# Patient Record
Sex: Female | Born: 1950 | Race: White | Hispanic: No | Smoking: Former smoker
Health system: Southern US, Community
[De-identification: ages and names within clinical notes are randomized; demographics above are authoritative.]

## PROBLEM LIST (undated history)

## (undated) DIAGNOSIS — G4700 Insomnia, unspecified: Secondary | ICD-10-CM

## (undated) DIAGNOSIS — I2699 Other pulmonary embolism without acute cor pulmonale: Secondary | ICD-10-CM

## (undated) DIAGNOSIS — K219 Gastro-esophageal reflux disease without esophagitis: Secondary | ICD-10-CM

## (undated) DIAGNOSIS — F419 Anxiety disorder, unspecified: Secondary | ICD-10-CM

## (undated) DIAGNOSIS — F32A Depression, unspecified: Secondary | ICD-10-CM

## (undated) DIAGNOSIS — H9319 Tinnitus, unspecified ear: Secondary | ICD-10-CM

## (undated) DIAGNOSIS — R42 Dizziness and giddiness: Secondary | ICD-10-CM

## (undated) DIAGNOSIS — I712 Thoracic aortic aneurysm, without rupture, unspecified: Secondary | ICD-10-CM

## (undated) DIAGNOSIS — I10 Essential (primary) hypertension: Secondary | ICD-10-CM

## (undated) DIAGNOSIS — J449 Chronic obstructive pulmonary disease, unspecified: Secondary | ICD-10-CM

## (undated) DIAGNOSIS — K2980 Duodenitis without bleeding: Secondary | ICD-10-CM

## (undated) DIAGNOSIS — N189 Chronic kidney disease, unspecified: Secondary | ICD-10-CM

## (undated) DIAGNOSIS — M1990 Unspecified osteoarthritis, unspecified site: Secondary | ICD-10-CM

## (undated) DIAGNOSIS — I639 Cerebral infarction, unspecified: Secondary | ICD-10-CM

## (undated) DIAGNOSIS — E7800 Pure hypercholesterolemia, unspecified: Secondary | ICD-10-CM

## (undated) DIAGNOSIS — K5730 Diverticulosis of large intestine without perforation or abscess without bleeding: Secondary | ICD-10-CM

## (undated) DIAGNOSIS — F329 Major depressive disorder, single episode, unspecified: Secondary | ICD-10-CM

## (undated) DIAGNOSIS — I2510 Atherosclerotic heart disease of native coronary artery without angina pectoris: Secondary | ICD-10-CM

## (undated) HISTORY — DX: Tinnitus, unspecified ear: H93.19

## (undated) HISTORY — PX: CHOLECYSTECTOMY: SHX55

## (undated) HISTORY — DX: Insomnia, unspecified: G47.00

## (undated) HISTORY — DX: Other pulmonary embolism without acute cor pulmonale: I26.99

## (undated) HISTORY — PX: TUBAL LIGATION: SHX77

## (undated) HISTORY — DX: Gastro-esophageal reflux disease without esophagitis: K21.9

## (undated) HISTORY — DX: Thoracic aortic aneurysm, without rupture, unspecified: I71.20

## (undated) HISTORY — DX: Major depressive disorder, single episode, unspecified: F32.9

## (undated) HISTORY — DX: Chronic kidney disease, unspecified: N18.9

## (undated) HISTORY — DX: Anxiety disorder, unspecified: F41.9

## (undated) HISTORY — DX: Chronic obstructive pulmonary disease, unspecified: J44.9

## (undated) HISTORY — PX: ANGIOPLASTY: SHX39

## (undated) HISTORY — DX: Diverticulosis of large intestine without perforation or abscess without bleeding: K57.30

## (undated) HISTORY — DX: Essential (primary) hypertension: I10

## (undated) HISTORY — DX: Depression, unspecified: F32.A

## (undated) HISTORY — DX: Dizziness and giddiness: R42

## (undated) HISTORY — DX: Cerebral infarction, unspecified: I63.9

## (undated) HISTORY — DX: Thoracic aortic aneurysm, without rupture: I71.2

## (undated) HISTORY — DX: Duodenitis without bleeding: K29.80

## (undated) HISTORY — DX: Pure hypercholesterolemia, unspecified: E78.00

## (undated) HISTORY — DX: Atherosclerotic heart disease of native coronary artery without angina pectoris: I25.10

## (undated) HISTORY — DX: Unspecified osteoarthritis, unspecified site: M19.90

---

## 2000-05-21 ENCOUNTER — Ambulatory Visit (HOSPITAL_COMMUNITY): Admission: RE | Admit: 2000-05-21 | Discharge: 2000-05-21 | Payer: Self-pay

## 2000-05-21 ENCOUNTER — Encounter: Payer: Self-pay | Admitting: Family Medicine

## 2000-07-20 ENCOUNTER — Encounter: Payer: Self-pay | Admitting: Gastroenterology

## 2000-09-30 ENCOUNTER — Emergency Department (HOSPITAL_COMMUNITY): Admission: EM | Admit: 2000-09-30 | Discharge: 2000-09-30 | Payer: Self-pay | Admitting: Emergency Medicine

## 2000-09-30 ENCOUNTER — Encounter: Payer: Self-pay | Admitting: Emergency Medicine

## 2001-10-19 ENCOUNTER — Other Ambulatory Visit: Admission: RE | Admit: 2001-10-19 | Discharge: 2001-10-19 | Payer: Self-pay | Admitting: Family Medicine

## 2001-10-22 ENCOUNTER — Ambulatory Visit (HOSPITAL_COMMUNITY): Admission: RE | Admit: 2001-10-22 | Discharge: 2001-10-22 | Payer: Self-pay | Admitting: Family Medicine

## 2001-10-22 ENCOUNTER — Encounter: Payer: Self-pay | Admitting: Family Medicine

## 2002-11-11 ENCOUNTER — Encounter: Payer: Self-pay | Admitting: Family Medicine

## 2002-11-11 ENCOUNTER — Ambulatory Visit (HOSPITAL_COMMUNITY): Admission: RE | Admit: 2002-11-11 | Discharge: 2002-11-11 | Payer: Self-pay | Admitting: Family Medicine

## 2003-06-30 ENCOUNTER — Emergency Department (HOSPITAL_COMMUNITY): Admission: EM | Admit: 2003-06-30 | Discharge: 2003-07-01 | Payer: Self-pay | Admitting: Emergency Medicine

## 2003-07-27 ENCOUNTER — Encounter: Admission: RE | Admit: 2003-07-27 | Discharge: 2003-07-27 | Payer: Self-pay | Admitting: Otolaryngology

## 2003-12-15 ENCOUNTER — Ambulatory Visit (HOSPITAL_COMMUNITY): Admission: RE | Admit: 2003-12-15 | Discharge: 2003-12-15 | Payer: Self-pay | Admitting: Family Medicine

## 2004-04-16 ENCOUNTER — Ambulatory Visit: Payer: Self-pay | Admitting: Psychiatry

## 2004-07-03 ENCOUNTER — Ambulatory Visit: Payer: Self-pay | Admitting: Psychology

## 2004-09-05 ENCOUNTER — Ambulatory Visit: Payer: Self-pay | Admitting: Psychiatry

## 2004-10-10 ENCOUNTER — Ambulatory Visit: Payer: Self-pay | Admitting: Psychology

## 2004-12-12 ENCOUNTER — Ambulatory Visit: Payer: Self-pay | Admitting: Psychiatry

## 2005-01-15 ENCOUNTER — Ambulatory Visit: Payer: Self-pay | Admitting: Cardiology

## 2005-01-15 ENCOUNTER — Ambulatory Visit (HOSPITAL_COMMUNITY): Admission: RE | Admit: 2005-01-15 | Discharge: 2005-01-15 | Payer: Self-pay | Admitting: Nurse Practitioner

## 2005-01-27 ENCOUNTER — Ambulatory Visit: Payer: Self-pay

## 2005-01-27 ENCOUNTER — Ambulatory Visit: Payer: Self-pay | Admitting: Cardiology

## 2005-02-06 ENCOUNTER — Ambulatory Visit: Payer: Self-pay | Admitting: Psychiatry

## 2005-02-28 ENCOUNTER — Ambulatory Visit: Payer: Self-pay | Admitting: Psychiatry

## 2005-04-16 ENCOUNTER — Ambulatory Visit: Payer: Self-pay | Admitting: Psychology

## 2005-06-12 ENCOUNTER — Encounter: Admission: RE | Admit: 2005-06-12 | Discharge: 2005-06-12 | Payer: Self-pay | Admitting: Neurology

## 2005-06-26 ENCOUNTER — Ambulatory Visit: Payer: Self-pay | Admitting: Psychiatry

## 2005-07-07 ENCOUNTER — Ambulatory Visit: Payer: Self-pay | Admitting: Psychology

## 2005-08-08 ENCOUNTER — Encounter: Admission: RE | Admit: 2005-08-08 | Discharge: 2005-11-06 | Payer: Self-pay | Admitting: Family Medicine

## 2005-09-02 ENCOUNTER — Ambulatory Visit: Payer: Self-pay | Admitting: Psychiatry

## 2005-11-27 ENCOUNTER — Ambulatory Visit (HOSPITAL_COMMUNITY): Payer: Self-pay | Admitting: Psychiatry

## 2006-01-29 ENCOUNTER — Ambulatory Visit (HOSPITAL_COMMUNITY): Admission: RE | Admit: 2006-01-29 | Discharge: 2006-01-29 | Payer: Self-pay | Admitting: Nurse Practitioner

## 2006-02-26 ENCOUNTER — Ambulatory Visit (HOSPITAL_COMMUNITY): Payer: Self-pay | Admitting: Psychiatry

## 2006-05-26 ENCOUNTER — Ambulatory Visit (HOSPITAL_COMMUNITY): Payer: Self-pay | Admitting: Psychiatry

## 2006-08-25 ENCOUNTER — Ambulatory Visit (HOSPITAL_COMMUNITY): Payer: Self-pay | Admitting: Psychiatry

## 2006-11-24 ENCOUNTER — Ambulatory Visit (HOSPITAL_COMMUNITY): Payer: Self-pay | Admitting: Psychiatry

## 2006-12-21 ENCOUNTER — Inpatient Hospital Stay (HOSPITAL_COMMUNITY): Admission: EM | Admit: 2006-12-21 | Discharge: 2006-12-24 | Payer: Self-pay | Admitting: Emergency Medicine

## 2006-12-21 ENCOUNTER — Ambulatory Visit: Payer: Self-pay | Admitting: Cardiology

## 2006-12-22 ENCOUNTER — Encounter (INDEPENDENT_AMBULATORY_CARE_PROVIDER_SITE_OTHER): Payer: Self-pay | Admitting: Cardiovascular Disease

## 2007-01-25 ENCOUNTER — Ambulatory Visit: Payer: Self-pay | Admitting: Cardiology

## 2007-01-26 ENCOUNTER — Ambulatory Visit: Payer: Self-pay | Admitting: Gastroenterology

## 2007-01-27 ENCOUNTER — Encounter: Payer: Self-pay | Admitting: Gastroenterology

## 2007-01-27 ENCOUNTER — Ambulatory Visit: Payer: Self-pay | Admitting: Gastroenterology

## 2007-02-02 ENCOUNTER — Ambulatory Visit: Payer: Self-pay | Admitting: Gastroenterology

## 2007-02-08 ENCOUNTER — Ambulatory Visit (HOSPITAL_COMMUNITY): Admission: RE | Admit: 2007-02-08 | Discharge: 2007-02-08 | Payer: Self-pay | Admitting: Family Medicine

## 2007-02-09 ENCOUNTER — Ambulatory Visit (HOSPITAL_COMMUNITY): Payer: Self-pay | Admitting: Psychiatry

## 2007-06-01 ENCOUNTER — Ambulatory Visit (HOSPITAL_COMMUNITY): Payer: Self-pay | Admitting: Psychiatry

## 2007-06-16 ENCOUNTER — Encounter: Admission: RE | Admit: 2007-06-16 | Discharge: 2007-09-14 | Payer: Self-pay | Admitting: Anesthesiology

## 2007-07-16 ENCOUNTER — Ambulatory Visit: Payer: Self-pay | Admitting: Cardiology

## 2007-08-31 ENCOUNTER — Ambulatory Visit (HOSPITAL_COMMUNITY): Payer: Self-pay | Admitting: Psychiatry

## 2007-11-16 ENCOUNTER — Ambulatory Visit (HOSPITAL_COMMUNITY): Payer: Self-pay | Admitting: Psychiatry

## 2007-12-11 DIAGNOSIS — E785 Hyperlipidemia, unspecified: Secondary | ICD-10-CM

## 2007-12-11 DIAGNOSIS — F17200 Nicotine dependence, unspecified, uncomplicated: Secondary | ICD-10-CM | POA: Insufficient documentation

## 2007-12-11 DIAGNOSIS — I2510 Atherosclerotic heart disease of native coronary artery without angina pectoris: Secondary | ICD-10-CM

## 2007-12-11 DIAGNOSIS — K2981 Duodenitis with bleeding: Secondary | ICD-10-CM

## 2007-12-11 DIAGNOSIS — F329 Major depressive disorder, single episode, unspecified: Secondary | ICD-10-CM

## 2007-12-11 DIAGNOSIS — I10 Essential (primary) hypertension: Secondary | ICD-10-CM

## 2008-02-15 ENCOUNTER — Ambulatory Visit (HOSPITAL_COMMUNITY): Payer: Self-pay | Admitting: Psychiatry

## 2008-02-15 ENCOUNTER — Ambulatory Visit (HOSPITAL_COMMUNITY): Admission: RE | Admit: 2008-02-15 | Discharge: 2008-02-15 | Payer: Self-pay | Admitting: Obstetrics and Gynecology

## 2008-06-06 ENCOUNTER — Ambulatory Visit (HOSPITAL_COMMUNITY): Payer: Self-pay | Admitting: Psychiatry

## 2008-06-20 ENCOUNTER — Ambulatory Visit: Payer: Self-pay | Admitting: Gastroenterology

## 2008-06-20 DIAGNOSIS — Z9089 Acquired absence of other organs: Secondary | ICD-10-CM

## 2008-06-20 DIAGNOSIS — R079 Chest pain, unspecified: Secondary | ICD-10-CM

## 2008-06-20 DIAGNOSIS — K219 Gastro-esophageal reflux disease without esophagitis: Secondary | ICD-10-CM

## 2008-07-20 ENCOUNTER — Ambulatory Visit: Payer: Self-pay | Admitting: Gastroenterology

## 2008-07-31 ENCOUNTER — Telehealth (INDEPENDENT_AMBULATORY_CARE_PROVIDER_SITE_OTHER): Payer: Self-pay | Admitting: *Deleted

## 2008-08-21 ENCOUNTER — Ambulatory Visit: Payer: Self-pay | Admitting: Gastroenterology

## 2008-09-05 ENCOUNTER — Ambulatory Visit (HOSPITAL_COMMUNITY): Payer: Self-pay | Admitting: Psychiatry

## 2008-09-19 ENCOUNTER — Ambulatory Visit: Payer: Self-pay | Admitting: Cardiology

## 2008-09-21 ENCOUNTER — Ambulatory Visit: Payer: Self-pay

## 2008-12-07 ENCOUNTER — Ambulatory Visit (HOSPITAL_COMMUNITY): Payer: Self-pay | Admitting: Psychiatry

## 2008-12-20 ENCOUNTER — Telehealth (INDEPENDENT_AMBULATORY_CARE_PROVIDER_SITE_OTHER): Payer: Self-pay | Admitting: *Deleted

## 2009-02-01 ENCOUNTER — Ambulatory Visit (HOSPITAL_COMMUNITY): Admission: RE | Admit: 2009-02-01 | Discharge: 2009-02-01 | Payer: Self-pay | Admitting: General Surgery

## 2009-02-01 ENCOUNTER — Encounter (INDEPENDENT_AMBULATORY_CARE_PROVIDER_SITE_OTHER): Payer: Self-pay | Admitting: General Surgery

## 2009-02-02 ENCOUNTER — Telehealth: Payer: Self-pay | Admitting: Cardiology

## 2009-02-16 ENCOUNTER — Ambulatory Visit (HOSPITAL_COMMUNITY): Admission: RE | Admit: 2009-02-16 | Discharge: 2009-02-16 | Payer: Self-pay | Admitting: Obstetrics and Gynecology

## 2009-03-06 ENCOUNTER — Ambulatory Visit (HOSPITAL_COMMUNITY): Payer: Self-pay | Admitting: Psychiatry

## 2009-06-19 ENCOUNTER — Encounter: Payer: Self-pay | Admitting: Cardiology

## 2009-06-28 ENCOUNTER — Ambulatory Visit (HOSPITAL_COMMUNITY): Payer: Self-pay | Admitting: Psychiatry

## 2009-07-02 ENCOUNTER — Encounter (INDEPENDENT_AMBULATORY_CARE_PROVIDER_SITE_OTHER): Payer: Self-pay | Admitting: *Deleted

## 2009-09-27 ENCOUNTER — Ambulatory Visit (HOSPITAL_COMMUNITY): Payer: Self-pay | Admitting: Psychiatry

## 2009-10-29 ENCOUNTER — Ambulatory Visit: Payer: Self-pay | Admitting: Cardiology

## 2009-10-29 DIAGNOSIS — E663 Overweight: Secondary | ICD-10-CM

## 2009-12-20 ENCOUNTER — Ambulatory Visit (HOSPITAL_COMMUNITY): Payer: Self-pay | Admitting: Psychiatry

## 2010-02-18 ENCOUNTER — Ambulatory Visit (HOSPITAL_COMMUNITY): Admission: RE | Admit: 2010-02-18 | Discharge: 2010-02-18 | Payer: Self-pay | Admitting: Obstetrics and Gynecology

## 2010-03-19 ENCOUNTER — Ambulatory Visit (HOSPITAL_COMMUNITY): Payer: Self-pay | Admitting: Psychiatry

## 2010-04-18 ENCOUNTER — Ambulatory Visit (HOSPITAL_COMMUNITY): Payer: Self-pay | Admitting: Psychiatry

## 2010-07-23 ENCOUNTER — Ambulatory Visit (HOSPITAL_COMMUNITY): Payer: Self-pay | Admitting: Psychiatry

## 2010-09-01 ENCOUNTER — Encounter: Payer: Self-pay | Admitting: Family Medicine

## 2010-09-10 NOTE — Assessment & Plan Note (Signed)
Summary: F1Y   Visit Type:  Follow-up Primary Provider:  Mary Beth Dixon PAc  CC:  CAD and HTN.  History of Present Illness: The patient presents for one year followup. Since I last saw her she stopped smoking! She has had no new cardiovascular complaints. She was exercising routinely but stopped this when she had some family issues. She's just started getting back into walking and riding her stationary bicycle. With her levels of activity she does not have chest pressure, neck or arm discomfort. She does not have palpitations, presyncope or syncope. She does not have any PND or orthopnea. She thinks her breathing has gotten better off of the cigarettes.  Problems Prior to Update: 1)  Family Hx of Colonic Polyps  (ICD-V18.51) 2)  Cholecystectomy, Hx of  (ICD-V45.79) 3)  Gerd  (ICD-530.81) 4)  Chest Pain  (ICD-786.50) 5)  Hyperlipidemia  (ICD-272.4) 6)  Duodenitis, With Hemorrhage  (ICD-535.61) 7)  Tobacco Abuse  (ICD-305.1) 8)  Depression  (ICD-311) 9)  Hypertension  (ICD-401.9) 10)  Coronary Artery Disease  (ICD-414.00)  Current Medications (verified): 1)  Allegra 180 Mg Tabs (Fexofenadine Hcl) .... Take One By Mouth Once Daily 2)  Adult Aspirin Ec Low Strength 81 Mg Tbec (Aspirin) .... Take One By Mouth Once Daily 3)  Cartia Xt 120 Mg Xr24h-Cap (Diltiazem Hcl Coated Beads) .... Take One By Mouth Once Daily 4)  Simvastatin 40 Mg Tabs (Simvastatin) .... Take One By Mouth Once Daily 5)  Requip 1 Mg Tabs (Ropinirole Hcl) .... Take One By Mouth Once Daily 6)  Ambien Cr 12.5 Mg Cr-Tabs (Zolpidem Tartrate) .... Take One By Mouth At Bedtime 7)  Benazepril Hcl 40 Mg Tabs (Benazepril Hcl) .... Take One By Mouth Once Daily 8)  Hydrochlorothiazide 25 Mg Tabs (Hydrochlorothiazide) .Marland Kitchen.. 1 By Mouth Once Daily  Allergies: 1)  ! Morphine 2)  ! * Latex 3)  ! Adhesive Tape  Past History:  Past Medical History: HYPERLIPIDEMIA (ICD-272.4) DUODENITIS, WITH HEMORRHAGE (ICD-535.61) TOBACCO ABUSE  (ICD-305.1) DEPRESSION (ICD-311) HYPERTENSION (ICD-401.9) CORONARY ARTERY DISEASE (ICD-414.00)left main   normal, LAD 20% proximal and mid stenosis, circumflex 20% proximal and   distal stenosis, right coronary artery 50-60% stenosis in the mid   portion.  The EF of 65%.)  Past Surgical History: Cholecystecomy Tubal ligation  Review of Systems       As stated in the HPI and negative for all other systems.   Vital Signs:  Patient profile:   60 year old female Height:      64 inches Weight:      188 pounds BMI:     32.39 Pulse rate:   83 / minute BP sitting:   134 / 68  (left arm)  Vitals Entered By: Oswald Hillock (October 29, 2009 2:13 PM)  Physical Exam  General:  Well developed, well nourished, in no acute distress. Head:  normocephalic and atraumatic Eyes:  PERRLA/EOM intact; conjunctiva and lids normal. Mouth:  Teeth, gums and palate normal. Oral mucosa normal. Neck:  Neck supple, no JVD. No masses, thyromegaly or abnormal cervical nodes. Chest Wall:  no deformities or breast masses noted Lungs:  Clear bilaterally to auscultation and percussion. Heart:  Non-displaced PMI, chest non-tender; regular rate and rhythm, S1, S2 without murmurs, rubs or gallops. Carotid upstroke normal, no bruit. Normal abdominal aortic size, no bruits. Femorals normal pulses, no bruits. Pedals normal pulses. No edema, no varicosities. Abdomen:  Bowel sounds positive; abdomen soft and non-tender without masses, organomegaly, or hernias noted. No  hepatosplenomegaly. Msk:  Back normal, normal gait. Muscle strength and tone normal. Extremities:  No clubbing or cyanosis. Neurologic:  Alert and oriented x 3. Skin:  Intact without lesions or rashes. Cervical Nodes:  no significant adenopathy Axillary Nodes:  no significant adenopathy Inguinal Nodes:  no significant adenopathy Psych:  Normal affect.   EKG  Procedure date:  10/29/2009  Findings:      sinus rhythm, rate 83, axis within  normal limits, intervals within normal limits, no acute ST-T wave changes.  Impression & Recommendations:  Problem # 1:  CORONARY ARTERY DISEASE (ICD-414.00) The patient has nonobstructive disease. She's had no new symptoms. She's participating in risk reduction. No further testing is indicated at this point. I suggested that I might see her in 2 years and do a routine exercise treadmill test or sooner if she has any symptoms. Orders: EKG w/ Interpretation (93000)  Problem # 2:  HYPERLIPIDEMIA (ICD-272.4) I reviewed her lipid profile from November of last year with an HDL of 55 and an LDL of 61. This is excellent and she will continue the meds as listed.  Problem # 3:  OVERWEIGHT (ICD-278.02) I suggested plans for weight loss.  Problem # 4:  HYPERTENSION (ICD-401.9) Her blood pressure is controlled on the meds as listed. No change in therapy is indicated.  Patient Instructions: 1)  Your physician recommends that you schedule a follow-up appointment in: 2 years with Dr Antoine Poche 2)  Your physician recommends that you continue on your current medications as directed. Please refer to the Current Medication list given to you today.

## 2010-10-22 ENCOUNTER — Encounter (HOSPITAL_COMMUNITY): Payer: Medicare Other | Admitting: Psychiatry

## 2010-11-18 LAB — BASIC METABOLIC PANEL
BUN: 16 mg/dL (ref 6–23)
CO2: 30 mEq/L (ref 19–32)
Calcium: 10 mg/dL (ref 8.4–10.5)
GFR calc non Af Amer: 47 mL/min — ABNORMAL LOW (ref >60)
Glucose, Bld: 127 mg/dL — ABNORMAL HIGH (ref 70–99)
Potassium: 4.7 mEq/L (ref 3.5–5.1)

## 2010-11-18 LAB — DIFFERENTIAL
Basophils Relative: 1 % (ref 0–1)
Eosinophils Absolute: 0.5 10*3/uL (ref 0.0–0.7)
Lymphocytes Relative: 46 % (ref 12–46)
Monocytes Absolute: 0.9 10*3/uL (ref 0.1–1.0)
Monocytes Relative: 8 % (ref 3–12)
Neutro Abs: 4.5 10*3/uL (ref 1.7–7.7)
Neutrophils Relative %: 40 % — ABNORMAL LOW (ref 43–77)

## 2010-11-18 LAB — CBC: RBC: 4.58 MIL/uL (ref 3.87–5.11)

## 2010-12-09 ENCOUNTER — Other Ambulatory Visit: Payer: Self-pay | Admitting: Family Medicine

## 2010-12-09 ENCOUNTER — Ambulatory Visit
Admission: RE | Admit: 2010-12-09 | Discharge: 2010-12-09 | Disposition: A | Discharge status: Home / Self Care | Payer: Medicare Other | Source: Ambulatory Visit | Attending: Family Medicine | Admitting: Family Medicine

## 2010-12-09 DIAGNOSIS — M545 Low back pain, unspecified: Secondary | ICD-10-CM

## 2010-12-24 NOTE — Assessment & Plan Note (Signed)
[facility]                            CARDIOLOGY OFFICE NOTE   NAME:[name] [name], [name] [name]                      MRN:          [id number]  DATE:01/25/2007                            DOB:          23-Oct-1950    PRIMARY CARE PHYSICIAN:  Dionicio Stall, P.A. at [facility]   REASON FOR PRESENTATION:  Evaluate patient with chest pain on recent  catheterization.   HISTORY OF PRESENT ILLNESS:  The patient was hospitalized on May 12,  with chest discomfort. She ruled out for myocardial infarction. However,  because of the nature of the discomfort and risk factors, she did have a  cardiac catheterization. This demonstrated normal left main, LAD at 20%  proximal and mid stenosis, the circumflex had 20% proximal and distal  stenosis, the right coronary artery had a 50-60% stenosis in the mid  portion. There was thought to be some mild coronary spasm. The EF was  65%. The patient was managed medically and instructed to stop smoking.  Unfortunately, she did not quit smoking and still smoking a pack and a  half a day. She has had no further chest discomfort. She was nauseated  for about a week after the catheterization. She occasionally is getting  some chest fluttering. However, she is not having any pre-syncope or  syncope. She does activities such as vacuuming and working in her  garden, which she has done since the catheterization and has not been  able to bring on any chest pressure, neck or arm discomfort. She has had  no new shortness of breath, denies any PND or orthopnea.   PAST MEDICAL HISTORY:  1. Hypertension.  2. Nonobstructive coronary disease as described.  3. Tobacco abuse.  4. Depression.  5. Insomnia.  6. Cholecystectomy.  7. Tubal ligation.   ALLERGIES:  LATEX, GUAIFENESIN.   MEDICATIONS:  1. Effexor 75 mg daily.  2. Cymbalta 60 mg daily.  3. Allegra 180 mg daily.  4. Aspirin 81 mg daily.  5. Cartia 120 mg daily.  6.  Benazepril 10 mg daily.  7. Protonix 40 mg daily.  8. Simvastatin 40 mg daily.  9. Requip.  10.Ambien.   REVIEW OF SYSTEMS:  As stated in the HPI and otherwise negative for  other systems.   PHYSICAL EXAMINATION:  The patient is in no distress. Blood pressure is  157/81, heart rate 85 and regular, weight 181 pounds, Body Mass Index is  30.  HEENT: Eyelids unremarkable. Pupils equal, round, and reactive to light.  Fundi not visualized. Oral mucosa is unremarkable.  NECK: No jugular venous distention at 45 degrees.  Carotid upstroke  brisk and symmetrical. No bruits, no thyromegaly.  LYMPHATICS: No cervical, axillary or inguinal adenopathy.  LUNGS: Clear to auscultation bilaterally.  BACK: No costovertebral angle tenderness.  CHEST: Unremarkable.  HEART: PMI not displaced or sustained. S1, S2 within normal limits. No  S3. No S4. No clicks, rub or murmurs.  ABDOMEN: Flat, positive bowel sounds, normal in frequency and pitch. No  bruits. No rebound. No guarding. No midline pulsatile mass. No  hepatomegaly, splenomegaly.  SKIN:  No rashes, no nodules.  EXTREMITIES: 2+ pulses. Right groin without hematoma, ecchymosis or  bruit.  NEURO: Oriented to person, place and time. Cranial nerves II-XII grossly  intact. Motor grossly intact throughout.   EKG:  Sinus rhythm, rate 87, rightward axis, poor anterior R-wave  progression suggestive of an old anterior septal myocardial infarction.  No acute ST wave changes.   ASSESSMENT/PLAN:  1. Nonobstructive coronary disease. The patient is not having any new      symptoms. She will continue with aggressive risk reduction. No      further cardiovascular testing is suggested.  2. Tobacco: I discussed the need to stop smoking (less 10 minutes). I      did give her a prescription for Chantix. We discussed the black box      warning of suicidal ideation. We discussed other side effects. I      ran all of her medications against the Chantix and found  no drug      interaction. She was given a starter prescription for this.  3. Hypertension. Blood pressure is slightly elevated today. She      recently had benazepril added. I have asked her to keep a blood      pressure diary which she says that her blood pressures at home have      been in the 130s. She is going to take that diary to her primary      care physician for further medication adjustments. She should also      lose weight to help bring her blood pressure to target.  4. Followup. Because of the above events, I would like to see her      again in 6 months or sooner if needed.     Rollene Rotunda, MD, [facility]  Electronically Signed    JH/MedQ  DD: 01/25/2007  DT: 01/25/2007  Job #: [id number]   cc:   Dionicio Stall, P.A.

## 2010-12-24 NOTE — Consult Note (Signed)
NAMELAURELL, [name] [name]               ACCOUNT NO.:  [id number]   MEDICAL RECORD NO.:  [id number]          PATIENT TYPE:  INP   LOCATION:  4733                         FACILITY:  [facility]   PHYSICIAN:  Rollene Rotunda, MD, FACCDATE OF BIRTH:  [birth year]   DATE OF CONSULTATION:  12/22/2006  DATE OF DISCHARGE:                                 CONSULTATION   PRIMARY CARDIOLOGIST:  Dr. Rollene Rotunda.   PRIMARY CARE Maiyah Goyne:  Nadara Mustard, Nurse Practitioner at [facility].   REASON FOR CONSULTATION:  Chest pain.   HISTORY OF PRESENT ILLNESS:  [age]-year-old Caucasian female with  no prior history of coronary artery disease, presented to her primary  care physician's office at [facility] on Dec 21, 2006,  secondary to heaviness in her chest with associated shortness of breath  and nausea.  The patient was also complaining of being hot, then cold.  The patient states the symptoms actually began 2 days prior with some  low-grade nausea and burning in her chest and awoke the morning of Dec 21, 2006 around 7 a.m. with some tightness in her chest, radiating to  her neck.  Her states, around 10 a.m., she began to have trouble taking  deep breaths.  She was not short of breath with activity, only when she  tried to take a deep breath it was difficult for her.  She had not  experienced this difficulty before.  She called her primary care  [facility] Penton's office, she was seen there and an EKG was completed.  She was  given sublingual nitroglycerin and baby aspirin and EMS was called.  EMS  did give her an additional nitroglycerin in route and the pain  diminished.  The pain also was further alleviated on arrival to the  emergency room with use of another sublingual nitroglycerin.  The  patient was admitted by incompass and cardiac enzymes were cycled.  EKG  revealed normal sinus rhythm with nonspecific anterior T wave changes.  The patient's initial point of  care markers were negative.   On today's evaluation, the patient continues to have some occasional  chest discomfort, although not severe.  The patient's troponins were  found to be negative x2.  The patient has just returned from receiving  echocardiogram with results pending.   REVIEW OF SYSTEMS:  All other systems reviewed and are found to be  negative.   PAST MEDICAL HISTORY:  Includes:  1. Hypercholesterolemia.  2. Chronic back pain.  3. Hypertension.  4. Depression.  5. Insomnia.  6. Borderline diabetes.   PAST CARDIAC WORKUP:  The patient was seen by Dr. Rollene Rotunda on June  of 2006 at the request of Nadara Mustard, family nurse practitioner,  secondary to palpitations.  The patient did receive a stress Myoview  revealing no chest pain, but positive electrocardiographic response.  She was found to have some soft tissue attenuation versus prior septal  infarct.  There was no ischemia noted on the study.  Her gated ejection  fraction was 72% with normal wall motion.   PAST SURGICAL HISTORY:  She  had:  1. A cholecystectomy.  2. Bilateral tubal ligation.   SOCIAL HISTORY:  She lives in [place].  She is 1 and 1-1/2 pack a  day smoker x20 years.  Negative for ETOH.  Negative for drug use.  Exercises very little.  She is not on a special diet; however, she does  watch her carbohydrates as she has been told she was a diabetic,  borderline.   FAMILY HISTORY:  Mother died at age 62 with complications from diabetes,  hypertension.  She also had rheumatoid arthritis.  Father died at age 28  and had been diagnosed with CAD in his 42s.   CURRENT MEDICATIONS:  1. Norvasc 2.5 mg once a day.  2. Lopressor 12.5 mg b.i.d.  3. Zocor 20 mg daily.  4. Claritin 10 mg daily.  5. Cymbalta 60 mg daily.  6. Lovenox 40 mg daily.  7. Aspirin 325 daily.  8. Protonix 40 mg daily.  9. Albuterol 2 puffs q.i.d.  10.Senokot q.h.s.  11.Requip 1 mg daily.   ALLERGIES:  TO:  1.  GUAIFENESIN.  2. LATEX POWDER.   CURRENT LABS:  Hemoglobin 15.6, hematocrit 46.0, white blood cell is  8.4, platelets 356.  Sodium 137, potassium 3.9, chloride of 105, CO2  40.8, BUN 10, creatinine 0.8, glucose 134.  TSH 1.060.  D-dimer negative  at 0.73.  BNP less than 30.0.  Hemoglobin A1c 6.5.  Troponin 0.01 and  0.02 respectively.  Cholesterol 104, triglycerides 97, HDL 35, LDL 50.  Chest x-ray revealing low lung volumes with bibasilar subsegmental  atelectasis.  CT scan of the chest was negative for PE, but revealed  right middle lobe atelectasis.  EKG revealing normal sinus rhythm with  78 beats per minute with a right axis deviation and nonspecific T wave  abnormalities noted in V1 and V2.   PHYSICAL EXAMINATION:  VITAL SIGNS:  Blood pressure 127/66.  Pulse 69.  Respirations 20.  Temperature 97.8.  T-Max is 98.3.  Saturation 97% on 2  liters.  Weight 189.5 pounds.  HEENT:  Head is normocephalic, atraumatic.  Eyes:  PERRLA.  Mucous  membranes and mouth pink and moist.  Tongue is midline.  NECK:  Supple.  There was no JVD.  There are carotid bruits noted on the  right.  There is no JVD.  CARDIOVASCULAR:  Regular rate and rhythm with 1/6 systolic murmur  auscultated.  Pulses are 2+ bilaterally, radially and dorsalis pedis  pulses.  LUNGS:  Essentially clear to auscultation.  ABDOMEN:  Soft.  There is some mild left lower quadrant tenderness to  palpation.  EXTREMITIES:  Without clubbing, cyanosis or edema.  NEURO:  Cranial nerves II-XII are grossly intact with equal strength in  all extremities.   CARDIOVASCULAR RISK FACTORS:  Tobacco abuse, hypertension,  hypercholesterolemia and family history.   ASSESSMENT/PLAN:  1. Atypical chest pain.  2. Hypercholesterolemia by history.  3. Tobacco abuse.  4. Hypertension, well controlled.   PLAN:  The patient has been examined by myself and Dr. Valera Castle. Signs and symptoms consistent with unstable angina, yet has no  myocardial  infarction or positive cardiac enzymes despite chest pain and  symptoms all day and negative EKG with evidence to be within normal  limits.  However, with right carotid bruit, tobacco and elevated lipids  and diabetes mellitus x2, the patient needs a cath to rule out  obstructive coronary artery disease and outpatient carotid Dopplers.   This has been discussed thorough with the patient to  include risks and  benefits per Dr. Daleen Squibb.  The patient agrees to proceed.  We will schedule  cardiac catheterization the morning of Dec 23, 2006 and make further  recommendations throughout hospital course depending upon results.  Carotid Dopplers can be completed as an outpatient.      Bettey Mare. Lyman Bishop, NP      Rollene Rotunda, MD, [facility]  Electronically Signed    KML/MEDQ  D:  12/22/2006  T:  12/22/2006  Job:  [id number]   cc:   Nadara Mustard, [facility]

## 2010-12-24 NOTE — Discharge Summary (Signed)
[name] [name]               ACCOUNT NO.:  [id number]   MEDICAL RECORD NO.:  [id number]          PATIENT TYPE:  INP   LOCATION:  4733                         FACILITY:  [facility]   PHYSICIAN:  Isidor Holts, M.D.  DATE OF BIRTH:  May 26, 1951   DATE OF ADMISSION:  12/21/2006  DATE OF DISCHARGE:  12/24/2006                         DISCHARGE SUMMARY - REFERRING   PRIMARY MEDICAL DOCTOR:  [facility].   NURSE PRACTITIONER:  Nadara Mustard.   DISCHARGE DIAGNOSES:  1. Chest pain/nonobstructive coronary artery disease.  2. Smoking history.  3. Hypertension.  4. Dyslipidemia.  5. Moderate obesity.   DISCHARGE MEDICATIONS:  1. Aspirin 325 mg p.o. daily.  2. Cardizem CD 120 mg p.o. daily.  3. Cymbalta 60 mg p.o. daily.  4. Ambien 10 mg p.o. p.r.n. nightly.  5. Zocor 20 mg p.o. daily.  6. Allegra 180 mg daily.  7. Requip 1 mg p.o. daily.   PROCEDURES:  1. Portable chest x-ray dated Dec 21, 2006, showed lower lung volumes      with bibasilar subsegmental atelectasis.  2. Chest CT angiogram dated Dec 21, 2006, showed no evidence for acute      pulmonary embolism.  There was right middle lobe atelectasis.  3. A 2-D echocardiogram  dated Dec 22, 2006, showed overall normal      left ventricular systolic function, LVEF 55%, no  left ventricular      regional wall motion abnormalities.  Doppler parameters were      consistent with elevated mean left atrial filling pressure,      estimated peak pulmonary artery pressure 30 mmHg.  4. Cardiac catheterization dated Dec 23, 2006, performed by Dr. Nona Dell, that showed predominantly mild coronary atherosclerosis      with moderate disease in the proximal right coronary artery over      approximately 50%.  No critical stenoses are noted.  LVEF was      approximately 65% with left ventricular end diastolic pressure 15      mmHg, no significant mitral regurgitation.   CONSULTATIONS:  1. Rollene Rotunda, MD,  [facility], cardiologist.  2. Jonelle Sidle, MD, cardiologist.   ADMISSION HISTORY:  As in H&P notes of Dec 21, 2006, dictated by Dr.  Hettie Holstein.  However, in brief, this is a 61 year old female with  known history of dyslipidemia, chronic back pain/DJD/radiculopathy,  depression, restless leg syndrome, smoking history, status post prior  cholecystectomy and bilateral tubal ligation who presents with chest  heaviness and shortness of breath.  She was seen at her primary care  physician's office, received sublingual nitroglycerin with relief, then  referred to the emergency department.  EKG revealed normal sinus rhythm,  age indeterminate anterior infarct, elevated D-dimer at 0.753.  She was  therefore admitted for further evaluation, investigation and management.   CLINICAL COURSE:  PROBLEM #1 -  CHEST PAIN:  Patient certainly has risk  factors for coronary artery disease, including obesity, hypertension,  dyslipidemia, smoking history and a positive family history of coronary  artery disease.  She was therefore a candidate for  further work-up.  Cardiac enzymes remained unelevated.  EKG showed no acute ischemic  changes.  Patient underwent chest CT angiogram which showed no evidence  of pulmonary embolism and no acute pulmonary pathology.  Cardiology  consultation was called, which was kindly provided by Dr. Rollene Rotunda, who arranged a cardiac catheterization on Dec 23, 2006, which  showed nonobstructive coronary artery disease, although there was  moderate disease in the proximal right coronary artery of approximately  50%.  It is postulated that patient may have been manifesting some  angina secondary to coronary artery spasm on the basis of mild to  moderate coronary atherosclerosis.  Medical management was recommended  for now and certainly smoking cessation.  Patient has therefore, been  placed on Aspirin and per cardiology recommendation, Cardizem CD. She  had no further  episodes of chest pain during the course of her  hospitalization.   PROBLEM #2 -  SMOKING HISTORY:  Patient has been counseled  appropriately.   PROBLEM #3 -  HYPERTENSION:  This remained controlled throughout  patient's hospitalization.   PROBLEM #4 -  DYSLIPIDEMIA:  Patient's lipid profile was as follows.  Total cholesterol 104, triglycerides 97, HDL 35, LDL 50.  TSH was 1.061.  ie, excellent lipid profile.  The patient continues on pre-admission  dosages of Zocor.   DISPOSITION:  Patient was considered sufficiently clinically stable and  asymptomatic to be discharged in satisfactory condition on Dec 24, 2006.  During the course of her hospitalization, there were no problems  referable to her restless leg syndrome, mood or chronic back pain.  She  is recommended heart-healthy diet, activity as tolerated.   FOLLOW UP INSTRUCTIONS:  She has been recommended follow-up with Dr.  Rollene Rotunda at the [place], office on January 25, 2007, at 2:30 p.m., telephone number [phone].  She is also to  follow up routinely with her primary M.D. per prior scheduled  appointment.   Note, Norvasc has been discontinued per cardiology recommendations.   All this has been communicated to the patient, who verbalized  understanding.      Isidor Holts, M.D.  Electronically Signed     CO/MEDQ  D:  12/24/2006  T:  12/24/2006  Job:  [id number]   cc:   Nadara Mustard, N.P.  Rollene Rotunda, MD, [facility]

## 2010-12-24 NOTE — H&P (Signed)
[name] [name], [name] [name]               ACCOUNT NO.:  [id number]   MEDICAL RECORD NO.:  [id number]          PATIENT TYPE:  INP   LOCATION:  4733                         FACILITY:  [facility]   PHYSICIAN:  Hettie Holstein, D.O.    DATE OF BIRTH:  1951/05/23   DATE OF ADMISSION:  12/21/2006  DATE OF DISCHARGE:                              HISTORY & PHYSICAL   PRIMARY CARE PHYSICIAN:  [facility].   CHIEF COMPLAINT:  Chest heaviness.   HISTORY OF PRESENTING ILLNESS:  [name] [name] is a pleasant, morbidly  obese Caucasian female who has a history of hyperlipidemia,  hypertension, is a positive smoker, and has positive coronary disease in  her family, who developed some indigestion yesterday.  This persisted,  and she awoke this morning with some chest heaviness and some septal  shortness of breath.  Not particularly associated with exertion.  She  went to [employer] today, and around 11 a.m. this became more noticeable,  and she went home and tried to rest.  This did not go away.  She went to  her primary physician's office.  Saw Cheron Every.  Received 2  nitroglycerin, with relief.  There was no radiation to her left arm, and  no jaw radiation.  She has been having hot and cold sweats, though this  is not new.  Apparently she had a stress test about 18 months ago at  [facility], for which I do not have the results at this time,  but she stated that this was a treadmill, and the study was negative.   EMERGENCY ROOM COURSE:  EKG in the emergency room revealed normal sinus  rhythm, anterior infarct, age indeterminate.  Her point of care marker  was negative.  D-dimer was elevated at 14, and underwent a CT scan to  rule out PE.  This was negative, as well.  There was revealed some right  middle lobe atelectasis.   PAST MEDICAL HISTORY:  Significant for:  1. Hyperlipidemia.  2. Status post cholecystectomy.  3. Bilateral tubal ligation.  4.  Chronic back pain and degenerative disk disease.  She states she      was told she has a pinched nerve.  Apparently she has undergone      some MRIs in the past.  5. History of vertigo.  6. History of insomnia.  7. Depression.  8. Restless legs syndrome.  9. Positive for smoking as well.   ALLERGIES:  1. GUAIFENESIN.  2. LATEX.   MEDICATIONS:  1. Cymbalta 60 mg daily.  2. Ambien 10 mg nightly.  3. Aspirin 325 mg daily.  4. Norvasc 5 mg daily.  5. Zocor 20 mg daily.  6. Allegra 180 mg daily.  7. Requip 1 mg p.o. every day.   SOCIAL HISTORY:  She does smoke sometimes greater than a pack of  cigarettes per day.  This is longstanding.  She denies alcohol.  She has  been married for 37 years.  Formerly worked as a Scientist, forensic at [employer].  She has 1 son.  She has some  psychosocial stressors recently,  as she is keeping her grandchildren, and she is worried about her son's  economic independence.   FAMILY HISTORY:  Mother died at age 70.  She suffered from diabetes,  hypertension, rheumatoid arthritis.  Her father died at age 68.  He did  suffer his first heart attack before age 59 and died with heart disease.   REVIEW OF SYSTEMS:  She states that she has had loss of appetite for the  past 3 days, and some weight loss that she estimates to be 3-4 pounds.  She has had some problems with constipation and hemorrhoids that have  been resolved with stool softeners.  She has had some nausea, but no  vomiting.  No vaginal discharge.  She reports having a colonoscopy  approximately 4 years ago __________.  She had her mammogram this past  fall.  She states that her mammograms have been normal otherwise.  She  has had no swelling of the lower extremities.  No blood in her stools.  Review of systems further is unremarkable.   PHYSICAL EXAMINATION:  VITAL SIGNS:  In the emergency department her  vital signs were stable, with a blood pressure of 110/58.  Heart rate  73.  Respirations  18.  O2 saturation 99%.  HEENT revealed her head to be normocephalic, atraumatic.  Extraocular  muscles are intact.  NECK:  Was supple, nontender.  No palpable thyromegaly or mass.  CARDIOVASCULAR:  Exam reveals normal S1, S2, without appreciable murmur.  LUNGS:  She exhibited normal effort.  There were some scattered wheezes  throughout.  There was no dullness to percussion.  ABDOMEN:  Was obese, soft, and nontender.  No rebound or guarding or  rigidity.  No suprapubic or costovertebral angle tenderness.  EXTREMITIES:  The lower extremities revealed no cyanosis, clubbing or  edema.  Her peripheral pulses were symmetrical and palpable bilaterally.  NEUROLOGICAL EXAM:  Revealed her to be alert and oriented x3.  Her  affect was stable.  She was euthymic.   LABORATORY DATA:  She had a sodium of 137, potassium 39, BUN 10,  creatinine 0.8, glucose of 134.  WBC of 84, hemoglobin 14.8, platelet  count 356.  MCV of 94.  D-dimer was 0.73, as noted above.  Pulmonary  care marker was negative.  CT angiogram of the chest revealed negative  for acute PE.  There was some right middle lobe atelectasis, otherwise.   ASSESSMENT:  1. Chest pain with risk factors.  2. Tobacco dependence.  3. Obesity.  4. Hyperlipidemia.  5. Degenerative disk disease.   PLAN:  At this time we are going to rule [name] [name] out for acute  ischemic event.  We will involve [facility], since they have  performed stress tests on her in the past, and if she rules out we will  discuss further risk stratifying studies under the direction of [place].      Hettie Holstein, D.O.  Electronically Signed     ESS/MEDQ  D:  12/21/2006  T:  12/21/2006  Job:  [id number]   cc:   [facility]

## 2010-12-24 NOTE — Assessment & Plan Note (Signed)
[facility]                            CARDIOLOGY OFFICE NOTE   NAME:[name], [name] DRAWDY                      MRN:          [id number]  DATE:07/16/2007                            DOB:          09/15/1950    PRIMARY:  Dr. Frazier Richards, PA at [facility].   REASON FOR PRESENTATION:  Evaluate patient with chest pain,  hypertension.   HISTORY OF PRESENT ILLNESS:  Patient returns for followup of the above.  She has coronary disease with cardiac catheterization earlier this year  and results as described below.  She has had no further chest  discomfort.  Unfortunately, she did not quit smoking.  She was given  Chantix prescription but did not take this.  She is very interested in  using this now.  She is committed to quitting smoking.  She has not been  having any palpitations which was a problem before.  She is having no  presyncope or syncope.  She is having no new shortness of breath, denies  any PND or orthopnea.   PAST MEDICAL HISTORY:  1. Nonobstructive coronary artery disease (left main normal, LAD 20%      proximal and mid stenosis, circumflex 20% proximal and distal      stenosis, the right coronary artery had 50% - 60% stenosis in the      mid portion; EF was 65%).  2. Hypertension.  3. Tobacco abuse.  4. Depression.  5. Insomnia.  6. Cholecystectomy.  7. Tubal ligation.   ALLERGIES:  LATEX AND GUAIFENESIN.   MEDICATIONS:  1. Effexor 75 mg daily.  2. Cymbalta 60 mg b.i.d.  3. Allegra 180 mg daily.  4. Aspirin 81 mg daily.  5. Cartia 120 mg daily.  6. Simvastatin 40 mg daily.  7. Requip 1 mg daily.  8. Ambien 12.5 mg nightly.  9. Benazepril 40 mg daily.  10.Protonix 40 mg daily.   REVIEW OF SYSTEMS:  As stated in the HPI and otherwise negative for  other systems.   PHYSICAL EXAMINATION:  Patient is in no distress.  Blood pressure  148/80, heart rate 85 and regular, weight 192 pounds, body mass index  32.  HEENT:  Eyelids unremarkable, pupils equally round and reactive to  light, fundi not visualized.  NECK:  No jugular venous distention at 45 degrees, carotid upstroke  brisk and symmetric, no bruits, no thyromegaly.  LYMPHATICS:  No adenopathy.  LUNGS:  Clear to auscultation bilaterally.  BACK:  No costovertebral angle tenderness.  CHEST:  Unremarkable.  HEART:  PMI not displaced or sustained, S1 and S2 within normal limits,  no S3, no S4, no clicks, no rubs, no murmurs.  ABDOMEN:  Obese, positive bowel sounds normal in frequency and pitch, no  bruits, no rebound, no guarding, no midline pulsatile mass, no  hepatomegaly, splenomegaly.  SKIN:  No rashes, no nodules.  EXTREMITIES:  With 2+ pulses, no edema.   EKG sinus rhythm, rate 84, axis within normal limits, intervals within  normal limits, poor anterior R wave progression, no acute ST-T wave  changes.   ASSESSMENT/PLAN:  1. Coronary disease, patient is having no new symptoms.  No further      cardiovascular testing is suggested.  She will continue with risk      reduction.  2. Tobacco, we discussed this again (greater than 3 minutes), she is      going to get a prescription for Chantix.  As outlined on the      previous note, she understands all of the black box warnings and      other risks.  3. Hypertension, her blood pressure remains elevated and I am going to      take the liberty of giving her hydrochlorothiazide 12.5 mg daily.      She is told to increase her potassium intake.  She is given written      instructions to get a BMET in 2 weeks.  4. Followup.  I will see her back in 1 month or sooner if needed.     Rollene Rotunda, MD, [facility]  Electronically Signed    JH/MedQ  DD: 07/16/2007  DT: 07/16/2007  Job #: [id number]   cc:   [address] [place], [place]

## 2010-12-24 NOTE — Op Note (Signed)
NAMESERRENA, LINDERMAN               ACCOUNT NO.:  [id number]   MEDICAL RECORD NO.:  [id number]          PATIENT TYPE:  AMB   LOCATION:  DAY                          FACILITY[facility]  [facility]   PHYSICIAN:  Juanetta Gosling, MDDATE OF BIRTH:  12-Apr-1951   DATE OF PROCEDURE:  02/01/2009  DATE OF DISCHARGE:                               OPERATIVE REPORT   PREOPERATIVE DIAGNOSIS:  External hemorrhoid.   POSTOPERATIVE DIAGNOSIS:  External hemorrhoid.   PROCEDURE:  External hemorrhoidectomy.   SURGEON:  Troy Sine. Dwain Sarna, MD.   ASSISTANT:  None.   ANESTHESIA:  Perianal block and MAC.   SPECIMENS:  External hemorrhoid to pathology.   ESTIMATED BLOOD LOSS:  Minimal.   COMPLICATIONS:  None.   DISPOSITION:  To the recovery room in stable condition.   INDICATIONS:  Ms. Wolven is a 60 year old female who had a history of an  external thrombosed hemorrhoid and was under significant pain.  This  resolved on its own and now she is having a large skin tag back there  with difficulty with hygiene.  She began an exercise program and is  riding a bike and this is bothering her while she is doing that.  On her  exam in the office she had a large anterior external skin tag and rectal  exam showed no masses on her anoscopic examination.  She had small  internal hemorrhoids in each position.  We discussed external  hemorrhoidectomy.   PROCEDURE:  After informed consent was obtained, the patient was taken  to the operating room.  She was placed in the prone jack-knife position  and she had sequential compression devices placed on her lower  extremities prior to operation.  She was then prepped and draped in the  standard surgical fashion.  She was placed under monitored anesthesia  care.  A surgical time-out was then performed.   I infiltrated a mixture of 0.25% Marcaine with epinephrine and 1%  lidocaine into all four quadrants underneath the skin around the anus,  approximately 5 mL.  I also  infiltrated 10 mL into the deep perianal  space on either side.  She tolerated this well.  The only abnormality  was a large external skin tag.  This was excised with a knife and  electrocautery.  Hemostasis was obtained.  This was passed off the table  as a specimen.  I then placed a 4-0 locking chromic suture through the  anoderm and then placed a piece of Gelfoam mixed with dibucaine  overlying this.  She tolerated this well and was transferred to the  recovery room in stable condition.      Juanetta Gosling, MD  Electronically Signed     MCW/MEDQ  D:  02/01/2009  T:  02/01/2009  Job:  [id number]   cc:   Ines Bloomer

## 2010-12-24 NOTE — Assessment & Plan Note (Signed)
[facility]                         GASTROENTEROLOGY OFFICE NOTE   NAME:Falco, NYONNA HARGROVE                      MRN:          [id number]  DATE:01/26/2007                            DOB:          1951/04/06    Mrs. Cales is a 60 year old white female recently hospitalized with  atypical chest pain. She had a negative cardiac catheterization and was  referred to GI for evaluation.   I previously saw Mrs. Tallent several years ago because of a guaiac  positive stool and she had a negative colonoscopy examination in  December of 2001. She is status post laparoscopic cholecystectomy many  years ago. She suffered in the past from hypertension, depression, and  insomnia.   She recently has had no appetite and has lost 16 pounds in weight. She  describes discomfort in her epigastric area with gas and bloating with  typical reflux symptoms with occasional dysphagia. She has had no  specific hepatobiliary complaints such as clay-colored stools, dark  urine, icterus, fever or chills. She denies lower GI problems but does  occasionally have some hemorrhoidal bleeding. She denies use of  cigarettes, alcohol or NSAIDs. She has never had previous endoscopic  examinations. Cannot say if she has had previous ultrasound examination.  She denies any specific food intolerance.   PAST MEDICAL HISTORY:  1. The patient has hypertension.  2. Adult onset diabetes.  3. Hypercholesterolemia.  4. Chronic anxiety and depression.  5. Has had previous tubal ligation and cholecystectomy.   She is followed closely by her cardiologist and has non-critical  coronary artery disease.   MEDICATIONS:  1. Effexor 75 mg a day.  2. Cymbalta 60 mg twice a day.  3. Allegra 180 mg a day.  4. Aspirin 81 mg a day.  5. Cartia 120 mg a day.  6. Benazepril 10 mg a day.  7. Protonix 40 mg a day for the last three weeks.  8. Simvastatin 40 mg a day.  9. Requip 1 mg a day.  10.Ambien 12.5  mg at bedtime.   SHE DOES HAVE A HISTORY OF A LATEX ALLERGY.   FAMILY HISTORY:  Remarkable for colon polyps in her father and her  sister. Multiple members with diabetes and atherosclerosis. She has a  sister with breast cancer.   SOCIAL HISTORY:  She is married and lives with her husband. She works in  a Physicist, medical. She does smoke. Denies ethanol abuse.   REVIEW OF SYSTEMS:  Noncontributory.   PHYSICAL EXAMINATION:  She is a healthy-appearing white female in no  distress. Appears her stated age. She is 5 feet, 4 inches tall, weighs  180 pounds. Blood pressure is 130/60. Pulse 96 and regular.  I could not appreciate stigmata of chronic liver disease and there was  no thyromegaly or lymphadenopathy noted. CHEST: Was clear.  She was in a regular rhythm without murmurs, gallops or rubs.  I could not appreciate hepatosplenomegaly, abdominal masses or  tenderness. Bowel sounds were normal.  Peripheral extremities were unremarkable.  Mental status was clear.  RECTAL: Examination was deferred.   ASSESSMENT:  1. Epigastric abdominal pain  with anorexia and weight loss despite      treatment with PPI therapy. It is certainly possible that the      patient has gastric ulceration or structural lesion in her upper GI      tract. As mentioned above, she is status post cholecystectomy.  2. Family history of colon polyps with negative colonoscopy      examination in the last several years.  3. Non-critical coronary artery disease.  4. History of diabetes and restless leg syndrome.  5. History of hypertension, hypercholesterolemia.  6. History of chronic anxiety and depression.  7. History of chronic cigarette abuse.   RECOMMENDATIONS:  I have scheduled her for outpatient endoscopy tomorrow  at her convenience and will proceed accordingly. Should this be  unremarkable, will proceed with abdominal ultrasound examination and  further workup.     Vania Rea. Jarold Motto, MD, [facility], FAGA   Electronically Signed    DRP/MedQ  DD: 01/26/2007  DT: 01/26/2007  Job #: [id number]   cc:   Shon [facility] [place], [place]  Ernestina Penna, M.D.

## 2010-12-24 NOTE — Assessment & Plan Note (Signed)
[facility]                            CARDIOLOGY OFFICE NOTE   NAME:[name] [name], [name] [name]                      MRN:          [id number]  DATE:09/19/2008                            DOB:          09/15/1950    PRIMARY CARE PHYSICIAN:  Frazier Richards PA, [facility].   REASON FOR PRESENTATION:  Evaluate the patient with coronary artery  disease.   HISTORY OF PRESENT ILLNESS:  The patient is a 60 year old.  She returns  for followup of the above.  Since, I last saw her she has had no new  complaints.  She does walk for exercise when the weather permits.  With  this, she does not get any chest pressure, neck or arm discomfort.  She  does not have any palpitations, presyncope, or syncope.  She has not had  any new shortness of breath.  Denies any PND or orthopnea.  She does not  walk during the winter time or exercise routinely then.  She is  unfortunately still smoking about a pack of cigarettes a day.   PAST MEDICAL HISTORY:  Nonobstructive coronary artery disease (left main  normal, LAD 20% proximal and mid stenosis, circumflex 20% proximal and  distal stenosis, right coronary artery 50-60% stenosis in the mid  portion.  The EF of 65%.), hypertension, tobacco abuse, depression,  insomnia, cholecystectomy, tubal ligation.   ALLERGIES:  LATEX and GUAIFENESIN.   MEDICATIONS:  1. Allegra 180 mg daily.  2. Lasix 81 mg daily.  3. Cartia 120 mg daily.  4. Simvastatin 40 mg daily.  5. ReQuip 1 mg daily.  6. Ambien 12.5 mg at bedtime.  7. Benazepril 40 mg daily.  8. Protonix 40 mg daily.  9. Kapidex.   REVIEW OF SYSTEMS:  As stated in the HPI, and otherwise negative for all  other systems.   PHYSICAL EXAMINATION:  GENERAL:  The patient is in no distress.  VITAL SIGNS:  Blood pressure 140/80, heart rate 92 and regular, weight  186 pounds, body mass index 32.  HEENT:  Eyelids are unremarkable.  Pupils equal, round, reactive to  light.   Fundi within normal limits.  Oral mucosa unremarkable.  NECK:  No jugular venous distention at 45 degrees.  Carotid upstroke  brisk and symmetric, right carotid bruit.  No thyromegaly.  LYMPHATICS:  No cervical, axillary, or inguinal adenopathy.  LUNGS:  Clear to auscultation bilaterally.  BACK:  No costovertebral angle tenderness.  CHEST:  Unremarkable.  HEART:  PMI not displaced or sustained.  S1 and S2 within normal limits.  No S3.  No S4.  No clicks.  No rubs.  No murmurs.  ABDOMEN:  Obese.  Positive bowel sounds.  Normal in frequency and pitch.  No bruits.  No rebound.  No guarding.  No midline pulsatile mass.  No  hepatomegaly.  No splenomegaly.  SKIN:  No rashes.  No nodules.  EXTREMITIES:  Pulses 2+ throughout.  No edema.  No cyanosis.  No  clubbing.  NEURO:  Oriented to person, place, and time.  Cranial nerves II through  XII grossly intact.  Motor grossly intact.   EKG sinus rhythm, rate 92, axis within normal limits.  Intervals within  normal limits.  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  No acute ST-T wave changes.  No change in  previous EKGs.   ASSESSMENT AND PLAN:  1. Coronary artery disease.  The patient has nonobstructive coronary      artery disease.  She has no new symptoms, however, she needs to      participate very aggressively in risk reduction.  We emphasized      this during this appointment.  No further cardiovascular testing is      suggested for evaluation of the coronary artery disease at this      point.  2. Carotid bruit.  She does have this.  I would like to get a carotid      Doppler to evaluate this.  3. Tobacco.  We discussed this (greater than 3 minutes).  I gave her a      prescription for Chantix.  She understands the potential risk      including suicidal ideation and depression.  She will take as      directed.  4. Dyslipidemia.  She had a recent lipid profile.  I would like to      review this.   I gave her the goals of an LDL less than 100 and HDL      greater than 50 as aggressive therapy for her known coronary artery      disease and ongoing risk factors.  5. Palpitations.  These are not particularly problematic.  She has      them rarely.  She will continue with [place].  6. Insomnia.  This is a significant complaint for the patient.  She      will discuss this again with her primary care doctor and let me      know if it gets worse from the Chantix.  7. Followup.  See you back in 1 year or sooner based on any further      problems.     Rollene Rotunda, MD, [facility]  Electronically Signed    JH/MedQ  DD: 09/19/2008  DT: 09/20/2008  Job #: [id number]   cc:   [address] [place], [place]

## 2010-12-24 NOTE — Cardiovascular Report (Signed)
[name], [name]               ACCOUNT NO.:  [id number]   MEDICAL RECORD NO.:  [id number]          PATIENT TYPE:  INP   LOCATION:  4733                         FACILITY:  [facility]   PHYSICIAN:  Jonelle Sidle, MD DATE OF BIRTH:  [birth year]   DATE OF PROCEDURE:  12/23/2006  DATE OF DISCHARGE:                            CARDIAC CATHETERIZATION   REQUESTING CARDIOLOGIST:  Dr. Rollene Rotunda.   PRIMARY CARE:  Nadara Mustard, FNP   INDICATIONS:  [name] [name] is a 60 year old woman history of  hyperlipidemia, hypertension, borderline diabetes mellitus, and tobacco  use.  She is admitted to the hospital with chest pain and has ruled out  for myocardial infarction.  She had a previous myocardial perfusion  study that showed no perfusion evidence of ischemia, although with  abnormal electrocardiographic changes.  Given her risk factor profile  she is now referred for a definitive diagnostic cardiac catheterization  to assess the coronary anatomy and evaluate for any revascularizations  options.  The potential risks and benefits were explained her in advance  and informed consent was obtained.   PROCEDURES PERFORMED:  1. Left heart catheterization  2. Selective coronary angiography  3. Left ventriculography.   ACCESS AND EQUIPMENT:  The area about the right femoral artery was  anesthetized with 1% lidocaine and a 6-French sheath was placed in the  right femoral artery via the modified Seldinger technique.  Standard  preformed 6-French, JL-4 and JR-4 catheters were used for selective  coronary angiography and an angled pigtail catheter was used for left  heart catheterization and left ventriculography.  Total of 120 mL  Omnipaque were used.  The patient tolerated the procedure well without  immediate complications.   HEMODYNAMIC RESULTS:  Aorta 128/60 mmHg.  Left ventricle 123/16mHg.   ANGIOGRAPHIC FINDINGS:  1. The left main coronary artery is relatively short and gives rise to     the left anterior descending and circumflex vessels.  No      obstructive stenosis is noted.  2. The left anterior descending is a medium caliber vessel with three      relatively small diagonal branches.  There is mild atherosclerosis      noted with approximately 20% stenosis noted in the proximal and      mid vessel.  No obstructive stenoses are noted.  3. The circumflex coronary artery is a relatively large vessel with      three obtuse marginal branches,  the second of which is the      largest.  There is mild atherosclerosis noted including 20%      proximal and distal circumflex stenoses.  No obstructive stenoses      are noted.  4. The right coronary artery is a medium caliber vessel that is      dominant, providing the posterior descending branch.  There is a      focal area of approximately 50-60% stenosis in the proximal portion      of the vessel which to some degree may be an anatomic bend.  150      mcg of intracoronary nitroglycerin leads to  some improvement      although the area persists at approximately 50% in most views.      Flow was TIMI III throughout.   Left ventriculography was performed in the RAO projection and reveals an  ejection fraction of approximately 65% with no focal wall motion  abnormality.  No significant mitral vegetation.   DIAGNOSIS:  1. Predominately mild coronary atherosclerosis as outlined above with      moderate disease in the proximal right coronary artery of      approximately 50%.  No critical stenoses are noted.  2. Left ventricular ejection fraction approximately 65% with a left      ventricular end diastolic pressure of 15 mmHg and no significant      mitral regurgitation.   DISCUSSION:  I reviewed the results with the patient, her family and  with Dr. Antoine Poche.  At this point would anticipate medical therapy.  Smoking cessation will be very important as well.  It is possible that  she is manifesting some angina due to coronary  spasm on a baseline of  mild to moderate coronary atherosclerosis and in the setting of type 2  diabetes mellitus.  No clear revascularizations options are noted at  this particular time.      Jonelle Sidle, MD  Electronically Signed     SGM/MEDQ  D:  12/23/2006  T:  12/23/2006  Job:  [id number]

## 2011-01-17 ENCOUNTER — Other Ambulatory Visit (HOSPITAL_COMMUNITY): Payer: Self-pay | Admitting: Obstetrics and Gynecology

## 2011-01-17 DIAGNOSIS — Z1231 Encounter for screening mammogram for malignant neoplasm of breast: Secondary | ICD-10-CM

## 2011-01-25 ENCOUNTER — Emergency Department (HOSPITAL_COMMUNITY)
Admission: EM | Admit: 2011-01-25 | Discharge: 2011-01-25 | Disposition: A | Discharge status: Home / Self Care | Payer: Medicare Other | Attending: Emergency Medicine | Admitting: Emergency Medicine

## 2011-01-25 DIAGNOSIS — F3289 Other specified depressive episodes: Secondary | ICD-10-CM | POA: Insufficient documentation

## 2011-01-25 DIAGNOSIS — F329 Major depressive disorder, single episode, unspecified: Secondary | ICD-10-CM | POA: Insufficient documentation

## 2011-01-25 DIAGNOSIS — K5641 Fecal impaction: Secondary | ICD-10-CM | POA: Insufficient documentation

## 2011-01-25 DIAGNOSIS — E785 Hyperlipidemia, unspecified: Secondary | ICD-10-CM | POA: Insufficient documentation

## 2011-01-25 DIAGNOSIS — Z79899 Other long term (current) drug therapy: Secondary | ICD-10-CM | POA: Insufficient documentation

## 2011-01-25 DIAGNOSIS — IMO0002 Reserved for concepts with insufficient information to code with codable children: Secondary | ICD-10-CM | POA: Insufficient documentation

## 2011-01-25 DIAGNOSIS — I10 Essential (primary) hypertension: Secondary | ICD-10-CM | POA: Insufficient documentation

## 2011-02-20 ENCOUNTER — Ambulatory Visit (HOSPITAL_COMMUNITY)
Admission: RE | Admit: 2011-02-20 | Discharge: 2011-02-20 | Disposition: A | Discharge status: Home / Self Care | Payer: Medicare Other | Source: Ambulatory Visit | Attending: Obstetrics and Gynecology | Admitting: Obstetrics and Gynecology

## 2011-02-20 DIAGNOSIS — Z1231 Encounter for screening mammogram for malignant neoplasm of breast: Secondary | ICD-10-CM | POA: Insufficient documentation

## 2011-05-27 LAB — HM DIABETES EYE EXAM

## 2011-07-26 ENCOUNTER — Other Ambulatory Visit (HOSPITAL_COMMUNITY): Payer: Self-pay | Admitting: Psychiatry

## 2011-10-24 DIAGNOSIS — R5383 Other fatigue: Secondary | ICD-10-CM | POA: Diagnosis not present

## 2011-10-24 DIAGNOSIS — Z0000 Encounter for general adult medical examination without abnormal findings: Secondary | ICD-10-CM | POA: Diagnosis not present

## 2011-10-24 DIAGNOSIS — E119 Type 2 diabetes mellitus without complications: Secondary | ICD-10-CM | POA: Diagnosis not present

## 2011-11-04 DIAGNOSIS — M542 Cervicalgia: Secondary | ICD-10-CM | POA: Diagnosis not present

## 2011-11-04 DIAGNOSIS — Z79899 Other long term (current) drug therapy: Secondary | ICD-10-CM | POA: Diagnosis not present

## 2011-11-14 DIAGNOSIS — K5289 Other specified noninfective gastroenteritis and colitis: Secondary | ICD-10-CM | POA: Diagnosis not present

## 2011-11-17 ENCOUNTER — Encounter: Payer: Self-pay | Admitting: Gastroenterology

## 2011-11-17 DIAGNOSIS — M545 Low back pain: Secondary | ICD-10-CM | POA: Diagnosis not present

## 2011-11-19 DIAGNOSIS — F4320 Adjustment disorder, unspecified: Secondary | ICD-10-CM | POA: Diagnosis not present

## 2011-11-21 DIAGNOSIS — M545 Low back pain: Secondary | ICD-10-CM | POA: Diagnosis not present

## 2011-11-25 DIAGNOSIS — M545 Low back pain: Secondary | ICD-10-CM | POA: Diagnosis not present

## 2011-11-27 DIAGNOSIS — M545 Low back pain: Secondary | ICD-10-CM | POA: Diagnosis not present

## 2011-12-01 DIAGNOSIS — M545 Low back pain: Secondary | ICD-10-CM | POA: Diagnosis not present

## 2011-12-03 DIAGNOSIS — M545 Low back pain: Secondary | ICD-10-CM | POA: Diagnosis not present

## 2011-12-08 ENCOUNTER — Encounter: Payer: Self-pay | Admitting: *Deleted

## 2011-12-08 DIAGNOSIS — M545 Low back pain: Secondary | ICD-10-CM | POA: Diagnosis not present

## 2011-12-09 ENCOUNTER — Other Ambulatory Visit (INDEPENDENT_AMBULATORY_CARE_PROVIDER_SITE_OTHER): Payer: Medicare Other

## 2011-12-09 ENCOUNTER — Encounter: Payer: Self-pay | Admitting: Gastroenterology

## 2011-12-09 ENCOUNTER — Ambulatory Visit (INDEPENDENT_AMBULATORY_CARE_PROVIDER_SITE_OTHER): Payer: Medicare Other | Admitting: Gastroenterology

## 2011-12-09 VITALS — BP 134/88 | HR 76 | Ht 64.0 in | Wt 206.2 lb

## 2011-12-09 DIAGNOSIS — D509 Iron deficiency anemia, unspecified: Secondary | ICD-10-CM | POA: Diagnosis not present

## 2011-12-09 DIAGNOSIS — K219 Gastro-esophageal reflux disease without esophagitis: Secondary | ICD-10-CM | POA: Diagnosis not present

## 2011-12-09 DIAGNOSIS — R197 Diarrhea, unspecified: Secondary | ICD-10-CM

## 2011-12-09 DIAGNOSIS — R6889 Other general symptoms and signs: Secondary | ICD-10-CM

## 2011-12-09 LAB — AMYLASE: Amylase: 52 U/L (ref 27–131)

## 2011-12-09 LAB — LIPASE: Lipase: 21 U/L (ref 11.0–59.0)

## 2011-12-09 LAB — IBC PANEL
Saturation Ratios: 21.3 % (ref 20.0–50.0)
Transferrin: 298.7 mg/dL (ref 212.0–360.0)

## 2011-12-09 MED ORDER — HYOSCYAMINE-PHENYLTOLOXAMINE 0.0625-15 MG PO CAPS: 1.0000 | ORAL_CAPSULE | Freq: Two times a day (BID) | ORAL | Status: DC

## 2011-12-09 MED ORDER — SUCRALFATE 1 GM/10ML PO SUSP: ORAL | Status: DC

## 2011-12-09 MED ORDER — MOVIPREP 100 G PO SOLR: 1.0000 | Freq: Once | ORAL | Status: DC

## 2011-12-09 NOTE — Patient Instructions (Signed)
Your procedure has been scheduled for 12/15/2011, please follow the seperate instructions.  Take Carafate 2 tsp after meals and at bedtime. Take Digex twice a day. Please go to the basement today for your labs.  Your prescription(s) have been sent to you pharmacy, Digex, Carafate.

## 2011-12-09 NOTE — Progress Notes (Signed)
History of Present Illness:  This is a 61 year old  female who presents with 6 weeks of initial nausea vomiting diarrhea followed by abdominal gas, bloating, midabdominal discomfort, alternating diarrhea and constipation, bloating and burping. She is status post cholecystectomy. She has a long history of acid reflux and has been on PPI therapy for many years. She denies abuse of NSAIDs, alcohol or cigarettes, but is on aspirin 81 mg a day per previous angioplasty. The patient specifically denies melena, hematochezia, or any specific hepatobiliary or systemic complaints otherwise. Her last endoscopy and colonoscopy were in 2008. She in the past has been treated for H. pylori infection. There is no history of chart review of hepatitis or pancreatitis. Family history is noncontributory. Patient denies lactose intolerance or excessive use of sorbitol or fructose.  I have reviewed this patient's present history, medical and surgical past history, allergies and medications.     ROS: The remainder of the 10 point ROS is negative     Physical Exam: Blood pressure 134/88, pulse 76 and regular, weight 206 pounds, BMI 35.39. General well developed well nourished patient in no acute distress, appearing  stated age Eyes PERRLA, no icterus, fundoscopic exam per opthamologist Skin no lesions noted Neck supple, no adenopathy, no thyroid enlargement, no tenderness Chest clear to percussion and auscultation Heart no significant murmurs, gallops or rubs noted Abdomen no hepatosplenomegaly masses or tenderness, BS normal.  Rectal inspection normal no fissures, or fistulae noted.  No masses or tenderness on digital exam. Stool guaiac negative. Extremities no acute joint lesions, edema, phlebitis or evidence of cellulitis. Neurologic patient oriented x 3, cranial nerves intact, no focal neurologic deficits noted. Psychological mental status normal and normal affect.  Assessment and plan: Consider recurrent H.  pylori infection exacerbating her chronic acid reflux, also suspected element of gastroparesis. We will repeat her and asked if they with biopsies for H. pylori and celiac disease. Last colonoscopy was in 2001, and we'll also repeat his for colon cancer screening purposes. I have added p.c. and each bedtime Carafate suspension to her therapy, Digex and spasmodic twice a day,FODMAP diet, and will continue daily Dexilant. We will request labs from primary care for review.  Encounter Diagnoses  Name Primary?  . Esophageal reflux Yes  . Diarrhea   . Iron deficiency anemia, unspecified    . Other general symptoms

## 2011-12-11 ENCOUNTER — Other Ambulatory Visit: Payer: Self-pay | Admitting: Gastroenterology

## 2011-12-11 ENCOUNTER — Telehealth: Payer: Self-pay

## 2011-12-11 DIAGNOSIS — M545 Low back pain: Secondary | ICD-10-CM | POA: Diagnosis not present

## 2011-12-11 DIAGNOSIS — E538 Deficiency of other specified B group vitamins: Secondary | ICD-10-CM

## 2011-12-11 LAB — CELIAC PANEL 10
Gliadin IgA: 5.7 U/mL (ref <20)
IgA: 277 mg/dL (ref 69–380)
Tissue Transglut Ab: 5 U/mL (ref <20)

## 2011-12-11 MED ORDER — CYANOCOBALAMIN 1000 MCG/ML IJ SOLN: 1000.0000 ug | INTRAMUSCULAR | Status: AC

## 2011-12-11 MED ORDER — CYANOCOBALAMIN 1000 MCG/ML IJ SOLN
1000.0000 ug | INTRAMUSCULAR | Status: AC
  Administered 2011-12-16 – 2012-01-01 (×3): 1000 ug via INTRAMUSCULAR

## 2011-12-11 NOTE — Telephone Encounter (Signed)
Message copied by Jessee Avers on Thu Dec 11, 2011  2:58 PM ------      Message from: PATTERSON, DAVID R      Created: Tue Dec 09, 2011  3:01 PM       Very low B12 level and needs to start shots ASAP.

## 2011-12-11 NOTE — Telephone Encounter (Signed)
Informed patient of lab results and discussed B12 injections. Pt agrees to get 3 weekly injections and then monthly x 1 year. First B12 injection scheduled for 12/16/11 at 11:00am.

## 2011-12-15 ENCOUNTER — Encounter: Payer: Self-pay | Admitting: Gastroenterology

## 2011-12-15 ENCOUNTER — Ambulatory Visit (AMBULATORY_SURGERY_CENTER): Payer: Medicare Other | Admitting: Gastroenterology

## 2011-12-15 VITALS — BP 158/88 | HR 86 | Temp 97.1°F | Resp 16 | Ht 64.0 in | Wt 206.0 lb

## 2011-12-15 DIAGNOSIS — R079 Chest pain, unspecified: Secondary | ICD-10-CM

## 2011-12-15 DIAGNOSIS — R197 Diarrhea, unspecified: Secondary | ICD-10-CM

## 2011-12-15 DIAGNOSIS — K5730 Diverticulosis of large intestine without perforation or abscess without bleeding: Secondary | ICD-10-CM

## 2011-12-15 DIAGNOSIS — I10 Essential (primary) hypertension: Secondary | ICD-10-CM | POA: Diagnosis not present

## 2011-12-15 DIAGNOSIS — F329 Major depressive disorder, single episode, unspecified: Secondary | ICD-10-CM | POA: Diagnosis not present

## 2011-12-15 DIAGNOSIS — E119 Type 2 diabetes mellitus without complications: Secondary | ICD-10-CM | POA: Diagnosis not present

## 2011-12-15 DIAGNOSIS — I2510 Atherosclerotic heart disease of native coronary artery without angina pectoris: Secondary | ICD-10-CM | POA: Diagnosis not present

## 2011-12-15 DIAGNOSIS — D1330 Benign neoplasm of unspecified part of small intestine: Secondary | ICD-10-CM

## 2011-12-15 DIAGNOSIS — K219 Gastro-esophageal reflux disease without esophagitis: Secondary | ICD-10-CM

## 2011-12-15 LAB — GLUCOSE, CAPILLARY: Glucose-Capillary: 119 mg/dL — ABNORMAL HIGH (ref 70–99)

## 2011-12-15 MED ORDER — SODIUM CHLORIDE 0.9 % IV SOLN: 500.0000 mL | INTRAVENOUS | Status: DC

## 2011-12-15 NOTE — Op Note (Signed)
[facility] [address] [place], [place]  [address]  ENDOSCOPY PROCEDURE REPORT  PATIENT:  [name] [name], [name] [name]  MR#:  [id number] BIRTHDATE:  12-10-1950, 60 yrs. old  GENDER:  female  ENDOSCOPIST:  Vania Rea. Jarold Motto, MD, [facility] Referred by:  PROCEDURE DATE:  12/15/2011 PROCEDURE:  EGD with biopsy for H. pylori 62130, EGD with biopsy, 43239 ASA CLASS:  Class II INDICATIONS:  GERD, nausea and vomiting GAS AND BLOATING  MEDICATIONS:   There was residual sedation effect present from prior procedure., propofol (Diprivan) 150 mg IV TOPICAL ANESTHETIC:  DESCRIPTION OF PROCEDURE:   After the risks and benefits of the procedure were explained, informed consent was obtained.  The [facility] [id number]-H180 [id number] endoscope was introduced through the mouth and advanced to the second portion of the duodenum.  The instrument was slowly withdrawn as the mucosa was fully examined. <<PROCEDUREIMAGES>>  The upper, middle, and distal third of the esophagus were carefully inspected and no abnormalities were noted. The z-line was well seen at the GEJ. The endoscope was pushed into the fundus which was normal including a retroflexed view. The antrum,gastric body, first and second part of the duodenum were unremarkable. SI BIOPSIES DONE AND CLO BIOPSY DONE.    Retroflexed views revealed no abnormalities.    The scope was then withdrawn from the patient and the procedure completed.  COMPLICATIONS:  None  ENDOSCOPIC IMPRESSION: 1) Normal EGD R/O CELIAC DISEASE,H.PYLORI VS GI MOTILITY DISORDER. RECOMMENDATIONS: 1) Await pathology results 2) Rx CLO if positive 3) continue PPI MAY NEED GASTRIC EMPTYING SCAN.  ______________________________ Vania Rea. Jarold Motto, MD, [facility]  CC:  Lynnea Ferrier, MD  n. Rosalie Doctor:   Vania Rea. Thomas Rhude at 12/15/2011 03:48 PM  Luiz Iron, [phone]

## 2011-12-15 NOTE — Progress Notes (Signed)
Patient did not experience any of the following events: a burn prior to discharge; a fall within the facility; wrong site/side/patient/procedure/implant event; or a hospital transfer or hospital admission upon discharge from the facility. (G8907) Patient did not have preoperative order for IV antibiotic SSI prophylaxis. (G8918)  

## 2011-12-15 NOTE — Op Note (Signed)
[facility] [address] [place], [place]  [address]  COLONOSCOPY PROCEDURE REPORT  PATIENT:  [name] [name], [name] [name]  MR#:  [id number] BIRTHDATE:  12/26/1950, 60 yrs. old  GENDER:  female ENDOSCOPIST:  [name] Rea. Jarold Motto, MD, [facility] REF. BY: PROCEDURE DATE:  12/15/2011 PROCEDURE:  Diagnostic Colonoscopy ASA CLASS:  Class II INDICATIONS:  Routine Risk Screening MEDICATIONS:   propofol (Diprivan) 300 mg IV  DESCRIPTION OF PROCEDURE:   After the risks and benefits and of the procedure were explained, informed consent was obtained. Digital rectal exam was performed and revealed no abnormalities. The [facility] [id number] endoscope was introduced through the anus and advanced to the cecum, which was identified by both the appendix and ileocecal valve.  The quality of the prep was excellent, using MoviPrep.  The instrument was then slowly withdrawn as the colon was fully examined. <<PROCEDUREIMAGES>>  FINDINGS:  Moderate diverticulosis was found in the sigmoid to descending colon segments.  No polyps or cancers were seen.  This was otherwise a normal examination of the colon.   Retroflexed views in the rectum revealed no abnormalities.    The scope was then withdrawn from the patient and the procedure completed.  COMPLICATIONS:  None ENDOSCOPIC IMPRESSION: 1) Moderate diverticulosis in the sigmoid to descending colon segments 2) No polyps or cancers 3) Otherwise normal examination RECOMMENDATIONS: 1) High fiber diet. 2) Continue current colorectal screening recommendations for "routine risk" patients with a repeat colonoscopy in 10 years.  REPEAT EXAM:  No  ______________________________ [name] Rea. Jarold Motto, MD, [facility]  CC:  Lynnea Ferrier, MD  n. Rosalie Doctor:   [name] Rea. [name] [name] at 12/15/2011 03:41 PM  Luiz Iron, [phone]

## 2011-12-15 NOTE — Patient Instructions (Signed)
YOU HAD AN ENDOSCOPIC PROCEDURE TODAY AT THE [facility]: Refer to the procedure report that was given to you for any specific questions about what was found during the examination.  If the procedure report does not answer your questions, please call your gastroenterologist to clarify.  If you requested that your care partner not be given the details of your procedure findings, then the procedure report has been included in a sealed envelope for you to review at your convenience later.  YOU SHOULD EXPECT: Some feelings of bloating in the abdomen. Passage of more gas than usual.  Walking can help get rid of the air that was put into your GI tract during the procedure and reduce the bloating. If you had a lower endoscopy (such as a colonoscopy or flexible sigmoidoscopy) you may notice spotting of blood in your stool or on the toilet paper. If you underwent a bowel prep for your procedure, then you may not have a normal bowel movement for a few days.  DIET: Your first meal following the procedure should be a light meal and then it is ok to progress to your normal diet.  A half-sandwich or bowl of soup is an example of a good first meal.  Heavy or fried foods are harder to digest and may make you feel nauseous or bloated.  Likewise meals heavy in dairy and vegetables can cause extra gas to form and this can also increase the bloating.  Drink plenty of fluids but you should avoid alcoholic beverages for 24 hours.  ACTIVITY: Your care partner should take you home directly after the procedure.  You should plan to take it easy, moving slowly for the rest of the day.  You can resume normal activity the day after the procedure however you should NOT DRIVE or use heavy machinery for 24 hours (because of the sedation medicines used during the test).    SYMPTOMS TO REPORT IMMEDIATELY: A gastroenterologist can be reached at any hour.  During normal business hours, 8:30 AM to 5:00 PM Monday through Friday,  call [phone].  After hours and on weekends, please call the GI answering service at [phone] who will take a message and have the physician on call contact you.   Following lower endoscopy (colonoscopy or flexible sigmoidoscopy):  Excessive amounts of blood in the stool  Significant tenderness or worsening of abdominal pains  Swelling of the abdomen that is new, acute  Fever of 100F or higher  Following upper endoscopy (EGD)  Vomiting of blood or coffee ground material  New chest pain or pain under the shoulder blades  Painful or persistently difficult swallowing  New shortness of breath  Fever of 100F or higher  Black, tarry-looking stools  FOLLOW UP: If any biopsies were taken you will be contacted by phone or by letter within the next 1-3 weeks.  Call your gastroenterologist if you have not heard about the biopsies in 3 weeks.  Our staff will call the home number listed on your records the next business day following your procedure to check on you and address any questions or concerns that you may have at that time regarding the information given to you following your procedure. This is a courtesy call and so if there is no answer at the home number and we have not heard from you through the emergency physician on call, we will assume that you have returned to your regular daily activities without incident.  SIGNATURES/CONFIDENTIALITY: You and/or your care   partner have signed paperwork which will be entered into your electronic medical record.  These signatures attest to the fact that that the information above on your After Visit Summary has been reviewed and is understood.  Full responsibility of the confidentiality of this discharge information lies with you and/or your care-partner.  

## 2011-12-16 ENCOUNTER — Ambulatory Visit (INDEPENDENT_AMBULATORY_CARE_PROVIDER_SITE_OTHER): Payer: Medicare Other | Admitting: Gastroenterology

## 2011-12-16 DIAGNOSIS — E538 Deficiency of other specified B group vitamins: Secondary | ICD-10-CM

## 2011-12-16 LAB — HELICOBACTER PYLORI SCREEN-BIOPSY: UREASE: POSITIVE

## 2011-12-17 ENCOUNTER — Telehealth: Payer: Self-pay | Admitting: *Deleted

## 2011-12-17 NOTE — Telephone Encounter (Signed)
  Follow up Call-  Call back number 12/15/2011  Post procedure Call Back phone  # [phone]  Permission to leave phone message Yes     Patient questions:  Do you have a fever, pain , or abdominal swelling? no Pain Score  0 *  Have you tolerated food without any problems? yes  Have you been able to return to your normal activities? yes  Do you have any questions about your discharge instructions: Diet   no Medications  no Follow up visit  no  Do you have questions or concerns about your Care? no  Actions: * If pain score is 4 or above: No action needed, pain <4.

## 2011-12-19 ENCOUNTER — Encounter: Payer: Self-pay | Admitting: Gastroenterology

## 2011-12-23 ENCOUNTER — Ambulatory Visit (INDEPENDENT_AMBULATORY_CARE_PROVIDER_SITE_OTHER): Payer: Medicare Other | Admitting: Gastroenterology

## 2011-12-23 DIAGNOSIS — E538 Deficiency of other specified B group vitamins: Secondary | ICD-10-CM

## 2011-12-29 ENCOUNTER — Encounter: Payer: Self-pay | Admitting: *Deleted

## 2011-12-30 DIAGNOSIS — G540 Brachial plexus disorders: Secondary | ICD-10-CM | POA: Diagnosis not present

## 2011-12-30 DIAGNOSIS — M5030 Other cervical disc degeneration, unspecified cervical region: Secondary | ICD-10-CM | POA: Diagnosis not present

## 2011-12-30 DIAGNOSIS — M5137 Other intervertebral disc degeneration, lumbosacral region: Secondary | ICD-10-CM | POA: Diagnosis not present

## 2011-12-30 DIAGNOSIS — G541 Lumbosacral plexus disorders: Secondary | ICD-10-CM | POA: Diagnosis not present

## 2012-01-01 ENCOUNTER — Encounter: Payer: Self-pay | Admitting: Gastroenterology

## 2012-01-01 ENCOUNTER — Ambulatory Visit (INDEPENDENT_AMBULATORY_CARE_PROVIDER_SITE_OTHER): Payer: Medicare Other | Admitting: Gastroenterology

## 2012-01-01 DIAGNOSIS — E538 Deficiency of other specified B group vitamins: Secondary | ICD-10-CM

## 2012-01-01 DIAGNOSIS — F1190 Opioid use, unspecified, uncomplicated: Secondary | ICD-10-CM

## 2012-01-01 DIAGNOSIS — R109 Unspecified abdominal pain: Secondary | ICD-10-CM | POA: Diagnosis not present

## 2012-01-01 DIAGNOSIS — Z8719 Personal history of other diseases of the digestive system: Secondary | ICD-10-CM

## 2012-01-01 DIAGNOSIS — F1910 Other psychoactive substance abuse, uncomplicated: Secondary | ICD-10-CM

## 2012-01-01 MED ORDER — ALIGN PO CAPS: 1.0000 | ORAL_CAPSULE | Freq: Every day | ORAL | Status: DC

## 2012-01-01 NOTE — Progress Notes (Signed)
History of Present Illness: This is a 61 year old Caucasian female with adult onset diabetes. She has chronic regurgitation, nausea, and dyspepsia despite treatment with PPI therapy and Carafate. Recent endoscopy was unremarkable but she did have a positive CLOtest. Much of her symptoms are consistent with gastroparesis, and she denies dysphagia other neuromuscular problems or bladder emptying difficulty. Her diabetes is seems to be well controlled on oral medications. She is on various types of narcotics for chronic pain syndrome. She was recently diagnosed with B12 deficiency and is receiving parenteral replacement therapy. Apparently in the past she also has been treated for H. pylori infection. Small bowel biopsy was unremarkable.    Current Medications, Allergies, Past Medical History, Past Surgical History, Family History and Social History were reviewed in [employer] record.   Assessment and plan: Symptoms seem most consistent with gastroparesis perhaps related to narcotic use. I've scheduled technetium gastric emptying scanning . She may need prokinetic therapy. Endoscopy showed no evidence of active gastritis or peptic ulcer disease. However, she may benefit from retreatment with 10 days of Prevpac therapy. Encounter Diagnosis  Name Primary?  . Abdominal pain Yes

## 2012-01-01 NOTE — Patient Instructions (Signed)
Start Align once a day samples given. Your Gastric empty scan is scheduled for 01/14/2012 please arrive at 7:45am for a 8:00am at [facility], please make sure you have nothing to eat or drink after midnight. HOLD YOUR DEXILANT STARTING ON 01/13/2012. Today you were given your last weekly b12 injection you will need a b12 injection in one month please stop by the front desk to schedule your next injection.

## 2012-01-12 ENCOUNTER — Other Ambulatory Visit (HOSPITAL_COMMUNITY): Payer: Self-pay | Admitting: Obstetrics and Gynecology

## 2012-01-12 DIAGNOSIS — Z1231 Encounter for screening mammogram for malignant neoplasm of breast: Secondary | ICD-10-CM

## 2012-01-12 LAB — HM MAMMOGRAPHY

## 2012-01-14 ENCOUNTER — Encounter (HOSPITAL_COMMUNITY): Payer: Self-pay

## 2012-01-14 ENCOUNTER — Encounter (HOSPITAL_COMMUNITY)
Admission: RE | Admit: 2012-01-14 | Discharge: 2012-01-14 | Disposition: A | Discharge status: Home / Self Care | Payer: Medicare Other | Source: Ambulatory Visit | Attending: Gastroenterology | Admitting: Gastroenterology

## 2012-01-14 DIAGNOSIS — R109 Unspecified abdominal pain: Secondary | ICD-10-CM | POA: Diagnosis not present

## 2012-01-14 DIAGNOSIS — E119 Type 2 diabetes mellitus without complications: Secondary | ICD-10-CM | POA: Diagnosis not present

## 2012-01-14 DIAGNOSIS — R110 Nausea: Secondary | ICD-10-CM | POA: Diagnosis not present

## 2012-01-14 MED ORDER — TECHNETIUM TC 99M SULFUR COLLOID
2.1000 | Freq: Once | INTRAVENOUS | Status: AC | PRN
  Administered 2012-01-14: 2.1 via ORAL

## 2012-01-15 ENCOUNTER — Other Ambulatory Visit: Payer: Self-pay | Admitting: Gastroenterology

## 2012-01-15 MED ORDER — AMOXICILL-CLARITHRO-LANSOPRAZ PO MISC: Freq: Two times a day (BID) | ORAL | Status: DC

## 2012-01-16 ENCOUNTER — Telehealth: Payer: Self-pay | Admitting: Gastroenterology

## 2012-01-16 MED ORDER — BIS SUBCIT-METRONID-TETRACYC 140-125-125 MG PO CAPS: 3.0000 | ORAL_CAPSULE | Freq: Three times a day (TID) | ORAL | Status: DC

## 2012-01-16 NOTE — Telephone Encounter (Signed)
Informed pt I left her vouchers for samples of Pylera; pt stated understanding.

## 2012-01-27 DIAGNOSIS — M542 Cervicalgia: Secondary | ICD-10-CM | POA: Diagnosis not present

## 2012-01-27 DIAGNOSIS — Z79899 Other long term (current) drug therapy: Secondary | ICD-10-CM | POA: Diagnosis not present

## 2012-01-27 DIAGNOSIS — M5030 Other cervical disc degeneration, unspecified cervical region: Secondary | ICD-10-CM | POA: Diagnosis not present

## 2012-01-27 DIAGNOSIS — M5137 Other intervertebral disc degeneration, lumbosacral region: Secondary | ICD-10-CM | POA: Diagnosis not present

## 2012-01-27 DIAGNOSIS — G541 Lumbosacral plexus disorders: Secondary | ICD-10-CM | POA: Diagnosis not present

## 2012-01-27 DIAGNOSIS — G540 Brachial plexus disorders: Secondary | ICD-10-CM | POA: Diagnosis not present

## 2012-01-29 ENCOUNTER — Telehealth: Payer: Self-pay | Admitting: Gastroenterology

## 2012-01-29 MED ORDER — CYANOCOBALAMIN 1000 MCG/ML IJ SOLN: 1000.0000 ug | INTRAMUSCULAR | Status: AC

## 2012-01-29 NOTE — Telephone Encounter (Signed)
Pt requests to have B12 injections done at Dr Caren Macadam office because it's closer. Faxed an order to Dr Caren Macadam office at [phone].

## 2012-02-04 DIAGNOSIS — E538 Deficiency of other specified B group vitamins: Secondary | ICD-10-CM | POA: Diagnosis not present

## 2012-02-23 ENCOUNTER — Ambulatory Visit (HOSPITAL_COMMUNITY)
Admission: RE | Admit: 2012-02-23 | Discharge: 2012-02-23 | Disposition: A | Discharge status: Home / Self Care | Payer: Medicare Other | Source: Ambulatory Visit | Attending: Obstetrics and Gynecology | Admitting: Obstetrics and Gynecology

## 2012-02-23 DIAGNOSIS — Z1231 Encounter for screening mammogram for malignant neoplasm of breast: Secondary | ICD-10-CM | POA: Insufficient documentation

## 2012-03-01 DIAGNOSIS — G541 Lumbosacral plexus disorders: Secondary | ICD-10-CM | POA: Diagnosis not present

## 2012-03-01 DIAGNOSIS — M5137 Other intervertebral disc degeneration, lumbosacral region: Secondary | ICD-10-CM | POA: Diagnosis not present

## 2012-03-01 DIAGNOSIS — M5030 Other cervical disc degeneration, unspecified cervical region: Secondary | ICD-10-CM | POA: Diagnosis not present

## 2012-03-01 DIAGNOSIS — G540 Brachial plexus disorders: Secondary | ICD-10-CM | POA: Diagnosis not present

## 2012-03-10 DIAGNOSIS — E538 Deficiency of other specified B group vitamins: Secondary | ICD-10-CM | POA: Diagnosis not present

## 2012-04-09 DIAGNOSIS — Z9181 History of falling: Secondary | ICD-10-CM | POA: Diagnosis not present

## 2012-04-09 DIAGNOSIS — Z79899 Other long term (current) drug therapy: Secondary | ICD-10-CM | POA: Diagnosis not present

## 2012-04-09 DIAGNOSIS — M542 Cervicalgia: Secondary | ICD-10-CM | POA: Diagnosis not present

## 2012-04-09 DIAGNOSIS — M5030 Other cervical disc degeneration, unspecified cervical region: Secondary | ICD-10-CM | POA: Diagnosis not present

## 2012-04-09 DIAGNOSIS — G540 Brachial plexus disorders: Secondary | ICD-10-CM | POA: Diagnosis not present

## 2012-04-09 DIAGNOSIS — M5137 Other intervertebral disc degeneration, lumbosacral region: Secondary | ICD-10-CM | POA: Diagnosis not present

## 2012-04-09 DIAGNOSIS — G541 Lumbosacral plexus disorders: Secondary | ICD-10-CM | POA: Diagnosis not present

## 2012-04-09 DIAGNOSIS — H81399 Other peripheral vertigo, unspecified ear: Secondary | ICD-10-CM | POA: Diagnosis not present

## 2012-04-11 HISTORY — PX: ROTATOR CUFF REPAIR: SHX139

## 2012-04-15 DIAGNOSIS — S46819A Strain of other muscles, fascia and tendons at shoulder and upper arm level, unspecified arm, initial encounter: Secondary | ICD-10-CM | POA: Diagnosis not present

## 2012-04-15 DIAGNOSIS — E538 Deficiency of other specified B group vitamins: Secondary | ICD-10-CM | POA: Diagnosis not present

## 2012-04-15 DIAGNOSIS — J069 Acute upper respiratory infection, unspecified: Secondary | ICD-10-CM | POA: Diagnosis not present

## 2012-04-16 ENCOUNTER — Other Ambulatory Visit: Payer: Self-pay | Admitting: Family Medicine

## 2012-04-16 DIAGNOSIS — M25511 Pain in right shoulder: Secondary | ICD-10-CM

## 2012-04-21 ENCOUNTER — Ambulatory Visit
Admission: RE | Admit: 2012-04-21 | Discharge: 2012-04-21 | Disposition: A | Discharge status: Home / Self Care | Payer: Medicare Other | Source: Ambulatory Visit | Attending: Family Medicine | Admitting: Family Medicine

## 2012-04-21 DIAGNOSIS — M25519 Pain in unspecified shoulder: Secondary | ICD-10-CM | POA: Diagnosis not present

## 2012-04-21 DIAGNOSIS — M25511 Pain in right shoulder: Secondary | ICD-10-CM

## 2012-04-21 DIAGNOSIS — Z043 Encounter for examination and observation following other accident: Secondary | ICD-10-CM | POA: Diagnosis not present

## 2012-04-29 DIAGNOSIS — G541 Lumbosacral plexus disorders: Secondary | ICD-10-CM | POA: Diagnosis not present

## 2012-04-29 DIAGNOSIS — H81399 Other peripheral vertigo, unspecified ear: Secondary | ICD-10-CM | POA: Diagnosis not present

## 2012-04-29 DIAGNOSIS — M5030 Other cervical disc degeneration, unspecified cervical region: Secondary | ICD-10-CM | POA: Diagnosis not present

## 2012-04-29 DIAGNOSIS — G540 Brachial plexus disorders: Secondary | ICD-10-CM | POA: Diagnosis not present

## 2012-05-03 DIAGNOSIS — M67919 Unspecified disorder of synovium and tendon, unspecified shoulder: Secondary | ICD-10-CM | POA: Diagnosis not present

## 2012-05-03 DIAGNOSIS — M779 Enthesopathy, unspecified: Secondary | ICD-10-CM | POA: Diagnosis not present

## 2012-05-07 DIAGNOSIS — M779 Enthesopathy, unspecified: Secondary | ICD-10-CM | POA: Diagnosis not present

## 2012-05-07 DIAGNOSIS — M719 Bursopathy, unspecified: Secondary | ICD-10-CM | POA: Diagnosis not present

## 2012-05-11 DIAGNOSIS — M779 Enthesopathy, unspecified: Secondary | ICD-10-CM | POA: Diagnosis not present

## 2012-05-11 DIAGNOSIS — M719 Bursopathy, unspecified: Secondary | ICD-10-CM | POA: Diagnosis not present

## 2012-05-14 DIAGNOSIS — M719 Bursopathy, unspecified: Secondary | ICD-10-CM | POA: Diagnosis not present

## 2012-05-14 DIAGNOSIS — M779 Enthesopathy, unspecified: Secondary | ICD-10-CM | POA: Diagnosis not present

## 2012-05-17 DIAGNOSIS — M779 Enthesopathy, unspecified: Secondary | ICD-10-CM | POA: Diagnosis not present

## 2012-05-17 DIAGNOSIS — M67919 Unspecified disorder of synovium and tendon, unspecified shoulder: Secondary | ICD-10-CM | POA: Diagnosis not present

## 2012-05-18 DIAGNOSIS — G540 Brachial plexus disorders: Secondary | ICD-10-CM | POA: Diagnosis not present

## 2012-05-18 DIAGNOSIS — H81399 Other peripheral vertigo, unspecified ear: Secondary | ICD-10-CM | POA: Diagnosis not present

## 2012-05-18 DIAGNOSIS — M5030 Other cervical disc degeneration, unspecified cervical region: Secondary | ICD-10-CM | POA: Diagnosis not present

## 2012-05-18 DIAGNOSIS — G541 Lumbosacral plexus disorders: Secondary | ICD-10-CM | POA: Diagnosis not present

## 2012-05-21 DIAGNOSIS — M719 Bursopathy, unspecified: Secondary | ICD-10-CM | POA: Diagnosis not present

## 2012-05-21 DIAGNOSIS — M779 Enthesopathy, unspecified: Secondary | ICD-10-CM | POA: Diagnosis not present

## 2012-05-21 DIAGNOSIS — M67919 Unspecified disorder of synovium and tendon, unspecified shoulder: Secondary | ICD-10-CM | POA: Diagnosis not present

## 2012-05-24 DIAGNOSIS — M779 Enthesopathy, unspecified: Secondary | ICD-10-CM | POA: Diagnosis not present

## 2012-05-24 DIAGNOSIS — M719 Bursopathy, unspecified: Secondary | ICD-10-CM | POA: Diagnosis not present

## 2012-05-24 DIAGNOSIS — M67919 Unspecified disorder of synovium and tendon, unspecified shoulder: Secondary | ICD-10-CM | POA: Diagnosis not present

## 2012-05-25 ENCOUNTER — Other Ambulatory Visit: Payer: Self-pay | Admitting: Dermatology

## 2012-05-25 DIAGNOSIS — D485 Neoplasm of uncertain behavior of skin: Secondary | ICD-10-CM | POA: Diagnosis not present

## 2012-05-25 DIAGNOSIS — L570 Actinic keratosis: Secondary | ICD-10-CM | POA: Diagnosis not present

## 2012-06-07 DIAGNOSIS — M67919 Unspecified disorder of synovium and tendon, unspecified shoulder: Secondary | ICD-10-CM | POA: Diagnosis not present

## 2012-06-07 DIAGNOSIS — M779 Enthesopathy, unspecified: Secondary | ICD-10-CM | POA: Diagnosis not present

## 2012-06-07 DIAGNOSIS — M719 Bursopathy, unspecified: Secondary | ICD-10-CM | POA: Diagnosis not present

## 2012-06-14 DIAGNOSIS — Z79899 Other long term (current) drug therapy: Secondary | ICD-10-CM | POA: Diagnosis not present

## 2012-06-14 DIAGNOSIS — M5030 Other cervical disc degeneration, unspecified cervical region: Secondary | ICD-10-CM | POA: Diagnosis not present

## 2012-06-14 DIAGNOSIS — G541 Lumbosacral plexus disorders: Secondary | ICD-10-CM | POA: Diagnosis not present

## 2012-06-14 DIAGNOSIS — G540 Brachial plexus disorders: Secondary | ICD-10-CM | POA: Diagnosis not present

## 2012-06-14 DIAGNOSIS — H81399 Other peripheral vertigo, unspecified ear: Secondary | ICD-10-CM | POA: Diagnosis not present

## 2012-06-14 DIAGNOSIS — M542 Cervicalgia: Secondary | ICD-10-CM | POA: Diagnosis not present

## 2012-06-15 DIAGNOSIS — M779 Enthesopathy, unspecified: Secondary | ICD-10-CM | POA: Diagnosis not present

## 2012-06-15 DIAGNOSIS — M719 Bursopathy, unspecified: Secondary | ICD-10-CM | POA: Diagnosis not present

## 2012-06-15 DIAGNOSIS — M67919 Unspecified disorder of synovium and tendon, unspecified shoulder: Secondary | ICD-10-CM | POA: Diagnosis not present

## 2012-06-29 DIAGNOSIS — Z23 Encounter for immunization: Secondary | ICD-10-CM | POA: Diagnosis not present

## 2012-07-13 DIAGNOSIS — G540 Brachial plexus disorders: Secondary | ICD-10-CM | POA: Diagnosis not present

## 2012-07-13 DIAGNOSIS — H81399 Other peripheral vertigo, unspecified ear: Secondary | ICD-10-CM | POA: Diagnosis not present

## 2012-07-13 DIAGNOSIS — Z79899 Other long term (current) drug therapy: Secondary | ICD-10-CM | POA: Diagnosis not present

## 2012-07-13 DIAGNOSIS — G541 Lumbosacral plexus disorders: Secondary | ICD-10-CM | POA: Diagnosis not present

## 2012-07-13 DIAGNOSIS — M542 Cervicalgia: Secondary | ICD-10-CM | POA: Diagnosis not present

## 2012-07-13 DIAGNOSIS — M5030 Other cervical disc degeneration, unspecified cervical region: Secondary | ICD-10-CM | POA: Diagnosis not present

## 2012-08-10 DIAGNOSIS — G540 Brachial plexus disorders: Secondary | ICD-10-CM | POA: Diagnosis not present

## 2012-08-10 DIAGNOSIS — M5030 Other cervical disc degeneration, unspecified cervical region: Secondary | ICD-10-CM | POA: Diagnosis not present

## 2012-08-10 DIAGNOSIS — H81399 Other peripheral vertigo, unspecified ear: Secondary | ICD-10-CM | POA: Diagnosis not present

## 2012-08-10 DIAGNOSIS — G541 Lumbosacral plexus disorders: Secondary | ICD-10-CM | POA: Diagnosis not present

## 2012-09-10 DIAGNOSIS — H81399 Other peripheral vertigo, unspecified ear: Secondary | ICD-10-CM | POA: Diagnosis not present

## 2012-09-10 DIAGNOSIS — M5030 Other cervical disc degeneration, unspecified cervical region: Secondary | ICD-10-CM | POA: Diagnosis not present

## 2012-09-10 DIAGNOSIS — G540 Brachial plexus disorders: Secondary | ICD-10-CM | POA: Diagnosis not present

## 2012-09-10 DIAGNOSIS — G541 Lumbosacral plexus disorders: Secondary | ICD-10-CM | POA: Diagnosis not present

## 2012-09-20 DIAGNOSIS — H526 Other disorders of refraction: Secondary | ICD-10-CM | POA: Diagnosis not present

## 2012-09-20 DIAGNOSIS — H53009 Unspecified amblyopia, unspecified eye: Secondary | ICD-10-CM | POA: Diagnosis not present

## 2012-09-20 DIAGNOSIS — H02839 Dermatochalasis of unspecified eye, unspecified eyelid: Secondary | ICD-10-CM | POA: Diagnosis not present

## 2012-09-20 DIAGNOSIS — H2589 Other age-related cataract: Secondary | ICD-10-CM | POA: Diagnosis not present

## 2012-09-20 DIAGNOSIS — H01009 Unspecified blepharitis unspecified eye, unspecified eyelid: Secondary | ICD-10-CM | POA: Diagnosis not present

## 2012-09-20 DIAGNOSIS — H02409 Unspecified ptosis of unspecified eyelid: Secondary | ICD-10-CM | POA: Diagnosis not present

## 2012-10-06 ENCOUNTER — Encounter: Payer: Self-pay | Admitting: *Deleted

## 2012-10-06 DIAGNOSIS — E119 Type 2 diabetes mellitus without complications: Secondary | ICD-10-CM | POA: Insufficient documentation

## 2012-10-06 DIAGNOSIS — I10 Essential (primary) hypertension: Secondary | ICD-10-CM | POA: Insufficient documentation

## 2012-10-08 DIAGNOSIS — H8190 Unspecified disorder of vestibular function, unspecified ear: Secondary | ICD-10-CM | POA: Diagnosis not present

## 2012-10-20 DIAGNOSIS — H01009 Unspecified blepharitis unspecified eye, unspecified eyelid: Secondary | ICD-10-CM | POA: Diagnosis not present

## 2012-11-05 DIAGNOSIS — G541 Lumbosacral plexus disorders: Secondary | ICD-10-CM | POA: Diagnosis not present

## 2012-11-05 DIAGNOSIS — Z79899 Other long term (current) drug therapy: Secondary | ICD-10-CM | POA: Diagnosis not present

## 2012-11-05 DIAGNOSIS — G540 Brachial plexus disorders: Secondary | ICD-10-CM | POA: Diagnosis not present

## 2012-11-05 DIAGNOSIS — H814 Vertigo of central origin: Secondary | ICD-10-CM | POA: Diagnosis not present

## 2012-11-05 DIAGNOSIS — H81399 Other peripheral vertigo, unspecified ear: Secondary | ICD-10-CM | POA: Diagnosis not present

## 2012-11-05 DIAGNOSIS — M542 Cervicalgia: Secondary | ICD-10-CM | POA: Diagnosis not present

## 2012-12-06 ENCOUNTER — Other Ambulatory Visit (INDEPENDENT_AMBULATORY_CARE_PROVIDER_SITE_OTHER): Payer: Medicare Other

## 2012-12-06 DIAGNOSIS — I10 Essential (primary) hypertension: Secondary | ICD-10-CM | POA: Diagnosis not present

## 2012-12-06 DIAGNOSIS — E785 Hyperlipidemia, unspecified: Secondary | ICD-10-CM

## 2012-12-06 DIAGNOSIS — E119 Type 2 diabetes mellitus without complications: Secondary | ICD-10-CM | POA: Diagnosis not present

## 2012-12-06 DIAGNOSIS — Z0000 Encounter for general adult medical examination without abnormal findings: Secondary | ICD-10-CM

## 2012-12-06 LAB — COMPLETE METABOLIC PANEL WITH GFR
ALT: 44 U/L — ABNORMAL HIGH (ref 0–35)
Alkaline Phosphatase: 39 U/L (ref 39–117)
Creat: 0.93 mg/dL (ref 0.50–1.10)
GFR, Est Non African American: 66 mL/min
Glucose, Bld: 141 mg/dL — ABNORMAL HIGH (ref 70–99)
Sodium: 139 mEq/L (ref 135–145)
Total Bilirubin: 0.4 mg/dL (ref 0.3–1.2)
Total Protein: 7.5 g/dL (ref 6.0–8.3)

## 2012-12-06 LAB — LIPID PANEL
Cholesterol: 81 mg/dL (ref 0–200)
HDL: 40 mg/dL (ref >39)
Total CHOL/HDL Ratio: 2 Ratio
Triglycerides: 81 mg/dL (ref <150)

## 2012-12-06 LAB — CBC WITH DIFFERENTIAL/PLATELET
Basophils Relative: 1 % (ref 0–1)
HCT: 41.9 % (ref 36.0–46.0)
Hemoglobin: 14.5 g/dL (ref 12.0–15.0)
Lymphs Abs: 4.1 10*3/uL — ABNORMAL HIGH (ref 0.7–4.0)
MCHC: 34.6 g/dL (ref 30.0–36.0)
Monocytes Absolute: 0.5 10*3/uL (ref 0.1–1.0)
Monocytes Relative: 7 % (ref 3–12)
Neutro Abs: 3 10*3/uL (ref 1.7–7.7)
RBC: 4.64 MIL/uL (ref 3.87–5.11)

## 2012-12-06 LAB — HEMOGLOBIN A1C
Hgb A1c MFr Bld: 6.4 % — ABNORMAL HIGH (ref <5.7)
Mean Plasma Glucose: 137 mg/dL — ABNORMAL HIGH (ref <117)

## 2012-12-07 DIAGNOSIS — G541 Lumbosacral plexus disorders: Secondary | ICD-10-CM | POA: Diagnosis not present

## 2012-12-07 DIAGNOSIS — G540 Brachial plexus disorders: Secondary | ICD-10-CM | POA: Diagnosis not present

## 2012-12-07 DIAGNOSIS — H81399 Other peripheral vertigo, unspecified ear: Secondary | ICD-10-CM | POA: Diagnosis not present

## 2012-12-07 DIAGNOSIS — H814 Vertigo of central origin: Secondary | ICD-10-CM | POA: Diagnosis not present

## 2012-12-14 ENCOUNTER — Other Ambulatory Visit: Payer: Self-pay | Admitting: Family Medicine

## 2012-12-14 ENCOUNTER — Encounter: Payer: Self-pay | Admitting: Family Medicine

## 2012-12-14 ENCOUNTER — Ambulatory Visit (INDEPENDENT_AMBULATORY_CARE_PROVIDER_SITE_OTHER): Payer: Medicare Other | Admitting: Family Medicine

## 2012-12-14 VITALS — BP 130/70 | HR 76 | Temp 98.2°F | Resp 18 | Wt 194.0 lb

## 2012-12-14 DIAGNOSIS — I10 Essential (primary) hypertension: Secondary | ICD-10-CM

## 2012-12-14 DIAGNOSIS — E119 Type 2 diabetes mellitus without complications: Secondary | ICD-10-CM | POA: Diagnosis not present

## 2012-12-14 DIAGNOSIS — E785 Hyperlipidemia, unspecified: Secondary | ICD-10-CM | POA: Diagnosis not present

## 2012-12-14 MED ORDER — LOSARTAN POTASSIUM 50 MG PO TABS: 50.0000 mg | ORAL_TABLET | Freq: Every day | ORAL | Status: DC

## 2012-12-14 MED ORDER — ROPINIROLE HCL 2 MG PO TABS: 1.0000 mg | ORAL_TABLET | Freq: Two times a day (BID) | ORAL | Status: DC

## 2012-12-14 NOTE — Progress Notes (Signed)
Subjective:    Patient ID: [name] [name], female    DOB: [birth year], 62 y.o.   MRN: [id number]  HPI The patient is here for followup of her diabetes, hypertension, hyperlipidemia. Regards to diabetes she's currently taking metformin 1000 mg by mouth twice a day. She denies any polyuria, polydipsia, or blurred vision. Her hemoglobin A1c is 6.4. She has lost 12 pounds. She's been trying to institute a low carbohydrate low-fat diet. She's also been trying exercise more.  With regards to her hypertension, the patient stopped but started in about a month ago. She is currently taking the other medicines listed. She denies chest pain short of breath or dyspnea on exertion.  She also has a history of coronary artery disease.  With regards to hyperlipidemia, the patient's currently taking Lipitor 40 mg a day. She is really tried to change her diet. Her total cholesterol is 81, her triglycerides are 81, her HDL is 40, and her LDL is less than 30. She denies myalgias or right upper quadrant pain. Past Medical History  Diagnosis Date  . Duodenitis without mention of hemorrhage   . Diverticulosis of colon (without mention of hemorrhage)   . Hemorrhoids   . Anxiety   . CAD (coronary artery disease)   . Arthritis   . Insomnia   . Tinnitus   . Vertigo   . Diabetes mellitus   . Depression   . GERD (gastroesophageal reflux disease)   . Hypercholesteremia   . Hypertension   . Chronic kidney disease     Renal insufficiency   Current Outpatient Prescriptions on File Prior to Visit  Medication Sig Dispense Refill  . atorvastatin (LIPITOR) 40 MG tablet Take 40 mg by mouth daily.      . bifidobacterium infantis (ALIGN) capsule Take 1 capsule by mouth daily.  21 capsule  0  . cyanocobalamin (,VITAMIN B-12,) 1000 MCG/ML injection Inject 1 mL (1,000 mcg total) into the muscle every 30 (thirty) days.  1 mL  12  . cyclobenzaprine (FLEXERIL) 5 MG tablet Take 5 mg by mouth at bedtime.      Marland Kitchen dexlansoprazole  (DEXILANT) 60 MG capsule Take 60 mg by mouth daily.      Marland Kitchen diltiazem (CARDIZEM) 120 MG tablet Take 120 mg by mouth daily.      . fexofenadine (ALLEGRA) 180 MG tablet Take 180 mg by mouth daily.      . hydrochlorothiazide (HYDRODIURIL) 25 MG tablet Take 25 mg by mouth daily.      Marland Kitchen HYDROcodone-acetaminophen (NORCO) 10-325 MG per tablet Take 1 tablet by mouth every 6 (six) hours as needed.      . metFORMIN (GLUCOPHAGE) 1000 MG tablet Take 1,000 mg by mouth 2 (two) times daily with a meal.      . mometasone (NASONEX) 50 MCG/ACT nasal spray Place into the nose.      Marland Kitchen oxymorphone (OPANA ER) 20 MG 12 hr tablet Take 20 mg by mouth every 12 (twelve) hours.       Current Facility-Administered Medications on File Prior to Visit  Medication Dose Route Frequency Provider Last Rate Last Dose  . cyanocobalamin ((VITAMIN B-12)) injection 1,000 mcg  1,000 mcg Intramuscular Q30 days Mardella Layman, MD       Allergies  Allergen Reactions  . Guaifenesin & Derivatives     Severe Reaction  . Benazepril Other (See Comments)    cough  . Latex     REACTION: powder on gloves  . Morphine  REACTION: nausea  . Zyrtec (Cetirizine) Other (See Comments)    swelling   History   Social History  . Marital Status: Married    Spouse Name: N/A    Number of Children: 1  . Years of Education: N/A   Occupational History  . Retired    Social History Main Topics  . Smoking status: Former Smoker    Quit date: 09/11/2008  . Smokeless tobacco: Never Used  . Alcohol Use: No  . Drug Use: No  . Sexually Active: Not on file   Other Topics Concern  . Not on file   Social History Narrative  . No narrative on file      Review of Systems    review of systems is negative Objective:   Physical Exam  Constitutional: She is oriented to person, place, and time. She appears well-developed and well-nourished.  Cardiovascular: Normal rate, regular rhythm, normal heart sounds and intact distal pulses.  Exam  reveals no gallop and no friction rub.   No murmur heard. Pulmonary/Chest: Effort normal and breath sounds normal. No respiratory distress. She has no wheezes. She has no rales. She exhibits no tenderness.  Abdominal: Soft. Bowel sounds are normal. She exhibits no distension. There is no tenderness. There is no rebound and no guarding.  Neurological: She is alert and oriented to person, place, and time. She has normal reflexes. She displays normal reflexes. No cranial nerve deficit. She exhibits normal muscle tone. Coordination normal.  Skin: Skin is warm. No rash noted. No erythema. No pallor.   sensation is 4/4 intact to 10 mg monofilament bilaterally. She has normal pedal pulses        Assessment & Plan:  1. HYPERLIPIDEMIA Cholesterol well controlled. We had a long discussion. The patient elects to discontinue Lipitor and recheck a fasting lipid panel in 3 months.  2. HYPERTENSION Pressures well controlled. However I would like to resume losartan 50 mg by mouth daily. Discontinue hydrochlorothiazide. As she gets secondary benefits of the losartan such as decreased risk of diabetic nephropathy as well as decrease risk of cardiovascular disease.  Continue aspirin 81 mg by mouth daily  3. Type II or unspecified type diabetes mellitus without mention of complication, not stated as uncontrolled Diabetes well controlled. We'll make no changes and recheck labs in 3 months.

## 2012-12-14 NOTE — Telephone Encounter (Signed)
Sent 90 day supply to mail order per pt request

## 2012-12-15 ENCOUNTER — Telehealth: Payer: Self-pay | Admitting: Family Medicine

## 2012-12-15 MED ORDER — ROPINIROLE HCL 2 MG PO TABS: 2.0000 mg | ORAL_TABLET | Freq: Every day | ORAL | Status: DC

## 2012-12-15 MED ORDER — MOMETASONE FUROATE 50 MCG/ACT NA SUSP: 2.0000 | Freq: Every day | NASAL | Status: DC

## 2012-12-15 MED ORDER — LOSARTAN POTASSIUM 50 MG PO TABS: 50.0000 mg | ORAL_TABLET | Freq: Every day | ORAL | Status: DC

## 2012-12-15 MED ORDER — CYCLOBENZAPRINE HCL 5 MG PO TABS: 5.0000 mg | ORAL_TABLET | Freq: Every day | ORAL | Status: DC

## 2012-12-15 MED ORDER — FEXOFENADINE HCL 180 MG PO TABS: 180.0000 mg | ORAL_TABLET | Freq: Every day | ORAL | Status: DC

## 2012-12-15 MED ORDER — ALIGN PO CAPS: 1.0000 | ORAL_CAPSULE | Freq: Every day | ORAL | Status: AC

## 2012-12-15 NOTE — Telephone Encounter (Signed)
Rx Refilled  

## 2012-12-28 ENCOUNTER — Telehealth: Payer: Self-pay | Admitting: Family Medicine

## 2012-12-28 MED ORDER — ROPINIROLE HCL 2 MG PO TABS: 2.0000 mg | ORAL_TABLET | Freq: Every day | ORAL | Status: DC

## 2012-12-28 NOTE — Telephone Encounter (Signed)
Pt called.  She states she only takes the 2mg  Ropinirole once a day at bedtime.

## 2012-12-28 NOTE — Telephone Encounter (Signed)
Call the patient and verify what dose she is on.  The chart says 2 mg by mouth each bedtime. What does she need?

## 2013-01-04 DIAGNOSIS — Z79899 Other long term (current) drug therapy: Secondary | ICD-10-CM | POA: Diagnosis not present

## 2013-01-04 DIAGNOSIS — H81399 Other peripheral vertigo, unspecified ear: Secondary | ICD-10-CM | POA: Diagnosis not present

## 2013-01-04 DIAGNOSIS — G540 Brachial plexus disorders: Secondary | ICD-10-CM | POA: Diagnosis not present

## 2013-01-04 DIAGNOSIS — G541 Lumbosacral plexus disorders: Secondary | ICD-10-CM | POA: Diagnosis not present

## 2013-01-04 DIAGNOSIS — H814 Vertigo of central origin: Secondary | ICD-10-CM | POA: Diagnosis not present

## 2013-01-05 ENCOUNTER — Encounter: Payer: Self-pay | Admitting: Family Medicine

## 2013-01-06 ENCOUNTER — Telehealth: Payer: Self-pay | Admitting: Family Medicine

## 2013-01-06 ENCOUNTER — Encounter: Payer: Self-pay | Admitting: Family Medicine

## 2013-01-06 ENCOUNTER — Other Ambulatory Visit: Payer: Self-pay | Admitting: Family Medicine

## 2013-01-06 MED ORDER — METFORMIN HCL 1000 MG PO TABS: 1000.0000 mg | ORAL_TABLET | Freq: Two times a day (BID) | ORAL | Status: DC

## 2013-01-06 MED ORDER — DEXLANSOPRAZOLE 60 MG PO CPDR: 60.0000 mg | DELAYED_RELEASE_CAPSULE | Freq: Every day | ORAL | Status: DC

## 2013-01-06 MED ORDER — DILTIAZEM HCL 120 MG PO TABS: 120.0000 mg | ORAL_TABLET | Freq: Every day | ORAL | Status: DC

## 2013-01-06 MED ORDER — ROPINIROLE HCL 2 MG PO TABS: 2.0000 mg | ORAL_TABLET | Freq: Every day | ORAL | Status: DC

## 2013-01-06 MED ORDER — GLUCOSE BLOOD VI STRP: ORAL_STRIP | Status: DC

## 2013-01-06 NOTE — Telephone Encounter (Signed)
..  Rx's Refilled sent for 90 days and 30 days to [facility]

## 2013-01-06 NOTE — Telephone Encounter (Signed)
This encounter was created in error - please disregard.

## 2013-01-06 NOTE — Telephone Encounter (Signed)
Can you make erroneous please? Sorry! °

## 2013-01-21 ENCOUNTER — Other Ambulatory Visit (HOSPITAL_COMMUNITY): Payer: Self-pay | Admitting: Pharmacist

## 2013-01-21 ENCOUNTER — Other Ambulatory Visit: Payer: Self-pay | Admitting: Family Medicine

## 2013-01-21 DIAGNOSIS — Z1231 Encounter for screening mammogram for malignant neoplasm of breast: Secondary | ICD-10-CM

## 2013-02-03 DIAGNOSIS — M542 Cervicalgia: Secondary | ICD-10-CM | POA: Diagnosis not present

## 2013-02-03 DIAGNOSIS — H814 Vertigo of central origin: Secondary | ICD-10-CM | POA: Diagnosis not present

## 2013-02-03 DIAGNOSIS — M531 Cervicobrachial syndrome: Secondary | ICD-10-CM | POA: Diagnosis not present

## 2013-02-03 DIAGNOSIS — G541 Lumbosacral plexus disorders: Secondary | ICD-10-CM | POA: Diagnosis not present

## 2013-02-03 DIAGNOSIS — G540 Brachial plexus disorders: Secondary | ICD-10-CM | POA: Diagnosis not present

## 2013-02-03 DIAGNOSIS — H81399 Other peripheral vertigo, unspecified ear: Secondary | ICD-10-CM | POA: Diagnosis not present

## 2013-02-03 DIAGNOSIS — Z79899 Other long term (current) drug therapy: Secondary | ICD-10-CM | POA: Diagnosis not present

## 2013-02-23 ENCOUNTER — Ambulatory Visit (HOSPITAL_COMMUNITY)
Admission: RE | Admit: 2013-02-23 | Discharge: 2013-02-23 | Disposition: A | Discharge status: Home / Self Care | Payer: Medicare Other | Source: Ambulatory Visit | Attending: Family Medicine | Admitting: Family Medicine

## 2013-02-23 DIAGNOSIS — Z1231 Encounter for screening mammogram for malignant neoplasm of breast: Secondary | ICD-10-CM | POA: Diagnosis not present

## 2013-03-04 DIAGNOSIS — Z79899 Other long term (current) drug therapy: Secondary | ICD-10-CM | POA: Diagnosis not present

## 2013-03-04 DIAGNOSIS — M542 Cervicalgia: Secondary | ICD-10-CM | POA: Diagnosis not present

## 2013-03-04 DIAGNOSIS — H81399 Other peripheral vertigo, unspecified ear: Secondary | ICD-10-CM | POA: Diagnosis not present

## 2013-03-04 DIAGNOSIS — H814 Vertigo of central origin: Secondary | ICD-10-CM | POA: Diagnosis not present

## 2013-03-04 DIAGNOSIS — G541 Lumbosacral plexus disorders: Secondary | ICD-10-CM | POA: Diagnosis not present

## 2013-03-04 DIAGNOSIS — G540 Brachial plexus disorders: Secondary | ICD-10-CM | POA: Diagnosis not present

## 2013-04-01 DIAGNOSIS — H81399 Other peripheral vertigo, unspecified ear: Secondary | ICD-10-CM | POA: Diagnosis not present

## 2013-04-01 DIAGNOSIS — G540 Brachial plexus disorders: Secondary | ICD-10-CM | POA: Diagnosis not present

## 2013-04-01 DIAGNOSIS — G541 Lumbosacral plexus disorders: Secondary | ICD-10-CM | POA: Diagnosis not present

## 2013-04-01 DIAGNOSIS — H814 Vertigo of central origin: Secondary | ICD-10-CM | POA: Diagnosis not present

## 2013-04-29 DIAGNOSIS — M545 Low back pain: Secondary | ICD-10-CM | POA: Diagnosis not present

## 2013-04-29 DIAGNOSIS — H814 Vertigo of central origin: Secondary | ICD-10-CM | POA: Diagnosis not present

## 2013-04-29 DIAGNOSIS — M542 Cervicalgia: Secondary | ICD-10-CM | POA: Diagnosis not present

## 2013-04-29 DIAGNOSIS — G540 Brachial plexus disorders: Secondary | ICD-10-CM | POA: Diagnosis not present

## 2013-04-29 DIAGNOSIS — H81399 Other peripheral vertigo, unspecified ear: Secondary | ICD-10-CM | POA: Diagnosis not present

## 2013-04-29 DIAGNOSIS — G541 Lumbosacral plexus disorders: Secondary | ICD-10-CM | POA: Diagnosis not present

## 2013-05-24 ENCOUNTER — Other Ambulatory Visit: Payer: Self-pay | Admitting: Family Medicine

## 2013-05-24 MED ORDER — VARENICLINE TARTRATE 0.5 MG X 11 & 1 MG X 42 PO MISC: ORAL | Status: DC

## 2013-05-25 DIAGNOSIS — H81399 Other peripheral vertigo, unspecified ear: Secondary | ICD-10-CM | POA: Diagnosis not present

## 2013-05-25 DIAGNOSIS — G541 Lumbosacral plexus disorders: Secondary | ICD-10-CM | POA: Diagnosis not present

## 2013-05-25 DIAGNOSIS — G540 Brachial plexus disorders: Secondary | ICD-10-CM | POA: Diagnosis not present

## 2013-05-25 DIAGNOSIS — H814 Vertigo of central origin: Secondary | ICD-10-CM | POA: Diagnosis not present

## 2013-06-16 ENCOUNTER — Other Ambulatory Visit: Payer: Self-pay

## 2013-06-28 DIAGNOSIS — M545 Low back pain: Secondary | ICD-10-CM | POA: Diagnosis not present

## 2013-06-28 DIAGNOSIS — G540 Brachial plexus disorders: Secondary | ICD-10-CM | POA: Diagnosis not present

## 2013-06-28 DIAGNOSIS — M5030 Other cervical disc degeneration, unspecified cervical region: Secondary | ICD-10-CM | POA: Diagnosis not present

## 2013-06-28 DIAGNOSIS — H81399 Other peripheral vertigo, unspecified ear: Secondary | ICD-10-CM | POA: Diagnosis not present

## 2013-06-28 DIAGNOSIS — G541 Lumbosacral plexus disorders: Secondary | ICD-10-CM | POA: Diagnosis not present

## 2013-06-28 DIAGNOSIS — Z79899 Other long term (current) drug therapy: Secondary | ICD-10-CM | POA: Diagnosis not present

## 2013-06-28 DIAGNOSIS — M5137 Other intervertebral disc degeneration, lumbosacral region: Secondary | ICD-10-CM | POA: Diagnosis not present

## 2013-06-28 DIAGNOSIS — H814 Vertigo of central origin: Secondary | ICD-10-CM | POA: Diagnosis not present

## 2013-06-28 DIAGNOSIS — M542 Cervicalgia: Secondary | ICD-10-CM | POA: Diagnosis not present

## 2013-06-28 DIAGNOSIS — H905 Unspecified sensorineural hearing loss: Secondary | ICD-10-CM | POA: Diagnosis not present

## 2013-07-26 DIAGNOSIS — M5030 Other cervical disc degeneration, unspecified cervical region: Secondary | ICD-10-CM | POA: Diagnosis not present

## 2013-07-26 DIAGNOSIS — M545 Low back pain: Secondary | ICD-10-CM | POA: Diagnosis not present

## 2013-07-26 DIAGNOSIS — M533 Sacrococcygeal disorders, not elsewhere classified: Secondary | ICD-10-CM | POA: Diagnosis not present

## 2013-07-26 DIAGNOSIS — S335XXA Sprain of ligaments of lumbar spine, initial encounter: Secondary | ICD-10-CM | POA: Diagnosis not present

## 2013-07-26 DIAGNOSIS — M5137 Other intervertebral disc degeneration, lumbosacral region: Secondary | ICD-10-CM | POA: Diagnosis not present

## 2013-08-12 DIAGNOSIS — Z23 Encounter for immunization: Secondary | ICD-10-CM | POA: Diagnosis not present

## 2013-08-23 DIAGNOSIS — M5030 Other cervical disc degeneration, unspecified cervical region: Secondary | ICD-10-CM | POA: Diagnosis not present

## 2013-08-23 DIAGNOSIS — M5137 Other intervertebral disc degeneration, lumbosacral region: Secondary | ICD-10-CM | POA: Diagnosis not present

## 2013-08-23 DIAGNOSIS — M533 Sacrococcygeal disorders, not elsewhere classified: Secondary | ICD-10-CM | POA: Diagnosis not present

## 2013-08-23 DIAGNOSIS — M545 Low back pain, unspecified: Secondary | ICD-10-CM | POA: Diagnosis not present

## 2013-08-23 DIAGNOSIS — Z79899 Other long term (current) drug therapy: Secondary | ICD-10-CM | POA: Diagnosis not present

## 2013-08-23 DIAGNOSIS — M62838 Other muscle spasm: Secondary | ICD-10-CM | POA: Diagnosis not present

## 2013-08-23 DIAGNOSIS — S335XXA Sprain of ligaments of lumbar spine, initial encounter: Secondary | ICD-10-CM | POA: Diagnosis not present

## 2013-08-23 DIAGNOSIS — S139XXA Sprain of joints and ligaments of unspecified parts of neck, initial encounter: Secondary | ICD-10-CM | POA: Diagnosis not present

## 2013-08-23 DIAGNOSIS — M542 Cervicalgia: Secondary | ICD-10-CM | POA: Diagnosis not present

## 2013-09-21 DIAGNOSIS — M545 Low back pain, unspecified: Secondary | ICD-10-CM | POA: Diagnosis not present

## 2013-09-21 DIAGNOSIS — M5430 Sciatica, unspecified side: Secondary | ICD-10-CM | POA: Diagnosis not present

## 2013-09-21 DIAGNOSIS — M533 Sacrococcygeal disorders, not elsewhere classified: Secondary | ICD-10-CM | POA: Diagnosis not present

## 2013-10-24 DIAGNOSIS — Z79899 Other long term (current) drug therapy: Secondary | ICD-10-CM | POA: Diagnosis not present

## 2013-10-24 DIAGNOSIS — M5430 Sciatica, unspecified side: Secondary | ICD-10-CM | POA: Diagnosis not present

## 2013-10-24 DIAGNOSIS — M5412 Radiculopathy, cervical region: Secondary | ICD-10-CM | POA: Diagnosis not present

## 2013-10-24 DIAGNOSIS — M545 Low back pain, unspecified: Secondary | ICD-10-CM | POA: Diagnosis not present

## 2013-10-24 DIAGNOSIS — M533 Sacrococcygeal disorders, not elsewhere classified: Secondary | ICD-10-CM | POA: Diagnosis not present

## 2013-10-24 DIAGNOSIS — M542 Cervicalgia: Secondary | ICD-10-CM | POA: Diagnosis not present

## 2013-11-02 ENCOUNTER — Other Ambulatory Visit: Payer: Medicare Other

## 2013-11-02 DIAGNOSIS — Z79899 Other long term (current) drug therapy: Secondary | ICD-10-CM | POA: Diagnosis not present

## 2013-11-02 DIAGNOSIS — E119 Type 2 diabetes mellitus without complications: Secondary | ICD-10-CM

## 2013-11-02 DIAGNOSIS — E785 Hyperlipidemia, unspecified: Secondary | ICD-10-CM | POA: Diagnosis not present

## 2013-11-02 DIAGNOSIS — I10 Essential (primary) hypertension: Secondary | ICD-10-CM | POA: Diagnosis not present

## 2013-11-02 LAB — CBC WITH DIFFERENTIAL/PLATELET
Basophils Absolute: 0 10*3/uL (ref 0.0–0.1)
Basophils Relative: 0 % (ref 0–1)
Eosinophils Absolute: 0.3 10*3/uL (ref 0.0–0.7)
Eosinophils Relative: 4 % (ref 0–5)
HCT: 41.8 % (ref 36.0–46.0)
HEMOGLOBIN: 14 g/dL (ref 12.0–15.0)
LYMPHS ABS: 3.6 10*3/uL (ref 0.7–4.0)
LYMPHS PCT: 45 % (ref 12–46)
MCH: 30.3 pg (ref 26.0–34.0)
MCHC: 33.5 g/dL (ref 30.0–36.0)
MCV: 90.5 fL (ref 78.0–100.0)
MONOS PCT: 8 % (ref 3–12)
Monocytes Absolute: 0.6 10*3/uL (ref 0.1–1.0)
NEUTROS ABS: 3.4 10*3/uL (ref 1.7–7.7)
NEUTROS PCT: 43 % (ref 43–77)
Platelets: 338 10*3/uL (ref 150–400)
RBC: 4.62 MIL/uL (ref 3.87–5.11)
RDW: 14.1 % (ref 11.5–15.5)
WBC: 8 10*3/uL (ref 4.0–10.5)

## 2013-11-02 LAB — HEMOGLOBIN A1C
Hgb A1c MFr Bld: 6.4 % — ABNORMAL HIGH (ref <5.7)
Mean Plasma Glucose: 137 mg/dL — ABNORMAL HIGH (ref <117)

## 2013-11-02 LAB — COMPREHENSIVE METABOLIC PANEL
ALBUMIN: 3.9 g/dL (ref 3.5–5.2)
ALK PHOS: 47 U/L (ref 39–117)
ALT: 23 U/L (ref 0–35)
AST: 18 U/L (ref 0–37)
BUN: 9 mg/dL (ref 6–23)
CHLORIDE: 99 meq/L (ref 96–112)
CO2: 29 meq/L (ref 19–32)
Calcium: 9.4 mg/dL (ref 8.4–10.5)
Creat: 0.8 mg/dL (ref 0.50–1.10)
GLUCOSE: 118 mg/dL — AB (ref 70–99)
POTASSIUM: 4.5 meq/L (ref 3.5–5.3)
SODIUM: 138 meq/L (ref 135–145)
TOTAL PROTEIN: 6.9 g/dL (ref 6.0–8.3)
Total Bilirubin: 0.3 mg/dL (ref 0.2–1.2)

## 2013-11-02 LAB — LIPID PANEL
Cholesterol: 154 mg/dL (ref 0–200)
HDL: 41 mg/dL (ref >39)
LDL CALC: 94 mg/dL (ref 0–99)
TRIGLYCERIDES: 95 mg/dL (ref <150)
Total CHOL/HDL Ratio: 3.8 Ratio
VLDL: 19 mg/dL (ref 0–40)

## 2013-11-08 ENCOUNTER — Ambulatory Visit: Payer: Medicare Other | Admitting: Family Medicine

## 2013-11-09 ENCOUNTER — Other Ambulatory Visit: Payer: Self-pay | Admitting: Family Medicine

## 2013-11-10 ENCOUNTER — Ambulatory Visit (INDEPENDENT_AMBULATORY_CARE_PROVIDER_SITE_OTHER): Payer: Medicare Other | Admitting: Family Medicine

## 2013-11-10 ENCOUNTER — Encounter: Payer: Self-pay | Admitting: Family Medicine

## 2013-11-10 VITALS — BP 140/82 | HR 66 | Temp 97.0°F | Resp 20 | Ht 64.0 in | Wt 191.0 lb

## 2013-11-10 DIAGNOSIS — I10 Essential (primary) hypertension: Secondary | ICD-10-CM

## 2013-11-10 DIAGNOSIS — E785 Hyperlipidemia, unspecified: Secondary | ICD-10-CM | POA: Diagnosis not present

## 2013-11-10 DIAGNOSIS — Z79899 Other long term (current) drug therapy: Secondary | ICD-10-CM | POA: Diagnosis not present

## 2013-11-10 DIAGNOSIS — E119 Type 2 diabetes mellitus without complications: Secondary | ICD-10-CM

## 2013-11-10 DIAGNOSIS — R5381 Other malaise: Secondary | ICD-10-CM | POA: Diagnosis not present

## 2013-11-10 DIAGNOSIS — R5383 Other fatigue: Secondary | ICD-10-CM | POA: Diagnosis not present

## 2013-11-10 LAB — TSH: TSH: 1.91 u[IU]/mL (ref 0.350–4.500)

## 2013-11-10 LAB — MICROALBUMIN, URINE: Microalb, Ur: 0.72 mg/dL (ref 0.00–1.89)

## 2013-11-10 LAB — VITAMIN B12: VITAMIN B 12: 195 pg/mL — AB (ref 211–911)

## 2013-11-10 NOTE — Progress Notes (Signed)
Subjective:    Patient ID: [name] [name], female    DOB: 02/27/1951, 63 y.o.   MRN: [id number]  HPI Patient is here today for followup of her hypertension, hyperlipidemia, and coronary artery disease along with her type 2 diabetes mellitus. Her biggest concern is fatigue. She reports no energy or no desire. Her husband recently suffered a stroke and she has been under a lot of stress. She has no symptoms of sleep apnea. He does have a remote history of B12 deficiency. She also reports some alopecia. He has attributed this to losartan and therefore she discontinued the medication. The alopecia has not improved off the less hard and. The alopecia is occurring in a central pattern on the top of her head in a androgenic distribution.  She denies any myalgia or right upper quadrant pain she denies any chest pain shortness of breath dyspnea on exertion or angina. She denies any polyuria, polydipsia, or blurred vision. Her most recent lab work is listed below: Lab on 11/02/2013  Component Date Value Ref Range Status  . WBC 11/02/2013 8.0  4.0 - 10.5 K/uL Final  . RBC 11/02/2013 4.62  3.87 - 5.11 MIL/uL Final  . Hemoglobin 11/02/2013 14.0  12.0 - 15.0 g/dL Final  . HCT 11/02/2013 41.8  36.0 - 46.0 % Final  . MCV 11/02/2013 90.5  78.0 - 100.0 fL Final  . MCH 11/02/2013 30.3  26.0 - 34.0 pg Final  . MCHC 11/02/2013 33.5  30.0 - 36.0 g/dL Final  . RDW 11/02/2013 14.1  11.5 - 15.5 % Final  . Platelets 11/02/2013 338  150 - 400 K/uL Final  . Neutrophils Relative % 11/02/2013 43  43 - 77 % Final  . Neutro Abs 11/02/2013 3.4  1.7 - 7.7 K/uL Final  . Lymphocytes Relative 11/02/2013 45  12 - 46 % Final  . Lymphs Abs 11/02/2013 3.6  0.7 - 4.0 K/uL Final  . Monocytes Relative 11/02/2013 8  3 - 12 % Final  . Monocytes Absolute 11/02/2013 0.6  0.1 - 1.0 K/uL Final  . Eosinophils Relative 11/02/2013 4  0 - 5 % Final  . Eosinophils Absolute 11/02/2013 0.3  0.0 - 0.7 K/uL Final  . Basophils Relative 11/02/2013 0   0 - 1 % Final  . Basophils Absolute 11/02/2013 0.0  0.0 - 0.1 K/uL Final  . Smear Review 11/02/2013 Criteria for review not met   Final  . Sodium 11/02/2013 138  135 - 145 mEq/L Final  . Potassium 11/02/2013 4.5  3.5 - 5.3 mEq/L Final  . Chloride 11/02/2013 99  96 - 112 mEq/L Final  . CO2 11/02/2013 29  19 - 32 mEq/L Final  . Glucose, Bld 11/02/2013 118* 70 - 99 mg/dL Final  . BUN 11/02/2013 9  6 - 23 mg/dL Final  . Creat 11/02/2013 0.80  0.50 - 1.10 mg/dL Final  . Total Bilirubin 11/02/2013 0.3  0.2 - 1.2 mg/dL Final  . Alkaline Phosphatase 11/02/2013 47  39 - 117 U/L Final  . AST 11/02/2013 18  0 - 37 U/L Final  . ALT 11/02/2013 23  0 - 35 U/L Final  . Total Protein 11/02/2013 6.9  6.0 - 8.3 g/dL Final  . Albumin 11/02/2013 3.9  3.5 - 5.2 g/dL Final  . Calcium 11/02/2013 9.4  8.4 - 10.5 mg/dL Final  . Cholesterol 11/02/2013 154  0 - 200 mg/dL Final   Comment: ATP III Classification:                                <  200        mg/dL        Desirable                               200 - 239     mg/dL        Borderline High                               >= 240        mg/dL        High                             . Triglycerides 11/02/2013 95  <150 mg/dL Final  . HDL 11/02/2013 41  >39 mg/dL Final  . Total CHOL/HDL Ratio 11/02/2013 3.8   Final  . VLDL 11/02/2013 19  0 - 40 mg/dL Final  . LDL Cholesterol 11/02/2013 94  0 - 99 mg/dL Final   Comment:                            Total Cholesterol/HDL Ratio:CHD Risk                                                 Coronary Heart Disease Risk Table                                                                 Men       Women                                   1/2 Average Risk              3.4        3.3                                       Average Risk              5.0        4.4                                    2X Average Risk              9.6        7.1                                    3X Average Risk             23.4       11.0  Use the calculated Patient Ratio above and the CHD Risk table                           to determine the patient's CHD Risk.                          ATP III Classification (LDL):                                < 100        mg/dL         Optimal                               100 - 129     mg/dL         Near or Above Optimal                               130 - 159     mg/dL         Borderline High                               160 - 189     mg/dL         High                                > 190        mg/dL         Very High                             . Hemoglobin A1C 11/02/2013 6.4* <5.7 % Final   Comment:                                                                                                 According to the ADA Clinical Practice Recommendations for 2011, when                          HbA1c is used as a screening test:                                                       >=6.5%   Diagnostic of Diabetes Mellitus                                     (if abnormal result is confirmed)  5.7-6.4%   Increased risk of developing Diabetes Mellitus                                                     References:Diagnosis and Classification of Diabetes Mellitus,Diabetes                          KHVF,4734,03(JQDUK 1):S62-S69 and Standards of Medical Care in                                  Diabetes - 2011,Diabetes RCVK,1840,37 (Suppl 1):S11-S61.                             . Mean Plasma Glucose 11/02/2013 137* <117 mg/dL Final   Past Medical History  Diagnosis Date  . Duodenitis without mention of hemorrhage   . Diverticulosis of colon (without mention of hemorrhage)   . Hemorrhoids   . Anxiety   . CAD (coronary artery disease)   . Arthritis   . Insomnia   . Tinnitus   . Vertigo   . Diabetes mellitus   . Depression   . GERD (gastroesophageal reflux disease)   . Hypercholesteremia   . Hypertension   .  Chronic kidney disease     Renal insufficiency   Current Outpatient Prescriptions on File Prior to Visit  Medication Sig Dispense Refill  . aspirin 325 MG tablet Take 325 mg by mouth daily.      . bifidobacterium infantis (ALIGN) capsule Take 1 capsule by mouth daily.  90 capsule  3  . CHANTIX STARTING MONTH PAK 0.5 MG X 11 & 1 MG X 42 tablet USE AS DIRECTED  53 tablet  0  . dexlansoprazole (DEXILANT) 60 MG capsule Take 1 capsule (60 mg total) by mouth daily.  30 capsule  0  . diltiazem (CARDIZEM) 120 MG tablet Take 1 tablet (120 mg total) by mouth daily.  30 tablet  0  . fexofenadine (ALLEGRA) 180 MG tablet Take 1 tablet (180 mg total) by mouth daily.  90 tablet  3  . glucose blood (FREESTYLE LITE) test strip Pt checks BS bid - also needs lancets disp: 1 box with prn refills  100 each  11  . HYDROcodone-acetaminophen (NORCO) 10-325 MG per tablet Take 1 tablet by mouth every 6 (six) hours as needed.      . metFORMIN (GLUCOPHAGE) 1000 MG tablet Take 1 tablet (1,000 mg total) by mouth 2 (two) times daily with a meal.  60 tablet  0  . mometasone (NASONEX) 50 MCG/ACT nasal spray Place 2 sprays into the nose daily. Please dispense 3 bottles for a 3 month supply  17 g  3  . oxymorphone (OPANA ER) 20 MG 12 hr tablet Take 20 mg by mouth every 12 (twelve) hours.      Marland Kitchen rOPINIRole (REQUIP) 2 MG tablet Take 1 tablet (2 mg total) by mouth at bedtime.  90 tablet  3  . losartan (COZAAR) 50 MG tablet Take 1 tablet (50 mg total) by mouth daily.  90 tablet  3   No current facility-administered medications on file prior to visit.   Allergies  Allergen Reactions  . Guaifenesin & Derivatives  Severe Reaction  . Benazepril Other (See Comments)    cough  . Latex     REACTION: powder on gloves  . Morphine     REACTION: nausea  . Zyrtec [Cetirizine] Other (See Comments)    swelling   History   Social History  . Marital Status: Married    Spouse Name: N/A    Number of Children: 1  . Years of  Education: N/A   Occupational History  . Retired    Social History Main Topics  . Smoking status: Former Smoker    Quit date: 09/11/2008  . Smokeless tobacco: Never Used  . Alcohol Use: No  . Drug Use: No  . Sexual Activity: Not on file   Other Topics Concern  . Not on file   Social History Narrative  . No narrative on file      Review of Systems  All other systems reviewed and are negative.       Objective:   Physical Exam  Vitals reviewed. Constitutional: She is oriented to person, place, and time. She appears well-developed and well-nourished.  HENT:  Mouth/Throat: Oropharynx is clear and moist.  Eyes: Conjunctivae are normal. Pupils are equal, round, and reactive to light.  Neck: Neck supple. No thyromegaly present.  Cardiovascular: Normal rate, regular rhythm, normal heart sounds and intact distal pulses.  Exam reveals no gallop and no friction rub.   No murmur heard. Pulmonary/Chest: Effort normal and breath sounds normal. No respiratory distress. She has no wheezes. She has no rales. She exhibits no tenderness.  Abdominal: Soft. Bowel sounds are normal. She exhibits no distension and no mass. There is no tenderness. There is no rebound and no guarding.  Musculoskeletal: Normal range of motion. She exhibits no edema and no tenderness.  Lymphadenopathy:    She has no cervical adenopathy.  Neurological: She is alert and oriented to person, place, and time. She has normal reflexes. She displays normal reflexes. She exhibits normal muscle tone. Coordination normal.          Assessment & Plan:  1. Other malaise and fatigue This is possibly due to stress however I will check a TSH and vitamin B12. If the patient has B12 deficiency I would resume her be B12 injections. 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 I recommended Rogaine for women over-the-counter. - TSH - Vitamin B12  2. Type II or unspecified type diabetes mellitus without  mention of complication, not stated as uncontrolled Hemoglobin A1c is well controlled.  I recommended the patient resume losartan 50 mg by mouth daily for her blood pressure and also check a urine microalbumin. - Microalbumin, urine  3. HTN (hypertension) Blood pressure is borderline. Therefore recommend she resume losartan 50 mg by mouth daily.  4. HLD (hyperlipidemia) LDL cholesterol is less than 100.

## 2013-11-11 ENCOUNTER — Other Ambulatory Visit: Payer: Self-pay | Admitting: Family Medicine

## 2013-11-11 MED ORDER — LOSARTAN POTASSIUM 50 MG PO TABS: 50.0000 mg | ORAL_TABLET | Freq: Every day | ORAL | Status: DC

## 2013-11-11 MED ORDER — MOMETASONE FUROATE 50 MCG/ACT NA SUSP: 2.0000 | Freq: Every day | NASAL | Status: DC

## 2013-11-11 MED ORDER — DILTIAZEM HCL 120 MG PO TABS: 120.0000 mg | ORAL_TABLET | Freq: Every day | ORAL | Status: DC

## 2013-11-11 MED ORDER — DEXLANSOPRAZOLE 60 MG PO CPDR: 60.0000 mg | DELAYED_RELEASE_CAPSULE | Freq: Every day | ORAL | Status: DC

## 2013-11-11 MED ORDER — ROPINIROLE HCL 2 MG PO TABS: 2.0000 mg | ORAL_TABLET | Freq: Every day | ORAL | Status: DC

## 2013-11-11 MED ORDER — METFORMIN HCL 1000 MG PO TABS: 1000.0000 mg | ORAL_TABLET | Freq: Two times a day (BID) | ORAL | Status: DC

## 2013-11-14 ENCOUNTER — Telehealth: Payer: Self-pay | Admitting: *Deleted

## 2013-11-14 ENCOUNTER — Telehealth: Payer: Self-pay | Admitting: Family Medicine

## 2013-11-14 MED ORDER — NALTREXONE-BUPROPION HCL ER 8-90 MG PO TB12: ORAL_TABLET | ORAL | Status: DC

## 2013-11-14 NOTE — Telephone Encounter (Signed)
Message copied by Alyson Locket on Mon Nov 14, 2013  9:55 AM ------      Message from: Jenna Luo      Created: Mon Nov 14, 2013  8:44 AM       contrave 2 tabs pobid      1 poqam for 1 week, then 1 pobid for 1 week, then 2 am and 1 pm for 1 week, then 2 pobid thereafter.       ----- Message -----         From: Daylene Posey, CMA         Sent: 11/10/2013  11:23 AM           To: Susy Frizzle, MD            Pt called back stating that she wants to go ahead and try the Contrave medication that you wanted her call insurance about and see will cover. Please call in script to [facility] [address]       ------

## 2013-11-14 NOTE — Telephone Encounter (Signed)
Med requested sent to pharm 

## 2013-11-14 NOTE — Telephone Encounter (Signed)
Patient requesting to have prescription for Contrave sent to [facility] to send?

## 2013-11-15 NOTE — Telephone Encounter (Signed)
Prescription sent to pharmacy on 11/14/2013.

## 2013-11-15 NOTE — Telephone Encounter (Signed)
Yes, sandy has message already I thought.

## 2013-11-18 ENCOUNTER — Ambulatory Visit (INDEPENDENT_AMBULATORY_CARE_PROVIDER_SITE_OTHER): Payer: Medicare Other | Admitting: Family Medicine

## 2013-11-18 DIAGNOSIS — E538 Deficiency of other specified B group vitamins: Secondary | ICD-10-CM | POA: Diagnosis not present

## 2013-11-18 MED ORDER — CYANOCOBALAMIN 1000 MCG/ML IJ SOLN
1000.0000 ug | Freq: Once | INTRAMUSCULAR | Status: AC
  Administered 2013-11-18: 1000 ug via INTRAMUSCULAR

## 2013-11-21 DIAGNOSIS — M545 Low back pain, unspecified: Secondary | ICD-10-CM | POA: Diagnosis not present

## 2013-11-21 DIAGNOSIS — Z79899 Other long term (current) drug therapy: Secondary | ICD-10-CM | POA: Diagnosis not present

## 2013-11-21 DIAGNOSIS — M542 Cervicalgia: Secondary | ICD-10-CM | POA: Diagnosis not present

## 2013-12-01 ENCOUNTER — Telehealth: Payer: Self-pay | Admitting: Family Medicine

## 2013-12-01 NOTE — Telephone Encounter (Signed)
Message copied by Alyson Locket on Thu Dec 01, 2013 10:29 AM ------      Message from: Devoria Glassing      Created: Thu Dec 01, 2013  8:31 AM       Alyse Low from [place] [facility] calling about the patients medication that we sent in for Contrave she says it interacts with one of her pain medications please call her at [phone] [facility] ------

## 2013-12-01 NOTE — Telephone Encounter (Signed)
Spoke to Reddell at [facility] and she states that the pt is on Opana and hydrocodone from her pain doctor and the [place] has something in it that will block the effects of those mediations. Christy informed pt of this and pt is requesting something else for weight loss.

## 2013-12-02 NOTE — Telephone Encounter (Signed)
Try adipex 37.5 mg poqam #30, 0 rf.

## 2013-12-05 NOTE — Telephone Encounter (Signed)
Med called to pharm and pt aware 

## 2013-12-15 ENCOUNTER — Ambulatory Visit (INDEPENDENT_AMBULATORY_CARE_PROVIDER_SITE_OTHER): Payer: Medicare Other | Admitting: *Deleted

## 2013-12-15 DIAGNOSIS — E538 Deficiency of other specified B group vitamins: Secondary | ICD-10-CM | POA: Diagnosis not present

## 2013-12-15 MED ORDER — CYANOCOBALAMIN 1000 MCG/ML IJ SOLN
1000.0000 ug | Freq: Once | INTRAMUSCULAR | Status: AC
  Administered 2013-12-15: 1000 ug via INTRAMUSCULAR

## 2013-12-28 DIAGNOSIS — G894 Chronic pain syndrome: Secondary | ICD-10-CM | POA: Diagnosis not present

## 2013-12-28 DIAGNOSIS — M545 Low back pain, unspecified: Secondary | ICD-10-CM | POA: Diagnosis not present

## 2013-12-28 DIAGNOSIS — Z79899 Other long term (current) drug therapy: Secondary | ICD-10-CM | POA: Diagnosis not present

## 2013-12-28 DIAGNOSIS — M5430 Sciatica, unspecified side: Secondary | ICD-10-CM | POA: Diagnosis not present

## 2013-12-28 DIAGNOSIS — M542 Cervicalgia: Secondary | ICD-10-CM | POA: Diagnosis not present

## 2013-12-28 DIAGNOSIS — M5412 Radiculopathy, cervical region: Secondary | ICD-10-CM | POA: Diagnosis not present

## 2013-12-28 DIAGNOSIS — M533 Sacrococcygeal disorders, not elsewhere classified: Secondary | ICD-10-CM | POA: Diagnosis not present

## 2014-01-09 ENCOUNTER — Other Ambulatory Visit: Payer: Self-pay | Admitting: Family Medicine

## 2014-01-09 NOTE — Telephone Encounter (Signed)
?   OK to Refill  

## 2014-01-09 NOTE — Telephone Encounter (Signed)
rx called in

## 2014-01-09 NOTE — Telephone Encounter (Signed)
Ok x 2  

## 2014-01-11 ENCOUNTER — Other Ambulatory Visit: Payer: Self-pay | Admitting: Family Medicine

## 2014-01-25 DIAGNOSIS — G9009 Other idiopathic peripheral autonomic neuropathy: Secondary | ICD-10-CM | POA: Diagnosis not present

## 2014-01-25 DIAGNOSIS — M545 Low back pain, unspecified: Secondary | ICD-10-CM | POA: Diagnosis not present

## 2014-01-25 DIAGNOSIS — G541 Lumbosacral plexus disorders: Secondary | ICD-10-CM | POA: Diagnosis not present

## 2014-01-25 DIAGNOSIS — G9050 Complex regional pain syndrome I, unspecified: Secondary | ICD-10-CM | POA: Diagnosis not present

## 2014-01-25 DIAGNOSIS — G894 Chronic pain syndrome: Secondary | ICD-10-CM | POA: Diagnosis not present

## 2014-01-25 DIAGNOSIS — M5412 Radiculopathy, cervical region: Secondary | ICD-10-CM | POA: Diagnosis not present

## 2014-01-25 DIAGNOSIS — Z79899 Other long term (current) drug therapy: Secondary | ICD-10-CM | POA: Diagnosis not present

## 2014-01-25 DIAGNOSIS — M542 Cervicalgia: Secondary | ICD-10-CM | POA: Diagnosis not present

## 2014-01-30 ENCOUNTER — Other Ambulatory Visit: Payer: Self-pay | Admitting: Family Medicine

## 2014-01-30 DIAGNOSIS — Z1231 Encounter for screening mammogram for malignant neoplasm of breast: Secondary | ICD-10-CM

## 2014-02-18 ENCOUNTER — Other Ambulatory Visit: Payer: Self-pay | Admitting: *Deleted

## 2014-02-18 DIAGNOSIS — I10 Essential (primary) hypertension: Secondary | ICD-10-CM

## 2014-02-18 DIAGNOSIS — E785 Hyperlipidemia, unspecified: Secondary | ICD-10-CM

## 2014-02-18 DIAGNOSIS — E119 Type 2 diabetes mellitus without complications: Secondary | ICD-10-CM

## 2014-02-21 DIAGNOSIS — G894 Chronic pain syndrome: Secondary | ICD-10-CM | POA: Diagnosis not present

## 2014-02-21 DIAGNOSIS — M533 Sacrococcygeal disorders, not elsewhere classified: Secondary | ICD-10-CM | POA: Diagnosis not present

## 2014-02-21 DIAGNOSIS — M545 Low back pain, unspecified: Secondary | ICD-10-CM | POA: Diagnosis not present

## 2014-02-21 DIAGNOSIS — Z79899 Other long term (current) drug therapy: Secondary | ICD-10-CM | POA: Diagnosis not present

## 2014-02-21 DIAGNOSIS — M5137 Other intervertebral disc degeneration, lumbosacral region: Secondary | ICD-10-CM | POA: Diagnosis not present

## 2014-02-23 ENCOUNTER — Other Ambulatory Visit: Payer: Medicare Other

## 2014-02-23 DIAGNOSIS — E785 Hyperlipidemia, unspecified: Secondary | ICD-10-CM | POA: Diagnosis not present

## 2014-02-23 DIAGNOSIS — E119 Type 2 diabetes mellitus without complications: Secondary | ICD-10-CM

## 2014-02-23 DIAGNOSIS — I10 Essential (primary) hypertension: Secondary | ICD-10-CM

## 2014-02-23 LAB — COMPLETE METABOLIC PANEL WITH GFR
ALK PHOS: 54 U/L (ref 39–117)
ALT: 24 U/L (ref 0–35)
AST: 23 U/L (ref 0–37)
Albumin: 3.8 g/dL (ref 3.5–5.2)
BUN: 13 mg/dL (ref 6–23)
CO2: 27 mEq/L (ref 19–32)
Calcium: 9.4 mg/dL (ref 8.4–10.5)
Chloride: 102 mEq/L (ref 96–112)
Creat: 0.79 mg/dL (ref 0.50–1.10)
GFR, Est African American: >89 mL/min
GFR, Est Non African American: 79 mL/min
GLUCOSE: 117 mg/dL — AB (ref 70–99)
Potassium: 4.5 mEq/L (ref 3.5–5.3)
Sodium: 138 mEq/L (ref 135–145)
Total Bilirubin: 0.3 mg/dL (ref 0.2–1.2)
Total Protein: 6.9 g/dL (ref 6.0–8.3)

## 2014-02-23 LAB — LIPID PANEL
CHOL/HDL RATIO: 3.5 ratio
CHOLESTEROL: 142 mg/dL (ref 0–200)
HDL: 41 mg/dL (ref >39)
LDL Cholesterol: 79 mg/dL (ref 0–99)
TRIGLYCERIDES: 111 mg/dL (ref <150)
VLDL: 22 mg/dL (ref 0–40)

## 2014-02-23 LAB — CBC WITH DIFFERENTIAL/PLATELET
BASOS ABS: 0.1 10*3/uL (ref 0.0–0.1)
BASOS PCT: 1 % (ref 0–1)
EOS ABS: 0.4 10*3/uL (ref 0.0–0.7)
Eosinophils Relative: 4 % (ref 0–5)
HCT: 42.5 % (ref 36.0–46.0)
HEMOGLOBIN: 14.9 g/dL (ref 12.0–15.0)
Lymphocytes Relative: 48 % — ABNORMAL HIGH (ref 12–46)
Lymphs Abs: 4.6 10*3/uL — ABNORMAL HIGH (ref 0.7–4.0)
MCH: 31.6 pg (ref 26.0–34.0)
MCHC: 35.1 g/dL (ref 30.0–36.0)
MCV: 90.2 fL (ref 78.0–100.0)
MONOS PCT: 8 % (ref 3–12)
Monocytes Absolute: 0.8 10*3/uL (ref 0.1–1.0)
NEUTROS ABS: 3.7 10*3/uL (ref 1.7–7.7)
Neutrophils Relative %: 39 % — ABNORMAL LOW (ref 43–77)
Platelets: 367 10*3/uL (ref 150–400)
RBC: 4.71 MIL/uL (ref 3.87–5.11)
RDW: 13.7 % (ref 11.5–15.5)
WBC: 9.6 10*3/uL (ref 4.0–10.5)

## 2014-02-23 LAB — HEMOGLOBIN A1C
Hgb A1c MFr Bld: 6.3 % — ABNORMAL HIGH (ref <5.7)
Mean Plasma Glucose: 134 mg/dL — ABNORMAL HIGH (ref <117)

## 2014-02-24 ENCOUNTER — Ambulatory Visit (HOSPITAL_COMMUNITY)
Admission: RE | Admit: 2014-02-24 | Discharge: 2014-02-24 | Disposition: A | Discharge status: Home / Self Care | Payer: Medicare Other | Source: Ambulatory Visit | Attending: Family Medicine | Admitting: Family Medicine

## 2014-02-24 DIAGNOSIS — Z1231 Encounter for screening mammogram for malignant neoplasm of breast: Secondary | ICD-10-CM | POA: Diagnosis not present

## 2014-02-24 DIAGNOSIS — E119 Type 2 diabetes mellitus without complications: Secondary | ICD-10-CM | POA: Diagnosis not present

## 2014-02-28 ENCOUNTER — Ambulatory Visit (INDEPENDENT_AMBULATORY_CARE_PROVIDER_SITE_OTHER): Payer: Medicare Other | Admitting: Family Medicine

## 2014-02-28 ENCOUNTER — Encounter: Payer: Self-pay | Admitting: Family Medicine

## 2014-02-28 VITALS — BP 126/60 | HR 80 | Temp 97.0°F | Resp 16 | Ht 64.0 in | Wt 172.0 lb

## 2014-02-28 DIAGNOSIS — E119 Type 2 diabetes mellitus without complications: Secondary | ICD-10-CM | POA: Diagnosis not present

## 2014-02-28 DIAGNOSIS — E538 Deficiency of other specified B group vitamins: Secondary | ICD-10-CM | POA: Diagnosis not present

## 2014-02-28 DIAGNOSIS — I10 Essential (primary) hypertension: Secondary | ICD-10-CM | POA: Diagnosis not present

## 2014-02-28 DIAGNOSIS — E785 Hyperlipidemia, unspecified: Secondary | ICD-10-CM

## 2014-02-28 MED ORDER — CYANOCOBALAMIN 1000 MCG/ML IJ SOLN
1000.0000 ug | Freq: Once | INTRAMUSCULAR | Status: AC
  Administered 2014-02-28: 1000 ug via INTRAMUSCULAR

## 2014-02-28 NOTE — Progress Notes (Signed)
Subjective:    Patient ID: [name] [name], female    DOB: February 14, 1951, 63 y.o.   MRN: [id number]  HPI Patient has a history of hypertension, coronary artery disease, type 2 diabetes mellitus, and hyperlipidemia. She is here today for followup of her multiple medical problems. She does have a bee sting on her left ankle that is still erythematous and tender after 3 weeks. There is a 1 cm erythematous papule on her left lateral malleolus. There is no evidence of cellulitis or infection. She denies any chest pain shortness of breath or dyspnea on exertion. Unfortunately she continues to smoke. She denies any myalgia right upper quadrant pain. She denies any polyuria, polydipsia, blurred vision. She is consistent taking her aspirin. She is due for a diabetic eye exam. Her most recent lab work is listed below: Appointment on 02/23/2014  Component Date Value Ref Range Status  . WBC 02/23/2014 9.6  4.0 - 10.5 K/uL Final  . RBC 02/23/2014 4.71  3.87 - 5.11 MIL/uL Final  . Hemoglobin 02/23/2014 14.9  12.0 - 15.0 g/dL Final  . HCT 02/23/2014 42.5  36.0 - 46.0 % Final  . MCV 02/23/2014 90.2  78.0 - 100.0 fL Final  . MCH 02/23/2014 31.6  26.0 - 34.0 pg Final  . MCHC 02/23/2014 35.1  30.0 - 36.0 g/dL Final  . RDW 02/23/2014 13.7  11.5 - 15.5 % Final  . Platelets 02/23/2014 367  150 - 400 K/uL Final  . Neutrophils Relative % 02/23/2014 39* 43 - 77 % Final  . Neutro Abs 02/23/2014 3.7  1.7 - 7.7 K/uL Final  . Lymphocytes Relative 02/23/2014 48* 12 - 46 % Final  . Lymphs Abs 02/23/2014 4.6* 0.7 - 4.0 K/uL Final  . Monocytes Relative 02/23/2014 8  3 - 12 % Final  . Monocytes Absolute 02/23/2014 0.8  0.1 - 1.0 K/uL Final  . Eosinophils Relative 02/23/2014 4  0 - 5 % Final  . Eosinophils Absolute 02/23/2014 0.4  0.0 - 0.7 K/uL Final  . Basophils Relative 02/23/2014 1  0 - 1 % Final  . Basophils Absolute 02/23/2014 0.1  0.0 - 0.1 K/uL Final  . Smear Review 02/23/2014 Criteria for review not met   Final  .  Sodium 02/23/2014 138  135 - 145 mEq/L Final  . Potassium 02/23/2014 4.5  3.5 - 5.3 mEq/L Final  . Chloride 02/23/2014 102  96 - 112 mEq/L Final  . CO2 02/23/2014 27  19 - 32 mEq/L Final  . Glucose, Bld 02/23/2014 117* 70 - 99 mg/dL Final  . BUN 02/23/2014 13  6 - 23 mg/dL Final  . Creat 02/23/2014 0.79  0.50 - 1.10 mg/dL Final  . Total Bilirubin 02/23/2014 0.3  0.2 - 1.2 mg/dL Final  . Alkaline Phosphatase 02/23/2014 54  39 - 117 U/L Final  . AST 02/23/2014 23  0 - 37 U/L Final  . ALT 02/23/2014 24  0 - 35 U/L Final  . Total Protein 02/23/2014 6.9  6.0 - 8.3 g/dL Final  . Albumin 02/23/2014 3.8  3.5 - 5.2 g/dL Final  . Calcium 02/23/2014 9.4  8.4 - 10.5 mg/dL Final  . GFR, Est African American 02/23/2014 >89   Final  . GFR, Est Non African American 02/23/2014 79   Final   Comment:                            The estimated GFR is a calculation valid  for adults (>=60 years old)                          that uses the CKD-EPI algorithm to adjust for age and sex. It is                            not to be used for children, pregnant women, hospitalized patients,                             patients on dialysis, or with rapidly changing kidney function.                          According to the NKDEP, eGFR >89 is normal, 60-89 shows mild                          impairment, 30-59 shows moderate impairment, 15-29 shows severe                          impairment and <15 is ESRD.                             Marland Kitchen Cholesterol 02/23/2014 142  0 - 200 mg/dL Final   Comment: ATP III Classification:                                < 200        mg/dL        Desirable                               200 - 239     mg/dL        Borderline High                               >= 240        mg/dL        High                             . Triglycerides 02/23/2014 111  <150 mg/dL Final  . HDL 02/23/2014 41  >39 mg/dL Final  . Total CHOL/HDL Ratio 02/23/2014 3.5   Final  . VLDL 02/23/2014 22  0 - 40 mg/dL Final    . LDL Cholesterol 02/23/2014 79  0 - 99 mg/dL Final   Comment:                            Total Cholesterol/HDL Ratio:CHD Risk                                                 Coronary Heart Disease Risk Table  Men       Women                                   1/2 Average Risk              3.4        3.3                                       Average Risk              5.0        4.4                                    2X Average Risk              9.6        7.1                                    3X Average Risk             23.4       11.0                          Use the calculated Patient Ratio above and the CHD Risk table                           to determine the patient's CHD Risk.                          ATP III Classification (LDL):                                < 100        mg/dL         Optimal                               100 - 129     mg/dL         Near or Above Optimal                               130 - 159     mg/dL         Borderline High                               160 - 189     mg/dL         High                                > 190        mg/dL         Very High                             .  Hemoglobin A1C 02/23/2014 6.3* <5.7 % Final   Comment:                                                                                                 According to the ADA Clinical Practice Recommendations for 2011, when                          HbA1c is used as a screening test:                                                       >=6.5%   Diagnostic of Diabetes Mellitus                                     (if abnormal result is confirmed)                                                     5.7-6.4%   Increased risk of developing Diabetes Mellitus                                                     References:Diagnosis and Classification of Diabetes Mellitus,Diabetes                          AOZH,0865,78(IONGE  1):S62-S69 and Standards of Medical Care in                                  Diabetes - 2011,Diabetes XBMW,4132,44 (Suppl 1):S11-S61.                             . Mean Plasma Glucose 02/23/2014 134* <117 mg/dL Final   Past Medical History  Diagnosis Date  . Duodenitis without mention of hemorrhage   . Diverticulosis of colon (without mention of hemorrhage)   . Hemorrhoids   . Anxiety   . CAD (coronary artery disease)   . Arthritis   . Insomnia   . Tinnitus   . Vertigo   . Diabetes mellitus   . Depression   . GERD (gastroesophageal reflux disease)   . Hypercholesteremia   . Hypertension   . Chronic kidney disease     Renal insufficiency   Past Surgical History  Procedure Laterality Date  . Cholecystectomy    . Angioplasty    . Tubal ligation    . Rotator cuff  repair Right 04/2012   Current Outpatient Prescriptions on File Prior to Visit  Medication Sig Dispense Refill  . aspirin 325 MG tablet Take 325 mg by mouth daily.      Marland Kitchen dexlansoprazole (DEXILANT) 60 MG capsule Take 1 capsule (60 mg total) by mouth daily.  90 capsule  3  . diltiazem (CARDIZEM) 120 MG tablet Take 1 tablet (120 mg total) by mouth daily.  90 tablet  3  . fexofenadine (ALLEGRA) 180 MG tablet Take 1 tablet (180 mg total) by mouth daily.  90 tablet  3  . glucose blood (FREESTYLE LITE) test strip Pt checks BS bid - also needs lancets disp: 1 box with prn refills  100 each  11  . HYDROcodone-acetaminophen (NORCO) 10-325 MG per tablet Take 1 tablet by mouth every 6 (six) hours as needed.      Marland Kitchen losartan (COZAAR) 50 MG tablet Take 1 tablet (50 mg total) by mouth daily.  90 tablet  3  . metFORMIN (GLUCOPHAGE) 1000 MG tablet Take 1 tablet (1,000 mg total) by mouth 2 (two) times daily with a meal.  180 tablet  3  . mometasone (NASONEX) 50 MCG/ACT nasal spray Place 2 sprays into the nose daily. Please dispense 3 bottles for a 3 month supply  51 g  3  . oxymorphone (OPANA ER) 20 MG 12 hr tablet Take 20 mg by mouth  every 12 (twelve) hours.      . phentermine (ADIPEX-P) 37.5 MG tablet TAKE 1 TABLET EVERY MORNING  30 tablet  2  . rOPINIRole (REQUIP) 2 MG tablet Take 1 tablet (2 mg total) by mouth at bedtime.  90 tablet  3   No current facility-administered medications on file prior to visit.   Allergies  Allergen Reactions  . Guaifenesin & Derivatives     Severe Reaction  . Benazepril Other (See Comments)    cough  . Latex     REACTION: powder on gloves  . Morphine     REACTION: nausea  . Zyrtec [Cetirizine] Other (See Comments)    swelling   History   Social History  . Marital Status: Married    Spouse Name: N/A    Number of Children: 1  . Years of Education: N/A   Occupational History  . Retired    Social History Main Topics  . Smoking status: Former Smoker    Quit date: 09/11/2008  . Smokeless tobacco: Never Used  . Alcohol Use: No  . Drug Use: No  . Sexual Activity: Not on file   Other Topics Concern  . Not on file   Social History Narrative  . No narrative on file   Family History  Problem Relation Age of Onset  . Diabetes Mother   . Hemophilia Mother   . Diabetes Brother   . Breast cancer Sister   . Heart disease Father   . Colon polyps Father   . Colon polyps Sister       Review of Systems  All other systems reviewed and are negative.      Objective:   Physical Exam  Vitals reviewed. Constitutional: She appears well-developed and well-nourished.  Eyes: Conjunctivae and EOM are normal. Pupils are equal, round, and reactive to light. No scleral icterus.  Neck: Neck supple. No JVD present. No thyromegaly present.  Cardiovascular: Normal rate, regular rhythm, normal heart sounds and intact distal pulses.  Exam reveals no gallop and no friction rub.   No murmur heard. Pulmonary/Chest: Effort normal and  breath sounds normal. No respiratory distress. She has no wheezes. She has no rales. She exhibits no tenderness.  Abdominal: Soft. Bowel sounds are normal.  She exhibits no distension and no mass. There is no tenderness. There is no rebound and no guarding.  Musculoskeletal: She exhibits no edema and no tenderness.  Lymphadenopathy:    She has no cervical adenopathy.          Assessment & Plan:  1. Type II or unspecified type diabetes mellitus without mention of complication, not stated as uncontrolled Hemoglobin A1c is excellent. Sugars are well controlled. Diabetic eye exam is recommended. Blood pressure is excellent. Diabetic foot exam is normal. Followup in 6 months. At that time I would check a urine microalbumin  2. Essential hypertension Blood pressure is excellent. I made no changes in her medication at this time.  3. HLD (hyperlipidemia) LDL cholesterol is 79. The LDL cholesterol is less than 70. I have recommended diet exercise and lifestyle changes to try to achieve an additional 9 point-of-reduction. I have also recommended smoking cessation.

## 2014-02-28 NOTE — Addendum Note (Signed)
Addended by: Shary Decamp B on: 02/28/2014 11:34 AM   Modules accepted: Orders

## 2014-03-13 DIAGNOSIS — L259 Unspecified contact dermatitis, unspecified cause: Secondary | ICD-10-CM | POA: Diagnosis not present

## 2014-03-13 DIAGNOSIS — L980 Pyogenic granuloma: Secondary | ICD-10-CM | POA: Diagnosis not present

## 2014-03-27 DIAGNOSIS — L259 Unspecified contact dermatitis, unspecified cause: Secondary | ICD-10-CM | POA: Diagnosis not present

## 2014-03-27 DIAGNOSIS — L723 Sebaceous cyst: Secondary | ICD-10-CM | POA: Diagnosis not present

## 2014-03-28 DIAGNOSIS — M5430 Sciatica, unspecified side: Secondary | ICD-10-CM | POA: Diagnosis not present

## 2014-03-28 DIAGNOSIS — M533 Sacrococcygeal disorders, not elsewhere classified: Secondary | ICD-10-CM | POA: Diagnosis not present

## 2014-03-28 DIAGNOSIS — G894 Chronic pain syndrome: Secondary | ICD-10-CM | POA: Diagnosis not present

## 2014-03-28 DIAGNOSIS — M545 Low back pain, unspecified: Secondary | ICD-10-CM | POA: Diagnosis not present

## 2014-03-28 DIAGNOSIS — M542 Cervicalgia: Secondary | ICD-10-CM | POA: Diagnosis not present

## 2014-03-28 DIAGNOSIS — Z79899 Other long term (current) drug therapy: Secondary | ICD-10-CM | POA: Diagnosis not present

## 2014-03-28 DIAGNOSIS — M5137 Other intervertebral disc degeneration, lumbosacral region: Secondary | ICD-10-CM | POA: Diagnosis not present

## 2014-04-06 ENCOUNTER — Ambulatory Visit (INDEPENDENT_AMBULATORY_CARE_PROVIDER_SITE_OTHER): Payer: Medicare Other | Admitting: *Deleted

## 2014-04-06 DIAGNOSIS — E539 Vitamin B deficiency, unspecified: Secondary | ICD-10-CM | POA: Diagnosis not present

## 2014-04-06 MED ORDER — CYANOCOBALAMIN 1000 MCG/ML IJ SOLN
1000.0000 ug | Freq: Once | INTRAMUSCULAR | Status: AC
  Administered 2014-04-06: 1000 ug via INTRAMUSCULAR

## 2014-04-27 DIAGNOSIS — M545 Low back pain, unspecified: Secondary | ICD-10-CM | POA: Diagnosis not present

## 2014-04-27 DIAGNOSIS — M533 Sacrococcygeal disorders, not elsewhere classified: Secondary | ICD-10-CM | POA: Diagnosis not present

## 2014-04-27 DIAGNOSIS — Z79899 Other long term (current) drug therapy: Secondary | ICD-10-CM | POA: Diagnosis not present

## 2014-04-27 DIAGNOSIS — M5430 Sciatica, unspecified side: Secondary | ICD-10-CM | POA: Diagnosis not present

## 2014-04-27 DIAGNOSIS — G894 Chronic pain syndrome: Secondary | ICD-10-CM | POA: Diagnosis not present

## 2014-04-27 DIAGNOSIS — M5412 Radiculopathy, cervical region: Secondary | ICD-10-CM | POA: Diagnosis not present

## 2014-04-27 DIAGNOSIS — G540 Brachial plexus disorders: Secondary | ICD-10-CM | POA: Diagnosis not present

## 2014-04-27 DIAGNOSIS — M542 Cervicalgia: Secondary | ICD-10-CM | POA: Diagnosis not present

## 2014-05-18 ENCOUNTER — Ambulatory Visit (INDEPENDENT_AMBULATORY_CARE_PROVIDER_SITE_OTHER): Payer: Medicare Other | Admitting: Family Medicine

## 2014-05-18 ENCOUNTER — Encounter: Payer: Self-pay | Admitting: Family Medicine

## 2014-05-18 VITALS — BP 140/88 | HR 92 | Temp 98.0°F | Resp 20 | Ht 64.0 in | Wt 178.0 lb

## 2014-05-18 DIAGNOSIS — Z23 Encounter for immunization: Secondary | ICD-10-CM | POA: Diagnosis not present

## 2014-05-18 DIAGNOSIS — M47817 Spondylosis without myelopathy or radiculopathy, lumbosacral region: Secondary | ICD-10-CM | POA: Diagnosis not present

## 2014-05-18 DIAGNOSIS — E538 Deficiency of other specified B group vitamins: Secondary | ICD-10-CM

## 2014-05-18 MED ORDER — CYANOCOBALAMIN 1000 MCG/ML IJ SOLN
1000.0000 ug | Freq: Once | INTRAMUSCULAR | Status: AC
  Administered 2014-05-18: 1000 ug via INTRAMUSCULAR

## 2014-05-18 MED ORDER — CYCLOBENZAPRINE HCL 10 MG PO TABS: 10.0000 mg | ORAL_TABLET | Freq: Three times a day (TID) | ORAL | Status: DC | PRN

## 2014-05-18 MED ORDER — DICLOFENAC SODIUM 75 MG PO TBEC: 75.0000 mg | DELAYED_RELEASE_TABLET | Freq: Two times a day (BID) | ORAL | Status: DC

## 2014-05-18 NOTE — Addendum Note (Signed)
Addended by: Shary Decamp B on: 05/18/2014 11:19 AM   Modules accepted: Orders

## 2014-05-18 NOTE — Progress Notes (Signed)
Subjective:    Patient ID: [name] [name], female    DOB: 1951-04-29, 63 y.o.   MRN: [id number]  HPI Patient has a history of chronic low back pain.  A lumbar x-ray in 2012 revealed moderate spondylosis at L5-S1 with facet hypertrophy and endplate spurring. Patient states that 3 days ago the back pain returned severely. It is located around L5-S1. There is no radiation of the pain. The lumbar paraspinal muscles in that area are extremely tight with palpable muscle spasms. Patient denies any sciatica, numbness or tingling in her legs, or symptoms of cauda equina syndrome. She also denies any recent injury. Past Medical History  Diagnosis Date  . Duodenitis without mention of hemorrhage   . Diverticulosis of colon (without mention of hemorrhage)   . Hemorrhoids   . Anxiety   . CAD (coronary artery disease)   . Arthritis   . Insomnia   . Tinnitus   . Vertigo   . Diabetes mellitus   . Depression   . GERD (gastroesophageal reflux disease)   . Hypercholesteremia   . Hypertension   . Chronic kidney disease     Renal insufficiency   Past Surgical History  Procedure Laterality Date  . Cholecystectomy    . Angioplasty    . Tubal ligation    . Rotator cuff repair Right 04/2012   Current Outpatient Prescriptions on File Prior to Visit  Medication Sig Dispense Refill  . aspirin 325 MG tablet Take 325 mg by mouth daily.      Marland Kitchen dexlansoprazole (DEXILANT) 60 MG capsule Take 1 capsule (60 mg total) by mouth daily.  90 capsule  3  . diltiazem (CARDIZEM) 120 MG tablet Take 1 tablet (120 mg total) by mouth daily.  90 tablet  3  . fexofenadine (ALLEGRA) 180 MG tablet Take 1 tablet (180 mg total) by mouth daily.  90 tablet  3  . glucose blood (FREESTYLE LITE) test strip Pt checks BS bid - also needs lancets disp: 1 box with prn refills  100 each  11  . HYDROcodone-acetaminophen (NORCO) 10-325 MG per tablet Take 1 tablet by mouth every 6 (six) hours as needed.      Marland Kitchen losartan (COZAAR) 50 MG tablet  Take 1 tablet (50 mg total) by mouth daily.  90 tablet  3  . metFORMIN (GLUCOPHAGE) 1000 MG tablet Take 1 tablet (1,000 mg total) by mouth 2 (two) times daily with a meal.  180 tablet  3  . mometasone (NASONEX) 50 MCG/ACT nasal spray Place 2 sprays into the nose daily. Please dispense 3 bottles for a 3 month supply  51 g  3  . oxymorphone (OPANA ER) 20 MG 12 hr tablet Take 20 mg by mouth every 12 (twelve) hours.      . phentermine (ADIPEX-P) 37.5 MG tablet TAKE 1 TABLET EVERY MORNING  30 tablet  2  . rOPINIRole (REQUIP) 2 MG tablet Take 1 tablet (2 mg total) by mouth at bedtime.  90 tablet  3   No current facility-administered medications on file prior to visit.   Allergies  Allergen Reactions  . Guaifenesin & Derivatives     Severe Reaction  . Benazepril Other (See Comments)    cough  . Latex     REACTION: powder on gloves  . Morphine     REACTION: nausea  . Zyrtec [Cetirizine] Other (See Comments)    swelling   History   Social History  . Marital Status: Married    Spouse Name:  N/A    Number of Children: 1  . Years of Education: N/A   Occupational History  . Retired    Social History Main Topics  . Smoking status: Current Every Day Smoker    Last Attempt to Quit: 09/11/2008  . Smokeless tobacco: Never Used  . Alcohol Use: No  . Drug Use: No  . Sexual Activity: Not on file   Other Topics Concern  . Not on file   Social History Narrative  . No narrative on file      Review of Systems  All other systems reviewed and are negative.      Objective:   Physical Exam  Vitals reviewed. Constitutional: She is oriented to person, place, and time.  Cardiovascular: Normal rate, regular rhythm and normal heart sounds.   Pulmonary/Chest: Effort normal and breath sounds normal.  Musculoskeletal:       Lumbar back: She exhibits decreased range of motion, tenderness, pain and spasm. She exhibits no bony tenderness.  Neurological: She is alert and oriented to person,  place, and time. She has normal reflexes. She displays normal reflexes. No cranial nerve deficit. She exhibits normal muscle tone. Coordination normal.          Assessment & Plan:  Lumbosacral spondylosis without myelopathy - Plan: cyclobenzaprine (FLEXERIL) 10 MG tablet, diclofenac (VOLTAREN) 75 MG EC tablet   Begin diclofenac 75 mg by mouth twice a day for inflammation and anteflexed [place] milligrams every 8 hours as needed for muscle spasm. I recommended moist heat be applied today a 3 times a day. Recheck 1 week if no better or sooner if worse.

## 2014-05-31 ENCOUNTER — Encounter: Payer: Self-pay | Admitting: *Deleted

## 2014-05-31 DIAGNOSIS — G540 Brachial plexus disorders: Secondary | ICD-10-CM | POA: Diagnosis not present

## 2014-05-31 DIAGNOSIS — M4327 Fusion of spine, lumbosacral region: Secondary | ICD-10-CM | POA: Diagnosis not present

## 2014-05-31 DIAGNOSIS — M5412 Radiculopathy, cervical region: Secondary | ICD-10-CM | POA: Diagnosis not present

## 2014-05-31 DIAGNOSIS — M5430 Sciatica, unspecified side: Secondary | ICD-10-CM | POA: Diagnosis not present

## 2014-05-31 DIAGNOSIS — M545 Low back pain: Secondary | ICD-10-CM | POA: Diagnosis not present

## 2014-05-31 DIAGNOSIS — M542 Cervicalgia: Secondary | ICD-10-CM | POA: Diagnosis not present

## 2014-06-02 ENCOUNTER — Encounter: Payer: Self-pay | Admitting: Family Medicine

## 2014-06-02 ENCOUNTER — Ambulatory Visit
Admission: RE | Admit: 2014-06-02 | Discharge: 2014-06-02 | Disposition: A | Discharge status: Home / Self Care | Payer: Medicare Other | Source: Ambulatory Visit | Attending: Family Medicine | Admitting: Family Medicine

## 2014-06-02 ENCOUNTER — Ambulatory Visit (INDEPENDENT_AMBULATORY_CARE_PROVIDER_SITE_OTHER): Payer: Medicare Other | Admitting: Family Medicine

## 2014-06-02 VITALS — BP 160/74 | HR 100 | Temp 98.1°F | Resp 22 | Ht 64.0 in | Wt 181.0 lb

## 2014-06-02 DIAGNOSIS — R600 Localized edema: Secondary | ICD-10-CM | POA: Diagnosis not present

## 2014-06-02 NOTE — Progress Notes (Signed)
Subjective:    Patient ID: [name] [name], female    DOB: April 29, 1951, 63 y.o.   MRN: [id number]  HPI Patient was on 49 hour bus ride to [place] over the weekend. Ever since, her right leg has been markedly more swelling in her left leg distal to the knee. Today on examination she has +1 edema in the right leg from the knee to the ankle. It is slightly more swollen than her left leg. She also has some tenderness to palpation in her right hamstring. She denies any injury to the leg. She denies any pain in the leg. She denies any knee pain or ankle pain. She is worried about a DVT. Past Medical History  Diagnosis Date  . Duodenitis without mention of hemorrhage   . Diverticulosis of colon (without mention of hemorrhage)   . Hemorrhoids   . Anxiety   . CAD (coronary artery disease)   . Arthritis   . Insomnia   . Tinnitus   . Vertigo   . Diabetes mellitus   . Depression   . GERD (gastroesophageal reflux disease)   . Hypercholesteremia   . Hypertension   . Chronic kidney disease     Renal insufficiency   Past Surgical History  Procedure Laterality Date  . Cholecystectomy    . Angioplasty    . Tubal ligation    . Rotator cuff repair Right 04/2012   Current Outpatient Prescriptions on File Prior to Visit  Medication Sig Dispense Refill  . aspirin 325 MG tablet Take 325 mg by mouth daily.      . cyclobenzaprine (FLEXERIL) 10 MG tablet Take 1 tablet (10 mg total) by mouth 3 (three) times daily as needed for muscle spasms.  30 tablet  0  . dexlansoprazole (DEXILANT) 60 MG capsule Take 1 capsule (60 mg total) by mouth daily.  90 capsule  3  . diclofenac (VOLTAREN) 75 MG EC tablet Take 1 tablet (75 mg total) by mouth 2 (two) times daily.  30 tablet  0  . diltiazem (CARDIZEM) 120 MG tablet Take 1 tablet (120 mg total) by mouth daily.  90 tablet  3  . fexofenadine (ALLEGRA) 180 MG tablet Take 1 tablet (180 mg total) by mouth daily.  90 tablet  3  . glucose blood (FREESTYLE LITE) test  strip Pt checks BS bid - also needs lancets disp: 1 box with prn refills  100 each  11  . HYDROcodone-acetaminophen (NORCO) 10-325 MG per tablet Take 1 tablet by mouth every 6 (six) hours as needed.      Marland Kitchen losartan (COZAAR) 50 MG tablet Take 1 tablet (50 mg total) by mouth daily.  90 tablet  3  . metFORMIN (GLUCOPHAGE) 1000 MG tablet Take 1 tablet (1,000 mg total) by mouth 2 (two) times daily with a meal.  180 tablet  3  . mometasone (NASONEX) 50 MCG/ACT nasal spray Place 2 sprays into the nose daily. Please dispense 3 bottles for a 3 month supply  51 g  3  . oxymorphone (OPANA ER) 20 MG 12 hr tablet Take 20 mg by mouth every 12 (twelve) hours.      Marland Kitchen rOPINIRole (REQUIP) 2 MG tablet Take 1 tablet (2 mg total) by mouth at bedtime.  90 tablet  3   No current facility-administered medications on file prior to visit.   Allergies  Allergen Reactions  . Guaifenesin & Derivatives     Severe Reaction  . Benazepril Other (See Comments)    cough  .  Latex     REACTION: powder on gloves  . Morphine     REACTION: nausea  . Zyrtec [Cetirizine] Other (See Comments)    swelling   History   Social History  . Marital Status: Married    Spouse Name: N/A    Number of Children: 1  . Years of Education: N/A   Occupational History  . Retired    Social History Main Topics  . Smoking status: Current Every Day Smoker    Last Attempt to Quit: 09/11/2008  . Smokeless tobacco: Never Used  . Alcohol Use: No  . Drug Use: No  . Sexual Activity: Not on file   Other Topics Concern  . Not on file   Social History Narrative  . No narrative on file      Review of Systems  All other systems reviewed and are negative.      Objective:   Physical Exam  Vitals reviewed. Cardiovascular: Normal rate, regular rhythm, normal heart sounds and intact distal pulses.   No murmur heard. Pulmonary/Chest: Effort normal and breath sounds normal. No respiratory distress. She has no wheezes. She has no rales.    Abdominal: Soft. Bowel sounds are normal.  Musculoskeletal: She exhibits edema.   patient does have asymmetric edema in her right leg which is slightly worse than her left leg.        Assessment & Plan:  Leg edema, right - Plan: US Venous Img Lower Unilateral Right  Patient does have swelling which is asymmetric in her right leg. I cannot completely exclude a DVT. Also the patient for a venous ultrasound to rule out a DVT as soon as possible. If there is a DVT, start the patient on xarelto 15 bid and have her follow up here Monday to discuss further.

## 2014-06-07 NOTE — Telephone Encounter (Signed)
This encounter was created in error - please disregard.

## 2014-06-09 ENCOUNTER — Other Ambulatory Visit: Payer: Self-pay | Admitting: Family Medicine

## 2014-06-09 NOTE — Telephone Encounter (Signed)
ok 

## 2014-06-09 NOTE — Telephone Encounter (Signed)
?   OK to Refill  

## 2014-06-29 DIAGNOSIS — G8929 Other chronic pain: Secondary | ICD-10-CM | POA: Diagnosis not present

## 2014-06-29 DIAGNOSIS — M4327 Fusion of spine, lumbosacral region: Secondary | ICD-10-CM | POA: Diagnosis not present

## 2014-06-29 DIAGNOSIS — M25512 Pain in left shoulder: Secondary | ICD-10-CM | POA: Diagnosis not present

## 2014-06-29 DIAGNOSIS — M545 Low back pain: Secondary | ICD-10-CM | POA: Diagnosis not present

## 2014-06-29 DIAGNOSIS — M542 Cervicalgia: Secondary | ICD-10-CM | POA: Diagnosis not present

## 2014-06-29 DIAGNOSIS — M25511 Pain in right shoulder: Secondary | ICD-10-CM | POA: Diagnosis not present

## 2014-06-29 DIAGNOSIS — Z79891 Long term (current) use of opiate analgesic: Secondary | ICD-10-CM | POA: Diagnosis not present

## 2014-07-23 ENCOUNTER — Other Ambulatory Visit: Payer: Self-pay | Admitting: Family Medicine

## 2014-08-01 DIAGNOSIS — M62838 Other muscle spasm: Secondary | ICD-10-CM | POA: Diagnosis not present

## 2014-08-01 DIAGNOSIS — M6281 Muscle weakness (generalized): Secondary | ICD-10-CM | POA: Diagnosis not present

## 2014-08-01 DIAGNOSIS — M5411 Radiculopathy, occipito-atlanto-axial region: Secondary | ICD-10-CM | POA: Diagnosis not present

## 2014-08-01 DIAGNOSIS — M25512 Pain in left shoulder: Secondary | ICD-10-CM | POA: Diagnosis not present

## 2014-08-01 DIAGNOSIS — M5412 Radiculopathy, cervical region: Secondary | ICD-10-CM | POA: Diagnosis not present

## 2014-08-01 DIAGNOSIS — M25511 Pain in right shoulder: Secondary | ICD-10-CM | POA: Diagnosis not present

## 2014-08-01 DIAGNOSIS — G8929 Other chronic pain: Secondary | ICD-10-CM | POA: Diagnosis not present

## 2014-08-01 DIAGNOSIS — M545 Low back pain: Secondary | ICD-10-CM | POA: Diagnosis not present

## 2014-08-30 DIAGNOSIS — M25511 Pain in right shoulder: Secondary | ICD-10-CM | POA: Diagnosis not present

## 2014-08-30 DIAGNOSIS — G8929 Other chronic pain: Secondary | ICD-10-CM | POA: Diagnosis not present

## 2014-08-30 DIAGNOSIS — M542 Cervicalgia: Secondary | ICD-10-CM | POA: Diagnosis not present

## 2014-08-30 DIAGNOSIS — M25512 Pain in left shoulder: Secondary | ICD-10-CM | POA: Diagnosis not present

## 2014-08-30 DIAGNOSIS — M545 Low back pain: Secondary | ICD-10-CM | POA: Diagnosis not present

## 2014-10-04 DIAGNOSIS — M25511 Pain in right shoulder: Secondary | ICD-10-CM | POA: Diagnosis not present

## 2014-10-04 DIAGNOSIS — M25512 Pain in left shoulder: Secondary | ICD-10-CM | POA: Diagnosis not present

## 2014-10-04 DIAGNOSIS — M542 Cervicalgia: Secondary | ICD-10-CM | POA: Diagnosis not present

## 2014-10-04 DIAGNOSIS — M545 Low back pain: Secondary | ICD-10-CM | POA: Diagnosis not present

## 2014-11-01 ENCOUNTER — Ambulatory Visit: Payer: Medicare Other | Admitting: Family Medicine

## 2014-11-01 DIAGNOSIS — M25511 Pain in right shoulder: Secondary | ICD-10-CM | POA: Diagnosis not present

## 2014-11-01 DIAGNOSIS — G8929 Other chronic pain: Secondary | ICD-10-CM | POA: Diagnosis not present

## 2014-11-01 DIAGNOSIS — M545 Low back pain: Secondary | ICD-10-CM | POA: Diagnosis not present

## 2014-11-01 DIAGNOSIS — M542 Cervicalgia: Secondary | ICD-10-CM | POA: Diagnosis not present

## 2014-11-01 DIAGNOSIS — M25512 Pain in left shoulder: Secondary | ICD-10-CM | POA: Diagnosis not present

## 2014-11-03 ENCOUNTER — Ambulatory Visit: Payer: Medicare Other | Admitting: Family Medicine

## 2014-11-03 ENCOUNTER — Ambulatory Visit (INDEPENDENT_AMBULATORY_CARE_PROVIDER_SITE_OTHER): Payer: Medicare Other | Admitting: Family Medicine

## 2014-11-03 ENCOUNTER — Encounter: Payer: Self-pay | Admitting: Family Medicine

## 2014-11-03 VITALS — BP 158/78 | HR 92 | Temp 98.3°F | Resp 22 | Ht 64.0 in | Wt 173.0 lb

## 2014-11-03 DIAGNOSIS — I10 Essential (primary) hypertension: Secondary | ICD-10-CM

## 2014-11-03 DIAGNOSIS — R05 Cough: Secondary | ICD-10-CM | POA: Diagnosis not present

## 2014-11-03 DIAGNOSIS — J209 Acute bronchitis, unspecified: Secondary | ICD-10-CM

## 2014-11-03 DIAGNOSIS — Z720 Tobacco use: Secondary | ICD-10-CM

## 2014-11-03 DIAGNOSIS — F17200 Nicotine dependence, unspecified, uncomplicated: Secondary | ICD-10-CM

## 2014-11-03 DIAGNOSIS — R059 Cough, unspecified: Secondary | ICD-10-CM

## 2014-11-03 MED ORDER — AZITHROMYCIN 250 MG PO TABS: ORAL_TABLET | ORAL | Status: DC

## 2014-11-03 MED ORDER — METHYLPREDNISOLONE ACETATE 40 MG/ML IJ SUSP
40.0000 mg | Freq: Once | INTRAMUSCULAR | Status: AC
  Administered 2014-11-03: 40 mg via INTRAMUSCULAR

## 2014-11-03 MED ORDER — ALBUTEROL SULFATE HFA 108 (90 BASE) MCG/ACT IN AERS: 2.0000 | INHALATION_SPRAY | RESPIRATORY_TRACT | Status: DC | PRN

## 2014-11-03 MED ORDER — ALBUTEROL SULFATE (2.5 MG/3ML) 0.083% IN NEBU
2.5000 mg | INHALATION_SOLUTION | Freq: Once | RESPIRATORY_TRACT | Status: AC
  Administered 2014-11-03: 2.5 mg via RESPIRATORY_TRACT

## 2014-11-03 MED ORDER — PREDNISONE 10 MG PO TABS: ORAL_TABLET | ORAL | Status: DC

## 2014-11-03 NOTE — Patient Instructions (Signed)
Take antibiotics, prednisone as prescribed Use inhaler every 4 hours F/U On Monday or Tuesday for recheck, come fasting for bloodwork as well

## 2014-11-03 NOTE — Progress Notes (Signed)
Patient ID: Aldona Bryner, female   DOB: 11/10/1950, 64 y.o.   MRN: [id number]      Subjective:    Patient ID: Pakou Rainbow, female    DOB: 04-Oct-1950, 64 y.o.   MRN: [id number]  Patient presents for Illness  Pt here with cough with production, wheezing , headache, worsening over past week, + smoker no COPD history. Taking all meds for DM, HTN, chronic pain. Has used OTC meds for cough but feels SOB with exertion  subjective fever and chill,s +sick contact with son who has the flu like illness.     Review Of Systems:  GEN- denies fatigue, fever, weight loss,weakness, recent illness HEENT- denies eye drainage, change in vision, nasal discharge, CVS- denies chest pain, palpitations RESP- + SOB, +cough,+ wheeze ABD- denies N/V, change in stools, abd pain GU- denies dysuria, hematuria, dribbling, incontinence MSK- denies joint pain, muscle aches, injury Neuro- denies headache, dizziness, syncope, seizure activity       Objective:    BP 158/78 mmHg  Pulse 92  Temp(Src) 98.3 F (36.8 C) (Oral)  Resp 22  Ht 5\' 4"  (1.626 m)  Wt 173 lb (78.472 kg)  BMI 29.68 kg/m2  SpO2 89% GEN- NAD, alert and oriented x3 HEENT- PERRL, EOMI, non injected sclera, pink conjunctiva, MMM, oropharynx clear, nares clear rhinorrhea Neck- Supple, no LAD CVS- RRR, no murmur RESP-bilat wheeze, rhonchi bilat bases, fair air movement, speaking in full sentences,  ABD-NABS,soft,NT,ND EXT- No edema Pulses- Radial, DP- 2+  S/p neb-improved WOB - oxygen sat 92%, decreased wheeze FLU-NEGATIVE     Assessment & Plan:      Problem List Items Addressed This Visit      Unprioritized   TOBACCO ABUSE   Hypertension    Other Visit Diagnoses    Acute bronchitis, unspecified organism    -  Primary    Concern she may be developing COPD, discussed need for tobacco cessation. Depo Medrol 40mg  IM given, prednisone taper, zpak, albuterol    Relevant Medications    albuterol (PROVENTIL) (2.5 MG/3ML) 0.083%  nebulizer solution 2.5 mg (Completed)    methylPREDNISolone acetate (DEPO-MEDROL) injection 40 mg (Completed)    Cough        Relevant Medications    albuterol (PROVENTIL) (2.5 MG/3ML) 0.083% nebulizer solution 2.5 mg (Completed)    methylPREDNISolone acetate (DEPO-MEDROL) injection 40 mg (Completed)    Other Relevant Orders    Influenza a and b       Note: This dictation was prepared with Dragon dictation along with smaller phrase technology. Any transcriptional errors that result from this process are unintentional.

## 2014-11-03 NOTE — Assessment & Plan Note (Signed)
Blood pressure elevated today however an acute illness. She will follow-up next week will also have fasting labs for her diabetes and blood pressure at that time we'll recheck her blood pressures and see if titration needs to be done

## 2014-11-06 ENCOUNTER — Other Ambulatory Visit: Payer: Medicare Other

## 2014-11-06 DIAGNOSIS — E785 Hyperlipidemia, unspecified: Secondary | ICD-10-CM | POA: Diagnosis not present

## 2014-11-06 DIAGNOSIS — I10 Essential (primary) hypertension: Secondary | ICD-10-CM | POA: Diagnosis not present

## 2014-11-06 DIAGNOSIS — E119 Type 2 diabetes mellitus without complications: Secondary | ICD-10-CM | POA: Diagnosis not present

## 2014-11-06 LAB — COMPLETE METABOLIC PANEL WITH GFR
ALBUMIN: 3.9 g/dL (ref 3.5–5.2)
ALT: 16 U/L (ref 0–35)
AST: 19 U/L (ref 0–37)
Alkaline Phosphatase: 53 U/L (ref 39–117)
BUN: 10 mg/dL (ref 6–23)
CO2: 28 meq/L (ref 19–32)
Calcium: 10.5 mg/dL (ref 8.4–10.5)
Chloride: 95 mEq/L — ABNORMAL LOW (ref 96–112)
Creat: 0.72 mg/dL (ref 0.50–1.10)
GFR, Est Non African American: 88 mL/min
Glucose, Bld: 93 mg/dL (ref 70–99)
POTASSIUM: 5.1 meq/L (ref 3.5–5.3)
SODIUM: 136 meq/L (ref 135–145)
TOTAL PROTEIN: 7.8 g/dL (ref 6.0–8.3)
Total Bilirubin: 0.3 mg/dL (ref 0.2–1.2)

## 2014-11-06 LAB — CBC WITH DIFFERENTIAL/PLATELET
BASOS PCT: 0 % (ref 0–1)
Basophils Absolute: 0 10*3/uL (ref 0.0–0.1)
EOS ABS: 0.1 10*3/uL (ref 0.0–0.7)
Eosinophils Relative: 1 % (ref 0–5)
HEMATOCRIT: 43.6 % (ref 36.0–46.0)
Hemoglobin: 14.7 g/dL (ref 12.0–15.0)
Lymphocytes Relative: 39 % (ref 12–46)
Lymphs Abs: 4.8 10*3/uL — ABNORMAL HIGH (ref 0.7–4.0)
MCH: 30.8 pg (ref 26.0–34.0)
MCHC: 33.7 g/dL (ref 30.0–36.0)
MCV: 91.4 fL (ref 78.0–100.0)
MONO ABS: 1.1 10*3/uL — AB (ref 0.1–1.0)
MONOS PCT: 9 % (ref 3–12)
MPV: 9.1 fL (ref 8.6–12.4)
Neutro Abs: 6.2 10*3/uL (ref 1.7–7.7)
Neutrophils Relative %: 51 % (ref 43–77)
Platelets: 406 10*3/uL — ABNORMAL HIGH (ref 150–400)
RBC: 4.77 MIL/uL (ref 3.87–5.11)
RDW: 14.5 % (ref 11.5–15.5)
WBC: 12.2 10*3/uL — ABNORMAL HIGH (ref 4.0–10.5)

## 2014-11-06 LAB — LIPID PANEL
CHOLESTEROL: 154 mg/dL (ref 0–200)
HDL: <5 mg/dL — ABNORMAL LOW (ref >=46)
Triglycerides: 120 mg/dL (ref <150)
VLDL: 24 mg/dL (ref 0–40)

## 2014-11-06 LAB — HEMOGLOBIN A1C
HEMOGLOBIN A1C: 6.4 % — AB (ref <5.7)
Mean Plasma Glucose: 137 mg/dL — ABNORMAL HIGH (ref <117)

## 2014-11-10 ENCOUNTER — Ambulatory Visit (INDEPENDENT_AMBULATORY_CARE_PROVIDER_SITE_OTHER): Payer: Medicare Other | Admitting: Family Medicine

## 2014-11-10 ENCOUNTER — Encounter: Payer: Self-pay | Admitting: Family Medicine

## 2014-11-10 VITALS — BP 140/68 | HR 88 | Temp 98.2°F | Resp 20 | Ht 64.0 in | Wt 174.0 lb

## 2014-11-10 DIAGNOSIS — E119 Type 2 diabetes mellitus without complications: Secondary | ICD-10-CM

## 2014-11-10 DIAGNOSIS — I10 Essential (primary) hypertension: Secondary | ICD-10-CM

## 2014-11-10 NOTE — Progress Notes (Signed)
Subjective:    Patient ID: [name] [name], female    DOB: [birth year], 64 y.o.   MRN: [id number]  HPI Patient is here today for recheck of her chronic medical problems. She has a history of hypertension, hyperlipidemia, coronary artery disease, and type 2 diabetes mellitus.  She is not currently on any statin medication. She denies any chest pain shortness of breath or dyspnea on exertion. She denies any myalgias or right upper quadrant pain. She denies any polyuria, polydipsia, or blurred vision. Most recent lab work as listed below. I believe her cholesterol test was erroneous given the values that returned.   Appointment on 11/06/2014  Component Date Value Ref Range Status  . WBC 11/06/2014 12.2* 4.0 - 10.5 K/uL Final  . RBC 11/06/2014 4.77  3.87 - 5.11 MIL/uL Final  . Hemoglobin 11/06/2014 14.7  12.0 - 15.0 g/dL Final  . HCT 11/06/2014 43.6  36.0 - 46.0 % Final  . MCV 11/06/2014 91.4  78.0 - 100.0 fL Final  . MCH 11/06/2014 30.8  26.0 - 34.0 pg Final  . MCHC 11/06/2014 33.7  30.0 - 36.0 g/dL Final  . RDW 11/06/2014 14.5  11.5 - 15.5 % Final  . Platelets 11/06/2014 406* 150 - 400 K/uL Final  . MPV 11/06/2014 9.1  8.6 - 12.4 fL Final  . Neutrophils Relative % 11/06/2014 51  43 - 77 % Final  . Neutro Abs 11/06/2014 6.2  1.7 - 7.7 K/uL Final  . Lymphocytes Relative 11/06/2014 39  12 - 46 % Final  . Lymphs Abs 11/06/2014 4.8* 0.7 - 4.0 K/uL Final  . Monocytes Relative 11/06/2014 9  3 - 12 % Final  . Monocytes Absolute 11/06/2014 1.1* 0.1 - 1.0 K/uL Final  . Eosinophils Relative 11/06/2014 1  0 - 5 % Final  . Eosinophils Absolute 11/06/2014 0.1  0.0 - 0.7 K/uL Final  . Basophils Relative 11/06/2014 0  0 - 1 % Final  . Basophils Absolute 11/06/2014 0.0  0.0 - 0.1 K/uL Final  . Smear Review 11/06/2014 Criteria for review not met   Final  . Sodium 11/06/2014 136  135 - 145 mEq/L Final  . Potassium 11/06/2014 5.1  3.5 - 5.3 mEq/L Final  . Chloride 11/06/2014 95* 96 - 112 mEq/L Final  . CO2  11/06/2014 28  19 - 32 mEq/L Final  . Glucose, Bld 11/06/2014 93  70 - 99 mg/dL Final  . BUN 11/06/2014 10  6 - 23 mg/dL Final  . Creat 11/06/2014 0.72  0.50 - 1.10 mg/dL Final  . Total Bilirubin 11/06/2014 0.3  0.2 - 1.2 mg/dL Final  . Alkaline Phosphatase 11/06/2014 53  39 - 117 U/L Final  . AST 11/06/2014 19  0 - 37 U/L Final  . ALT 11/06/2014 16  0 - 35 U/L Final  . Total Protein 11/06/2014 7.8  6.0 - 8.3 g/dL Final  . Albumin 11/06/2014 3.9  3.5 - 5.2 g/dL Final  . Calcium 11/06/2014 10.5  8.4 - 10.5 mg/dL Final  . GFR, Est African American 11/06/2014 >89   Final  . GFR, Est Non African American 11/06/2014 88   Final   Comment:   The estimated GFR is a calculation valid for adults (>=45 years old) that uses the CKD-EPI algorithm to adjust for age and sex. It is   not to be used for children, pregnant women, hospitalized patients,    patients on dialysis, or with rapidly changing kidney function. According to the NKDEP, eGFR >89 is  normal, 60-89 shows mild impairment, 30-59 shows moderate impairment, 15-29 shows severe impairment and <15 is ESRD.     Marland Kitchen Cholesterol 11/06/2014 154  0 - 200 mg/dL Final   Comment: Result repeated and verified. ATP III Classification:       < 200        mg/dL        Desirable      200 - 239     mg/dL        Borderline High      >= 240        mg/dL        High     . Triglycerides 11/06/2014 120  <150 mg/dL Final  . HDL 11/06/2014 <5* >=46 mg/dL Final   Comment: Result repeated and verified. ** Please note change in reference range(s). **   . Total CHOL/HDL Ratio 11/06/2014 SEE NOTE   Final   NOT CALCULATED  . VLDL 11/06/2014 24  0 - 40 mg/dL Final  . LDL Cholesterol 11/06/2014 NOT CALC  0 - 99 mg/dL Final   Comment:   Total Cholesterol/HDL Ratio:CHD Risk                        Coronary Heart Disease Risk Table                                        Men       Women          1/2 Average Risk              3.4        3.3              Average  Risk              5.0        4.4           2X Average Risk              9.6        7.1           3X Average Risk             23.4       11.0 Use the calculated Patient Ratio above and the CHD Risk table  to determine the patient's CHD Risk. ATP III Classification (LDL):       < 100        mg/dL         Optimal      100 - 129     mg/dL         Near or Above Optimal      130 - 159     mg/dL         Borderline High      160 - 189     mg/dL         High       > 190        mg/dL         Very High     . Hgb A1c MFr Bld 11/06/2014 6.4* <5.7 % Final   Comment:  According to the ADA Clinical Practice Recommendations for 2011, when HbA1c is used as a screening test:     >=6.5%   Diagnostic of Diabetes Mellitus            (if abnormal result is confirmed)   5.7-6.4%   Increased risk of developing Diabetes Mellitus   References:Diagnosis and Classification of Diabetes Mellitus,Diabetes KZSW,1093,23(FTDDU 1):S62-S69 and Standards of Medical Care in         Diabetes - 2011,Diabetes KGUR,4270,62 (Suppl 1):S11-S61.     . Mean Plasma Glucose 11/06/2014 137* <117 mg/dL Final   Past Medical History  Diagnosis Date  . Duodenitis without mention of hemorrhage   . Diverticulosis of colon (without mention of hemorrhage)   . Hemorrhoids   . Anxiety   . CAD (coronary artery disease)   . Arthritis   . Insomnia   . Tinnitus   . Vertigo   . Diabetes mellitus   . Depression   . GERD (gastroesophageal reflux disease)   . Hypercholesteremia   . Hypertension   . Chronic kidney disease     Renal insufficiency   Past Surgical History  Procedure Laterality Date  . Cholecystectomy    . Angioplasty    . Tubal ligation    . Rotator cuff repair Right 04/2012   Current Outpatient Prescriptions on File Prior to Visit  Medication Sig Dispense Refill  . albuterol (PROVENTIL HFA;VENTOLIN HFA) 108 (90 BASE) MCG/ACT inhaler Inhale 2  puffs into the lungs every 4 (four) hours as needed for wheezing or shortness of breath. 1 Inhaler 2  . aspirin 325 MG tablet Take 325 mg by mouth daily.    Marland Kitchen dexlansoprazole (DEXILANT) 60 MG capsule Take 1 capsule (60 mg total) by mouth daily. 90 capsule 3  . diltiazem (CARDIZEM) 120 MG tablet Take 1 tablet (120 mg total) by mouth daily. 90 tablet 3  . fexofenadine (ALLEGRA) 180 MG tablet Take 1 tablet (180 mg total) by mouth daily. 90 tablet 3  . glucose blood (FREESTYLE LITE) test strip Pt checks BS bid - also needs lancets disp: 1 box with prn refills 100 each 11  . HYDROcodone-acetaminophen (NORCO) 10-325 MG per tablet Take 1 tablet by mouth every 6 (six) hours as needed.    Marland Kitchen losartan (COZAAR) 50 MG tablet Take 1 tablet (50 mg total) by mouth daily. 90 tablet 3  . metFORMIN (GLUCOPHAGE) 1000 MG tablet Take 1 tablet (1,000 mg total) by mouth 2 (two) times daily with a meal. 180 tablet 3  . mometasone (NASONEX) 50 MCG/ACT nasal spray Place 2 sprays into the nose daily. Please dispense 3 bottles for a 3 month supply 51 g 3  . oxymorphone (OPANA ER) 20 MG 12 hr tablet Take 20 mg by mouth every 12 (twelve) hours.    Marland Kitchen rOPINIRole (REQUIP) 2 MG tablet Take 1 tablet (2 mg total) by mouth at bedtime. 90 tablet 3   No current facility-administered medications on file prior to visit.   Allergies  Allergen Reactions  . Guaifenesin & Derivatives     Severe Reaction  . Benazepril Other (See Comments)    cough  . Latex     REACTION: powder on gloves  . Morphine     REACTION: nausea  . Zyrtec [Cetirizine] Other (See Comments)    swelling   History   Social History  . Marital Status: Married    Spouse Name: N/A  . Number of Children: 1  . Years of Education: N/A  Occupational History  . Retired    Social History Main Topics  . Smoking status: Current Every Day Smoker    Last Attempt to Quit: 09/11/2008  . Smokeless tobacco: Never Used  . Alcohol Use: No  . Drug Use: No  . Sexual  Activity: Not on file   Other Topics Concern  . Not on file   Social History Narrative      Review of Systems  All other systems reviewed and are negative.      Objective:   Physical Exam  Constitutional: She appears well-developed and well-nourished.  Cardiovascular: Normal rate, regular rhythm, normal heart sounds and intact distal pulses.  Exam reveals no gallop and no friction rub.   No murmur heard. Pulmonary/Chest: Effort normal and breath sounds normal. No respiratory distress. She has no wheezes. She has no rales.  Abdominal: Soft. Bowel sounds are normal. She exhibits no distension. There is no tenderness. There is no rebound and no guarding.  Musculoskeletal: She exhibits no edema.  Vitals reviewed.         Assessment & Plan:  Essential hypertension  Diabetes mellitus type II, controlled  Blood pressure is excellent. I will make no changes in her blood pressure medication. Her diabetes tests is well controlled. I will make no changes in her metformin. I have asked the patient to return fasting so that we can repeat her lipid panel. I believe the lipid panel that she recently had drawn is incorrect. I will await the repeat lipid panel before making judgment on the control of her cholesterol. Given her history of coronary artery disease, I would recommend a statin medication unless LDL is well below 70.

## 2014-11-14 ENCOUNTER — Telehealth: Payer: Self-pay | Admitting: Family Medicine

## 2014-11-14 NOTE — Telephone Encounter (Signed)
Patient calling for refill on allegra to be called into [facility]  And also needs all maintance drugs called into  [facility] pharmacy [phone]  Best call back [phone]

## 2014-11-15 MED ORDER — LOSARTAN POTASSIUM 50 MG PO TABS: 50.0000 mg | ORAL_TABLET | Freq: Every day | ORAL | Status: DC

## 2014-11-15 MED ORDER — METFORMIN HCL 1000 MG PO TABS: 1000.0000 mg | ORAL_TABLET | Freq: Two times a day (BID) | ORAL | Status: DC

## 2014-11-15 MED ORDER — ROPINIROLE HCL 2 MG PO TABS: 2.0000 mg | ORAL_TABLET | Freq: Every day | ORAL | Status: DC

## 2014-11-15 MED ORDER — DEXLANSOPRAZOLE 60 MG PO CPDR: 60.0000 mg | DELAYED_RELEASE_CAPSULE | Freq: Every day | ORAL | Status: DC

## 2014-11-15 MED ORDER — FEXOFENADINE HCL 180 MG PO TABS: 180.0000 mg | ORAL_TABLET | Freq: Every day | ORAL | Status: DC

## 2014-11-15 NOTE — Telephone Encounter (Signed)
Med sent to pharm as requested

## 2014-11-16 LAB — INFLUENZA A AND B
Inflenza A Ag: NEGATIVE
Influenza B Ag: NEGATIVE

## 2014-11-21 ENCOUNTER — Other Ambulatory Visit: Payer: Medicare Other

## 2014-11-21 DIAGNOSIS — E785 Hyperlipidemia, unspecified: Secondary | ICD-10-CM | POA: Diagnosis not present

## 2014-11-21 DIAGNOSIS — I10 Essential (primary) hypertension: Secondary | ICD-10-CM

## 2014-11-21 DIAGNOSIS — E119 Type 2 diabetes mellitus without complications: Secondary | ICD-10-CM | POA: Diagnosis not present

## 2014-11-21 LAB — LIPID PANEL
CHOL/HDL RATIO: 4.6 ratio
Cholesterol: 172 mg/dL (ref 0–200)
HDL: 37 mg/dL — AB (ref >=46)
LDL CALC: 106 mg/dL — AB (ref 0–99)
Triglycerides: 144 mg/dL (ref <150)
VLDL: 29 mg/dL (ref 0–40)

## 2014-11-24 ENCOUNTER — Other Ambulatory Visit: Payer: Self-pay | Admitting: *Deleted

## 2014-11-24 MED ORDER — ATORVASTATIN CALCIUM 20 MG PO TABS: 20.0000 mg | ORAL_TABLET | Freq: Every day | ORAL | Status: DC

## 2014-11-24 MED ORDER — FEXOFENADINE HCL 180 MG PO TABS: 180.0000 mg | ORAL_TABLET | Freq: Every day | ORAL | Status: DC

## 2014-11-30 DIAGNOSIS — G8929 Other chronic pain: Secondary | ICD-10-CM | POA: Diagnosis not present

## 2014-11-30 DIAGNOSIS — M545 Low back pain: Secondary | ICD-10-CM | POA: Diagnosis not present

## 2014-12-04 ENCOUNTER — Encounter: Payer: Self-pay | Admitting: Gastroenterology

## 2014-12-29 DIAGNOSIS — Z79891 Long term (current) use of opiate analgesic: Secondary | ICD-10-CM | POA: Diagnosis not present

## 2014-12-29 DIAGNOSIS — M25512 Pain in left shoulder: Secondary | ICD-10-CM | POA: Diagnosis not present

## 2014-12-29 DIAGNOSIS — M25511 Pain in right shoulder: Secondary | ICD-10-CM | POA: Diagnosis not present

## 2014-12-29 DIAGNOSIS — G8929 Other chronic pain: Secondary | ICD-10-CM | POA: Diagnosis not present

## 2014-12-29 DIAGNOSIS — M542 Cervicalgia: Secondary | ICD-10-CM | POA: Diagnosis not present

## 2014-12-29 DIAGNOSIS — M545 Low back pain: Secondary | ICD-10-CM | POA: Diagnosis not present

## 2015-01-29 ENCOUNTER — Other Ambulatory Visit: Payer: Self-pay | Admitting: Family Medicine

## 2015-01-29 DIAGNOSIS — Z1231 Encounter for screening mammogram for malignant neoplasm of breast: Secondary | ICD-10-CM

## 2015-01-29 DIAGNOSIS — R202 Paresthesia of skin: Secondary | ICD-10-CM | POA: Diagnosis not present

## 2015-01-29 DIAGNOSIS — G541 Lumbosacral plexus disorders: Secondary | ICD-10-CM | POA: Diagnosis not present

## 2015-01-29 DIAGNOSIS — M79621 Pain in right upper arm: Secondary | ICD-10-CM | POA: Diagnosis not present

## 2015-01-29 DIAGNOSIS — M25511 Pain in right shoulder: Secondary | ICD-10-CM | POA: Diagnosis not present

## 2015-01-29 DIAGNOSIS — M5413 Radiculopathy, cervicothoracic region: Secondary | ICD-10-CM | POA: Diagnosis not present

## 2015-01-29 DIAGNOSIS — M545 Low back pain: Secondary | ICD-10-CM | POA: Diagnosis not present

## 2015-01-29 DIAGNOSIS — M79622 Pain in left upper arm: Secondary | ICD-10-CM | POA: Diagnosis not present

## 2015-01-29 DIAGNOSIS — Z79891 Long term (current) use of opiate analgesic: Secondary | ICD-10-CM | POA: Diagnosis not present

## 2015-01-30 ENCOUNTER — Other Ambulatory Visit: Payer: Self-pay | Admitting: Family Medicine

## 2015-02-15 ENCOUNTER — Ambulatory Visit (INDEPENDENT_AMBULATORY_CARE_PROVIDER_SITE_OTHER): Payer: Medicare Other | Admitting: Family Medicine

## 2015-02-15 ENCOUNTER — Encounter: Payer: Self-pay | Admitting: Family Medicine

## 2015-02-15 VITALS — BP 140/68 | HR 96 | Temp 98.0°F | Resp 18 | Ht 64.0 in | Wt 175.0 lb

## 2015-02-15 DIAGNOSIS — Z713 Dietary counseling and surveillance: Secondary | ICD-10-CM

## 2015-02-15 DIAGNOSIS — G2581 Restless legs syndrome: Secondary | ICD-10-CM

## 2015-02-15 MED ORDER — DILTIAZEM HCL 120 MG PO TABS: 120.0000 mg | ORAL_TABLET | Freq: Every day | ORAL | Status: DC

## 2015-02-15 MED ORDER — PHENTERMINE HCL 37.5 MG PO CAPS: 37.5000 mg | ORAL_CAPSULE | ORAL | Status: DC

## 2015-02-15 NOTE — Progress Notes (Signed)
Subjective:    Patient ID: [name] [name], female    DOB: 1950/09/02, 64 y.o.   MRN: [id number]  HPI  Patient is trying to quit smoking.  Unfortunately, this causes her to have a near uncontrollable urge to eat.  She would like to use adipex to help suppress her appetite for the next 30 days while she tries to quit smoking. She also reports a head cold for 2 days. This is characterized by postnasal drip, sore scratchy throat, and cough. She denies any chest pain shortness of breath or dyspnea on exertion. She denies any fevers or chills. Her restless leg syndrome is starting to worsen. She is now having breakthrough symptoms throughout the day. The 1 mg pill she takes at night helps but she is now taking it sometimes twice a day. It does help control her symptoms when she takes it during the day. It does not cause her to have any side effects Past Medical History  Diagnosis Date  . Duodenitis without mention of hemorrhage   . Diverticulosis of colon (without mention of hemorrhage)   . Hemorrhoids   . Anxiety   . CAD (coronary artery disease)   . Arthritis   . Insomnia   . Tinnitus   . Vertigo   . Diabetes mellitus   . Depression   . GERD (gastroesophageal reflux disease)   . Hypercholesteremia   . Hypertension   . Chronic kidney disease     Renal insufficiency   Past Surgical History  Procedure Laterality Date  . Cholecystectomy    . Angioplasty    . Tubal ligation    . Rotator cuff repair Right 04/2012   Current Outpatient Prescriptions on File Prior to Visit  Medication Sig Dispense Refill  . albuterol (PROVENTIL HFA;VENTOLIN HFA) 108 (90 BASE) MCG/ACT inhaler Inhale 2 puffs into the lungs every 4 (four) hours as needed for wheezing or shortness of breath. 1 Inhaler 2  . aspirin 325 MG tablet Take 325 mg by mouth daily.    Marland Kitchen atorvastatin (LIPITOR) 20 MG tablet Take 1 tablet (20 mg total) by mouth daily. 90 tablet 3  . dexlansoprazole (DEXILANT) 60 MG capsule Take 1 capsule  (60 mg total) by mouth daily. 90 capsule 3  . fexofenadine (ALLEGRA) 180 MG tablet Take 1 tablet (180 mg total) by mouth daily. 90 tablet 3  . glucose blood (FREESTYLE LITE) test strip Pt checks BS bid - also needs lancets disp: 1 box with prn refills 100 each 11  . HYDROcodone-acetaminophen (NORCO) 10-325 MG per tablet Take 1 tablet by mouth every 6 (six) hours as needed.    Marland Kitchen losartan (COZAAR) 50 MG tablet Take 1 tablet (50 mg total) by mouth daily. 90 tablet 3  . metFORMIN (GLUCOPHAGE) 1000 MG tablet Take 1 tablet (1,000 mg total) by mouth 2 (two) times daily with a meal. 180 tablet 3  . mometasone (NASONEX) 50 MCG/ACT nasal spray Place 2 sprays into the nose daily. Please dispense 3 bottles for a 3 month supply 51 g 3  . oxymorphone (OPANA ER) 20 MG 12 hr tablet Take 20 mg by mouth every 12 (twelve) hours.    Marland Kitchen rOPINIRole (REQUIP) 2 MG tablet Take 1 tablet (2 mg total) by mouth at bedtime. 90 tablet 3  . rOPINIRole (REQUIP) 2 MG tablet TAKE 1 TABLET BY MOUTH AT BEDTIME. 30 tablet 5   No current facility-administered medications on file prior to visit.   Allergies  Allergen Reactions  . Guaifenesin &  Derivatives     Severe Reaction  . Benazepril Other (See Comments)    cough  . Latex     REACTION: powder on gloves  . Morphine     REACTION: nausea  . Zyrtec [Cetirizine] Other (See Comments)    swelling   History   Social History  . Marital Status: Married    Spouse Name: N/A  . Number of Children: 1  . Years of Education: N/A   Occupational History  . Retired    Social History Main Topics  . Smoking status: Current Every Day Smoker    Last Attempt to Quit: 09/11/2008  . Smokeless tobacco: Never Used  . Alcohol Use: No  . Drug Use: No  . Sexual Activity: Not on file   Other Topics Concern  . Not on file   Social History Narrative     Review of Systems  All other systems reviewed and are negative.      Objective:   Physical Exam  Constitutional: She appears  well-developed and well-nourished.  HENT:  Right Ear: External ear normal.  Left Ear: External ear normal.  Nose: Nose normal.  Mouth/Throat: Oropharynx is clear and moist. No oropharyngeal exudate.  Eyes: Conjunctivae and EOM are normal. Pupils are equal, round, and reactive to light.  Neck: Neck supple. No JVD present.  Cardiovascular: Normal rate, regular rhythm and normal heart sounds.  Exam reveals no gallop and no friction rub.   No murmur heard. Pulmonary/Chest: Effort normal and breath sounds normal. No respiratory distress. She has no wheezes. She has no rales. She exhibits no tenderness.  Lymphadenopathy:    She has no cervical adenopathy.  Vitals reviewed.         Assessment & Plan:  Encounter for weight loss counseling  RLS (restless legs syndrome)  Him that she has a history of coronary artery disease, she is asymptomatic. Furthermore she is on using medication for a short period of time. I'm okay with her trying phentermine for one month while she tries to quit smoking. If she develops any chest pain or palpitations I want her to quit the medication immediately and notify me. I believe the patient does have a head cold. I recommended tincture of time and symptomatic care only. I recommended increasing her Requip to 1 mg by mouth 3 times a day as needed for restless leg syndrome symptoms

## 2015-02-23 ENCOUNTER — Telehealth: Payer: Self-pay | Admitting: Family Medicine

## 2015-02-23 MED ORDER — AMOXICILLIN-POT CLAVULANATE 875-125 MG PO TABS: 1.0000 | ORAL_TABLET | Freq: Two times a day (BID) | ORAL | Status: DC

## 2015-02-23 NOTE — Telephone Encounter (Signed)
augmentin 875 bid for 10 days

## 2015-02-23 NOTE — Telephone Encounter (Signed)
Pt called and made aware of RX.  If not better after this will need to see in office

## 2015-02-23 NOTE — Telephone Encounter (Signed)
Still sick from last week.  Still with thick secretions, greenish-yellow. Chills, feeling weak.  Can you call something in?

## 2015-02-26 DIAGNOSIS — G8929 Other chronic pain: Secondary | ICD-10-CM | POA: Diagnosis not present

## 2015-02-26 DIAGNOSIS — M545 Low back pain: Secondary | ICD-10-CM | POA: Diagnosis not present

## 2015-02-28 ENCOUNTER — Ambulatory Visit (HOSPITAL_COMMUNITY)
Admission: RE | Admit: 2015-02-28 | Discharge: 2015-02-28 | Disposition: A | Discharge status: Home / Self Care | Payer: Medicare Other | Source: Ambulatory Visit | Attending: Family Medicine | Admitting: Family Medicine

## 2015-02-28 DIAGNOSIS — Z1231 Encounter for screening mammogram for malignant neoplasm of breast: Secondary | ICD-10-CM | POA: Insufficient documentation

## 2015-03-26 DIAGNOSIS — M545 Low back pain: Secondary | ICD-10-CM | POA: Diagnosis not present

## 2015-03-26 DIAGNOSIS — G8929 Other chronic pain: Secondary | ICD-10-CM | POA: Diagnosis not present

## 2015-03-26 DIAGNOSIS — M542 Cervicalgia: Secondary | ICD-10-CM | POA: Diagnosis not present

## 2015-05-03 ENCOUNTER — Ambulatory Visit (INDEPENDENT_AMBULATORY_CARE_PROVIDER_SITE_OTHER): Payer: Medicare Other | Admitting: Family Medicine

## 2015-05-03 DIAGNOSIS — Z23 Encounter for immunization: Secondary | ICD-10-CM | POA: Diagnosis not present

## 2015-05-04 DIAGNOSIS — M545 Low back pain: Secondary | ICD-10-CM | POA: Diagnosis not present

## 2015-05-04 DIAGNOSIS — M5412 Radiculopathy, cervical region: Secondary | ICD-10-CM | POA: Diagnosis not present

## 2015-05-04 DIAGNOSIS — R202 Paresthesia of skin: Secondary | ICD-10-CM | POA: Diagnosis not present

## 2015-05-04 DIAGNOSIS — G890 Central pain syndrome: Secondary | ICD-10-CM | POA: Diagnosis not present

## 2015-05-23 ENCOUNTER — Other Ambulatory Visit: Payer: Self-pay | Admitting: Physician Assistant

## 2015-05-23 DIAGNOSIS — B078 Other viral warts: Secondary | ICD-10-CM | POA: Diagnosis not present

## 2015-05-23 DIAGNOSIS — L821 Other seborrheic keratosis: Secondary | ICD-10-CM | POA: Diagnosis not present

## 2015-05-23 DIAGNOSIS — D239 Other benign neoplasm of skin, unspecified: Secondary | ICD-10-CM | POA: Diagnosis not present

## 2015-05-23 DIAGNOSIS — D485 Neoplasm of uncertain behavior of skin: Secondary | ICD-10-CM | POA: Diagnosis not present

## 2015-05-30 LAB — HM DIABETES EYE EXAM

## 2015-06-01 DIAGNOSIS — M545 Low back pain: Secondary | ICD-10-CM | POA: Diagnosis not present

## 2015-06-01 DIAGNOSIS — G8929 Other chronic pain: Secondary | ICD-10-CM | POA: Diagnosis not present

## 2015-06-01 DIAGNOSIS — M542 Cervicalgia: Secondary | ICD-10-CM | POA: Diagnosis not present

## 2015-06-01 DIAGNOSIS — K5909 Other constipation: Secondary | ICD-10-CM | POA: Diagnosis not present

## 2015-06-01 DIAGNOSIS — M25512 Pain in left shoulder: Secondary | ICD-10-CM | POA: Diagnosis not present

## 2015-06-01 DIAGNOSIS — M5413 Radiculopathy, cervicothoracic region: Secondary | ICD-10-CM | POA: Diagnosis not present

## 2015-06-01 DIAGNOSIS — M25511 Pain in right shoulder: Secondary | ICD-10-CM | POA: Diagnosis not present

## 2015-06-01 DIAGNOSIS — R202 Paresthesia of skin: Secondary | ICD-10-CM | POA: Diagnosis not present

## 2015-06-28 DIAGNOSIS — G8929 Other chronic pain: Secondary | ICD-10-CM | POA: Diagnosis not present

## 2015-06-28 DIAGNOSIS — M545 Low back pain: Secondary | ICD-10-CM | POA: Diagnosis not present

## 2015-07-03 ENCOUNTER — Other Ambulatory Visit: Payer: Self-pay | Admitting: Family Medicine

## 2015-07-03 MED ORDER — ROPINIROLE HCL 2 MG PO TABS: 2.0000 mg | ORAL_TABLET | Freq: Every day | ORAL | Status: DC

## 2015-07-09 DIAGNOSIS — H02403 Unspecified ptosis of bilateral eyelids: Secondary | ICD-10-CM | POA: Diagnosis not present

## 2015-07-16 DIAGNOSIS — H02403 Unspecified ptosis of bilateral eyelids: Secondary | ICD-10-CM | POA: Diagnosis not present

## 2015-07-16 DIAGNOSIS — H02831 Dermatochalasis of right upper eyelid: Secondary | ICD-10-CM | POA: Diagnosis not present

## 2015-07-16 DIAGNOSIS — H02402 Unspecified ptosis of left eyelid: Secondary | ICD-10-CM | POA: Diagnosis not present

## 2015-07-16 DIAGNOSIS — E119 Type 2 diabetes mellitus without complications: Secondary | ICD-10-CM | POA: Diagnosis not present

## 2015-07-16 DIAGNOSIS — H02834 Dermatochalasis of left upper eyelid: Secondary | ICD-10-CM | POA: Diagnosis not present

## 2015-07-16 DIAGNOSIS — H02401 Unspecified ptosis of right eyelid: Secondary | ICD-10-CM | POA: Diagnosis not present

## 2015-07-26 DIAGNOSIS — M545 Low back pain: Secondary | ICD-10-CM | POA: Diagnosis not present

## 2015-07-26 DIAGNOSIS — G8929 Other chronic pain: Secondary | ICD-10-CM | POA: Diagnosis not present

## 2015-07-26 DIAGNOSIS — G890 Central pain syndrome: Secondary | ICD-10-CM | POA: Diagnosis not present

## 2015-08-09 DIAGNOSIS — F4321 Adjustment disorder with depressed mood: Secondary | ICD-10-CM | POA: Diagnosis not present

## 2015-08-28 DIAGNOSIS — H40053 Ocular hypertension, bilateral: Secondary | ICD-10-CM | POA: Diagnosis not present

## 2015-09-03 ENCOUNTER — Ambulatory Visit (HOSPITAL_COMMUNITY): Payer: Medicare Other | Admitting: Psychiatry

## 2015-09-10 ENCOUNTER — Ambulatory Visit (INDEPENDENT_AMBULATORY_CARE_PROVIDER_SITE_OTHER): Payer: Medicare Other | Admitting: Physician Assistant

## 2015-09-10 ENCOUNTER — Encounter: Payer: Self-pay | Admitting: Physician Assistant

## 2015-09-10 VITALS — BP 112/60 | HR 88 | Temp 98.1°F | Resp 20 | Ht 64.0 in | Wt 178.0 lb

## 2015-09-10 DIAGNOSIS — N3281 Overactive bladder: Secondary | ICD-10-CM

## 2015-09-10 DIAGNOSIS — Z01419 Encounter for gynecological examination (general) (routine) without abnormal findings: Secondary | ICD-10-CM

## 2015-09-10 DIAGNOSIS — Z1239 Encounter for other screening for malignant neoplasm of breast: Secondary | ICD-10-CM

## 2015-09-10 DIAGNOSIS — Z124 Encounter for screening for malignant neoplasm of cervix: Secondary | ICD-10-CM

## 2015-09-10 MED ORDER — TOLTERODINE TARTRATE ER 2 MG PO CP24: 2.0000 mg | ORAL_CAPSULE | Freq: Every day | ORAL | Status: DC

## 2015-09-10 NOTE — Progress Notes (Signed)
Patient ID: [name] [name] MRN: [id number], DOB: [birth year], 65 y.o. Date of Encounter: @DATE @  Chief Complaint:  Chief Complaint  Patient presents with  . here for PAP only    discuss freq urination at night    HPI: 65 y.o. year old white female  presents with above.  She sees Dr. Dennard Schaumann as her routine primary care provider and is here today for her GYN exam was a female provider. She reports that she has not had a hysterectomy.  Her only other concern that she wanted to discuss today is frequent urination at night. She says that she usually wakes up 3 or 4 times per night to urinate. Visit is only a small amount but she does feel that that is what wakes her up is the sensation that she needs to urinate. She says that she really doesn't have this problem much during the day. Also does not have much incontinence. Says that very rarely if she laughs or somehting, he may leak a small amount of urine. Over this does not happen often. Says that she does not have any incontinence with coughing or sneezing. Is having no dysuria and no pelvic pain no fevers or chills.   Past Medical History  Diagnosis Date  . Duodenitis without mention of hemorrhage   . Diverticulosis of colon (without mention of hemorrhage)   . Hemorrhoids   . Anxiety   . CAD (coronary artery disease)   . Arthritis   . Insomnia   . Tinnitus   . Vertigo   . Diabetes mellitus   . Depression   . GERD (gastroesophageal reflux disease)   . Hypercholesteremia   . Hypertension   . Chronic kidney disease     Renal insufficiency     Home Meds: Outpatient Prescriptions Prior to Visit  Medication Sig Dispense Refill  . albuterol (PROVENTIL HFA;VENTOLIN HFA) 108 (90 BASE) MCG/ACT inhaler Inhale 2 puffs into the lungs every 4 (four) hours as needed for wheezing or shortness of breath. 1 Inhaler 2  . aspirin 325 MG tablet Take 325 mg by mouth daily.    Marland Kitchen atorvastatin (LIPITOR) 20 MG tablet Take 1 tablet (20 mg total) by  mouth daily. 90 tablet 3  . dexlansoprazole (DEXILANT) 60 MG capsule Take 1 capsule (60 mg total) by mouth daily. 90 capsule 3  . diltiazem (CARDIZEM) 120 MG tablet Take 1 tablet (120 mg total) by mouth daily. 30 tablet 3  . fexofenadine (ALLEGRA) 180 MG tablet Take 1 tablet (180 mg total) by mouth daily. 90 tablet 3  . glucose blood (FREESTYLE LITE) test strip Pt checks BS bid - also needs lancets disp: 1 box with prn refills 100 each 11  . HYDROcodone-acetaminophen (NORCO) 10-325 MG per tablet Take 1 tablet by mouth every 6 (six) hours as needed.    Marland Kitchen losartan (COZAAR) 50 MG tablet Take 1 tablet (50 mg total) by mouth daily. 90 tablet 3  . metFORMIN (GLUCOPHAGE) 1000 MG tablet Take 1 tablet (1,000 mg total) by mouth 2 (two) times daily with a meal. 180 tablet 3  . mometasone (NASONEX) 50 MCG/ACT nasal spray Place 2 sprays into the nose daily. Please dispense 3 bottles for a 3 month supply 51 g 3  . oxymorphone (OPANA ER) 20 MG 12 hr tablet Take 20 mg by mouth every 12 (twelve) hours.    . phentermine 37.5 MG capsule Take 1 capsule (37.5 mg total) by mouth every morning. 30 capsule 0  . rOPINIRole (REQUIP)  2 MG tablet Take 1 tablet (2 mg total) by mouth at bedtime. 90 tablet 3  . amoxicillin-clavulanate (AUGMENTIN) 875-125 MG per tablet Take 1 tablet by mouth 2 (two) times daily. 20 tablet 0   No facility-administered medications prior to visit.    Allergies:  Allergies  Allergen Reactions  . Guaifenesin & Derivatives     Severe Reaction  . Benazepril Other (See Comments)    cough  . Latex     REACTION: powder on gloves  . Morphine     REACTION: nausea  . Zyrtec [Cetirizine] Other (See Comments)    swelling    Social History   Social History  . Marital Status: Married    Spouse Name: N/A  . Number of Children: 1  . Years of Education: N/A   Occupational History  . Retired    Social History Main Topics  . Smoking status: Current Every Day Smoker    Last Attempt to Quit:  09/11/2008  . Smokeless tobacco: Never Used  . Alcohol Use: No  . Drug Use: No  . Sexual Activity: Not on file   Other Topics Concern  . Not on file   Social History Narrative    Family History  Problem Relation Age of Onset  . Diabetes Mother   . Hemophilia Mother   . Diabetes Brother   . Breast cancer Sister   . Heart disease Father   . Colon polyps Father   . Colon polyps Sister      Review of Systems:  See HPI for pertinent ROS. All other ROS negative.    Physical Exam: Blood pressure 112/60, pulse 88, temperature 98.1 F (36.7 C), temperature source Oral, resp. rate 20, height 5\' 4"  (1.626 m), weight 178 lb (80.74 kg)., Body mass index is 30.54 kg/(m^2). General: WNWD WF. Appears in no acute distress. Neck: Supple. No thyromegaly. No lymphadenopathy. Lungs: Clear bilaterally to auscultation without wheezes, rales, or rhonchi. Breathing is unlabored. Heart: RRR with S1 S2. No murmurs, rubs, or gallops. Abdomen: Soft, non-tender, non-distended with normoactive bowel sounds. No hepatomegaly. No rebound/guarding. No obvious abdominal masses. Breast Exam: Inspection is normal. Breasts are symmetrical. No skin lesions/rashes. Palpation is normal with no masses. No nipple discharge. Pelvic exam: External genitalia normal. Vaginal mucosa normal. Cervix normal. Bimanual exam is normal with no masses palpated. Musculoskeletal:  Strength and tone normal for age. Extremities/Skin: Warm and dry. Neuro: Alert and oriented X 3. Moves all extremities spontaneously. Gait is normal. CNII-XII grossly in tact. Psych:  Responds to questions appropriately with a normal affect.     ASSESSMENT AND PLAN:  65 y.o. year old female with  1. Screening breast examination Breast exam is normal. She had mammogram 02/28/2015 which was negative.  2. Encounter for cervical Pap smear with pelvic exam Pelvic exam is normal. Will send off Pap smear. - PAP, ThinPrep, Imaging, Medicare  3.  Overactive bladder She is to take the Detrol LA 2 mg daily. Plain to her that she does not get relief of symptoms with this that we can increase the dose or change to a different medication. He is to let me know if she is not getting symptom management with this or she has adverse effects with this. - tolterodine (DETROL LA) 2 MG 24 hr capsule; Take 1 capsule (2 mg total) by mouth daily.  Dispense: 30 capsule; Refill: 3   Signed, [address] [place], [place], [facility] 09/10/2015 9:05 AM

## 2015-09-12 ENCOUNTER — Encounter: Payer: Self-pay | Admitting: Family Medicine

## 2015-09-12 LAB — PAP, THIN PREP, IMAGING, MEDICARE

## 2015-09-25 DIAGNOSIS — H40053 Ocular hypertension, bilateral: Secondary | ICD-10-CM | POA: Diagnosis not present

## 2015-09-26 DIAGNOSIS — M545 Low back pain: Secondary | ICD-10-CM | POA: Diagnosis not present

## 2015-09-26 DIAGNOSIS — Z79891 Long term (current) use of opiate analgesic: Secondary | ICD-10-CM | POA: Diagnosis not present

## 2015-09-26 DIAGNOSIS — G894 Chronic pain syndrome: Secondary | ICD-10-CM | POA: Diagnosis not present

## 2015-09-26 DIAGNOSIS — M25512 Pain in left shoulder: Secondary | ICD-10-CM | POA: Diagnosis not present

## 2015-09-26 DIAGNOSIS — M25511 Pain in right shoulder: Secondary | ICD-10-CM | POA: Diagnosis not present

## 2015-09-26 DIAGNOSIS — M542 Cervicalgia: Secondary | ICD-10-CM | POA: Diagnosis not present

## 2015-10-25 DIAGNOSIS — Z79891 Long term (current) use of opiate analgesic: Secondary | ICD-10-CM | POA: Diagnosis not present

## 2015-10-25 DIAGNOSIS — M542 Cervicalgia: Secondary | ICD-10-CM | POA: Diagnosis not present

## 2015-10-25 DIAGNOSIS — M545 Low back pain: Secondary | ICD-10-CM | POA: Diagnosis not present

## 2015-10-25 DIAGNOSIS — M25511 Pain in right shoulder: Secondary | ICD-10-CM | POA: Diagnosis not present

## 2015-10-25 DIAGNOSIS — M25512 Pain in left shoulder: Secondary | ICD-10-CM | POA: Diagnosis not present

## 2015-10-25 DIAGNOSIS — G894 Chronic pain syndrome: Secondary | ICD-10-CM | POA: Diagnosis not present

## 2015-10-26 ENCOUNTER — Encounter (HOSPITAL_COMMUNITY): Payer: Self-pay

## 2015-10-26 ENCOUNTER — Inpatient Hospital Stay (HOSPITAL_COMMUNITY)
Admission: EM | Admit: 2015-10-26 | Discharge: 2015-10-28 | DRG: 189 | Disposition: A | Discharge status: Home / Self Care | Payer: Medicare Other | Attending: Family Medicine | Admitting: Family Medicine

## 2015-10-26 ENCOUNTER — Ambulatory Visit: Payer: Medicare Other | Admitting: Family Medicine

## 2015-10-26 ENCOUNTER — Emergency Department (HOSPITAL_COMMUNITY): Payer: Medicare Other

## 2015-10-26 DIAGNOSIS — R0602 Shortness of breath: Secondary | ICD-10-CM

## 2015-10-26 DIAGNOSIS — N189 Chronic kidney disease, unspecified: Secondary | ICD-10-CM | POA: Diagnosis present

## 2015-10-26 DIAGNOSIS — R079 Chest pain, unspecified: Secondary | ICD-10-CM | POA: Diagnosis present

## 2015-10-26 DIAGNOSIS — J189 Pneumonia, unspecified organism: Secondary | ICD-10-CM

## 2015-10-26 DIAGNOSIS — I10 Essential (primary) hypertension: Secondary | ICD-10-CM | POA: Diagnosis not present

## 2015-10-26 DIAGNOSIS — I2510 Atherosclerotic heart disease of native coronary artery without angina pectoris: Secondary | ICD-10-CM | POA: Diagnosis present

## 2015-10-26 DIAGNOSIS — E119 Type 2 diabetes mellitus without complications: Secondary | ICD-10-CM

## 2015-10-26 DIAGNOSIS — R05 Cough: Secondary | ICD-10-CM | POA: Diagnosis not present

## 2015-10-26 DIAGNOSIS — R42 Dizziness and giddiness: Secondary | ICD-10-CM | POA: Diagnosis not present

## 2015-10-26 DIAGNOSIS — I129 Hypertensive chronic kidney disease with stage 1 through stage 4 chronic kidney disease, or unspecified chronic kidney disease: Secondary | ICD-10-CM | POA: Diagnosis present

## 2015-10-26 DIAGNOSIS — J440 Chronic obstructive pulmonary disease with acute lower respiratory infection: Secondary | ICD-10-CM | POA: Diagnosis present

## 2015-10-26 DIAGNOSIS — J9601 Acute respiratory failure with hypoxia: Secondary | ICD-10-CM | POA: Diagnosis not present

## 2015-10-26 DIAGNOSIS — K219 Gastro-esophageal reflux disease without esophagitis: Secondary | ICD-10-CM | POA: Diagnosis present

## 2015-10-26 DIAGNOSIS — F17200 Nicotine dependence, unspecified, uncomplicated: Secondary | ICD-10-CM | POA: Diagnosis not present

## 2015-10-26 DIAGNOSIS — E663 Overweight: Secondary | ICD-10-CM | POA: Diagnosis present

## 2015-10-26 DIAGNOSIS — Z23 Encounter for immunization: Secondary | ICD-10-CM

## 2015-10-26 DIAGNOSIS — E1122 Type 2 diabetes mellitus with diabetic chronic kidney disease: Secondary | ICD-10-CM | POA: Diagnosis present

## 2015-10-26 DIAGNOSIS — J181 Lobar pneumonia, unspecified organism: Secondary | ICD-10-CM | POA: Diagnosis not present

## 2015-10-26 LAB — CBC
HEMATOCRIT: 41.1 % (ref 36.0–46.0)
HEMOGLOBIN: 13.2 g/dL (ref 12.0–15.0)
MCH: 28.6 pg (ref 26.0–34.0)
MCHC: 32.1 g/dL (ref 30.0–36.0)
MCV: 89.2 fL (ref 78.0–100.0)
Platelets: 234 10*3/uL (ref 150–400)
RBC: 4.61 MIL/uL (ref 3.87–5.11)
RDW: 14.6 % (ref 11.5–15.5)
WBC: 5.5 10*3/uL (ref 4.0–10.5)

## 2015-10-26 LAB — COMPREHENSIVE METABOLIC PANEL
ALBUMIN: 3.3 g/dL — AB (ref 3.5–5.0)
ALK PHOS: 50 U/L (ref 38–126)
ALT: 25 U/L (ref 14–54)
AST: 34 U/L (ref 15–41)
Anion gap: 15 (ref 5–15)
BUN: 11 mg/dL (ref 6–20)
CALCIUM: 8.7 mg/dL — AB (ref 8.9–10.3)
CHLORIDE: 93 mmol/L — AB (ref 101–111)
CO2: 25 mmol/L (ref 22–32)
CREATININE: 0.76 mg/dL (ref 0.44–1.00)
GFR calc non Af Amer: >60 mL/min (ref >60)
GLUCOSE: 100 mg/dL — AB (ref 65–99)
Potassium: 3.7 mmol/L (ref 3.5–5.1)
SODIUM: 133 mmol/L — AB (ref 135–145)
Total Bilirubin: 0.2 mg/dL — ABNORMAL LOW (ref 0.3–1.2)
Total Protein: 7.1 g/dL (ref 6.5–8.1)

## 2015-10-26 LAB — D-DIMER, QUANTITATIVE (NOT AT ARMC): D DIMER QUANT: 1.35 ug{FEU}/mL — AB (ref 0.00–0.50)

## 2015-10-26 LAB — I-STAT TROPONIN, ED: Troponin i, poc: 0 ng/mL (ref 0.00–0.08)

## 2015-10-26 LAB — GLUCOSE, CAPILLARY: Glucose-Capillary: 257 mg/dL — ABNORMAL HIGH (ref 65–99)

## 2015-10-26 LAB — LIPASE, BLOOD: Lipase: 21 U/L (ref 11–51)

## 2015-10-26 LAB — INFLUENZA PANEL BY PCR (TYPE A & B)
H1N1 flu by pcr: NOT DETECTED
Influenza A By PCR: NEGATIVE
Influenza B By PCR: NEGATIVE

## 2015-10-26 LAB — STREP PNEUMONIAE URINARY ANTIGEN: Strep Pneumo Urinary Antigen: NEGATIVE

## 2015-10-26 LAB — TROPONIN I: Troponin I: <0.03 ng/mL (ref <0.031)

## 2015-10-26 LAB — CBG MONITORING, ED: Glucose-Capillary: 246 mg/dL — ABNORMAL HIGH (ref 65–99)

## 2015-10-26 MED ORDER — TRAZODONE HCL 50 MG PO TABS
25.0000 mg | ORAL_TABLET | Freq: Every evening | ORAL | Status: DC | PRN
  Administered 2015-10-26: 25 mg via ORAL
  Filled 2015-10-26 (×2): qty 1

## 2015-10-26 MED ORDER — PNEUMOCOCCAL VAC POLYVALENT 25 MCG/0.5ML IJ INJ
0.5000 mL | INJECTION | INTRAMUSCULAR | Status: AC
Start: 2015-10-27 — End: 2015-10-28
  Administered 2015-10-28: 0.5 mL via INTRAMUSCULAR
  Filled 2015-10-26: qty 1
  Filled 2015-10-26: qty 0.5

## 2015-10-26 MED ORDER — LATANOPROST 0.005 % OP SOLN
1.0000 [drp] | Freq: Every day | OPHTHALMIC | Status: DC
  Administered 2015-10-26 – 2015-10-27 (×2): 1 [drp] via OPHTHALMIC
  Filled 2015-10-26: qty 2.5

## 2015-10-26 MED ORDER — ASPIRIN 325 MG PO TABS
325.0000 mg | ORAL_TABLET | Freq: Every day | ORAL | Status: DC
  Administered 2015-10-26 – 2015-10-28 (×3): 325 mg via ORAL
  Filled 2015-10-26 (×3): qty 1

## 2015-10-26 MED ORDER — HYDROCODONE-ACETAMINOPHEN 10-325 MG PO TABS
1.0000 | ORAL_TABLET | Freq: Four times a day (QID) | ORAL | Status: DC | PRN
  Administered 2015-10-26 – 2015-10-28 (×4): 1 via ORAL
  Filled 2015-10-26 (×5): qty 1

## 2015-10-26 MED ORDER — FESOTERODINE FUMARATE ER 4 MG PO TB24
4.0000 mg | ORAL_TABLET | Freq: Every day | ORAL | Status: DC
  Administered 2015-10-26 – 2015-10-28 (×3): 4 mg via ORAL
  Filled 2015-10-26 (×5): qty 1

## 2015-10-26 MED ORDER — ONDANSETRON HCL 4 MG PO TABS: 4.0000 mg | ORAL_TABLET | Freq: Four times a day (QID) | ORAL | Status: DC | PRN

## 2015-10-26 MED ORDER — SODIUM CHLORIDE 0.9% FLUSH
3.0000 mL | Freq: Two times a day (BID) | INTRAVENOUS | Status: DC
  Administered 2015-10-26 – 2015-10-27 (×2): 3 mL via INTRAVENOUS

## 2015-10-26 MED ORDER — ENOXAPARIN SODIUM 40 MG/0.4ML ~~LOC~~ SOLN
40.0000 mg | SUBCUTANEOUS | Status: DC
  Administered 2015-10-26 – 2015-10-27 (×2): 40 mg via SUBCUTANEOUS
  Filled 2015-10-26 (×2): qty 0.4

## 2015-10-26 MED ORDER — LORATADINE 10 MG PO TABS
10.0000 mg | ORAL_TABLET | Freq: Every day | ORAL | Status: DC
  Filled 2015-10-26 (×3): qty 1

## 2015-10-26 MED ORDER — LOSARTAN POTASSIUM 50 MG PO TABS
50.0000 mg | ORAL_TABLET | Freq: Every day | ORAL | Status: DC
  Administered 2015-10-27 – 2015-10-28 (×2): 50 mg via ORAL
  Filled 2015-10-26 (×3): qty 1

## 2015-10-26 MED ORDER — PANTOPRAZOLE SODIUM 40 MG PO TBEC
40.0000 mg | DELAYED_RELEASE_TABLET | Freq: Every day | ORAL | Status: DC
  Administered 2015-10-27 – 2015-10-28 (×2): 40 mg via ORAL
  Filled 2015-10-26 (×3): qty 1

## 2015-10-26 MED ORDER — ACETAMINOPHEN 650 MG RE SUPP: 650.0000 mg | Freq: Four times a day (QID) | RECTAL | Status: DC | PRN

## 2015-10-26 MED ORDER — ALBUTEROL SULFATE (2.5 MG/3ML) 0.083% IN NEBU
2.5000 mg | INHALATION_SOLUTION | Freq: Three times a day (TID) | RESPIRATORY_TRACT | Status: DC
  Administered 2015-10-27 – 2015-10-28 (×4): 2.5 mg via RESPIRATORY_TRACT
  Filled 2015-10-26 (×4): qty 3

## 2015-10-26 MED ORDER — DEXTROSE 5 % IV SOLN
1.0000 g | INTRAVENOUS | Status: DC
  Administered 2015-10-26 – 2015-10-27 (×2): 1 g via INTRAVENOUS
  Filled 2015-10-26 (×3): qty 10

## 2015-10-26 MED ORDER — ALBUTEROL SULFATE (2.5 MG/3ML) 0.083% IN NEBU: 2.5000 mg | INHALATION_SOLUTION | RESPIRATORY_TRACT | Status: DC | PRN

## 2015-10-26 MED ORDER — AZITHROMYCIN 500 MG PO TABS
500.0000 mg | ORAL_TABLET | ORAL | Status: DC
  Administered 2015-10-27: 500 mg via ORAL
  Filled 2015-10-26: qty 1

## 2015-10-26 MED ORDER — SODIUM CHLORIDE 0.9 % IV SOLN
INTRAVENOUS | Status: DC
  Administered 2015-10-26: 18:00:00 via INTRAVENOUS

## 2015-10-26 MED ORDER — METHYLPREDNISOLONE SODIUM SUCC 125 MG IJ SOLR
60.0000 mg | Freq: Two times a day (BID) | INTRAMUSCULAR | Status: DC
  Administered 2015-10-26 – 2015-10-28 (×4): 60 mg via INTRAVENOUS
  Filled 2015-10-26 (×4): qty 2

## 2015-10-26 MED ORDER — FLUTICASONE PROPIONATE 50 MCG/ACT NA SUSP
1.0000 | Freq: Every day | NASAL | Status: DC
  Administered 2015-10-26 – 2015-10-28 (×3): 1 via NASAL
  Filled 2015-10-26: qty 16

## 2015-10-26 MED ORDER — ONDANSETRON HCL 4 MG/2ML IJ SOLN: 4.0000 mg | Freq: Four times a day (QID) | INTRAMUSCULAR | Status: DC | PRN

## 2015-10-26 MED ORDER — INSULIN ASPART 100 UNIT/ML ~~LOC~~ SOLN
0.0000 [IU] | Freq: Three times a day (TID) | SUBCUTANEOUS | Status: DC
Start: 2015-10-26 — End: 2015-10-28
  Administered 2015-10-26: 3 [IU] via SUBCUTANEOUS
  Administered 2015-10-27: 1 [IU] via SUBCUTANEOUS
  Administered 2015-10-27: 3 [IU] via SUBCUTANEOUS
  Administered 2015-10-27: 2 [IU] via SUBCUTANEOUS
  Administered 2015-10-28: 1 [IU] via SUBCUTANEOUS
  Filled 2015-10-26: qty 1

## 2015-10-26 MED ORDER — GI COCKTAIL ~~LOC~~
30.0000 mL | Freq: Once | ORAL | Status: AC
  Administered 2015-10-26: 30 mL via ORAL
  Filled 2015-10-26: qty 30

## 2015-10-26 MED ORDER — LORAZEPAM 2 MG/ML IJ SOLN
1.0000 mg | Freq: Once | INTRAMUSCULAR | Status: DC
  Filled 2015-10-26: qty 1

## 2015-10-26 MED ORDER — DEXTROSE 5 % IV SOLN: 1.0000 g | INTRAVENOUS | Status: DC

## 2015-10-26 MED ORDER — MORPHINE SULFATE ER 30 MG PO TBCR
60.0000 mg | EXTENDED_RELEASE_TABLET | Freq: Two times a day (BID) | ORAL | Status: DC
  Filled 2015-10-26 (×3): qty 2

## 2015-10-26 MED ORDER — ACETAMINOPHEN 325 MG PO TABS: 650.0000 mg | ORAL_TABLET | Freq: Four times a day (QID) | ORAL | Status: DC | PRN

## 2015-10-26 MED ORDER — PHENTERMINE HCL 37.5 MG PO CAPS: 37.5000 mg | ORAL_CAPSULE | ORAL | Status: DC

## 2015-10-26 MED ORDER — ROPINIROLE HCL 1 MG PO TABS
2.0000 mg | ORAL_TABLET | Freq: Every day | ORAL | Status: DC
  Administered 2015-10-26 – 2015-10-27 (×2): 2 mg via ORAL
  Filled 2015-10-26 (×2): qty 2

## 2015-10-26 MED ORDER — DILTIAZEM HCL 60 MG PO TABS
120.0000 mg | ORAL_TABLET | Freq: Every day | ORAL | Status: DC
  Administered 2015-10-27 – 2015-10-28 (×2): 120 mg via ORAL
  Filled 2015-10-26 (×3): qty 2

## 2015-10-26 MED ORDER — IPRATROPIUM-ALBUTEROL 0.5-2.5 (3) MG/3ML IN SOLN
3.0000 mL | Freq: Once | RESPIRATORY_TRACT | Status: AC
  Administered 2015-10-26: 3 mL via RESPIRATORY_TRACT
  Filled 2015-10-26: qty 3

## 2015-10-26 MED ORDER — ATORVASTATIN CALCIUM 20 MG PO TABS
20.0000 mg | ORAL_TABLET | Freq: Every day | ORAL | Status: DC
  Administered 2015-10-26 – 2015-10-28 (×3): 20 mg via ORAL
  Filled 2015-10-26 (×2): qty 1
  Filled 2015-10-26: qty 2

## 2015-10-26 MED ORDER — AZITHROMYCIN 250 MG PO TABS
500.0000 mg | ORAL_TABLET | Freq: Once | ORAL | Status: AC
  Administered 2015-10-26: 500 mg via ORAL
  Filled 2015-10-26: qty 2

## 2015-10-26 MED ORDER — ALBUTEROL SULFATE (2.5 MG/3ML) 0.083% IN NEBU
2.5000 mg | INHALATION_SOLUTION | RESPIRATORY_TRACT | Status: DC
  Administered 2015-10-26 (×2): 2.5 mg via RESPIRATORY_TRACT
  Filled 2015-10-26 (×2): qty 3

## 2015-10-26 MED ORDER — PREDNISONE 20 MG PO TABS
60.0000 mg | ORAL_TABLET | Freq: Once | ORAL | Status: AC
  Administered 2015-10-26: 60 mg via ORAL
  Filled 2015-10-26: qty 3

## 2015-10-26 NOTE — ED Provider Notes (Signed)
CSN: [id number]     Arrival date & time 10/26/15  [id number] History   None    Chief Complaint  Patient presents with  . Chest Pain    HPI   65 y/o female presenting for chest pain that awoke her from sleep at approximately 12 AM last night. Pain is located largely in the upper central abdomen and lower central chest  and radiates  to her back and bilateral shoulders. Denies radiation to her neck or down either her arms.   Patient reports cough for the last 3 days with yellow/gray colored sputum porduction, and low-grade fever to Tmax of 100.3. She reports some nausea but no vomiting. She did have a headache yesterday which resolved.  Otherwise she denies headache, vision changes, vomiting, diarrhea, weakness  Past Medical History  Diagnosis Date  . Duodenitis without mention of hemorrhage   . Diverticulosis of colon (without mention of hemorrhage)   . Hemorrhoids   . Anxiety   . CAD (coronary artery disease)   . Arthritis   . Insomnia   . Tinnitus   . Vertigo   . Diabetes mellitus   . Depression   . GERD (gastroesophageal reflux disease)   . Hypercholesteremia   . Hypertension   . Chronic kidney disease     Renal insufficiency   Past Surgical History  Procedure Laterality Date  . Cholecystectomy    . Angioplasty    . Tubal ligation    . Rotator cuff repair Right 04/2012   Family History  Problem Relation Age of Onset  . Diabetes Mother   . Hemophilia Mother   . Diabetes Brother   . Breast cancer Sister   . Heart disease Father   . Colon polyps Father   . Colon polyps Sister    Social History  Substance Use Topics  . Smoking status: Current Every Day Smoker    Types: Cigarettes    Last Attempt to Quit: 09/11/2008  . Smokeless tobacco: Never Used  . Alcohol Use: No   OB History    No data available     Review of Systems  Constitutional: Negative.   HENT: Negative.   Eyes: Negative.   Respiratory: Positive for cough and shortness of breath. Negative for  apnea, choking, chest tightness, wheezing and stridor.   Cardiovascular: Positive for chest pain. Negative for palpitations and leg swelling.  Gastrointestinal: Negative.   Genitourinary: Negative.   Musculoskeletal: Negative.   Neurological: Positive for dizziness. Negative for tremors, seizures, syncope, facial asymmetry, speech difficulty, weakness, light-headedness, numbness and headaches.  Hematological: Negative.       Allergies  Guaifenesin & derivatives; Benazepril; Latex; Morphine; and Zyrtec  Home Medications   Prior to Admission medications   Medication Sig Start Date End Date Taking? Authorizing Provider  albuterol (PROVENTIL HFA;VENTOLIN HFA) 108 (90 BASE) MCG/ACT inhaler Inhale 2 puffs into the lungs every 4 (four) hours as needed for wheezing or shortness of breath. 11/03/14   Alycia Rossetti, MD  aspirin 325 MG tablet Take 325 mg by mouth daily.    Historical Provider, MD  atorvastatin (LIPITOR) 20 MG tablet Take 1 tablet (20 mg total) by mouth daily. 11/24/14   Alycia Rossetti, MD  dexlansoprazole (DEXILANT) 60 MG capsule Take 1 capsule (60 mg total) by mouth daily. 11/15/14   Susy Frizzle, MD  diltiazem (CARDIZEM) 120 MG tablet Take 1 tablet (120 mg total) by mouth daily. 02/15/15   Susy Frizzle, MD  fexofenadine (ALLEGRA) 180 MG  tablet Take 1 tablet (180 mg total) by mouth daily. 11/24/14   Alycia Rossetti, MD  glucose blood (FREESTYLE LITE) test strip Pt checks BS bid - also needs lancets disp: 1 box with prn refills 01/06/13   Susy Frizzle, MD  HYDROcodone-acetaminophen [facility]) 10-325 MG per tablet Take 1 tablet by mouth every 6 (six) hours as needed.    Historical Provider, MD  latanoprost (XALATAN) 0.005 % ophthalmic solution INSTILL 1 DROP INTO EACH EYE AT BEDTIME 08/28/15   Historical Provider, MD  losartan (COZAAR) 50 MG tablet Take 1 tablet (50 mg total) by mouth daily. 11/15/14   Susy Frizzle, MD  metFORMIN (GLUCOPHAGE) 1000 MG tablet Take 1 tablet  (1,000 mg total) by mouth 2 (two) times daily with a meal. 11/15/14   Susy Frizzle, MD  mometasone (NASONEX) 50 MCG/ACT nasal spray Place 2 sprays into the nose daily. Please dispense 3 bottles for a 3 month supply 11/11/13   Susy Frizzle, MD  oxymorphone (OPANA ER) 20 MG 12 hr tablet Take 20 mg by mouth every 12 (twelve) hours.    Historical Provider, MD  phentermine 37.5 MG capsule Take 1 capsule (37.5 mg total) by mouth every morning. 02/15/15   Susy Frizzle, MD  rOPINIRole (REQUIP) 2 MG tablet Take 1 tablet (2 mg total) by mouth at bedtime. 07/03/15   Susy Frizzle, MD  tolterodine (DETROL LA) 2 MG 24 hr capsule Take 1 capsule (2 mg total) by mouth daily. 09/10/15   Mary B Dixon, PA-C   BP 125/71 mmHg  Pulse 76  Temp(Src) 98 F (36.7 C) (Oral)  Resp 21  Ht 5\' 4"  (1.626 m)  Wt 79.379 kg  BMI 30.02 kg/m2  SpO2 99% Physical Exam  Constitutional: She is oriented to person, place, and time. She appears well-developed and well-nourished.  HENT:  Head: Normocephalic.  Eyes: EOM are normal.  Neck: Normal range of motion. Neck supple.  Cardiovascular: Normal rate and regular rhythm.   Pulmonary/Chest: Effort normal. No respiratory distress. She has wheezes.  Abdominal: Soft. Bowel sounds are normal. She exhibits no distension. There is no tenderness.  Musculoskeletal: Normal range of motion.  Neurological: She is alert and oriented to person, place, and time.  Skin: Skin is warm and dry.    ED Course  Procedures (including critical care time) Labs Review Labs Reviewed  COMPREHENSIVE METABOLIC PANEL - Abnormal; Notable for the following:    Sodium 133 (*)    Chloride 93 (*)    Glucose, Bld 100 (*)    Calcium 8.7 (*)    Albumin 3.3 (*)    Total Bilirubin 0.2 (*)    All other components within normal limits  CBC  LIPASE, BLOOD  D-DIMER, QUANTITATIVE (NOT AT [facility])  Randolm Idol, ED    Imaging Review Dg Chest 2 View  10/26/2015  CLINICAL DATA:  Chest pain EXAM:  CHEST  2 VIEW COMPARISON:  02/01/2009 FINDINGS: New right middle lobe airspace disease. Normal heart size. No pneumothorax. No pleural effusion. Left lung clear. IMPRESSION: Right middle lobe consolidation. Followup PA and lateral chest X-ray is recommended in 3-4 weeks following trial of antibiotic therapy to ensure resolution and exclude underlying malignancy. Electronically Signed   By: Marybelle Killings M.D.   On: 10/26/2015 11:19   I have personally reviewed and evaluated these images and lab results as part of my medical decision-making.   EKG Interpretation   Date/Time:  Friday October 26 2015 09:49:19 EDT Ventricular  Rate:  91 PR Interval:  142 QRS Duration: 78 QT Interval:  352 QTC Calculation: 432 R Axis:   102 Text Interpretation:  Normal sinus rhythm Rightward axis Borderline ECG No  significant change was found Confirmed by Wyvonnia Dusky  MD, STEPHEN ([id number]) on  10/26/2015 10:23:14 AM      MDM   Final diagnoses:  Shortness of breath  Chest pain, unspecified chest pain type   65 year old female presented with chest pain with respiratory distress with desaturation to 73% in triage. Given extensive history of smoking (40 pack years) and now with cough and increased sputum production and wheezes on exam, concern for COPD as underlying cause of respiratory distress.   Duonebs, prednisone started in the ED with improvement in respiratory symptoms.   EKG negative for signs of ischemia, troponin negative. Patient given GI cocktail for epigastric component of pain, with improvement of pain. Lipase was negative.  Chest XRay concerning for pneumonia. Azithromycin and rocephin started. Pateint intermittently having O2 desaturations to the 80s while sitting. On ambulation she was noted to have O2 desaturation to 88-90 with sensation of shortness of breath. Hospitalist consulted for admission, [facility] accepting  Makenzye Troutman A. Lincoln Brigham MD, [facility] Resident PGY-2 Pager [phone]     Veatrice Bourbon, MD 10/26/15 [place], MD 10/26/15 [phone]

## 2015-10-26 NOTE — ED Notes (Signed)
T. Reports being woken up from chest pain, sharp and in the center of her chest into her back.  Nothing helped the pain, and nothing makes it worse.  She is also having nausea, dizziness and sob.  She denies any cold symptoms or cough, ECG completed in TRiage. Skin is warm and dry.

## 2015-10-26 NOTE — ED Notes (Signed)
Report attempted 

## 2015-10-26 NOTE — ED Notes (Signed)
Pt able to ambulate in hallway with no difficulty. Pt O2 ranged from 88-90% while pulse ranged from 80-84. Pt states only complaints she has is feeling short of breath.

## 2015-10-26 NOTE — ED Notes (Signed)
Provider at the bedside.  

## 2015-10-26 NOTE — H&P (Signed)
[facility] History and Physical  [name] [name] [id number] DOB: 1951-06-02 DOA: 10/26/2015  Referring physician: Lincoln Brigham PCP: Odette Fraction, MD   Chief Complaint: chest pain  HPI: [name] [name] is a 65 y.o. female with a past medical history that includes CAD, diabetes, GERD, hypertension, chronic kidney disease, back or use presents to emergency Department chief complaint of chest pain and shortness of breath. Initial evaluation reveals acute respiratory failure with hypoxia chest x-ray concerning for pneumonia.  Information is obtained from the patient and her husband who is at the bedside. She reports last 3 days she has experienced generalized malaise increased cough subjective fever nausea without vomiting. She also reports decreased oral intake and max temperature 100.3. Last night she awakened around 12 AM with chest pain. She describes the pain as sharp epigastric area nonradiating and "like the worst indigestion ever had". She denies diaphoresis vomiting worsening shortness of breath. She denies headache dizziness visual disturbances syncope or near-syncope. She denies lower extremity edema orthopnea. She denies abdominal pain dysuria hematuria frequency or urgency.   In the emergency department she is afebrile and dynamically stable and hypoxic with an oxygen saturation level 88% on room air while ambulating. Provided with 60 mg of prednisone and DuoNeb's as well as antibiotics.  Review of Systems:  10 point review of systems complete and all systems are negative except as indicated in the history of present illness  Past Medical History  Diagnosis Date  . Duodenitis without mention of hemorrhage   . Diverticulosis of colon (without mention of hemorrhage)   . Hemorrhoids   . Anxiety   . CAD (coronary artery disease)   . Arthritis   . Insomnia   . Tinnitus   . Vertigo   . Diabetes mellitus   . Depression   . GERD (gastroesophageal reflux disease)   .  Hypercholesteremia   . Hypertension   . Chronic kidney disease     Renal insufficiency   Past Surgical History  Procedure Laterality Date  . Cholecystectomy    . Angioplasty    . Tubal ligation    . Rotator cuff repair Right 04/2012   Social History:  reports that she has been smoking Cigarettes.  She has never used smokeless tobacco. She reports that she does not drink alcohol or use illicit drugs. He lives at home in [place] slow with her husband has done so for the last 98 years. 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 Guaifenesin & Derivatives     Severe Reaction  . Benazepril Other (See Comments)    cough  . Latex     REACTION: powder on gloves  . Morphine     REACTION: nausea  . Zyrtec [Cetirizine] Other (See Comments)    swelling    Family History  Problem Relation Age of Onset  . Diabetes Mother   . Hemophilia Mother   . Diabetes Brother   . Breast cancer Sister   . Heart disease Father   . Colon polyps Father   . Colon polyps Sister      Prior to Admission medications   Medication Sig Start Date End Date Taking? Authorizing Provider  albuterol (PROVENTIL HFA;VENTOLIN HFA) 108 (90 BASE) MCG/ACT inhaler Inhale 2 puffs into the lungs every 4 (four) hours as needed for wheezing or shortness of breath. 11/03/14   Alycia Rossetti, MD  aspirin 325 MG tablet Take 325 mg by mouth daily.    Historical Provider,  MD  atorvastatin (LIPITOR) 20 MG tablet Take 1 tablet (20 mg total) by mouth daily. 11/24/14   Alycia Rossetti, MD  dexlansoprazole (DEXILANT) 60 MG capsule Take 1 capsule (60 mg total) by mouth daily. 11/15/14   Susy Frizzle, MD  diltiazem (CARDIZEM) 120 MG tablet Take 1 tablet (120 mg total) by mouth daily. 02/15/15   Susy Frizzle, MD  fexofenadine (ALLEGRA) 180 MG tablet Take 1 tablet (180 mg total) by mouth daily. 11/24/14   Alycia Rossetti, MD  glucose blood (FREESTYLE LITE) test strip Pt checks BS bid -  also needs lancets disp: 1 box with prn refills 01/06/13   Susy Frizzle, MD  HYDROcodone-acetaminophen [facility]) 10-325 MG per tablet Take 1 tablet by mouth every 6 (six) hours as needed.    Historical Provider, MD  latanoprost (XALATAN) 0.005 % ophthalmic solution INSTILL 1 DROP INTO EACH EYE AT BEDTIME 08/28/15   Historical Provider, MD  losartan (COZAAR) 50 MG tablet Take 1 tablet (50 mg total) by mouth daily. 11/15/14   Susy Frizzle, MD  metFORMIN (GLUCOPHAGE) 1000 MG tablet Take 1 tablet (1,000 mg total) by mouth 2 (two) times daily with a meal. 11/15/14   Susy Frizzle, MD  mometasone (NASONEX) 50 MCG/ACT nasal spray Place 2 sprays into the nose daily. Please dispense 3 bottles for a 3 month supply 11/11/13   Susy Frizzle, MD  oxymorphone (OPANA ER) 20 MG 12 hr tablet Take 20 mg by mouth every 12 (twelve) hours.    Historical Provider, MD  phentermine 37.5 MG capsule Take 1 capsule (37.5 mg total) by mouth every morning. 02/15/15   Susy Frizzle, MD  rOPINIRole (REQUIP) 2 MG tablet Take 1 tablet (2 mg total) by mouth at bedtime. 07/03/15   Susy Frizzle, MD  tolterodine (DETROL LA) 2 MG 24 hr capsule Take 1 capsule (2 mg total) by mouth daily. 09/10/15   Orlena Sheldon, PA-C   Physical Exam: Filed Vitals:   10/26/15 0952 10/26/15 1115 10/26/15 1130  BP: 129/64 128/56 125/71  Pulse: 87 79 76  Temp: 98 F (36.7 C)    TempSrc: Oral    Resp: 24 21 21   Height: 5\' 4"  (1.626 m)    Weight: 79.379 kg (175 lb)    SpO2: 93% 92% 99%    Wt Readings from Last 3 Encounters:  10/26/15 79.379 kg (175 lb)  09/10/15 80.74 kg (178 lb)  02/15/15 79.379 kg (175 lb)    General:  Appears calm and comfortable, somewhat ill appearing but nontoxic Eyes: PERRL, normal lids, irises & conjunctiva ENT: grossly normal hearing, lips & tongue Neck: no LAD, masses or thyromegaly Cardiovascular: RRR, no m/r/g. No LE edema.  Respiratory:  Normal respiratory effort with conversation. Breath sounds are  quite diminished on right base somewhat diminished right midlobe fair air movement on left diffuse end-expiratory wheezing no crackles Abdomen: soft, ntnd, obese no guarding Skin: no rash or induration seen on limited exam Musculoskeletal: grossly normal tone BUE/BLE Psychiatric: grossly normal mood and affect, speech fluent and appropriate Neurologic: grossly non-focal. Speech clear facial symmetry           Labs on Admission:  Basic Metabolic Panel:  Recent Labs Lab 10/26/15 1052  NA 133*  K 3.7  CL 93*  CO2 25  GLUCOSE 100*  BUN 11  CREATININE 0.76  CALCIUM 8.7*   Liver Function Tests:  Recent Labs Lab 10/26/15 1052  AST 34  ALT 25  ALKPHOS 50  BILITOT 0.2*  PROT 7.1  ALBUMIN 3.3*    Recent Labs Lab 10/26/15 1052  LIPASE 21   No results for input(s): AMMONIA in the last 168 hours. CBC:  Recent Labs Lab 10/26/15 1052  WBC 5.5  HGB 13.2  HCT 41.1  MCV 89.2  PLT 234   Cardiac Enzymes: No results for input(s): CKTOTAL, CKMB, CKMBINDEX, TROPONINI in the last 168 hours.  BNP (last 3 results) No results for input(s): BNP in the last 8760 hours.  ProBNP (last 3 results) No results for input(s): PROBNP in the last 8760 hours.  CBG: No results for input(s): GLUCAP in the last 168 hours.  Radiological Exams on Admission: Dg Chest 2 View  10/26/2015  CLINICAL DATA:  Chest pain EXAM: CHEST  2 VIEW COMPARISON:  02/01/2009 FINDINGS: New right middle lobe airspace disease. Normal heart size. No pneumothorax. No pleural effusion. Left lung clear. IMPRESSION: Right middle lobe consolidation. Followup PA and lateral chest X-ray is recommended in 3-4 weeks following trial of antibiotic therapy to ensure resolution and exclude underlying malignancy. Electronically Signed   By: Marybelle Killings M.D.   On: 10/26/2015 11:19    EKG: Independently reviewed.Normal sinus rhythm Rightward axis Borderline ECG  Assessment/Plan Principal Problem:   Acute respiratory failure  with hypoxia (HCC) Active Problems:   Overweight   TOBACCO ABUSE   Essential hypertension   Coronary atherosclerosis   GERD   CHEST PAIN   Hypertension   Chronic kidney disease   CAP (community acquired pneumonia)  #1. Acute respiratory failure with hypoxia likely related to community-acquired pneumonia in the setting of likely COPD in ongoing smoker. Oxygen saturation level 88% on room air with ambulation. Chest x-ray consistent with pneumonia. No leukocytosis she's afebrile hemodynamically stable and nontoxic appearing. Diffuse wheezing on exam Admit to telemetry -Continue oxygen supplementation -Antibiotics per protocol -Blood culture -Sputum culture -strep pneumo urine antigen -legionella urine antigen -Influenza panel -Nebulizers every 4 hours -solumedrol -Gentle IV fluids -Oxygen trial in a.m.  #2. Community-acquired pneumonia. No leukocytosis afebrile hemodynamically stable. Nontoxic appearing. Chest x-ray shows right middle lobe consolidation. No pleural effusion no pneumothorax. Left side clear. Some concern for malignancy given her long smoking history. Recommend PA and lateral 2-4 weeks -See #1 -recommend f/u xray as concern for malignancy  #3. Chest pain/ CAD. Heart score 4. Initial troponin negative EKG without acute changes. Likely related to GI. History of GERD. Home medications include aspirin, Lipitor -Monitor on telemetry -Cycle troponin -Serial EKG -Continue GI cocktail -Continue home meds  #4. Tobacco abuse. Cessation counseling offered  #5. Hypertension. Controlled in the emergency department. Home medications include diltiazem, losartan -Continue home medications  #6. Diabetes. Serum glucose 100 on admission. Home medications include metformin. Expect CBGs to trend up given steroids -We'll hold metformin for now -Sliding scale insulin for optimal control   Code Status: full DVT Prophylaxis: Family Communication: husband Disposition Plan: home  when ready  Time spent: 72 minutes  [facility]

## 2015-10-26 NOTE — ED Notes (Signed)
Called pharmacy for medications.  

## 2015-10-26 NOTE — ED Notes (Signed)
Cory placed pt. On Oxygen when she arrived to Triage her sats were 74% and she was placed on 2 L and her sats  Increased to 93 -94%

## 2015-10-27 ENCOUNTER — Observation Stay (HOSPITAL_COMMUNITY): Payer: Medicare Other

## 2015-10-27 ENCOUNTER — Encounter (HOSPITAL_COMMUNITY): Payer: Self-pay | Admitting: Radiology

## 2015-10-27 DIAGNOSIS — J9601 Acute respiratory failure with hypoxia: Principal | ICD-10-CM

## 2015-10-27 DIAGNOSIS — Z23 Encounter for immunization: Secondary | ICD-10-CM | POA: Diagnosis not present

## 2015-10-27 DIAGNOSIS — K219 Gastro-esophageal reflux disease without esophagitis: Secondary | ICD-10-CM | POA: Diagnosis present

## 2015-10-27 DIAGNOSIS — I129 Hypertensive chronic kidney disease with stage 1 through stage 4 chronic kidney disease, or unspecified chronic kidney disease: Secondary | ICD-10-CM | POA: Diagnosis present

## 2015-10-27 DIAGNOSIS — R0602 Shortness of breath: Secondary | ICD-10-CM | POA: Diagnosis not present

## 2015-10-27 DIAGNOSIS — N189 Chronic kidney disease, unspecified: Secondary | ICD-10-CM | POA: Diagnosis present

## 2015-10-27 DIAGNOSIS — E1122 Type 2 diabetes mellitus with diabetic chronic kidney disease: Secondary | ICD-10-CM | POA: Diagnosis present

## 2015-10-27 DIAGNOSIS — E663 Overweight: Secondary | ICD-10-CM | POA: Diagnosis present

## 2015-10-27 DIAGNOSIS — J440 Chronic obstructive pulmonary disease with acute lower respiratory infection: Secondary | ICD-10-CM | POA: Diagnosis present

## 2015-10-27 DIAGNOSIS — I2510 Atherosclerotic heart disease of native coronary artery without angina pectoris: Secondary | ICD-10-CM | POA: Diagnosis present

## 2015-10-27 LAB — BASIC METABOLIC PANEL
ANION GAP: 11 (ref 5–15)
BUN: 10 mg/dL (ref 6–20)
CO2: 31 mmol/L (ref 22–32)
Calcium: 8.8 mg/dL — ABNORMAL LOW (ref 8.9–10.3)
Chloride: 92 mmol/L — ABNORMAL LOW (ref 101–111)
Creatinine, Ser: 0.8 mg/dL (ref 0.44–1.00)
GFR calc Af Amer: >60 mL/min (ref >60)
GFR calc non Af Amer: >60 mL/min (ref >60)
GLUCOSE: 213 mg/dL — AB (ref 65–99)
POTASSIUM: 4.4 mmol/L (ref 3.5–5.1)
Sodium: 134 mmol/L — ABNORMAL LOW (ref 135–145)

## 2015-10-27 LAB — LEGIONELLA PNEUMOPHILA SEROGP 1 UR AG: L. pneumophila Serogp 1 Ur Ag: NEGATIVE

## 2015-10-27 LAB — GLUCOSE, CAPILLARY
GLUCOSE-CAPILLARY: 215 mg/dL — AB (ref 65–99)
GLUCOSE-CAPILLARY: 145 mg/dL — AB (ref 65–99)
Glucose-Capillary: 199 mg/dL — ABNORMAL HIGH (ref 65–99)
Glucose-Capillary: 192 mg/dL — ABNORMAL HIGH (ref 65–99)

## 2015-10-27 LAB — CBC
HEMATOCRIT: 39.8 % (ref 36.0–46.0)
HEMOGLOBIN: 12.7 g/dL (ref 12.0–15.0)
MCH: 28.4 pg (ref 26.0–34.0)
MCHC: 31.9 g/dL (ref 30.0–36.0)
MCV: 89 fL (ref 78.0–100.0)
Platelets: 229 10*3/uL (ref 150–400)
RBC: 4.47 MIL/uL (ref 3.87–5.11)
RDW: 14.1 % (ref 11.5–15.5)
WBC: 4.8 10*3/uL (ref 4.0–10.5)

## 2015-10-27 LAB — TROPONIN I: Troponin I: <0.03 ng/mL (ref <0.031)

## 2015-10-27 LAB — HIV ANTIBODY (ROUTINE TESTING W REFLEX): HIV Screen 4th Generation wRfx: NONREACTIVE

## 2015-10-27 MED ORDER — IOHEXOL 350 MG/ML SOLN
80.0000 mL | Freq: Once | INTRAVENOUS | Status: AC | PRN
  Administered 2015-10-27: 100 mL via INTRAVENOUS

## 2015-10-27 NOTE — Progress Notes (Signed)
SATURATION QUALIFICATIONS: (This note is used to comply with regulatory documentation for home oxygen)  Patient Saturations on Room Air at Rest = 91%  Patient Saturations on Room Air while Ambulating =87%  Patient Saturations on RA resting 93% in 21min

## 2015-10-27 NOTE — Care Management Obs Status (Signed)
[place] NOTIFICATION   Patient Details  Name: [name] [name] MRN: [id number] Date of Birth: 09-02-1950   Medicare Observation Status Notification Given:  Yes; Cm explained Code 60 and MOON letter to patient and family. Understanding verbalized. Copy given to patient and original placed in [place] office.     Guido Sander, RN 10/27/2015, 3:52 PM

## 2015-10-27 NOTE — Progress Notes (Signed)
[facility] PROGRESS NOTE  [name] [name] [id number] DOB: Feb 04, 1951 DOA: 10/26/2015 PCP: Odette Fraction, MD  Assessment/Plan: Principal Problem:   Acute respiratory failure with hypoxia ([place]) - Secondary to infectious etiology continue antibiotic regimen -Continue supplemental oxygen as needed will try to wean to room air    CAP (community acquired pneumonia) - We'll continue azithromycin and ceftriaxone - Sputum culture reportedly solely to be collected - Influenza panel negative strep pneumoniae and Legionella urinary antigen negative  Active Problems:   TOBACCO ABUSE - We'll encourage cessation    Essential hypertension - Continue current regimen. Relatively well controlled    Coronary atherosclerosis - asa and statin    GERD - protonix    CHEST PAIN - troponin negative    Chronic kidney disease    Diabetes ([place]) - Carb modified diet - SSI  Code Status: full Family Communication: Discussed with patient and family at bedside Disposition Plan: Barrier to discharge his hypoxia on room air. We'll try to wean off her room air with continued improvement in respiratory condition   Consultants:  None  Procedures:  *None  Antibiotics:  Azithromycin and ceftriaxone  HPI/Subjective: *Patient has no new complaints. Reports that she feels better. Looking for to discharge.  Objective: Filed Vitals:   10/27/15 0943 10/27/15 1311  BP: 134/54 140/46  Pulse: 79 73  Temp:  98.1 F (36.7 C)  Resp:  14    Intake/Output Summary (Last 24 hours) at 10/27/15 1501 Last data filed at 10/27/15 1215  Gross per 24 hour  Intake    300 ml  Output      0 ml  Net    300 ml   Filed Weights   10/26/15 0952 10/26/15 1809 10/27/15 0500  Weight: 79.379 kg (175 lb) 82.4 kg (181 lb 10.5 oz) 81 kg (178 lb 9.2 oz)    Exam:   General:  Patient in no acute distress, alert and awake  Cardiovascular: Regular rate and rhythm, no murmurs or rubs  Respiratory:  Nasal cannula in place, no wheezes, equal chest rise  Abdomen: Soft, nondistended  Musculoskeletal: No cyanosis or clubbing  Data Reviewed: Basic Metabolic Panel:  Recent Labs Lab 10/26/15 1052 10/27/15 0820  NA 133* 134*  K 3.7 4.4  CL 93* 92*  CO2 25 31  GLUCOSE 100* 213*  BUN 11 10  CREATININE 0.76 0.80  CALCIUM 8.7* 8.8*   Liver Function Tests:  Recent Labs Lab 10/26/15 1052  AST 34  ALT 25  ALKPHOS 50  BILITOT 0.2*  PROT 7.1  ALBUMIN 3.3*    Recent Labs Lab 10/26/15 1052  LIPASE 21   No results for input(s): AMMONIA in the last 168 hours. CBC:  Recent Labs Lab 10/26/15 1052 10/27/15 0820  WBC 5.5 4.8  HGB 13.2 12.7  HCT 41.1 39.8  MCV 89.2 89.0  PLT 234 229   Cardiac Enzymes:  Recent Labs Lab 10/26/15 1724 10/26/15 2340  TROPONINI <0.03 <0.03   BNP (last 3 results) No results for input(s): BNP in the last 8760 hours.  ProBNP (last 3 results) No results for input(s): PROBNP in the last 8760 hours.  CBG:  Recent Labs Lab 10/26/15 1730 10/26/15 2126 10/27/15 0802 10/27/15 1222  GLUCAP 246* 257* 199* 215*    No results found for this or any previous visit (from the past 240 hour(s)).   Studies: Dg Chest 2 View  10/26/2015  CLINICAL DATA:  Chest pain EXAM: CHEST  2 VIEW COMPARISON:  02/01/2009 FINDINGS: New right  middle lobe airspace disease. Normal heart size. No pneumothorax. No pleural effusion. Left lung clear. IMPRESSION: Right middle lobe consolidation. Followup PA and lateral chest X-ray is recommended in 3-4 weeks following trial of antibiotic therapy to ensure resolution and exclude underlying malignancy. Electronically Signed   By: Marybelle Killings M.D.   On: 10/26/2015 11:19   Ct Angio Chest Pe W/cm &/or Wo Cm  10/27/2015  CLINICAL DATA:  Acute onset of generalized chest pain and shortness of breath. Initial encounter. EXAM: CT ANGIOGRAPHY CHEST WITH CONTRAST TECHNIQUE: Multidetector CT imaging of the chest was performed  using the standard protocol during bolus administration of intravenous contrast. Multiplanar CT image reconstructions and MIPs were obtained to evaluate the vascular anatomy. CONTRAST:  151mL OMNIPAQUE IOHEXOL 350 MG/ML SOLN COMPARISON:  CTA of the chest performed 12/21/2006, and chest radiograph performed 10/26/2015 FINDINGS: There is no evidence of pulmonary embolus. Minimal bilateral atelectasis is noted. A few small opacities are seen at the upper lung lobes, raising concern for a mild infectious process. Mild peribronchial thickening is noted. There is no evidence of pleural effusion or pneumothorax. No masses are identified; no abnormal focal contrast enhancement is seen. Prominent right hilar nodes measure up to 1.4 cm in short axis. Mildly prominent mediastinal nodes measure up to 1.1 cm in short axis at the azygoesophageal recess. Diffuse coronary artery calcifications are seen. Calcification is noted along the aortic arch and proximal great vessels. No axillary lymphadenopathy is seen. The visualized portions of the thyroid gland are unremarkable in appearance. The visualized portions of the liver and spleen are unremarkable. The patient is status post cholecystectomy, with clips noted at the gallbladder fossa. The visualized portions of the pancreas, kidneys and adrenal glands are within normal limits. No acute osseous abnormalities are seen. Review of the MIP images confirms the above findings. IMPRESSION: 1. No evidence of pulmonary embolus. 2. Few small opacities at the upper lung lobes, raising question for a mild infectious process. 3. Minimal bilateral atelectasis noted. Mild peribronchial thickening seen. 4. Prominent right hilar and mediastinal nodes, measuring up to 1.4 cm in short axis. 5. Diffuse coronary artery calcifications seen. Electronically Signed   By: Garald Balding M.D.   On: 10/27/2015 02:29    Scheduled Meds: . albuterol  2.5 mg Nebulization TID  . aspirin  325 mg Oral Daily   . atorvastatin  20 mg Oral Daily  . azithromycin  500 mg Oral Q24H  . cefTRIAXone (ROCEPHIN)  IV  1 g Intravenous Q24H  . diltiazem  120 mg Oral Daily  . enoxaparin (LOVENOX) injection  40 mg Subcutaneous Q24H  . fesoterodine  4 mg Oral Daily  . fluticasone  1 spray Each Nare Daily  . insulin aspart  0-9 Units Subcutaneous TID WC  . latanoprost  1 drop Both Eyes QHS  . loratadine  10 mg Oral Daily  . losartan  50 mg Oral Daily  . methylPREDNISolone (SOLU-MEDROL) injection  60 mg Intravenous Q12H  . morphine  60 mg Oral Q12H  . pantoprazole  40 mg Oral Daily  . pneumococcal 23 valent vaccine  0.5 mL Intramuscular Tomorrow-1000  . rOPINIRole  2 mg Oral QHS  . sodium chloride flush  3 mL Intravenous Q12H   Continuous Infusions:   Time spent: > 35 minutes  Velvet Bathe  [facility] Pager [phone]. If 7PM-7AM, please contact night-coverage at [email or web address], password [facility] 10/27/2015, 3:01 PM  LOS: 1 day

## 2015-10-27 NOTE — Evaluation (Signed)
Clinical/Bedside Swallow Evaluation Patient Details  Name: [name] [name] MRN: [id number] Date of Birth: 1951-01-25  Today's Date: 10/27/2015 Time: SLP Start Time (ACUTE ONLY): 70 SLP Stop Time (ACUTE ONLY): 1050 SLP Time Calculation (min) (ACUTE ONLY): 10 min  Past Medical History:  Past Medical History  Diagnosis Date  . Duodenitis without mention of hemorrhage   . Diverticulosis of colon (without mention of hemorrhage)   . Hemorrhoids   . Anxiety   . CAD (coronary artery disease)   . Arthritis   . Insomnia   . Tinnitus   . Vertigo   . Diabetes mellitus   . Depression   . GERD (gastroesophageal reflux disease)   . Hypercholesteremia   . Hypertension   . Chronic kidney disease     Renal insufficiency   Past Surgical History:  Past Surgical History  Procedure Laterality Date  . Cholecystectomy    . Angioplasty    . Tubal ligation    . Rotator cuff repair Right 04/2012   HPI:  Pt is a 65 y.o. female with PMH CAD, diabetes, GERD, hypertension, chronic kidney disease, back or use presented to ED chief complaint of chest pain and shortness of breath. Initial evaluation reveals acute respiratory failure with hypoxia chest x-ray concerning for pneumonia with RML consolidation. Bedside swallow eval ordered to r/o aspiration.   Assessment / Plan / Recommendation Clinical Impression  Pt showed no overt s/s of aspiration- swallow subjectively appeared timely. Pt reported no hx of swallowing difficulty. Aspiration risk appears mild at this time. Recommend continuing regular diet, thin liquids, meds whole with liquid, supervision not necessary but ensure pt is seated upright for meals. Reviewed general reflux precautions with pt- sit upright 30 minutes after meal, alternate food/ liquids, small bites/ sips. No SLP f/u recommended at this time; please re-consult if needs arise.    Aspiration Risk  Mild aspiration risk    Diet Recommendation Regular;Thin liquid   Liquid  Administration via: Cup;Straw Medication Administration: Whole meds with liquid Supervision: Patient able to self feed Compensations: Slow rate;Small sips/bites Postural Changes: Seated upright at 90 degrees;Remain upright for at least 30 minutes after po intake    Other  Recommendations Oral Care Recommendations: Oral care BID   Follow up Recommendations  None    Frequency and Duration            Prognosis        Swallow Study   General HPI: Pt is a 65 y.o. female with PMH CAD, diabetes, GERD, hypertension, chronic kidney disease, back or use presented to ED chief complaint of chest pain and shortness of breath. Initial evaluation reveals acute respiratory failure with hypoxia chest x-ray concerning for pneumonia with RML consolidation. Bedside swallow eval ordered to r/o aspiration. Type of Study: Bedside Swallow Evaluation Previous Swallow Assessment: none found Diet Prior to this Study: Regular;Thin liquids Temperature Spikes Noted: Yes (low grade) Respiratory Status: Nasal cannula History of Recent Intubation: No Behavior/Cognition: Alert;Cooperative;Pleasant mood Oral Cavity Assessment: Within Functional Limits Oral Care Completed by SLP: No Oral Cavity - Dentition: Adequate natural dentition Vision: Functional for self-feeding Self-Feeding Abilities: Able to feed self Patient Positioning: Upright in bed Baseline Vocal Quality: Normal Volitional Cough: Strong Volitional Swallow: Able to elicit    Oral/Motor/Sensory Function Overall Oral Motor/Sensory Function: Within functional limits   Ice Chips Ice chips: Not tested   Thin Liquid Thin Liquid: Within functional limits Presentation: Cup;Straw    Nectar Thick Nectar Thick Liquid: Not tested   Honey Thick Honey  Thick Liquid: Not tested   Puree Puree: Within functional limits Presentation: Self Fed;Spoon   Solid   GO   Solid: Within functional limits Presentation: Self Fed    Functional Assessment Tool Used:  clinical judgment Functional Limitations: Swallowing Swallow Current Status [id number]): At least 1 percent but less than 20 percent impaired, limited or restricted Swallow Goal Status [phone]): At least 1 percent but less than 20 percent impaired, limited or restricted Swallow Discharge Status [phone]): At least 1 percent but less than 20 percent impaired, limited or restricted   Gilmore Lar[place], [place], [place], CCC-SLP 10/27/2015,10:56 AM [phone]

## 2015-10-28 DIAGNOSIS — J9601 Acute respiratory failure with hypoxia: Secondary | ICD-10-CM | POA: Diagnosis not present

## 2015-10-28 LAB — GLUCOSE, CAPILLARY
GLUCOSE-CAPILLARY: 145 mg/dL — AB (ref 65–99)
GLUCOSE-CAPILLARY: 176 mg/dL — AB (ref 65–99)

## 2015-10-28 MED ORDER — PREDNISONE 50 MG PO TABS: ORAL_TABLET | ORAL | Status: DC

## 2015-10-28 MED ORDER — CEFDINIR 300 MG PO CAPS: 300.0000 mg | ORAL_CAPSULE | Freq: Two times a day (BID) | ORAL | Status: DC

## 2015-10-28 MED ORDER — AZITHROMYCIN 500 MG PO TABS: 500.0000 mg | ORAL_TABLET | ORAL | Status: DC

## 2015-10-28 NOTE — Progress Notes (Signed)
Reviewed discharge paperwork with pt.  PIV removed.  Pt denied any needs at this time.  Prescriptions given.  Pt taken to discharge location via wheelchair.

## 2015-10-28 NOTE — Discharge Summary (Signed)
Physician Discharge Summary  [name] [name] [id number] DOB: Jun 09, 1951 DOA: 10/26/2015  PCP: Odette Fraction, MD  Admit date: 10/26/2015 Discharge date: 10/28/2015  Time spent: > 35 minutes  Recommendations for Outpatient Follow-up:  1. Monitor blood pressures 2. Encourage tobacco cessation 3. Repeat chest x ray 3-4 weeks after hospital discharge to ensure clearance of infection and exclude other causes for positive findings in hospital   Discharge Diagnoses:  Principal Problem:   Acute respiratory failure with hypoxia ([place]) Active Problems:   Overweight   TOBACCO ABUSE   Essential hypertension   Coronary atherosclerosis   GERD   CHEST PAIN   Hypertension   Chronic kidney disease   CAP (community acquired pneumonia)   Diabetes ([place])   Discharge Condition: stable  Diet recommendation: Heart healthy/carb modified  Filed Weights   10/26/15 1809 10/27/15 0500 10/28/15 0611  Weight: 82.4 kg (181 lb 10.5 oz) 81 kg (178 lb 9.2 oz) 80.5 kg (177 lb 7.5 oz)    History of present illness:  From original HPI: 65 y.o. female with a past medical history that includes CAD, diabetes, GERD, hypertension, chronic kidney disease, back or use presents to emergency Department chief complaint of chest pain and shortness of breath. Initial evaluation reveals acute respiratory failure with hypoxia chest x-ray concerning for pneumonia  Hospital Course:  PNA - Treat with azithromycin and 3rd generation cephalosporin on d/c - improved on this regimen and was able to be weaned off of supplemental oxygen - Will also d/c on short course of prednisone  For known medical conditions listed above will continue medication regimen listed below.  Procedures:  None  Consultations:  None  Discharge Exam: Filed Vitals:   10/27/15 2121 10/28/15 0611  BP: 144/59 164/69  Pulse: 78 89  Temp: 97.7 F (36.5 C) 98 F (36.7 C)  Resp: 19 18    General: Pt in nad, alert and  awake Cardiovascular: rrr, no mrg Respiratory: equal chest rise, speaking in full sentences off of supplemental oxygen, no rhales  Discharge Instructions   Discharge Instructions    Call MD for:  difficulty breathing, headache or visual disturbances    Complete by:  As directed      Call MD for:  temperature >100.4    Complete by:  As directed      Diet - low sodium heart healthy    Complete by:  As directed      Discharge instructions    Complete by:  As directed   Please avoid any tobacco use  Follow up with your primary care physician in 1-2 weeks or sooner.     Increase activity slowly    Complete by:  As directed           Current Discharge Medication List    START taking these medications   Details  azithromycin (ZITHROMAX) 500 MG tablet Take 1 tablet (500 mg total) by mouth daily. Qty: 4 tablet, Refills: 0    cefdinir (OMNICEF) 300 MG capsule Take 1 capsule (300 mg total) by mouth 2 (two) times daily. Qty: 12 capsule, Refills: 0    predniSONE (DELTASONE) 50 MG tablet Take 1 tablet orally daily Qty: 4 tablet, Refills: 0      CONTINUE these medications which have NOT CHANGED   Details  albuterol (PROVENTIL HFA;VENTOLIN HFA) 108 (90 BASE) MCG/ACT inhaler Inhale 2 puffs into the lungs every 4 (four) hours as needed for wheezing or shortness of breath. Qty: 1 Inhaler, Refills: 2    aspirin  325 MG tablet Take 325 mg by mouth daily.    atorvastatin (LIPITOR) 20 MG tablet Take 1 tablet (20 mg total) by mouth daily. Qty: 90 tablet, Refills: 3    dexlansoprazole (DEXILANT) 60 MG capsule Take 1 capsule (60 mg total) by mouth daily. Qty: 90 capsule, Refills: 3    diltiazem (CARDIZEM) 120 MG tablet Take 1 tablet (120 mg total) by mouth daily. Qty: 30 tablet, Refills: 3    fexofenadine (ALLEGRA) 180 MG tablet Take 1 tablet (180 mg total) by mouth daily. Qty: 90 tablet, Refills: 3    glucose blood (FREESTYLE LITE) test strip Pt checks BS bid - also needs lancets disp:  1 box with prn refills Qty: 100 each, Refills: 11    HYDROcodone-acetaminophen (NORCO) 10-325 MG per tablet Take 1 tablet by mouth every 6 (six) hours as needed for moderate pain.     latanoprost (XALATAN) 0.005 % ophthalmic solution INSTILL 1 DROP INTO EACH EYE AT BEDTIME Refills: 4    losartan (COZAAR) 50 MG tablet Take 1 tablet (50 mg total) by mouth daily. Qty: 90 tablet, Refills: 3    metFORMIN (GLUCOPHAGE) 1000 MG tablet Take 1 tablet (1,000 mg total) by mouth 2 (two) times daily with a meal. Qty: 180 tablet, Refills: 3    mometasone (NASONEX) 50 MCG/ACT nasal spray Place 2 sprays into the nose daily. Please dispense 3 bottles for a 3 month supply Qty: 51 g, Refills: 3    oxymorphone (OPANA ER) 20 MG 12 hr tablet Take 20 mg by mouth every 12 (twelve) hours.    rOPINIRole (REQUIP) 2 MG tablet Take 1 tablet (2 mg total) by mouth at bedtime. Qty: 90 tablet, Refills: 3    tolterodine (DETROL LA) 2 MG 24 hr capsule Take 1 capsule (2 mg total) by mouth daily. Qty: 30 capsule, Refills: 3   Associated Diagnoses: Overactive bladder      STOP taking these medications     phentermine 37.5 MG capsule        Allergies  Allergen Reactions  . Guaifenesin & Derivatives     Severe Reaction  . Benazepril Other (See Comments)    cough  . Latex     REACTION: powder on gloves  . Morphine     REACTION: nausea  . Zyrtec [Cetirizine] Other (See Comments)    swelling      The results of significant diagnostics from this hospitalization (including imaging, microbiology, ancillary and laboratory) are listed below for reference.    Significant Diagnostic Studies: Dg Chest 2 View  10/26/2015  CLINICAL DATA:  Chest pain EXAM: CHEST  2 VIEW COMPARISON:  02/01/2009 FINDINGS: New right middle lobe airspace disease. Normal heart size. No pneumothorax. No pleural effusion. Left lung clear. IMPRESSION: Right middle lobe consolidation. Followup PA and lateral chest X-ray is recommended in 3-4  weeks following trial of antibiotic therapy to ensure resolution and exclude underlying malignancy. Electronically Signed   By: Marybelle Killings M.D.   On: 10/26/2015 11:19   Ct Angio Chest Pe W/cm &/or Wo Cm  10/27/2015  CLINICAL DATA:  Acute onset of generalized chest pain and shortness of breath. Initial encounter. EXAM: CT ANGIOGRAPHY CHEST WITH CONTRAST TECHNIQUE: Multidetector CT imaging of the chest was performed using the standard protocol during bolus administration of intravenous contrast. Multiplanar CT image reconstructions and MIPs were obtained to evaluate the vascular anatomy. CONTRAST:  170mL OMNIPAQUE IOHEXOL 350 MG/ML SOLN COMPARISON:  CTA of the chest performed 12/21/2006, and chest radiograph performed  10/26/2015 FINDINGS: There is no evidence of pulmonary embolus. Minimal bilateral atelectasis is noted. A few small opacities are seen at the upper lung lobes, raising concern for a mild infectious process. Mild peribronchial thickening is noted. There is no evidence of pleural effusion or pneumothorax. No masses are identified; no abnormal focal contrast enhancement is seen. Prominent right hilar nodes measure up to 1.4 cm in short axis. Mildly prominent mediastinal nodes measure up to 1.1 cm in short axis at the azygoesophageal recess. Diffuse coronary artery calcifications are seen. Calcification is noted along the aortic arch and proximal great vessels. No axillary lymphadenopathy is seen. The visualized portions of the thyroid gland are unremarkable in appearance. The visualized portions of the liver and spleen are unremarkable. The patient is status post cholecystectomy, with clips noted at the gallbladder fossa. The visualized portions of the pancreas, kidneys and adrenal glands are within normal limits. No acute osseous abnormalities are seen. Review of the MIP images confirms the above findings. IMPRESSION: 1. No evidence of pulmonary embolus. 2. Few small opacities at the upper lung lobes,  raising question for a mild infectious process. 3. Minimal bilateral atelectasis noted. Mild peribronchial thickening seen. 4. Prominent right hilar and mediastinal nodes, measuring up to 1.4 cm in short axis. 5. Diffuse coronary artery calcifications seen. Electronically Signed   By: Garald Balding M.D.   On: 10/27/2015 02:29    Microbiology: Recent Results (from the past 240 hour(s))  Culture, blood (routine x 2) Call MD if unable to obtain prior to antibiotics being given     Status: None (Preliminary result)   Collection Time: 10/26/15  4:11 PM  Result Value Ref Range Status   Specimen Description BLOOD LEFT ANTECUBITAL  Final   Special Requests BOTTLES DRAWN AEROBIC AND ANAEROBIC 5CC  Final   Culture NO GROWTH < 24 HOURS  Final   Report Status PENDING  Incomplete  Culture, blood (routine x 2) Call MD if unable to obtain prior to antibiotics being given     Status: None (Preliminary result)   Collection Time: 10/26/15  4:16 PM  Result Value Ref Range Status   Specimen Description BLOOD RIGHT HAND  Final   Special Requests BOTTLES DRAWN AEROBIC AND ANAEROBIC 5CC  Final   Culture NO GROWTH < 24 HOURS  Final   Report Status PENDING  Incomplete     Labs: Basic Metabolic Panel:  Recent Labs Lab 10/26/15 1052 10/27/15 0820  NA 133* 134*  K 3.7 4.4  CL 93* 92*  CO2 25 31  GLUCOSE 100* 213*  BUN 11 10  CREATININE 0.76 0.80  CALCIUM 8.7* 8.8*   Liver Function Tests:  Recent Labs Lab 10/26/15 1052  AST 34  ALT 25  ALKPHOS 50  BILITOT 0.2*  PROT 7.1  ALBUMIN 3.3*    Recent Labs Lab 10/26/15 1052  LIPASE 21   No results for input(s): AMMONIA in the last 168 hours. CBC:  Recent Labs Lab 10/26/15 1052 10/27/15 0820  WBC 5.5 4.8  HGB 13.2 12.7  HCT 41.1 39.8  MCV 89.2 89.0  PLT 234 229   Cardiac Enzymes:  Recent Labs Lab 10/26/15 1724 10/26/15 2340  TROPONINI <0.03 <0.03   BNP: BNP (last 3 results) No results for input(s): BNP in the last 8760  hours.  ProBNP (last 3 results) No results for input(s): PROBNP in the last 8760 hours.  CBG:  Recent Labs Lab 10/27/15 0802 10/27/15 1222 10/27/15 1738 10/27/15 2120 10/28/15 0748  GLUCAP 199*  215* 145* 192* 145*    Signed:  Velvet Bathe MD.  [facility] 10/28/2015, 10:04 AM

## 2015-10-30 ENCOUNTER — Encounter: Payer: Self-pay | Admitting: Family Medicine

## 2015-10-30 ENCOUNTER — Ambulatory Visit (INDEPENDENT_AMBULATORY_CARE_PROVIDER_SITE_OTHER): Payer: Medicare Other | Admitting: Family Medicine

## 2015-10-30 VITALS — BP 150/68 | HR 80 | Temp 97.5°F | Resp 22 | Ht 64.0 in | Wt 177.0 lb

## 2015-10-30 DIAGNOSIS — J189 Pneumonia, unspecified organism: Secondary | ICD-10-CM

## 2015-10-30 DIAGNOSIS — Z09 Encounter for follow-up examination after completed treatment for conditions other than malignant neoplasm: Secondary | ICD-10-CM | POA: Diagnosis not present

## 2015-10-30 MED ORDER — IPRATROPIUM-ALBUTEROL 0.5-2.5 (3) MG/3ML IN SOLN: 3.0000 mL | Freq: Four times a day (QID) | RESPIRATORY_TRACT | Status: DC | PRN

## 2015-10-30 NOTE — Progress Notes (Signed)
Subjective:    Patient ID: [name] [name], female    DOB: 01/26/1951, 65 y.o.   MRN: [id number]  HPI  Admitted to the hospital with community-acquired pneumonia. Was discharged from the hospital March 19. Chest x-ray revealed:  Right middle lobe consolidation. Followup PA and lateral chest X-ray is recommended in 3-4 weeks following trial of antibiotic therapy to ensure resolution and exclude underlying malignancy.  Was discharged on azithromycin 500 mg by mouth daily for 4 days and Omnicef 300 mg by mouth twice a day for 6 days.  She was also given prednisone 50 mg by mouth daily for an additional 4 days given her respiratory failure there was due in part to her commute quite pneumonia and part 2 reactive airway disease and possibly underlying COPD.  Today in the clinic, the patient's oxygen saturation is 88% on room air. With ambulation it drops to 87.  She is extremely short of breath. She has expiratory wheezes bilaterally along with rails particular prominent in the right lower lobe posteriorly. Patient does not feel any better. She does not have a nebulizer at home. She reports dyspnea on exertion. Her cough is nonproductive which I feel is due to the fact she is experiencing bronchospasms and is unable to generate air pressure to expel the mucus Past Medical History  Diagnosis Date  . Duodenitis without mention of hemorrhage   . Diverticulosis of colon (without mention of hemorrhage)   . Hemorrhoids   . Anxiety   . CAD (coronary artery disease)   . Arthritis   . Insomnia   . Tinnitus   . Vertigo   . Diabetes mellitus   . Depression   . GERD (gastroesophageal reflux disease)   . Hypercholesteremia   . Hypertension   . Chronic kidney disease     Renal insufficiency   Past Surgical History  Procedure Laterality Date  . Cholecystectomy    . Angioplasty    . Tubal ligation    . Rotator cuff repair Right 04/2012   Current Outpatient Prescriptions on File Prior to Visit    Medication Sig Dispense Refill  . albuterol (PROVENTIL HFA;VENTOLIN HFA) 108 (90 BASE) MCG/ACT inhaler Inhale 2 puffs into the lungs every 4 (four) hours as needed for wheezing or shortness of breath. 1 Inhaler 2  . aspirin 325 MG tablet Take 325 mg by mouth daily.    Marland Kitchen atorvastatin (LIPITOR) 20 MG tablet Take 1 tablet (20 mg total) by mouth daily. 90 tablet 3  . azithromycin (ZITHROMAX) 500 MG tablet Take 1 tablet (500 mg total) by mouth daily. 4 tablet 0  . cefdinir (OMNICEF) 300 MG capsule Take 1 capsule (300 mg total) by mouth 2 (two) times daily. 12 capsule 0  . dexlansoprazole (DEXILANT) 60 MG capsule Take 1 capsule (60 mg total) by mouth daily. 90 capsule 3  . diltiazem (CARDIZEM) 120 MG tablet Take 1 tablet (120 mg total) by mouth daily. 30 tablet 3  . fexofenadine (ALLEGRA) 180 MG tablet Take 1 tablet (180 mg total) by mouth daily. 90 tablet 3  . glucose blood (FREESTYLE LITE) test strip Pt checks BS bid - also needs lancets disp: 1 box with prn refills 100 each 11  . HYDROcodone-acetaminophen (NORCO) 10-325 MG per tablet Take 1 tablet by mouth every 6 (six) hours as needed for moderate pain.     Marland Kitchen latanoprost (XALATAN) 0.005 % ophthalmic solution INSTILL 1 DROP INTO EACH EYE AT BEDTIME  4  . losartan (COZAAR) 50 MG tablet  Take 1 tablet (50 mg total) by mouth daily. 90 tablet 3  . metFORMIN (GLUCOPHAGE) 1000 MG tablet Take 1 tablet (1,000 mg total) by mouth 2 (two) times daily with a meal. 180 tablet 3  . mometasone (NASONEX) 50 MCG/ACT nasal spray Place 2 sprays into the nose daily. Please dispense 3 bottles for a 3 month supply (Patient taking differently: Place 2 sprays into the nose daily as needed (allergies). ) 51 g 3  . oxymorphone (OPANA ER) 20 MG 12 hr tablet Take 20 mg by mouth every 12 (twelve) hours.    . predniSONE (DELTASONE) 50 MG tablet Take 1 tablet orally daily 4 tablet 0  . rOPINIRole (REQUIP) 2 MG tablet Take 1 tablet (2 mg total) by mouth at bedtime. (Patient taking  differently: Take 1-2 mg by mouth at bedtime. ) 90 tablet 3  . tolterodine (DETROL LA) 2 MG 24 hr capsule Take 1 capsule (2 mg total) by mouth daily. 30 capsule 3   No current facility-administered medications on file prior to visit.   Allergies  Allergen Reactions  . Guaifenesin & Derivatives     Severe Reaction  . Benazepril Other (See Comments)    cough  . Latex     REACTION: powder on gloves  . Morphine     REACTION: nausea  . Zyrtec [Cetirizine] Other (See Comments)    swelling   Social History   Social History  . Marital Status: Married    Spouse Name: N/A  . Number of Children: 1  . Years of Education: N/A   Occupational History  . Retired    Social History Main Topics  . Smoking status: Current Every Day Smoker    Types: Cigarettes    Last Attempt to Quit: 09/11/2008  . Smokeless tobacco: Never Used  . Alcohol Use: No  . Drug Use: No  . Sexual Activity: Not on file   Other Topics Concern  . Not on file   Social History Narrative     Review of Systems  All other systems reviewed and are negative.      Objective:   Physical Exam  Constitutional: She appears well-developed and well-nourished. She has a sickly appearance.  Neck: No JVD present.  Cardiovascular: Normal rate, regular rhythm and normal heart sounds.   Pulmonary/Chest: Effort normal. No respiratory distress. She has wheezes. She has rales.  Abdominal: Soft. Bowel sounds are normal.  Musculoskeletal: She exhibits no edema.  Vitals reviewed.         Assessment & Plan:  CAP (community acquired pneumonia)  Hospital discharge follow-up  CAP (community acquired pneumonia) - Plan: DG Chest 2 View, ipratropium-albuterol (DUONEB) 0.5-2.5 (3) MG/3ML [facility] discharge follow-up - Plan: DG Chest 2 View, ipratropium-albuterol (DUONEB) 0.5-2.5 (3) MG/3ML SOLN  Patient is no better from when she left the hospital. She is also hypoxic on room air. She needs oxygen 2 L via nasal  cannula. On 2 L via nasal cannula, her oxygen is 95%. Off oxygen, her oxygen saturations are 88%. We will arrange this tonight from a home health agency. She also would benefit from given nebs 1 inhaled every 6 hours to treat the wheezing and bronchospasm. I will continue her on Omnicef and Zithromax and recheck in 48 hours. If her symptoms get worse she is to return to the hospital immediately

## 2015-10-31 LAB — CULTURE, BLOOD (ROUTINE X 2)
CULTURE: NO GROWTH
CULTURE: NO GROWTH

## 2015-11-01 ENCOUNTER — Ambulatory Visit (INDEPENDENT_AMBULATORY_CARE_PROVIDER_SITE_OTHER): Payer: Medicare Other | Admitting: Family Medicine

## 2015-11-01 ENCOUNTER — Encounter: Payer: Self-pay | Admitting: Family Medicine

## 2015-11-01 VITALS — BP 150/60 | HR 86 | Temp 98.0°F | Resp 18 | Ht 64.0 in | Wt 180.0 lb

## 2015-11-01 DIAGNOSIS — J189 Pneumonia, unspecified organism: Secondary | ICD-10-CM

## 2015-11-01 NOTE — Progress Notes (Signed)
Subjective:    Patient ID: [name] [name], female    DOB: 1950/08/16, 65 y.o.   MRN: [id number]  HPI  10/30/15 Admitted to the hospital with community-acquired pneumonia. Was discharged from the hospital March 19. Chest x-ray revealed:  Right middle lobe consolidation. Followup PA and lateral chest X-ray is recommended in 3-4 weeks following trial of antibiotic therapy to ensure resolution and exclude underlying malignancy.  Was discharged on azithromycin 500 mg by mouth daily for 4 days and Omnicef 300 mg by mouth twice a day for 6 days.  She was also given prednisone 50 mg by mouth daily for an additional 4 days given her respiratory failure there was due in part to her commute quite pneumonia and part 2 reactive airway disease and possibly underlying COPD.  Today in the clinic, the patient's oxygen saturation is 88% on room air. With ambulation it drops to 87.  She is extremely short of breath. She has expiratory wheezes bilaterally along with rails particular prominent in the right lower lobe posteriorly. Patient does not feel any better. She does not have a nebulizer at home. She reports dyspnea on exertion. Her cough is nonproductive which I feel is due to the fact she is experiencing bronchospasms and is unable to generate air pressure to expel the mucus.  At that time, my plan was: Patient is no better from when she left the hospital. She is also hypoxic on room air. She needs oxygen 2 L via nasal cannula. On 2 L via nasal cannula, her oxygen is 95%. Off oxygen, her oxygen saturations are 88%. We will arrange this tonight from a home health agency. She also would benefit from given nebs 1 inhaled every 6 hours to treat the wheezing and bronchospasm. I will continue her on Omnicef and Zithromax and recheck in 48 hours. If her symptoms get worse she is to return to the hospital immediately  11/01/15 Patient is doing much better today. Her oxygen saturation is 97% on 2 L. Off oxygen it drops  to 94% but she feels much better. She is using DuoNeb's every 8 hours and her breathing has improved dramatically. She still has some mild congestion in her right base and some faint expiratory wheezes but her lungs sound much improved compared to 2 days ago Past Medical History  Diagnosis Date  . Duodenitis without mention of hemorrhage   . Diverticulosis of colon (without mention of hemorrhage)   . Hemorrhoids   . Anxiety   . CAD (coronary artery disease)   . Arthritis   . Insomnia   . Tinnitus   . Vertigo   . Diabetes mellitus   . Depression   . GERD (gastroesophageal reflux disease)   . Hypercholesteremia   . Hypertension   . Chronic kidney disease     Renal insufficiency   Past Surgical History  Procedure Laterality Date  . Cholecystectomy    . Angioplasty    . Tubal ligation    . Rotator cuff repair Right 04/2012   Current Outpatient Prescriptions on File Prior to Visit  Medication Sig Dispense Refill  . albuterol (PROVENTIL HFA;VENTOLIN HFA) 108 (90 BASE) MCG/ACT inhaler Inhale 2 puffs into the lungs every 4 (four) hours as needed for wheezing or shortness of breath. 1 Inhaler 2  . aspirin 325 MG tablet Take 325 mg by mouth daily.    Marland Kitchen atorvastatin (LIPITOR) 20 MG tablet Take 1 tablet (20 mg total) by mouth daily. 90 tablet 3  . azithromycin (ZITHROMAX)  500 MG tablet Take 1 tablet (500 mg total) by mouth daily. 4 tablet 0  . cefdinir (OMNICEF) 300 MG capsule Take 1 capsule (300 mg total) by mouth 2 (two) times daily. 12 capsule 0  . dexlansoprazole (DEXILANT) 60 MG capsule Take 1 capsule (60 mg total) by mouth daily. 90 capsule 3  . diltiazem (CARDIZEM) 120 MG tablet Take 1 tablet (120 mg total) by mouth daily. 30 tablet 3  . fexofenadine (ALLEGRA) 180 MG tablet Take 1 tablet (180 mg total) by mouth daily. 90 tablet 3  . glucose blood (FREESTYLE LITE) test strip Pt checks BS bid - also needs lancets disp: 1 box with prn refills 100 each 11  . HYDROcodone-acetaminophen  (NORCO) 10-325 MG per tablet Take 1 tablet by mouth every 6 (six) hours as needed for moderate pain.     Marland Kitchen ipratropium-albuterol (DUONEB) 0.5-2.5 (3) MG/3ML SOLN Take 3 mLs by nebulization every 6 (six) hours as needed. 360 mL 0  . latanoprost (XALATAN) 0.005 % ophthalmic solution INSTILL 1 DROP INTO EACH EYE AT BEDTIME  4  . losartan (COZAAR) 50 MG tablet Take 1 tablet (50 mg total) by mouth daily. 90 tablet 3  . metFORMIN (GLUCOPHAGE) 1000 MG tablet Take 1 tablet (1,000 mg total) by mouth 2 (two) times daily with a meal. 180 tablet 3  . mometasone (NASONEX) 50 MCG/ACT nasal spray Place 2 sprays into the nose daily. Please dispense 3 bottles for a 3 month supply (Patient taking differently: Place 2 sprays into the nose daily as needed (allergies). ) 51 g 3  . oxymorphone (OPANA ER) 20 MG 12 hr tablet Take 20 mg by mouth every 12 (twelve) hours.    . predniSONE (DELTASONE) 50 MG tablet Take 1 tablet orally daily 4 tablet 0  . rOPINIRole (REQUIP) 2 MG tablet Take 1 tablet (2 mg total) by mouth at bedtime. (Patient taking differently: Take 1-2 mg by mouth at bedtime. ) 90 tablet 3  . tolterodine (DETROL LA) 2 MG 24 hr capsule Take 1 capsule (2 mg total) by mouth daily. 30 capsule 3   No current facility-administered medications on file prior to visit.   Allergies  Allergen Reactions  . Guaifenesin & Derivatives     Severe Reaction  . Benazepril Other (See Comments)    cough  . Latex     REACTION: powder on gloves  . Morphine     REACTION: nausea  . Zyrtec [Cetirizine] Other (See Comments)    swelling   Social History   Social History  . Marital Status: Married    Spouse Name: N/A  . Number of Children: 1  . Years of Education: N/A   Occupational History  . Retired    Social History Main Topics  . Smoking status: Current Every Day Smoker    Types: Cigarettes    Last Attempt to Quit: 09/11/2008  . Smokeless tobacco: Never Used  . Alcohol Use: No  . Drug Use: No  . Sexual  Activity: Not on file   Other Topics Concern  . Not on file   Social History Narrative     Review of Systems  All other systems reviewed and are negative.      Objective:   Physical Exam  Constitutional: She appears well-developed and well-nourished.  Neck: No JVD present.  Cardiovascular: Normal rate, regular rhythm and normal heart sounds.   Pulmonary/Chest: Effort normal. No respiratory distress. She has no wheezes. She has no rales.  Abdominal: Soft. Bowel  sounds are normal.  Musculoskeletal: She exhibits no edema.  Vitals reviewed.         Assessment & Plan:  CAP (community acquired pneumonia)  Patient looks substantially better compared to 2 days ago. I will recheck the patient next week. Hopefully at that time we can discontinue oxygen altogether. Asked her to wean back on the DuoNeb nebs to every 6 hours as needed rather than every 8 hours scheduled

## 2015-11-08 ENCOUNTER — Encounter: Payer: Self-pay | Admitting: Family Medicine

## 2015-11-08 ENCOUNTER — Ambulatory Visit (INDEPENDENT_AMBULATORY_CARE_PROVIDER_SITE_OTHER): Payer: Medicare Other | Admitting: Family Medicine

## 2015-11-08 VITALS — BP 100/56 | HR 94 | Temp 97.9°F | Resp 18 | Ht 64.0 in | Wt 176.0 lb

## 2015-11-08 DIAGNOSIS — J189 Pneumonia, unspecified organism: Secondary | ICD-10-CM

## 2015-11-08 MED ORDER — LEVOFLOXACIN 750 MG PO TABS: 750.0000 mg | ORAL_TABLET | Freq: Every day | ORAL | Status: DC

## 2015-11-08 NOTE — Progress Notes (Signed)
Subjective:    Patient ID: [name] [name], female    DOB: 1951-03-16, 65 y.o.   MRN: [id number]  HPI  10/30/15 Admitted to the hospital with community-acquired pneumonia. Was discharged from the hospital March 19. Chest x-ray revealed:  Right middle lobe consolidation. Followup PA and lateral chest X-ray is recommended in 3-4 weeks following trial of antibiotic therapy to ensure resolution and exclude underlying malignancy.  Was discharged on azithromycin 500 mg by mouth daily for 4 days and Omnicef 300 mg by mouth twice a day for 6 days.  She was also given prednisone 50 mg by mouth daily for an additional 4 days given her respiratory failure there was due in part to her commute quite pneumonia and part 2 reactive airway disease and possibly underlying COPD.  Today in the clinic, the patient's oxygen saturation is 88% on room air. With ambulation it drops to 87.  She is extremely short of breath. She has expiratory wheezes bilaterally along with rails particular prominent in the right lower lobe posteriorly. Patient does not feel any better. She does not have a nebulizer at home. She reports dyspnea on exertion. Her cough is nonproductive which I feel is due to the fact she is experiencing bronchospasms and is unable to generate air pressure to expel the mucus.  At that time, my plan was: Patient is no better from when she left the hospital. She is also hypoxic on room air. She needs oxygen 2 L via nasal cannula. On 2 L via nasal cannula, her oxygen is 95%. Off oxygen, her oxygen saturations are 88%. We will arrange this tonight from a home health agency. She also would benefit from given nebs 1 inhaled every 6 hours to treat the wheezing and bronchospasm. I will continue her on Omnicef and Zithromax and recheck in 48 hours. If her symptoms get worse she is to return to the hospital immediately  11/01/15 Patient is doing much better today. Her oxygen saturation is 97% on 2 L. Off oxygen it drops  to 94% but she feels much better. She is using DuoNeb's every 8 hours and her breathing has improved dramatically. She still has some mild congestion in her right base and some faint expiratory wheezes but her lungs sound much improved compared to 2 days ago.  At that time, my plan was: Patient looks substantially better compared to 2 days ago. I will recheck the patient next week. Hopefully at that time we can discontinue oxygen altogether. Asked her to wean back on the DuoNeb nebs to every 6 hours as needed rather than every 8 hours scheduled   11/08/15 Patient has taken a turn for the worse. Her breathing has worsened. She is more short of breath. She reports more fatigue. She is not wearing her oxygen today and she is 88% on room air. On examination she is moving good air. There is no expiratory wheezes. Unfortunately she has pronounced right basilar and left basilar crackles and rails therefore not previously appreciated. Past Medical History  Diagnosis Date  . Duodenitis without mention of hemorrhage   . Diverticulosis of colon (without mention of hemorrhage)   . Hemorrhoids   . Anxiety   . CAD (coronary artery disease)   . Arthritis   . Insomnia   . Tinnitus   . Vertigo   . Diabetes mellitus   . Depression   . GERD (gastroesophageal reflux disease)   . Hypercholesteremia   . Hypertension   . Chronic kidney disease  Renal insufficiency   Past Surgical History  Procedure Laterality Date  . Cholecystectomy    . Angioplasty    . Tubal ligation    . Rotator cuff repair Right 04/2012   Current Outpatient Prescriptions on File Prior to Visit  Medication Sig Dispense Refill  . albuterol (PROVENTIL HFA;VENTOLIN HFA) 108 (90 BASE) MCG/ACT inhaler Inhale 2 puffs into the lungs every 4 (four) hours as needed for wheezing or shortness of breath. 1 Inhaler 2  . aspirin 325 MG tablet Take 325 mg by mouth daily.    Marland Kitchen atorvastatin (LIPITOR) 20 MG tablet Take 1 tablet (20 mg total) by  mouth daily. 90 tablet 3  . dexlansoprazole (DEXILANT) 60 MG capsule Take 1 capsule (60 mg total) by mouth daily. 90 capsule 3  . diltiazem (CARDIZEM) 120 MG tablet Take 1 tablet (120 mg total) by mouth daily. 30 tablet 3  . fexofenadine (ALLEGRA) 180 MG tablet Take 1 tablet (180 mg total) by mouth daily. 90 tablet 3  . glucose blood (FREESTYLE LITE) test strip Pt checks BS bid - also needs lancets disp: 1 box with prn refills 100 each 11  . HYDROcodone-acetaminophen (NORCO) 10-325 MG per tablet Take 1 tablet by mouth every 6 (six) hours as needed for moderate pain.     Marland Kitchen ipratropium-albuterol (DUONEB) 0.5-2.5 (3) MG/3ML SOLN Take 3 mLs by nebulization every 6 (six) hours as needed. 360 mL 0  . latanoprost (XALATAN) 0.005 % ophthalmic solution INSTILL 1 DROP INTO EACH EYE AT BEDTIME  4  . losartan (COZAAR) 50 MG tablet Take 1 tablet (50 mg total) by mouth daily. 90 tablet 3  . metFORMIN (GLUCOPHAGE) 1000 MG tablet Take 1 tablet (1,000 mg total) by mouth 2 (two) times daily with a meal. 180 tablet 3  . mometasone (NASONEX) 50 MCG/ACT nasal spray Place 2 sprays into the nose daily. Please dispense 3 bottles for a 3 month supply (Patient taking differently: Place 2 sprays into the nose daily as needed (allergies). ) 51 g 3  . oxymorphone (OPANA ER) 20 MG 12 hr tablet Take 20 mg by mouth every 12 (twelve) hours.    . predniSONE (DELTASONE) 50 MG tablet Take 1 tablet orally daily 4 tablet 0  . rOPINIRole (REQUIP) 2 MG tablet Take 1 tablet (2 mg total) by mouth at bedtime. (Patient taking differently: Take 1-2 mg by mouth at bedtime. ) 90 tablet 3  . tolterodine (DETROL LA) 2 MG 24 hr capsule Take 1 capsule (2 mg total) by mouth daily. 30 capsule 3   No current facility-administered medications on file prior to visit.   Allergies  Allergen Reactions  . Guaifenesin & Derivatives     Severe Reaction  . Benazepril Other (See Comments)    cough  . Latex     REACTION: powder on gloves  . Morphine      REACTION: nausea  . Zyrtec [Cetirizine] Other (See Comments)    swelling   Social History   Social History  . Marital Status: Married    Spouse Name: N/A  . Number of Children: 1  . Years of Education: N/A   Occupational History  . Retired    Social History Main Topics  . Smoking status: Current Every Day Smoker    Types: Cigarettes    Last Attempt to Quit: 09/11/2008  . Smokeless tobacco: Never Used  . Alcohol Use: No  . Drug Use: No  . Sexual Activity: Not on file   Other Topics Concern  .  Not on file   Social History Narrative     Review of Systems  All other systems reviewed and are negative.      Objective:   Physical Exam  Constitutional: She appears well-developed and well-nourished.  Neck: No JVD present.  Cardiovascular: Normal rate, regular rhythm and normal heart sounds.   Pulmonary/Chest: Effort normal. No respiratory distress. She has no wheezes. She has rales.  Abdominal: Soft. Bowel sounds are normal.  Musculoskeletal: She exhibits no edema.  Vitals reviewed.         Assessment & Plan:  CAP (community acquired pneumonia) - Plan: levofloxacin (LEVAQUIN) 750 MG tablet  Begin Levaquin 750 mg by mouth daily immediately. Recheck next week. Begin wearing oxygen 24/7. If symptoms worsen return to the hospital. She is actually lost 4 pounds since her last office visit and there is no pitting edema and therefore I do not believe this is any sign of fluid overload.

## 2015-11-15 ENCOUNTER — Ambulatory Visit (INDEPENDENT_AMBULATORY_CARE_PROVIDER_SITE_OTHER): Payer: Medicare Other | Admitting: Family Medicine

## 2015-11-15 ENCOUNTER — Encounter: Payer: Self-pay | Admitting: Family Medicine

## 2015-11-15 VITALS — BP 110/62 | HR 100 | Temp 97.9°F | Resp 22 | Ht 64.0 in | Wt 174.0 lb

## 2015-11-15 DIAGNOSIS — J189 Pneumonia, unspecified organism: Secondary | ICD-10-CM

## 2015-11-15 NOTE — Progress Notes (Signed)
Subjective:    Patient ID: [name] [name], female    DOB: 1950-10-19, 65 y.o.   MRN: [id number]  HPI  10/30/15 Admitted to the hospital with community-acquired pneumonia. Was discharged from the hospital March 19. Chest x-ray revealed:  Right middle lobe consolidation. Followup PA and lateral chest X-ray is recommended in 3-4 weeks following trial of antibiotic therapy to ensure resolution and exclude underlying malignancy.  Was discharged on azithromycin 500 mg by mouth daily for 4 days and Omnicef 300 mg by mouth twice a day for 6 days.  She was also given prednisone 50 mg by mouth daily for an additional 4 days given her respiratory failure there was due in part to her commute quite pneumonia and part 2 reactive airway disease and possibly underlying COPD.  Today in the clinic, the patient's oxygen saturation is 88% on room air. With ambulation it drops to 87.  She is extremely short of breath. She has expiratory wheezes bilaterally along with rails particular prominent in the right lower lobe posteriorly. Patient does not feel any better. She does not have a nebulizer at home. She reports dyspnea on exertion. Her cough is nonproductive which I feel is due to the fact she is experiencing bronchospasms and is unable to generate air pressure to expel the mucus.  At that time, my plan was: Patient is no better from when she left the hospital. She is also hypoxic on room air. She needs oxygen 2 L via nasal cannula. On 2 L via nasal cannula, her oxygen is 95%. Off oxygen, her oxygen saturations are 88%. We will arrange this tonight from a home health agency. She also would benefit from given nebs 1 inhaled every 6 hours to treat the wheezing and bronchospasm. I will continue her on Omnicef and Zithromax and recheck in 48 hours. If her symptoms get worse she is to return to the hospital immediately  11/01/15 Patient is doing much better today. Her oxygen saturation is 97% on 2 L. Off oxygen it drops  to 94% but she feels much better. She is using DuoNeb's every 8 hours and her breathing has improved dramatically. She still has some mild congestion in her right base and some faint expiratory wheezes but her lungs sound much improved compared to 2 days ago.  At that time, my plan was: Patient looks substantially better compared to 2 days ago. I will recheck the patient next week. Hopefully at that time we can discontinue oxygen altogether. Asked her to wean back on the DuoNeb nebs to every 6 hours as needed rather than every 8 hours scheduled   11/08/15 Patient has taken a turn for the worse. Her breathing has worsened. She is more short of breath. She reports more fatigue. She is not wearing her oxygen today and she is 88% on room air. On examination she is moving good air. There is no expiratory wheezes. Unfortunately she has pronounced right basilar and left basilar crackles and rails therefore not previously appreciated.  At that time, my plan was: Begin Levaquin 750 mg by mouth daily immediately. Recheck next week. Begin wearing oxygen 24/7. If symptoms worsen return to the hospital. She is actually lost 4 pounds since her last office visit and there is no pitting edema and therefore I do not believe this is any sign of fluid overload.  11/15/15 She feels much better today. Her breathing is almost back to her baseline. She is only using DuoNeb nebs once a day. She's been able  to wean herself off oxygen. She is 95% on room air. Her lungs sound much clearer. There are faint crackles in the right posterior base but otherwise the lungs have cleared dramatically from last week Past Medical History  Diagnosis Date  . Duodenitis without mention of hemorrhage   . Diverticulosis of colon (without mention of hemorrhage)   . Hemorrhoids   . Anxiety   . CAD (coronary artery disease)   . Arthritis   . Insomnia   . Tinnitus   . Vertigo   . Diabetes mellitus   . Depression   . GERD (gastroesophageal  reflux disease)   . Hypercholesteremia   . Hypertension   . Chronic kidney disease     Renal insufficiency   Past Surgical History  Procedure Laterality Date  . Cholecystectomy    . Angioplasty    . Tubal ligation    . Rotator cuff repair Right 04/2012   Current Outpatient Prescriptions on File Prior to Visit  Medication Sig Dispense Refill  . albuterol (PROVENTIL HFA;VENTOLIN HFA) 108 (90 BASE) MCG/ACT inhaler Inhale 2 puffs into the lungs every 4 (four) hours as needed for wheezing or shortness of breath. 1 Inhaler 2  . aspirin 325 MG tablet Take 325 mg by mouth daily.    Marland Kitchen atorvastatin (LIPITOR) 20 MG tablet Take 1 tablet (20 mg total) by mouth daily. 90 tablet 3  . dexlansoprazole (DEXILANT) 60 MG capsule Take 1 capsule (60 mg total) by mouth daily. 90 capsule 3  . diltiazem (CARDIZEM) 120 MG tablet Take 1 tablet (120 mg total) by mouth daily. 30 tablet 3  . fexofenadine (ALLEGRA) 180 MG tablet Take 1 tablet (180 mg total) by mouth daily. 90 tablet 3  . glucose blood (FREESTYLE LITE) test strip Pt checks BS bid - also needs lancets disp: 1 box with prn refills 100 each 11  . HYDROcodone-acetaminophen (NORCO) 10-325 MG per tablet Take 1 tablet by mouth every 6 (six) hours as needed for moderate pain.     Marland Kitchen ipratropium-albuterol (DUONEB) 0.5-2.5 (3) MG/3ML SOLN Take 3 mLs by nebulization every 6 (six) hours as needed. 360 mL 0  . latanoprost (XALATAN) 0.005 % ophthalmic solution INSTILL 1 DROP INTO EACH EYE AT BEDTIME  4  . losartan (COZAAR) 50 MG tablet Take 1 tablet (50 mg total) by mouth daily. 90 tablet 3  . metFORMIN (GLUCOPHAGE) 1000 MG tablet Take 1 tablet (1,000 mg total) by mouth 2 (two) times daily with a meal. 180 tablet 3  . mometasone (NASONEX) 50 MCG/ACT nasal spray Place 2 sprays into the nose daily. Please dispense 3 bottles for a 3 month supply (Patient taking differently: Place 2 sprays into the nose daily as needed (allergies). ) 51 g 3  . oxymorphone (OPANA ER) 20 MG  12 hr tablet Take 20 mg by mouth every 12 (twelve) hours.    Marland Kitchen rOPINIRole (REQUIP) 2 MG tablet Take 1 tablet (2 mg total) by mouth at bedtime. (Patient taking differently: Take 1-2 mg by mouth at bedtime. ) 90 tablet 3  . tolterodine (DETROL LA) 2 MG 24 hr capsule Take 1 capsule (2 mg total) by mouth daily. 30 capsule 3   No current facility-administered medications on file prior to visit.   Allergies  Allergen Reactions  . Guaifenesin & Derivatives     Severe Reaction  . Benazepril Other (See Comments)    cough  . Latex     REACTION: powder on gloves  . Morphine  REACTION: nausea  . Zyrtec [Cetirizine] Other (See Comments)    swelling   Social History   Social History  . Marital Status: Married    Spouse Name: N/A  . Number of Children: 1  . Years of Education: N/A   Occupational History  . Retired    Social History Main Topics  . Smoking status: Current Every Day Smoker    Types: Cigarettes    Last Attempt to Quit: 09/11/2008  . Smokeless tobacco: Never Used  . Alcohol Use: No  . Drug Use: No  . Sexual Activity: Not on file   Other Topics Concern  . Not on file   Social History Narrative     Review of Systems  All other systems reviewed and are negative.      Objective:   Physical Exam  Constitutional: She appears well-developed and well-nourished.  Neck: No JVD present.  Cardiovascular: Normal rate, regular rhythm and normal heart sounds.   Pulmonary/Chest: Effort normal. No respiratory distress. She has no wheezes. She has rales.  Abdominal: Soft. Bowel sounds are normal.  Musculoskeletal: She exhibits no edema.  Vitals reviewed.         Assessment & Plan:  CAP (community acquired pneumonia) - Plan: DG Chest 2 View Clinically much improved. Finish abx. Repeat chest x-ray in one month. Recheck in one week if not almost 100% better.

## 2015-11-26 ENCOUNTER — Other Ambulatory Visit: Payer: Self-pay | Admitting: Family Medicine

## 2015-11-26 DIAGNOSIS — G894 Chronic pain syndrome: Secondary | ICD-10-CM | POA: Diagnosis not present

## 2015-11-26 DIAGNOSIS — M545 Low back pain: Secondary | ICD-10-CM | POA: Diagnosis not present

## 2015-11-26 DIAGNOSIS — M25511 Pain in right shoulder: Secondary | ICD-10-CM | POA: Diagnosis not present

## 2015-11-26 DIAGNOSIS — M542 Cervicalgia: Secondary | ICD-10-CM | POA: Diagnosis not present

## 2015-11-26 DIAGNOSIS — Z79891 Long term (current) use of opiate analgesic: Secondary | ICD-10-CM | POA: Diagnosis not present

## 2015-11-26 DIAGNOSIS — M25512 Pain in left shoulder: Secondary | ICD-10-CM | POA: Diagnosis not present

## 2015-11-27 NOTE — Telephone Encounter (Signed)
Refill appropriate and filled per protocol. 

## 2015-11-29 ENCOUNTER — Telehealth: Payer: Self-pay | Admitting: Family Medicine

## 2015-11-29 NOTE — Telephone Encounter (Signed)
ok 

## 2015-11-29 NOTE — Telephone Encounter (Signed)
-----   Message from Berton Mount sent at 11/29/2015 10:54 AM EDT ----- Regarding: RE: [place] off 02 was performed and patient still shows a strong need for 02 at night.  She was below 90 for almost 4 hours, below 89 for 2 hours, lowest sp02 at night was 69%.  I will fax over report now, we can pickup her portable tanks, but our respiratory therapist recommendation is to keep 02 at night.  Thanks!  Mandy at [place]  ----- Message -----    From: Susy Frizzle, MD    Sent: 11/22/2015  11:32 AM      To: Berton Mount Subject: RE: URGENT OXYGEN                              I am okay with this.   ----- Message -----    From: Berton Mount    Sent: 11/22/2015  10:50 AM      To: Judieth Keens, # Subject: FW: URGENT OXYGEN                              Dr Dennard Schaumann and Maudie Mercury,  Can we do an ono off 02 to see if she still needs 02 at night before we pickup?  She may not need 24 hr 02, so we can pickup her tanks, but just do ono to check for nightime desats before we pickup concentrator?  If this is okay, just reply okay and we can complete test- her insurance will not be charged, this is assessment.  Thanks so much!  Mandy at [place]  ----- Message -----    From: Olena Mater, LPN    Sent: [id number]   4:27 PM      To: Berton Mount Subject: URGENT OXYGEN                                  PT NEEDS O2 IN HOUSE TONIGHT

## 2015-11-29 NOTE — Telephone Encounter (Signed)
OK to order O2 for at bedtime?

## 2015-12-03 NOTE — Telephone Encounter (Signed)
[facility] has been made aware

## 2015-12-10 ENCOUNTER — Other Ambulatory Visit: Payer: Medicare Other

## 2015-12-10 DIAGNOSIS — E119 Type 2 diabetes mellitus without complications: Secondary | ICD-10-CM | POA: Diagnosis not present

## 2015-12-10 DIAGNOSIS — N189 Chronic kidney disease, unspecified: Secondary | ICD-10-CM

## 2015-12-10 DIAGNOSIS — I10 Essential (primary) hypertension: Secondary | ICD-10-CM | POA: Diagnosis not present

## 2015-12-10 DIAGNOSIS — E785 Hyperlipidemia, unspecified: Secondary | ICD-10-CM | POA: Diagnosis not present

## 2015-12-10 LAB — COMPLETE METABOLIC PANEL WITH GFR
ALT: 13 U/L (ref 6–29)
AST: 15 U/L (ref 10–35)
Albumin: 3.6 g/dL (ref 3.6–5.1)
Alkaline Phosphatase: 48 U/L (ref 33–130)
BUN: 9 mg/dL (ref 7–25)
CHLORIDE: 102 mmol/L (ref 98–110)
CO2: 27 mmol/L (ref 20–31)
Calcium: 9.4 mg/dL (ref 8.6–10.4)
Creat: 0.67 mg/dL (ref 0.50–0.99)
GFR, Est Non African American: >89 mL/min (ref >=60)
Glucose, Bld: 122 mg/dL — ABNORMAL HIGH (ref 70–99)
POTASSIUM: 4.8 mmol/L (ref 3.5–5.3)
SODIUM: 139 mmol/L (ref 135–146)
TOTAL PROTEIN: 6.9 g/dL (ref 6.1–8.1)
Total Bilirubin: 0.3 mg/dL (ref 0.2–1.2)

## 2015-12-10 LAB — LIPID PANEL
CHOL/HDL RATIO: 3.1 ratio (ref <=5.0)
CHOLESTEROL: 154 mg/dL (ref 125–200)
HDL: 49 mg/dL (ref >=46)
LDL Cholesterol: 87 mg/dL (ref <130)
TRIGLYCERIDES: 90 mg/dL (ref <150)
VLDL: 18 mg/dL (ref <30)

## 2015-12-10 LAB — CBC WITH DIFFERENTIAL/PLATELET
BASOS ABS: 96 {cells}/uL (ref 0–200)
BASOS PCT: 1 %
EOS ABS: 288 {cells}/uL (ref 15–500)
Eosinophils Relative: 3 %
HEMATOCRIT: 40.5 % (ref 35.0–45.0)
Hemoglobin: 13.3 g/dL (ref 12.0–15.0)
LYMPHS PCT: 42 %
Lymphs Abs: 4032 cells/uL — ABNORMAL HIGH (ref 850–3900)
MCH: 29.2 pg (ref 27.0–33.0)
MCHC: 32.8 g/dL (ref 32.0–36.0)
MCV: 89 fL (ref 80.0–100.0)
MONO ABS: 864 {cells}/uL (ref 200–950)
MONOS PCT: 9 %
MPV: 9.6 fL (ref 7.5–12.5)
Neutro Abs: 4320 cells/uL (ref 1500–7800)
Neutrophils Relative %: 45 %
PLATELETS: 328 10*3/uL (ref 140–400)
RBC: 4.55 MIL/uL (ref 3.80–5.10)
RDW: 16 % — AB (ref 11.0–15.0)
WBC: 9.6 10*3/uL (ref 3.8–10.8)

## 2015-12-10 LAB — HEMOGLOBIN A1C
Hgb A1c MFr Bld: 6.2 % — ABNORMAL HIGH (ref <5.7)
Mean Plasma Glucose: 131 mg/dL

## 2015-12-10 LAB — TSH: TSH: 2.59 m[IU]/L

## 2015-12-13 ENCOUNTER — Ambulatory Visit: Payer: Medicare Other | Admitting: Family Medicine

## 2015-12-18 ENCOUNTER — Ambulatory Visit (INDEPENDENT_AMBULATORY_CARE_PROVIDER_SITE_OTHER): Payer: Medicare Other | Admitting: Family Medicine

## 2015-12-18 ENCOUNTER — Encounter: Payer: Self-pay | Admitting: Family Medicine

## 2015-12-18 ENCOUNTER — Other Ambulatory Visit: Payer: Self-pay | Admitting: Family Medicine

## 2015-12-18 VITALS — BP 142/64 | HR 80 | Temp 98.1°F | Resp 18 | Ht 64.0 in | Wt 182.0 lb

## 2015-12-18 DIAGNOSIS — E785 Hyperlipidemia, unspecified: Secondary | ICD-10-CM

## 2015-12-18 DIAGNOSIS — I10 Essential (primary) hypertension: Secondary | ICD-10-CM | POA: Diagnosis not present

## 2015-12-18 DIAGNOSIS — E119 Type 2 diabetes mellitus without complications: Secondary | ICD-10-CM

## 2015-12-18 DIAGNOSIS — Z1159 Encounter for screening for other viral diseases: Secondary | ICD-10-CM

## 2015-12-18 DIAGNOSIS — N3281 Overactive bladder: Secondary | ICD-10-CM

## 2015-12-18 MED ORDER — LATANOPROST 0.005 % OP SOLN: OPHTHALMIC | Status: DC

## 2015-12-18 MED ORDER — LOSARTAN POTASSIUM 50 MG PO TABS: 50.0000 mg | ORAL_TABLET | Freq: Every day | ORAL | Status: DC

## 2015-12-18 MED ORDER — DEXLANSOPRAZOLE 60 MG PO CPDR: 60.0000 mg | DELAYED_RELEASE_CAPSULE | Freq: Every day | ORAL | Status: DC

## 2015-12-18 MED ORDER — TOLTERODINE TARTRATE ER 2 MG PO CP24: 2.0000 mg | ORAL_CAPSULE | Freq: Every day | ORAL | Status: DC

## 2015-12-18 MED ORDER — DILTIAZEM HCL 120 MG PO TABS: 120.0000 mg | ORAL_TABLET | Freq: Every day | ORAL | Status: DC

## 2015-12-18 MED ORDER — ROPINIROLE HCL 2 MG PO TABS: 2.0000 mg | ORAL_TABLET | Freq: Two times a day (BID) | ORAL | Status: DC

## 2015-12-18 MED ORDER — FEXOFENADINE HCL 180 MG PO TABS: 180.0000 mg | ORAL_TABLET | Freq: Every day | ORAL | Status: DC

## 2015-12-18 MED ORDER — ATORVASTATIN CALCIUM 20 MG PO TABS: 20.0000 mg | ORAL_TABLET | Freq: Every day | ORAL | Status: DC

## 2015-12-18 MED ORDER — METFORMIN HCL 1000 MG PO TABS: 1000.0000 mg | ORAL_TABLET | Freq: Two times a day (BID) | ORAL | Status: DC

## 2015-12-18 NOTE — Progress Notes (Signed)
Subjective:    Patient ID: [name] [name], female    DOB: 1950-10-19, 65 y.o.   MRN: [id number]  HPI  10/30/15 Admitted to the hospital with community-acquired pneumonia. Was discharged from the hospital March 19. Chest x-ray revealed:  Right middle lobe consolidation. Followup PA and lateral chest X-ray is recommended in 3-4 weeks following trial of antibiotic therapy to ensure resolution and exclude underlying malignancy.  Was discharged on azithromycin 500 mg by mouth daily for 4 days and Omnicef 300 mg by mouth twice a day for 6 days.  She was also given prednisone 50 mg by mouth daily for an additional 4 days given her respiratory failure there was due in part to her commute quite pneumonia and part 2 reactive airway disease and possibly underlying COPD.  Today in the clinic, the patient's oxygen saturation is 88% on room air. With ambulation it drops to 87.  She is extremely short of breath. She has expiratory wheezes bilaterally along with rails particular prominent in the right lower lobe posteriorly. Patient does not feel any better. She does not have a nebulizer at home. She reports dyspnea on exertion. Her cough is nonproductive which I feel is due to the fact she is experiencing bronchospasms and is unable to generate air pressure to expel the mucus.  At that time, my plan was: Patient is no better from when she left the hospital. She is also hypoxic on room air. She needs oxygen 2 L via nasal cannula. On 2 L via nasal cannula, her oxygen is 95%. Off oxygen, her oxygen saturations are 88%. We will arrange this tonight from a home health agency. She also would benefit from given nebs 1 inhaled every 6 hours to treat the wheezing and bronchospasm. I will continue her on Omnicef and Zithromax and recheck in 48 hours. If her symptoms get worse she is to return to the hospital immediately  11/01/15 Patient is doing much better today. Her oxygen saturation is 97% on 2 L. Off oxygen it drops  to 94% but she feels much better. She is using DuoNeb's every 8 hours and her breathing has improved dramatically. She still has some mild congestion in her right base and some faint expiratory wheezes but her lungs sound much improved compared to 2 days ago.  At that time, my plan was: Patient looks substantially better compared to 2 days ago. I will recheck the patient next week. Hopefully at that time we can discontinue oxygen altogether. Asked her to wean back on the DuoNeb nebs to every 6 hours as needed rather than every 8 hours scheduled   11/08/15 Patient has taken a turn for the worse. Her breathing has worsened. She is more short of breath. She reports more fatigue. She is not wearing her oxygen today and she is 88% on room air. On examination she is moving good air. There is no expiratory wheezes. Unfortunately she has pronounced right basilar and left basilar crackles and rails therefore not previously appreciated.  At that time, my plan was: Begin Levaquin 750 mg by mouth daily immediately. Recheck next week. Begin wearing oxygen 24/7. If symptoms worsen return to the hospital. She is actually lost 4 pounds since her last office visit and there is no pitting edema and therefore I do not believe this is any sign of fluid overload.  11/15/15 She feels much better today. Her breathing is almost back to her baseline. She is only using DuoNeb nebs once a day. She's been able  to wean herself off oxygen. She is 95% on room air. Her lungs sound much clearer. There are faint crackles in the right posterior base but otherwise the lungs have cleared dramatically from last week.  At that time, my plan was: Clinically much improved. Finish abx. Repeat chest x-ray in one month. Recheck in one week if not almost 100% better.    12/18/15  Here today for follow up of her medical problems including hypertension, type 2 diabetes mellitus, hyperlipidemia, and history of ASCVD. Most recent lab work as listed  below: Lab on 12/10/2015  Component Date Value Ref Range Status  . Sodium 12/10/2015 139  135 - 146 mmol/L Final  . Potassium 12/10/2015 4.8  3.5 - 5.3 mmol/L Final  . Chloride 12/10/2015 102  98 - 110 mmol/L Final  . CO2 12/10/2015 27  20 - 31 mmol/L Final  . Glucose, Bld 12/10/2015 122* 70 - 99 mg/dL Final  . BUN 12/10/2015 9  7 - 25 mg/dL Final  . Creat 12/10/2015 0.67  0.50 - 0.99 mg/dL Final  . Total Bilirubin 12/10/2015 0.3  0.2 - 1.2 mg/dL Final  . Alkaline Phosphatase 12/10/2015 48  33 - 130 U/L Final  . AST 12/10/2015 15  10 - 35 U/L Final  . ALT 12/10/2015 13  6 - 29 U/L Final  . Total Protein 12/10/2015 6.9  6.1 - 8.1 g/dL Final  . Albumin 12/10/2015 3.6  3.6 - 5.1 g/dL Final  . Calcium 12/10/2015 9.4  8.6 - 10.4 mg/dL Final  . GFR, Est African American 12/10/2015 >89  >=60 mL/min Final  . GFR, Est Non African American 12/10/2015 >89  >=60 mL/min Final   Comment:   The estimated GFR is a calculation valid for adults (>=33 years old) that uses the CKD-EPI algorithm to adjust for age and sex. It is   not to be used for children, pregnant women, hospitalized patients,    patients on dialysis, or with rapidly changing kidney function. According to the NKDEP, eGFR >89 is normal, 60-89 shows mild impairment, 30-59 shows moderate impairment, 15-29 shows severe impairment and <15 is ESRD.     . TSH 12/10/2015 2.59   Final   Comment:   Reference Range   > or = 20 Years  0.40-4.50   Pregnancy Range First trimester  0.26-2.66 Second trimester 0.55-2.73 Third trimester  0.43-2.91     . Cholesterol 12/10/2015 154  125 - 200 mg/dL Final  . Triglycerides 12/10/2015 90  <150 mg/dL Final  . HDL 12/10/2015 49  >=46 mg/dL Final  . Total CHOL/HDL Ratio 12/10/2015 3.1  <=5.0 Ratio Final  . VLDL 12/10/2015 18  <30 mg/dL Final  . LDL Cholesterol 12/10/2015 87  <130 mg/dL Final   Comment:   Total Cholesterol/HDL Ratio:CHD Risk                        Coronary Heart Disease Risk  Table                                        Men       Women          1/2 Average Risk              3.4        3.3  Average Risk              5.0        4.4           2X Average Risk              9.6        7.1           3X Average Risk             23.4       11.0 Use the calculated Patient Ratio above and the CHD Risk table  to determine the patient's CHD Risk.   . WBC 12/10/2015 9.6  3.8 - 10.8 K/uL Final  . RBC 12/10/2015 4.55  3.80 - 5.10 MIL/uL Final  . Hemoglobin 12/10/2015 13.3  12.0 - 15.0 g/dL Final  . HCT 12/10/2015 40.5  35.0 - 45.0 % Final  . MCV 12/10/2015 89.0  80.0 - 100.0 fL Final  . MCH 12/10/2015 29.2  27.0 - 33.0 pg Final  . MCHC 12/10/2015 32.8  32.0 - 36.0 g/dL Final  . RDW 12/10/2015 16.0* 11.0 - 15.0 % Final  . Platelets 12/10/2015 328  140 - 400 K/uL Final  . MPV 12/10/2015 9.6  7.5 - 12.5 fL Final  . Neutro Abs 12/10/2015 4320  1500 - 7800 cells/uL Final  . Lymphs Abs 12/10/2015 4032* 850 - 3900 cells/uL Final  . Monocytes Absolute 12/10/2015 864  200 - 950 cells/uL Final  . Eosinophils Absolute 12/10/2015 288  15 - 500 cells/uL Final  . Basophils Absolute 12/10/2015 96  0 - 200 cells/uL Final  . Neutrophils Relative % 12/10/2015 45   Final  . Lymphocytes Relative 12/10/2015 42   Final  . Monocytes Relative 12/10/2015 9   Final  . Eosinophils Relative 12/10/2015 3   Final  . Basophils Relative 12/10/2015 1   Final  . Smear Review 12/10/2015 Criteria for review not met   Final   ** Please note change in unit of measure and reference range(s). **  . Hgb A1c MFr Bld 12/10/2015 6.2* <5.7 % Final   Comment:   For someone without known diabetes, a hemoglobin A1c value between 5.7% and 6.4% is consistent with prediabetes and should be confirmed with a follow-up test.   For someone with known diabetes, a value <7% indicates that their diabetes is well controlled. A1c targets should be individualized based on duration of diabetes, age, co-morbid  conditions and other considerations.   This assay result is consistent with an increased risk of diabetes.   Currently, no consensus exists regarding use of hemoglobin A1c for diagnosis of diabetes in children.     . Mean Plasma Glucose 12/10/2015 131   Final   Her labs are excellent. Hemoglobin A1c is well below goal. LDL cholesterol is slightly elevated. Given her history of CAD, I would like her LDL cholesterol to be less than 70. She denies any polyuria, polydipsia, or blurred vision. She denies any myalgias or right upper quadrant pain. She denies any chest pain shortness of breath or dyspnea on exertion.  She is due for diabetic eye exam. She is due for foot exam. She is due for hepatitis C screening as well as a urine microalbumin Past Medical History  Diagnosis Date  . Duodenitis without mention of hemorrhage   . Diverticulosis of colon (without mention of hemorrhage)   . Hemorrhoids   . Anxiety   . CAD (coronary artery disease)   .  Arthritis   . Insomnia   . Tinnitus   . Vertigo   . Diabetes mellitus   . Depression   . GERD (gastroesophageal reflux disease)   . Hypercholesteremia   . Hypertension   . Chronic kidney disease     Renal insufficiency   Past Surgical History  Procedure Laterality Date  . Cholecystectomy    . Angioplasty    . Tubal ligation    . Rotator cuff repair Right 04/2012   Current Outpatient Prescriptions on File Prior to Visit  Medication Sig Dispense Refill  . albuterol (PROVENTIL HFA;VENTOLIN HFA) 108 (90 BASE) MCG/ACT inhaler Inhale 2 puffs into the lungs every 4 (four) hours as needed for wheezing or shortness of breath. 1 Inhaler 2  . aspirin 325 MG tablet Take 325 mg by mouth daily.    Marland Kitchen atorvastatin (LIPITOR) 20 MG tablet Take 1 tablet (20 mg total) by mouth daily. 90 tablet 3  . dexlansoprazole (DEXILANT) 60 MG capsule Take 1 capsule (60 mg total) by mouth daily. 90 capsule 3  . diltiazem (CARDIZEM) 120 MG tablet Take 1 tablet (120 mg  total) by mouth daily. 30 tablet 3  . fexofenadine (ALLEGRA) 180 MG tablet Take 1 tablet (180 mg total) by mouth daily. 90 tablet 3  . glucose blood (FREESTYLE LITE) test strip Pt checks BS bid - also needs lancets disp: 1 box with prn refills 100 each 11  . HYDROcodone-acetaminophen (NORCO) 10-325 MG per tablet Take 1 tablet by mouth every 6 (six) hours as needed for moderate pain.     Marland Kitchen ipratropium-albuterol (DUONEB) 0.5-2.5 (3) MG/3ML SOLN INHALE 1 VIAL VIA NEBULIZER EVERY 6 HOURS AS NEEDED 360 mL 0  . latanoprost (XALATAN) 0.005 % ophthalmic solution INSTILL 1 DROP INTO EACH EYE AT BEDTIME  4  . losartan (COZAAR) 50 MG tablet Take 1 tablet (50 mg total) by mouth daily. 90 tablet 3  . metFORMIN (GLUCOPHAGE) 1000 MG tablet Take 1 tablet (1,000 mg total) by mouth 2 (two) times daily with a meal. 180 tablet 3  . mometasone (NASONEX) 50 MCG/ACT nasal spray Place 2 sprays into the nose daily. Please dispense 3 bottles for a 3 month supply (Patient taking differently: Place 2 sprays into the nose daily as needed (allergies). ) 51 g 3  . oxymorphone (OPANA ER) 20 MG 12 hr tablet Take 20 mg by mouth every 12 (twelve) hours.    Marland Kitchen rOPINIRole (REQUIP) 2 MG tablet Take 1 tablet (2 mg total) by mouth at bedtime. (Patient taking differently: Take 1-2 mg by mouth at bedtime. ) 90 tablet 3  . tolterodine (DETROL LA) 2 MG 24 hr capsule Take 1 capsule (2 mg total) by mouth daily. 30 capsule 3   No current facility-administered medications on file prior to visit.   Allergies  Allergen Reactions  . Guaifenesin & Derivatives     Severe Reaction  . Benazepril Other (See Comments)    cough  . Latex     REACTION: powder on gloves  . Morphine     REACTION: nausea  . Zyrtec [Cetirizine] Other (See Comments)    swelling   Social History   Social History  . Marital Status: Married    Spouse Name: N/A  . Number of Children: 1  . Years of Education: N/A   Occupational History  . Retired    Social History  Main Topics  . Smoking status: Current Every Day Smoker    Types: Cigarettes    Last Attempt  to Quit: 09/11/2008  . Smokeless tobacco: Never Used  . Alcohol Use: No  . Drug Use: No  . Sexual Activity: Not on file   Other Topics Concern  . Not on file   Social History Narrative     Review of Systems  All other systems reviewed and are negative.      Objective:   Physical Exam  Constitutional: She appears well-developed and well-nourished.  Neck: No JVD present.  Cardiovascular: Normal rate, regular rhythm and normal heart sounds.   Pulmonary/Chest: Effort normal. No respiratory distress. She has no wheezes. She has no rales.  Abdominal: Soft. Bowel sounds are normal.  Musculoskeletal: She exhibits no edema.  Vitals reviewed.         Assessment & Plan:   Essential hypertension  Controlled type 2 diabetes mellitus without complication, without long-term current use of insulin ([place]) - Plan: Ambulatory referral to Ophthalmology, Microalbumin, urine  HLD (hyperlipidemia)  Need for hepatitis C screening test - Plan: Hepatitis C Ab Reflex HCV RNA, QUANT  Her hemoglobin A1c test is excellent. Her LDL cholesterol is elevated and I recommended that we increase Lipitor to 40 mg a day and recheck in 3 months. Her blood pressures borderline and I have asked the patient check her blood pressure at home frequently and should her blood pressure consistently be higher than 733 systolic, I would increase losartan to 100 mg a day. I exam is up-to-date. The patient sees her own ophthalmologist and therefore I updated her chart. Foot exam is normal. She does have diminished pulses palpably but she has had arterial Dopplers of the legs which showed normal ABIs recently at the pain clinic. I will check a urine microalbumin.

## 2015-12-18 NOTE — Addendum Note (Signed)
Addended by: Shary Decamp B on: 12/18/2015 10:45 AM   Modules accepted: Orders

## 2015-12-19 LAB — HEPATITIS C ANTIBODY: HCV AB: NEGATIVE

## 2015-12-19 LAB — MICROALBUMIN, URINE: Microalb, Ur: 0.2 mg/dL

## 2015-12-25 DIAGNOSIS — H40053 Ocular hypertension, bilateral: Secondary | ICD-10-CM | POA: Diagnosis not present

## 2015-12-28 DIAGNOSIS — M545 Low back pain: Secondary | ICD-10-CM | POA: Diagnosis not present

## 2015-12-28 DIAGNOSIS — G894 Chronic pain syndrome: Secondary | ICD-10-CM | POA: Diagnosis not present

## 2015-12-28 DIAGNOSIS — M542 Cervicalgia: Secondary | ICD-10-CM | POA: Diagnosis not present

## 2015-12-28 DIAGNOSIS — M25512 Pain in left shoulder: Secondary | ICD-10-CM | POA: Diagnosis not present

## 2015-12-28 DIAGNOSIS — M25511 Pain in right shoulder: Secondary | ICD-10-CM | POA: Diagnosis not present

## 2015-12-28 DIAGNOSIS — Z79891 Long term (current) use of opiate analgesic: Secondary | ICD-10-CM | POA: Diagnosis not present

## 2016-01-01 ENCOUNTER — Other Ambulatory Visit: Payer: Self-pay | Admitting: Physician Assistant

## 2016-01-01 NOTE — Telephone Encounter (Signed)
Medication refilled per protocol. 

## 2016-01-09 ENCOUNTER — Encounter: Payer: Self-pay | Admitting: Family Medicine

## 2016-01-25 DIAGNOSIS — H02831 Dermatochalasis of right upper eyelid: Secondary | ICD-10-CM | POA: Diagnosis not present

## 2016-01-25 DIAGNOSIS — H02834 Dermatochalasis of left upper eyelid: Secondary | ICD-10-CM | POA: Diagnosis not present

## 2016-01-28 DIAGNOSIS — M545 Low back pain: Secondary | ICD-10-CM | POA: Diagnosis not present

## 2016-01-28 DIAGNOSIS — M25512 Pain in left shoulder: Secondary | ICD-10-CM | POA: Diagnosis not present

## 2016-01-28 DIAGNOSIS — Z79891 Long term (current) use of opiate analgesic: Secondary | ICD-10-CM | POA: Diagnosis not present

## 2016-01-28 DIAGNOSIS — M5481 Occipital neuralgia: Secondary | ICD-10-CM | POA: Diagnosis not present

## 2016-01-28 DIAGNOSIS — M25511 Pain in right shoulder: Secondary | ICD-10-CM | POA: Diagnosis not present

## 2016-01-28 DIAGNOSIS — M79622 Pain in left upper arm: Secondary | ICD-10-CM | POA: Diagnosis not present

## 2016-01-28 DIAGNOSIS — M5412 Radiculopathy, cervical region: Secondary | ICD-10-CM | POA: Diagnosis not present

## 2016-01-28 DIAGNOSIS — R202 Paresthesia of skin: Secondary | ICD-10-CM | POA: Diagnosis not present

## 2016-01-28 DIAGNOSIS — G894 Chronic pain syndrome: Secondary | ICD-10-CM | POA: Diagnosis not present

## 2016-02-15 ENCOUNTER — Other Ambulatory Visit: Payer: Self-pay | Admitting: Family Medicine

## 2016-02-15 DIAGNOSIS — Z1231 Encounter for screening mammogram for malignant neoplasm of breast: Secondary | ICD-10-CM

## 2016-02-21 DIAGNOSIS — H40053 Ocular hypertension, bilateral: Secondary | ICD-10-CM | POA: Diagnosis not present

## 2016-02-25 DIAGNOSIS — M25512 Pain in left shoulder: Secondary | ICD-10-CM | POA: Diagnosis not present

## 2016-02-25 DIAGNOSIS — M545 Low back pain: Secondary | ICD-10-CM | POA: Diagnosis not present

## 2016-02-25 DIAGNOSIS — Z79891 Long term (current) use of opiate analgesic: Secondary | ICD-10-CM | POA: Diagnosis not present

## 2016-02-25 DIAGNOSIS — M25511 Pain in right shoulder: Secondary | ICD-10-CM | POA: Diagnosis not present

## 2016-02-25 DIAGNOSIS — M542 Cervicalgia: Secondary | ICD-10-CM | POA: Diagnosis not present

## 2016-02-25 DIAGNOSIS — G894 Chronic pain syndrome: Secondary | ICD-10-CM | POA: Diagnosis not present

## 2016-03-11 ENCOUNTER — Ambulatory Visit
Admission: RE | Admit: 2016-03-11 | Discharge: 2016-03-11 | Disposition: A | Discharge status: Home / Self Care | Payer: Medicare Other | Source: Ambulatory Visit | Attending: Family Medicine | Admitting: Family Medicine

## 2016-03-11 DIAGNOSIS — Z1231 Encounter for screening mammogram for malignant neoplasm of breast: Secondary | ICD-10-CM | POA: Diagnosis not present

## 2016-03-15 ENCOUNTER — Other Ambulatory Visit: Payer: Self-pay | Admitting: Family Medicine

## 2016-03-17 NOTE — Telephone Encounter (Signed)
Ok to refill?  Last prescribed 11/2013.

## 2016-03-17 NOTE — Telephone Encounter (Signed)
Prescription sent to pharmacy.

## 2016-03-17 NOTE — Telephone Encounter (Signed)
ok 

## 2016-04-04 ENCOUNTER — Other Ambulatory Visit: Payer: Self-pay

## 2016-04-07 DIAGNOSIS — M25511 Pain in right shoulder: Secondary | ICD-10-CM | POA: Diagnosis not present

## 2016-04-07 DIAGNOSIS — Z79891 Long term (current) use of opiate analgesic: Secondary | ICD-10-CM | POA: Diagnosis not present

## 2016-04-07 DIAGNOSIS — M25512 Pain in left shoulder: Secondary | ICD-10-CM | POA: Diagnosis not present

## 2016-04-07 DIAGNOSIS — G894 Chronic pain syndrome: Secondary | ICD-10-CM | POA: Diagnosis not present

## 2016-04-07 DIAGNOSIS — M542 Cervicalgia: Secondary | ICD-10-CM | POA: Diagnosis not present

## 2016-04-07 DIAGNOSIS — M545 Low back pain: Secondary | ICD-10-CM | POA: Diagnosis not present

## 2016-05-05 DIAGNOSIS — M25511 Pain in right shoulder: Secondary | ICD-10-CM | POA: Diagnosis not present

## 2016-05-05 DIAGNOSIS — M545 Low back pain: Secondary | ICD-10-CM | POA: Diagnosis not present

## 2016-05-05 DIAGNOSIS — G894 Chronic pain syndrome: Secondary | ICD-10-CM | POA: Diagnosis not present

## 2016-05-05 DIAGNOSIS — Z79891 Long term (current) use of opiate analgesic: Secondary | ICD-10-CM | POA: Diagnosis not present

## 2016-05-05 DIAGNOSIS — G890 Central pain syndrome: Secondary | ICD-10-CM | POA: Diagnosis not present

## 2016-05-05 DIAGNOSIS — M542 Cervicalgia: Secondary | ICD-10-CM | POA: Diagnosis not present

## 2016-05-05 DIAGNOSIS — M25512 Pain in left shoulder: Secondary | ICD-10-CM | POA: Diagnosis not present

## 2016-05-08 ENCOUNTER — Ambulatory Visit (INDEPENDENT_AMBULATORY_CARE_PROVIDER_SITE_OTHER): Payer: Medicare Other | Admitting: Family Medicine

## 2016-05-08 VITALS — BP 146/60 | HR 88 | Temp 98.1°F | Resp 20 | Ht 64.0 in | Wt 181.0 lb

## 2016-05-08 DIAGNOSIS — J189 Pneumonia, unspecified organism: Secondary | ICD-10-CM

## 2016-05-08 MED ORDER — PREDNISONE 20 MG PO TABS: ORAL_TABLET | ORAL | 0 refills | Status: DC

## 2016-05-08 MED ORDER — LEVOFLOXACIN 500 MG PO TABS: 500.0000 mg | ORAL_TABLET | Freq: Every day | ORAL | 0 refills | Status: DC

## 2016-05-08 NOTE — Progress Notes (Signed)
Subjective:    Patient ID: [name] [name], female    DOB: [birth year], 65 y.o.   MRN: [id number]  HPI 10/30/15 Admitted to the hospital with community-acquired pneumonia. Was discharged from the hospital March 19. Chest x-ray revealed:  Right middle lobe consolidation. Followup PA and lateral chest X-ray is recommended in 3-4 weeks following trial of antibiotic therapy to ensure resolution and exclude underlying malignancy.  Was discharged on azithromycin 500 mg by mouth daily for 4 days and Omnicef 300 mg by mouth twice a day for 6 days.  She was also given prednisone 50 mg by mouth daily for an additional 4 days given her respiratory failure there was due in part to her commute quite pneumonia and part 2 reactive airway disease and possibly underlying COPD.  Today in the clinic, the patient's oxygen saturation is 88% on room air. With ambulation it drops to 87.  She is extremely short of breath. She has expiratory wheezes bilaterally along with rails particular prominent in the right lower lobe posteriorly. Patient does not feel any better. She does not have a nebulizer at home. She reports dyspnea on exertion. Her cough is nonproductive which I feel is due to the fact she is experiencing bronchospasms and is unable to generate air pressure to expel the mucus.  At that time, my plan was: Patient is no better from when she left the hospital. She is also hypoxic on room air. She needs oxygen 2 L via nasal cannula. On 2 L via nasal cannula, her oxygen is 95%. Off oxygen, her oxygen saturations are 88%. We will arrange this tonight from a home health agency. She also would benefit from given nebs 1 inhaled every 6 hours to treat the wheezing and bronchospasm. I will continue her on Omnicef and Zithromax and recheck in 48 hours. If her symptoms get worse she is to return to the hospital immediately  11/01/15 Patient is doing much better today. Her oxygen saturation is 97% on 2 L. Off oxygen it drops to  94% but she feels much better. She is using DuoNeb's every 8 hours and her breathing has improved dramatically. She still has some mild congestion in her right base and some faint expiratory wheezes but her lungs sound much improved compared to 2 days ago.  At that time, my plan was: Patient looks substantially better compared to 2 days ago. I will recheck the patient next week. Hopefully at that time we can discontinue oxygen altogether. Asked her to wean back on the DuoNeb nebs to every 6 hours as needed rather than every 8 hours scheduled   11/08/15 Patient has taken a turn for the worse. Her breathing has worsened. She is more short of breath. She reports more fatigue. She is not wearing her oxygen today and she is 88% on room air. On examination she is moving good air. There is no expiratory wheezes. Unfortunately she has pronounced right basilar and left basilar crackles and rails therefore not previously appreciated.  At that time, my plan was: Begin Levaquin 750 mg by mouth daily immediately. Recheck next week. Begin wearing oxygen 24/7. If symptoms worsen return to the hospital. She is actually lost 4 pounds since her last office visit and there is no pitting edema and therefore I do not believe this is any sign of fluid overload.  11/15/15 She feels much better today. Her breathing is almost back to her baseline. She is only using DuoNeb nebs once a day. She's been able to  wean herself off oxygen. She is 95% on room air. Her lungs sound much clearer. There are faint crackles in the right posterior base but otherwise the lungs have cleared dramatically from last week.  At that time, my plan was: Clinically much improved. Finish abx. Repeat chest x-ray in one month. Recheck in one week if not almost 100% better.    05/08/16 Unfortunately, the patient continues to smoke. Beginning approximately 1 week ago, she developed an upper respiratory infection. This was followed by increasing shortness of  breath, cough productive of green sputum, dyspnea on exertion, wheezing. She started using nebulizer treatments and her breathing has improved. She denies any fevers. However examination today is significant for bibasilar crackles, rhonchorous breath sounds throughout, and expiratory wheezing Past Medical History:  Diagnosis Date  . Anxiety   . Arthritis   . CAD (coronary artery disease)   . Chronic kidney disease    Renal insufficiency  . Depression   . Diabetes mellitus   . Diverticulosis of colon (without mention of hemorrhage)   . Duodenitis without mention of hemorrhage   . GERD (gastroesophageal reflux disease)   . Hemorrhoids   . Hypercholesteremia   . Hypertension   . Insomnia   . Tinnitus   . Vertigo    Past Surgical History:  Procedure Laterality Date  . ANGIOPLASTY    . CHOLECYSTECTOMY    . ROTATOR CUFF REPAIR Right 04/2012  . TUBAL LIGATION     Current Outpatient Prescriptions on File Prior to Visit  Medication Sig Dispense Refill  . albuterol (PROVENTIL HFA;VENTOLIN HFA) 108 (90 BASE) MCG/ACT inhaler Inhale 2 puffs into the lungs every 4 (four) hours as needed for wheezing or shortness of breath. 1 Inhaler 2  . aspirin 325 MG tablet Take 325 mg by mouth daily.    Marland Kitchen atorvastatin (LIPITOR) 20 MG tablet Take 1 tablet (20 mg total) by mouth daily. 90 tablet 3  . dexlansoprazole (DEXILANT) 60 MG capsule Take 1 capsule (60 mg total) by mouth daily. 90 capsule 3  . diltiazem (CARDIZEM) 120 MG tablet Take 1 tablet (120 mg total) by mouth daily. 30 tablet 3  . fexofenadine (ALLEGRA) 180 MG tablet Take 1 tablet (180 mg total) by mouth daily. 90 tablet 3  . glucose blood (FREESTYLE LITE) test strip Pt checks BS bid - also needs lancets disp: 1 box with prn refills 100 each 11  . HYDROcodone-acetaminophen (NORCO) 10-325 MG per tablet Take 1 tablet by mouth every 6 (six) hours as needed for moderate pain.     Marland Kitchen ipratropium-albuterol (DUONEB) 0.5-2.5 (3) MG/3ML SOLN INHALE 1 VIAL  VIA NEBULIZER EVERY 6 HOURS AS NEEDED 360 mL 0  . losartan (COZAAR) 50 MG tablet Take 1 tablet (50 mg total) by mouth daily. 90 tablet 3  . metFORMIN (GLUCOPHAGE) 1000 MG tablet Take 1 tablet (1,000 mg total) by mouth 2 (two) times daily with a meal. 180 tablet 3  . mometasone (NASONEX) 50 MCG/ACT nasal spray Place 2 sprays into the nose daily. Please dispense 3 bottles for a 3 month supply (Patient taking differently: Place 2 sprays into the nose daily as needed (allergies). ) 51 g 3  . oxymorphone (OPANA ER) 20 MG 12 hr tablet Take 20 mg by mouth every 12 (twelve) hours.    Marland Kitchen rOPINIRole (REQUIP) 2 MG tablet Take 1 tablet (2 mg total) by mouth 2 (two) times daily. 180 tablet 3  . tolterodine (DETROL LA) 2 MG 24 hr capsule TAKE 1 CAPSULE (  2 MG TOTAL) BY MOUTH DAILY. 90 capsule 1   No current facility-administered medications on file prior to visit.    Allergies  Allergen Reactions  . Guaifenesin & Derivatives     Severe Reaction  . Benazepril Other (See Comments)    cough  . Latex     REACTION: powder on gloves  . Morphine     REACTION: nausea  . Zyrtec [Cetirizine] Other (See Comments)    swelling   Social History   Social History  . Marital status: Married    Spouse name: N/A  . Number of children: 1  . Years of education: N/A   Occupational History  . Retired    Social History Main Topics  . Smoking status: Current Every Day Smoker    Types: Cigarettes  . Smokeless tobacco: Never Used  . Alcohol use No  . Drug use: No  . Sexual activity: Not on file   Other Topics Concern  . Not on file   Social History Narrative  . No narrative on file     Review of Systems  All other systems reviewed and are negative.      Objective:   Physical Exam  Constitutional: She appears well-developed and well-nourished.  Neck: No JVD present.  Cardiovascular: Normal rate, regular rhythm and normal heart sounds.   Pulmonary/Chest: Effort normal. No respiratory distress. She has  wheezes in the right upper field, the right lower field and the left upper field. She has rales in the right upper field, the right lower field, the left upper field and the left lower field.  Abdominal: Soft. Bowel sounds are normal.  Musculoskeletal: She exhibits no edema.  Vitals reviewed.         Assessment & Plan:  CAP (community acquired pneumonia) - Plan: levofloxacin (LEVAQUIN) 500 MG tablet, predniSONE (DELTASONE) 20 MG tablet Begin Levaquin 500 mg by mouth daily for 7 days. Begin prednisone taper pack due to the COPD exacerbation triggered by the infection. Use albuterol nebulizer treatments every 6 hours. Recheck on Monday or immediately if worsening

## 2016-05-12 ENCOUNTER — Ambulatory Visit (INDEPENDENT_AMBULATORY_CARE_PROVIDER_SITE_OTHER): Payer: Medicare Other | Admitting: Family Medicine

## 2016-05-12 ENCOUNTER — Encounter: Payer: Self-pay | Admitting: Family Medicine

## 2016-05-12 VITALS — BP 100/48 | HR 82 | Temp 97.7°F | Resp 20 | Ht 64.0 in | Wt 181.0 lb

## 2016-05-12 DIAGNOSIS — J441 Chronic obstructive pulmonary disease with (acute) exacerbation: Secondary | ICD-10-CM

## 2016-05-12 MED ORDER — ZOLPIDEM TARTRATE 10 MG PO TABS: 10.0000 mg | ORAL_TABLET | Freq: Every evening | ORAL | 1 refills | Status: DC | PRN

## 2016-05-12 NOTE — Progress Notes (Signed)
Subjective:    Patient ID: [name] [name], female    DOB: 1951/06/20, 65 y.o.   MRN: [id number]  HPI 10/30/15 Admitted to the hospital with community-acquired pneumonia. Was discharged from the hospital March 19. Chest x-ray revealed:  Right middle lobe consolidation. Followup PA and lateral chest X-ray is recommended in 3-4 weeks following trial of antibiotic therapy to ensure resolution and exclude underlying malignancy.  Was discharged on azithromycin 500 mg by mouth daily for 4 days and Omnicef 300 mg by mouth twice a day for 6 days.  She was also given prednisone 50 mg by mouth daily for an additional 4 days given her respiratory failure there was due in part to her commute quite pneumonia and part 2 reactive airway disease and possibly underlying COPD.  Today in the clinic, the patient's oxygen saturation is 88% on room air. With ambulation it drops to 87.  She is extremely short of breath. She has expiratory wheezes bilaterally along with rails particular prominent in the right lower lobe posteriorly. Patient does not feel any better. She does not have a nebulizer at home. She reports dyspnea on exertion. Her cough is nonproductive which I feel is due to the fact she is experiencing bronchospasms and is unable to generate air pressure to expel the mucus.  At that time, my plan was: Patient is no better from when she left the hospital. She is also hypoxic on room air. She needs oxygen 2 L via nasal cannula. On 2 L via nasal cannula, her oxygen is 95%. Off oxygen, her oxygen saturations are 88%. We will arrange this tonight from a home health agency. She also would benefit from given nebs 1 inhaled every 6 hours to treat the wheezing and bronchospasm. I will continue her on Omnicef and Zithromax and recheck in 48 hours. If her symptoms get worse she is to return to the hospital immediately  11/01/15 Patient is doing much better today. Her oxygen saturation is 97% on 2 L. Off oxygen it drops to  94% but she feels much better. She is using DuoNeb's every 8 hours and her breathing has improved dramatically. She still has some mild congestion in her right base and some faint expiratory wheezes but her lungs sound much improved compared to 2 days ago.  At that time, my plan was: Patient looks substantially better compared to 2 days ago. I will recheck the patient next week. Hopefully at that time we can discontinue oxygen altogether. Asked her to wean back on the DuoNeb nebs to every 6 hours as needed rather than every 8 hours scheduled   11/08/15 Patient has taken a turn for the worse. Her breathing has worsened. She is more short of breath. She reports more fatigue. She is not wearing her oxygen today and she is 88% on room air. On examination she is moving good air. There is no expiratory wheezes. Unfortunately she has pronounced right basilar and left basilar crackles and rails therefore not previously appreciated.  At that time, my plan was: Begin Levaquin 750 mg by mouth daily immediately. Recheck next week. Begin wearing oxygen 24/7. If symptoms worsen return to the hospital. She is actually lost 4 pounds since her last office visit and there is no pitting edema and therefore I do not believe this is any sign of fluid overload.  11/15/15 She feels much better today. Her breathing is almost back to her baseline. She is only using DuoNeb nebs once a day. She's been able to  wean herself off oxygen. She is 95% on room air. Her lungs sound much clearer. There are faint crackles in the right posterior base but otherwise the lungs have cleared dramatically from last week.  At that time, my plan was: Clinically much improved. Finish abx. Repeat chest x-ray in one month. Recheck in one week if not almost 100% better.    05/08/16 Unfortunately, the patient continues to smoke. Beginning approximately 1 week ago, she developed an upper respiratory infection. This was followed by increasing shortness of  breath, cough productive of green sputum, dyspnea on exertion, wheezing. She started using nebulizer treatments and her breathing has improved. She denies any fevers. However examination today is significant for bibasilar crackles, rhonchorous breath sounds throughout, and expiratory wheezing.  At that time, my plan was: Begin Levaquin 500 mg by mouth daily for 7 days. Begin prednisone taper pack due to the COPD exacerbation triggered by the infection. Use albuterol nebulizer treatments every 6 hours. Recheck on Monday or immediately if worsening  05/12/16 Patient does not feel much better today. However her lungs sound much better. There are no respiratory wheezes. The crackles have disappeared from the bottoms of both lungs. Her oxygen saturation at rest is 95%. With ambulation it drops to 91%. Her cough has improved and she is no longer short of breath. However she continues to have severe fatigue Past Medical History:  Diagnosis Date  . Anxiety   . Arthritis   . CAD (coronary artery disease)   . Chronic kidney disease    Renal insufficiency  . Depression   . Diabetes mellitus   . Diverticulosis of colon (without mention of hemorrhage)   . Duodenitis without mention of hemorrhage   . GERD (gastroesophageal reflux disease)   . Hemorrhoids   . Hypercholesteremia   . Hypertension   . Insomnia   . Tinnitus   . Vertigo    Past Surgical History:  Procedure Laterality Date  . ANGIOPLASTY    . CHOLECYSTECTOMY    . ROTATOR CUFF REPAIR Right 04/2012  . TUBAL LIGATION     Current Outpatient Prescriptions on File Prior to Visit  Medication Sig Dispense Refill  . albuterol (PROVENTIL HFA;VENTOLIN HFA) 108 (90 BASE) MCG/ACT inhaler Inhale 2 puffs into the lungs every 4 (four) hours as needed for wheezing or shortness of breath. 1 Inhaler 2  . aspirin 325 MG tablet Take 325 mg by mouth daily.    Marland Kitchen atorvastatin (LIPITOR) 20 MG tablet Take 1 tablet (20 mg total) by mouth daily. 90 tablet 3  .  bimatoprost (LUMIGAN) 0.03 % ophthalmic solution 1 drop at bedtime.    Marland Kitchen dexlansoprazole (DEXILANT) 60 MG capsule Take 1 capsule (60 mg total) by mouth daily. 90 capsule 3  . diltiazem (CARDIZEM) 120 MG tablet Take 1 tablet (120 mg total) by mouth daily. 30 tablet 3  . fexofenadine (ALLEGRA) 180 MG tablet Take 1 tablet (180 mg total) by mouth daily. 90 tablet 3  . glucose blood (FREESTYLE LITE) test strip Pt checks BS bid - also needs lancets disp: 1 box with prn refills 100 each 11  . HYDROcodone-acetaminophen (NORCO) 10-325 MG per tablet Take 1 tablet by mouth every 6 (six) hours as needed for moderate pain.     Marland Kitchen ipratropium-albuterol (DUONEB) 0.5-2.5 (3) MG/3ML SOLN INHALE 1 VIAL VIA NEBULIZER EVERY 6 HOURS AS NEEDED 360 mL 0  . levofloxacin (LEVAQUIN) 500 MG tablet Take 1 tablet (500 mg total) by mouth daily. 7 tablet 0  .  losartan (COZAAR) 50 MG tablet Take 1 tablet (50 mg total) by mouth daily. 90 tablet 3  . metFORMIN (GLUCOPHAGE) 1000 MG tablet Take 1 tablet (1,000 mg total) by mouth 2 (two) times daily with a meal. 180 tablet 3  . mometasone (NASONEX) 50 MCG/ACT nasal spray Place 2 sprays into the nose daily. Please dispense 3 bottles for a 3 month supply (Patient taking differently: Place 2 sprays into the nose daily as needed (allergies). ) 51 g 3  . oxymorphone (OPANA ER) 20 MG 12 hr tablet Take 20 mg by mouth every 12 (twelve) hours.    . predniSONE (DELTASONE) 20 MG tablet 3 tabs poqday 1-2, 2 tabs poqday 3-4, 1 tab poqday 5-6 12 tablet 0  . rOPINIRole (REQUIP) 2 MG tablet Take 1 tablet (2 mg total) by mouth 2 (two) times daily. 180 tablet 3  . tolterodine (DETROL LA) 2 MG 24 hr capsule TAKE 1 CAPSULE (2 MG TOTAL) BY MOUTH DAILY. 90 capsule 1   No current facility-administered medications on file prior to visit.    Allergies  Allergen Reactions  . Guaifenesin & Derivatives     Severe Reaction  . Benazepril Other (See Comments)    cough  . Latex     REACTION: powder on gloves    . Morphine     REACTION: nausea  . Zyrtec [Cetirizine] Other (See Comments)    swelling   Social History   Social History  . Marital status: Married    Spouse name: N/A  . Number of children: 1  . Years of education: N/A   Occupational History  . Retired    Social History Main Topics  . Smoking status: Current Every Day Smoker    Types: Cigarettes  . Smokeless tobacco: Never Used  . Alcohol use No  . Drug use: No  . Sexual activity: Not on file   Other Topics Concern  . Not on file   Social History Narrative  . No narrative on file     Review of Systems  All other systems reviewed and are negative.      Objective:   Physical Exam  Constitutional: She appears well-developed and well-nourished.  Neck: No JVD present.  Cardiovascular: Normal rate, regular rhythm and normal heart sounds.   Pulmonary/Chest: Effort normal. No respiratory distress. She has wheezes in the right upper field, the right lower field and the left upper field. She has rales in the right upper field, the right lower field, the left upper field and the left lower field.  Abdominal: Soft. Bowel sounds are normal.  Musculoskeletal: She exhibits no edema.  Vitals reviewed.         Assessment & Plan:   COPD exacerbation  Clinically she is improving. Recheck here on Thursday. If worsening, obtain a chest x-ray and lab work. If improving, allow tincture of time

## 2016-05-19 ENCOUNTER — Ambulatory Visit (INDEPENDENT_AMBULATORY_CARE_PROVIDER_SITE_OTHER): Payer: Medicare Other | Admitting: Family Medicine

## 2016-05-19 DIAGNOSIS — Z23 Encounter for immunization: Secondary | ICD-10-CM | POA: Diagnosis not present

## 2016-06-02 DIAGNOSIS — M25512 Pain in left shoulder: Secondary | ICD-10-CM | POA: Diagnosis not present

## 2016-06-02 DIAGNOSIS — M542 Cervicalgia: Secondary | ICD-10-CM | POA: Diagnosis not present

## 2016-06-02 DIAGNOSIS — G894 Chronic pain syndrome: Secondary | ICD-10-CM | POA: Diagnosis not present

## 2016-06-02 DIAGNOSIS — M545 Low back pain: Secondary | ICD-10-CM | POA: Diagnosis not present

## 2016-06-02 DIAGNOSIS — Z79891 Long term (current) use of opiate analgesic: Secondary | ICD-10-CM | POA: Diagnosis not present

## 2016-06-02 DIAGNOSIS — M25511 Pain in right shoulder: Secondary | ICD-10-CM | POA: Diagnosis not present

## 2016-07-02 DIAGNOSIS — G894 Chronic pain syndrome: Secondary | ICD-10-CM | POA: Diagnosis not present

## 2016-07-02 DIAGNOSIS — M25511 Pain in right shoulder: Secondary | ICD-10-CM | POA: Diagnosis not present

## 2016-07-02 DIAGNOSIS — Z79891 Long term (current) use of opiate analgesic: Secondary | ICD-10-CM | POA: Diagnosis not present

## 2016-07-02 DIAGNOSIS — M5481 Occipital neuralgia: Secondary | ICD-10-CM | POA: Diagnosis not present

## 2016-07-02 DIAGNOSIS — M5412 Radiculopathy, cervical region: Secondary | ICD-10-CM | POA: Diagnosis not present

## 2016-07-02 DIAGNOSIS — G890 Central pain syndrome: Secondary | ICD-10-CM | POA: Diagnosis not present

## 2016-07-02 DIAGNOSIS — M79622 Pain in left upper arm: Secondary | ICD-10-CM | POA: Diagnosis not present

## 2016-07-02 DIAGNOSIS — M545 Low back pain: Secondary | ICD-10-CM | POA: Diagnosis not present

## 2016-07-02 DIAGNOSIS — R202 Paresthesia of skin: Secondary | ICD-10-CM | POA: Diagnosis not present

## 2016-07-02 DIAGNOSIS — M542 Cervicalgia: Secondary | ICD-10-CM | POA: Diagnosis not present

## 2016-07-02 DIAGNOSIS — M25512 Pain in left shoulder: Secondary | ICD-10-CM | POA: Diagnosis not present

## 2016-07-22 ENCOUNTER — Telehealth: Payer: Self-pay | Admitting: Family Medicine

## 2016-07-22 MED ORDER — DILTIAZEM HCL 120 MG PO TABS: 120.0000 mg | ORAL_TABLET | Freq: Every day | ORAL | 3 refills | Status: DC

## 2016-07-22 NOTE — Telephone Encounter (Signed)
Medication called/sent to requested pharmacy  

## 2016-07-22 NOTE — Telephone Encounter (Signed)
CB# [phone]  Patient needs a prescription diltiazem hlz 120mg  called in for a 30 day supply at [facility] on [address]. Patient has made an appt for med refill and 6 month follow up on Tuesday. She is completely out of this medication for a few days.

## 2016-07-29 ENCOUNTER — Encounter: Payer: Self-pay | Admitting: Family Medicine

## 2016-07-29 ENCOUNTER — Ambulatory Visit (INDEPENDENT_AMBULATORY_CARE_PROVIDER_SITE_OTHER): Payer: Medicare Other | Admitting: Family Medicine

## 2016-07-29 VITALS — BP 132/80 | HR 82 | Temp 98.3°F | Resp 18 | Ht 64.0 in | Wt 183.0 lb

## 2016-07-29 DIAGNOSIS — E1151 Type 2 diabetes mellitus with diabetic peripheral angiopathy without gangrene: Secondary | ICD-10-CM | POA: Diagnosis not present

## 2016-07-29 DIAGNOSIS — I10 Essential (primary) hypertension: Secondary | ICD-10-CM | POA: Diagnosis not present

## 2016-07-29 DIAGNOSIS — F17200 Nicotine dependence, unspecified, uncomplicated: Secondary | ICD-10-CM | POA: Diagnosis not present

## 2016-07-29 LAB — CBC WITH DIFFERENTIAL/PLATELET
BASOS PCT: 1 %
Basophils Absolute: 80 cells/uL (ref 0–200)
EOS ABS: 240 {cells}/uL (ref 15–500)
Eosinophils Relative: 3 %
HEMATOCRIT: 41.6 % (ref 35.0–45.0)
HEMOGLOBIN: 13.2 g/dL (ref 12.0–15.0)
LYMPHS ABS: 3200 {cells}/uL (ref 850–3900)
LYMPHS PCT: 40 %
MCH: 28.2 pg (ref 27.0–33.0)
MCHC: 31.7 g/dL — AB (ref 32.0–36.0)
MCV: 88.9 fL (ref 80.0–100.0)
MONO ABS: 720 {cells}/uL (ref 200–950)
MPV: 9.1 fL (ref 7.5–12.5)
Monocytes Relative: 9 %
NEUTROS PCT: 47 %
Neutro Abs: 3760 cells/uL (ref 1500–7800)
Platelets: 396 10*3/uL (ref 140–400)
RBC: 4.68 MIL/uL (ref 3.80–5.10)
RDW: 16 % — AB (ref 11.0–15.0)
WBC: 8 10*3/uL (ref 3.8–10.8)

## 2016-07-29 LAB — HEMOGLOBIN A1C
Hgb A1c MFr Bld: 6.1 % — ABNORMAL HIGH (ref <5.7)
MEAN PLASMA GLUCOSE: 128 mg/dL

## 2016-07-29 MED ORDER — METFORMIN HCL 1000 MG PO TABS: 1000.0000 mg | ORAL_TABLET | Freq: Two times a day (BID) | ORAL | 3 refills | Status: DC

## 2016-07-29 MED ORDER — FEXOFENADINE HCL 180 MG PO TABS: 180.0000 mg | ORAL_TABLET | Freq: Every day | ORAL | 3 refills | Status: DC

## 2016-07-29 MED ORDER — TOLTERODINE TARTRATE ER 2 MG PO CP24: ORAL_CAPSULE | ORAL | 1 refills | Status: DC

## 2016-07-29 MED ORDER — ASPIRIN 325 MG PO TABS: 325.0000 mg | ORAL_TABLET | Freq: Every day | ORAL | 3 refills | Status: DC

## 2016-07-29 MED ORDER — ALBUTEROL SULFATE HFA 108 (90 BASE) MCG/ACT IN AERS: 2.0000 | INHALATION_SPRAY | RESPIRATORY_TRACT | 1 refills | Status: DC | PRN

## 2016-07-29 MED ORDER — IPRATROPIUM-ALBUTEROL 0.5-2.5 (3) MG/3ML IN SOLN: RESPIRATORY_TRACT | 0 refills | Status: DC

## 2016-07-29 MED ORDER — ROPINIROLE HCL 2 MG PO TABS: 2.0000 mg | ORAL_TABLET | Freq: Two times a day (BID) | ORAL | 3 refills | Status: DC

## 2016-07-29 MED ORDER — LOSARTAN POTASSIUM 50 MG PO TABS: 50.0000 mg | ORAL_TABLET | Freq: Every day | ORAL | 3 refills | Status: DC

## 2016-07-29 MED ORDER — DILTIAZEM HCL 120 MG PO TABS: 120.0000 mg | ORAL_TABLET | Freq: Every day | ORAL | 3 refills | Status: DC

## 2016-07-29 MED ORDER — ATORVASTATIN CALCIUM 20 MG PO TABS: 20.0000 mg | ORAL_TABLET | Freq: Every day | ORAL | 3 refills | Status: DC

## 2016-07-29 MED ORDER — DEXLANSOPRAZOLE 60 MG PO CPDR: 60.0000 mg | DELAYED_RELEASE_CAPSULE | Freq: Every day | ORAL | 3 refills | Status: DC

## 2016-07-29 MED ORDER — CYCLOBENZAPRINE HCL 10 MG PO TABS: 10.0000 mg | ORAL_TABLET | Freq: Three times a day (TID) | ORAL | 0 refills | Status: DC | PRN

## 2016-07-29 MED ORDER — MOMETASONE FUROATE 50 MCG/ACT NA SUSP: 2.0000 | Freq: Every day | NASAL | 3 refills | Status: DC

## 2016-07-29 NOTE — Progress Notes (Signed)
Subjective:    Patient ID: [name] [name], female    DOB: 1950/10/14, 65 y.o.   MRN: [id number]  HPI Patient is a very pleasant 65 year old white female here today for follow-up of her chronic medical conditions. She is a history of coronary artery disease status post angioplasty. She did not have a stent. She is currently on aspirin. Unfortunately she continues to smoke a pack of cigarettes per day. She has no desire to quit. Her immunizations are up-to-date. I reviewed her entire shot record. Her blood pressure today is well controlled. She denies any chest pain shortness of breath or dyspnea on exertion. Her last hemoglobin A1c was 6.2 in May. She denies any polyuria, polydipsia, or blurred vision. She does have some mild numbness in between her toes on her feet but really denies any significant neuropathy. She denies any hypoglycemic episodes. She denies any myalgias or right upper quadrant pain. She is currently on Lipitor 20 mg a day. Her last LDL cholesterol was excellent at 87. However given her history of coronary artery disease this is not at the new goals. Past Medical History:  Diagnosis Date  . Anxiety   . Arthritis   . CAD (coronary artery disease)   . Chronic kidney disease    Renal insufficiency  . Depression   . Diabetes mellitus   . Diverticulosis of colon (without mention of hemorrhage)   . Duodenitis without mention of hemorrhage   . GERD (gastroesophageal reflux disease)   . Hemorrhoids   . Hypercholesteremia   . Hypertension   . Insomnia   . Tinnitus   . Vertigo    Past Surgical History:  Procedure Laterality Date  . ANGIOPLASTY    . CHOLECYSTECTOMY    . ROTATOR CUFF REPAIR Right 04/2012  . TUBAL LIGATION     Current Outpatient Prescriptions on File Prior to Visit  Medication Sig Dispense Refill  . bimatoprost (LUMIGAN) 0.03 % ophthalmic solution 1 drop at bedtime.    Marland Kitchen glucose blood (FREESTYLE LITE) test strip Pt checks BS bid - also needs lancets disp: 1  box with prn refills 100 each 11  . HYDROcodone-acetaminophen (NORCO) 10-325 MG per tablet Take 1 tablet by mouth every 6 (six) hours as needed for moderate pain.     Marland Kitchen oxymorphone (OPANA ER) 20 MG 12 hr tablet Take 20 mg by mouth every 12 (twelve) hours.    Marland Kitchen zolpidem (AMBIEN) 10 MG tablet Take 1 tablet (10 mg total) by mouth at bedtime as needed for sleep. 15 tablet 1   No current facility-administered medications on file prior to visit.    Allergies  Allergen Reactions  . Guaifenesin & Derivatives     Severe Reaction  . Benazepril Other (See Comments)    cough  . Latex     REACTION: powder on gloves  . Morphine     REACTION: nausea  . Zyrtec [Cetirizine] Other (See Comments)    swelling   Social History   Social History  . Marital status: Married    Spouse name: N/A  . Number of children: 1  . Years of education: N/A   Occupational History  . Retired    Social History Main Topics  . Smoking status: Current Every Day Smoker    Types: Cigarettes  . Smokeless tobacco: Never Used  . Alcohol use No  . Drug use: No  . Sexual activity: Not on file   Other Topics Concern  . Not on file   Social History  Narrative  . No narrative on file   Family History  Problem Relation Age of Onset  . Diabetes Mother   . Hemophilia Mother   . Diabetes Brother   . Breast cancer Sister   . Heart disease Father   . Colon polyps Father   . Colon polyps Sister       Review of Systems  All other systems reviewed and are negative.      Objective:   Physical Exam  Constitutional: She is oriented to person, place, and time. She appears well-developed and well-nourished. No distress.  Neck: Neck supple. No JVD present. No thyromegaly present.  Cardiovascular: Normal rate, regular rhythm, normal heart sounds and intact distal pulses.   No murmur heard. Pulmonary/Chest: Effort normal and breath sounds normal. No respiratory distress. She has no wheezes. She has no rales.    Abdominal: Soft. Bowel sounds are normal. She exhibits no distension. There is no tenderness. There is no rebound and no guarding.  Musculoskeletal: She exhibits no edema.  Lymphadenopathy:    She has no cervical adenopathy.  Neurological: She is alert and oriented to person, place, and time. She exhibits normal muscle tone. Coordination normal.  Skin: She is not diaphoretic.  Vitals reviewed.         Assessment & Plan:  DM (diabetes mellitus) type II controlled peripheral vascular disorder ([place]) - Plan: CBC with Differential/Platelet, COMPLETE METABOLIC PANEL WITH GFR, Lipid panel, Hemoglobin A1c, Microalbumin, urine  Essential hypertension  TOBACCO ABUSE  Blood pressure is at goal. Check hemoglobin A1c. Goal hemoglobin A1c is less than 6.5. Check urine microalbumin. Immunizations are up-to-date. Check fasting lipid panel. Goal LDL cholesterol is less than 55. Therefore I believe we will increase her Lipitor to 40 mg a day unless lab work suggests otherwise. Strongly counseled smoking cessation. Diabetic foot exam was performed. Recommended annual diabetic eye exam

## 2016-07-30 LAB — LIPID PANEL
CHOLESTEROL: 104 mg/dL (ref <200)
HDL: 44 mg/dL — ABNORMAL LOW (ref >50)
LDL Cholesterol: 42 mg/dL (ref <100)
TRIGLYCERIDES: 88 mg/dL (ref <150)
Total CHOL/HDL Ratio: 2.4 Ratio (ref <5.0)
VLDL: 18 mg/dL (ref <30)

## 2016-07-30 LAB — COMPLETE METABOLIC PANEL WITH GFR
ALBUMIN: 4 g/dL (ref 3.6–5.1)
ALK PHOS: 36 U/L (ref 33–130)
ALT: 16 U/L (ref 6–29)
AST: 17 U/L (ref 10–35)
BUN: 11 mg/dL (ref 7–25)
CALCIUM: 9.5 mg/dL (ref 8.6–10.4)
CO2: 27 mmol/L (ref 20–31)
CREATININE: 0.78 mg/dL (ref 0.50–0.99)
Chloride: 99 mmol/L (ref 98–110)
GFR, Est Non African American: 79 mL/min (ref >=60)
Glucose, Bld: 113 mg/dL — ABNORMAL HIGH (ref 70–99)
Potassium: 4.9 mmol/L (ref 3.5–5.3)
Sodium: 137 mmol/L (ref 135–146)
Total Bilirubin: 0.3 mg/dL (ref 0.2–1.2)
Total Protein: 7.2 g/dL (ref 6.1–8.1)

## 2016-07-30 LAB — MICROALBUMIN, URINE: MICROALB UR: 0.4 mg/dL

## 2016-08-01 DIAGNOSIS — M25511 Pain in right shoulder: Secondary | ICD-10-CM | POA: Diagnosis not present

## 2016-08-01 DIAGNOSIS — M545 Low back pain: Secondary | ICD-10-CM | POA: Diagnosis not present

## 2016-08-01 DIAGNOSIS — G4733 Obstructive sleep apnea (adult) (pediatric): Secondary | ICD-10-CM | POA: Diagnosis not present

## 2016-08-01 DIAGNOSIS — Z79891 Long term (current) use of opiate analgesic: Secondary | ICD-10-CM | POA: Diagnosis not present

## 2016-08-01 DIAGNOSIS — M25512 Pain in left shoulder: Secondary | ICD-10-CM | POA: Diagnosis not present

## 2016-08-01 DIAGNOSIS — G894 Chronic pain syndrome: Secondary | ICD-10-CM | POA: Diagnosis not present

## 2016-08-22 DIAGNOSIS — H40053 Ocular hypertension, bilateral: Secondary | ICD-10-CM | POA: Diagnosis not present

## 2016-09-01 DIAGNOSIS — G894 Chronic pain syndrome: Secondary | ICD-10-CM | POA: Diagnosis not present

## 2016-09-01 DIAGNOSIS — M542 Cervicalgia: Secondary | ICD-10-CM | POA: Diagnosis not present

## 2016-09-01 DIAGNOSIS — G4733 Obstructive sleep apnea (adult) (pediatric): Secondary | ICD-10-CM | POA: Diagnosis not present

## 2016-09-01 DIAGNOSIS — M545 Low back pain: Secondary | ICD-10-CM | POA: Diagnosis not present

## 2016-09-01 DIAGNOSIS — M25512 Pain in left shoulder: Secondary | ICD-10-CM | POA: Diagnosis not present

## 2016-09-01 DIAGNOSIS — Z79891 Long term (current) use of opiate analgesic: Secondary | ICD-10-CM | POA: Diagnosis not present

## 2016-09-01 DIAGNOSIS — M25511 Pain in right shoulder: Secondary | ICD-10-CM | POA: Diagnosis not present

## 2016-09-16 ENCOUNTER — Other Ambulatory Visit (HOSPITAL_BASED_OUTPATIENT_CLINIC_OR_DEPARTMENT_OTHER): Payer: Self-pay

## 2016-09-16 DIAGNOSIS — R5383 Other fatigue: Secondary | ICD-10-CM

## 2016-09-16 DIAGNOSIS — R0683 Snoring: Secondary | ICD-10-CM

## 2016-09-16 DIAGNOSIS — G4710 Hypersomnia, unspecified: Secondary | ICD-10-CM

## 2016-09-17 ENCOUNTER — Other Ambulatory Visit: Payer: Self-pay | Admitting: Family Medicine

## 2016-09-26 DIAGNOSIS — Z79891 Long term (current) use of opiate analgesic: Secondary | ICD-10-CM | POA: Diagnosis not present

## 2016-09-29 ENCOUNTER — Ambulatory Visit: Payer: Medicare Other | Attending: Nurse Practitioner | Admitting: Neurology

## 2016-09-29 DIAGNOSIS — G4710 Hypersomnia, unspecified: Secondary | ICD-10-CM | POA: Insufficient documentation

## 2016-09-29 DIAGNOSIS — M545 Low back pain: Secondary | ICD-10-CM | POA: Diagnosis not present

## 2016-09-29 DIAGNOSIS — Z79891 Long term (current) use of opiate analgesic: Secondary | ICD-10-CM | POA: Diagnosis not present

## 2016-09-29 DIAGNOSIS — M542 Cervicalgia: Secondary | ICD-10-CM | POA: Diagnosis not present

## 2016-09-29 DIAGNOSIS — M25512 Pain in left shoulder: Secondary | ICD-10-CM | POA: Diagnosis not present

## 2016-09-29 DIAGNOSIS — R0683 Snoring: Secondary | ICD-10-CM | POA: Diagnosis not present

## 2016-09-29 DIAGNOSIS — G4733 Obstructive sleep apnea (adult) (pediatric): Secondary | ICD-10-CM | POA: Diagnosis not present

## 2016-09-29 DIAGNOSIS — M25511 Pain in right shoulder: Secondary | ICD-10-CM | POA: Diagnosis not present

## 2016-09-29 DIAGNOSIS — R5383 Other fatigue: Secondary | ICD-10-CM | POA: Diagnosis not present

## 2016-09-29 DIAGNOSIS — G894 Chronic pain syndrome: Secondary | ICD-10-CM | POA: Diagnosis not present

## 2016-10-04 NOTE — Procedures (Signed)
[place] A. Merlene Laughter, MD     [email or web address]             NOCTURNAL POLYSOMNOGRAPHY   LOCATION: [facility]  Patient Name: [name] [name], [name] [name] Date: 09/29/2016 Gender: Female D.O.B: 1951-03-10 Age (years): 65 Referring Provider: Joyce Copa Height (inches): 64 Interpreting Physician: Phillips Odor MD, ABSM Weight (lbs): 175 RPSGT: Rosebud Poles BMI: 30 MRN: [id number] Neck Size: 16.00 CLINICAL INFORMATION Sleep Study Type: NPSG  Indication for sleep study: Fatigue, Snoring  Epworth Sleepiness Score: 2  SLEEP STUDY TECHNIQUE As per the AASM Manual for the Scoring of Sleep and Associated Events v2.3 (April 2016) with a hypopnea requiring 4% desaturations.  The channels recorded and monitored were frontal, central and occipital EEG, electrooculogram (EOG), submentalis EMG (chin), nasal and oral airflow, thoracic and abdominal wall motion, anterior tibialis EMG, snore microphone, electrocardiogram, and pulse oximetry.  MEDICATIONS Medications self-administered by patient taken the night of the study : N/A  Current Outpatient Prescriptions:  .  albuterol (PROVENTIL HFA;VENTOLIN HFA) 108 (90 Base) MCG/ACT inhaler, Inhale 2 puffs into the lungs every 4 (four) hours as needed for wheezing or shortness of breath., Disp: 3 Inhaler, Rfl: 1 .  aspirin 325 MG tablet, Take 1 tablet (325 mg total) by mouth daily., Disp: 90 tablet, Rfl: 3 .  atorvastatin (LIPITOR) 20 MG tablet, Take 1 tablet (20 mg total) by mouth daily., Disp: 90 tablet, Rfl: 3 .  bimatoprost (LUMIGAN) 0.03 % ophthalmic solution, 1 drop at bedtime., Disp: , Rfl:  .  cyclobenzaprine (FLEXERIL) 10 MG tablet, Take 1 tablet (10 mg total) by mouth 3 (three) times daily as needed for muscle spasms., Disp: 90 tablet, Rfl: 0 .  dexlansoprazole (DEXILANT) 60 MG capsule, Take 1 capsule (60 mg total) by mouth daily., Disp: 90 capsule, Rfl: 3 .  diltiazem (CARDIZEM) 120 MG tablet, Take 1 tablet (120  mg total) by mouth daily., Disp: 90 tablet, Rfl: 3 .  fexofenadine (ALLEGRA) 180 MG tablet, Take 1 tablet (180 mg total) by mouth daily., Disp: 90 tablet, Rfl: 3 .  glucose blood (FREESTYLE LITE) test strip, Pt checks BS bid - also needs lancets disp: 1 box with prn refills, Disp: 100 each, Rfl: 11 .  HYDROcodone-acetaminophen (NORCO) 10-325 MG per tablet, Take 1 tablet by mouth every 6 (six) hours as needed for moderate pain. , Disp: , Rfl:  .  ipratropium-albuterol (DUONEB) 0.5-2.5 (3) MG/3ML SOLN, INHALE 1 VIAL VIA NEBULIZER EVERY 6 HOURS AS NEEDED, Disp: 360 mL, Rfl: 0 .  losartan (COZAAR) 50 MG tablet, Take 1 tablet (50 mg total) by mouth daily., Disp: 90 tablet, Rfl: 3 .  metFORMIN (GLUCOPHAGE) 1000 MG tablet, Take 1 tablet (1,000 mg total) by mouth 2 (two) times daily with a meal., Disp: 180 tablet, Rfl: 3 .  mometasone (NASONEX) 50 MCG/ACT nasal spray, Place 2 sprays into the nose daily. Please dispense 3 bottles for a 3 month supply, Disp: 51 g, Rfl: 3 .  oxymorphone (OPANA ER) 20 MG 12 hr tablet, Take 20 mg by mouth every 12 (twelve) hours., Disp: , Rfl:  .  rOPINIRole (REQUIP) 2 MG tablet, TAKE 1 TABLET BY MOUTH AT BEDTIME., Disp: 90 tablet, Rfl: 2 .  tolterodine (DETROL LA) 2 MG 24 hr capsule, TAKE 1 CAPSULE (2 MG TOTAL) BY MOUTH DAILY., Disp: 90 capsule, Rfl: 1 .  zolpidem (AMBIEN) 10 MG tablet, Take 1 tablet (10 mg total) by mouth at bedtime as needed for sleep., Disp: 15 tablet, Rfl: 1  SLEEP ARCHITECTURE The study was initiated at 9:51:35 PM and ended at 4:41:41 AM.  Sleep onset time was 54.8 minutes and the sleep efficiency was 78.4%. The total sleep time was 321.3 minutes.  Stage REM latency was 264.0 minutes.  The patient spent 2.33% of the night in stage N1 sleep, 85.42% in stage N2 sleep, 11.78% in stage N3 and 0.47% in REM.  Alpha intrusion was absent.  Supine sleep was 15.40%.  RESPIRATORY PARAMETERS The overall apnea/hypopnea index (AHI) was 2.2 per hour. There  were 5 total apneas, including 5 obstructive, 0 central and 0 mixed apneas. There were 7 hypopneas and 21 RERAs.  The AHI during Stage REM sleep was 0.0 per hour.  AHI while supine was 7.3 per hour.  The mean oxygen saturation was 88.64%. The minimum SpO2 during sleep was 85.00%.  Moderate snoring was noted during this study.  CARDIAC DATA The 2 lead EKG demonstrated sinus rhythm. The mean heart rate was N/A beats per minute. Other EKG findings include: None. LEG MOVEMENT DATA The total PLMS were 0 with a resulting PLMS index of 0.00. Associated arousal with leg movement index was 0.0.  IMPRESSIONS - No significant obstructive sleep apnea occurred during this study. - Clinically significant periodic limb movements did not occur during sleep. No significant associated arousals.   Delano Metz, MD Diplomate, American Board of Sleep Medicine.

## 2016-10-06 NOTE — Progress Notes (Signed)
Good news, sleep study shows no significant sleep apnea.

## 2016-10-07 NOTE — Progress Notes (Signed)
Patient aware of results.

## 2016-11-04 DIAGNOSIS — G894 Chronic pain syndrome: Secondary | ICD-10-CM | POA: Diagnosis not present

## 2016-11-04 DIAGNOSIS — M542 Cervicalgia: Secondary | ICD-10-CM | POA: Diagnosis not present

## 2016-11-04 DIAGNOSIS — Z79891 Long term (current) use of opiate analgesic: Secondary | ICD-10-CM | POA: Diagnosis not present

## 2016-11-04 DIAGNOSIS — M25512 Pain in left shoulder: Secondary | ICD-10-CM | POA: Diagnosis not present

## 2016-11-04 DIAGNOSIS — M545 Low back pain: Secondary | ICD-10-CM | POA: Diagnosis not present

## 2016-11-04 DIAGNOSIS — M25511 Pain in right shoulder: Secondary | ICD-10-CM | POA: Diagnosis not present

## 2016-11-18 DIAGNOSIS — H40053 Ocular hypertension, bilateral: Secondary | ICD-10-CM | POA: Diagnosis not present

## 2016-11-18 DIAGNOSIS — H40013 Open angle with borderline findings, low risk, bilateral: Secondary | ICD-10-CM | POA: Diagnosis not present

## 2016-12-03 DIAGNOSIS — G890 Central pain syndrome: Secondary | ICD-10-CM | POA: Diagnosis not present

## 2016-12-03 DIAGNOSIS — M5481 Occipital neuralgia: Secondary | ICD-10-CM | POA: Diagnosis not present

## 2016-12-03 DIAGNOSIS — G894 Chronic pain syndrome: Secondary | ICD-10-CM | POA: Diagnosis not present

## 2016-12-03 DIAGNOSIS — Z79891 Long term (current) use of opiate analgesic: Secondary | ICD-10-CM | POA: Diagnosis not present

## 2016-12-03 DIAGNOSIS — M5412 Radiculopathy, cervical region: Secondary | ICD-10-CM | POA: Diagnosis not present

## 2016-12-03 DIAGNOSIS — M79622 Pain in left upper arm: Secondary | ICD-10-CM | POA: Diagnosis not present

## 2016-12-03 DIAGNOSIS — G4733 Obstructive sleep apnea (adult) (pediatric): Secondary | ICD-10-CM | POA: Diagnosis not present

## 2017-01-02 DIAGNOSIS — Z79891 Long term (current) use of opiate analgesic: Secondary | ICD-10-CM | POA: Diagnosis not present

## 2017-01-02 DIAGNOSIS — M542 Cervicalgia: Secondary | ICD-10-CM | POA: Diagnosis not present

## 2017-01-02 DIAGNOSIS — G894 Chronic pain syndrome: Secondary | ICD-10-CM | POA: Diagnosis not present

## 2017-01-02 DIAGNOSIS — M545 Low back pain: Secondary | ICD-10-CM | POA: Diagnosis not present

## 2017-01-02 DIAGNOSIS — M25511 Pain in right shoulder: Secondary | ICD-10-CM | POA: Diagnosis not present

## 2017-01-02 DIAGNOSIS — M25512 Pain in left shoulder: Secondary | ICD-10-CM | POA: Diagnosis not present

## 2017-01-29 DIAGNOSIS — M79622 Pain in left upper arm: Secondary | ICD-10-CM | POA: Diagnosis not present

## 2017-01-29 DIAGNOSIS — G894 Chronic pain syndrome: Secondary | ICD-10-CM | POA: Diagnosis not present

## 2017-01-29 DIAGNOSIS — M5481 Occipital neuralgia: Secondary | ICD-10-CM | POA: Diagnosis not present

## 2017-01-29 DIAGNOSIS — M5412 Radiculopathy, cervical region: Secondary | ICD-10-CM | POA: Diagnosis not present

## 2017-01-29 DIAGNOSIS — G4733 Obstructive sleep apnea (adult) (pediatric): Secondary | ICD-10-CM | POA: Diagnosis not present

## 2017-02-10 ENCOUNTER — Other Ambulatory Visit: Payer: Self-pay | Admitting: Family Medicine

## 2017-02-10 DIAGNOSIS — Z1231 Encounter for screening mammogram for malignant neoplasm of breast: Secondary | ICD-10-CM

## 2017-02-24 DIAGNOSIS — H40053 Ocular hypertension, bilateral: Secondary | ICD-10-CM | POA: Diagnosis not present

## 2017-03-04 DIAGNOSIS — M542 Cervicalgia: Secondary | ICD-10-CM | POA: Diagnosis not present

## 2017-03-04 DIAGNOSIS — G894 Chronic pain syndrome: Secondary | ICD-10-CM | POA: Diagnosis not present

## 2017-03-04 DIAGNOSIS — M545 Low back pain: Secondary | ICD-10-CM | POA: Diagnosis not present

## 2017-03-04 DIAGNOSIS — Z79891 Long term (current) use of opiate analgesic: Secondary | ICD-10-CM | POA: Diagnosis not present

## 2017-03-04 DIAGNOSIS — M25512 Pain in left shoulder: Secondary | ICD-10-CM | POA: Diagnosis not present

## 2017-03-04 DIAGNOSIS — M25511 Pain in right shoulder: Secondary | ICD-10-CM | POA: Diagnosis not present

## 2017-03-13 ENCOUNTER — Ambulatory Visit (HOSPITAL_COMMUNITY)
Admission: RE | Admit: 2017-03-13 | Discharge: 2017-03-13 | Disposition: A | Discharge status: Home / Self Care | Payer: Medicare Other | Source: Ambulatory Visit | Attending: Family Medicine | Admitting: Family Medicine

## 2017-03-13 DIAGNOSIS — Z1231 Encounter for screening mammogram for malignant neoplasm of breast: Secondary | ICD-10-CM | POA: Insufficient documentation

## 2017-04-01 ENCOUNTER — Other Ambulatory Visit: Payer: Self-pay | Admitting: Family Medicine

## 2017-04-01 MED ORDER — TOLTERODINE TARTRATE ER 2 MG PO CP24: ORAL_CAPSULE | ORAL | 1 refills | Status: DC

## 2017-04-03 DIAGNOSIS — M25512 Pain in left shoulder: Secondary | ICD-10-CM | POA: Diagnosis not present

## 2017-04-03 DIAGNOSIS — M545 Low back pain: Secondary | ICD-10-CM | POA: Diagnosis not present

## 2017-04-03 DIAGNOSIS — Z79891 Long term (current) use of opiate analgesic: Secondary | ICD-10-CM | POA: Diagnosis not present

## 2017-04-03 DIAGNOSIS — M25511 Pain in right shoulder: Secondary | ICD-10-CM | POA: Diagnosis not present

## 2017-04-03 DIAGNOSIS — M542 Cervicalgia: Secondary | ICD-10-CM | POA: Diagnosis not present

## 2017-04-03 DIAGNOSIS — G894 Chronic pain syndrome: Secondary | ICD-10-CM | POA: Diagnosis not present

## 2017-04-28 DIAGNOSIS — M79622 Pain in left upper arm: Secondary | ICD-10-CM | POA: Diagnosis not present

## 2017-04-28 DIAGNOSIS — M5481 Occipital neuralgia: Secondary | ICD-10-CM | POA: Diagnosis not present

## 2017-04-28 DIAGNOSIS — M5412 Radiculopathy, cervical region: Secondary | ICD-10-CM | POA: Diagnosis not present

## 2017-04-28 DIAGNOSIS — G894 Chronic pain syndrome: Secondary | ICD-10-CM | POA: Diagnosis not present

## 2017-04-28 DIAGNOSIS — G4733 Obstructive sleep apnea (adult) (pediatric): Secondary | ICD-10-CM | POA: Diagnosis not present

## 2017-05-12 DIAGNOSIS — H35033 Hypertensive retinopathy, bilateral: Secondary | ICD-10-CM | POA: Diagnosis not present

## 2017-05-26 DIAGNOSIS — G4733 Obstructive sleep apnea (adult) (pediatric): Secondary | ICD-10-CM | POA: Diagnosis not present

## 2017-05-26 DIAGNOSIS — M5481 Occipital neuralgia: Secondary | ICD-10-CM | POA: Diagnosis not present

## 2017-05-26 DIAGNOSIS — M5412 Radiculopathy, cervical region: Secondary | ICD-10-CM | POA: Diagnosis not present

## 2017-05-26 DIAGNOSIS — M79622 Pain in left upper arm: Secondary | ICD-10-CM | POA: Diagnosis not present

## 2017-05-26 DIAGNOSIS — G894 Chronic pain syndrome: Secondary | ICD-10-CM | POA: Diagnosis not present

## 2017-06-13 ENCOUNTER — Other Ambulatory Visit: Payer: Self-pay | Admitting: Family Medicine

## 2017-06-29 DIAGNOSIS — G894 Chronic pain syndrome: Secondary | ICD-10-CM | POA: Diagnosis not present

## 2017-06-29 DIAGNOSIS — G4733 Obstructive sleep apnea (adult) (pediatric): Secondary | ICD-10-CM | POA: Diagnosis not present

## 2017-06-29 DIAGNOSIS — M5481 Occipital neuralgia: Secondary | ICD-10-CM | POA: Diagnosis not present

## 2017-06-29 DIAGNOSIS — M79622 Pain in left upper arm: Secondary | ICD-10-CM | POA: Diagnosis not present

## 2017-06-29 DIAGNOSIS — M5412 Radiculopathy, cervical region: Secondary | ICD-10-CM | POA: Diagnosis not present

## 2017-07-29 DIAGNOSIS — M25512 Pain in left shoulder: Secondary | ICD-10-CM | POA: Diagnosis not present

## 2017-07-29 DIAGNOSIS — M542 Cervicalgia: Secondary | ICD-10-CM | POA: Diagnosis not present

## 2017-07-29 DIAGNOSIS — G894 Chronic pain syndrome: Secondary | ICD-10-CM | POA: Diagnosis not present

## 2017-07-29 DIAGNOSIS — M545 Low back pain: Secondary | ICD-10-CM | POA: Diagnosis not present

## 2017-07-29 DIAGNOSIS — M25511 Pain in right shoulder: Secondary | ICD-10-CM | POA: Diagnosis not present

## 2017-08-10 ENCOUNTER — Encounter: Payer: Self-pay | Admitting: Family Medicine

## 2017-08-10 ENCOUNTER — Ambulatory Visit (INDEPENDENT_AMBULATORY_CARE_PROVIDER_SITE_OTHER): Payer: Medicare Other | Admitting: Family Medicine

## 2017-08-10 VITALS — BP 154/70 | HR 94 | Temp 98.2°F | Resp 18 | Ht 64.0 in | Wt 179.0 lb

## 2017-08-10 DIAGNOSIS — E7800 Pure hypercholesterolemia, unspecified: Secondary | ICD-10-CM

## 2017-08-10 DIAGNOSIS — I10 Essential (primary) hypertension: Secondary | ICD-10-CM

## 2017-08-10 DIAGNOSIS — F17200 Nicotine dependence, unspecified, uncomplicated: Secondary | ICD-10-CM

## 2017-08-10 DIAGNOSIS — Z23 Encounter for immunization: Secondary | ICD-10-CM | POA: Diagnosis not present

## 2017-08-10 DIAGNOSIS — R0609 Other forms of dyspnea: Secondary | ICD-10-CM

## 2017-08-10 DIAGNOSIS — E119 Type 2 diabetes mellitus without complications: Secondary | ICD-10-CM

## 2017-08-10 MED ORDER — DILTIAZEM HCL 120 MG PO TABS: 120.0000 mg | ORAL_TABLET | Freq: Every day | ORAL | 3 refills | Status: DC

## 2017-08-10 MED ORDER — DILTIAZEM HCL 120 MG PO TABS: 120.0000 mg | ORAL_TABLET | Freq: Every day | ORAL | 0 refills | Status: DC

## 2017-08-10 MED ORDER — CYCLOBENZAPRINE HCL 10 MG PO TABS: 10.0000 mg | ORAL_TABLET | Freq: Three times a day (TID) | ORAL | 2 refills | Status: DC | PRN

## 2017-08-10 MED ORDER — DEXLANSOPRAZOLE 60 MG PO CPDR: 60.0000 mg | DELAYED_RELEASE_CAPSULE | Freq: Every day | ORAL | 3 refills | Status: DC

## 2017-08-10 MED ORDER — ATORVASTATIN CALCIUM 20 MG PO TABS: 20.0000 mg | ORAL_TABLET | Freq: Every day | ORAL | 3 refills | Status: DC

## 2017-08-10 MED ORDER — TOLTERODINE TARTRATE ER 2 MG PO CP24: ORAL_CAPSULE | ORAL | 3 refills | Status: DC

## 2017-08-10 MED ORDER — METFORMIN HCL 1000 MG PO TABS: 1000.0000 mg | ORAL_TABLET | Freq: Two times a day (BID) | ORAL | 3 refills | Status: DC

## 2017-08-10 MED ORDER — ROPINIROLE HCL 2 MG PO TABS: 2.0000 mg | ORAL_TABLET | Freq: Every day | ORAL | 3 refills | Status: DC

## 2017-08-10 MED ORDER — LOSARTAN POTASSIUM 50 MG PO TABS: 50.0000 mg | ORAL_TABLET | Freq: Every day | ORAL | 3 refills | Status: DC

## 2017-08-10 NOTE — Addendum Note (Signed)
Addended by: Shary Decamp B on: 08/10/2017 10:31 AM   Modules accepted: Orders

## 2017-08-10 NOTE — Progress Notes (Signed)
Subjective:    Patient ID: [name] [name], female    DOB: Oct 15, 1950, 66 y.o.   MRN: [id number]  HPI Patient is a very pleasant 66 year old white female here today for follow-up of her chronic medical conditions. Has not been seen since 07/2016.  She is a history of coronary artery disease status post angioplasty.  She is currently on aspirin. Unfortunately she continues to smoke a pack of cigarettes per day. She does report dyspnea on exertion. She denies any chest pain or angina. I suspect this may be due to underlying emphysema secondary to smoking as I explained to the patient. However on examination today, the patient has a new 2/6 systolic ejection murmur heard best over the aortic valve. Given her other medical issues, congestive heart failure could also be a potential cause for dyspnea on exertion. I have recommended an echocardiogram. She is due today for a flu shot. Her blood pressures out of control but she recently ran out of her Cardizem 5 days ago. She is not checking her blood sugar. She checked it this morning it was 137. She denies any polyuria, polydipsia, blurred vision. She denies any numbness or tingling in her feet. Diabetic foot exam was performed today. Past Medical History:  Diagnosis Date  . Anxiety   . Arthritis   . CAD (coronary artery disease)   . Chronic kidney disease    Renal insufficiency  . Depression   . Diabetes mellitus   . Diverticulosis of colon (without mention of hemorrhage)   . Duodenitis without mention of hemorrhage   . GERD (gastroesophageal reflux disease)   . Hemorrhoids   . Hypercholesteremia   . Hypertension   . Insomnia   . Tinnitus   . Vertigo    Past Surgical History:  Procedure Laterality Date  . ANGIOPLASTY    . CHOLECYSTECTOMY    . ROTATOR CUFF REPAIR Right 04/2012  . TUBAL LIGATION     Current Outpatient Medications on File Prior to Visit  Medication Sig Dispense Refill  . albuterol (PROVENTIL HFA;VENTOLIN HFA) 108 (90 Base)  MCG/ACT inhaler Inhale 2 puffs into the lungs every 4 (four) hours as needed for wheezing or shortness of breath. 3 Inhaler 1  . aspirin 325 MG tablet Take 1 tablet (325 mg total) by mouth daily. 90 tablet 3  . atorvastatin (LIPITOR) 20 MG tablet Take 1 tablet (20 mg total) by mouth daily. 90 tablet 3  . bimatoprost (LUMIGAN) 0.03 % ophthalmic solution 1 drop at bedtime.    . cyclobenzaprine (FLEXERIL) 10 MG tablet Take 1 tablet (10 mg total) by mouth 3 (three) times daily as needed for muscle spasms. 90 tablet 0  . dexlansoprazole (DEXILANT) 60 MG capsule Take 1 capsule (60 mg total) by mouth daily. 90 capsule 3  . diltiazem (CARDIZEM) 120 MG tablet Take 1 tablet (120 mg total) by mouth daily. 90 tablet 3  . glucose blood (FREESTYLE LITE) test strip Pt checks BS bid - also needs lancets disp: 1 box with prn refills 100 each 11  . HYDROcodone-acetaminophen (NORCO) 10-325 MG per tablet Take 1 tablet by mouth every 6 (six) hours as needed for moderate pain.     Marland Kitchen ipratropium-albuterol (DUONEB) 0.5-2.5 (3) MG/3ML SOLN INHALE 1 VIAL VIA NEBULIZER EVERY 6 HOURS AS NEEDED 360 mL 0  . losartan (COZAAR) 50 MG tablet Take 1 tablet (50 mg total) by mouth daily. 90 tablet 3  . metFORMIN (GLUCOPHAGE) 1000 MG tablet Take 1 tablet (1,000 mg total) by mouth  2 (two) times daily with a meal. 180 tablet 3  . mometasone (NASONEX) 50 MCG/ACT nasal spray Place 2 sprays into the nose daily. Please dispense 3 bottles for a 3 month supply 51 g 3  . oxymorphone (OPANA ER) 20 MG 12 hr tablet Take 20 mg by mouth every 12 (twelve) hours.    Marland Kitchen rOPINIRole (REQUIP) 2 MG tablet TAKE 1 TABLET BY MOUTH AT BEDTIME. 90 tablet 3  . tolterodine (DETROL LA) 2 MG 24 hr capsule TAKE 1 CAPSULE (2 MG TOTAL) BY MOUTH DAILY. 90 capsule 1  . zolpidem (AMBIEN) 10 MG tablet Take 1 tablet (10 mg total) by mouth at bedtime as needed for sleep. 15 tablet 1   No current facility-administered medications on file prior to visit.    Allergies    Allergen Reactions  . Guaifenesin & Derivatives     Severe Reaction  . Benazepril Other (See Comments)    cough  . Latex     REACTION: powder on gloves  . Morphine     REACTION: nausea  . Zyrtec [Cetirizine] Other (See Comments)    swelling   Social History   Socioeconomic History  . Marital status: Married    Spouse name: Not on file  . Number of children: 1  . Years of education: Not on file  . Highest education level: Not on file  Social Needs  . Financial resource strain: Not on file  . Food insecurity - worry: Not on file  . Food insecurity - inability: Not on file  . Transportation needs - medical: Not on file  . Transportation needs - non-medical: Not on file  Occupational History  . Occupation: Retired  Tobacco Use  . Smoking status: Current Every Day Smoker    Types: Cigarettes  . Smokeless tobacco: Never Used  Substance and Sexual Activity  . Alcohol use: No    Alcohol/week: 0.0 oz  . Drug use: No  . Sexual activity: Not on file  Other Topics Concern  . Not on file  Social History Narrative  . Not on file   Family History  Problem Relation Age of Onset  . Diabetes Mother   . Hemophilia Mother   . Diabetes Brother   . Breast cancer Sister   . Heart disease Father   . Colon polyps Father   . Colon polyps Sister       Review of Systems  All other systems reviewed and are negative.      Objective:   Physical Exam  Constitutional: She is oriented to person, place, and time. She appears well-developed and well-nourished. No distress.  Neck: Neck supple. No JVD present. No thyromegaly present.  Cardiovascular: Normal rate, regular rhythm and intact distal pulses.  Murmur heard. Pulmonary/Chest: Effort normal and breath sounds normal. No respiratory distress. She has no wheezes. She has no rales.  Abdominal: Soft. Bowel sounds are normal. She exhibits no distension. There is no tenderness. There is no rebound and no guarding.  Musculoskeletal:  She exhibits no edema.  Lymphadenopathy:    She has no cervical adenopathy.  Neurological: She is alert and oriented to person, place, and time. She exhibits normal muscle tone. Coordination normal.  Skin: She is not diaphoretic.  Vitals reviewed.         Assessment & Plan:  Controlled type 2 diabetes mellitus without complication, without long-term current use of insulin ([place]) - Plan: CBC with Differential/Platelet, COMPLETE METABOLIC PANEL WITH GFR, Lipid panel, Microalbumin, urine, Hemoglobin A1c  Essential hypertension  Pure hypercholesterolemia  TOBACCO ABUSE  Continue to encourage smoking cessation. Patient appears to have emphysema. I believe this is the most likely cause of her dyspnea on exertion. I recommended Symbicort 160/4.5, 2 puffs inhaled twice daily and recheck in one month to see if symptoms improve. Consider imaging of the chest as well if shortness of breath does not improve. Given the new onset murmur, I would also like to obtain an echocardiogram of the heart. I believe she is developing aortic stenosis but also want to check her ejection fraction. Her blood pressure today is out of control. I want the patient to resume her Cardizem immediately and recheck blood pressure in one month. She received her flu shot today. I will check her hemoglobin A1c. Goal hemoglobin A1c is less than 7. I will also check a fasting lipid panel. Goal LDL cholesterol is less than 70. I will also check a urine microalbumin.

## 2017-08-11 LAB — COMPLETE METABOLIC PANEL WITH GFR
AG Ratio: 1.3 (calc) (ref 1.0–2.5)
ALBUMIN MSPROF: 3.9 g/dL (ref 3.6–5.1)
ALKALINE PHOSPHATASE (APISO): 42 U/L (ref 33–130)
ALT: 14 U/L (ref 6–29)
AST: 16 U/L (ref 10–35)
BILIRUBIN TOTAL: 0.2 mg/dL (ref 0.2–1.2)
BUN: 11 mg/dL (ref 7–25)
CO2: 30 mmol/L (ref 20–32)
CREATININE: 0.74 mg/dL (ref 0.50–0.99)
Calcium: 9.4 mg/dL (ref 8.6–10.4)
Chloride: 98 mmol/L (ref 98–110)
GFR, EST AFRICAN AMERICAN: 96 mL/min/{1.73_m2} (ref >=60)
GFR, Est Non African American: 83 mL/min/{1.73_m2} (ref >=60)
GLOBULIN: 3 g/dL (ref 1.9–3.7)
Glucose, Bld: 116 mg/dL — ABNORMAL HIGH (ref 65–99)
Potassium: 4.7 mmol/L (ref 3.5–5.3)
SODIUM: 137 mmol/L (ref 135–146)
TOTAL PROTEIN: 6.9 g/dL (ref 6.1–8.1)

## 2017-08-11 LAB — CBC WITH DIFFERENTIAL/PLATELET
BASOS PCT: 0.7 %
Basophils Absolute: 59 cells/uL (ref 0–200)
Eosinophils Absolute: 202 cells/uL (ref 15–500)
Eosinophils Relative: 2.4 %
HEMATOCRIT: 37.8 % (ref 35.0–45.0)
HEMOGLOBIN: 12 g/dL (ref 11.7–15.5)
Lymphs Abs: 3335 cells/uL (ref 850–3900)
MCH: 26.5 pg — AB (ref 27.0–33.0)
MCHC: 31.7 g/dL — AB (ref 32.0–36.0)
MCV: 83.6 fL (ref 80.0–100.0)
MPV: 9.5 fL (ref 7.5–12.5)
Monocytes Relative: 7.1 %
Neutro Abs: 4208 cells/uL (ref 1500–7800)
Neutrophils Relative %: 50.1 %
Platelets: 422 10*3/uL — ABNORMAL HIGH (ref 140–400)
RBC: 4.52 10*6/uL (ref 3.80–5.10)
RDW: 15.2 % — ABNORMAL HIGH (ref 11.0–15.0)
Total Lymphocyte: 39.7 %
WBC: 8.4 10*3/uL (ref 3.8–10.8)
WBCMIX: 596 {cells}/uL (ref 200–950)

## 2017-08-11 LAB — LIPID PANEL
CHOL/HDL RATIO: 2.3 (calc) (ref <5.0)
CHOLESTEROL: 101 mg/dL (ref <200)
HDL: 44 mg/dL — ABNORMAL LOW (ref >50)
LDL CHOLESTEROL (CALC): 41 mg/dL
Non-HDL Cholesterol (Calc): 57 mg/dL (calc) (ref <130)
Triglycerides: 75 mg/dL (ref <150)

## 2017-08-11 LAB — MICROALBUMIN, URINE: Microalb, Ur: 0.6 mg/dL

## 2017-08-11 LAB — HEMOGLOBIN A1C
HEMOGLOBIN A1C: 6.3 %{Hb} — AB (ref <5.7)
Mean Plasma Glucose: 134 (calc)
eAG (mmol/L): 7.4 (calc)

## 2017-08-12 DIAGNOSIS — E119 Type 2 diabetes mellitus without complications: Secondary | ICD-10-CM | POA: Diagnosis not present

## 2017-08-13 ENCOUNTER — Ambulatory Visit (HOSPITAL_COMMUNITY): Payer: Medicare Other | Attending: Family Medicine

## 2017-08-13 ENCOUNTER — Other Ambulatory Visit: Payer: Self-pay

## 2017-08-13 DIAGNOSIS — N189 Chronic kidney disease, unspecified: Secondary | ICD-10-CM | POA: Insufficient documentation

## 2017-08-13 DIAGNOSIS — R0609 Other forms of dyspnea: Secondary | ICD-10-CM

## 2017-08-13 DIAGNOSIS — E1122 Type 2 diabetes mellitus with diabetic chronic kidney disease: Secondary | ICD-10-CM | POA: Insufficient documentation

## 2017-08-13 DIAGNOSIS — I129 Hypertensive chronic kidney disease with stage 1 through stage 4 chronic kidney disease, or unspecified chronic kidney disease: Secondary | ICD-10-CM | POA: Insufficient documentation

## 2017-08-13 DIAGNOSIS — I2510 Atherosclerotic heart disease of native coronary artery without angina pectoris: Secondary | ICD-10-CM | POA: Insufficient documentation

## 2017-08-13 DIAGNOSIS — E7800 Pure hypercholesterolemia, unspecified: Secondary | ICD-10-CM | POA: Diagnosis not present

## 2017-08-17 ENCOUNTER — Telehealth: Payer: Self-pay | Admitting: Family Medicine

## 2017-08-17 MED ORDER — BUDESONIDE-FORMOTEROL FUMARATE 160-4.5 MCG/ACT IN AERO: 2.0000 | INHALATION_SPRAY | Freq: Two times a day (BID) | RESPIRATORY_TRACT | 3 refills | Status: DC

## 2017-08-17 NOTE — Telephone Encounter (Signed)
Pt called and states that the Symbicort is working well for her and would like it sent to her mail pharmacy. Med sent to requested pharm.

## 2017-08-31 DIAGNOSIS — M25512 Pain in left shoulder: Secondary | ICD-10-CM | POA: Diagnosis not present

## 2017-08-31 DIAGNOSIS — M545 Low back pain: Secondary | ICD-10-CM | POA: Diagnosis not present

## 2017-08-31 DIAGNOSIS — M25511 Pain in right shoulder: Secondary | ICD-10-CM | POA: Diagnosis not present

## 2017-08-31 DIAGNOSIS — M542 Cervicalgia: Secondary | ICD-10-CM | POA: Diagnosis not present

## 2017-08-31 DIAGNOSIS — G894 Chronic pain syndrome: Secondary | ICD-10-CM | POA: Diagnosis not present

## 2017-09-09 ENCOUNTER — Other Ambulatory Visit: Payer: Self-pay | Admitting: Family Medicine

## 2017-09-09 DIAGNOSIS — M545 Low back pain: Principal | ICD-10-CM

## 2017-09-09 DIAGNOSIS — G8929 Other chronic pain: Secondary | ICD-10-CM

## 2017-11-30 DIAGNOSIS — G894 Chronic pain syndrome: Secondary | ICD-10-CM | POA: Diagnosis not present

## 2017-11-30 DIAGNOSIS — Z79891 Long term (current) use of opiate analgesic: Secondary | ICD-10-CM | POA: Diagnosis not present

## 2017-12-24 DIAGNOSIS — H40013 Open angle with borderline findings, low risk, bilateral: Secondary | ICD-10-CM | POA: Diagnosis not present

## 2018-02-04 ENCOUNTER — Other Ambulatory Visit: Payer: Self-pay | Admitting: Family Medicine

## 2018-02-04 DIAGNOSIS — Z1231 Encounter for screening mammogram for malignant neoplasm of breast: Secondary | ICD-10-CM

## 2018-03-15 ENCOUNTER — Ambulatory Visit (HOSPITAL_COMMUNITY)
Admission: RE | Admit: 2018-03-15 | Discharge: 2018-03-15 | Disposition: A | Discharge status: Home / Self Care | Payer: Medicare Other | Source: Ambulatory Visit | Attending: Family Medicine | Admitting: Family Medicine

## 2018-03-15 ENCOUNTER — Other Ambulatory Visit: Payer: Self-pay | Admitting: Family Medicine

## 2018-03-15 DIAGNOSIS — Z1231 Encounter for screening mammogram for malignant neoplasm of breast: Secondary | ICD-10-CM

## 2018-04-01 ENCOUNTER — Other Ambulatory Visit: Payer: Self-pay

## 2018-04-01 DIAGNOSIS — I83893 Varicose veins of bilateral lower extremities with other complications: Secondary | ICD-10-CM

## 2018-04-05 ENCOUNTER — Ambulatory Visit (INDEPENDENT_AMBULATORY_CARE_PROVIDER_SITE_OTHER): Payer: Medicare Other | Admitting: Family Medicine

## 2018-04-05 ENCOUNTER — Encounter: Payer: Self-pay | Admitting: Family Medicine

## 2018-04-05 VITALS — BP 130/70 | HR 90 | Temp 98.5°F | Resp 20 | Ht 64.0 in | Wt 175.0 lb

## 2018-04-05 DIAGNOSIS — R0609 Other forms of dyspnea: Secondary | ICD-10-CM

## 2018-04-05 DIAGNOSIS — D649 Anemia, unspecified: Secondary | ICD-10-CM | POA: Diagnosis not present

## 2018-04-05 DIAGNOSIS — I10 Essential (primary) hypertension: Secondary | ICD-10-CM

## 2018-04-05 DIAGNOSIS — E7800 Pure hypercholesterolemia, unspecified: Secondary | ICD-10-CM

## 2018-04-05 DIAGNOSIS — M79604 Pain in right leg: Secondary | ICD-10-CM

## 2018-04-05 DIAGNOSIS — E119 Type 2 diabetes mellitus without complications: Secondary | ICD-10-CM | POA: Diagnosis not present

## 2018-04-05 NOTE — Progress Notes (Signed)
Subjective:    Patient ID: [name] [name], female    DOB: [birth year], 67 y.o.   MRN: [id number]  Medication Refill   08/10/17 Patient is a very pleasant 67 year old white female here today for follow-up of her chronic medical conditions. Has not been seen since 07/2016.  She is a history of coronary artery disease status post angioplasty.  She is currently on aspirin. Unfortunately she continues to smoke a pack of cigarettes per day. She does report dyspnea on exertion. She denies any chest pain or angina. I suspect this may be due to underlying emphysema secondary to smoking as I explained to the patient. However on examination today, the patient has a new 2/6 systolic ejection murmur heard best over the aortic valve. Given her other medical issues, congestive heart failure could also be a potential cause for dyspnea on exertion. I have recommended an echocardiogram. She is due today for a flu shot. Her blood pressures out of control but she recently ran out of her Cardizem 5 days ago. She is not checking her blood sugar. She checked it this morning it was 137. She denies any polyuria, polydipsia, blurred vision. She denies any numbness or tingling in her feet. Diabetic foot exam was performed today.  At that time, my plan was: Continue to encourage smoking cessation. Patient appears to have emphysema. I believe this is the most likely cause of her dyspnea on exertion. I recommended Symbicort 160/4.5, 2 puffs inhaled twice daily and recheck in one month to see if symptoms improve. Consider imaging of the chest as well if shortness of breath does not improve. Given the new onset murmur, I would also like to obtain an echocardiogram of the heart. I believe she is developing aortic stenosis but also want to check her ejection fraction. Her blood pressure today is out of control. I want the patient to resume her Cardizem immediately and recheck blood pressure in one month. She received her flu shot today. I  will check her hemoglobin A1c. Goal hemoglobin A1c is less than 7. I will also check a fasting lipid panel. Goal LDL cholesterol is less than 70. I will also check a urine microalbumin.  04/05/18 Patient has been relatively inactive recently.  Starting on Friday, she developed sudden severe pain in her right calf.  She also developed swelling in her right leg distal to her knee all the way to her foot.  She has +1 edema in her right leg with no edema in her left leg.  She has tenderness to palpation in the posterior calf.  She has a positive Homans sign.  There is some mild erythema in the right foot.  However there is no other explanation for the swelling in her right leg.  I am concerned about a possible DVT.  She has not been seen here in quite some time.  She is overdue for fasting lab work particular to monitor her sugar, her cholesterol, her renal function, and her liver function test. Past Medical History:  Diagnosis Date  . Anxiety   . Arthritis   . CAD (coronary artery disease)   . Chronic kidney disease    Renal insufficiency  . Depression   . Diabetes mellitus   . Diverticulosis of colon (without mention of hemorrhage)   . Duodenitis without mention of hemorrhage   . GERD (gastroesophageal reflux disease)   . Hemorrhoids   . Hypercholesteremia   . Hypertension   . Insomnia   . Tinnitus   .  Vertigo    Past Surgical History:  Procedure Laterality Date  . ANGIOPLASTY    . CHOLECYSTECTOMY    . ROTATOR CUFF REPAIR Right 04/2012  . TUBAL LIGATION     Current Outpatient Medications on File Prior to Visit  Medication Sig Dispense Refill  . albuterol (PROVENTIL HFA;VENTOLIN HFA) 108 (90 Base) MCG/ACT inhaler Inhale 2 puffs into the lungs every 4 (four) hours as needed for wheezing or shortness of breath. 3 Inhaler 1  . aspirin 325 MG tablet Take 1 tablet (325 mg total) by mouth daily. 90 tablet 3  . atorvastatin (LIPITOR) 20 MG tablet Take 1 tablet (20 mg total) by mouth daily. 90  tablet 3  . bimatoprost (LUMIGAN) 0.03 % ophthalmic solution 1 drop at bedtime.    . budesonide-formoterol (SYMBICORT) 160-4.5 MCG/ACT inhaler Inhale 2 puffs into the lungs 2 (two) times daily. 3 Inhaler 3  . cyclobenzaprine (FLEXERIL) 10 MG tablet Take 1 tablet (10 mg total) by mouth 3 (three) times daily as needed for muscle spasms. 90 tablet 2  . dexlansoprazole (DEXILANT) 60 MG capsule Take 1 capsule (60 mg total) by mouth daily. 90 capsule 3  . diltiazem (CARDIZEM) 120 MG tablet Take 1 tablet (120 mg total) by mouth daily. 90 tablet 3  . glucose blood (FREESTYLE LITE) test strip Pt checks BS bid - also needs lancets disp: 1 box with prn refills 100 each 11  . HYDROcodone-acetaminophen (NORCO) 10-325 MG per tablet Take 1 tablet by mouth every 6 (six) hours as needed for moderate pain.     Marland Kitchen ipratropium-albuterol (DUONEB) 0.5-2.5 (3) MG/3ML SOLN INHALE 1 VIAL VIA NEBULIZER EVERY 6 HOURS AS NEEDED 360 mL 0  . losartan (COZAAR) 50 MG tablet Take 1 tablet (50 mg total) by mouth daily. 90 tablet 3  . metFORMIN (GLUCOPHAGE) 1000 MG tablet Take 1 tablet (1,000 mg total) by mouth 2 (two) times daily with a meal. 180 tablet 3  . mometasone (NASONEX) 50 MCG/ACT nasal spray Place 2 sprays into the nose daily. Please dispense 3 bottles for a 3 month supply 51 g 3  . oxymorphone (OPANA ER) 20 MG 12 hr tablet Take 20 mg by mouth every 12 (twelve) hours.    Marland Kitchen rOPINIRole (REQUIP) 2 MG tablet Take 1 tablet (2 mg total) by mouth at bedtime. 90 tablet 3  . tolterodine (DETROL LA) 2 MG 24 hr capsule TAKE 1 CAPSULE (2 MG TOTAL) BY MOUTH DAILY. 90 capsule 3  . zolpidem (AMBIEN) 10 MG tablet Take 1 tablet (10 mg total) by mouth at bedtime as needed for sleep. 15 tablet 1   No current facility-administered medications on file prior to visit.    Allergies  Allergen Reactions  . Guaifenesin & Derivatives     Severe Reaction  . Benazepril Other (See Comments)    cough  . Latex     REACTION: powder on gloves  .  Morphine     REACTION: nausea  . Zyrtec [Cetirizine] Other (See Comments)    swelling   Social History   Socioeconomic History  . Marital status: Married    Spouse name: Not on file  . Number of children: 1  . Years of education: Not on file  . Highest education level: Not on file  Occupational History  . Occupation: Retired  Scientific laboratory technician  . Financial resource strain: Not on file  . Food insecurity:    Worry: Not on file    Inability: Not on file  . Transportation  needs:    Medical: Not on file    Non-medical: Not on file  Tobacco Use  . Smoking status: Current Every Day Smoker    Types: Cigarettes  . Smokeless tobacco: Never Used  Substance and Sexual Activity  . Alcohol use: No    Alcohol/week: 0.0 standard drinks  . Drug use: No  . Sexual activity: Not on file  Lifestyle  . Physical activity:    Days per week: Not on file    Minutes per session: Not on file  . Stress: Not on file  Relationships  . Social connections:    Talks on phone: Not on file    Gets together: Not on file    Attends religious service: Not on file    Active member of club or organization: Not on file    Attends meetings of clubs or organizations: Not on file    Relationship status: Not on file  . Intimate partner violence:    Fear of current or ex partner: Not on file    Emotionally abused: Not on file    Physically abused: Not on file    Forced sexual activity: Not on file  Other Topics Concern  . Not on file  Social History Narrative  . Not on file   Family History  Problem Relation Age of Onset  . Diabetes Mother   . Hemophilia Mother   . Diabetes Brother   . Breast cancer Sister   . Heart disease Father   . Colon polyps Father   . Colon polyps Sister       Review of Systems  All other systems reviewed and are negative.      Objective:   Physical Exam  Constitutional: She is oriented to person, place, and time. She appears well-developed and well-nourished. No  distress.  Neck: Neck supple. No JVD present. No thyromegaly present.  Cardiovascular: Normal rate, regular rhythm and intact distal pulses.  Murmur heard. Pulmonary/Chest: Effort normal and breath sounds normal. No respiratory distress. She has no wheezes. She has no rales.  Abdominal: Soft. Bowel sounds are normal. She exhibits no distension. There is no tenderness. There is no rebound and no guarding.  Musculoskeletal: She exhibits edema.       Right lower leg: She exhibits tenderness, swelling and edema. She exhibits no deformity.  Lymphadenopathy:    She has no cervical adenopathy.  Neurological: She is alert and oriented to person, place, and time. She exhibits normal muscle tone. Coordination normal.  Skin: She is not diaphoretic.  Vitals reviewed.         Assessment & Plan:  Controlled type 2 diabetes mellitus without complication, without long-term current use of insulin ([place]) - Plan: CBC with Differential/Platelet, COMPLETE METABOLIC PANEL WITH GFR, Lipid panel, Hemoglobin A1c  Essential hypertension  Pure hypercholesterolemia  Dyspnea on exertion  Right leg pain - Plan: VAS [place] LOWER EXTREMITY VENOUS (DVT)  Differential diagnosis includes superficial thrombophlebitis versus a DVT.  Obtain an urgent ultrasound of the veins in the right leg to rule out DVT.  This cannot be scheduled until tomorrow due to the lateness of the evening.  Rather than go to the emergency room, I discussed the situation with the patient and she elects to take samples of Xarelto 15 mg p.o. twice daily that I have on hand until we can obtain the ultrasound tomorrow.  We discussed the risk of bleeding and she is willing to accept that risk.  I will also obtain lab  work today to monitor her blood sugar along with her other labs.

## 2018-04-06 ENCOUNTER — Ambulatory Visit (HOSPITAL_COMMUNITY)
Admission: RE | Admit: 2018-04-06 | Discharge: 2018-04-06 | Disposition: A | Discharge status: Home / Self Care | Payer: Medicare Other | Source: Ambulatory Visit | Attending: Family Medicine | Admitting: Family Medicine

## 2018-04-06 ENCOUNTER — Telehealth: Payer: Self-pay

## 2018-04-06 DIAGNOSIS — I82401 Acute embolism and thrombosis of unspecified deep veins of right lower extremity: Secondary | ICD-10-CM | POA: Diagnosis not present

## 2018-04-06 DIAGNOSIS — M79604 Pain in right leg: Secondary | ICD-10-CM | POA: Diagnosis not present

## 2018-04-06 LAB — COMPLETE METABOLIC PANEL WITH GFR
AG Ratio: 1.2 (calc) (ref 1.0–2.5)
ALT: 10 U/L (ref 6–29)
AST: 14 U/L (ref 10–35)
Albumin: 4.1 g/dL (ref 3.6–5.1)
Alkaline phosphatase (APISO): 48 U/L (ref 33–130)
BUN: 8 mg/dL (ref 7–25)
CALCIUM: 9.7 mg/dL (ref 8.6–10.4)
CO2: 26 mmol/L (ref 20–32)
Chloride: 98 mmol/L (ref 98–110)
Creat: 0.83 mg/dL (ref 0.50–0.99)
GFR, EST AFRICAN AMERICAN: 84 mL/min/{1.73_m2} (ref >=60)
GFR, EST NON AFRICAN AMERICAN: 72 mL/min/{1.73_m2} (ref >=60)
GLUCOSE: 104 mg/dL — AB (ref 65–99)
Globulin: 3.4 g/dL (calc) (ref 1.9–3.7)
Potassium: 4.8 mmol/L (ref 3.5–5.3)
Sodium: 136 mmol/L (ref 135–146)
Total Bilirubin: 0.2 mg/dL (ref 0.2–1.2)
Total Protein: 7.5 g/dL (ref 6.1–8.1)

## 2018-04-06 LAB — IRON: Iron: 23 ug/dL — ABNORMAL LOW (ref 45–160)

## 2018-04-06 LAB — CBC WITH DIFFERENTIAL/PLATELET
BASOS PCT: 0.5 %
Basophils Absolute: 53 cells/uL (ref 0–200)
Eosinophils Absolute: 170 cells/uL (ref 15–500)
Eosinophils Relative: 1.6 %
HCT: 35.9 % (ref 35.0–45.0)
Hemoglobin: 11.6 g/dL — ABNORMAL LOW (ref 11.7–15.5)
Lymphs Abs: 3392 cells/uL (ref 850–3900)
MCH: 25.8 pg — ABNORMAL LOW (ref 27.0–33.0)
MCHC: 32.3 g/dL (ref 32.0–36.0)
MCV: 79.8 fL — ABNORMAL LOW (ref 80.0–100.0)
MONOS PCT: 7.5 %
MPV: 10 fL (ref 7.5–12.5)
Neutro Abs: 6190 cells/uL (ref 1500–7800)
Neutrophils Relative %: 58.4 %
PLATELETS: 401 10*3/uL — AB (ref 140–400)
RBC: 4.5 10*6/uL (ref 3.80–5.10)
RDW: 15.3 % — ABNORMAL HIGH (ref 11.0–15.0)
TOTAL LYMPHOCYTE: 32 %
WBC: 10.6 10*3/uL (ref 3.8–10.8)
WBCMIX: 795 {cells}/uL (ref 200–950)

## 2018-04-06 LAB — LIPID PANEL
Cholesterol: 103 mg/dL (ref <200)
HDL: 45 mg/dL — ABNORMAL LOW (ref >50)
LDL CHOLESTEROL (CALC): 43 mg/dL
NON-HDL CHOLESTEROL (CALC): 58 mg/dL (ref <130)
TRIGLYCERIDES: 72 mg/dL (ref <150)
Total CHOL/HDL Ratio: 2.3 (calc) (ref <5.0)

## 2018-04-06 LAB — HEMOGLOBIN A1C
HEMOGLOBIN A1C: 6.3 %{Hb} — AB (ref <5.7)
Mean Plasma Glucose: 134 (calc)
eAG (mmol/L): 7.4 (calc)

## 2018-04-06 LAB — TEST AUTHORIZATION

## 2018-04-06 NOTE — Telephone Encounter (Signed)
[name] [name] with [facility] called with STAT results which were provided verbally to provider.  Results negative for DVT but patient has acute superficial vein thrombosis of the great saphenous vein.  Per provider call was placed to patient to schedule an appointment for her to be seen in office on 04/08/2018 also to notify her to continue the medication he prescribed when she was in office. [name].  Patient returned call and is aware of provider recommendations and has an appointment scheduled for 04/08/2018

## 2018-04-06 NOTE — Progress Notes (Signed)
Right lower extremity venous duplex has been completed. Negative for DVT. There is evidence of acute superficial vein thrombosis involving the great saphenous vein on the right lower extremity. Results were given to [name] at Dr. Samella Parr office.   04/06/18 3:11 PM [name] [name] RVT

## 2018-04-08 ENCOUNTER — Encounter: Payer: Self-pay | Admitting: Family Medicine

## 2018-04-08 ENCOUNTER — Ambulatory Visit (INDEPENDENT_AMBULATORY_CARE_PROVIDER_SITE_OTHER): Payer: Medicare Other | Admitting: Family Medicine

## 2018-04-08 VITALS — BP 152/60 | HR 86 | Temp 98.1°F | Resp 20 | Ht 64.0 in | Wt 174.0 lb

## 2018-04-08 DIAGNOSIS — I82890 Acute embolism and thrombosis of other specified veins: Secondary | ICD-10-CM

## 2018-04-08 DIAGNOSIS — D509 Iron deficiency anemia, unspecified: Secondary | ICD-10-CM

## 2018-04-08 MED ORDER — RIVAROXABAN 15 MG PO TABS: 15.0000 mg | ORAL_TABLET | Freq: Two times a day (BID) | ORAL | 0 refills | Status: DC

## 2018-04-08 NOTE — Progress Notes (Signed)
Subjective:    Patient ID: [name] [name], female    DOB: Oct 23, 1950, 67 y.o.   MRN: [id number]  Medication Refill   08/10/17 Patient is a very pleasant 67 year old white female here today for follow-up of her chronic medical conditions. Has not been seen since 07/2016.  She is a history of coronary artery disease status post angioplasty.  She is currently on aspirin. Unfortunately she continues to smoke a pack of cigarettes per day. She does report dyspnea on exertion. She denies any chest pain or angina. I suspect this may be due to underlying emphysema secondary to smoking as I explained to the patient. However on examination today, the patient has a new 2/6 systolic ejection murmur heard best over the aortic valve. Given her other medical issues, congestive heart failure could also be a potential cause for dyspnea on exertion. I have recommended an echocardiogram. She is due today for a flu shot. Her blood pressures out of control but she recently ran out of her Cardizem 5 days ago. She is not checking her blood sugar. She checked it this morning it was 137. She denies any polyuria, polydipsia, blurred vision. She denies any numbness or tingling in her feet. Diabetic foot exam was performed today.  At that time, my plan was: Continue to encourage smoking cessation. Patient appears to have emphysema. I believe this is the most likely cause of her dyspnea on exertion. I recommended Symbicort 160/4.5, 2 puffs inhaled twice daily and recheck in one month to see if symptoms improve. Consider imaging of the chest as well if shortness of breath does not improve. Given the new onset murmur, I would also like to obtain an echocardiogram of the heart. I believe she is developing aortic stenosis but also want to check her ejection fraction. Her blood pressure today is out of control. I want the patient to resume her Cardizem immediately and recheck blood pressure in one month. She received her flu shot today. I  will check her hemoglobin A1c. Goal hemoglobin A1c is less than 7. I will also check a fasting lipid panel. Goal LDL cholesterol is less than 70. I will also check a urine microalbumin.  04/05/18 Patient has been relatively inactive recently.  Starting on Friday, she developed sudden severe pain in her right calf.  She also developed swelling in her right leg distal to her knee all the way to her foot.  She has +1 edema in her right leg with no edema in her left leg.  She has tenderness to palpation in the posterior calf.  She has a positive Homans sign.  There is some mild erythema in the right foot.  However there is no other explanation for the swelling in her right leg.  I am concerned about a possible DVT.  She has not been seen here in quite some time.  She is overdue for fasting lab work particular to monitor her sugar, her cholesterol, her renal function, and her liver function test. At that time, my plan was: Differential diagnosis includes superficial thrombophlebitis versus a DVT.  Obtain an urgent ultrasound of the veins in the right leg to rule out DVT.  This cannot be scheduled until tomorrow due to the lateness of the evening.  Rather than go to the emergency room, I discussed the situation with the patient and she elects to take samples of Xarelto 15 mg p.o. twice daily that I have on hand until we can obtain the ultrasound tomorrow.  We  discussed the risk of bleeding and she is willing to accept that risk.  I will also obtain lab work today to monitor her blood sugar along with her other labs.  04/08/18 Lower extremity venous Dopplers did confirm thrombus in the great saphenous vein.  Patient is here today to discuss.  She has a superficial vein thrombus.  The determination is whether she is at increased risk for propagation and thromboembolism.  My biggest concern is her family history.  Brother died from a DVT at an early age.  Furthermore, the patient developed this thrombus essentially for  no reason.  Also on her lab work, the patient was found to have a mild microcytic anemia with a hemoglobin of 11.6.  Iron level was checked and found to be low at 23.  Patient has not had a colonoscopy in quite some time  Past Medical History:  Diagnosis Date  . Anxiety   . Arthritis   . CAD (coronary artery disease)   . Chronic kidney disease    Renal insufficiency  . Depression   . Diabetes mellitus   . Diverticulosis of colon (without mention of hemorrhage)   . Duodenitis without mention of hemorrhage   . GERD (gastroesophageal reflux disease)   . Hemorrhoids   . Hypercholesteremia   . Hypertension   . Insomnia   . Tinnitus   . Vertigo    Past Surgical History:  Procedure Laterality Date  . ANGIOPLASTY    . CHOLECYSTECTOMY    . ROTATOR CUFF REPAIR Right 04/2012  . TUBAL LIGATION     Current Outpatient Medications on File Prior to Visit  Medication Sig Dispense Refill  . albuterol (PROVENTIL HFA;VENTOLIN HFA) 108 (90 Base) MCG/ACT inhaler Inhale 2 puffs into the lungs every 4 (four) hours as needed for wheezing or shortness of breath. 3 Inhaler 1  . aspirin 325 MG tablet Take 1 tablet (325 mg total) by mouth daily. 90 tablet 3  . atorvastatin (LIPITOR) 20 MG tablet Take 1 tablet (20 mg total) by mouth daily. 90 tablet 3  . bimatoprost (LUMIGAN) 0.03 % ophthalmic solution 1 drop at bedtime.    . cyclobenzaprine (FLEXERIL) 10 MG tablet Take 1 tablet (10 mg total) by mouth 3 (three) times daily as needed for muscle spasms. 90 tablet 2  . dexlansoprazole (DEXILANT) 60 MG capsule Take 1 capsule (60 mg total) by mouth daily. 90 capsule 3  . diltiazem (CARDIZEM) 120 MG tablet Take 1 tablet (120 mg total) by mouth daily. 90 tablet 3  . gabapentin (NEURONTIN) 300 MG capsule Take 300 mg by mouth 2 (two) times daily.    Marland Kitchen glucose blood (FREESTYLE LITE) test strip Pt checks BS bid - also needs lancets disp: 1 box with prn refills 100 each 11  . HYDROcodone-acetaminophen (NORCO) 10-325 MG  per tablet Take 1 tablet by mouth every 6 (six) hours as needed for moderate pain.     Marland Kitchen ipratropium-albuterol (DUONEB) 0.5-2.5 (3) MG/3ML SOLN INHALE 1 VIAL VIA NEBULIZER EVERY 6 HOURS AS NEEDED 360 mL 0  . losartan (COZAAR) 50 MG tablet Take 1 tablet (50 mg total) by mouth daily. 90 tablet 3  . metFORMIN (GLUCOPHAGE) 1000 MG tablet Take 1 tablet (1,000 mg total) by mouth 2 (two) times daily with a meal. 180 tablet 3  . oxymorphone (OPANA ER) 20 MG 12 hr tablet Take 20 mg by mouth every 12 (twelve) hours.    Marland Kitchen rOPINIRole (REQUIP) 2 MG tablet Take 1 tablet (2 mg total)  by mouth at bedtime. 90 tablet 3  . tolterodine (DETROL LA) 2 MG 24 hr capsule TAKE 1 CAPSULE (2 MG TOTAL) BY MOUTH DAILY. 90 capsule 3  . zolpidem (AMBIEN) 10 MG tablet Take 1 tablet (10 mg total) by mouth at bedtime as needed for sleep. 15 tablet 1   No current facility-administered medications on file prior to visit.    Allergies  Allergen Reactions  . Guaifenesin & Derivatives     Severe Reaction  . Benazepril Other (See Comments)    cough  . Latex     REACTION: powder on gloves  . Morphine     REACTION: nausea  . Zyrtec [Cetirizine] Other (See Comments)    swelling   Social History   Socioeconomic History  . Marital status: Married    Spouse name: Not on file  . Number of children: 1  . Years of education: Not on file  . Highest education level: Not on file  Occupational History  . Occupation: Retired  Scientific laboratory technician  . Financial resource strain: Not on file  . Food insecurity:    Worry: Not on file    Inability: Not on file  . Transportation needs:    Medical: Not on file    Non-medical: Not on file  Tobacco Use  . Smoking status: Current Every Day Smoker    Types: Cigarettes  . Smokeless tobacco: Never Used  Substance and Sexual Activity  . Alcohol use: No    Alcohol/week: 0.0 standard drinks  . Drug use: No  . Sexual activity: Not on file  Lifestyle  . Physical activity:    Days per week: Not  on file    Minutes per session: Not on file  . Stress: Not on file  Relationships  . Social connections:    Talks on phone: Not on file    Gets together: Not on file    Attends religious service: Not on file    Active member of club or organization: Not on file    Attends meetings of clubs or organizations: Not on file    Relationship status: Not on file  . Intimate partner violence:    Fear of current or ex partner: Not on file    Emotionally abused: Not on file    Physically abused: Not on file    Forced sexual activity: Not on file  Other Topics Concern  . Not on file  Social History Narrative  . Not on file   Family History  Problem Relation Age of Onset  . Diabetes Mother   . Hemophilia Mother   . Diabetes Brother   . Breast cancer Sister   . Heart disease Father   . Colon polyps Father   . Colon polyps Sister       Review of Systems  All other systems reviewed and are negative.      Objective:   Physical Exam  Constitutional: She is oriented to person, place, and time. She appears well-developed and well-nourished. No distress.  Neck: Neck supple. No JVD present. No thyromegaly present.  Cardiovascular: Normal rate, regular rhythm and intact distal pulses.  Murmur heard. Pulmonary/Chest: Effort normal and breath sounds normal. No respiratory distress. She has no wheezes. She has no rales.  Abdominal: Soft. Bowel sounds are normal. She exhibits no distension. There is no tenderness. There is no rebound and no guarding.  Musculoskeletal: She exhibits edema.       Right lower leg: She exhibits tenderness, swelling and  edema. She exhibits no deformity.  Lymphadenopathy:    She has no cervical adenopathy.  Neurological: She is alert and oriented to person, place, and time. She exhibits normal muscle tone. Coordination normal.  Skin: She is not diaphoretic.  Vitals reviewed.         Assessment & Plan:  Microcytic anemia - Plan: Fecal Globin By  Immunochemistry  Superficial vein thrombosis  Patient has a superficial vein thrombosis.  We discussed treatment including 6 weeks of anticoagulation which would be an aggressive approach reserved for patients at increased risk for thromboembolism versus treatment with aspirin and warm compresses.  Given her family history of her brother dying from a DVT, the patient feels more comfortable treating this aggressively.  Therefore we will use Xarelto 15 mg p.o. twice daily for 21 days and then switching to 20 mg a day to complete 6 weeks total.  Afterward, I want the patient to resume her aspirin and begin to wear compression hose to help prevent this from occurring in the future.  Given her microcytic anemia, I will check her stool for blood.  Afterwards I want her to begin ferrous sulfate 325 mg a day and recheck a CBC in 1 month

## 2018-04-14 ENCOUNTER — Other Ambulatory Visit: Payer: Medicare Other

## 2018-04-14 DIAGNOSIS — D509 Iron deficiency anemia, unspecified: Secondary | ICD-10-CM | POA: Diagnosis not present

## 2018-04-15 ENCOUNTER — Encounter: Payer: Medicare Other | Admitting: Vascular Surgery

## 2018-04-15 ENCOUNTER — Inpatient Hospital Stay (HOSPITAL_COMMUNITY): Admission: RE | Admit: 2018-04-15 | Payer: Medicare Other | Source: Ambulatory Visit

## 2018-04-15 ENCOUNTER — Other Ambulatory Visit: Payer: Self-pay | Admitting: Family Medicine

## 2018-04-15 LAB — FECAL GLOBIN BY IMMUNOCHEMISTRY
FECAL GLOBIN RESULT:: NOT DETECTED
MICRO NUMBER: 91056022
SPECIMEN QUALITY: ADEQUATE

## 2018-04-15 MED ORDER — ZOLPIDEM TARTRATE 10 MG PO TABS: 10.0000 mg | ORAL_TABLET | Freq: Every evening | ORAL | 1 refills | Status: DC | PRN

## 2018-04-15 MED ORDER — TOLTERODINE TARTRATE ER 2 MG PO CP24: ORAL_CAPSULE | ORAL | 3 refills | Status: DC

## 2018-04-15 MED ORDER — LOSARTAN POTASSIUM 50 MG PO TABS: 50.0000 mg | ORAL_TABLET | Freq: Every day | ORAL | 3 refills | Status: DC

## 2018-04-15 MED ORDER — DILTIAZEM HCL 120 MG PO TABS: 120.0000 mg | ORAL_TABLET | Freq: Every day | ORAL | 3 refills | Status: DC

## 2018-04-15 MED ORDER — ROPINIROLE HCL 2 MG PO TABS: 2.0000 mg | ORAL_TABLET | Freq: Every day | ORAL | 3 refills | Status: DC

## 2018-04-15 MED ORDER — ATORVASTATIN CALCIUM 20 MG PO TABS: 20.0000 mg | ORAL_TABLET | Freq: Every day | ORAL | 3 refills | Status: DC

## 2018-04-15 MED ORDER — METFORMIN HCL 1000 MG PO TABS: 1000.0000 mg | ORAL_TABLET | Freq: Two times a day (BID) | ORAL | 3 refills | Status: DC

## 2018-04-15 MED ORDER — GABAPENTIN 300 MG PO CAPS: 300.0000 mg | ORAL_CAPSULE | Freq: Two times a day (BID) | ORAL | 3 refills | Status: DC

## 2018-04-15 MED ORDER — DEXLANSOPRAZOLE 60 MG PO CPDR: 60.0000 mg | DELAYED_RELEASE_CAPSULE | Freq: Every day | ORAL | 3 refills | Status: DC

## 2018-04-15 NOTE — Telephone Encounter (Signed)
Requesting refill    Ambien

## 2018-04-15 NOTE — Addendum Note (Signed)
Addended by: Shary Decamp B on: 04/15/2018 05:02 PM   Modules accepted: Orders

## 2018-04-20 ENCOUNTER — Encounter: Payer: Medicare Other | Admitting: Vascular Surgery

## 2018-04-20 ENCOUNTER — Encounter (HOSPITAL_COMMUNITY): Payer: Medicare Other

## 2018-05-04 ENCOUNTER — Encounter: Payer: Self-pay | Admitting: Family Medicine

## 2018-05-04 ENCOUNTER — Ambulatory Visit (INDEPENDENT_AMBULATORY_CARE_PROVIDER_SITE_OTHER): Payer: Medicare Other | Admitting: Family Medicine

## 2018-05-04 VITALS — BP 150/60 | HR 90 | Temp 98.0°F | Resp 18 | Ht 64.0 in | Wt 171.0 lb

## 2018-05-04 DIAGNOSIS — Z23 Encounter for immunization: Secondary | ICD-10-CM

## 2018-05-04 DIAGNOSIS — G2581 Restless legs syndrome: Secondary | ICD-10-CM | POA: Diagnosis not present

## 2018-05-04 MED ORDER — PREGABALIN 100 MG PO CAPS: 100.0000 mg | ORAL_CAPSULE | Freq: Two times a day (BID) | ORAL | 3 refills | Status: DC

## 2018-05-04 NOTE — Progress Notes (Signed)
Subjective:    Patient ID: [name] [name], female    DOB: 1951-03-18, 67 y.o.   MRN: [id number]  Medication Refill   08/10/17 Patient is a very pleasant 67 year old white female here today for follow-up of her chronic medical conditions. Has not been seen since 07/2016.  She is a history of coronary artery disease status post angioplasty.  She is currently on aspirin. Unfortunately she continues to smoke a pack of cigarettes per day. She does report dyspnea on exertion. She denies any chest pain or angina. I suspect this may be due to underlying emphysema secondary to smoking as I explained to the patient. However on examination today, the patient has a new 2/6 systolic ejection murmur heard best over the aortic valve. Given her other medical issues, congestive heart failure could also be a potential cause for dyspnea on exertion. I have recommended an echocardiogram. She is due today for a flu shot. Her blood pressures out of control but she recently ran out of her Cardizem 5 days ago. She is not checking her blood sugar. She checked it this morning it was 137. She denies any polyuria, polydipsia, blurred vision. She denies any numbness or tingling in her feet. Diabetic foot exam was performed today.  At that time, my plan was: Continue to encourage smoking cessation. Patient appears to have emphysema. I believe this is the most likely cause of her dyspnea on exertion. I recommended Symbicort 160/4.5, 2 puffs inhaled twice daily and recheck in one month to see if symptoms improve. Consider imaging of the chest as well if shortness of breath does not improve. Given the new onset murmur, I would also like to obtain an echocardiogram of the heart. I believe she is developing aortic stenosis but also want to check her ejection fraction. Her blood pressure today is out of control. I want the patient to resume her Cardizem immediately and recheck blood pressure in one month. She received her flu shot today. I  will check her hemoglobin A1c. Goal hemoglobin A1c is less than 7. I will also check a fasting lipid panel. Goal LDL cholesterol is less than 70. I will also check a urine microalbumin.  04/05/18 Patient has been relatively inactive recently.  Starting on Friday, she developed sudden severe pain in her right calf.  She also developed swelling in her right leg distal to her knee all the way to her foot.  She has +1 edema in her right leg with no edema in her left leg.  She has tenderness to palpation in the posterior calf.  She has a positive Homans sign.  There is some mild erythema in the right foot.  However there is no other explanation for the swelling in her right leg.  I am concerned about a possible DVT.  She has not been seen here in quite some time.  She is overdue for fasting lab work particular to monitor her sugar, her cholesterol, her renal function, and her liver function test. At that time, my plan was: Differential diagnosis includes superficial thrombophlebitis versus a DVT.  Obtain an urgent ultrasound of the veins in the right leg to rule out DVT.  This cannot be scheduled until tomorrow due to the lateness of the evening.  Rather than go to the emergency room, I discussed the situation with the patient and she elects to take samples of Xarelto 15 mg p.o. twice daily that I have on hand until we can obtain the ultrasound tomorrow.  We  discussed the risk of bleeding and she is willing to accept that risk.  I will also obtain lab work today to monitor her blood sugar along with her other labs.  04/08/18 Lower extremity venous Dopplers did confirm thrombus in the great saphenous vein.  Patient is here today to discuss.  She has a superficial vein thrombus.  The determination is whether she is at increased risk for propagation and thromboembolism.  My biggest concern is her family history.  Brother died from a DVT at an early age.  Furthermore, the patient developed this thrombus essentially for  no reason.  Also on her lab work, the patient was found to have a mild microcytic anemia with a hemoglobin of 11.6.  Iron level was checked and found to be low at 23.  Patient has not had a colonoscopy in quite some time.  At that time, my plan was: Patient has a superficial vein thrombosis.  We discussed treatment including 6 weeks of anticoagulation which would be an aggressive approach reserved for patients at increased risk for thromboembolism versus treatment with aspirin and warm compresses.  Given her family history of her brother dying from a DVT, the patient feels more comfortable treating this aggressively.  Therefore we will use Xarelto 15 mg p.o. twice daily for 21 days and then switching to 20 mg a day to complete 6 weeks total.  Afterward, I want the patient to resume her aspirin and begin to wear compression hose to help prevent this from occurring in the future.  Given her microcytic anemia, I will check her stool for blood.  Afterwards I want her to begin ferrous sulfate 325 mg a day and recheck a CBC in 1 month  05/04/18 Patient is here today to discuss treatment of restless leg syndrome.  She is currently on Requip 2 mg p.o. nightly in addition to gabapentin 300 mg p.o. twice daily.  She has yet to start iron replacement therapy I recommended in her last visit.  I did explain to her that there has been an association with iron deficiency and restless leg and therefore I have recommended that she start taking her iron tablets.  However she reports a burning stinging discomfort in both legs whenever she sits still.  This primarily occurs at night but also occurs during the daytime.  Symptoms improve if she stands and walks.  She is actually now taking 2 mg of Requip during the daytime as well for breakthrough symptoms.  However she is having a difficult time controlling the pain.  Symptoms are isolated to her legs.  Past Medical History:  Diagnosis Date  . Anxiety   . Arthritis   . CAD  (coronary artery disease)   . Chronic kidney disease    Renal insufficiency  . Depression   . Diabetes mellitus   . Diverticulosis of colon (without mention of hemorrhage)   . Duodenitis without mention of hemorrhage   . GERD (gastroesophageal reflux disease)   . Hemorrhoids   . Hypercholesteremia   . Hypertension   . Insomnia   . Tinnitus   . Vertigo    Past Surgical History:  Procedure Laterality Date  . ANGIOPLASTY    . CHOLECYSTECTOMY    . ROTATOR CUFF REPAIR Right 04/2012  . TUBAL LIGATION     Current Outpatient Medications on File Prior to Visit  Medication Sig Dispense Refill  . albuterol (PROVENTIL HFA;VENTOLIN HFA) 108 (90 Base) MCG/ACT inhaler Inhale 2 puffs into the lungs every 4 (four)  hours as needed for wheezing or shortness of breath. 3 Inhaler 1  . aspirin 325 MG tablet Take 1 tablet (325 mg total) by mouth daily. 90 tablet 3  . atorvastatin (LIPITOR) 20 MG tablet Take 1 tablet (20 mg total) by mouth daily. 90 tablet 3  . bimatoprost (LUMIGAN) 0.03 % ophthalmic solution 1 drop at bedtime.    . cyclobenzaprine (FLEXERIL) 10 MG tablet Take 1 tablet (10 mg total) by mouth 3 (three) times daily as needed for muscle spasms. 90 tablet 2  . dexlansoprazole (DEXILANT) 60 MG capsule Take 1 capsule (60 mg total) by mouth daily. 90 capsule 3  . diltiazem (CARDIZEM) 120 MG tablet Take 1 tablet (120 mg total) by mouth daily. 90 tablet 3  . gabapentin (NEURONTIN) 300 MG capsule Take 1 capsule (300 mg total) by mouth 2 (two) times daily. 180 capsule 3  . glucose blood (FREESTYLE LITE) test strip Pt checks BS bid - also needs lancets disp: 1 box with prn refills 100 each 11  . HYDROcodone-acetaminophen (NORCO) 10-325 MG per tablet Take 1 tablet by mouth every 6 (six) hours as needed for moderate pain.     Marland Kitchen ipratropium-albuterol (DUONEB) 0.5-2.5 (3) MG/3ML SOLN INHALE 1 VIAL VIA NEBULIZER EVERY 6 HOURS AS NEEDED 360 mL 0  . losartan (COZAAR) 50 MG tablet Take 1 tablet (50 mg total)  by mouth daily. 90 tablet 3  . metFORMIN (GLUCOPHAGE) 1000 MG tablet Take 1 tablet (1,000 mg total) by mouth 2 (two) times daily with a meal. 180 tablet 3  . oxymorphone (OPANA ER) 20 MG 12 hr tablet Take 20 mg by mouth every 12 (twelve) hours.    . Rivaroxaban (XARELTO) 15 MG TABS tablet Take 1 tablet (15 mg total) by mouth 2 (two) times daily with a meal. 28 tablet 0  . rOPINIRole (REQUIP) 2 MG tablet Take 1 tablet (2 mg total) by mouth at bedtime. 90 tablet 3  . tolterodine (DETROL LA) 2 MG 24 hr capsule TAKE 1 CAPSULE (2 MG TOTAL) BY MOUTH DAILY. 90 capsule 3  . zolpidem (AMBIEN) 10 MG tablet Take 1 tablet (10 mg total) by mouth at bedtime as needed for sleep. 15 tablet 1   No current facility-administered medications on file prior to visit.    Allergies  Allergen Reactions  . Guaifenesin & Derivatives     Severe Reaction  . Benazepril Other (See Comments)    cough  . Latex     REACTION: powder on gloves  . Morphine     REACTION: nausea  . Zyrtec [Cetirizine] Other (See Comments)    swelling   Social History   Socioeconomic History  . Marital status: Married    Spouse name: Not on file  . Number of children: 1  . Years of education: Not on file  . Highest education level: Not on file  Occupational History  . Occupation: Retired  Scientific laboratory technician  . Financial resource strain: Not on file  . Food insecurity:    Worry: Not on file    Inability: Not on file  . Transportation needs:    Medical: Not on file    Non-medical: Not on file  Tobacco Use  . Smoking status: Current Every Day Smoker    Types: Cigarettes  . Smokeless tobacco: Never Used  Substance and Sexual Activity  . Alcohol use: No    Alcohol/week: 0.0 standard drinks  . Drug use: No  . Sexual activity: Not on file  Lifestyle  .  Physical activity:    Days per week: Not on file    Minutes per session: Not on file  . Stress: Not on file  Relationships  . Social connections:    Talks on phone: Not on file     Gets together: Not on file    Attends religious service: Not on file    Active member of club or organization: Not on file    Attends meetings of clubs or organizations: Not on file    Relationship status: Not on file  . Intimate partner violence:    Fear of current or ex partner: Not on file    Emotionally abused: Not on file    Physically abused: Not on file    Forced sexual activity: Not on file  Other Topics Concern  . Not on file  Social History Narrative  . Not on file   Family History  Problem Relation Age of Onset  . Diabetes Mother   . Hemophilia Mother   . Diabetes Brother   . Breast cancer Sister   . Heart disease Father   . Colon polyps Father   . Colon polyps Sister       Review of Systems  All other systems reviewed and are negative.      Objective:   Physical Exam  Constitutional: She is oriented to person, place, and time. She appears well-developed and well-nourished. No distress.  Neck: Neck supple. No JVD present. No thyromegaly present.  Cardiovascular: Normal rate, regular rhythm and intact distal pulses.  Murmur heard. Pulmonary/Chest: Effort normal and breath sounds normal. No respiratory distress. She has no wheezes. She has no rales.  Abdominal: Soft. Bowel sounds are normal. She exhibits no distension. There is no tenderness. There is no rebound and no guarding.  Musculoskeletal: She exhibits no edema.       Right lower leg: She exhibits no tenderness, no swelling, no edema and no deformity.  Lymphadenopathy:    She has no cervical adenopathy.  Neurological: She is alert and oriented to person, place, and time. She exhibits normal muscle tone. Coordination normal.  Skin: She is not diaphoretic.  Vitals reviewed.         Assessment & Plan:  Need for prophylactic vaccination and inoculation against influenza - Plan: Flu Vaccine QUAD 36+ mos IM  RLS (restless legs syndrome)  Previous superficial venous thrombosis seems to be resolving  nicely.  Complete anticoagulation as directed.  Continue Requip 2 mg p.o. nightly.  Discontinue gabapentin and replaced with Lyrica 100 mg p.o. twice daily and reassess the patient in 2 to 3 weeks.  Consider amitriptyline versus symptoms persist.  Also begin iron sulfate 325 mg p.o. daily to correct underlying iron deficiency anemia as well

## 2018-05-12 ENCOUNTER — Encounter: Payer: Self-pay | Admitting: Vascular Surgery

## 2018-05-12 ENCOUNTER — Ambulatory Visit (INDEPENDENT_AMBULATORY_CARE_PROVIDER_SITE_OTHER): Payer: Medicare Other | Admitting: Vascular Surgery

## 2018-05-12 ENCOUNTER — Other Ambulatory Visit: Payer: Self-pay

## 2018-05-12 ENCOUNTER — Encounter

## 2018-05-12 ENCOUNTER — Ambulatory Visit (HOSPITAL_COMMUNITY)
Admission: RE | Admit: 2018-05-12 | Discharge: 2018-05-12 | Disposition: A | Discharge status: Home / Self Care | Payer: Medicare Other | Source: Ambulatory Visit | Attending: Vascular Surgery | Admitting: Vascular Surgery

## 2018-05-12 VITALS — BP 130/65 | HR 86 | Temp 97.6°F | Resp 18 | Ht 63.0 in | Wt 174.5 lb

## 2018-05-12 DIAGNOSIS — I83811 Varicose veins of right lower extremities with pain: Secondary | ICD-10-CM

## 2018-05-12 DIAGNOSIS — I82811 Embolism and thrombosis of superficial veins of right lower extremities: Secondary | ICD-10-CM | POA: Insufficient documentation

## 2018-05-12 DIAGNOSIS — I83893 Varicose veins of bilateral lower extremities with other complications: Secondary | ICD-10-CM | POA: Insufficient documentation

## 2018-05-12 DIAGNOSIS — R609 Edema, unspecified: Secondary | ICD-10-CM | POA: Diagnosis present

## 2018-05-12 DIAGNOSIS — E119 Type 2 diabetes mellitus without complications: Secondary | ICD-10-CM | POA: Diagnosis not present

## 2018-05-12 DIAGNOSIS — I868 Varicose veins of other specified sites: Secondary | ICD-10-CM

## 2018-05-12 LAB — HM DIABETES EYE EXAM

## 2018-05-12 NOTE — Progress Notes (Signed)
Referring Physician: Dr Dennard Schaumann  Patient name: [name] [name] MRN: [id number] DOB: [birth year] Sex: female  REASON FOR CONSULT: Symptomatic varicose veins with pain and thrombophlebitis right leg  HPI: [name] [name] is a 67 y.o. female, who developed a right leg thrombophlebitis near her right knee about 1 month ago.  She has never had a prior episode.  The pain from this is improving.  She was placed on Xarelto at that point.  She has a few weeks left on her Xarelto prescription.  She denies prior history of DVT.  She does have a family history of varicose veins in her brother.  She also complains of right foot and ankle swelling which has been intermittently present for about a year.  She currently does smoke about three quarters of a pack of cigarettes per day.  She is trying to quit.  Greater than 3 minutes today spent regarding smoking cessation counseling.  Other medical problems include coronary artery disease, renal insufficiency, diabetes, elevated cholesterol and hypertension.  These are all currently stable.  She is on Lyrica for restless leg syndromes.  She is also on a statin.  Past Medical History:  Diagnosis Date  . Anxiety   . Arthritis   . CAD (coronary artery disease)   . Chronic kidney disease    Renal insufficiency  . Depression   . Diabetes mellitus   . Diverticulosis of colon (without mention of hemorrhage)   . Duodenitis without mention of hemorrhage   . GERD (gastroesophageal reflux disease)   . Hemorrhoids   . Hypercholesteremia   . Hypertension   . Insomnia   . Tinnitus   . Vertigo    Past Surgical History:  Procedure Laterality Date  . ANGIOPLASTY    . CHOLECYSTECTOMY    . ROTATOR CUFF REPAIR Right 04/2012  . TUBAL LIGATION      Family History  Problem Relation Age of Onset  . Diabetes Mother   . Hemophilia Mother   . Diabetes Brother   . Breast cancer Sister   . Heart disease Father   . Colon polyps Father   . Colon polyps Sister      SOCIAL HISTORY: Social History   Socioeconomic History  . Marital status: Married    Spouse name: Not on file  . Number of children: 1  . Years of education: Not on file  . Highest education level: Not on file  Occupational History  . Occupation: Retired  Scientific laboratory technician  . Financial resource strain: Not on file  . Food insecurity:    Worry: Not on file    Inability: Not on file  . Transportation needs:    Medical: Not on file    Non-medical: Not on file  Tobacco Use  . Smoking status: Current Every Day Smoker    Types: Cigarettes  . Smokeless tobacco: Never Used  Substance and Sexual Activity  . Alcohol use: No    Alcohol/week: 0.0 standard drinks  . Drug use: No  . Sexual activity: Not on file  Lifestyle  . Physical activity:    Days per week: Not on file    Minutes per session: Not on file  . Stress: Not on file  Relationships  . Social connections:    Talks on phone: Not on file    Gets together: Not on file    Attends religious service: Not on file    Active member of club or organization: Not on file    Attends  meetings of clubs or organizations: Not on file    Relationship status: Not on file  . Intimate partner violence:    Fear of current or ex partner: Not on file    Emotionally abused: Not on file    Physically abused: Not on file    Forced sexual activity: Not on file  Other Topics Concern  . Not on file  Social History Narrative  . Not on file    Allergies  Allergen Reactions  . Guaifenesin & Derivatives     Severe Reaction  . Benazepril Other (See Comments)    cough  . Latex     REACTION: powder on gloves  . Morphine     REACTION: nausea  . Zyrtec [Cetirizine] Other (See Comments)    swelling    Current Outpatient Medications  Medication Sig Dispense Refill  . albuterol (PROVENTIL HFA;VENTOLIN HFA) 108 (90 Base) MCG/ACT inhaler Inhale 2 puffs into the lungs every 4 (four) hours as needed for wheezing or shortness of breath. 3  Inhaler 1  . atorvastatin (LIPITOR) 20 MG tablet Take 1 tablet (20 mg total) by mouth daily. 90 tablet 3  . bimatoprost (LUMIGAN) 0.03 % ophthalmic solution 1 drop at bedtime.    . cyclobenzaprine (FLEXERIL) 10 MG tablet Take 1 tablet (10 mg total) by mouth 3 (three) times daily as needed for muscle spasms. 90 tablet 2  . dexlansoprazole (DEXILANT) 60 MG capsule Take 1 capsule (60 mg total) by mouth daily. 90 capsule 3  . diltiazem (CARDIZEM) 120 MG tablet Take 1 tablet (120 mg total) by mouth daily. 90 tablet 3  . HYDROcodone-acetaminophen (NORCO) 10-325 MG per tablet Take 1 tablet by mouth every 6 (six) hours as needed for moderate pain.     Marland Kitchen ipratropium-albuterol (DUONEB) 0.5-2.5 (3) MG/3ML SOLN INHALE 1 VIAL VIA NEBULIZER EVERY 6 HOURS AS NEEDED 360 mL 0  . losartan (COZAAR) 50 MG tablet Take 1 tablet (50 mg total) by mouth daily. 90 tablet 3  . metFORMIN (GLUCOPHAGE) 1000 MG tablet Take 1 tablet (1,000 mg total) by mouth 2 (two) times daily with a meal. 180 tablet 3  . oxymorphone (OPANA ER) 20 MG 12 hr tablet Take 20 mg by mouth every 12 (twelve) hours.    . pregabalin (LYRICA) 100 MG capsule Take 1 capsule (100 mg total) by mouth 2 (two) times daily. 60 capsule 3  . Rivaroxaban (XARELTO) 15 MG TABS tablet Take 1 tablet (15 mg total) by mouth 2 (two) times daily with a meal. (Patient taking differently: Take 20 mg by mouth daily with supper. ) 28 tablet 0  . rOPINIRole (REQUIP) 2 MG tablet Take 1 tablet (2 mg total) by mouth at bedtime. 90 tablet 3  . tolterodine (DETROL LA) 2 MG 24 hr capsule TAKE 1 CAPSULE (2 MG TOTAL) BY MOUTH DAILY. 90 capsule 3  . zolpidem (AMBIEN) 10 MG tablet Take 1 tablet (10 mg total) by mouth at bedtime as needed for sleep. 15 tablet 1  . aspirin 325 MG tablet Take 1 tablet (325 mg total) by mouth daily. (Patient not taking: Reported on 05/12/2018) 90 tablet 3  . gabapentin (NEURONTIN) 300 MG capsule Take 1 capsule (300 mg total) by mouth 2 (two) times daily.  (Patient not taking: Reported on 05/12/2018) 180 capsule 3  . glucose blood (FREESTYLE LITE) test strip Pt checks BS bid - also needs lancets disp: 1 box with prn refills (Patient not taking: Reported on 05/12/2018) 100 each 11  No current facility-administered medications for this visit.     ROS:   General:  No weight loss, Fever, chills  HEENT: No recent headaches, no nasal bleeding, no visual changes, no sore throat  Neurologic: No dizziness, blackouts, seizures. No recent symptoms of stroke or mini- stroke. No recent episodes of slurred speech, or temporary blindness.  Cardiac: No recent episodes of chest pain/pressure, no shortness of breath at rest.  + shortness of breath with exertion.  Denies history of atrial fibrillation or irregular heartbeat  Vascular: No history of rest pain in feet.  No history of claudication.  No history of non-healing ulcer, No history of DVT   Pulmonary: No home oxygen, + productive cough, no hemoptysis,  No asthma or wheezing  Musculoskeletal:  [ ]  Arthritis, [ ]  Low back pain,  [ ]  Joint pain  Hematologic:No history of hypercoagulable state.  No history of easy bleeding.  No history of anemia  Gastrointestinal: No hematochezia or melena,  No gastroesophageal reflux, no trouble swallowing  Urinary: [X]  chronic Kidney disease, [ ]  on HD - [ ]  MWF or [ ]  TTHS, [ ]  Burning with urination, [ ]  Frequent urination, [ ]  Difficulty urinating;   Skin: No rashes  Psychological: No history of anxiety,  No history of depression   Physical Examination  Vitals:   05/12/18 0919  BP: 130/65  Pulse: 86  Resp: 18  Temp: 97.6 F (36.4 C)  TempSrc: Oral  SpO2: 97%  Weight: 174 lb 8 oz (79.2 kg)  Height: 5\' 3"  (1.6 m)    Body mass index is 30.91 kg/m.  General:  Alert and oriented, no acute distress HEENT: Normal Neck: No bruit or JVD Pulmonary: Clear to auscultation bilaterally Cardiac: Regular Rate and Rhythm without murmur Abdomen: Soft,  non-tender, non-distended, no mass Skin: No rash, scattered type spider type varicosities diffusely right and left legs palpable cord adjacent to the medial aspect right knee nontender not erythematous Extremity Pulses:  2+ radial, brachial, femoral, 2+ right dorsalis pedis, 2+ left posterior tibial pulse Musculoskeletal: No deformity or edema  Neurologic: Upper and lower extremity motor 5/5 and symmetric  DATA:  Patient had a venous reflux exam of her right leg today.  This showed evidence of deep vein reflux.  She also had diffuse reflux in the right greater saphenous vein in the superficial system surgery.  There was no evidence of DVT on the right or left leg.  Vein diameter was 4 to 5 mm throughout its course.  ASSESSMENT: Symptomatic varicose veins with pain swelling and now an episode of thrombophlebitis requiring anticoagulation.   PLAN: Patient was given a prescription today for long leg compression stockings for symptomatic relief.  I did discuss with her today the possibility of laser ablation of her right greater saphenous vein to prevent future episodes improve swelling and control pain symptoms.  She will return in 3 months time for follow-up and consideration of laser ablation.   Ruta Hinds, MD [facility] Office: [phone] Pager: [phone]

## 2018-05-17 ENCOUNTER — Encounter: Payer: Self-pay | Admitting: *Deleted

## 2018-05-24 ENCOUNTER — Telehealth: Payer: Self-pay | Admitting: Family Medicine

## 2018-05-24 NOTE — Telephone Encounter (Signed)
Pt called and states that she is having to take 2-3 pills of her Requip for her legs and would like to know if we could send in a new rx for an increased amount?

## 2018-05-25 NOTE — Telephone Encounter (Signed)
I would be willing to write a prescription for Requip 2 mg tablets, 2 tablets p.o. nightly.  I believe taking 4 mg at one time is the maximum safe dose.  They do not recommend going above 3 mg at 1 dose.  However the maximum daily allowance is 18 mg divided over several doses.  Therefore, I believe taking 4 mg at one time would be reasonable for her to do.

## 2018-05-31 NOTE — Telephone Encounter (Signed)
LMTRC

## 2018-06-02 NOTE — Telephone Encounter (Signed)
Pt aware of recommendations via vm and instructed to call me back if she would like the incresed dose.

## 2018-06-04 MED ORDER — ROPINIROLE HCL 2 MG PO TABS: 2.0000 mg | ORAL_TABLET | Freq: Three times a day (TID) | ORAL | 1 refills | Status: DC

## 2018-06-04 MED ORDER — ROPINIROLE HCL 2 MG PO TABS: 2.0000 mg | ORAL_TABLET | Freq: Three times a day (TID) | ORAL | 2 refills | Status: DC

## 2018-06-04 MED ORDER — ALBUTEROL SULFATE HFA 108 (90 BASE) MCG/ACT IN AERS: 2.0000 | INHALATION_SPRAY | RESPIRATORY_TRACT | 1 refills | Status: DC | PRN

## 2018-06-04 NOTE — Telephone Encounter (Signed)
Medication called/sent to requested pharmacy  

## 2018-06-04 NOTE — Telephone Encounter (Signed)
I think 2 mg tid would be ok.

## 2018-06-04 NOTE — Telephone Encounter (Signed)
Pt was taking 2 mg tid - I misunderstood what dose she was taking on the phone. Ok to refill for 2mg  tid?

## 2018-06-24 ENCOUNTER — Encounter (HOSPITAL_COMMUNITY): Payer: Medicare Other

## 2018-06-24 ENCOUNTER — Encounter: Payer: Medicare Other | Admitting: Vascular Surgery

## 2018-06-27 ENCOUNTER — Other Ambulatory Visit: Payer: Self-pay | Admitting: Family Medicine

## 2018-08-12 ENCOUNTER — Ambulatory Visit (INDEPENDENT_AMBULATORY_CARE_PROVIDER_SITE_OTHER): Payer: Medicare Other | Admitting: Family Medicine

## 2018-08-12 ENCOUNTER — Encounter (HOSPITAL_COMMUNITY): Payer: Self-pay | Admitting: Obstetrics and Gynecology

## 2018-08-12 ENCOUNTER — Inpatient Hospital Stay (HOSPITAL_COMMUNITY)
Admission: EM | Admit: 2018-08-12 | Discharge: 2018-08-16 | DRG: 190 | Disposition: A | Discharge status: Home / Self Care | Payer: Medicare Other | Attending: Internal Medicine | Admitting: Internal Medicine

## 2018-08-12 ENCOUNTER — Other Ambulatory Visit: Payer: Self-pay

## 2018-08-12 ENCOUNTER — Emergency Department (HOSPITAL_COMMUNITY): Payer: Medicare Other

## 2018-08-12 VITALS — HR 130 | Temp 98.1°F | Resp 26

## 2018-08-12 DIAGNOSIS — E119 Type 2 diabetes mellitus without complications: Secondary | ICD-10-CM | POA: Diagnosis not present

## 2018-08-12 DIAGNOSIS — J441 Chronic obstructive pulmonary disease with (acute) exacerbation: Principal | ICD-10-CM | POA: Diagnosis present

## 2018-08-12 DIAGNOSIS — E1122 Type 2 diabetes mellitus with diabetic chronic kidney disease: Secondary | ICD-10-CM | POA: Diagnosis present

## 2018-08-12 DIAGNOSIS — I129 Hypertensive chronic kidney disease with stage 1 through stage 4 chronic kidney disease, or unspecified chronic kidney disease: Secondary | ICD-10-CM | POA: Diagnosis present

## 2018-08-12 DIAGNOSIS — R630 Anorexia: Secondary | ICD-10-CM | POA: Diagnosis present

## 2018-08-12 DIAGNOSIS — Z79891 Long term (current) use of opiate analgesic: Secondary | ICD-10-CM | POA: Diagnosis not present

## 2018-08-12 DIAGNOSIS — R000 Tachycardia, unspecified: Secondary | ICD-10-CM | POA: Diagnosis present

## 2018-08-12 DIAGNOSIS — Z79899 Other long term (current) drug therapy: Secondary | ICD-10-CM | POA: Diagnosis not present

## 2018-08-12 DIAGNOSIS — I2583 Coronary atherosclerosis due to lipid rich plaque: Secondary | ICD-10-CM

## 2018-08-12 DIAGNOSIS — J9601 Acute respiratory failure with hypoxia: Secondary | ICD-10-CM | POA: Diagnosis present

## 2018-08-12 DIAGNOSIS — Z888 Allergy status to other drugs, medicaments and biological substances status: Secondary | ICD-10-CM | POA: Diagnosis not present

## 2018-08-12 DIAGNOSIS — Z7984 Long term (current) use of oral hypoglycemic drugs: Secondary | ICD-10-CM

## 2018-08-12 DIAGNOSIS — R069 Unspecified abnormalities of breathing: Secondary | ICD-10-CM | POA: Diagnosis not present

## 2018-08-12 DIAGNOSIS — G8929 Other chronic pain: Secondary | ICD-10-CM | POA: Diagnosis present

## 2018-08-12 DIAGNOSIS — Z91040 Latex allergy status: Secondary | ICD-10-CM | POA: Diagnosis not present

## 2018-08-12 DIAGNOSIS — E861 Hypovolemia: Secondary | ICD-10-CM | POA: Diagnosis present

## 2018-08-12 DIAGNOSIS — E872 Acidosis: Secondary | ICD-10-CM | POA: Diagnosis present

## 2018-08-12 DIAGNOSIS — R062 Wheezing: Secondary | ICD-10-CM | POA: Diagnosis not present

## 2018-08-12 DIAGNOSIS — I2781 Cor pulmonale (chronic): Secondary | ICD-10-CM | POA: Diagnosis present

## 2018-08-12 DIAGNOSIS — I2510 Atherosclerotic heart disease of native coronary artery without angina pectoris: Secondary | ICD-10-CM | POA: Diagnosis present

## 2018-08-12 DIAGNOSIS — Z885 Allergy status to narcotic agent status: Secondary | ICD-10-CM | POA: Diagnosis not present

## 2018-08-12 DIAGNOSIS — F17200 Nicotine dependence, unspecified, uncomplicated: Secondary | ICD-10-CM | POA: Diagnosis present

## 2018-08-12 DIAGNOSIS — R0689 Other abnormalities of breathing: Secondary | ICD-10-CM | POA: Diagnosis not present

## 2018-08-12 DIAGNOSIS — J80 Acute respiratory distress syndrome: Secondary | ICD-10-CM | POA: Diagnosis not present

## 2018-08-12 DIAGNOSIS — E871 Hypo-osmolality and hyponatremia: Secondary | ICD-10-CM | POA: Diagnosis present

## 2018-08-12 DIAGNOSIS — R0602 Shortness of breath: Secondary | ICD-10-CM | POA: Diagnosis not present

## 2018-08-12 DIAGNOSIS — N189 Chronic kidney disease, unspecified: Secondary | ICD-10-CM | POA: Diagnosis present

## 2018-08-12 DIAGNOSIS — R0600 Dyspnea, unspecified: Secondary | ICD-10-CM | POA: Diagnosis not present

## 2018-08-12 DIAGNOSIS — I10 Essential (primary) hypertension: Secondary | ICD-10-CM | POA: Diagnosis not present

## 2018-08-12 DIAGNOSIS — R0902 Hypoxemia: Secondary | ICD-10-CM

## 2018-08-12 DIAGNOSIS — E860 Dehydration: Secondary | ICD-10-CM | POA: Diagnosis present

## 2018-08-12 LAB — GLUCOSE, CAPILLARY: Glucose-Capillary: 187 mg/dL — ABNORMAL HIGH (ref 70–99)

## 2018-08-12 LAB — CBC WITH DIFFERENTIAL/PLATELET
ABS IMMATURE GRANULOCYTES: 0.11 10*3/uL — AB (ref 0.00–0.07)
Basophils Absolute: 0 10*3/uL (ref 0.0–0.1)
Basophils Relative: 0 %
Eosinophils Absolute: 0 10*3/uL (ref 0.0–0.5)
Eosinophils Relative: 0 %
HCT: 46.5 % — ABNORMAL HIGH (ref 36.0–46.0)
Hemoglobin: 14.2 g/dL (ref 12.0–15.0)
Immature Granulocytes: 1 %
Lymphocytes Relative: 12 %
Lymphs Abs: 1.4 10*3/uL (ref 0.7–4.0)
MCH: 28.3 pg (ref 26.0–34.0)
MCHC: 30.5 g/dL (ref 30.0–36.0)
MCV: 92.8 fL (ref 80.0–100.0)
Monocytes Absolute: 0.7 10*3/uL (ref 0.1–1.0)
Monocytes Relative: 5 %
Neutro Abs: 10.1 10*3/uL — ABNORMAL HIGH (ref 1.7–7.7)
Neutrophils Relative %: 82 %
Platelets: 248 10*3/uL (ref 150–400)
RBC: 5.01 MIL/uL (ref 3.87–5.11)
RDW: 17.4 % — ABNORMAL HIGH (ref 11.5–15.5)
WBC: 12.3 10*3/uL — ABNORMAL HIGH (ref 4.0–10.5)
nRBC: 0 % (ref 0.0–0.2)

## 2018-08-12 LAB — COMPREHENSIVE METABOLIC PANEL
ALT: 21 U/L (ref 0–44)
ANION GAP: 12 (ref 5–15)
AST: 26 U/L (ref 15–41)
Albumin: 4 g/dL (ref 3.5–5.0)
Alkaline Phosphatase: 50 U/L (ref 38–126)
BUN: 8 mg/dL (ref 8–23)
CO2: 27 mmol/L (ref 22–32)
Calcium: 8.6 mg/dL — ABNORMAL LOW (ref 8.9–10.3)
Chloride: 93 mmol/L — ABNORMAL LOW (ref 98–111)
Creatinine, Ser: 0.54 mg/dL (ref 0.44–1.00)
GFR calc non Af Amer: >60 mL/min (ref >60)
Glucose, Bld: 170 mg/dL — ABNORMAL HIGH (ref 70–99)
Potassium: 4 mmol/L (ref 3.5–5.1)
Sodium: 132 mmol/L — ABNORMAL LOW (ref 135–145)
Total Bilirubin: 0.2 mg/dL — ABNORMAL LOW (ref 0.3–1.2)
Total Protein: 8.4 g/dL — ABNORMAL HIGH (ref 6.5–8.1)

## 2018-08-12 LAB — PROTIME-INR
INR: 0.9
Prothrombin Time: 12.1 seconds (ref 11.4–15.2)

## 2018-08-12 LAB — I-STAT CG4 LACTIC ACID, ED: Lactic Acid, Venous: 2.98 mmol/L (ref 0.5–1.9)

## 2018-08-12 MED ORDER — INSULIN ASPART 100 UNIT/ML ~~LOC~~ SOLN: 0.0000 [IU] | Freq: Three times a day (TID) | SUBCUTANEOUS | Status: DC

## 2018-08-12 MED ORDER — DOXYCYCLINE HYCLATE 100 MG PO TABS
100.0000 mg | ORAL_TABLET | Freq: Two times a day (BID) | ORAL | Status: DC
  Administered 2018-08-12 – 2018-08-16 (×8): 100 mg via ORAL
  Filled 2018-08-12 (×9): qty 1

## 2018-08-12 MED ORDER — INSULIN ASPART 100 UNIT/ML ~~LOC~~ SOLN: 0.0000 [IU] | Freq: Every day | SUBCUTANEOUS | Status: DC

## 2018-08-12 MED ORDER — CYCLOBENZAPRINE HCL 10 MG PO TABS
10.0000 mg | ORAL_TABLET | Freq: Three times a day (TID) | ORAL | Status: DC | PRN
  Administered 2018-08-12 – 2018-08-13 (×2): 10 mg via ORAL
  Filled 2018-08-12 (×2): qty 1

## 2018-08-12 MED ORDER — ATORVASTATIN CALCIUM 20 MG PO TABS
20.0000 mg | ORAL_TABLET | Freq: Every day | ORAL | Status: DC
  Administered 2018-08-13 – 2018-08-16 (×4): 20 mg via ORAL
  Filled 2018-08-12 (×4): qty 1

## 2018-08-12 MED ORDER — ROPINIROLE HCL 1 MG PO TABS
2.0000 mg | ORAL_TABLET | Freq: Three times a day (TID) | ORAL | Status: DC
  Administered 2018-08-12 – 2018-08-16 (×10): 2 mg via ORAL
  Filled 2018-08-12 (×11): qty 2

## 2018-08-12 MED ORDER — METHYLPREDNISOLONE SODIUM SUCC 40 MG IJ SOLR
40.0000 mg | Freq: Four times a day (QID) | INTRAMUSCULAR | Status: AC
  Administered 2018-08-12 – 2018-08-13 (×4): 40 mg via INTRAVENOUS
  Filled 2018-08-12 (×4): qty 1

## 2018-08-12 MED ORDER — ALBUTEROL (5 MG/ML) CONTINUOUS INHALATION SOLN
10.0000 mg/h | INHALATION_SOLUTION | Freq: Once | RESPIRATORY_TRACT | Status: AC
  Administered 2018-08-12: 10 mg/h via RESPIRATORY_TRACT
  Filled 2018-08-12: qty 20

## 2018-08-12 MED ORDER — LOSARTAN POTASSIUM 50 MG PO TABS
50.0000 mg | ORAL_TABLET | Freq: Every day | ORAL | Status: DC
  Administered 2018-08-13 – 2018-08-16 (×4): 50 mg via ORAL
  Filled 2018-08-12 (×4): qty 1

## 2018-08-12 MED ORDER — PREDNISONE 20 MG PO TABS
40.0000 mg | ORAL_TABLET | Freq: Every day | ORAL | Status: DC
  Administered 2018-08-14 – 2018-08-15 (×2): 40 mg via ORAL
  Filled 2018-08-12: qty 2

## 2018-08-12 MED ORDER — DILTIAZEM HCL 60 MG PO TABS
120.0000 mg | ORAL_TABLET | Freq: Every day | ORAL | Status: DC
  Administered 2018-08-13 – 2018-08-16 (×4): 120 mg via ORAL
  Filled 2018-08-12 (×2): qty 2
  Filled 2018-08-12: qty 4
  Filled 2018-08-12: qty 2
  Filled 2018-08-12: qty 4
  Filled 2018-08-12: qty 2
  Filled 2018-08-12 (×2): qty 4

## 2018-08-12 MED ORDER — ALBUTEROL SULFATE (2.5 MG/3ML) 0.083% IN NEBU
2.5000 mg | INHALATION_SOLUTION | RESPIRATORY_TRACT | Status: DC | PRN
  Administered 2018-08-13 (×2): 2.5 mg via RESPIRATORY_TRACT
  Filled 2018-08-12 (×2): qty 3

## 2018-08-12 MED ORDER — IPRATROPIUM-ALBUTEROL 0.5-2.5 (3) MG/3ML IN SOLN
3.0000 mL | Freq: Four times a day (QID) | RESPIRATORY_TRACT | Status: DC
  Administered 2018-08-12 – 2018-08-13 (×3): 3 mL via RESPIRATORY_TRACT
  Filled 2018-08-12 (×3): qty 3

## 2018-08-12 MED ORDER — ENOXAPARIN SODIUM 40 MG/0.4ML ~~LOC~~ SOLN
40.0000 mg | SUBCUTANEOUS | Status: DC
  Administered 2018-08-12 – 2018-08-15 (×4): 40 mg via SUBCUTANEOUS
  Filled 2018-08-12 (×4): qty 0.4

## 2018-08-12 MED ORDER — HYDROCODONE-ACETAMINOPHEN 10-325 MG PO TABS
1.0000 | ORAL_TABLET | Freq: Four times a day (QID) | ORAL | Status: DC | PRN
  Administered 2018-08-13: 1 via ORAL
  Filled 2018-08-12: qty 1

## 2018-08-12 MED ORDER — INSULIN ASPART 100 UNIT/ML ~~LOC~~ SOLN
0.0000 [IU] | Freq: Three times a day (TID) | SUBCUTANEOUS | Status: DC
  Administered 2018-08-12 – 2018-08-13 (×3): 3 [IU] via SUBCUTANEOUS
  Administered 2018-08-13 – 2018-08-14 (×2): 2 [IU] via SUBCUTANEOUS
  Administered 2018-08-15: 5 [IU] via SUBCUTANEOUS
  Administered 2018-08-16: 3 [IU] via SUBCUTANEOUS

## 2018-08-12 MED ORDER — SODIUM CHLORIDE 0.9 % IV BOLUS
1000.0000 mL | Freq: Once | INTRAVENOUS | Status: AC
  Administered 2018-08-12: 1000 mL via INTRAVENOUS

## 2018-08-12 MED ORDER — PANTOPRAZOLE SODIUM 40 MG PO TBEC
40.0000 mg | DELAYED_RELEASE_TABLET | Freq: Every day | ORAL | Status: DC
  Administered 2018-08-13 – 2018-08-16 (×4): 40 mg via ORAL
  Filled 2018-08-12 (×4): qty 1

## 2018-08-12 MED ORDER — UMECLIDINIUM BROMIDE 62.5 MCG/INH IN AEPB
1.0000 | INHALATION_SPRAY | Freq: Every day | RESPIRATORY_TRACT | Status: DC
  Administered 2018-08-13 – 2018-08-16 (×4): 1 via RESPIRATORY_TRACT
  Filled 2018-08-12: qty 7

## 2018-08-12 NOTE — ED Notes (Signed)
Patient transported to X-ray 

## 2018-08-12 NOTE — ED Notes (Addendum)
Pt unable to ambulate at this time. Pt is breathing 25-32 RR and O2 saturation is 95 on 5L. Pt is still complaining of SOB and is utilizing accessory muscles during respirations. Will ambulate at a more appropriate time.

## 2018-08-12 NOTE — H&P (Signed)
History and Physical  Patient Name: [name] [name]     ZDG:[id number]    DOB: October 17, 1950    DOA: 08/12/2018 PCP: Susy Frizzle, MD  Patient coming from: Home --> PCP's office via ambulance  Chief Complaint: Dyspnea      HPI: [name] [name] is a 68 y.o. F DM, HTN, smoking, COPD not on O2, and nonobstructive CAD who presents with 1 week URI symptoms, now dyspnea and wheezing and hypoxia.  The patient was in her usual state of health (smokes daily, slightly dyspneic with exertion per PCP notes, but active, out of the house) until 1 week ago.  Developed sore throat followed by bad nasal congestion and cough.  Over the last week, she has had productive cough, progressively worsening dyspnea with exertion, and wheezing.  Last night at 2 AM this became so severe she could not breathe, called 9-1-1.  EMS administered albuterol but didn't transport.    This morning, she went to PCP's office where she was in severe respiratory distress by the time she got to the room, O2 saturation documented at 62%, sent via Ambulance and received albuterol and Solu-medrol 125 mg en route.  ED course: - Afebrile, heart rate 121, respirations 26, blood pressure 140/71, pulse ox 100% on nonrebreather -Na 132, K 4.0, Cr 0.5 (baseline 0.8), WBC 12.3K, Hgb 14.2 - Lactic acid 2.98 - INR normal -Chest x-ray without focal opacity or edema -ECG showed sinus tachycardia, cor pulmonale -She was given a continuous hour-long albuterol neb and able to wean off of nonrebreather, and IV fluids, and the hospital service were asked to evaluate for COPD exacerbation     ROS: Review of Systems  Constitutional: Negative for chills, fever and malaise/fatigue.  Respiratory: Positive for cough, sputum production, shortness of breath and wheezing. Negative for hemoptysis.   Cardiovascular: Negative for orthopnea, leg swelling and PND.  All other systems reviewed and are negative.         Past Medical History:  Diagnosis  Date  . Anxiety   . Arthritis   . CAD (coronary artery disease)   . Chronic kidney disease    Renal insufficiency  . Depression   . Diabetes mellitus   . Diverticulosis of colon (without mention of hemorrhage)   . Duodenitis without mention of hemorrhage   . GERD (gastroesophageal reflux disease)   . Hemorrhoids   . Hypercholesteremia   . Hypertension   . Insomnia   . Tinnitus   . Vertigo     Past Surgical History:  Procedure Laterality Date  . ANGIOPLASTY    . CHOLECYSTECTOMY    . ROTATOR CUFF REPAIR Right 04/2012  . TUBAL LIGATION      Social History: Patient lives with her husband.  The patient walks unassisted.  Active smoker.  Retired from working in Scientist, research (medical).  Denies alcohol use.  Allergies  Allergen Reactions  . Guaifenesin & Derivatives     Severe Reaction  . Benazepril Other (See Comments)    cough  . Latex     REACTION: powder on gloves  . Morphine     REACTION: nausea  . Zyrtec [Cetirizine] Other (See Comments)    swelling    Family history: family history includes Breast cancer in her sister; Colon polyps in her father and sister; Diabetes in her brother and mother; Heart disease in her father; Hemophilia in her mother.  Prior to Admission medications   Medication Sig Start Date End Date Taking? Authorizing Provider  albuterol (PROVENTIL  HFA;VENTOLIN HFA) 108 (90 Base) MCG/ACT inhaler Inhale 2 puffs into the lungs every 4 (four) hours as needed for wheezing or shortness of breath. 06/04/18  Yes Susy Frizzle, MD  atorvastatin (LIPITOR) 20 MG tablet Take 1 tablet (20 mg total) by mouth daily. 04/15/18  Yes Susy Frizzle, MD  bimatoprost (LUMIGAN) 0.03 % ophthalmic solution Place 1 drop into both eyes at bedtime.    Yes [provider]  cyclobenzaprine (FLEXERIL) 10 MG tablet Take 1 tablet (10 mg total) by mouth 3 (three) times daily as needed for muscle spasms. Patient taking differently: Take 10 mg by mouth 3 (three) times daily.  08/10/17   Yes Susy Frizzle, MD  dexlansoprazole (DEXILANT) 60 MG capsule Take 1 capsule (60 mg total) by mouth daily. 04/15/18  Yes Susy Frizzle, MD  diltiazem (CARDIZEM) 120 MG tablet Take 1 tablet (120 mg total) by mouth daily. 04/15/18  Yes Susy Frizzle, MD  HYDROcodone-acetaminophen (NORCO) 10-325 MG per tablet Take 1 tablet by mouth every 6 (six) hours as needed for moderate pain.    Yes [provider]  ipratropium-albuterol (DUONEB) 0.5-2.5 (3) MG/3ML SOLN INHALE 1 VIAL VIA NEBULIZER EVERY 6 HOURS AS NEEDED Patient taking differently: Inhale 3 mLs into the lungs every 6 (six) hours as needed (sob and wheezing). INHALE 1 VIAL VIA NEBULIZER EVERY 6 HOURS AS NEEDED 07/29/16  Yes Susy Frizzle, MD  losartan (COZAAR) 50 MG tablet Take 1 tablet (50 mg total) by mouth daily. 04/15/18  Yes Susy Frizzle, MD  metFORMIN (GLUCOPHAGE) 1000 MG tablet Take 1 tablet (1,000 mg total) by mouth 2 (two) times daily with a meal. 04/15/18  Yes Susy Frizzle, MD  rOPINIRole (REQUIP) 2 MG tablet TAKE 1 TABLET BY MOUTH THREE TIMES A DAY 06/28/18  Yes Susy Frizzle, MD  zolpidem (AMBIEN) 10 MG tablet Take 10 mg by mouth at bedtime as needed for sleep.   Yes [provider]  glucose blood (FREESTYLE LITE) test strip Pt checks BS bid - also needs lancets disp: 1 box with prn refills Patient not taking: Reported on 05/12/2018 01/06/13   Susy Frizzle, MD  oxymorphone (OPANA ER) 20 MG 12 hr tablet Take 20 mg by mouth every 12 (twelve) hours as needed for pain.     [provider]  zolpidem (AMBIEN) 10 MG tablet Take 1 tablet (10 mg total) by mouth at bedtime as needed for sleep. 04/15/18 05/15/18  Susy Frizzle, MD       Physical Exam: BP 127/71 (BP Location: Right Arm)   Pulse (!) 116   Temp 98.7 F (37.1 C) (Oral)   Resp (!) 32   Ht 5\' 4"  (1.626 m)   Wt 74.4 kg   SpO2 96%   BMI 28.15 kg/m  General appearance: Well-developed, adult female, alert and in moderate  respiratory distress.   Eyes: Anicteric, conjunctiva pink, lids and lashes normal. PERRL.    ENT: No nasal deformity, discharge, epistaxis.  Hearing normal. OP dry without lesions.  Lips normal, dentition good. Neck: No neck masses.  Trachea midline.  No thyromegaly/tenderness. Lymph: No cervical or supraclavicular lymphadenopathy. Skin: Warm and dry.  No jaundice.  No suspicious rashes or lesions. Cardiac: Tachycardic, regular, nl S1-S2, no murmurs appreciated.  Capillary refill is brisk.  JVP normal.  No LE edema.  Radial and DP pulses 2+ and symmetric. Respiratory: Tachypneic, pursed lip breathing, appears uncomfortable breathing, speaks in short sentences, wheezing bilaterally. Abdomen: Abdomen  soft.  No TTP. No ascites, distension, hepatosplenomegaly.   MSK: No deformities or effusions of the large joints of the upper lower extremities bilaterally.  Normal muscle bulk and tone.  No cyanosis or clubbing. Neuro: Cranial nerves 3 through 12 intact.  Sensation intact to light touch. Speech is fluent.  Muscle strength 5/5 and symmetric.    Psych: Sensorium intact and responding to questions, attention normal.  Behavior appropriate.  Affect blunted.  Judgment and insight appear normal.     Labs on Admission:  I have personally reviewed following labs and imaging studies: CBC: Recent Labs  Lab 08/12/18 1227  WBC 12.3*  NEUTROABS 10.1*  HGB 14.2  HCT 46.5*  MCV 92.8  PLT 270   Basic Metabolic Panel: Recent Labs  Lab 08/12/18 1227  NA 132*  K 4.0  CL 93*  CO2 27  GLUCOSE 170*  BUN 8  CREATININE 0.54  CALCIUM 8.6*   GFR: Estimated Creatinine Clearance: 65.6 mL/min (by C-G formula based on SCr of 0.54 mg/dL).  Liver Function Tests: Recent Labs  Lab 08/12/18 1227  AST 26  ALT 21  ALKPHOS 50  BILITOT 0.2*  PROT 8.4*  ALBUMIN 4.0    Coagulation Profile: Recent Labs  Lab 08/12/18 1227  INR 0.90   Sepsis Labs: Lactic acid 2.8       Radiological Exams on  Admission: Personally reviewed CXR shows no focal airspace disease or opacity: Dg Chest 2 View  Result Date: 08/12/2018 CLINICAL DATA:  Shortness of breath. EXAM: CHEST - 2 VIEW COMPARISON:  Radiographs of October 26, 2015. FINDINGS: The heart size and mediastinal contours are within normal limits. Atherosclerosis of thoracic aorta is noted. No pneumothorax or pleural effusion is noted. Both lungs are clear. The visualized skeletal structures are unremarkable. IMPRESSION: No active cardiopulmonary disease. Aortic Atherosclerosis (ICD10-I70.0). Electronically Signed   By: Marijo Conception, M.D.   On: 08/12/2018 12:38    EKG: Independently reviewed. Rate 120, QTc normal, cor pulmonale.    Assessment/Plan  Acute hypoxic respiratory failure from COPD exacerbation  No PFTs ever.  Has had prednisone for bronchitis before.  PCP notes indicate she has suspected COPD.  Still active smoker.   -IV Solu-Medrol -Scheduled and PRN bronchodilators -Start LAMA -Doxycycline given severe exacerbation and new sputum -Repeat lactic acid tomorrow     Hyponatremia  Appears dehydrated. -IV fluids and repeat BMP in AM  Hypertension Coronary disease  -Continue diltiazem and losartan -Continue Lipitor -Continue aspirin, reduce to 81 mg  Diabetes  -Hold home metformin -SSI correction insulin if needed  Smoking  Smoking cessation recommended, modalities discussed  Lactic acidosis  Likely from respiratory failure, CXR clear and no fever -->doubt sepsis.   -Albuterol, IV fluids, repeat Lactic acid in morning, low threshold to start antibiotics and check procalcitonin and repeat CXR if spikes fever.  Chronic pain -Continue home Norco at night -Continue cyclobenzaprine  Other medications -Continue PPI         DVT prophylaxis: Lovenox Code Status: Full code Family Communication: Son at the bedside Disposition Plan: Anticipate IV steroids, frequent bronchodilators, supplemental  oxygen. Consults called: None Admission status: Inpatient med surg    At the time of admission, it appears that the appropriate admission status for this patient is INPATIENT. This is judged to be reasonable and necessary in order to provide the required intensity of service to ensure the patient's safety given the presenting symptoms (dyspnea, wheezing, respiratory distress), physical exam findings, and initial radiographic and laboratory data (  tachypnea >25 per min, pulse ox 65%, elevated lactic acid) in the context of their chronic comorbidities.  Together, these circumstances are felt to place her at high risk for further clinical deterioration threatening life, limb, or organ.   Patient requires inpatient status due to high intensity of service, high risk for further deterioration and high frequency of surveillance required because of this severe exacerbation of their chronic organ failure.  I certify that at the point of admission it is my clinical judgment that the patient will require inpatient hospital care spanning beyond 2 midnights from the point of admission and that early discharge would result in unnecessary risk of decompensation and readmission or threat to life, limb or bodily function.      Medical decision making: Patient seen at 4:00 PM on 08/12/2018.  The patient was discussed with Dr. Eulis Foster.  What exists of the patient's chart was reviewed in depth and summarized above.  Clinical condition: improved with albuterol, mentation normal, hemodynamically improving and respiratory stauts improved on 5L o2.        Edwin Dada [facility] Pager [phone]

## 2018-08-12 NOTE — Progress Notes (Signed)
Subjective:    Patient ID: [name] [name], female    DOB: 06/12/1951, 68 y.o.   MRN: [id number]  Patient is a 68 year old white female who is brought in today by her son and respiratory distress.  She has labored breathing with apnea.  Respiratory rate is 26 breaths/min with accessory muscle use.  Patient appears to be tiring out.  She is given herself 3 nebulizer treatments this morning and the last 2 hours with no relief.  Apparently symptoms began 2 days ago with a "cold" and rapidly progressed to the point last night at 2:30 in the morning, they called 911.  Patient was given a breathing treatment and at that time improved and therefore was determined not to go to the hospital.  However this morning the patient is in obvious respiratory distress.  She is placed on 4 L.  Pulse oximetry increases to 95% however the patient continues to have labored breathing and appears extremely fatigued.  She is given a Combineb, 2.5 mg albuterol and 0.5 mg of Atrovent x1 and 911 is called.  911 is on the scene within 5 minutes and is assuming care for transport to the ER. Past Medical History:  Diagnosis Date  . Anxiety   . Arthritis   . CAD (coronary artery disease)   . Chronic kidney disease    Renal insufficiency  . Depression   . Diabetes mellitus   . Diverticulosis of colon (without mention of hemorrhage)   . Duodenitis without mention of hemorrhage   . GERD (gastroesophageal reflux disease)   . Hemorrhoids   . Hypercholesteremia   . Hypertension   . Insomnia   . Tinnitus   . Vertigo    Past Surgical History:  Procedure Laterality Date  . ANGIOPLASTY    . CHOLECYSTECTOMY    . ROTATOR CUFF REPAIR Right 04/2012  . TUBAL LIGATION     Current Outpatient Medications on File Prior to Visit  Medication Sig Dispense Refill  . albuterol (PROVENTIL HFA;VENTOLIN HFA) 108 (90 Base) MCG/ACT inhaler Inhale 2 puffs into the lungs every 4 (four) hours as needed for wheezing or shortness of breath. 3  Inhaler 1  . aspirin 325 MG tablet Take 1 tablet (325 mg total) by mouth daily. (Patient not taking: Reported on 05/12/2018) 90 tablet 3  . atorvastatin (LIPITOR) 20 MG tablet Take 1 tablet (20 mg total) by mouth daily. 90 tablet 3  . bimatoprost (LUMIGAN) 0.03 % ophthalmic solution 1 drop at bedtime.    . cyclobenzaprine (FLEXERIL) 10 MG tablet Take 1 tablet (10 mg total) by mouth 3 (three) times daily as needed for muscle spasms. 90 tablet 2  . dexlansoprazole (DEXILANT) 60 MG capsule Take 1 capsule (60 mg total) by mouth daily. 90 capsule 3  . diltiazem (CARDIZEM) 120 MG tablet Take 1 tablet (120 mg total) by mouth daily. 90 tablet 3  . gabapentin (NEURONTIN) 300 MG capsule Take 1 capsule (300 mg total) by mouth 2 (two) times daily. (Patient not taking: Reported on 05/12/2018) 180 capsule 3  . glucose blood (FREESTYLE LITE) test strip Pt checks BS bid - also needs lancets disp: 1 box with prn refills (Patient not taking: Reported on 05/12/2018) 100 each 11  . HYDROcodone-acetaminophen (NORCO) 10-325 MG per tablet Take 1 tablet by mouth every 6 (six) hours as needed for moderate pain.     Marland Kitchen ipratropium-albuterol (DUONEB) 0.5-2.5 (3) MG/3ML SOLN INHALE 1 VIAL VIA NEBULIZER EVERY 6 HOURS AS NEEDED 360 mL 0  .  losartan (COZAAR) 50 MG tablet Take 1 tablet (50 mg total) by mouth daily. 90 tablet 3  . metFORMIN (GLUCOPHAGE) 1000 MG tablet Take 1 tablet (1,000 mg total) by mouth 2 (two) times daily with a meal. 180 tablet 3  . oxymorphone (OPANA ER) 20 MG 12 hr tablet Take 20 mg by mouth every 12 (twelve) hours.    . pregabalin (LYRICA) 100 MG capsule Take 1 capsule (100 mg total) by mouth 2 (two) times daily. 60 capsule 3  . Rivaroxaban (XARELTO) 15 MG TABS tablet Take 1 tablet (15 mg total) by mouth 2 (two) times daily with a meal. (Patient taking differently: Take 20 mg by mouth daily with supper. ) 28 tablet 0  . rOPINIRole (REQUIP) 2 MG tablet Take 1 tablet (2 mg total) by mouth 3 (three) times daily.  270 tablet 2  . rOPINIRole (REQUIP) 2 MG tablet TAKE 1 TABLET BY MOUTH THREE TIMES A DAY 90 tablet 5  . tolterodine (DETROL LA) 2 MG 24 hr capsule TAKE 1 CAPSULE (2 MG TOTAL) BY MOUTH DAILY. 90 capsule 3  . zolpidem (AMBIEN) 10 MG tablet Take 1 tablet (10 mg total) by mouth at bedtime as needed for sleep. 15 tablet 1   No current facility-administered medications on file prior to visit.    Allergies  Allergen Reactions  . Guaifenesin & Derivatives     Severe Reaction  . Benazepril Other (See Comments)    cough  . Latex     REACTION: powder on gloves  . Morphine     REACTION: nausea  . Zyrtec [Cetirizine] Other (See Comments)    swelling   Social History   Socioeconomic History  . Marital status: Married    Spouse name: Not on file  . Number of children: 1  . Years of education: Not on file  . Highest education level: Not on file  Occupational History  . Occupation: Retired  Scientific laboratory technician  . Financial resource strain: Not on file  . Food insecurity:    Worry: Not on file    Inability: Not on file  . Transportation needs:    Medical: Not on file    Non-medical: Not on file  Tobacco Use  . Smoking status: Current Every Day Smoker    Types: Cigarettes  . Smokeless tobacco: Never Used  Substance and Sexual Activity  . Alcohol use: No    Alcohol/week: 0.0 standard drinks  . Drug use: No  . Sexual activity: Not on file  Lifestyle  . Physical activity:    Days per week: Not on file    Minutes per session: Not on file  . Stress: Not on file  Relationships  . Social connections:    Talks on phone: Not on file    Gets together: Not on file    Attends religious service: Not on file    Active member of club or organization: Not on file    Attends meetings of clubs or organizations: Not on file    Relationship status: Not on file  . Intimate partner violence:    Fear of current or ex partner: Not on file    Emotionally abused: Not on file    Physically abused: Not on  file    Forced sexual activity: Not on file  Other Topics Concern  . Not on file  Social History Narrative  . Not on file   Family History  Problem Relation Age of Onset  . Diabetes Mother   .  Hemophilia Mother   . Diabetes Brother   . Breast cancer Sister   . Heart disease Father   . Colon polyps Father   . Colon polyps Sister       Review of Systems  All other systems reviewed and are negative.      Objective:   Physical Exam  Constitutional: She is oriented to person, place, and time. She appears well-developed and well-nourished. She appears distressed.  Neck: Neck supple. No JVD present. No thyromegaly present.  Cardiovascular: Regular rhythm and intact distal pulses. Tachycardia present.  Murmur heard. Pulmonary/Chest: Tachypnea noted. She is in respiratory distress. She has decreased breath sounds in the right upper field, the right middle field, the right lower field, the left upper field, the left middle field and the left lower field. She has wheezes in the right upper field, the right middle field, the right lower field, the left upper field, the left middle field and the left lower field. She has rhonchi in the right upper field, the right middle field, the right lower field, the left upper field, the left middle field and the left lower field.  Abdominal: Soft. Bowel sounds are normal. She exhibits no distension. There is no abdominal tenderness. There is no rebound and no guarding.  Musculoskeletal:        General: No edema.     Right lower leg: She exhibits no tenderness, no swelling and no deformity. No edema.  Lymphadenopathy:    She has no cervical adenopathy.  Neurological: She is alert and oriented to person, place, and time. She exhibits normal muscle tone. Coordination normal.  Skin: She is not diaphoretic.  Vitals reviewed.         Assessment & Plan:  Acute hypoxic respiratory failure apparently due to a COPD exacerbation unable to exclude  possible pneumonia  Patient requires EMS transport to the emergency room.  Patient appears to be tiring and may be nearing the need for intubation vs bipap.  Cannot exclude underlying based on pulmonary exam alone.  Patient definitely requires bronchodilator therapy, steroids, and pulmonary support

## 2018-08-12 NOTE — ED Notes (Signed)
ED TO INPATIENT HANDOFF REPORT  Name/Age/Gender [name] [name] 68 y.o. female  Code Status    Code Status Orders  (From admission, onward)         Start     Ordered   08/12/18 1506  Full code  Continuous     08/12/18 1509        Code Status History    Date Active Date Inactive Code Status Order ID Comments User Context   10/26/2015 1316 10/28/2015 1517 Full Code [id number]  Radene Gunning, NP ED      Home/SNF/Other Home  Chief Complaint SOB  Level of Care/Admitting Diagnosis ED Disposition    ED Disposition Condition [place]: [facility] [[id number]]  Level of Care: Med-Surg [16]  Diagnosis: COPD exacerbation [facility]) [[id number]]  Admitting Physician: Edwin Dada [[id number]]  Attending Physician: Bartholomew Boards  Estimated length of stay: past midnight tomorrow  Certification:: I certify this patient will need inpatient services for at least 2 midnights  PT Class (Do Not Modify): Inpatient [101]  PT Acc Code (Do Not Modify): Private [1]       Medical History Past Medical History:  Diagnosis Date  . Anxiety   . Arthritis   . CAD (coronary artery disease)   . Chronic kidney disease    Renal insufficiency  . Depression   . Diabetes mellitus   . Diverticulosis of colon (without mention of hemorrhage)   . Duodenitis without mention of hemorrhage   . GERD (gastroesophageal reflux disease)   . Hemorrhoids   . Hypercholesteremia   . Hypertension   . Insomnia   . Tinnitus   . Vertigo     Allergies Allergies  Allergen Reactions  . Guaifenesin & Derivatives     Severe Reaction  . Benazepril Other (See Comments)    cough  . Latex     REACTION: powder on gloves  . Morphine     REACTION: nausea  . Zyrtec [Cetirizine] Other (See Comments)    swelling    IV Location/Drains/Wounds Patient Lines/Drains/Airways Status   Active Line/Drains/Airways    Name:   Placement date:   Placement time:    Site:   Days:   Peripheral IV 08/12/18 Left Antecubital   08/12/18    1227    Antecubital   less than 1   Peripheral IV 08/12/18 Right Forearm   08/12/18    1254    Forearm   less than 1          Labs/Imaging Results for orders placed or performed during the hospital encounter of 08/12/18 (from the past 48 hour(s))  Comprehensive metabolic panel     Status: Abnormal   Collection Time: 08/12/18 12:27 PM  Result Value Ref Range   Sodium 132 (L) 135 - 145 mmol/L   Potassium 4.0 3.5 - 5.1 mmol/L   Chloride 93 (L) 98 - 111 mmol/L   CO2 27 22 - 32 mmol/L   Glucose, Bld 170 (H) 70 - 99 mg/dL   BUN 8 8 - 23 mg/dL   Creatinine, Ser 0.54 0.44 - 1.00 mg/dL   Calcium 8.6 (L) 8.9 - 10.3 mg/dL   Total Protein 8.4 (H) 6.5 - 8.1 g/dL   Albumin 4.0 3.5 - 5.0 g/dL   AST 26 15 - 41 U/L   ALT 21 0 - 44 U/L   Alkaline Phosphatase 50 38 - 126 U/L   Total Bilirubin 0.2 (L) 0.3 -  1.2 mg/dL   GFR calc non Af Amer >60 >60 mL/min   GFR calc Af Amer >60 >60 mL/min   Anion gap 12 5 - 15    Comment: Performed at [facility], [place] [address], [place], [place] [address]  CBC with Differential     Status: Abnormal   Collection Time: 08/12/18 12:27 PM  Result Value Ref Range   WBC 12.3 (H) 4.0 - 10.5 K/uL   RBC 5.01 3.87 - 5.11 MIL/uL   Hemoglobin 14.2 12.0 - 15.0 g/dL   HCT 46.5 (H) 36.0 - 46.0 %   MCV 92.8 80.0 - 100.0 fL   MCH 28.3 26.0 - 34.0 pg   MCHC 30.5 30.0 - 36.0 g/dL   RDW 17.4 (H) 11.5 - 15.5 %   Platelets 248 150 - 400 K/uL   nRBC 0.0 0.0 - 0.2 %   Neutrophils Relative % 82 %   Neutro Abs 10.1 (H) 1.7 - 7.7 K/uL   Lymphocytes Relative 12 %   Lymphs Abs 1.4 0.7 - 4.0 K/uL   Monocytes Relative 5 %   Monocytes Absolute 0.7 0.1 - 1.0 K/uL   Eosinophils Relative 0 %   Eosinophils Absolute 0.0 0.0 - 0.5 K/uL   Basophils Relative 0 %   Basophils Absolute 0.0 0.0 - 0.1 K/uL   Immature Granulocytes 1 %   Abs Immature Granulocytes 0.11 (H) 0.00 - 0.07 K/uL    Comment:  Performed at [facility], [place] [address] [place], [place] [address]  Protime-INR     Status: None   Collection Time: 08/12/18 12:27 PM  Result Value Ref Range   Prothrombin Time 12.1 11.4 - 15.2 seconds   INR 0.90     Comment: Performed at [facility], [place] [address], [place], [place] [address]  I-Stat CG4 Lactic Acid, ED     Status: Abnormal   Collection Time: 08/12/18 12:45 PM  Result Value Ref Range   Lactic Acid, Venous 2.98 (HH) 0.5 - 1.9 mmol/L   Comment NOTIFIED PHYSICIAN    Dg Chest 2 View  Result Date: 08/12/2018 CLINICAL DATA:  Shortness of breath. EXAM: CHEST - 2 VIEW COMPARISON:  Radiographs of October 26, 2015. FINDINGS: The heart size and mediastinal contours are within normal limits. Atherosclerosis of thoracic aorta is noted. No pneumothorax or pleural effusion is noted. Both lungs are clear. The visualized skeletal structures are unremarkable. IMPRESSION: No active cardiopulmonary disease. Aortic Atherosclerosis (ICD10-I70.0). Electronically Signed   By: Marijo Conception, M.D.   On: 08/12/2018 12:38   EKG Interpretation  Date/Time:  Thursday August 12 2018 12:19:18 EST Ventricular Rate:  120 PR Interval:    QRS Duration: 103 QT Interval:  339 QTC Calculation: 479 R Axis:   109 Text Interpretation:  Sinus tachycardia Ventricular premature complex LAE, consider biatrial enlargement Probable lateral infarct, age indeterminate Baseline wander in lead(s) II III aVF V2 V3 V5 Since last tracing rate faster Confirmed by Daleen Bo [phone]) on 08/12/2018 12:58:50 PM   Pending Labs Unresulted Labs (From admission, onward)    Start     Ordered   08/19/18 0500  Creatinine, serum  (enoxaparin (LOVENOX)    CrCl >/= 30 ml/min)  Weekly,   R    Comments:  while on enoxaparin therapy    08/12/18 1509   08/13/18 0500  HIV antibody (Routine Testing)  Tomorrow morning,   R     08/12/18 1509   08/13/18 0500  HIV antibody  Tomorrow morning,   R      08/12/18 1509   08/13/18 0500  CBC  Tomorrow morning,   R     08/12/18 1509   08/13/18 8182  Basic metabolic panel  Tomorrow morning,   R     08/12/18 1509   08/13/18 0500  Lactic acid, plasma  Tomorrow morning,   R     08/12/18 1509   08/12/18 1227  Culture, blood (Routine x 2)  BLOOD CULTURE X 2,   STAT     08/12/18 1227          Vitals/Pain Today's Vitals   08/12/18 1219 08/12/18 1220 08/12/18 1256 08/12/18 1514  BP: 140/71   127/71  Pulse: (!) 121   (!) 116  Resp: (!) 26   (!) 32  Temp: 98.8 F (37.1 C)   98.7 F (37.1 C)  TempSrc: Oral   Oral  SpO2: 100%  100% 96%  Weight:  74.4 kg    Height:  5\' 4"  (1.626 m)    PainSc:  0-No pain      Isolation Precautions No active isolations  Medications Medications  enoxaparin (LOVENOX) injection 40 mg (has no administration in time range)  doxycycline (VIBRA-TABS) tablet 100 mg (has no administration in time range)  methylPREDNISolone sodium succinate (SOLU-MEDROL) 40 mg/mL injection 40 mg (has no administration in time range)    Followed by  predniSONE (DELTASONE) tablet 40 mg (has no administration in time range)  umeclidinium bromide (INCRUSE ELLIPTA) 62.5 MCG/INH 1 puff (has no administration in time range)  ipratropium-albuterol (DUONEB) 0.5-2.5 (3) MG/3ML nebulizer solution 3 mL (has no administration in time range)  albuterol (PROVENTIL) (2.5 MG/3ML) 0.083% nebulizer solution 2.5 mg (has no administration in time range)  HYDROcodone-acetaminophen (NORCO) 10-325 MG per tablet 1 tablet (has no administration in time range)  atorvastatin (LIPITOR) tablet 20 mg (has no administration in time range)  diltiazem (CARDIZEM) tablet 120 mg (has no administration in time range)  losartan (COZAAR) tablet 50 mg (has no administration in time range)  pantoprazole (PROTONIX) EC tablet 40 mg (has no administration in time range)  cyclobenzaprine (FLEXERIL) tablet 10 mg (has no administration in time range)  rOPINIRole (REQUIP)  tablet 2 mg (has no administration in time range)  insulin aspart (novoLOG) injection 0-9 Units (has no administration in time range)  insulin aspart (novoLOG) injection 0-5 Units (has no administration in time range)  albuterol (PROVENTIL,VENTOLIN) solution continuous neb (10 mg/hr Nebulization Given 08/12/18 1253)  sodium chloride 0.9 % bolus 1,000 mL (1,000 mLs Intravenous New Bag/Given 08/12/18 1315)    Mobility walks

## 2018-08-12 NOTE — ED Provider Notes (Signed)
[place] DEPT Provider Note   CSN: [id number] Arrival date & time: 08/12/18  1202     History   Chief Complaint Chief Complaint  Patient presents with  . Shortness of Breath    HPI [name] [name] is a 68 y.o. female.  HPI   He states she has been ill for 2 days with cough productive of yellow sputum.  She is a smoker but does not have ongoing lung problems.  She called EMS to her home last night at 2 AM they treated her with a nebulizer but she did was not transported.  She presented to her doctor's office today for a follow-up visit and was found to be hypoxic.  They called EMS and transferred her here for evaluation.  During transport she received 15 mg of albuterol, 1 mg of Atrovent, 125 mg of Solu-Medrol and 2 grams of magnesium.  In the ED, on oxygen by facemask, she has an oxygen saturation of 100%.  Denies recent fever, chills, nausea, vomiting, weakness or paresthesia.  She is here with her son.  There are no other known modifying factors.  Past Medical History:  Diagnosis Date  . Anxiety   . Arthritis   . CAD (coronary artery disease)   . Chronic kidney disease    Renal insufficiency  . Depression   . Diabetes mellitus   . Diverticulosis of colon (without mention of hemorrhage)   . Duodenitis without mention of hemorrhage   . GERD (gastroesophageal reflux disease)   . Hemorrhoids   . Hypercholesteremia   . Hypertension   . Insomnia   . Tinnitus   . Vertigo     Patient Active Problem List   Diagnosis Date Noted  . COPD exacerbation ([place]) 08/12/2018  . Hyponatremia 08/12/2018  . Acute respiratory failure with hypoxia ([place]) 10/26/2015  . CAP (community acquired pneumonia) 10/26/2015  . Diabetes ([place]) 10/26/2015  . Overactive bladder 09/10/2015  . Type II or unspecified type diabetes mellitus without mention of complication, not stated as uncontrolled   . Depression   . Insomnia   . GERD (gastroesophageal reflux disease)   .  Hypercholesteremia   . Hypertension   . Chronic kidney disease   . Overweight 10/29/2009  . GERD 06/20/2008  . CHEST PAIN 06/20/2008  . CHOLECYSTECTOMY, HX OF 06/20/2008  . HYPERLIPIDEMIA 12/11/2007  . TOBACCO ABUSE 12/11/2007  . DEPRESSION 12/11/2007  . Essential hypertension 12/11/2007  . Coronary atherosclerosis 12/11/2007  . DUODENITIS, WITH HEMORRHAGE 12/11/2007    Past Surgical History:  Procedure Laterality Date  . ANGIOPLASTY    . CHOLECYSTECTOMY    . ROTATOR CUFF REPAIR Right 04/2012  . TUBAL LIGATION       OB History   No obstetric history on file.      Home Medications    Prior to Admission medications   Medication Sig Start Date End Date Taking? Authorizing Provider  albuterol (PROVENTIL HFA;VENTOLIN HFA) 108 (90 Base) MCG/ACT inhaler Inhale 2 puffs into the lungs every 4 (four) hours as needed for wheezing or shortness of breath. 06/04/18  Yes Susy Frizzle, MD  atorvastatin (LIPITOR) 20 MG tablet Take 1 tablet (20 mg total) by mouth daily. 04/15/18  Yes Susy Frizzle, MD  bimatoprost (LUMIGAN) 0.03 % ophthalmic solution Place 1 drop into both eyes at bedtime.    Yes [provider]  cyclobenzaprine (FLEXERIL) 10 MG tablet Take 1 tablet (10 mg total) by mouth 3 (three) times daily as needed for  muscle spasms. Patient taking differently: Take 10 mg by mouth 3 (three) times daily.  08/10/17  Yes Susy Frizzle, MD  dexlansoprazole (DEXILANT) 60 MG capsule Take 1 capsule (60 mg total) by mouth daily. 04/15/18  Yes Susy Frizzle, MD  diltiazem (CARDIZEM) 120 MG tablet Take 1 tablet (120 mg total) by mouth daily. 04/15/18  Yes Susy Frizzle, MD  HYDROcodone-acetaminophen (NORCO) 10-325 MG per tablet Take 1 tablet by mouth every 6 (six) hours as needed for moderate pain.    Yes [provider]  ipratropium-albuterol (DUONEB) 0.5-2.5 (3) MG/3ML SOLN INHALE 1 VIAL VIA NEBULIZER EVERY 6 HOURS AS NEEDED Patient taking differently: Inhale 3  mLs into the lungs every 6 (six) hours as needed (sob and wheezing). INHALE 1 VIAL VIA NEBULIZER EVERY 6 HOURS AS NEEDED 07/29/16  Yes Susy Frizzle, MD  losartan (COZAAR) 50 MG tablet Take 1 tablet (50 mg total) by mouth daily. 04/15/18  Yes Susy Frizzle, MD  metFORMIN (GLUCOPHAGE) 1000 MG tablet Take 1 tablet (1,000 mg total) by mouth 2 (two) times daily with a meal. 04/15/18  Yes Susy Frizzle, MD  rOPINIRole (REQUIP) 2 MG tablet TAKE 1 TABLET BY MOUTH THREE TIMES A DAY 06/28/18  Yes Susy Frizzle, MD  zolpidem (AMBIEN) 10 MG tablet Take 10 mg by mouth at bedtime as needed for sleep.   Yes [provider]  glucose blood (FREESTYLE LITE) test strip Pt checks BS bid - also needs lancets disp: 1 box with prn refills Patient not taking: Reported on 05/12/2018 01/06/13   Susy Frizzle, MD  oxymorphone (OPANA ER) 20 MG 12 hr tablet Take 20 mg by mouth every 12 (twelve) hours as needed for pain.     [provider]  zolpidem (AMBIEN) 10 MG tablet Take 1 tablet (10 mg total) by mouth at bedtime as needed for sleep. 04/15/18 05/15/18  Susy Frizzle, MD    Family History Family History  Problem Relation Age of Onset  . Diabetes Mother   . Hemophilia Mother   . Breast cancer Sister   . Heart disease Father   . Colon polyps Father   . Diabetes Brother   . Colon polyps Sister     Social History Social History   Tobacco Use  . Smoking status: Current Every Day Smoker    Types: Cigarettes  . Smokeless tobacco: Never Used  Substance Use Topics  . Alcohol use: No    Alcohol/week: 0.0 standard drinks  . Drug use: No     Allergies   Guaifenesin & derivatives; Benazepril; Latex; Morphine; and Zyrtec [cetirizine]   Review of Systems Review of Systems  All other systems reviewed and are negative.    Physical Exam Updated Vital Signs BP 140/71 (BP Location: Right Arm)   Pulse (!) 121   Temp 98.8 F (37.1 C) (Oral)   Resp (!) 26   Ht 5\' 4"  (1.626 m)    Wt 74.4 kg   SpO2 100%   BMI 28.15 kg/m   Physical Exam Vitals signs and nursing note reviewed.  Constitutional:      General: She is in acute distress.     Appearance: She is well-developed. She is obese. She is ill-appearing. She is not toxic-appearing or diaphoretic.     Interventions: She is not intubated. HENT:     Head: Normocephalic and atraumatic.     Right Ear: External ear normal.     Left Ear: External ear normal.  Eyes:     Conjunctiva/sclera: Conjunctivae normal.     Pupils: Pupils are equal, round, and reactive to light.  Neck:     Musculoskeletal: Normal range of motion and neck supple.     Trachea: Phonation normal.  Cardiovascular:     Rate and Rhythm: Normal rate and regular rhythm.     Heart sounds: Normal heart sounds.  Pulmonary:     Effort: Tachypnea, accessory muscle usage and respiratory distress present. She is not intubated.     Breath sounds: No stridor. Decreased breath sounds, wheezing and rhonchi present. No rales.  Abdominal:     Palpations: Abdomen is soft.     Tenderness: There is no abdominal tenderness.  Musculoskeletal: Normal range of motion.     Right lower leg: She exhibits no tenderness.     Left lower leg: She exhibits no tenderness.  Skin:    General: Skin is warm and dry.  Neurological:     Mental Status: She is alert and oriented to person, place, and time.     Cranial Nerves: No cranial nerve deficit.     Sensory: No sensory deficit.     Motor: No abnormal muscle tone.     Coordination: Coordination normal.  Psychiatric:        Mood and Affect: Mood normal. Mood is not anxious.        Behavior: Behavior normal. Behavior is not agitated.        Thought Content: Thought content normal.        Judgment: Judgment normal.      ED Treatments / Results  Labs (all labs ordered are listed, but only abnormal results are displayed) Labs Reviewed  COMPREHENSIVE METABOLIC PANEL - Abnormal; Notable for the following components:       Result Value   Sodium 132 (*)    Chloride 93 (*)    Glucose, Bld 170 (*)    Calcium 8.6 (*)    Total Protein 8.4 (*)    Total Bilirubin 0.2 (*)    All other components within normal limits  CBC WITH DIFFERENTIAL/PLATELET - Abnormal; Notable for the following components:   WBC 12.3 (*)    HCT 46.5 (*)    RDW 17.4 (*)    Neutro Abs 10.1 (*)    Abs Immature Granulocytes 0.11 (*)    All other components within normal limits  I-STAT CG4 LACTIC ACID, ED - Abnormal; Notable for the following components:   Lactic Acid, Venous 2.98 (*)    All other components within normal limits  CULTURE, BLOOD (ROUTINE X 2)  CULTURE, BLOOD (ROUTINE X 2)  PROTIME-INR    EKG EKG Interpretation  Date/Time:  Thursday August 12 2018 12:19:18 EST Ventricular Rate:  120 PR Interval:    QRS Duration: 103 QT Interval:  339 QTC Calculation: 479 R Axis:   109 Text Interpretation:  Sinus tachycardia Ventricular premature complex LAE, consider biatrial enlargement Probable lateral infarct, age indeterminate Baseline wander in lead(s) II III aVF V2 V3 V5 Since last tracing rate faster Confirmed by Daleen Bo [phone]) on 08/12/2018 12:58:50 PM   Radiology Dg Chest 2 View  Result Date: 08/12/2018 CLINICAL DATA:  Shortness of breath. EXAM: CHEST - 2 VIEW COMPARISON:  Radiographs of October 26, 2015. FINDINGS: The heart size and mediastinal contours are within normal limits. Atherosclerosis of thoracic aorta is noted. No pneumothorax or pleural effusion is noted. Both lungs are clear. The visualized skeletal structures are unremarkable. IMPRESSION: No active cardiopulmonary disease. Aortic  Atherosclerosis (ICD10-I70.0). Electronically Signed   By: Marijo Conception, M.D.   On: 08/12/2018 12:38    Procedures .Critical Care Performed by: Daleen Bo, MD Authorized by: Daleen Bo, MD   Critical care provider statement:    Critical care time (minutes):  35   Critical care start time:  08/12/2018 12:25 PM    Critical care end time:  08/12/2018 2:19 PM   Critical care time was exclusive of:  Separately billable procedures and treating other patients   Critical care was necessary to treat or prevent imminent or life-threatening deterioration of the following conditions:  Respiratory failure   Critical care was time spent personally by me on the following activities:  Blood draw for specimens, development of treatment plan with patient or surrogate, discussions with consultants, evaluation of patient's response to treatment, examination of patient, obtaining history from patient or surrogate, ordering and performing treatments and interventions, ordering and review of laboratory studies, pulse oximetry, re-evaluation of patient's condition, review of old charts and ordering and review of radiographic studies   (including critical care time)  Medications Ordered in ED Medications  enoxaparin (LOVENOX) injection 40 mg (has no administration in time range)  doxycycline (VIBRA-TABS) tablet 100 mg (has no administration in time range)  methylPREDNISolone sodium succinate (SOLU-MEDROL) 40 mg/mL injection 40 mg (has no administration in time range)    Followed by  predniSONE (DELTASONE) tablet 40 mg (has no administration in time range)  umeclidinium bromide (INCRUSE ELLIPTA) 62.5 MCG/INH 1 puff (has no administration in time range)  ipratropium-albuterol (DUONEB) 0.5-2.5 (3) MG/3ML nebulizer solution 3 mL (has no administration in time range)  albuterol (PROVENTIL) (2.5 MG/3ML) 0.083% nebulizer solution 2.5 mg (has no administration in time range)  albuterol (PROVENTIL,VENTOLIN) solution continuous neb (10 mg/hr Nebulization Given 08/12/18 1253)  sodium chloride 0.9 % bolus 1,000 mL (1,000 mLs Intravenous New Bag/Given 08/12/18 1315)     Initial Impression / Assessment and Plan / ED Course  I have reviewed the triage vital signs and the nursing notes.  Pertinent labs & imaging results that were available  during my care of the patient were reviewed by me and considered in my medical decision making (see chart for details).  Clinical Course as of Aug 12 1508  Thu Aug 12, 2018  1414 Assessment by me now, patient pleated 1 hour neb, with oxygen.  Mask removed and O2 sat dropped to 82% with  4 minutes.  Facemask replaced, and oxygen sat improved to 90% quickly with deep breathing.  Transition to nasal cannula oxygen, will titrate for sats greater than 90%.  Patient agrees to admission.   [EW]  1415 Normal except sodium low, chloride low, glucose high, calcium low, total protein high  Comprehensive metabolic panel(!) [EW]  7209 Normal except white count high, adequate high  CBC with Differential(!) [EW]  1416 Elevated  I-Stat CG4 Lactic Acid, ED(!!) [EW]  1416 No infiltrate or CHF, images reviewed by me  DG Chest 2 View [EW]    Clinical Course User Index [EW] Daleen Bo, MD     Patient Vitals for the past 24 hrs:  BP Temp Temp src Pulse Resp SpO2 Height Weight  08/12/18 1256 - - - - - 100 % - -  08/12/18 1220 - - - - - - 5\' 4"  (1.626 m) 74.4 kg  08/12/18 1219 140/71 98.8 F (37.1 C) Oral (!) 121 (!) 26 100 % - -  08/12/18 1212 - - - - - 97 % - -  2:17 PM Reevaluation with update and discussion. After initial assessment and treatment, an updated evaluation reveals patient with respiratory distress after removal of oxygen, increased work of breathing, pursed lip breathing, and improvement when placed back on oxygen.  Agrees to hospitalization at this time.Daleen Bo   Medical Decision Making: COPD exacerbation, without pneumonia.  Doubt ACS, PE or sepsis.  CRITICAL CARE-yes Performed by: Daleen Bo  Nursing Notes Reviewed/ Care Coordinated Applicable Imaging Reviewed Interpretation of Laboratory Data incorporated into ED treatment   2:19 PM-Consult complete with hospitalist. Patient case explained and discussed.  He agrees to admit patient for further evaluation and  treatment. Call ended at 1440   Plan: Admit  Final Clinical Impressions(s) / ED Diagnoses   Final diagnoses:  COPD exacerbation [facility])  Hypoxia    ED Discharge Orders    None       Daleen Bo, MD 08/12/18 1511

## 2018-08-12 NOTE — ED Triage Notes (Addendum)
Per EMS: Pt is coming from a doctor's office with c/o shortness of breathe. Pt initially had wheezing in all fields and congestion. Pt was initially at 60% on RA when EMS arrived. PT has a hx of COPD, diabetes and hypertension. Pt began feeling sick last Friday and EMS was reportedly called out to the home last night but pt refused to go to the hospital. Pt does not use oxygen at home.   Pt was given 15 mg of albuterol, 1 mg of atrovent, 125 mg solumedrol, and 2g of magnesium

## 2018-08-12 NOTE — ED Notes (Signed)
I gave critical I Stat CG4  Results to MD Eulis Foster

## 2018-08-12 NOTE — Progress Notes (Signed)
[name] [name] stated that she will not take insulin , She wants to continue to take her metformin BID.

## 2018-08-13 DIAGNOSIS — J9601 Acute respiratory failure with hypoxia: Secondary | ICD-10-CM

## 2018-08-13 DIAGNOSIS — I10 Essential (primary) hypertension: Secondary | ICD-10-CM

## 2018-08-13 DIAGNOSIS — E871 Hypo-osmolality and hyponatremia: Secondary | ICD-10-CM

## 2018-08-13 DIAGNOSIS — E119 Type 2 diabetes mellitus without complications: Secondary | ICD-10-CM

## 2018-08-13 DIAGNOSIS — J441 Chronic obstructive pulmonary disease with (acute) exacerbation: Principal | ICD-10-CM

## 2018-08-13 LAB — CBC
HCT: 45.9 % (ref 36.0–46.0)
Hemoglobin: 14.2 g/dL (ref 12.0–15.0)
MCH: 28.7 pg (ref 26.0–34.0)
MCHC: 30.9 g/dL (ref 30.0–36.0)
MCV: 92.9 fL (ref 80.0–100.0)
NRBC: 0 % (ref 0.0–0.2)
Platelets: 246 10*3/uL (ref 150–400)
RBC: 4.94 MIL/uL (ref 3.87–5.11)
RDW: 17.2 % — ABNORMAL HIGH (ref 11.5–15.5)
WBC: 8.5 10*3/uL (ref 4.0–10.5)

## 2018-08-13 LAB — GLUCOSE, CAPILLARY
Glucose-Capillary: 137 mg/dL — ABNORMAL HIGH (ref 70–99)
Glucose-Capillary: 155 mg/dL — ABNORMAL HIGH (ref 70–99)
Glucose-Capillary: 127 mg/dL — ABNORMAL HIGH (ref 70–99)
Glucose-Capillary: 152 mg/dL — ABNORMAL HIGH (ref 70–99)
Glucose-Capillary: 96 mg/dL (ref 70–99)

## 2018-08-13 LAB — BASIC METABOLIC PANEL
Anion gap: 12 (ref 5–15)
BUN: 9 mg/dL (ref 8–23)
CO2: 25 mmol/L (ref 22–32)
Calcium: 8.4 mg/dL — ABNORMAL LOW (ref 8.9–10.3)
Chloride: 95 mmol/L — ABNORMAL LOW (ref 98–111)
Creatinine, Ser: 0.62 mg/dL (ref 0.44–1.00)
GFR calc Af Amer: >60 mL/min (ref >60)
Glucose, Bld: 161 mg/dL — ABNORMAL HIGH (ref 70–99)
Potassium: 4.6 mmol/L (ref 3.5–5.1)
Sodium: 132 mmol/L — ABNORMAL LOW (ref 135–145)

## 2018-08-13 LAB — LACTIC ACID, PLASMA: Lactic Acid, Venous: 2.4 mmol/L (ref 0.5–1.9)

## 2018-08-13 LAB — HIV ANTIBODY (ROUTINE TESTING W REFLEX): HIV Screen 4th Generation wRfx: NONREACTIVE

## 2018-08-13 MED ORDER — SALINE SPRAY 0.65 % NA SOLN
1.0000 | NASAL | Status: DC | PRN
  Administered 2018-08-13: 1 via NASAL
  Filled 2018-08-13: qty 44

## 2018-08-13 MED ORDER — POLYETHYLENE GLYCOL 3350 17 G PO PACK
17.0000 g | PACK | Freq: Two times a day (BID) | ORAL | Status: AC
  Administered 2018-08-13: 17 g via ORAL
  Filled 2018-08-13 (×3): qty 1

## 2018-08-13 MED ORDER — ALBUTEROL SULFATE (2.5 MG/3ML) 0.083% IN NEBU
2.5000 mg | INHALATION_SOLUTION | RESPIRATORY_TRACT | Status: DC
  Administered 2018-08-13 – 2018-08-14 (×3): 2.5 mg via RESPIRATORY_TRACT
  Filled 2018-08-13 (×3): qty 3

## 2018-08-13 MED ORDER — TIOTROPIUM BROMIDE MONOHYDRATE 18 MCG IN CAPS
18.0000 ug | ORAL_CAPSULE | Freq: Every day | RESPIRATORY_TRACT | Status: DC
  Filled 2018-08-13: qty 5

## 2018-08-13 MED ORDER — SODIUM CHLORIDE 0.9 % IV SOLN
INTRAVENOUS | Status: AC
  Administered 2018-08-13 (×2): via INTRAVENOUS

## 2018-08-13 MED ORDER — ALBUTEROL (5 MG/ML) CONTINUOUS INHALATION SOLN
2.5000 mg/h | INHALATION_SOLUTION | RESPIRATORY_TRACT | Status: DC
  Filled 2018-08-13: qty 20

## 2018-08-13 MED ORDER — LATANOPROST 0.005 % OP SOLN
1.0000 [drp] | Freq: Every day | OPHTHALMIC | Status: DC
  Administered 2018-08-13 – 2018-08-15 (×3): 1 [drp] via OPHTHALMIC
  Filled 2018-08-13: qty 2.5

## 2018-08-13 MED ORDER — SODIUM CHLORIDE 0.9 % IV BOLUS
1000.0000 mL | Freq: Once | INTRAVENOUS | Status: AC
  Administered 2018-08-13: 1000 mL via INTRAVENOUS

## 2018-08-13 NOTE — Evaluation (Signed)
Physical Therapy Evaluation Patient Details Name: [name] [name] MRN: [id number] DOB: 16-Jan-1951 Today's Date: 08/13/2018   History of Present Illness  68 yo female admitted to ED on 08/12/18 with acute hypoxic respiratory failure with COPD exacerbation, URI. Pt does not currently use home O2. PMH includes COPD, anxiety, OA, CAD, CKD, depression, DMII, diverticulosis of colon, GERD, HTN, vertigo, tobacco use, R RTC repair.   Clinical Impression   Pt presents with dyspnea on exertion, LE generalized weakness evidenced in bed mobility and ambulation, increased time and effort to perform mobility tasks, decreased endurance for activity, and O2 desaturations on 6LO2 during ambulation. Pt to benefit from acute PT to address deficits. Pt ambulated 2x55 ft with min guard assist to supervision for safety, requiring 1 seated rest break during session due to sats dropping <90% and dyspnea 3/4. HR during session WNL. PT recommending HHPT to address lack of endurance for ambulation, energy conservation techniques, breathing techniques, and weakness. PT to progress mobility as tolerated, and will continue to follow acutely.   SpO2 during session:  - 95% on 5LO2 at rest - 88% after bed mobility on 5LO2 - 88-90% on 6LO2 during ambulation, required 3 minute seated rest break to recover from desat and dyspnea      Follow Up Recommendations Home health PT    Equipment Recommendations  None recommended by PT    Recommendations for Other Services       Precautions / Restrictions Precautions Precautions: None Precaution Comments: Pt on 3-6LO2 via [place] during session, monitor sats via pulse ox  Restrictions Weight Bearing Restrictions: No      Mobility  Bed Mobility Overal bed mobility: Needs Assistance Bed Mobility: Supine to Sit     Supine to sit: Supervision;HOB elevated     General bed mobility comments: Pt on 5LO2 via [place] upon start of session, O2sats 94%. Supervision for safety. Pt with use  of bed rails and HOB elevation to sit EOB, increased time and effort. After pt got to EOB and applied pants, sats dropped as low as 87%. Pt instructed in pursed lip breathing, required 2 minute seated rest break.   Transfers Overall transfer level: Needs assistance   Transfers: Sit to/from Stand Sit to Stand: Supervision         General transfer comment: Supervision for safety. Increased time to come to standing. Pt placed on 6LO2 for ambulation, as O2 tank goes from 4 to 6L without option for 5L.   Ambulation/Gait Ambulation/Gait assistance: Min guard;Supervision;+2 safety/equipment(chair follow ) Gait Distance (Feet): 110 Feet(2x55 ft) Assistive device: None Gait Pattern/deviations: Step-through pattern;Decreased stride length;Shuffle Gait velocity: decr   General Gait Details: Pt with decreased step length and foot clearance with ambulation. Pt SpO2 ranging from 88-93% on 6LO2 via [place]. Pt with dyspnea 3/4 after 55 ft ambulation, required 3-minute seated rest break. Pt with use of accessory muscles for breathing when dyspneic.  PT instructed pt in pursed lip breathing to recover sats to >90% consistently.   Stairs            Wheelchair Mobility    Modified Rankin (Stroke Patients Only)       Balance Overall balance assessment: Mild deficits observed, not formally tested                                           Pertinent Vitals/Pain Pain Assessment: No/denies pain(Pt states  she experiences discomfort from shortness of breath )    Home Living Family/patient expects to be discharged to:: Private residence Living Arrangements: Spouse/significant other Available Help at Discharge: Family;Available PRN/intermittently Type of Home: House Home Access: Ramped entrance;Other (comment)(wheelchair lift, pt states no one uses wheelchair in her home but they have it just in case )     Home Layout: One level Home Equipment: Clinical cytogeneticist - 2  wheels;Wheelchair - manual      Prior Function Level of Independence: Independent         Comments: Pt reports she, if anything, assists her husband at home. Pt is independent with ADLs, pt reports that dyspnea on exertion is new.      Hand Dominance   Dominant Hand: Right    Extremity/Trunk Assessment   Upper Extremity Assessment Upper Extremity Assessment: Overall WFL for tasks assessed    Lower Extremity Assessment Lower Extremity Assessment: Generalized weakness    Cervical / Trunk Assessment Cervical / Trunk Assessment: Normal  Communication   Communication: No difficulties  Cognition Arousal/Alertness: Awake/alert Behavior During Therapy: WFL for tasks assessed/performed Overall Cognitive Status: Within Functional Limits for tasks assessed                                        General Comments      Exercises     Assessment/Plan    PT Assessment Patient needs continued PT services  PT Problem List Decreased strength;Cardiopulmonary status limiting activity;Decreased activity tolerance;Decreased mobility       PT Treatment Interventions DME instruction;Therapeutic activities;Gait training;Therapeutic exercise;Patient/family education;Balance training;Functional mobility training    PT Goals (Current goals can be found in the Care Plan section)  Acute Rehab PT Goals Patient Stated Goal: be able to breathe well with walking  PT Goal Formulation: With patient Time For Goal Achievement: 08/27/18 Potential to Achieve Goals: Good    Frequency Min 3X/week   Barriers to discharge        Co-evaluation               AM-PAC PT "6 Clicks" Mobility  Outcome Measure Help needed turning from your back to your side while in a flat bed without using bedrails?: A Little Help needed moving from lying on your back to sitting on the side of a flat bed without using bedrails?: A Little Help needed moving to and from a bed to a chair  (including a wheelchair)?: A Little Help needed standing up from a chair using your arms (e.g., wheelchair or bedside chair)?: None Help needed to walk in hospital room?: A Little Help needed climbing 3-5 steps with a railing? : A Little 6 Click Score: 19    End of Session Equipment Utilized During Treatment: Oxygen(5-6LO2 via [place] during session, pt placed on 6LO2 when PT left for pt to recover. RN notified. ) Activity Tolerance: Patient limited by fatigue;Other (comment)(pt limited by desaturations and fatigue) Patient left: in chair;with call bell/phone within reach;with family/visitor present Nurse Communication: Mobility status;Other (comment)(O2 requirements during ambulation ) PT Visit Diagnosis: Difficulty in walking, not elsewhere classified (R26.2)    Time: 9417-4081 PT Time Calculation (min) (ACUTE ONLY): 29 min   Charges:   PT Evaluation $PT Eval Low Complexity: 1 Low PT Treatments $Gait Training: 8-22 mins        Ricco Dershem Conception Chancy, PT Acute Rehabilitation Services Pager [phone]  Office [phone]   Emillio Ngo  D Kerwin Augustus 08/13/2018, 2:07 PM

## 2018-08-13 NOTE — Progress Notes (Signed)
[facility] PROGRESS NOTE    Progress Note  [name] [name]  UMP:[id number] DOB: Sep 21, 1950 DOA: 08/12/2018 PCP: Susy Frizzle, MD     Brief Narrative:   [name] [name] is an 68 y.o. female past medical history of diabetes mellitus type 2, tobacco abuse COPD not on oxygen presents with a one-week history of upper respiratory symptoms, with causing wheeze wheezing and hypoxia  Assessment/Plan:   Acute respiratory failure with hypoxia due to COPD exacerbation ([place]) With ongoing smoking. Agree with IV steroids antibiotics and inhalers. Lactic acid is due to hypoxia not due to sepsis. This patient does not have sepsis. He has never had PFTs will need to get them as an outpatient once stable. Consult physical therapy. She cannot speak in full sentences.   Hyponatremia: Due to hypovolemia, will start on aggressive IV fluid hydration.  Essential hypertension: Continue diltiazem and losartan.  CAD: Continue aspirin Lipitor.  Diabetes mellitus type 2 without complications: Patient refused sliding scale continue to hold metformin.  Lactic acidosis: Likely due to hypoxia she does not have sepsis.  Will not repeat lactic acid.  Chronic pain: Continue Norco at night.   DVT prophylaxis: lovenox Family Communication:son Disposition Plan/Barrier to D/C: once bretahing improved. Code Status:     Code Status Orders  (From admission, onward)         Start     Ordered   08/12/18 1506  Full code  Continuous     08/12/18 1509        Code Status History    Date Active Date Inactive Code Status Order ID Comments User Context   10/26/2015 1316 10/28/2015 1517 Full Code [id number]  Radene Gunning, NP ED        IV Access:    Peripheral IV   Procedures and diagnostic studies:   Dg Chest 2 View  Result Date: 08/12/2018 CLINICAL DATA:  Shortness of breath. EXAM: CHEST - 2 VIEW COMPARISON:  Radiographs of October 26, 2015. FINDINGS: The heart size and mediastinal  contours are within normal limits. Atherosclerosis of thoracic aorta is noted. No pneumothorax or pleural effusion is noted. Both lungs are clear. The visualized skeletal structures are unremarkable. IMPRESSION: No active cardiopulmonary disease. Aortic Atherosclerosis (ICD10-I70.0). Electronically Signed   By: Marijo Conception, M.D.   On: 08/12/2018 12:38     Medical Consultants:    None.  Anti-Infectives:   *Doxy  Subjective:    [name] [name] she relates she is still short of breath, slightly anorexic.  Objective:    Vitals:   08/12/18 2209 08/12/18 2224 08/13/18 0122 08/13/18 0608  BP: 126/74   118/63  Pulse: (!) 101   84  Resp: 19   18  Temp: 98.3 F (36.8 C)   98.3 F (36.8 C)  TempSrc: Oral   Oral  SpO2: 95% 92% 94% 93%  Weight:      Height:        Intake/Output Summary (Last 24 hours) at 08/13/2018 0808 Last data filed at 08/12/2018 1700 Gross per 24 hour  Intake 1239.99 ml  Output -  Net 1239.99 ml   Filed Weights   08/12/18 1220 08/12/18 1633  Weight: 74.4 kg 76.1 kg    Exam: General exam: In no acute distress.,  She cannot speak in full sentences. Respiratory system: Moderate air movement with wheezing bilaterally. Cardiovascular system: S1 & S2 heard, RRR.   Gastrointestinal system: Abdomen is nondistended, soft and nontender.  Central nervous system: Alert and oriented.  No focal neurological deficits. Extremities: No pedal edema. Skin: No rashes, lesions or ulcers Psychiatry: Judgement and insight appear normal. Mood & affect appropriate.    Data Reviewed:    Labs: Basic Metabolic Panel: Recent Labs  Lab 08/12/18 1227 08/13/18 0347  NA 132* 132*  K 4.0 4.6  CL 93* 95*  CO2 27 25  GLUCOSE 170* 161*  BUN 8 9  CREATININE 0.54 0.62  CALCIUM 8.6* 8.4*   GFR Estimated Creatinine Clearance: 66.3 mL/min (by C-G formula based on SCr of 0.62 mg/dL). Liver Function Tests: Recent Labs  Lab 08/12/18 1227  AST 26  ALT 21  ALKPHOS 50    BILITOT 0.2*  PROT 8.4*  ALBUMIN 4.0   No results for input(s): LIPASE, AMYLASE in the last 168 hours. No results for input(s): AMMONIA in the last 168 hours. Coagulation profile Recent Labs  Lab 08/12/18 1227  INR 0.90    CBC: Recent Labs  Lab 08/12/18 1227 08/13/18 0347  WBC 12.3* 8.5  NEUTROABS 10.1*  --   HGB 14.2 14.2  HCT 46.5* 45.9  MCV 92.8 92.9  PLT 248 246   Cardiac Enzymes: No results for input(s): CKTOTAL, CKMB, CKMBINDEX, TROPONINI in the last 168 hours. BNP (last 3 results) No results for input(s): PROBNP in the last 8760 hours. CBG: Recent Labs  Lab 08/12/18 1640  GLUCAP 187*   D-Dimer: No results for input(s): DDIMER in the last 72 hours. Hgb A1c: No results for input(s): HGBA1C in the last 72 hours. Lipid Profile: No results for input(s): CHOL, HDL, LDLCALC, TRIG, CHOLHDL, LDLDIRECT in the last 72 hours. Thyroid function studies: No results for input(s): TSH, T4TOTAL, T3FREE, THYROIDAB in the last 72 hours.  Invalid input(s): FREET3 Anemia work up: No results for input(s): VITAMINB12, FOLATE, FERRITIN, TIBC, IRON, RETICCTPCT in the last 72 hours. Sepsis Labs: Recent Labs  Lab 08/12/18 1227 08/12/18 1245 08/13/18 0347 08/13/18 0351  WBC 12.3*  --  8.5  --   LATICACIDVEN  --  2.98*  --  2.4*   Microbiology Recent Results (from the past 240 hour(s))  Culture, blood (Routine x 2)     Status: None (Preliminary result)   Collection Time: 08/12/18 12:28 PM  Result Value Ref Range Status   Specimen Description   Final    BLOOD BLOOD RIGHT FOREARM Performed at [facility], [place] [address] [place], [place] [address]    Special Requests   Final    BOTTLES DRAWN AEROBIC AND ANAEROBIC Blood Culture results may not be optimal due to an excessive volume of blood received in culture bottles Performed at [facility] [address], [place], [place] [address]    Culture   Final    NO GROWTH < 24  HOURS Performed at [facility] [address], [place], [place] [address]    Report Status PENDING  Incomplete  Culture, blood (Routine x 2)     Status: None (Preliminary result)   Collection Time: 08/12/18  1:20 PM  Result Value Ref Range Status   Specimen Description   Final    BLOOD BLOOD LEFT FOREARM Performed at [facility] [address], [place], [place] [address]    Special Requests   Final    BOTTLES DRAWN AEROBIC AND ANAEROBIC Blood Culture adequate volume Performed at [facility] [address], [place], [place] [address]    Culture   Final    NO GROWTH < 24 HOURS  Performed at [facility], [place] [address], [place], [place] [address]    Report Status PENDING  Incomplete     Medications:   . atorvastatin  20 mg Oral Daily  . diltiazem  120 mg Oral Daily  . doxycycline  100 mg Oral Q12H  . enoxaparin (LOVENOX) injection  40 mg Subcutaneous Q24H  . insulin aspart  0-15 Units Subcutaneous TID WC  . insulin aspart  0-5 Units Subcutaneous QHS  . ipratropium-albuterol  3 mL Nebulization Q6H  . losartan  50 mg Oral Daily  . methylPREDNISolone (SOLU-MEDROL) injection  40 mg Intravenous Q6H   Followed by  . [START ON 08/14/2018] predniSONE  40 mg Oral Q breakfast  . pantoprazole  40 mg Oral Daily  . rOPINIRole  2 mg Oral TID  . umeclidinium bromide  1 puff Inhalation Daily   Continuous Infusions:    LOS: 1 day   Charlynne Cousins  [facility]   *Please refer to [email or web address], password [email or web address] to get updated schedule on who will round on this patient, as hospitalists switch teams weekly. If 7PM-7AM, please contact night-coverage at [email or web address], password [email or web address] for any overnight needs.  08/13/2018, 8:08 AM

## 2018-08-14 LAB — GLUCOSE, CAPILLARY
GLUCOSE-CAPILLARY: 142 mg/dL — AB (ref 70–99)
Glucose-Capillary: 94 mg/dL (ref 70–99)
Glucose-Capillary: 116 mg/dL — ABNORMAL HIGH (ref 70–99)
Glucose-Capillary: 97 mg/dL (ref 70–99)

## 2018-08-14 MED ORDER — ZOLPIDEM TARTRATE 5 MG PO TABS
5.0000 mg | ORAL_TABLET | Freq: Every evening | ORAL | Status: DC | PRN
  Administered 2018-08-14 – 2018-08-15 (×2): 5 mg via ORAL
  Filled 2018-08-14 (×3): qty 1

## 2018-08-14 MED ORDER — ALBUTEROL SULFATE (2.5 MG/3ML) 0.083% IN NEBU
2.5000 mg | INHALATION_SOLUTION | Freq: Four times a day (QID) | RESPIRATORY_TRACT | Status: DC
  Administered 2018-08-14 – 2018-08-15 (×3): 2.5 mg via RESPIRATORY_TRACT
  Filled 2018-08-14 (×3): qty 3

## 2018-08-14 MED ORDER — OXYMETAZOLINE HCL 0.05 % NA SOLN
2.0000 | Freq: Two times a day (BID) | NASAL | Status: AC | PRN
  Administered 2018-08-14: 2 via NASAL
  Filled 2018-08-14: qty 15

## 2018-08-14 NOTE — Progress Notes (Signed)
[facility] PROGRESS NOTE    Progress Note  [name] [name]  ATF:[id number] DOB: 1951-07-16 DOA: 08/12/2018 PCP: Susy Frizzle, MD     Brief Narrative:   [name] [name] is an 68 y.o. female past medical history of diabetes mellitus type 2, tobacco abuse COPD not on oxygen presents with a one-week history of upper respiratory symptoms, with causing wheeze wheezing and hypoxia  Assessment/Plan:   Acute respiratory failure with hypoxia due to COPD exacerbation (HCC) Continue IV steroids, antibiotics and inhalers. He has never had PFTs will need to get them as an outpatient once stable. Consult physical therapy. He still cannot speak in full sentences is wheezing on physical exam. Eating 5 L of oxygen to keep saturations above 92%.  Hyponatremia: Due to hypovolemia, will start on aggressive IV fluid hydration.  Essential hypertension: Continue diltiazem and losartan.  CAD: Continue aspirin Lipitor.  Diabetes mellitus type 2 without complications: Control without insulin continue to hold metformin.  Lactic acidosis: Likely due to hypoxia she does not have sepsis.  Will not repeat lactic acid.  Chronic pain: Continue Norco at night.   DVT prophylaxis: lovenox Family Communication:son Disposition Plan/Barrier to D/C: once bretahing improved. Code Status:     Code Status Orders  (From admission, onward)         Start     Ordered   08/12/18 1506  Full code  Continuous     08/12/18 1509        Code Status History    Date Active Date Inactive Code Status Order ID Comments User Context   10/26/2015 1316 10/28/2015 1517 Full Code [id number]  Radene Gunning, NP ED        IV Access:    Peripheral IV   Procedures and diagnostic studies:   Dg Chest 2 View  Result Date: 08/12/2018 CLINICAL DATA:  Shortness of breath. EXAM: CHEST - 2 VIEW COMPARISON:  Radiographs of October 26, 2015. FINDINGS: The heart size and mediastinal contours are within normal  limits. Atherosclerosis of thoracic aorta is noted. No pneumothorax or pleural effusion is noted. Both lungs are clear. The visualized skeletal structures are unremarkable. IMPRESSION: No active cardiopulmonary disease. Aortic Atherosclerosis (ICD10-I70.0). Electronically Signed   By: Marijo Conception, M.D.   On: 08/12/2018 12:38     Medical Consultants:    None.  Anti-Infectives:   *Doxy  Subjective:    [name] [name] she is still short of breath, she relates her appetite is improving.  Objective:    Vitals:   08/13/18 2339 08/14/18 0300 08/14/18 0612 08/14/18 0732  BP:   140/71   Pulse:   80 80  Resp:   18 18  Temp:   98.4 F (36.9 C)   TempSrc:   Oral   SpO2: 92% 99% 98% 99%  Weight:      Height:       No intake or output data in the 24 hours ending 08/14/18 0828 Filed Weights   08/12/18 1220 08/12/18 1633  Weight: 74.4 kg 76.1 kg    Exam: General exam: In no acute distress.,  Still cannot speak in full sentences and purses her lips Respiratory system: Unchanged, moderate air movement with wheezing bilaterally. Cardiovascular system: S1 & S2 heard, RRR.   Gastrointestinal system: Abdomen is nondistended, soft and nontender.  Central nervous system: Alert and oriented. No focal neurological deficits. Extremities: No pedal edema. Skin: No rashes, lesions or ulcers Psychiatry: Judgement and insight appear normal. Mood & affect  appropriate.    Data Reviewed:    Labs: Basic Metabolic Panel: Recent Labs  Lab 08/12/18 1227 08/13/18 0347  NA 132* 132*  K 4.0 4.6  CL 93* 95*  CO2 27 25  GLUCOSE 170* 161*  BUN 8 9  CREATININE 0.54 0.62  CALCIUM 8.6* 8.4*   GFR Estimated Creatinine Clearance: 66.3 mL/min (by C-G formula based on SCr of 0.62 mg/dL). Liver Function Tests: Recent Labs  Lab 08/12/18 1227  AST 26  ALT 21  ALKPHOS 50  BILITOT 0.2*  PROT 8.4*  ALBUMIN 4.0   No results for input(s): LIPASE, AMYLASE in the last 168 hours. No results  for input(s): AMMONIA in the last 168 hours. Coagulation profile Recent Labs  Lab 08/12/18 1227  INR 0.90    CBC: Recent Labs  Lab 08/12/18 1227 08/13/18 0347  WBC 12.3* 8.5  NEUTROABS 10.1*  --   HGB 14.2 14.2  HCT 46.5* 45.9  MCV 92.8 92.9  PLT 248 246   Cardiac Enzymes: No results for input(s): CKTOTAL, CKMB, CKMBINDEX, TROPONINI in the last 168 hours. BNP (last 3 results) No results for input(s): PROBNP in the last 8760 hours. CBG: Recent Labs  Lab 08/13/18 0736 08/13/18 1147 08/13/18 1617 08/13/18 2106 08/14/18 0732  GLUCAP 155* 127* 152* 96 94   D-Dimer: No results for input(s): DDIMER in the last 72 hours. Hgb A1c: No results for input(s): HGBA1C in the last 72 hours. Lipid Profile: No results for input(s): CHOL, HDL, LDLCALC, TRIG, CHOLHDL, LDLDIRECT in the last 72 hours. Thyroid function studies: No results for input(s): TSH, T4TOTAL, T3FREE, THYROIDAB in the last 72 hours.  Invalid input(s): FREET3 Anemia work up: No results for input(s): VITAMINB12, FOLATE, FERRITIN, TIBC, IRON, RETICCTPCT in the last 72 hours. Sepsis Labs: Recent Labs  Lab 08/12/18 1227 08/12/18 1245 08/13/18 0347 08/13/18 0351  WBC 12.3*  --  8.5  --   LATICACIDVEN  --  2.98*  --  2.4*   Microbiology Recent Results (from the past 240 hour(s))  Culture, blood (Routine x 2)     Status: None (Preliminary result)   Collection Time: 08/12/18 12:28 PM  Result Value Ref Range Status   Specimen Description BLOOD BLOOD RIGHT FOREARM  Final   Special Requests   Final    BOTTLES DRAWN AEROBIC AND ANAEROBIC Blood Culture results may not be optimal due to an excessive volume of blood received in culture bottles Performed at [facility], [place] [address] [place], [place] [address]    Culture NO GROWTH 2 DAYS  Final   Report Status PENDING  Incomplete  Culture, blood (Routine x 2)     Status: None (Preliminary result)   Collection Time: 08/12/18  1:20 PM  Result  Value Ref Range Status   Specimen Description BLOOD BLOOD LEFT FOREARM  Final   Special Requests   Final    BOTTLES DRAWN AEROBIC AND ANAEROBIC Blood Culture adequate volume Performed at [facility] [address], [place], [place] [address]    Culture NO GROWTH 2 DAYS  Final   Report Status PENDING  Incomplete     Medications:   . albuterol  2.5 mg Nebulization Q6H WA  . atorvastatin  20 mg Oral Daily  . diltiazem  120 mg Oral Daily  . doxycycline  100 mg Oral Q12H  . enoxaparin (LOVENOX) injection  40 mg Subcutaneous Q24H  . insulin aspart  0-15 Units Subcutaneous TID WC  . insulin aspart  0-5  Units Subcutaneous QHS  . latanoprost  1 drop Both Eyes QHS  . losartan  50 mg Oral Daily  . pantoprazole  40 mg Oral Daily  . polyethylene glycol  17 g Oral BID  . predniSONE  40 mg Oral Q breakfast  . rOPINIRole  2 mg Oral TID  . umeclidinium bromide  1 puff Inhalation Daily   Continuous Infusions: . sodium chloride 75 mL/hr at 08/13/18 2141      LOS: 2 days   Charlynne Cousins  [facility]   *Please refer to [email or web address], password [email or web address] to get updated schedule on who will round on this patient, as hospitalists switch teams weekly. If 7PM-7AM, please contact night-coverage at [email or web address], password [email or web address] for any overnight needs.  08/14/2018, 8:28 AM

## 2018-08-15 LAB — GLUCOSE, CAPILLARY
Glucose-Capillary: 88 mg/dL (ref 70–99)
Glucose-Capillary: 118 mg/dL — ABNORMAL HIGH (ref 70–99)
Glucose-Capillary: 243 mg/dL — ABNORMAL HIGH (ref 70–99)
Glucose-Capillary: 95 mg/dL (ref 70–99)

## 2018-08-15 MED ORDER — PREDNISONE 50 MG PO TABS
50.0000 mg | ORAL_TABLET | Freq: Every day | ORAL | Status: DC
  Administered 2018-08-16: 50 mg via ORAL
  Filled 2018-08-15: qty 1

## 2018-08-15 MED ORDER — BENZONATATE 100 MG PO CAPS
200.0000 mg | ORAL_CAPSULE | Freq: Three times a day (TID) | ORAL | Status: DC | PRN
  Administered 2018-08-15: 200 mg via ORAL
  Filled 2018-08-15: qty 2

## 2018-08-15 MED ORDER — ALBUTEROL SULFATE (2.5 MG/3ML) 0.083% IN NEBU
2.5000 mg | INHALATION_SOLUTION | Freq: Three times a day (TID) | RESPIRATORY_TRACT | Status: DC
  Administered 2018-08-15 – 2018-08-16 (×3): 2.5 mg via RESPIRATORY_TRACT
  Filled 2018-08-15 (×4): qty 3

## 2018-08-15 NOTE — Progress Notes (Signed)
Heard pt's continuous pulse ox alarming. Pt had taken O2 off in her sleep, and O2 sat was reading as low as 63%. Replaced O2 at 4L and sat quickly recovered to 95%. Continue to monitor. Hortencia Conradi RN

## 2018-08-15 NOTE — Progress Notes (Signed)
[facility] PROGRESS NOTE    Progress Note  [name] [name]  IRS:[id number] DOB: [birth year] DOA: 08/12/2018 PCP: Susy Frizzle, MD     Brief Narrative:   [name] [name] is an 68 y.o. female past medical history of diabetes mellitus type 2, tobacco abuse COPD not on oxygen presents with a one-week history of upper respiratory symptoms, with causing wheeze wheezing and hypoxia  Assessment/Plan:   Acute respiratory failure with hypoxia due to COPD exacerbation (HCC) Change antibiotics steroids to oral. Continue inhalers. He has never had PFTs will need to get them as an outpatient once stable. Therapy recommended home health She got off her oxygen accidentally and her oxygen saturations were 60%. She will probably need 2 weeks of oxygen at home, will check ambulating check her saturations with ambulation.  Hyponatremia: Resolved with IV hydration.  Essential hypertension: Continue diltiazem and losartan.  CAD: Continue aspirin Lipitor.  Diabetes mellitus type 2 without complications: Continue sliding scale will resume metformin as an outpatient.  Lactic acidosis: Likely due to hypoxia she does not have sepsis.  Will not repeat lactic acid.  Chronic pain: Continue Norco at night.   DVT prophylaxis: lovenox Family Communication:son Disposition Plan/Barrier to D/C: Home in the morning. Code Status:     Code Status Orders  (From admission, onward)         Start     Ordered   08/12/18 1506  Full code  Continuous     08/12/18 1509        Code Status History    Date Active Date Inactive Code Status Order ID Comments User Context   10/26/2015 1316 10/28/2015 1517 Full Code [id number]  Radene Gunning, NP ED        IV Access:    Peripheral IV   Procedures and diagnostic studies:   No results found.   Medical Consultants:    None.  Anti-Infectives:   *Doxy  Subjective:    [name] [name] relates her shortness of breath is  improving.  Objective:    Vitals:   08/14/18 1939 08/14/18 2110 08/15/18 0633 08/15/18 0734  BP:  (!) 173/80 (!) 142/70   Pulse:  86 77 82  Resp:  14 12 18   Temp:  98.3 F (36.8 C) 97.9 F (36.6 C)   TempSrc:  Oral Oral   SpO2: 97% 97% 99% 96%  Weight:      Height:        Intake/Output Summary (Last 24 hours) at 08/15/2018 0933 Last data filed at 08/14/2018 1353 Gross per 24 hour  Intake 240 ml  Output 1000 ml  Net -760 ml   Filed Weights   08/12/18 1220 08/12/18 1633  Weight: 74.4 kg 76.1 kg    Exam: General exam: In no acute distress.,  Still cannot speak in full sentences and purses her lips Respiratory system: Continues to have moderate air movement and wheezing bilaterally, it seems to be unchanged from yesterday. Cardiovascular system: S1 & S2 heard, RRR.   Gastrointestinal system: Abdomen is nondistended, soft and nontender.  Central nervous system: Alert and oriented. No focal neurological deficits. Extremities: No pedal edema. Skin: No rashes, lesions or ulcers Psychiatry: Judgement and insight appear normal. Mood & affect appropriate.    Data Reviewed:    Labs: Basic Metabolic Panel: Recent Labs  Lab 08/12/18 1227 08/13/18 0347  NA 132* 132*  K 4.0 4.6  CL 93* 95*  CO2 27 25  GLUCOSE 170* 161*  BUN 8 9  CREATININE 0.54 0.62  CALCIUM 8.6* 8.4*   GFR Estimated Creatinine Clearance: 66.3 mL/min (by C-G formula based on SCr of 0.62 mg/dL). Liver Function Tests: Recent Labs  Lab 08/12/18 1227  AST 26  ALT 21  ALKPHOS 50  BILITOT 0.2*  PROT 8.4*  ALBUMIN 4.0   No results for input(s): LIPASE, AMYLASE in the last 168 hours. No results for input(s): AMMONIA in the last 168 hours. Coagulation profile Recent Labs  Lab 08/12/18 1227  INR 0.90    CBC: Recent Labs  Lab 08/12/18 1227 08/13/18 0347  WBC 12.3* 8.5  NEUTROABS 10.1*  --   HGB 14.2 14.2  HCT 46.5* 45.9  MCV 92.8 92.9  PLT 248 246   Cardiac Enzymes: No results for  input(s): CKTOTAL, CKMB, CKMBINDEX, TROPONINI in the last 168 hours. BNP (last 3 results) No results for input(s): PROBNP in the last 8760 hours. CBG: Recent Labs  Lab 08/14/18 0732 08/14/18 1155 08/14/18 1648 08/14/18 2116 08/15/18 0729  GLUCAP 94 116* 142* 97 88   D-Dimer: No results for input(s): DDIMER in the last 72 hours. Hgb A1c: No results for input(s): HGBA1C in the last 72 hours. Lipid Profile: No results for input(s): CHOL, HDL, LDLCALC, TRIG, CHOLHDL, LDLDIRECT in the last 72 hours. Thyroid function studies: No results for input(s): TSH, T4TOTAL, T3FREE, THYROIDAB in the last 72 hours.  Invalid input(s): FREET3 Anemia work up: No results for input(s): VITAMINB12, FOLATE, FERRITIN, TIBC, IRON, RETICCTPCT in the last 72 hours. Sepsis Labs: Recent Labs  Lab 08/12/18 1227 08/12/18 1245 08/13/18 0347 08/13/18 0351  WBC 12.3*  --  8.5  --   LATICACIDVEN  --  2.98*  --  2.4*   Microbiology Recent Results (from the past 240 hour(s))  Culture, blood (Routine x 2)     Status: None (Preliminary result)   Collection Time: 08/12/18 12:28 PM  Result Value Ref Range Status   Specimen Description BLOOD BLOOD RIGHT FOREARM  Final   Special Requests   Final    BOTTLES DRAWN AEROBIC AND ANAEROBIC Blood Culture results may not be optimal due to an excessive volume of blood received in culture bottles Performed at [facility], [place] [address], [place], [place] [address]    Culture NO GROWTH 3 DAYS  Final   Report Status PENDING  Incomplete  Culture, blood (Routine x 2)     Status: None (Preliminary result)   Collection Time: 08/12/18  1:20 PM  Result Value Ref Range Status   Specimen Description BLOOD BLOOD LEFT FOREARM  Final   Special Requests   Final    BOTTLES DRAWN AEROBIC AND ANAEROBIC Blood Culture adequate volume Performed at [facility] [address], [place], [place] [address]    Culture NO GROWTH 3 DAYS  Final   Report  Status PENDING  Incomplete     Medications:   . albuterol  2.5 mg Nebulization TID  . atorvastatin  20 mg Oral Daily  . diltiazem  120 mg Oral Daily  . doxycycline  100 mg Oral Q12H  . enoxaparin (LOVENOX) injection  40 mg Subcutaneous Q24H  . insulin aspart  0-15 Units Subcutaneous TID WC  . insulin aspart  0-5 Units Subcutaneous QHS  . latanoprost  1 drop Both Eyes QHS  . losartan  50 mg Oral Daily  . pantoprazole  40 mg Oral Daily  . polyethylene glycol  17 g Oral BID  . predniSONE  40 mg Oral Q breakfast  .  rOPINIRole  2 mg Oral TID  . umeclidinium bromide  1 puff Inhalation Daily   Continuous Infusions:     LOS: 3 days   Charlynne Cousins  [facility]   *Please refer to [email or web address], password [email or web address] to get updated schedule on who will round on this patient, as hospitalists switch teams weekly. If 7PM-7AM, please contact night-coverage at [email or web address], password [email or web address] for any overnight needs.  08/15/2018, 9:33 AM

## 2018-08-16 LAB — GLUCOSE, CAPILLARY
GLUCOSE-CAPILLARY: 81 mg/dL (ref 70–99)
Glucose-Capillary: 162 mg/dL — ABNORMAL HIGH (ref 70–99)

## 2018-08-16 MED ORDER — TIOTROPIUM BROMIDE MONOHYDRATE 18 MCG IN CAPS: 18.0000 ug | ORAL_CAPSULE | Freq: Every day | RESPIRATORY_TRACT | 2 refills | Status: DC

## 2018-08-16 MED ORDER — PREDNISONE 10 MG PO TABS: ORAL_TABLET | ORAL | 0 refills | Status: DC

## 2018-08-16 MED ORDER — DOXYCYCLINE HYCLATE 100 MG PO TABS: 100.0000 mg | ORAL_TABLET | Freq: Two times a day (BID) | ORAL | 0 refills | Status: AC

## 2018-08-16 NOTE — Discharge Summary (Signed)
Physician Discharge Summary  [name] [name] FTD:[id number] DOB: 1950-12-10 DOA: 08/12/2018  PCP: [name] Frizzle, MD  Admit date: 08/12/2018 Discharge date: 08/16/2018  Admitted From: home Disposition:  Home  Recommendations for Outpatient Follow-up:  1. Follow up with PCP in 1-2 weeks 2. Check saturations to see if she still requires oxygen supplementation.  Home Health:No Equipment/Devices:none  Discharge Condition:stable CODE STATUS:full Diet recommendation: Heart Healthy   Brief/Interim Summary: 68 y.o. female past medical history of diabetes mellitus type 2, tobacco abuse COPD not on oxygen presents with a one-week history of upper respiratory symptoms, with causing wheeze wheezing and hypoxia  Discharge Diagnoses:  Principal Problem:   COPD exacerbation ([place]) Active Problems:   TOBACCO ABUSE   Essential hypertension   Coronary atherosclerosis   Diabetes mellitus type 2, controlled, without complications (HCC)   Acute respiratory failure with hypoxia (HCC)   Diabetes ([place])   Hyponatremia Acute respiratory failure with hypoxia due to COPD exacerbation: She was started on IV steroids, antibiotics and inhalers. She is has never had PFTs as an outpatient. She was ambulated and her oxygen saturations dropped below 88% so she will go home on oxygen. It took her about 2 days for her breathing to improve. She will go home on steroids as a tapered, oral antibiotics and inhalers Spiriva and albuterol.  Hyponatremia: Likely hypovolemic resolved with IV hydration. Essential hypertension: No changes made to her medication.  CAD: Continue aspirin and Lipitor.  Diabetes mellitus type 2 without complications: Her metformin was held on admission she will resume as an outpatient.  Lactic acidosis: Likely due to hypoxia.  Chronic pain: Continue regimen at home no changes made to her medication.   Discharge Instructions  Discharge Instructions    Diet - low sodium heart  healthy   Complete by:  As directed    Increase activity slowly   Complete by:  As directed      Allergies as of 08/16/2018      Reactions   Guaifenesin & Derivatives    Severe Reaction   Benazepril Other (See Comments)   cough   Latex    REACTION: powder on gloves   Morphine    REACTION: nausea   Zyrtec [cetirizine] Other (See Comments)   swelling      Medication List    TAKE these medications   albuterol 108 (90 Base) MCG/ACT inhaler Commonly known as:  PROVENTIL HFA;VENTOLIN HFA Inhale 2 puffs into the lungs every 4 (four) hours as needed for wheezing or shortness of breath.   atorvastatin 20 MG tablet Commonly known as:  LIPITOR Take 1 tablet (20 mg total) by mouth daily.   bimatoprost 0.03 % ophthalmic solution Commonly known as:  LUMIGAN Place 1 drop into both eyes at bedtime.   cyclobenzaprine 10 MG tablet Commonly known as:  FLEXERIL Take 1 tablet (10 mg total) by mouth 3 (three) times daily as needed for muscle spasms. What changed:  when to take this   dexlansoprazole 60 MG capsule Commonly known as:  DEXILANT Take 1 capsule (60 mg total) by mouth daily.   diltiazem 120 MG tablet Commonly known as:  CARDIZEM Take 1 tablet (120 mg total) by mouth daily.   doxycycline 100 MG tablet Commonly known as:  VIBRA-TABS Take 1 tablet (100 mg total) by mouth every 12 (twelve) hours for 2 days.   glucose blood test strip Commonly known as:  FREESTYLE LITE Pt checks BS bid - also needs lancets disp: 1 box with prn refills  HYDROcodone-acetaminophen 10-325 MG tablet Commonly known as:  NORCO Take 1 tablet by mouth every 6 (six) hours as needed for moderate pain.   ipratropium-albuterol 0.5-2.5 (3) MG/3ML Soln Commonly known as:  DUONEB INHALE 1 VIAL VIA NEBULIZER EVERY 6 HOURS AS NEEDED What changed:    how much to take  how to take this  when to take this  reasons to take this   losartan 50 MG tablet Commonly known as:  COZAAR Take 1 tablet (50  mg total) by mouth daily.   metFORMIN 1000 MG tablet Commonly known as:  GLUCOPHAGE Take 1 tablet (1,000 mg total) by mouth 2 (two) times daily with a meal.   oxymorphone 20 MG 12 hr tablet Commonly known as:  OPANA ER Take 20 mg by mouth every 12 (twelve) hours as needed for pain.   predniSONE 10 MG tablet Commonly known as:  DELTASONE Takes 6 tablets for 1 days, then 5 tablets for 1 days, then 4 tablets for 1 days, then 3 tablets for 1 days, then 2 tabs for 1 days, then 1 tab for 1 days, and then stop.   rOPINIRole 2 MG tablet Commonly known as:  REQUIP TAKE 1 TABLET BY MOUTH THREE TIMES A DAY   tiotropium 18 MCG inhalation capsule Commonly known as:  SPIRIVA HANDIHALER Place 1 capsule (18 mcg total) into inhaler and inhale daily.   zolpidem 10 MG tablet Commonly known as:  AMBIEN Take 10 mg by mouth at bedtime as needed for sleep.   zolpidem 10 MG tablet Commonly known as:  AMBIEN Take 1 tablet (10 mg total) by mouth at bedtime as needed for sleep.            Durable Medical Equipment  (From admission, onward)         Start     Ordered   08/15/18 0940  For home use only DME oxygen  Once    Question Answer Comment  Mode or (Route) Nasal cannula   Liters per Minute 2   Frequency Continuous (stationary and portable oxygen unit needed)   Oxygen delivery system Gas      08/15/18 0939          Allergies  Allergen Reactions  . Guaifenesin & Derivatives     Severe Reaction  . Benazepril Other (See Comments)    cough  . Latex     REACTION: powder on gloves  . Morphine     REACTION: nausea  . Zyrtec [Cetirizine] Other (See Comments)    swelling    Consultations:  None   Procedures/Studies: Dg Chest 2 View  Result Date: 08/12/2018 CLINICAL DATA:  Shortness of breath. EXAM: CHEST - 2 VIEW COMPARISON:  Radiographs of October 26, 2015. FINDINGS: The heart size and mediastinal contours are within normal limits. Atherosclerosis of thoracic aorta is noted.  No pneumothorax or pleural effusion is noted. Both lungs are clear. The visualized skeletal structures are unremarkable. IMPRESSION: No active cardiopulmonary disease. Aortic Atherosclerosis (ICD10-I70.0). Electronically Signed   By: Marijo Conception, M.D.   On: 08/12/2018 12:38   None   Subjective: No complaints she feels great, she would like to go home.  Discharge Exam: Vitals:   08/16/18 0435 08/16/18 0842  BP: 132/82   Pulse: 92   Resp: 20   Temp: 97.9 F (36.6 C)   SpO2: 93% 93%   Vitals:   08/15/18 2017 08/15/18 2114 08/16/18 0435 08/16/18 0842  BP:  124/73 132/82   Pulse:  81 92   Resp:  16 20   Temp:  97.8 F (36.6 C) 97.9 F (36.6 C)   TempSrc:  Oral Oral   SpO2: 95% 92% 93% 93%  Weight:      Height:        General: Pt is alert, awake, not in acute distress Cardiovascular: RRR, S1/S2 +, no rubs, no gallops Respiratory: CTA bilaterally, no wheezing, no rhonchi Abdominal: Soft, NT, ND, bowel sounds + Extremities: no edema, no cyanosis    The results of significant diagnostics from this hospitalization (including imaging, microbiology, ancillary and laboratory) are listed below for reference.     Microbiology: Recent Results (from the past 240 hour(s))  Culture, blood (Routine x 2)     Status: None (Preliminary result)   Collection Time: 08/12/18 12:28 PM  Result Value Ref Range Status   Specimen Description BLOOD BLOOD RIGHT FOREARM  Final   Special Requests   Final    BOTTLES DRAWN AEROBIC AND ANAEROBIC Blood Culture results may not be optimal due to an excessive volume of blood received in culture bottles Performed at [facility] [address] [place], [place] [address]    Culture NO GROWTH 3 DAYS  Final   Report Status PENDING  Incomplete  Culture, blood (Routine x 2)     Status: None (Preliminary result)   Collection Time: 08/12/18  1:20 PM  Result Value Ref Range Status   Specimen Description BLOOD BLOOD LEFT FOREARM  Final    Special Requests   Final    BOTTLES DRAWN AEROBIC AND ANAEROBIC Blood Culture adequate volume Performed at [facility] [address], [place], [place] [address]    Culture NO GROWTH 3 DAYS  Final   Report Status PENDING  Incomplete     Labs: BNP (last 3 results) No results for input(s): BNP in the last 8760 hours. Basic Metabolic Panel: Recent Labs  Lab 08/12/18 1227 08/13/18 0347  NA 132* 132*  K 4.0 4.6  CL 93* 95*  CO2 27 25  GLUCOSE 170* 161*  BUN 8 9  CREATININE 0.54 0.62  CALCIUM 8.6* 8.4*   Liver Function Tests: Recent Labs  Lab 08/12/18 1227  AST 26  ALT 21  ALKPHOS 50  BILITOT 0.2*  PROT 8.4*  ALBUMIN 4.0   No results for input(s): LIPASE, AMYLASE in the last 168 hours. No results for input(s): AMMONIA in the last 168 hours. CBC: Recent Labs  Lab 08/12/18 1227 08/13/18 0347  WBC 12.3* 8.5  NEUTROABS 10.1*  --   HGB 14.2 14.2  HCT 46.5* 45.9  MCV 92.8 92.9  PLT 248 246   Cardiac Enzymes: No results for input(s): CKTOTAL, CKMB, CKMBINDEX, TROPONINI in the last 168 hours. BNP: Invalid input(s): POCBNP CBG: Recent Labs  Lab 08/15/18 0729 08/15/18 1141 08/15/18 1654 08/15/18 2112 08/16/18 0748  GLUCAP 88 118* 243* 95 81   D-Dimer No results for input(s): DDIMER in the last 72 hours. Hgb A1c No results for input(s): HGBA1C in the last 72 hours. Lipid Profile No results for input(s): CHOL, HDL, LDLCALC, TRIG, CHOLHDL, LDLDIRECT in the last 72 hours. Thyroid function studies No results for input(s): TSH, T4TOTAL, T3FREE, THYROIDAB in the last 72 hours.  Invalid input(s): FREET3 Anemia work up No results for input(s): VITAMINB12, FOLATE, FERRITIN, TIBC, IRON, RETICCTPCT in the last 72 hours. Urinalysis No results found for: COLORURINE, APPEARANCEUR, LABSPEC, PHURINE, GLUCOSEU, HGBUR, BILIRUBINUR, KETONESUR, PROTEINUR, UROBILINOGEN, NITRITE, LEUKOCYTESUR Sepsis Labs Invalid input(s): PROCALCITONIN,  WBC,   LACTICIDVEN Microbiology Recent Results (from the past 240 hour(s))  Culture, blood (Routine x 2)     Status: None (Preliminary result)   Collection Time: 08/12/18 12:28 PM  Result Value Ref Range Status   Specimen Description BLOOD BLOOD RIGHT FOREARM  Final   Special Requests   Final    BOTTLES DRAWN AEROBIC AND ANAEROBIC Blood Culture results may not be optimal due to an excessive volume of blood received in culture bottles Performed at [facility] [address], [place], [place] [address]    Culture NO GROWTH 3 DAYS  Final   Report Status PENDING  Incomplete  Culture, blood (Routine x 2)     Status: None (Preliminary result)   Collection Time: 08/12/18  1:20 PM  Result Value Ref Range Status   Specimen Description BLOOD BLOOD LEFT FOREARM  Final   Special Requests   Final    BOTTLES DRAWN AEROBIC AND ANAEROBIC Blood Culture adequate volume Performed at [facility] [address], [place], [place] [address]    Culture NO GROWTH 3 DAYS  Final   Report Status PENDING  Incomplete     Time coordinating discharge: 40 minutes  SIGNED:   Charlynne Cousins, MD  [facility] 08/16/2018, 8:55 AM Pager   If 7PM-7AM, please contact night-coverage [email or web address] Password [email or web address]

## 2018-08-16 NOTE — Progress Notes (Signed)
SATURATION QUALIFICATIONS: (This note is used to comply with regulatory documentation for home oxygen)  Patient Saturations on Room Air at Rest = 86%  Patient Saturations on Room Air while Ambulating = 82%  Patient Saturations on 2 Liters of oxygen while Ambulating = 92%  Please briefly explain why patient needs home oxygen: patient desaturates to 86% on room air at rest.

## 2018-08-16 NOTE — Progress Notes (Signed)
Physical Therapy Treatment Patient Details Name: [name] [name] MRN: [id number] DOB: [birth year] Today's Date: 08/16/2018    History of Present Illness 68 yo female admitted to ED on 08/12/18 with acute hypoxic respiratory failure with COPD exacerbation, URI. Pt does not currently use home O2. PMH includes COPD, anxiety, OA, CAD, CKD, depression, DMII, diverticulosis of colon, GERD, HTN, vertigo, tobacco use, R RTC repair.     PT Comments    Progressing with mobility. Pt continues to require O2. O2 sat readings during ambulation: 86% on RA, 91% on 2L [place]. Discussed d/c plan-pt will return home with family assisting as needed.    Follow Up Recommendations  No PT follow up; Intermittent Supervision/Assist     Equipment Recommendations  None recommended by PT    Recommendations for Other Services       Precautions / Restrictions Precautions Precautions: None Precaution Comments:  monitor sats  Restrictions Weight Bearing Restrictions: No    Mobility  Bed Mobility               General bed mobility comments: pt sitting EOB at start fo session  Transfers Overall transfer level: Modified independent               General transfer comment: Increased time.   Ambulation/Gait Ambulation/Gait assistance: Supervision Gait Distance (Feet): 100 Feet Assistive device: None Gait Pattern/deviations: Step-through pattern;Decreased stride length     General Gait Details: Slow but steady. Dyspnea 2/4. O2 sat 86% on RA, 91% on 2L [place]. One brief standing rest break after ~40 feet. No LOB.   Stairs             Wheelchair Mobility    Modified Rankin (Stroke Patients Only)       Balance Overall balance assessment: Mild deficits observed, not formally tested                                          Cognition Arousal/Alertness: Awake/alert Behavior During Therapy: WFL for tasks assessed/performed Overall Cognitive Status: Within Functional Limits  for tasks assessed                                        Exercises      General Comments        Pertinent Vitals/Pain Pain Assessment: No/denies pain    Home Living                      Prior Function            PT Goals (current goals can now be found in the care plan section) Progress towards PT goals: Progressing toward goals    Frequency    Min 3X/week      PT Plan Current plan remains appropriate    Co-evaluation              AM-PAC PT "6 Clicks" Mobility   Outcome Measure  Help needed turning from your back to your side while in a flat bed without using bedrails?: None Help needed moving from lying on your back to sitting on the side of a flat bed without using bedrails?: None Help needed moving to and from a bed to a chair (including a wheelchair)?: None Help needed standing up from a chair using  your arms (e.g., wheelchair or bedside chair)?: None Help needed to walk in hospital room?: A Little Help needed climbing 3-5 steps with a railing? : A Little 6 Click Score: 22    End of Session Equipment Utilized During Treatment: Oxygen Activity Tolerance: Patient limited by fatigue Patient left: in bed;with call bell/phone within reach;with bed alarm set   PT Visit Diagnosis: Difficulty in walking, not elsewhere classified (R26.2)     Time: 0254-2706 PT Time Calculation (min) (ACUTE ONLY): 14 min  Charges:  $Gait Training: 8-22 mins                        Weston Anna, [place] Pager: [phone] Office: [phone]

## 2018-08-16 NOTE — Care Management Important Message (Signed)
Important Message  Patient Details  Name: [name] [name] MRN: [id number] Date of Birth: 06-02-1951   Medicare Important Message Given:  Yes    Kerin Salen 08/16/2018, 11:13 AMImportant Message  Patient Details  Name: [name] [name] MRN: [id number] Date of Birth: Mar 24, 1951   Medicare Important Message Given:  Yes    Kerin Salen 08/16/2018, 11:13 AM

## 2018-08-16 NOTE — Progress Notes (Signed)
Patient discharged to home, all discharge medications and instructions reviewed and questions answered.   

## 2018-08-17 LAB — CULTURE, BLOOD (ROUTINE X 2)
Culture: NO GROWTH
Culture: NO GROWTH
Special Requests: ADEQUATE

## 2018-08-19 ENCOUNTER — Other Ambulatory Visit: Payer: Self-pay | Admitting: *Deleted

## 2018-08-19 MED ORDER — TIOTROPIUM BROMIDE MONOHYDRATE 18 MCG IN CAPS: 18.0000 ug | ORAL_CAPSULE | Freq: Every day | RESPIRATORY_TRACT | 3 refills | Status: DC

## 2018-08-27 DIAGNOSIS — Z79891 Long term (current) use of opiate analgesic: Secondary | ICD-10-CM | POA: Diagnosis not present

## 2018-08-27 DIAGNOSIS — G894 Chronic pain syndrome: Secondary | ICD-10-CM | POA: Diagnosis not present

## 2018-08-31 ENCOUNTER — Encounter: Payer: Self-pay | Admitting: Family Medicine

## 2018-08-31 ENCOUNTER — Ambulatory Visit (INDEPENDENT_AMBULATORY_CARE_PROVIDER_SITE_OTHER): Payer: Medicare Other | Admitting: Family Medicine

## 2018-08-31 ENCOUNTER — Ambulatory Visit
Admission: RE | Admit: 2018-08-31 | Discharge: 2018-08-31 | Disposition: A | Discharge status: Home / Self Care | Payer: Medicare Other | Source: Ambulatory Visit | Attending: Family Medicine | Admitting: Family Medicine

## 2018-08-31 VITALS — BP 120/60 | HR 95 | Temp 98.0°F | Resp 18 | Ht 64.0 in | Wt 166.0 lb

## 2018-08-31 DIAGNOSIS — J441 Chronic obstructive pulmonary disease with (acute) exacerbation: Secondary | ICD-10-CM | POA: Diagnosis not present

## 2018-08-31 DIAGNOSIS — J449 Chronic obstructive pulmonary disease, unspecified: Secondary | ICD-10-CM | POA: Diagnosis not present

## 2018-08-31 DIAGNOSIS — R0602 Shortness of breath: Secondary | ICD-10-CM | POA: Diagnosis not present

## 2018-08-31 LAB — BASIC METABOLIC PANEL WITH GFR
BUN: 8 mg/dL (ref 7–25)
CO2: 29 mmol/L (ref 20–32)
Calcium: 8.8 mg/dL (ref 8.6–10.4)
Chloride: 98 mmol/L (ref 98–110)
Creat: 0.72 mg/dL (ref 0.50–0.99)
GFR, EST NON AFRICAN AMERICAN: 85 mL/min/{1.73_m2} (ref >=60)
GFR, Est African American: 99 mL/min/{1.73_m2} (ref >=60)
Glucose, Bld: 122 mg/dL — ABNORMAL HIGH (ref 65–99)
Potassium: 4.4 mmol/L (ref 3.5–5.3)
Sodium: 136 mmol/L (ref 135–146)

## 2018-08-31 LAB — CBC WITH DIFFERENTIAL/PLATELET
Absolute Monocytes: 730 cells/uL (ref 200–950)
Basophils Absolute: 58 cells/uL (ref 0–200)
Basophils Relative: 0.7 %
Eosinophils Absolute: 340 cells/uL (ref 15–500)
Eosinophils Relative: 4.1 %
HCT: 37.6 % (ref 35.0–45.0)
Hemoglobin: 13 g/dL (ref 11.7–15.5)
Lymphs Abs: 2382 cells/uL (ref 850–3900)
MCH: 29.7 pg (ref 27.0–33.0)
MCHC: 34.6 g/dL (ref 32.0–36.0)
MCV: 86 fL (ref 80.0–100.0)
MPV: 9.3 fL (ref 7.5–12.5)
Monocytes Relative: 8.8 %
Neutro Abs: 4789 cells/uL (ref 1500–7800)
Neutrophils Relative %: 57.7 %
Platelets: 392 10*3/uL (ref 140–400)
RBC: 4.37 10*6/uL (ref 3.80–5.10)
RDW: 15 % (ref 11.0–15.0)
TOTAL LYMPHOCYTE: 28.7 %
WBC: 8.3 10*3/uL (ref 3.8–10.8)

## 2018-08-31 MED ORDER — TIOTROPIUM BROMIDE MONOHYDRATE 18 MCG IN CAPS: 18.0000 ug | ORAL_CAPSULE | Freq: Every day | RESPIRATORY_TRACT | 3 refills | Status: DC

## 2018-08-31 NOTE — Progress Notes (Signed)
Subjective:    Patient ID: [name] [name], female    DOB: 01/10/1951, 68 y.o.   MRN: [id number]  HPI Please see my last office visit.  Patient was admitted to the hospital with a severe COPD exacerbation.  I have copied relevant portions of the discharge summary below and included them for my reference:  Admit date: 08/12/2018 Discharge date: 08/16/2018  Admitted From: home Disposition:  Home  Recommendations for Outpatient Follow-up:  1. Follow up with PCP in 1-2 weeks 2. Check saturations to see if she still requires oxygen supplementation.  Home Health:No Equipment/Devices:none  Discharge Condition:stable CODE STATUS:full Diet recommendation: Heart Healthy   Brief/Interim Summary: 68 y.o.femalepast medical history of diabetes mellitus type 2, tobacco abuse COPD not on oxygen presents with a one-week history of upper respiratory symptoms, with causing wheeze wheezing and hypoxia  Discharge Diagnoses:  Principal Problem:   COPD exacerbation ([place]) Active Problems:   TOBACCO ABUSE   Essential hypertension   Coronary atherosclerosis   Diabetes mellitus type 2, controlled, without complications (HCC)   Acute respiratory failure with hypoxia (HCC)   Diabetes ([place])   Hyponatremia Acute respiratory failure with hypoxia due to COPD exacerbation: She was started on IV steroids, antibiotics and inhalers. She is has never had PFTs as an outpatient. She was ambulated and her oxygen saturations dropped below 88% so she will go home on oxygen. It took her about 2 days for her breathing to improve. She will go home on steroids as a tapered, oral antibiotics and inhalers Spiriva and albuterol.  Hyponatremia: Likely hypovolemic resolved with IV hydration. Essential hypertension: No changes made to her medication.  CAD: Continue aspirin and Lipitor.  Diabetes mellitus type 2 without complications: Her metformin was held on admission she will resume as an  outpatient.  Lactic acidosis: Likely due to hypoxia.  Chronic pain: Continue regimen at home no changes made to her medication.   Discharge Instructions      Discharge Instructions    Diet - low sodium heart healthy   Complete by:  As directed    Increase activity slowly   Complete by:  As directed      Patient is here today for follow-up.  She states that her breathing is much better.  She has discontinued smoking.  I am extremely proud of the patient for this.  However she never started the Spiriva as prescribed in the hospital.  At the present time she is using albuterol nebulizer treatments 1-2 times a day and then using the rescue inhaler 1-2 times a day depending on if she is home or not.  She is also still on oxygen.  She still requires oxygen as she quickly desats with ambulation off oxygen.  She denies any chest pain although she continues to have a cough.  Cough is nonproductive.  On pulmonary exam today, her breath sounds have improved dramatically.  There is no wheezing whatsoever however she has pronounced bibasilar crackles.  There is no pitting edema in her extremities.  She has no JVD.  She denies any chest pain.  She denies any orthopnea.  We have tried the patient on Symbicort in the past for COPD however she discontinue the medication due to trouble with her vision.  She has a history of glaucoma and therefore we must be careful about exacerbating her intraocular pressure. Past Medical History:  Diagnosis Date  . Anxiety   . Arthritis   . CAD (coronary artery disease)   . Chronic kidney  disease    Renal insufficiency  . Depression   . Diabetes mellitus   . Diverticulosis of colon (without mention of hemorrhage)   . Duodenitis without mention of hemorrhage   . GERD (gastroesophageal reflux disease)   . Hemorrhoids   . Hypercholesteremia   . Hypertension   . Insomnia   . Tinnitus   . Vertigo    Past Surgical History:  Procedure Laterality Date  .  ANGIOPLASTY    . CHOLECYSTECTOMY    . ROTATOR CUFF REPAIR Right 04/2012  . TUBAL LIGATION     Current Outpatient Medications on File Prior to Visit  Medication Sig Dispense Refill  . albuterol (PROVENTIL HFA;VENTOLIN HFA) 108 (90 Base) MCG/ACT inhaler Inhale 2 puffs into the lungs every 4 (four) hours as needed for wheezing or shortness of breath. 3 Inhaler 1  . atorvastatin (LIPITOR) 20 MG tablet Take 1 tablet (20 mg total) by mouth daily. 90 tablet 3  . bimatoprost (LUMIGAN) 0.03 % ophthalmic solution Place 1 drop into both eyes at bedtime.     . cyclobenzaprine (FLEXERIL) 10 MG tablet Take 1 tablet (10 mg total) by mouth 3 (three) times daily as needed for muscle spasms. (Patient taking differently: Take 10 mg by mouth 3 (three) times daily. ) 90 tablet 2  . dexlansoprazole (DEXILANT) 60 MG capsule Take 1 capsule (60 mg total) by mouth daily. 90 capsule 3  . diltiazem (CARDIZEM) 120 MG tablet Take 1 tablet (120 mg total) by mouth daily. 90 tablet 3  . glucose blood (FREESTYLE LITE) test strip Pt checks BS bid - also needs lancets disp: 1 box with prn refills 100 each 11  . HYDROcodone-acetaminophen (NORCO) 10-325 MG per tablet Take 1 tablet by mouth every 6 (six) hours as needed for moderate pain.     Marland Kitchen ipratropium-albuterol (DUONEB) 0.5-2.5 (3) MG/3ML SOLN INHALE 1 VIAL VIA NEBULIZER EVERY 6 HOURS AS NEEDED (Patient taking differently: Inhale 3 mLs into the lungs every 6 (six) hours as needed (sob and wheezing). INHALE 1 VIAL VIA NEBULIZER EVERY 6 HOURS AS NEEDED) 360 mL 0  . losartan (COZAAR) 50 MG tablet Take 1 tablet (50 mg total) by mouth daily. 90 tablet 3  . metFORMIN (GLUCOPHAGE) 1000 MG tablet Take 1 tablet (1,000 mg total) by mouth 2 (two) times daily with a meal. 180 tablet 3  . OXYGEN Inhale 2 L into the lungs continuous.    Marland Kitchen oxymorphone (OPANA ER) 20 MG 12 hr tablet Take 20 mg by mouth every 12 (twelve) hours as needed for pain.     Marland Kitchen rOPINIRole (REQUIP) 2 MG tablet TAKE 1 TABLET  BY MOUTH THREE TIMES A DAY 90 tablet 5  . zolpidem (AMBIEN) 10 MG tablet Take 10 mg by mouth at bedtime as needed for sleep.     No current facility-administered medications on file prior to visit.    Allergies  Allergen Reactions  . Guaifenesin & Derivatives     Severe Reaction  . Benazepril Other (See Comments)    cough  . Latex     REACTION: powder on gloves  . Morphine     REACTION: nausea  . Zyrtec [Cetirizine] Other (See Comments)    swelling   Social History   Socioeconomic History  . Marital status: Married    Spouse name: Not on file  . Number of children: 1  . Years of education: Not on file  . Highest education level: Not on file  Occupational History  . Occupation:  Retired  Scientific laboratory technician  . Financial resource strain: Not on file  . Food insecurity:    Worry: Not on file    Inability: Not on file  . Transportation needs:    Medical: Not on file    Non-medical: Not on file  Tobacco Use  . Smoking status: Former Smoker    Types: Cigarettes    Last attempt to quit: 08/12/2018    Years since quitting: 0.0  . Smokeless tobacco: Never Used  Substance and Sexual Activity  . Alcohol use: No    Alcohol/week: 0.0 standard drinks  . Drug use: No  . Sexual activity: Not on file  Lifestyle  . Physical activity:    Days per week: Not on file    Minutes per session: Not on file  . Stress: Not on file  Relationships  . Social connections:    Talks on phone: Not on file    Gets together: Not on file    Attends religious service: Not on file    Active member of club or organization: Not on file    Attends meetings of clubs or organizations: Not on file    Relationship status: Not on file  . Intimate partner violence:    Fear of current or ex partner: Not on file    Emotionally abused: Not on file    Physically abused: Not on file    Forced sexual activity: Not on file  Other Topics Concern  . Not on file  Social History Narrative  . Not on file        Review of Systems  All other systems reviewed and are negative.      Objective:   Physical Exam Vitals signs reviewed.  Constitutional:      General: She is not in acute distress.    Appearance: Normal appearance. She is obese. She is not ill-appearing or diaphoretic.  HENT:     Nose: No congestion or rhinorrhea.  Neck:     Vascular: No carotid bruit.  Cardiovascular:     Rate and Rhythm: Normal rate and regular rhythm.     Heart sounds: Normal heart sounds. No murmur.  Pulmonary:     Effort: Pulmonary effort is normal. No respiratory distress.     Breath sounds: No stridor. Rales present. No wheezing or rhonchi.  Chest:     Chest wall: No tenderness.  Abdominal:     General: Bowel sounds are normal. There is no distension.     Palpations: Abdomen is soft.     Tenderness: There is no abdominal tenderness. There is no guarding.  Musculoskeletal:     Right lower leg: No edema.     Left lower leg: No edema.  Lymphadenopathy:     Cervical: No cervical adenopathy.  Neurological:     Mental Status: She is alert.           Assessment & Plan:  COPD exacerbation ([place]) - Plan: DG Chest 2 View, CBC with Differential/Platelet, Brain natriuretic peptide, BASIC METABOLIC PANEL WITH GFR  Patient is here today for hospital discharge follow-up.  Her COPD exacerbation has improved dramatically.  There is no wheezing.  However I am concerned that she is not on maintenance medication.  I applauded the patient on her smoking cessation.  Given her glaucoma I would prefer Symbicort over Spiriva however I do not have any samples of Symbicort.  I do have samples of Spiriva.  The patient is unable to afford either prescription right  now and is waiting on the [place] to approve her medication.  Therefore I will start the patient on samples of Spiriva 2.5 mcg, 2 inhalations once a day.  I gave her samples to last 1 month.  I would like to see the patient back in 1 week and at that time recheck  her oxygen saturations.  I will also check a chest x-ray given the bibasilar crackles to rule out pulmonary edema or early infiltrate.  The crackles could also possibly due to atelectasis.  Reassess in 1 week

## 2018-09-01 ENCOUNTER — Ambulatory Visit: Payer: Medicare Other | Admitting: Vascular Surgery

## 2018-09-01 LAB — BRAIN NATRIURETIC PEPTIDE: Brain Natriuretic Peptide: 55 pg/mL (ref <100)

## 2018-09-07 ENCOUNTER — Other Ambulatory Visit: Payer: Medicare Other

## 2018-09-07 ENCOUNTER — Ambulatory Visit (INDEPENDENT_AMBULATORY_CARE_PROVIDER_SITE_OTHER): Payer: Medicare Other | Admitting: Family Medicine

## 2018-09-07 ENCOUNTER — Encounter: Payer: Self-pay | Admitting: Family Medicine

## 2018-09-07 VITALS — BP 170/70 | HR 106 | Temp 97.8°F | Resp 18 | Ht 64.0 in | Wt 170.0 lb

## 2018-09-07 DIAGNOSIS — E119 Type 2 diabetes mellitus without complications: Secondary | ICD-10-CM | POA: Diagnosis not present

## 2018-09-07 DIAGNOSIS — I10 Essential (primary) hypertension: Secondary | ICD-10-CM | POA: Diagnosis not present

## 2018-09-07 DIAGNOSIS — J441 Chronic obstructive pulmonary disease with (acute) exacerbation: Secondary | ICD-10-CM | POA: Diagnosis not present

## 2018-09-07 DIAGNOSIS — J449 Chronic obstructive pulmonary disease, unspecified: Secondary | ICD-10-CM | POA: Diagnosis not present

## 2018-09-07 NOTE — Progress Notes (Signed)
Subjective:    Patient ID: [name] [name], female    DOB: [birth year], 68 y.o.   MRN: [id number]  HPI Please see my last office visit.  Patient was admitted to the hospital with a severe COPD exacerbation.  I have copied relevant portions of the discharge summary below and included them for my reference:  Admit date: 08/12/2018 Discharge date: 08/16/2018  Admitted From: home Disposition:  Home  Recommendations for Outpatient Follow-up:  1. Follow up with PCP in 1-2 weeks 2. Check saturations to see if she still requires oxygen supplementation.  Home Health:No Equipment/Devices:none  Discharge Condition:stable CODE STATUS:full Diet recommendation: Heart Healthy   Brief/Interim Summary: 68 y.o.femalepast medical history of diabetes mellitus type 2, tobacco abuse COPD not on oxygen presents with a one-week history of upper respiratory symptoms, with causing wheeze wheezing and hypoxia  Discharge Diagnoses:  Principal Problem:   COPD exacerbation ([place]) Active Problems:   TOBACCO ABUSE   Essential hypertension   Coronary atherosclerosis   Diabetes mellitus type 2, controlled, without complications (HCC)   Acute respiratory failure with hypoxia (HCC)   Diabetes ([place])   Hyponatremia Acute respiratory failure with hypoxia due to COPD exacerbation: She was started on IV steroids, antibiotics and inhalers. She is has never had PFTs as an outpatient. She was ambulated and her oxygen saturations dropped below 88% so she will go home on oxygen. It took her about 2 days for her breathing to improve. She will go home on steroids as a tapered, oral antibiotics and inhalers Spiriva and albuterol.  Hyponatremia: Likely hypovolemic resolved with IV hydration. Essential hypertension: No changes made to her medication.  CAD: Continue aspirin and Lipitor.  Diabetes mellitus type 2 without complications: Her metformin was held on admission she will resume as an  outpatient.  Lactic acidosis: Likely due to hypoxia.  Chronic pain: Continue regimen at home no changes made to her medication.   Discharge Instructions      Discharge Instructions    Diet - low sodium heart healthy   Complete by:  As directed    Increase activity slowly   Complete by:  As directed      Patient is here today for follow-up.  She states that her breathing is much better.  She has discontinued smoking.  I am extremely proud of the patient for this.  However she never started the Spiriva as prescribed in the hospital.  At the present time she is using albuterol nebulizer treatments 1-2 times a day and then using the rescue inhaler 1-2 times a day depending on if she is home or not.  She is also still on oxygen.  She still requires oxygen as she quickly desats with ambulation off oxygen.  She denies any chest pain although she continues to have a cough.  Cough is nonproductive.  On pulmonary exam today, her breath sounds have improved dramatically.  There is no wheezing whatsoever however she has pronounced bibasilar crackles.  There is no pitting edema in her extremities.  She has no JVD.  She denies any chest pain.  She denies any orthopnea.  We have tried the patient on Symbicort in the past for COPD however she discontinue the medication due to trouble with her vision.  She has a history of glaucoma and therefore we must be careful about exacerbating her intraocular pressure.  At that time, my plan was: Patient is here today for hospital discharge follow-up.  Her COPD exacerbation has improved dramatically.  There is  no wheezing.  However I am concerned that she is not on maintenance medication.  I applauded the patient on her smoking cessation.  Given her glaucoma I would prefer Symbicort over Spiriva however I do not have any samples of Symbicort.  I do have samples of Spiriva.  The patient is unable to afford either prescription right now and is waiting on the [place] to  approve her medication.  Therefore I will start the patient on samples of Spiriva 2.5 mcg, 2 inhalations once a day.  I gave her samples to last 1 month.  I would like to see the patient back in 1 week and at that time recheck her oxygen saturations.  I will also check a chest x-ray given the bibasilar crackles to rule out pulmonary edema or early infiltrate.  The crackles could also possibly due to atelectasis.  Reassess in 1 week  09/07/18 Patient is currently taking Spiriva.  She states that her breathing has improved.  She is 96% on room air today.  With ambulation she drops to 92% after walking 120 feet.  However she drops to 89% after making the third lap around the clinic.  Therefore she would like to continue the oxygen at least for an additional month.  She is still using it with ambulation.  Her blood pressure however is elevated today at 170/70.  She denies any chest pain or shortness of breath.  She is taking her losartan Past Medical History:  Diagnosis Date  . Anxiety   . Arthritis   . CAD (coronary artery disease)   . Chronic kidney disease    Renal insufficiency  . Depression   . Diabetes mellitus   . Diverticulosis of colon (without mention of hemorrhage)   . Duodenitis without mention of hemorrhage   . GERD (gastroesophageal reflux disease)   . Hemorrhoids   . Hypercholesteremia   . Hypertension   . Insomnia   . Tinnitus   . Vertigo    Past Surgical History:  Procedure Laterality Date  . ANGIOPLASTY    . CHOLECYSTECTOMY    . ROTATOR CUFF REPAIR Right 04/2012  . TUBAL LIGATION     Current Outpatient Medications on File Prior to Visit  Medication Sig Dispense Refill  . albuterol (PROVENTIL HFA;VENTOLIN HFA) 108 (90 Base) MCG/ACT inhaler Inhale 2 puffs into the lungs every 4 (four) hours as needed for wheezing or shortness of breath. 3 Inhaler 1  . atorvastatin (LIPITOR) 20 MG tablet Take 1 tablet (20 mg total) by mouth daily. 90 tablet 3  . bimatoprost (LUMIGAN) 0.03  % ophthalmic solution Place 1 drop into both eyes at bedtime.     . cyclobenzaprine (FLEXERIL) 10 MG tablet Take 1 tablet (10 mg total) by mouth 3 (three) times daily as needed for muscle spasms. (Patient taking differently: Take 10 mg by mouth 3 (three) times daily. ) 90 tablet 2  . dexlansoprazole (DEXILANT) 60 MG capsule Take 1 capsule (60 mg total) by mouth daily. 90 capsule 3  . diltiazem (CARDIZEM) 120 MG tablet Take 1 tablet (120 mg total) by mouth daily. 90 tablet 3  . glucose blood (FREESTYLE LITE) test strip Pt checks BS bid - also needs lancets disp: 1 box with prn refills 100 each 11  . HYDROcodone-acetaminophen (NORCO) 10-325 MG per tablet Take 1 tablet by mouth every 6 (six) hours as needed for moderate pain.     Marland Kitchen ipratropium-albuterol (DUONEB) 0.5-2.5 (3) MG/3ML SOLN INHALE 1 VIAL VIA NEBULIZER EVERY 6 HOURS  AS NEEDED (Patient taking differently: Inhale 3 mLs into the lungs every 6 (six) hours as needed (sob and wheezing). INHALE 1 VIAL VIA NEBULIZER EVERY 6 HOURS AS NEEDED) 360 mL 0  . losartan (COZAAR) 50 MG tablet Take 1 tablet (50 mg total) by mouth daily. 90 tablet 3  . metFORMIN (GLUCOPHAGE) 1000 MG tablet Take 1 tablet (1,000 mg total) by mouth 2 (two) times daily with a meal. 180 tablet 3  . OXYGEN Inhale 2 L into the lungs continuous.    Marland Kitchen oxymorphone (OPANA ER) 20 MG 12 hr tablet Take 20 mg by mouth every 12 (twelve) hours as needed for pain.     Marland Kitchen rOPINIRole (REQUIP) 2 MG tablet TAKE 1 TABLET BY MOUTH THREE TIMES A DAY 90 tablet 5  . tiotropium (SPIRIVA HANDIHALER) 18 MCG inhalation capsule Place 1 capsule (18 mcg total) into inhaler and inhale daily. 90 capsule 3  . zolpidem (AMBIEN) 10 MG tablet Take 10 mg by mouth at bedtime as needed for sleep.     No current facility-administered medications on file prior to visit.    Allergies  Allergen Reactions  . Guaifenesin & Derivatives     Severe Reaction  . Benazepril Other (See Comments)    cough  . Latex     REACTION:  powder on gloves  . Morphine     REACTION: nausea  . Zyrtec [Cetirizine] Other (See Comments)    swelling   Social History   Socioeconomic History  . Marital status: Married    Spouse name: Not on file  . Number of children: 1  . Years of education: Not on file  . Highest education level: Not on file  Occupational History  . Occupation: Retired  Scientific laboratory technician  . Financial resource strain: Not on file  . Food insecurity:    Worry: Not on file    Inability: Not on file  . Transportation needs:    Medical: Not on file    Non-medical: Not on file  Tobacco Use  . Smoking status: Former Smoker    Types: Cigarettes    Last attempt to quit: 08/12/2018    Years since quitting: 0.0  . Smokeless tobacco: Never Used  Substance and Sexual Activity  . Alcohol use: No    Alcohol/week: 0.0 standard drinks  . Drug use: No  . Sexual activity: Not on file  Lifestyle  . Physical activity:    Days per week: Not on file    Minutes per session: Not on file  . Stress: Not on file  Relationships  . Social connections:    Talks on phone: Not on file    Gets together: Not on file    Attends religious service: Not on file    Active member of club or organization: Not on file    Attends meetings of clubs or organizations: Not on file    Relationship status: Not on file  . Intimate partner violence:    Fear of current or ex partner: Not on file    Emotionally abused: Not on file    Physically abused: Not on file    Forced sexual activity: Not on file  Other Topics Concern  . Not on file  Social History Narrative  . Not on file       Review of Systems  All other systems reviewed and are negative.      Objective:   Physical Exam Vitals signs reviewed.  Constitutional:  General: She is not in acute distress.    Appearance: Normal appearance. She is obese. She is not ill-appearing or diaphoretic.  HENT:     Nose: No congestion or rhinorrhea.  Neck:     Vascular: No carotid  bruit.  Cardiovascular:     Rate and Rhythm: Normal rate and regular rhythm.     Heart sounds: Normal heart sounds. No murmur.  Pulmonary:     Effort: Pulmonary effort is normal. No respiratory distress.     Breath sounds: No stridor. Rales present. No wheezing or rhonchi.  Chest:     Chest wall: No tenderness.  Abdominal:     General: Bowel sounds are normal. There is no distension.     Palpations: Abdomen is soft.     Tenderness: There is no abdominal tenderness. There is no guarding.  Musculoskeletal:     Right lower leg: No edema.     Left lower leg: No edema.  Lymphadenopathy:     Cervical: No cervical adenopathy.  Neurological:     Mental Status: She is alert.           Assessment & Plan:  COPD exacerbation.  COPD exacerbation has resolved gradually.  She is improving.  She is almost to the point where she can discontinue oxygen.  Continue Spiriva and reassess oxygen in 1 month.  At that time I feel we can discontinue oxygen altogether.  Blood pressure is elevated.  Therefore I would increase losartan to 100 mg a day and recheck blood pressure in 2 weeks.  Have asked the patient to return fasting due to her diabetes and we will check a CBC, CMP, fasting lipid panel, and hemoglobin A1c

## 2018-09-08 ENCOUNTER — Other Ambulatory Visit: Payer: Medicare Other

## 2018-09-08 DIAGNOSIS — E119 Type 2 diabetes mellitus without complications: Secondary | ICD-10-CM | POA: Diagnosis not present

## 2018-09-09 LAB — COMPLETE METABOLIC PANEL WITH GFR
AG Ratio: 1.1 (calc) (ref 1.0–2.5)
ALKALINE PHOSPHATASE (APISO): 42 U/L (ref 33–130)
ALT: 15 U/L (ref 6–29)
AST: 16 U/L (ref 10–35)
Albumin: 3.5 g/dL — ABNORMAL LOW (ref 3.6–5.1)
BUN: 7 mg/dL (ref 7–25)
CO2: 29 mmol/L (ref 20–32)
Calcium: 9.3 mg/dL (ref 8.6–10.4)
Chloride: 99 mmol/L (ref 98–110)
Creat: 0.67 mg/dL (ref 0.50–0.99)
GFR, Est African American: 104 mL/min/{1.73_m2} (ref >=60)
GFR, Est Non African American: 90 mL/min/{1.73_m2} (ref >=60)
Globulin: 3.1 g/dL (calc) (ref 1.9–3.7)
Glucose, Bld: 121 mg/dL — ABNORMAL HIGH (ref 65–99)
Potassium: 4.3 mmol/L (ref 3.5–5.3)
Sodium: 139 mmol/L (ref 135–146)
Total Bilirubin: 0.3 mg/dL (ref 0.2–1.2)
Total Protein: 6.6 g/dL (ref 6.1–8.1)

## 2018-09-09 LAB — HEMOGLOBIN A1C
Hgb A1c MFr Bld: 6.8 % of total Hgb — ABNORMAL HIGH (ref <5.7)
Mean Plasma Glucose: 148 (calc)
eAG (mmol/L): 8.2 (calc)

## 2018-09-09 LAB — CBC WITH DIFFERENTIAL/PLATELET
Absolute Monocytes: 631 cells/uL (ref 200–950)
Basophils Absolute: 62 cells/uL (ref 0–200)
Basophils Relative: 0.8 %
Eosinophils Absolute: 485 cells/uL (ref 15–500)
Eosinophils Relative: 6.3 %
HCT: 39.3 % (ref 35.0–45.0)
Hemoglobin: 13.5 g/dL (ref 11.7–15.5)
Lymphs Abs: 3311 cells/uL (ref 850–3900)
MCH: 29.9 pg (ref 27.0–33.0)
MCHC: 34.4 g/dL (ref 32.0–36.0)
MCV: 86.9 fL (ref 80.0–100.0)
MPV: 9.1 fL (ref 7.5–12.5)
Monocytes Relative: 8.2 %
NEUTROS PCT: 41.7 %
Neutro Abs: 3211 cells/uL (ref 1500–7800)
Platelets: 384 10*3/uL (ref 140–400)
RBC: 4.52 10*6/uL (ref 3.80–5.10)
RDW: 15 % (ref 11.0–15.0)
Total Lymphocyte: 43 %
WBC: 7.7 10*3/uL (ref 3.8–10.8)

## 2018-09-09 LAB — LIPID PANEL
CHOL/HDL RATIO: 2.5 (calc) (ref <5.0)
CHOLESTEROL: 120 mg/dL (ref <200)
HDL: 48 mg/dL — AB (ref >50)
LDL Cholesterol (Calc): 58 mg/dL (calc)
NON-HDL CHOLESTEROL (CALC): 72 mg/dL (ref <130)
Triglycerides: 63 mg/dL (ref <150)

## 2018-09-09 LAB — MICROALBUMIN, URINE: Microalb, Ur: 0.4 mg/dL

## 2018-09-15 ENCOUNTER — Telehealth: Payer: Self-pay | Admitting: Family Medicine

## 2018-09-15 DIAGNOSIS — H40053 Ocular hypertension, bilateral: Secondary | ICD-10-CM | POA: Diagnosis not present

## 2018-09-15 MED ORDER — LOSARTAN POTASSIUM 100 MG PO TABS: 100.0000 mg | ORAL_TABLET | Freq: Every day | ORAL | 3 refills | Status: DC

## 2018-09-15 NOTE — Telephone Encounter (Signed)
BP At home is coming down with the increase of Losartan. 172/69,152/64,162/66,139/66,131/61,157/60,149/57.

## 2018-09-16 NOTE — Telephone Encounter (Signed)
Still too high. I would add hctz 12.5 mg a day and recheck bp and labs in 2 weeks.

## 2018-09-17 MED ORDER — HYDROCHLOROTHIAZIDE 12.5 MG PO CAPS: 12.5000 mg | ORAL_CAPSULE | Freq: Every day | ORAL | 3 refills | Status: DC

## 2018-09-17 NOTE — Telephone Encounter (Signed)
Pt aware of recommendations, apt scheduled and med sent to pharm

## 2018-10-01 ENCOUNTER — Ambulatory Visit: Payer: Medicare Other | Admitting: Family Medicine

## 2018-10-05 ENCOUNTER — Encounter: Payer: Self-pay | Admitting: Family Medicine

## 2018-10-05 ENCOUNTER — Ambulatory Visit (INDEPENDENT_AMBULATORY_CARE_PROVIDER_SITE_OTHER): Payer: Medicare Other | Admitting: Family Medicine

## 2018-10-05 VITALS — BP 126/64 | HR 99 | Temp 98.0°F | Resp 18 | Ht 64.0 in | Wt 178.0 lb

## 2018-10-05 DIAGNOSIS — E119 Type 2 diabetes mellitus without complications: Secondary | ICD-10-CM | POA: Diagnosis not present

## 2018-10-05 DIAGNOSIS — E7800 Pure hypercholesterolemia, unspecified: Secondary | ICD-10-CM

## 2018-10-05 DIAGNOSIS — I10 Essential (primary) hypertension: Secondary | ICD-10-CM | POA: Diagnosis not present

## 2018-10-05 DIAGNOSIS — J449 Chronic obstructive pulmonary disease, unspecified: Secondary | ICD-10-CM | POA: Diagnosis not present

## 2018-10-05 DIAGNOSIS — R002 Palpitations: Secondary | ICD-10-CM

## 2018-10-05 NOTE — Progress Notes (Signed)
Subjective:    Patient ID: [name] [name], female    DOB: August 30, 1950, 68 y.o.   MRN: [id number]  HPI Please see my last office visit.  Patient was admitted to the hospital with a severe COPD exacerbation.  I have copied relevant portions of the discharge summary below and included them for my reference:  Admit date: 08/12/2018 Discharge date: 08/16/2018  Admitted From: home Disposition:  Home  Recommendations for Outpatient Follow-up:  1. Follow up with PCP in 1-2 weeks 2. Check saturations to see if she still requires oxygen supplementation.  Home Health:No Equipment/Devices:none  Discharge Condition:stable CODE STATUS:full Diet recommendation: Heart Healthy   Brief/Interim Summary: 68 y.o.femalepast medical history of diabetes mellitus type 2, tobacco abuse COPD not on oxygen presents with a one-week history of upper respiratory symptoms, with causing wheeze wheezing and hypoxia  Discharge Diagnoses:  Principal Problem:   COPD exacerbation ([place]) Active Problems:   TOBACCO ABUSE   Essential hypertension   Coronary atherosclerosis   Diabetes mellitus type 2, controlled, without complications (HCC)   Acute respiratory failure with hypoxia (HCC)   Diabetes ([place])   Hyponatremia Acute respiratory failure with hypoxia due to COPD exacerbation: She was started on IV steroids, antibiotics and inhalers. She is has never had PFTs as an outpatient. She was ambulated and her oxygen saturations dropped below 88% so she will go home on oxygen. It took her about 2 days for her breathing to improve. She will go home on steroids as a tapered, oral antibiotics and inhalers Spiriva and albuterol.  Hyponatremia: Likely hypovolemic resolved with IV hydration. Essential hypertension: No changes made to her medication.  CAD: Continue aspirin and Lipitor.  Diabetes mellitus type 2 without complications: Her metformin was held on admission she will resume as an  outpatient.  Lactic acidosis: Likely due to hypoxia.  Chronic pain: Continue regimen at home no changes made to her medication.   Discharge Instructions      Discharge Instructions    Diet - low sodium heart healthy   Complete by:  As directed    Increase activity slowly   Complete by:  As directed      Patient is here today for follow-up.  She states that her breathing is much better.  She has discontinued smoking.  I am extremely proud of the patient for this.  However she never started the Spiriva as prescribed in the hospital.  At the present time she is using albuterol nebulizer treatments 1-2 times a day and then using the rescue inhaler 1-2 times a day depending on if she is home or not.  She is also still on oxygen.  She still requires oxygen as she quickly desats with ambulation off oxygen.  She denies any chest pain although she continues to have a cough.  Cough is nonproductive.  On pulmonary exam today, her breath sounds have improved dramatically.  There is no wheezing whatsoever however she has pronounced bibasilar crackles.  There is no pitting edema in her extremities.  She has no JVD.  She denies any chest pain.  She denies any orthopnea.  We have tried the patient on Symbicort in the past for COPD however she discontinue the medication due to trouble with her vision.  She has a history of glaucoma and therefore we must be careful about exacerbating her intraocular pressure.  At that time, my plan was: Patient is here today for hospital discharge follow-up.  Her COPD exacerbation has improved dramatically.  There is  no wheezing.  However I am concerned that she is not on maintenance medication.  I applauded the patient on her smoking cessation.  Given her glaucoma I would prefer Symbicort over Spiriva however I do not have any samples of Symbicort.  I do have samples of Spiriva.  The patient is unable to afford either prescription right now and is waiting on the [place] to  approve her medication.  Therefore I will start the patient on samples of Spiriva 2.5 mcg, 2 inhalations once a day.  I gave her samples to last 1 month.  I would like to see the patient back in 1 week and at that time recheck her oxygen saturations.  I will also check a chest x-ray given the bibasilar crackles to rule out pulmonary edema or early infiltrate.  The crackles could also possibly due to atelectasis.  Reassess in 1 week  09/07/18 Patient is currently taking Spiriva.  She states that her breathing has improved.  She is 96% on room air today.  With ambulation she drops to 92% after walking 120 feet.  However she drops to 89% after making the third lap around the clinic.  Therefore she would like to continue the oxygen at least for an additional month.  She is still using it with ambulation.  Her blood pressure however is elevated today at 170/70.  She denies any chest pain or shortness of breath.  She is taking her losartan.  At that time, my plan was: COPD exacerbation has resolved gradually.  She is improving.  She is almost to the point where she can discontinue oxygen.  Continue Spiriva and reassess oxygen in 1 month.  At that time I feel we can discontinue oxygen altogether.  Blood pressure is elevated.  Therefore I would increase losartan to 100 mg a day and recheck blood pressure in 2 weeks.  Have asked the patient to return fasting due to her diabetes and we will check a CBC, CMP, fasting lipid panel, and hemoglobin A1c  10/05/18 Patient called back February 7 and her blood pressure was still too high.  Therefore I recommended that she add hydrochlorothiazide 12.5 mg p.o. daily to the losartan and recheck blood pressure today.  Her last labs were acceptable: Office Visit on 09/07/2018  Component Date Value Ref Range Status  . Hgb A1c MFr Bld 09/08/2018 6.8* <5.7 % of total Hgb Final   Comment: For someone without known diabetes, a hemoglobin A1c value of 6.5% or greater indicates that  they may have  diabetes and this should be confirmed with a follow-up  test. . For someone with known diabetes, a value <7% indicates  that their diabetes is well controlled and a value  greater than or equal to 7% indicates suboptimal  control. A1c targets should be individualized based on  duration of diabetes, age, comorbid conditions, and  other considerations. . Currently, no consensus exists regarding use of hemoglobin A1c for diagnosis of diabetes for children. .   . Mean Plasma Glucose 09/08/2018 148  (calc) Final  . eAG (mmol/L) 09/08/2018 8.2  (calc) Final  . WBC 09/08/2018 7.7  3.8 - 10.8 Thousand/uL Final  . RBC 09/08/2018 4.52  3.80 - 5.10 Million/uL Final  . Hemoglobin 09/08/2018 13.5  11.7 - 15.5 g/dL Final  . HCT 09/08/2018 39.3  35.0 - 45.0 % Final  . MCV 09/08/2018 86.9  80.0 - 100.0 fL Final  . MCH 09/08/2018 29.9  27.0 - 33.0 pg Final  . MCHC  09/08/2018 34.4  32.0 - 36.0 g/dL Final  . RDW 09/08/2018 15.0  11.0 - 15.0 % Final  . Platelets 09/08/2018 384  140 - 400 Thousand/uL Final  . MPV 09/08/2018 9.1  7.5 - 12.5 fL Final  . Neutro Abs 09/08/2018 3,211  1,500 - 7,800 cells/uL Final  . Lymphs Abs 09/08/2018 3,311  850 - 3,900 cells/uL Final  . Absolute Monocytes 09/08/2018 631  200 - 950 cells/uL Final  . Eosinophils Absolute 09/08/2018 485  15 - 500 cells/uL Final  . Basophils Absolute 09/08/2018 62  0 - 200 cells/uL Final  . Neutrophils Relative % 09/08/2018 41.7  % Final  . Total Lymphocyte 09/08/2018 43.0  % Final  . Monocytes Relative 09/08/2018 8.2  % Final  . Eosinophils Relative 09/08/2018 6.3  % Final  . Basophils Relative 09/08/2018 0.8  % Final  . Glucose, Bld 09/08/2018 121* 65 - 99 mg/dL Final   Comment: .            Fasting reference interval . For someone without known diabetes, a glucose value between 100 and 125 mg/dL is consistent with prediabetes and should be confirmed with a follow-up test. .   . BUN 09/08/2018 7  7 - 25 mg/dL  Final  . Creat 09/08/2018 0.67  0.50 - 0.99 mg/dL Final   Comment: For patients >43 years of age, the reference limit for Creatinine is approximately 13% higher for people identified as African-American. .   . GFR, Est Non African American 09/08/2018 90  > OR = 60 mL/min/1.3m2 Final  . GFR, Est African American 09/08/2018 104  > OR = 60 mL/min/1.82m2 Final  . BUN/Creatinine Ratio 32/67/1245 NOT APPLICABLE  6 - 22 (calc) Final  . Sodium 09/08/2018 139  135 - 146 mmol/L Final  . Potassium 09/08/2018 4.3  3.5 - 5.3 mmol/L Final  . Chloride 09/08/2018 99  98 - 110 mmol/L Final  . CO2 09/08/2018 29  20 - 32 mmol/L Final  . Calcium 09/08/2018 9.3  8.6 - 10.4 mg/dL Final  . Total Protein 09/08/2018 6.6  6.1 - 8.1 g/dL Final  . Albumin 09/08/2018 3.5* 3.6 - 5.1 g/dL Final  . Globulin 09/08/2018 3.1  1.9 - 3.7 g/dL (calc) Final  . AG Ratio 09/08/2018 1.1  1.0 - 2.5 (calc) Final  . Total Bilirubin 09/08/2018 0.3  0.2 - 1.2 mg/dL Final  . Alkaline phosphatase (APISO) 09/08/2018 42  33 - 130 U/L Final  . AST 09/08/2018 16  10 - 35 U/L Final  . ALT 09/08/2018 15  6 - 29 U/L Final  . Cholesterol 09/08/2018 120  <200 mg/dL Final  . HDL 09/08/2018 48* >50 mg/dL Final  . Triglycerides 09/08/2018 63  <150 mg/dL Final  . LDL Cholesterol (Calc) 09/08/2018 58  mg/dL (calc) Final   Comment: Reference range: <100 . Desirable range <100 mg/dL for primary prevention;   <70 mg/dL for patients with CHD or diabetic patients  with > or = 2 CHD risk factors. Marland Kitchen LDL-C is now calculated using the Martin-Hopkins  calculation, which is a validated novel method providing  better accuracy than the Friedewald equation in the  estimation of LDL-C.  Cresenciano Genre et al. Annamaria Helling. 8099;833(82): 2061-2068  (http://education.[employer].com/faq/FAQ164)   . Total CHOL/HDL Ratio 09/08/2018 2.5  <5.0 (calc) Final  . Non-HDL Cholesterol (Calc) 09/08/2018 72  <130 mg/dL (calc) Final   Comment: For patients with diabetes  plus 1 major ASCVD risk  factor, treating to a non-HDL-C goal of <  100 mg/dL  (LDL-C of <70 mg/dL) is considered a therapeutic  option.   Jacquelyne Balint, Ur 09/08/2018 0.4  mg/dL Final   Comment: Reference Range Not established   . RAM 09/08/2018    Final   Comment: . The ADA defines abnormalities in albumin excretion as follows: Marland Kitchen Category         Result (mcg/mg creatinine) . Normal                    <30 Microalbuminuria         30-299  Clinical albuminuria   > OR = 300 . The ADA recommends that at least two of three specimens collected within a 3-6 month period be abnormal before considering a patient to be within a diagnostic category.    Patient returns today to recheck her blood pressure.  Her blood pressure was 126/64 and is excellent.  However on her vital signs, she was noted to have an irregularly irregular heart rhythm.  It was difficult to determine if this was A. fib or PVCs based on auscultation and therefore I performed an EKG.  Patient does report feeling that she can feel her heart skip occasionally.  She denies any chest pain shortness of breath syncope or near syncope.  Past Medical History:  Diagnosis Date  . Anxiety   . Arthritis   . CAD (coronary artery disease)   . Chronic kidney disease    Renal insufficiency  . COPD (chronic obstructive pulmonary disease) ([place])   . Depression   . Diabetes mellitus   . Diverticulosis of colon (without mention of hemorrhage)   . Duodenitis without mention of hemorrhage   . GERD (gastroesophageal reflux disease)   . Hemorrhoids   . Hypercholesteremia   . Hypertension   . Insomnia   . Tinnitus   . Vertigo    Past Surgical History:  Procedure Laterality Date  . ANGIOPLASTY    . CHOLECYSTECTOMY    . ROTATOR CUFF REPAIR Right 04/2012  . TUBAL LIGATION     Current Outpatient Medications on File Prior to Visit  Medication Sig Dispense Refill  . albuterol (PROVENTIL HFA;VENTOLIN HFA) 108 (90 Base) MCG/ACT inhaler  Inhale 2 puffs into the lungs every 4 (four) hours as needed for wheezing or shortness of breath. 3 Inhaler 1  . atorvastatin (LIPITOR) 20 MG tablet Take 1 tablet (20 mg total) by mouth daily. 90 tablet 3  . bimatoprost (LUMIGAN) 0.03 % ophthalmic solution Place 1 drop into both eyes at bedtime.     . cyclobenzaprine (FLEXERIL) 10 MG tablet Take 1 tablet (10 mg total) by mouth 3 (three) times daily as needed for muscle spasms. (Patient taking differently: Take 10 mg by mouth 3 (three) times daily. ) 90 tablet 2  . dexlansoprazole (DEXILANT) 60 MG capsule Take 1 capsule (60 mg total) by mouth daily. 90 capsule 3  . diltiazem (CARDIZEM) 120 MG tablet Take 1 tablet (120 mg total) by mouth daily. 90 tablet 3  . glucose blood (FREESTYLE LITE) test strip Pt checks BS bid - also needs lancets disp: 1 box with prn refills 100 each 11  . hydrochlorothiazide (MICROZIDE) 12.5 MG capsule Take 1 capsule (12.5 mg total) by mouth daily. 90 capsule 3  . HYDROcodone-acetaminophen (NORCO) 10-325 MG per tablet Take 1 tablet by mouth every 6 (six) hours as needed for moderate pain.     Marland Kitchen ipratropium-albuterol (DUONEB) 0.5-2.5 (3) MG/3ML SOLN INHALE 1 VIAL VIA NEBULIZER EVERY 6 HOURS AS  NEEDED (Patient taking differently: Inhale 3 mLs into the lungs every 6 (six) hours as needed (sob and wheezing). INHALE 1 VIAL VIA NEBULIZER EVERY 6 HOURS AS NEEDED) 360 mL 0  . losartan (COZAAR) 100 MG tablet Take 1 tablet (100 mg total) by mouth daily. 90 tablet 3  . metFORMIN (GLUCOPHAGE) 1000 MG tablet Take 1 tablet (1,000 mg total) by mouth 2 (two) times daily with a meal. 180 tablet 3  . OXYGEN Inhale 2 L into the lungs continuous.    Marland Kitchen oxymorphone (OPANA ER) 20 MG 12 hr tablet Take 20 mg by mouth every 12 (twelve) hours as needed for pain.     Marland Kitchen rOPINIRole (REQUIP) 2 MG tablet TAKE 1 TABLET BY MOUTH THREE TIMES A DAY 90 tablet 5  . tiotropium (SPIRIVA HANDIHALER) 18 MCG inhalation capsule Place 1 capsule (18 mcg total) into inhaler  and inhale daily. 90 capsule 3  . zolpidem (AMBIEN) 10 MG tablet Take 10 mg by mouth at bedtime as needed for sleep.     No current facility-administered medications on file prior to visit.    Allergies  Allergen Reactions  . Guaifenesin & Derivatives     Severe Reaction  . Benazepril Other (See Comments)    cough  . Latex     REACTION: powder on gloves  . Morphine     REACTION: nausea  . Zyrtec [Cetirizine] Other (See Comments)    swelling   Social History   Socioeconomic History  . Marital status: Married    Spouse name: Not on file  . Number of children: 1  . Years of education: Not on file  . Highest education level: Not on file  Occupational History  . Occupation: Retired  Scientific laboratory technician  . Financial resource strain: Not on file  . Food insecurity:    Worry: Not on file    Inability: Not on file  . Transportation needs:    Medical: Not on file    Non-medical: Not on file  Tobacco Use  . Smoking status: Former Smoker    Types: Cigarettes    Last attempt to quit: 08/12/2018    Years since quitting: 0.1  . Smokeless tobacco: Never Used  Substance and Sexual Activity  . Alcohol use: No    Alcohol/week: 0.0 standard drinks  . Drug use: No  . Sexual activity: Not on file  Lifestyle  . Physical activity:    Days per week: Not on file    Minutes per session: Not on file  . Stress: Not on file  Relationships  . Social connections:    Talks on phone: Not on file    Gets together: Not on file    Attends religious service: Not on file    Active member of club or organization: Not on file    Attends meetings of clubs or organizations: Not on file    Relationship status: Not on file  . Intimate partner violence:    Fear of current or ex partner: Not on file    Emotionally abused: Not on file    Physically abused: Not on file    Forced sexual activity: Not on file  Other Topics Concern  . Not on file  Social History Narrative  . Not on file       Review of  Systems  All other systems reviewed and are negative.      Objective:   Physical Exam Vitals signs reviewed.  Constitutional:  General: She is not in acute distress.    Appearance: Normal appearance. She is obese. She is not ill-appearing or diaphoretic.  HENT:     Nose: No congestion or rhinorrhea.  Neck:     Vascular: No carotid bruit.  Cardiovascular:     Rate and Rhythm: Normal rate. Rhythm irregular.     Heart sounds: Normal heart sounds. No murmur.  Pulmonary:     Effort: Pulmonary effort is normal. No respiratory distress.     Breath sounds: No stridor. No wheezing, rhonchi or rales.  Chest:     Chest wall: No tenderness.  Abdominal:     General: Bowel sounds are normal. There is no distension.     Palpations: Abdomen is soft.     Tenderness: There is no abdominal tenderness. There is no guarding.  Musculoskeletal:     Right lower leg: No edema.     Left lower leg: No edema.  Lymphadenopathy:     Cervical: No cervical adenopathy.  Neurological:     Mental Status: She is alert.           Assessment & Plan:  Essential hypertension  Chronic obstructive pulmonary disease, unspecified COPD type ([place])  Pure hypercholesterolemia  Controlled type 2 diabetes mellitus without complication, without long-term current use of insulin (HCC)  Palpitations - Plan: EKG 12-Lead  Thankfully, EKG shows normal sinus rhythm with PVCs.  I believe the PVCs are what I was auscultating.  Therefore no additional treatment is necessary.  Unfortunately I rechecked her blood pressure and found it to be 150/70.  This is more consistent with what she is getting at home.  Her systolic blood pressures are in the 150s despite adding hydrochlorothiazide.  Therefore we will increase hydrochlorothiazide to 25 mg a day and recheck blood pressure in 1 to 2 weeks.  Her diabetes is adequately controlled with A1c of 6.8 and her cholesterol is excellent.  She continues to refrain from smoking.  She  is off oxygen and doing well on Spiriva.  I congratulated the patient on this.  She is occasionally having ocular migraines.  This consist of zigzagging lines in her vision and bright spots in her field of vision that last less than 1 or 2 minutes and then resolved without headaches.  She is seeing a retina specialist every 3 months with no evidence of any retinal disease primarily due to her glaucoma.  At the present time we have elected not to treat her ocular migraines

## 2018-10-22 ENCOUNTER — Telehealth: Payer: Self-pay | Admitting: Family Medicine

## 2018-10-22 NOTE — Telephone Encounter (Signed)
Pt called and states that her BP's are still elevated on top even with the increase in her medication. Usually the top runs 150-160 and the bottom number has been ok - lower 80's.

## 2018-10-25 ENCOUNTER — Other Ambulatory Visit: Payer: Self-pay

## 2018-10-25 ENCOUNTER — Ambulatory Visit (INDEPENDENT_AMBULATORY_CARE_PROVIDER_SITE_OTHER): Payer: Medicare Other | Admitting: Family Medicine

## 2018-10-25 ENCOUNTER — Encounter: Payer: Self-pay | Admitting: Family Medicine

## 2018-10-25 VITALS — BP 138/64 | HR 97 | Temp 98.3°F | Resp 20 | Ht 64.0 in | Wt 181.0 lb

## 2018-10-25 DIAGNOSIS — J441 Chronic obstructive pulmonary disease with (acute) exacerbation: Secondary | ICD-10-CM | POA: Diagnosis not present

## 2018-10-25 MED ORDER — LEVOFLOXACIN 500 MG PO TABS: 500.0000 mg | ORAL_TABLET | Freq: Every day | ORAL | 0 refills | Status: DC

## 2018-10-25 MED ORDER — PREDNISONE 20 MG PO TABS: 60.0000 mg | ORAL_TABLET | Freq: Every day | ORAL | 0 refills | Status: DC

## 2018-10-25 MED ORDER — CYCLOBENZAPRINE HCL 10 MG PO TABS: 10.0000 mg | ORAL_TABLET | Freq: Three times a day (TID) | ORAL | 2 refills | Status: DC | PRN

## 2018-10-25 MED ORDER — IPRATROPIUM-ALBUTEROL 0.5-2.5 (3) MG/3ML IN SOLN: RESPIRATORY_TRACT | 0 refills | Status: DC

## 2018-10-25 NOTE — Telephone Encounter (Signed)
Pt is already on 25mg  of HCTZ and has an apt this afternoon will discuss at that time.

## 2018-10-25 NOTE — Telephone Encounter (Signed)
Increase hctz to 25 mg a day.  Recheck bp in 1 week.

## 2018-10-25 NOTE — Progress Notes (Signed)
Subjective:    Patient ID: [name] [name], female    DOB: August 04, 1951, 68 y.o.   MRN: [id number]  States she feels similar to the way she felt prior to having to go to the hospital recently for COPD exacerbation.  For the last 2 days she reports increasing shortness of breath.  She reports a cough now productive of green sputum.  She denies any fevers or chills or chest pain but she does report worsening shortness of breath and dyspnea on exertion.  In fact the patient started wearing her oxygen again.  She is 97% on 2 L.  On exam today, she has diminished breath sounds bilaterally.  She has faint expiratory wheezes in all 4 lung fields.  She has rhonchorous breath sounds in all 4 lung quadrants and she has left basilar rails.  She is not in respiratory distress.  She has been using DuoNeb twice daily in addition to Spiriva once daily Past Medical History:  Diagnosis Date  . Anxiety   . Arthritis   . CAD (coronary artery disease)   . Chronic kidney disease    Renal insufficiency  . COPD (chronic obstructive pulmonary disease) ([place])   . Depression   . Diabetes mellitus   . Diverticulosis of colon (without mention of hemorrhage)   . Duodenitis without mention of hemorrhage   . GERD (gastroesophageal reflux disease)   . Hemorrhoids   . Hypercholesteremia   . Hypertension   . Insomnia   . Tinnitus   . Vertigo    Past Surgical History:  Procedure Laterality Date  . ANGIOPLASTY    . CHOLECYSTECTOMY    . ROTATOR CUFF REPAIR Right 04/2012  . TUBAL LIGATION     Current Outpatient Medications on File Prior to Visit  Medication Sig Dispense Refill  . albuterol (PROVENTIL HFA;VENTOLIN HFA) 108 (90 Base) MCG/ACT inhaler Inhale 2 puffs into the lungs every 4 (four) hours as needed for wheezing or shortness of breath. 3 Inhaler 1  . atorvastatin (LIPITOR) 20 MG tablet Take 1 tablet (20 mg total) by mouth daily. 90 tablet 3  . bimatoprost (LUMIGAN) 0.03 % ophthalmic solution Place 1 drop into  both eyes at bedtime.     Marland Kitchen dexlansoprazole (DEXILANT) 60 MG capsule Take 1 capsule (60 mg total) by mouth daily. 90 capsule 3  . diltiazem (CARDIZEM) 120 MG tablet Take 1 tablet (120 mg total) by mouth daily. 90 tablet 3  . glucose blood (FREESTYLE LITE) test strip Pt checks BS bid - also needs lancets disp: 1 box with prn refills 100 each 11  . hydrochlorothiazide (MICROZIDE) 12.5 MG capsule Take 1 capsule (12.5 mg total) by mouth daily. (Patient taking differently: Take 25 mg by mouth daily. ) 90 capsule 3  . HYDROcodone-acetaminophen (NORCO) 10-325 MG per tablet Take 1 tablet by mouth every 6 (six) hours as needed for moderate pain.     Marland Kitchen losartan (COZAAR) 100 MG tablet Take 1 tablet (100 mg total) by mouth daily. 90 tablet 3  . metFORMIN (GLUCOPHAGE) 1000 MG tablet Take 1 tablet (1,000 mg total) by mouth 2 (two) times daily with a meal. 180 tablet 3  . OXYGEN Inhale 2 L into the lungs continuous.    Marland Kitchen oxymorphone (OPANA ER) 20 MG 12 hr tablet Take 20 mg by mouth every 12 (twelve) hours as needed for pain.     Marland Kitchen rOPINIRole (REQUIP) 2 MG tablet TAKE 1 TABLET BY MOUTH THREE TIMES A DAY 90 tablet 5  .  tiotropium (SPIRIVA HANDIHALER) 18 MCG inhalation capsule Place 1 capsule (18 mcg total) into inhaler and inhale daily. 90 capsule 3  . zolpidem (AMBIEN) 10 MG tablet Take 10 mg by mouth at bedtime as needed for sleep.     No current facility-administered medications on file prior to visit.    Allergies  Allergen Reactions  . Guaifenesin & Derivatives     Severe Reaction  . Benazepril Other (See Comments)    cough  . Latex     REACTION: powder on gloves  . Morphine     REACTION: nausea  . Zyrtec [Cetirizine] Other (See Comments)    swelling   Social History   Socioeconomic History  . Marital status: Married    Spouse name: Not on file  . Number of children: 1  . Years of education: Not on file  . Highest education level: Not on file  Occupational History  . Occupation: Retired   Scientific laboratory technician  . Financial resource strain: Not on file  . Food insecurity:    Worry: Not on file    Inability: Not on file  . Transportation needs:    Medical: Not on file    Non-medical: Not on file  Tobacco Use  . Smoking status: Former Smoker    Types: Cigarettes    Last attempt to quit: 08/12/2018    Years since quitting: 0.2  . Smokeless tobacco: Never Used  Substance and Sexual Activity  . Alcohol use: No    Alcohol/week: 0.0 standard drinks  . Drug use: No  . Sexual activity: Not on file  Lifestyle  . Physical activity:    Days per week: Not on file    Minutes per session: Not on file  . Stress: Not on file  Relationships  . Social connections:    Talks on phone: Not on file    Gets together: Not on file    Attends religious service: Not on file    Active member of club or organization: Not on file    Attends meetings of clubs or organizations: Not on file    Relationship status: Not on file  . Intimate partner violence:    Fear of current or ex partner: Not on file    Emotionally abused: Not on file    Physically abused: Not on file    Forced sexual activity: Not on file  Other Topics Concern  . Not on file  Social History Narrative  . Not on file   Family History  Problem Relation Age of Onset  . Diabetes Mother   . Hemophilia Mother   . Breast cancer Sister   . Heart disease Father   . Colon polyps Father   . Diabetes Brother   . Colon polyps Sister       Review of Systems  All other systems reviewed and are negative.      Objective:   Physical Exam  Constitutional: She is oriented to person, place, and time. She appears well-developed and well-nourished. No distress.  Neck: Neck supple. No JVD present. No thyromegaly present.  Cardiovascular: Regular rhythm and intact distal pulses. Tachycardia present.  Murmur heard. Pulmonary/Chest: No accessory muscle usage. No tachypnea. No respiratory distress. She has decreased breath sounds in the  right upper field, the right middle field, the right lower field, the left upper field, the left middle field and the left lower field. She has wheezes in the right upper field, the right middle field, the right lower field,  the left upper field, the left middle field and the left lower field. She has rhonchi in the right upper field, the right middle field, the right lower field, the left upper field, the left middle field and the left lower field. She has rales in the left lower field.  Abdominal: Soft. Bowel sounds are normal. She exhibits no distension. There is no abdominal tenderness. There is no rebound and no guarding.  Musculoskeletal:        General: No edema.     Right lower leg: She exhibits no tenderness, no swelling and no deformity. No edema.  Lymphadenopathy:    She has no cervical adenopathy.  Neurological: She is alert and oriented to person, place, and time. She exhibits normal muscle tone. Coordination normal.  Skin: She is not diaphoretic.  Vitals reviewed.         Assessment & Plan:  Patient has a COPD exacerbation and possibly early community-acquired pneumonia.  Begin Levaquin 500 mg daily for 7 days.  Use Depo-Medrol 80 mg IM x1 now.  Begin prednisone 60 mg daily for the next 5 days starting tomorrow.  Liberalize DuoNeb to every 6 hours.  Continue Spiriva.  Reassess in 24 to 48 hours or sooner if worsening.

## 2018-11-10 ENCOUNTER — Other Ambulatory Visit: Payer: Self-pay

## 2018-11-10 ENCOUNTER — Other Ambulatory Visit: Payer: Self-pay | Admitting: Podiatry

## 2018-11-10 ENCOUNTER — Ambulatory Visit (INDEPENDENT_AMBULATORY_CARE_PROVIDER_SITE_OTHER): Payer: Medicare Other | Admitting: Podiatry

## 2018-11-10 ENCOUNTER — Ambulatory Visit (INDEPENDENT_AMBULATORY_CARE_PROVIDER_SITE_OTHER): Payer: Medicare Other

## 2018-11-10 ENCOUNTER — Encounter: Payer: Self-pay | Admitting: Podiatry

## 2018-11-10 VITALS — BP 92/57 | HR 87 | Temp 98.4°F | Resp 16

## 2018-11-10 DIAGNOSIS — M79671 Pain in right foot: Secondary | ICD-10-CM

## 2018-11-10 DIAGNOSIS — M79672 Pain in left foot: Secondary | ICD-10-CM | POA: Diagnosis not present

## 2018-11-10 DIAGNOSIS — L600 Ingrowing nail: Secondary | ICD-10-CM

## 2018-11-10 DIAGNOSIS — M2041 Other hammer toe(s) (acquired), right foot: Secondary | ICD-10-CM

## 2018-11-10 DIAGNOSIS — M2042 Other hammer toe(s) (acquired), left foot: Secondary | ICD-10-CM | POA: Diagnosis not present

## 2018-11-10 MED ORDER — NEOMYCIN-POLYMYXIN-HC 3.5-10000-1 OT SOLN: OTIC | 1 refills | Status: DC

## 2018-11-10 NOTE — Progress Notes (Signed)
Subjective:   Patient ID: [name] [name], female   DOB: 68 y.o.   MRN: [id number]   HPI Patient presents stating that I am getting a painful ingrown toenail right and I have my left nail removed a while ago.  Also I do get a full feeling in both my feet and I feel like there is something wrong with the bones and I wanted to get that checked and patient is a diabetic and states her level has been fairly stable but wants to get it checked.  Patient does not smoke likes to be active   Review of Systems  All other systems reviewed and are negative.       Objective:  Physical Exam Vitals signs and nursing note reviewed.  Constitutional:      Appearance: She is well-developed.  Pulmonary:     Effort: Pulmonary effort is normal.  Musculoskeletal: Normal range of motion.  Skin:    General: Skin is warm.  Neurological:     Mental Status: She is alert.     Neurovascular status intact muscle strength was found to be adequate range of motion was within normal limits with patient found to have incurvated medial border of the right hallux that is painful when pressed and making shoe gear difficult.  There is no redness or drainage noted and it is quite painful when pressed.  Patient has moderate forefoot swelling bilateral with the digits that have contracture but no indications of advanced arthritic processes       Assessment:  Ingrown toenail deformity right hallux medial border with inflammatory capsulitis bilateral and moderate hammertoe deformity bilateral     Plan:  H&P all conditions and x-rays reviewed with patient.  At this point I recommended correction of the ingrown patient wants this done and I explained procedure risk and she signed consent form.  I went ahead and infiltrated the right hallux 60 mg like Marcaine mixture removed the medial border exposed matrix and applied phenol 3 applications 30 seconds followed by alcohol lavage sterile dressing.  Gave instructions on soaks  and leaving the dressing on 24 hours but to take it off earlier if issues were to occur and also wrote prescription for drops and encouraged to call with questions  X-rays indicate digital deformities bilateral with mild arthritic issues but no indication to stress fracture or advanced deformity

## 2018-11-10 NOTE — Patient Instructions (Addendum)
Diabetes Mellitus and Foot Care Foot care is an important part of your health, especially when you have diabetes. Diabetes may cause you to have problems because of poor blood flow (circulation) to your feet and legs, which can cause your skin to:  Become thinner and drier.  Break more easily.  Heal more slowly.  Peel and crack. You may also have nerve damage (neuropathy) in your legs and feet, causing decreased feeling in them. This means that you may not notice minor injuries to your feet that could lead to more serious problems. Noticing and addressing any potential problems early is the best way to prevent future foot problems. How to care for your feet Foot hygiene  Wash your feet daily with warm water and mild soap. Do not use hot water. Then, pat your feet and the areas between your toes until they are completely dry. Do not soak your feet as this can dry your skin.  Trim your toenails straight across. Do not dig under them or around the cuticle. File the edges of your nails with an emery board or nail file.  Apply a moisturizing lotion or petroleum jelly to the skin on your feet and to dry, brittle toenails. Use lotion that does not contain alcohol and is unscented. Do not apply lotion between your toes. Shoes and socks  Wear clean socks or stockings every day. Make sure they are not too tight. Do not wear knee-high stockings since they may decrease blood flow to your legs.  Wear shoes that fit properly and have enough cushioning. Always look in your shoes before you put them on to be sure there are no objects inside.  To break in new shoes, wear them for just a few hours a day. This prevents injuries on your feet. Wounds, scrapes, corns, and calluses  Check your feet daily for blisters, cuts, bruises, sores, and redness. If you cannot see the bottom of your feet, use a mirror or ask someone for help.  Do not cut corns or calluses or try to remove them with medicine.  If you  find a minor scrape, cut, or break in the skin on your feet, keep it and the skin around it clean and dry. You may clean these areas with mild soap and water. Do not clean the area with peroxide, alcohol, or iodine.  If you have a wound, scrape, corn, or callus on your foot, look at it several times a day to make sure it is healing and not infected. Check for: ? Redness, swelling, or pain. ? Fluid or blood. ? Warmth. ? Pus or a bad smell. General instructions  Do not cross your legs. This may decrease blood flow to your feet.  Do not use heating pads or hot water bottles on your feet. They may burn your skin. If you have lost feeling in your feet or legs, you may not know this is happening until it is too late.  Protect your feet from hot and cold by wearing shoes, such as at the beach or on hot pavement.  Schedule a complete foot exam at least once a year (annually) or more often if you have foot problems. If you have foot problems, report any cuts, sores, or bruises to your health care provider immediately. Contact a health care provider if:  You have a medical condition that increases your risk of infection and you have any cuts, sores, or bruises on your feet.  You have an injury that is not   healing.  You have redness on your legs or feet.  You feel burning or tingling in your legs or feet.  You have pain or cramps in your legs and feet.  Your legs or feet are numb.  Your feet always feel cold.  You have pain around a toenail. Get help right away if:  You have a wound, scrape, corn, or callus on your foot and: ? You have pain, swelling, or redness that gets worse. ? You have fluid or blood coming from the wound, scrape, corn, or callus. ? Your wound, scrape, corn, or callus feels warm to the touch. ? You have pus or a bad smell coming from the wound, scrape, corn, or callus. ? You have a fever. ? You have a red line going up your leg. Summary  Check your feet every day  for cuts, sores, red spots, swelling, and blisters.  Moisturize feet and legs daily.  Wear shoes that fit properly and have enough cushioning.  If you have foot problems, report any cuts, sores, or bruises to your health care provider immediately.  Schedule a complete foot exam at least once a year (annually) or more often if you have foot problems. This information is not intended to replace advice given to you by your health care provider. Make sure you discuss any questions you have with your health care provider. Document Released: 07/25/2000 Document Revised: 09/09/2017 Document Reviewed: 08/29/2016 Elsevier Interactive Patient Education  2019 Strum Toe  Hammer toe is a change in the shape (a deformity) of your toe. The deformity causes the middle joint of your toe to stay bent. This causes pain, especially when you are wearing shoes. Hammer toe starts gradually. At first, the toe can be straightened. Gradually over time, the deformity becomes stiff and permanent. Early treatments to keep the toe straight may relieve pain. As the deformity becomes stiff and permanent, surgery may be needed to straighten the toe. What are the causes? Hammer toe is caused by abnormal bending of the toe joint that is closest to your foot. It happens gradually over time. This pulls on the muscles and connections (tendons) of the toe joint, making them weak and stiff. It is often related to wearing shoes that are too short or narrow and do not let your toes straighten. What increases the risk? You may be at greater risk for hammer toe if you:  Are female.  Are older.  Wear shoes that are too small.  Wear high-heeled shoes that pinch your toes.  Are a Engineer, mining.  Have a second toe that is longer than your big toe (first toe).  Injure your foot or toe.  Have arthritis.  Have a family history of hammer toe.  Have a nerve or muscle disorder. What are the signs or  symptoms? The main symptoms of this condition are pain and deformity of the toe. The pain is worse when wearing shoes, walking, or running. Other symptoms may include:  Corns or calluses over the bent part of the toe or between the toes.  Redness and a burning feeling on the toe.  An open sore that forms on the top of the toe.  Not being able to straighten the toe. How is this diagnosed? This condition is diagnosed based on your symptoms and a physical exam. During the exam, your health care provider will try to straighten your toe to see how stiff the deformity is. You may also have tests, such  as:  A blood test to check for rheumatoid arthritis.  An X-ray to show how severe the deformity is. How is this treated? Treatment for this condition will depend on how stiff the deformity is. Surgery is often needed. However, sometimes a hammer toe can be straightened without surgery. Treatments that do not involve surgery include:  Taping the toe into a straightened position.  Using pads and cushions to protect the toe (orthotics).  Wearing shoes that provide enough room for the toes.  Doing toe-stretching exercises at home.  Taking an NSAID to reduce pain and swelling. If these treatments do not help or the toe cannot be straightened, surgery is the next option. The most common surgeries used to straighten a hammer toe include:  Arthroplasty. In this procedure, part of the joint is removed, and that allows the toe to straighten.  Fusion. In this procedure, cartilage between the two bones of the joint is taken out and the bones are fused together into one longer bone.  Implantation. In this procedure, part of the bone is removed and replaced with an implant to let the toe move again.  Flexor tendon transfer. In this procedure, the tendons that curl the toes down (flexor tendons) are repositioned. Follow these instructions at home:  Take over-the-counter and prescription medicines only  as told by your health care provider.  Do toe straightening and stretching exercises as told by your health care provider.  Keep all follow-up visits as told by your health care provider. This is important. How is this prevented?  Wear shoes that give your toes enough room and do not cause pain.  Do not wear high-heeled shoes. Contact a health care provider if:  Your pain gets worse.  Your toe becomes red or swollen.  You develop an open sore on your toe. This information is not intended to replace advice given to you by your health care provider. Make sure you discuss any questions you have with your health care provider. Document Released: 07/25/2000 Document Revised: 02/23/2017 Document Reviewed: 11/21/2015 Elsevier Interactive Patient Education  2019 [place] Instructions    THE DAY AFTER THE PROCEDURE  Place 1/4 cup of epsom salts in a quart of warm tap water.  Submerge your foot or feet with outer bandage intact for the initial soak; this will allow the bandage to become moist and wet for easy lift off.  Once you remove your bandage, continue to soak in the solution for 20 minutes.  This soak should be done twice a day.  Next, remove your foot or feet from solution, blot dry the affected area and cover.  You may use a band aid large enough to cover the area or use gauze and tape.  Apply other medications to the area as directed by the doctor such as polysporin neosporin.  IF YOUR SKIN BECOMES IRRITATED WHILE USING THESE INSTRUCTIONS, IT IS OKAY TO SWITCH TO  WHITE VINEGAR AND WATER. Or you may use antibacterial soap and water to keep the toe clean  Monitor for any signs/symptoms of infection. Call the office immediately if any occur or go directly to the emergency room. Call with any questions/concerns.    [place] Instructions-Post Nail Surgery  You have had your ingrown toenail and root treated with a chemical.  This chemical causes a burn that will  drain and ooze like a blister.  This can drain for 6-8 weeks or longer.  It is important to keep this area clean,  covered, and follow the soaking instructions dispensed at the time of your surgery.  This area will eventually dry and form a scab.  Once the scab forms you no longer need to soak or apply a dressing.  If at any time you experience an increase in pain, redness, swelling, or drainage, you should contact the office as soon as possible.\

## 2018-11-10 NOTE — Progress Notes (Signed)
   Subjective:    Patient ID: [name] [name], female    DOB: 1951/03/26, 68 y.o.   MRN: [id number]  HPI    Review of Systems  All other systems reviewed and are negative.      Objective:   Physical Exam        Assessment & Plan:

## 2018-11-12 ENCOUNTER — Other Ambulatory Visit: Payer: Self-pay | Admitting: Family Medicine

## 2018-11-12 MED ORDER — HYDROCHLOROTHIAZIDE 25 MG PO TABS: 25.0000 mg | ORAL_TABLET | Freq: Every day | ORAL | 0 refills | Status: DC

## 2018-11-12 MED ORDER — HYDROCHLOROTHIAZIDE 25 MG PO TABS: 25.0000 mg | ORAL_TABLET | Freq: Every day | ORAL | 3 refills | Status: DC

## 2018-12-03 ENCOUNTER — Ambulatory Visit (INDEPENDENT_AMBULATORY_CARE_PROVIDER_SITE_OTHER): Payer: Medicare Other | Admitting: Family Medicine

## 2018-12-03 ENCOUNTER — Telehealth: Payer: Self-pay | Admitting: Family Medicine

## 2018-12-03 ENCOUNTER — Other Ambulatory Visit: Payer: Self-pay

## 2018-12-03 DIAGNOSIS — I10 Essential (primary) hypertension: Secondary | ICD-10-CM | POA: Diagnosis not present

## 2018-12-03 MED ORDER — ZOLPIDEM TARTRATE 10 MG PO TABS: 10.0000 mg | ORAL_TABLET | Freq: Every evening | ORAL | 1 refills | Status: DC | PRN

## 2018-12-03 MED ORDER — DOXAZOSIN MESYLATE 4 MG PO TABS: 4.0000 mg | ORAL_TABLET | Freq: Every day | ORAL | 1 refills | Status: DC

## 2018-12-03 NOTE — Telephone Encounter (Signed)
Needs phone visit

## 2018-12-03 NOTE — Telephone Encounter (Signed)
Pt called and states that she is still having issues with her BP - she states that you increase her HCTZ to 50mg  but I do not see that in chart (just want to make sure that is correct before I send it to pharmacy). Her BP the other day was 180/75 then an hour later 160/75. She said would would do a phone visit or she would come in if necessary.

## 2018-12-03 NOTE — Telephone Encounter (Signed)
Apt made

## 2018-12-03 NOTE — Progress Notes (Signed)
Subjective:    Patient ID: [name] [name], female    DOB: 1951-06-01, 68 y.o.   MRN: [id number]  HPI  Patient is being seen today for an office visit over the telephone.  She consents to be seen by telephone.  Phone call began at 1237.  Phone call concluded at 1250.  Patient has a history of hypertension as well as cardiovascular disease.  She is currently on hydrochlorothiazide 25 mg a day, losartan 100 mg a day, and Cardizem 120 mg a day.  Recently her blood pressure has been elevated significantly.  She has had a nurse who has been caring for her husband at home check his blood pressure and her blood pressure and her blood pressure has been ranging between 161 and 181/70-79.  When that happened, she independently increased her hydrochlorothiazide to 50 mg a day but has seen no significant drop in her blood pressure.  She denies any chest pain shortness of breath or dyspnea on exertion.  She does request that I refill her Ambien which she takes 5 to 10 mg at night to help her sleep. Past Medical History:  Diagnosis Date  . Anxiety   . Arthritis   . CAD (coronary artery disease)   . Chronic kidney disease    Renal insufficiency  . COPD (chronic obstructive pulmonary disease) ([place])   . Depression   . Diabetes mellitus   . Diverticulosis of colon (without mention of hemorrhage)   . Duodenitis without mention of hemorrhage   . GERD (gastroesophageal reflux disease)   . Hemorrhoids   . Hypercholesteremia   . Hypertension   . Insomnia   . Tinnitus   . Vertigo    Past Surgical History:  Procedure Laterality Date  . ANGIOPLASTY    . CHOLECYSTECTOMY    . ROTATOR CUFF REPAIR Right 04/2012  . TUBAL LIGATION     Current Outpatient Medications on File Prior to Visit  Medication Sig Dispense Refill  . albuterol (PROVENTIL HFA;VENTOLIN HFA) 108 (90 Base) MCG/ACT inhaler Inhale 2 puffs into the lungs every 4 (four) hours as needed for wheezing or shortness of breath. 3 Inhaler 1  .  atorvastatin (LIPITOR) 20 MG tablet Take 1 tablet (20 mg total) by mouth daily. 90 tablet 3  . bimatoprost (LUMIGAN) 0.03 % ophthalmic solution Place 1 drop into both eyes at bedtime.     . cyclobenzaprine (FLEXERIL) 10 MG tablet Take 1 tablet (10 mg total) by mouth 3 (three) times daily as needed for muscle spasms. 270 tablet 2  . dexlansoprazole (DEXILANT) 60 MG capsule Take 1 capsule (60 mg total) by mouth daily. 90 capsule 3  . diltiazem (CARDIZEM) 120 MG tablet Take 1 tablet (120 mg total) by mouth daily. 90 tablet 3  . glucose blood (FREESTYLE LITE) test strip Pt checks BS bid - also needs lancets disp: 1 box with prn refills 100 each 11  . hydrochlorothiazide (HYDRODIURIL) 25 MG tablet Take 1 tablet (25 mg total) by mouth daily. 90 tablet 3  . HYDROcodone-acetaminophen (NORCO) 10-325 MG per tablet Take 1 tablet by mouth every 6 (six) hours as needed for moderate pain.     Marland Kitchen ipratropium-albuterol (DUONEB) 0.5-2.5 (3) MG/3ML SOLN INHALE 1 VIAL VIA NEBULIZER EVERY 6 HOURS AS NEEDED 360 mL 0  . levofloxacin (LEVAQUIN) 500 MG tablet Take 1 tablet (500 mg total) by mouth daily. 7 tablet 0  . losartan (COZAAR) 100 MG tablet Take 1 tablet (100 mg total) by mouth daily. 90 tablet  3  . metFORMIN (GLUCOPHAGE) 1000 MG tablet Take 1 tablet (1,000 mg total) by mouth 2 (two) times daily with a meal. 180 tablet 3  . neomycin-polymyxin-hydrocortisone (CORTISPORIN) OTIC solution Apply 1-2 drops to toe after soaking BID 10 mL 1  . OXYGEN Inhale 2 L into the lungs continuous.    Marland Kitchen oxymorphone (OPANA ER) 20 MG 12 hr tablet Take 20 mg by mouth every 12 (twelve) hours as needed for pain.     Marland Kitchen rOPINIRole (REQUIP) 2 MG tablet TAKE 1 TABLET BY MOUTH THREE TIMES A DAY 90 tablet 5  . tiotropium (SPIRIVA HANDIHALER) 18 MCG inhalation capsule Place 1 capsule (18 mcg total) into inhaler and inhale daily. 90 capsule 3  . zolpidem (AMBIEN) 10 MG tablet Take 10 mg by mouth at bedtime as needed for sleep.     No current  facility-administered medications on file prior to visit.    Allergies  Allergen Reactions  . Guaifenesin & Derivatives     Severe Reaction  . Benazepril Other (See Comments)    cough  . Latex     REACTION: powder on gloves  . Morphine     REACTION: nausea  . Zyrtec [Cetirizine] Other (See Comments)    swelling   Social History   Socioeconomic History  . Marital status: Married    Spouse name: Not on file  . Number of children: 1  . Years of education: Not on file  . Highest education level: Not on file  Occupational History  . Occupation: Retired  Scientific laboratory technician  . Financial resource strain: Not on file  . Food insecurity:    Worry: Not on file    Inability: Not on file  . Transportation needs:    Medical: Not on file    Non-medical: Not on file  Tobacco Use  . Smoking status: Former Smoker    Types: Cigarettes    Last attempt to quit: 08/12/2018    Years since quitting: 0.3  . Smokeless tobacco: Never Used  Substance and Sexual Activity  . Alcohol use: No    Alcohol/week: 0.0 standard drinks  . Drug use: No  . Sexual activity: Not on file  Lifestyle  . Physical activity:    Days per week: Not on file    Minutes per session: Not on file  . Stress: Not on file  Relationships  . Social connections:    Talks on phone: Not on file    Gets together: Not on file    Attends religious service: Not on file    Active member of club or organization: Not on file    Attends meetings of clubs or organizations: Not on file    Relationship status: Not on file  . Intimate partner violence:    Fear of current or ex partner: Not on file    Emotionally abused: Not on file    Physically abused: Not on file    Forced sexual activity: Not on file  Other Topics Concern  . Not on file  Social History Narrative  . Not on file     Review of Systems  All other systems reviewed and are negative.      Objective:   Physical Exam No physical exam was performed today as  patient was seen as a telephone visit       Assessment & Plan:  Essential hypertension  Blood pressure is too high.  I would like her to remain on hydrochlorothiazide 25 mg a day  instead of 50 but I will start the patient on Cardura 4 mg daily and she will call with blood pressures in 1 week to tell me how they are doing.  I also refilled her Ambien 10 mg p.o. nightly as needed insomnia

## 2018-12-05 ENCOUNTER — Other Ambulatory Visit: Payer: Self-pay | Admitting: Family Medicine

## 2018-12-06 ENCOUNTER — Ambulatory Visit (INDEPENDENT_AMBULATORY_CARE_PROVIDER_SITE_OTHER): Payer: Medicare Other | Admitting: Family Medicine

## 2018-12-06 ENCOUNTER — Telehealth: Payer: Self-pay | Admitting: Family Medicine

## 2018-12-06 ENCOUNTER — Other Ambulatory Visit: Payer: Self-pay

## 2018-12-06 DIAGNOSIS — I10 Essential (primary) hypertension: Secondary | ICD-10-CM

## 2018-12-06 NOTE — Progress Notes (Signed)
Subjective:    Patient ID: [name] [name], female    DOB: September 12, 1950, 68 y.o.   MRN: [id number]  HPI 12/03/18 Patient is being seen today for an office visit over the telephone.  She consents to be seen by telephone.  Phone call began at 1237.  Phone call concluded at 1250.  Patient has a history of hypertension as well as cardiovascular disease.  She is currently on hydrochlorothiazide 25 mg a day, losartan 100 mg a day, and Cardizem 120 mg a day.  Recently her blood pressure has been elevated significantly.  She has had a nurse who has been caring for her husband at home check his blood pressure and her blood pressure and her blood pressure has been ranging between 161 and 181/70-79.  When that happened, she independently increased her hydrochlorothiazide to 50 mg a day but has seen no significant drop in her blood pressure.  She denies any chest pain shortness of breath or dyspnea on exertion.  She does request that I refill her Ambien which she takes 5 to 10 mg at night to help her sleep.  At that time, my plan was: Blood pressure is too high.  I would like her to remain on hydrochlorothiazide 25 mg a day instead of 50 but I will start the patient on Cardura 4 mg daily and she will call with blood pressures in 1 week to tell me how they are doing.  I also refilled her Ambien 10 mg p.o. nightly as needed insomnia  12/06/18 Patient is being seen today as a follow-up.  She is being seen by telephone.  She consents to be seen as a phone visit.  She is currently at home.  I am currently my office.  Phone call began at 2:33 PM.  Phone call ended at 2:45 PM.  Patient bought a wrist blood pressure cuff over the weekend.  She is getting highly variable numbers.  One time she checked her blood pressure on Saturday was 121/60.  She checked it again on Sunday and found it to be 74/38!Marland Kitchen  She had a nurse recheck her blood pressure on Sunday and her blood pressure was found to be 129/64.  Therefore her blood  pressure seem to be consistently in the 120s over 60s.  However she reports orthostatic dizziness on carvedilol 4 mg a day.  Sunday she tried switching to 2 mg a day and still felt orthostatic dizziness although not as severe. Past Medical History:  Diagnosis Date   Anxiety    Arthritis    CAD (coronary artery disease)    Chronic kidney disease    Renal insufficiency   COPD (chronic obstructive pulmonary disease) ([facility])    Depression    Diabetes mellitus    Diverticulosis of colon (without mention of hemorrhage)    Duodenitis without mention of hemorrhage    GERD (gastroesophageal reflux disease)    Hemorrhoids    Hypercholesteremia    Hypertension    Insomnia    Tinnitus    Vertigo    Past Surgical History:  Procedure Laterality Date   ANGIOPLASTY     CHOLECYSTECTOMY     ROTATOR CUFF REPAIR Right 04/2012   TUBAL LIGATION     Current Outpatient Medications on File Prior to Visit  Medication Sig Dispense Refill   albuterol (PROVENTIL HFA;VENTOLIN HFA) 108 (90 Base) MCG/ACT inhaler Inhale 2 puffs into the lungs every 4 (four) hours as needed for wheezing or shortness of breath. 3 Inhaler 1  atorvastatin (LIPITOR) 20 MG tablet Take 1 tablet (20 mg total) by mouth daily. 90 tablet 3   bimatoprost (LUMIGAN) 0.03 % ophthalmic solution Place 1 drop into both eyes at bedtime.      cyclobenzaprine (FLEXERIL) 10 MG tablet Take 1 tablet (10 mg total) by mouth 3 (three) times daily as needed for muscle spasms. 270 tablet 2   dexlansoprazole (DEXILANT) 60 MG capsule Take 1 capsule (60 mg total) by mouth daily. 90 capsule 3   diltiazem (CARDIZEM) 120 MG tablet Take 1 tablet (120 mg total) by mouth daily. 90 tablet 3   doxazosin (CARDURA) 4 MG tablet Take 1 tablet (4 mg total) by mouth daily. 30 tablet 1   glucose blood (FREESTYLE LITE) test strip Pt checks BS bid - also needs lancets disp: 1 box with prn refills 100 each 11   hydrochlorothiazide (HYDRODIURIL) 25  MG tablet TAKE 1 TABLET BY MOUTH EVERY DAY 30 tablet 0   HYDROcodone-acetaminophen (NORCO) 10-325 MG per tablet Take 1 tablet by mouth every 6 (six) hours as needed for moderate pain.      ipratropium-albuterol (DUONEB) 0.5-2.5 (3) MG/3ML SOLN INHALE 1 VIAL VIA NEBULIZER EVERY 6 HOURS AS NEEDED 360 mL 0   levofloxacin (LEVAQUIN) 500 MG tablet Take 1 tablet (500 mg total) by mouth daily. 7 tablet 0   losartan (COZAAR) 100 MG tablet Take 1 tablet (100 mg total) by mouth daily. 90 tablet 3   metFORMIN (GLUCOPHAGE) 1000 MG tablet Take 1 tablet (1,000 mg total) by mouth 2 (two) times daily with a meal. 180 tablet 3   neomycin-polymyxin-hydrocortisone (CORTISPORIN) OTIC solution Apply 1-2 drops to toe after soaking BID 10 mL 1   OXYGEN Inhale 2 L into the lungs continuous.     oxymorphone (OPANA ER) 20 MG 12 hr tablet Take 20 mg by mouth every 12 (twelve) hours as needed for pain.      rOPINIRole (REQUIP) 2 MG tablet TAKE 1 TABLET BY MOUTH THREE TIMES A DAY 90 tablet 5   tiotropium (SPIRIVA HANDIHALER) 18 MCG inhalation capsule Place 1 capsule (18 mcg total) into inhaler and inhale daily. 90 capsule 3   zolpidem (AMBIEN) 10 MG tablet Take 10 mg by mouth at bedtime as needed for sleep.     zolpidem (AMBIEN) 10 MG tablet Take 1 tablet (10 mg total) by mouth at bedtime as needed for up to 30 days for sleep. 90 tablet 1   No current facility-administered medications on file prior to visit.    Allergies  Allergen Reactions   Guaifenesin & Derivatives     Severe Reaction   Benazepril Other (See Comments)    cough   Latex     REACTION: powder on gloves   Morphine     REACTION: nausea   Zyrtec [Cetirizine] Other (See Comments)    swelling   Social History   Socioeconomic History   Marital status: Married    Spouse name: Not on file   Number of children: 1   Years of education: Not on file   Highest education level: Not on file  Occupational History   Occupation: Retired    Scientist, product/process development strain: Not on file   Food insecurity:    Worry: Not on file    Inability: Not on file   Transportation needs:    Medical: Not on file    Non-medical: Not on file  Tobacco Use   Smoking status: Former Smoker    Types:  Cigarettes    Last attempt to quit: 08/12/2018    Years since quitting: 0.3   Smokeless tobacco: Never Used  Substance and Sexual Activity   Alcohol use: No    Alcohol/week: 0.0 standard drinks   Drug use: No   Sexual activity: Not on file  Lifestyle   Physical activity:    Days per week: Not on file    Minutes per session: Not on file   Stress: Not on file  Relationships   Social connections:    Talks on phone: Not on file    Gets together: Not on file    Attends religious service: Not on file    Active member of club or organization: Not on file    Attends meetings of clubs or organizations: Not on file    Relationship status: Not on file   Intimate partner violence:    Fear of current or ex partner: Not on file    Emotionally abused: Not on file    Physically abused: Not on file    Forced sexual activity: Not on file  Other Topics Concern   Not on file  Social History Narrative   Not on file     Review of Systems  All other systems reviewed and are negative.      Objective:   Physical Exam No physical exam was performed today as patient was seen as a telephone visit       Assessment & Plan:  Essential hypertension  Decrease Cardura to 2 mg a day and also decrease losartan to 50 mg a day.  Take losartan in the morning.  Take dura in the evenings.  Reassess via telephone with blood pressure on Friday or sooner if symptoms worsen.

## 2018-12-15 ENCOUNTER — Other Ambulatory Visit: Payer: Self-pay | Admitting: Family Medicine

## 2018-12-15 MED ORDER — HYDROCHLOROTHIAZIDE 25 MG PO TABS: 25.0000 mg | ORAL_TABLET | Freq: Every day | ORAL | 1 refills | Status: DC

## 2018-12-15 NOTE — Addendum Note (Signed)
Addended by: Sheral Flow on: 12/15/2018 10:24 AM   Modules accepted: Orders

## 2018-12-17 DIAGNOSIS — H40053 Ocular hypertension, bilateral: Secondary | ICD-10-CM | POA: Diagnosis not present

## 2018-12-25 ENCOUNTER — Other Ambulatory Visit: Payer: Self-pay | Admitting: Family Medicine

## 2018-12-27 DIAGNOSIS — Z20828 Contact with and (suspected) exposure to other viral communicable diseases: Secondary | ICD-10-CM | POA: Diagnosis not present

## 2019-01-20 DIAGNOSIS — H40013 Open angle with borderline findings, low risk, bilateral: Secondary | ICD-10-CM | POA: Diagnosis not present

## 2019-02-07 ENCOUNTER — Other Ambulatory Visit (HOSPITAL_COMMUNITY): Payer: Self-pay | Admitting: Family Medicine

## 2019-02-07 DIAGNOSIS — Z1231 Encounter for screening mammogram for malignant neoplasm of breast: Secondary | ICD-10-CM

## 2019-02-18 ENCOUNTER — Ambulatory Visit (INDEPENDENT_AMBULATORY_CARE_PROVIDER_SITE_OTHER): Payer: Medicare Other | Admitting: Podiatry

## 2019-02-18 ENCOUNTER — Encounter: Payer: Self-pay | Admitting: Podiatry

## 2019-02-18 ENCOUNTER — Other Ambulatory Visit: Payer: Self-pay

## 2019-02-18 VITALS — Temp 97.7°F

## 2019-02-18 DIAGNOSIS — L03031 Cellulitis of right toe: Secondary | ICD-10-CM

## 2019-02-18 NOTE — Patient Instructions (Signed)

## 2019-02-22 NOTE — Progress Notes (Signed)
Subjective:   Patient ID: [name] [name], female   DOB: 68 y.o.   MRN: [id number]   HPI Patient presents with painful nail bed right hallux stating it is been bothersome and she wanted to get it checked   ROS      Objective:  Physical Exam  Neurovascular status with small amount of drainage medial border right hallux that is localized with no proximal edema erythema or drainage noted     Assessment:  Paronychia infection right hallux medial border     Plan:  H&P condition reviewed and recommended soaks for this condition.  I went ahead today and I advised on soaks and I did go ahead and I infiltrated the right hallux 60 mg like Marcaine mixture using sterile instrumentation I remove the border removed all crusted tissue proud flesh and allowed channel for drainage.  Patient will be seen back to recheck and was given instructions on what to do if any pain were to occur or redness and to call us immediately

## 2019-02-24 ENCOUNTER — Other Ambulatory Visit: Payer: Self-pay

## 2019-02-25 ENCOUNTER — Other Ambulatory Visit: Payer: Self-pay

## 2019-02-25 ENCOUNTER — Other Ambulatory Visit: Payer: Medicare Other

## 2019-02-25 DIAGNOSIS — E119 Type 2 diabetes mellitus without complications: Secondary | ICD-10-CM | POA: Diagnosis not present

## 2019-02-25 DIAGNOSIS — E7800 Pure hypercholesterolemia, unspecified: Secondary | ICD-10-CM | POA: Diagnosis not present

## 2019-02-25 DIAGNOSIS — I10 Essential (primary) hypertension: Secondary | ICD-10-CM | POA: Diagnosis not present

## 2019-02-26 LAB — COMPREHENSIVE METABOLIC PANEL
AG Ratio: 1.2 (calc) (ref 1.0–2.5)
ALT: 23 U/L (ref 6–29)
AST: 20 U/L (ref 10–35)
Albumin: 3.8 g/dL (ref 3.6–5.1)
Alkaline phosphatase (APISO): 36 U/L — ABNORMAL LOW (ref 37–153)
BUN: 15 mg/dL (ref 7–25)
CO2: 27 mmol/L (ref 20–32)
Calcium: 9.5 mg/dL (ref 8.6–10.4)
Chloride: 99 mmol/L (ref 98–110)
Creat: 0.84 mg/dL (ref 0.50–0.99)
Globulin: 3.2 g/dL (calc) (ref 1.9–3.7)
Glucose, Bld: 144 mg/dL — ABNORMAL HIGH (ref 65–99)
Potassium: 4.4 mmol/L (ref 3.5–5.3)
Sodium: 137 mmol/L (ref 135–146)
Total Bilirubin: 0.2 mg/dL (ref 0.2–1.2)
Total Protein: 7 g/dL (ref 6.1–8.1)

## 2019-02-26 LAB — CBC WITH DIFFERENTIAL/PLATELET
Absolute Monocytes: 774 cells/uL (ref 200–950)
Basophils Absolute: 71 cells/uL (ref 0–200)
Basophils Relative: 0.8 %
Eosinophils Absolute: 481 cells/uL (ref 15–500)
Eosinophils Relative: 5.4 %
HCT: 37.3 % (ref 35.0–45.0)
Hemoglobin: 12.6 g/dL (ref 11.7–15.5)
Lymphs Abs: 3195 cells/uL (ref 850–3900)
MCH: 31.1 pg (ref 27.0–33.0)
MCHC: 33.8 g/dL (ref 32.0–36.0)
MCV: 92.1 fL (ref 80.0–100.0)
MPV: 9.7 fL (ref 7.5–12.5)
Monocytes Relative: 8.7 %
Neutro Abs: 4379 cells/uL (ref 1500–7800)
Neutrophils Relative %: 49.2 %
Platelets: 350 10*3/uL (ref 140–400)
RBC: 4.05 10*6/uL (ref 3.80–5.10)
RDW: 12.8 % (ref 11.0–15.0)
Total Lymphocyte: 35.9 %
WBC: 8.9 10*3/uL (ref 3.8–10.8)

## 2019-02-26 LAB — LIPID PANEL
Cholesterol: 106 mg/dL (ref <200)
HDL: 51 mg/dL (ref >=50)
LDL Cholesterol (Calc): 41 mg/dL (calc)
Non-HDL Cholesterol (Calc): 55 mg/dL (calc) (ref <130)
Total CHOL/HDL Ratio: 2.1 (calc) (ref <5.0)
Triglycerides: 64 mg/dL (ref <150)

## 2019-02-26 LAB — HEMOGLOBIN A1C
Hgb A1c MFr Bld: 6.9 % of total Hgb — ABNORMAL HIGH (ref <5.7)
Mean Plasma Glucose: 151 (calc)
eAG (mmol/L): 8.4 (calc)

## 2019-02-28 ENCOUNTER — Ambulatory Visit (INDEPENDENT_AMBULATORY_CARE_PROVIDER_SITE_OTHER): Payer: Medicare Other | Admitting: Family Medicine

## 2019-02-28 ENCOUNTER — Encounter: Payer: Self-pay | Admitting: Family Medicine

## 2019-02-28 VITALS — BP 166/60 | HR 90 | Temp 98.5°F | Resp 20 | Ht 64.0 in | Wt 192.0 lb

## 2019-02-28 DIAGNOSIS — I10 Essential (primary) hypertension: Secondary | ICD-10-CM

## 2019-02-28 DIAGNOSIS — E119 Type 2 diabetes mellitus without complications: Secondary | ICD-10-CM | POA: Diagnosis not present

## 2019-02-28 DIAGNOSIS — E7800 Pure hypercholesterolemia, unspecified: Secondary | ICD-10-CM

## 2019-02-28 DIAGNOSIS — Z8679 Personal history of other diseases of the circulatory system: Secondary | ICD-10-CM | POA: Diagnosis not present

## 2019-02-28 MED ORDER — SULFAMETHOXAZOLE-TRIMETHOPRIM 800-160 MG PO TABS: 1.0000 | ORAL_TABLET | Freq: Two times a day (BID) | ORAL | 0 refills | Status: DC

## 2019-02-28 MED ORDER — JARDIANCE 25 MG PO TABS: 25.0000 mg | ORAL_TABLET | Freq: Every day | ORAL | 3 refills | Status: DC

## 2019-02-28 NOTE — Progress Notes (Signed)
Subjective:    Patient ID: [name] [name], female    DOB: [birth year], 68 y.o.   MRN: [id number]  HPI Patient is here today for a follow-up of her chronic medical problems.  Past medical history is significant for type 2 diabetes mellitus.  Her most recent hemoglobin A1c was mildly elevated at 6.9.  She is currently taking metformin.  She denies any diarrhea.  She denies any polyuria, polydipsia, or blurry vision.  She denies any neuropathy in her feet although she does suffer from restless leg syndrome for which she takes Requip at night.  She also has a remote history of coronary artery disease status post angioplasty.  She denies any chest pain, shortness of breath, dyspnea on exertion.  She denies any angina.  She denies any myalgias or right upper quadrant pain on her statin.  Her blood pressure today is extremely high.  She has not been checking her blood pressure at home.  She has been having a a difficult time caring for her husband recently.  His health continues to deteriorate.  He has had a nonhealing wound on his right leg and has been going back and forth to wound clinics for several years.  He is in constant pain.  He also has a history of a stroke with right-sided hemiparesis.  He is requiring more more care.  He wants her around him 24/7.  She never gets a break.  He starting to demonstrate some mild memory impairment and occasional delirium which I believe is partly due to his chronic pain medication which he receives through the [place].  Recently he was in the hospital due to falls and deconditioning.  [facility] however she needs assistance caring for him. Past Medical History:  Diagnosis Date  . Anxiety   . Arthritis   . CAD (coronary artery disease)   . Chronic kidney disease    Renal insufficiency  . COPD (chronic obstructive pulmonary disease) ([place])   . Depression   . Diabetes mellitus   . Diverticulosis of colon (without mention of  hemorrhage)   . Duodenitis without mention of hemorrhage   . GERD (gastroesophageal reflux disease)   . Hemorrhoids   . Hypercholesteremia   . Hypertension   . Insomnia   . Tinnitus   . Vertigo    Past Surgical History:  Procedure Laterality Date  . ANGIOPLASTY    . CHOLECYSTECTOMY    . ROTATOR CUFF REPAIR Right 04/2012  . TUBAL LIGATION     Current Outpatient Medications on File Prior to Visit  Medication Sig Dispense Refill  . albuterol (PROVENTIL HFA;VENTOLIN HFA) 108 (90 Base) MCG/ACT inhaler Inhale 2 puffs into the lungs every 4 (four) hours as needed for wheezing or shortness of breath. 3 Inhaler 1  . atorvastatin (LIPITOR) 20 MG tablet Take 1 tablet (20 mg total) by mouth daily. 90 tablet 3  . bimatoprost (LUMIGAN) 0.03 % ophthalmic solution Place 1 drop into both eyes at bedtime.     . cyclobenzaprine (FLEXERIL) 10 MG tablet Take 1 tablet (10 mg total) by mouth 3 (three) times daily as needed for muscle spasms. 270 tablet 2  . dexlansoprazole (DEXILANT) 60 MG capsule Take 1 capsule (60 mg total) by mouth daily. 90 capsule 3  . diltiazem (CARDIZEM) 120 MG tablet Take 1 tablet (120 mg total) by mouth daily. 90 tablet 3  . doxazosin (CARDURA) 4 MG tablet Take 0.5 tablets (2 mg total) by mouth  daily. 90 tablet 1  . glucose blood (FREESTYLE LITE) test strip Pt checks BS bid - also needs lancets disp: 1 box with prn refills 100 each 11  . hydrochlorothiazide (HYDRODIURIL) 25 MG tablet Take 1 tablet (25 mg total) by mouth daily. 90 tablet 1  . HYDROcodone-acetaminophen (NORCO) 10-325 MG per tablet Take 1 tablet by mouth every 6 (six) hours as needed for moderate pain.     Marland Kitchen ipratropium-albuterol (DUONEB) 0.5-2.5 (3) MG/3ML SOLN INHALE 1 VIAL VIA NEBULIZER EVERY 6 HOURS AS NEEDED 360 mL 0  . losartan (COZAAR) 100 MG tablet Take 1 tablet (100 mg total) by mouth daily. (Patient taking differently: Take 50 mg by mouth daily. ) 90 tablet 3  . metFORMIN (GLUCOPHAGE) 1000 MG tablet Take 1  tablet (1,000 mg total) by mouth 2 (two) times daily with a meal. 180 tablet 3  . neomycin-polymyxin-hydrocortisone (CORTISPORIN) OTIC solution Apply 1-2 drops to toe after soaking BID 10 mL 1  . OXYGEN Inhale 2 L into the lungs continuous.    Marland Kitchen oxymorphone (OPANA ER) 20 MG 12 hr tablet Take 20 mg by mouth every 12 (twelve) hours as needed for pain.     Marland Kitchen rOPINIRole (REQUIP) 2 MG tablet TAKE 1 TABLET BY MOUTH THREE TIMES A DAY 90 tablet 5  . tiotropium (SPIRIVA HANDIHALER) 18 MCG inhalation capsule Place 1 capsule (18 mcg total) into inhaler and inhale daily. 90 capsule 3  . zolpidem (AMBIEN) 10 MG tablet Take 10 mg by mouth at bedtime as needed for sleep.     No current facility-administered medications on file prior to visit.    Allergies  Allergen Reactions  . Guaifenesin & Derivatives     Severe Reaction  . Benazepril Other (See Comments)    cough  . Latex     REACTION: powder on gloves  . Morphine     REACTION: nausea  . Zyrtec [Cetirizine] Other (See Comments)    swelling   Social History   Socioeconomic History  . Marital status: Married    Spouse name: Not on file  . Number of children: 1  . Years of education: Not on file  . Highest education level: Not on file  Occupational History  . Occupation: Retired  Scientific laboratory technician  . Financial resource strain: Not on file  . Food insecurity    Worry: Not on file    Inability: Not on file  . Transportation needs    Medical: Not on file    Non-medical: Not on file  Tobacco Use  . Smoking status: Former Smoker    Types: Cigarettes    Quit date: 08/12/2018    Years since quitting: 0.5  . Smokeless tobacco: Never Used  Substance and Sexual Activity  . Alcohol use: No    Alcohol/week: 0.0 standard drinks  . Drug use: No  . Sexual activity: Not on file  Lifestyle  . Physical activity    Days per week: Not on file    Minutes per session: Not on file  . Stress: Not on file  Relationships  . Social Herbalist on  phone: Not on file    Gets together: Not on file    Attends religious service: Not on file    Active member of club or organization: Not on file    Attends meetings of clubs or organizations: Not on file    Relationship status: Not on file  . Intimate partner violence    Fear of  current or ex partner: Not on file    Emotionally abused: Not on file    Physically abused: Not on file    Forced sexual activity: Not on file  Other Topics Concern  . Not on file  Social History Narrative  . Not on file      Review of Systems  All other systems reviewed and are negative.      Objective:   Physical Exam Vitals signs reviewed.  Constitutional:      General: She is not in acute distress.    Appearance: Normal appearance. She is normal weight. She is not ill-appearing, toxic-appearing or diaphoretic.  Cardiovascular:     Rate and Rhythm: Normal rate and regular rhythm.     Heart sounds: Normal heart sounds. No murmur. No friction rub. No gallop.   Pulmonary:     Effort: Pulmonary effort is normal. No respiratory distress.     Breath sounds: Normal breath sounds. No stridor. No wheezing or rhonchi.  Abdominal:     General: Abdomen is flat. Bowel sounds are normal.     Palpations: Abdomen is soft.  Musculoskeletal:     Right lower leg: No edema.     Left lower leg: No edema.  Neurological:     Mental Status: She is alert.           Assessment & Plan:  1. Controlled type 2 diabetes mellitus without complication, without long-term current use of insulin ([place]) Diabetic control could be better.  Given her history of coronary artery disease, I believe she would benefit from adding Jardiance 25 mg a day to her metformin.  This could also facilitate weight loss, reduce her cardiovascular risk, reduce her risk of cardiovascular mortality, and possibly help her blood pressure.  Reassess lab work in 3 months  2. Pure hypercholesterolemia LDL cholesterol is well below 70.  This is the  goal set forth due to her history of coronary artery disease.  No changes are necessary at this time 3. History of coronary artery disease Patient is taking an aspirin.  She is quit smoking.  LDL is well below 70.  Add Jardiance to better control her diabetes and reduce her cardiovascular risk.  4. Essential hypertension Blood pressures terrible.  Patient will check her blood pressure twice a day and notify me of the values in 1 week.  If consistently greater than 140/90, I would increase her Cardura to 4 mg a day.  Addendum, add Bactrim double strength tablets 1 twice daily for possible cellulitis of the right great toe.  Please see diabetic foot exam

## 2019-03-08 ENCOUNTER — Other Ambulatory Visit: Payer: Self-pay | Admitting: Family Medicine

## 2019-03-08 ENCOUNTER — Telehealth: Payer: Self-pay | Admitting: Family Medicine

## 2019-03-08 MED ORDER — DIAZEPAM 5 MG PO TABS: 5.0000 mg | ORAL_TABLET | Freq: Two times a day (BID) | ORAL | 1 refills | Status: DC | PRN

## 2019-03-08 NOTE — Telephone Encounter (Signed)
I am so sorry.  I will definitely call out something for her nerves. I will send in valium.

## 2019-03-08 NOTE — Telephone Encounter (Signed)
Pt's husband passed away yesterday and would like something called in for her nerves.

## 2019-03-09 NOTE — Telephone Encounter (Signed)
Pt aware.

## 2019-03-21 ENCOUNTER — Ambulatory Visit (HOSPITAL_COMMUNITY)
Admission: RE | Admit: 2019-03-21 | Discharge: 2019-03-21 | Disposition: A | Discharge status: Home / Self Care | Payer: Medicare Other | Source: Ambulatory Visit | Attending: Family Medicine | Admitting: Family Medicine

## 2019-03-21 ENCOUNTER — Other Ambulatory Visit: Payer: Self-pay

## 2019-03-21 DIAGNOSIS — Z1231 Encounter for screening mammogram for malignant neoplasm of breast: Secondary | ICD-10-CM | POA: Insufficient documentation

## 2019-03-24 ENCOUNTER — Other Ambulatory Visit: Payer: Self-pay

## 2019-03-24 ENCOUNTER — Ambulatory Visit (INDEPENDENT_AMBULATORY_CARE_PROVIDER_SITE_OTHER): Payer: Medicare Other | Admitting: Family Medicine

## 2019-03-24 DIAGNOSIS — J019 Acute sinusitis, unspecified: Secondary | ICD-10-CM | POA: Diagnosis not present

## 2019-03-24 DIAGNOSIS — B9689 Other specified bacterial agents as the cause of diseases classified elsewhere: Secondary | ICD-10-CM

## 2019-03-24 MED ORDER — PREDNISONE 20 MG PO TABS: ORAL_TABLET | ORAL | 0 refills | Status: DC

## 2019-03-24 MED ORDER — CEFDINIR 300 MG PO CAPS: 300.0000 mg | ORAL_CAPSULE | Freq: Two times a day (BID) | ORAL | 0 refills | Status: DC

## 2019-03-24 NOTE — Progress Notes (Signed)
Subjective:    Patient ID: [name] [name], female    DOB: 12-02-1950, 68 y.o.   MRN: [id number]  HPI Patient is being seen today as a telephone visit.  Phone call began at 420.  Phone call ended at 439.  Patient consents to be seen by telephone.  She states that for the last 2 weeks she has had head congestion.  She reports rhinorrhea.  She reports pain and pressure in her frontal and maxillary sinuses.  She is tried Surveyor, mining.  She is tried Afrin.  None of these are helping her symptoms.  In fact she is been on Afrin for more than a week possibly contributing some to the nasal congestion and stuffy nose.  She also reports increasing shortness of breath with activity.  She has a history of COPD however recently she reports more cough and more wheezing.  She takes Spiriva once a day.  In the past she took Symbicort which seems to really help her breathing however she had to discontinue the medication due to the fact it was impacting her glaucoma.  Unfortunately her shortness of breath is worsening.  She denies any chest pain.  She denies any purulent sputum.  She denies any hemoptysis Past Medical History:  Diagnosis Date  . Anxiety   . Arthritis   . CAD (coronary artery disease)   . Chronic kidney disease    Renal insufficiency  . COPD (chronic obstructive pulmonary disease) ([place])   . Depression   . Diabetes mellitus   . Diverticulosis of colon (without mention of hemorrhage)   . Duodenitis without mention of hemorrhage   . GERD (gastroesophageal reflux disease)   . Hemorrhoids   . Hypercholesteremia   . Hypertension   . Insomnia   . Tinnitus   . Vertigo    Past Surgical History:  Procedure Laterality Date  . ANGIOPLASTY    . CHOLECYSTECTOMY    . ROTATOR CUFF REPAIR Right 04/2012  . TUBAL LIGATION     Current Outpatient Medications on File Prior to Visit  Medication Sig Dispense Refill  . albuterol (PROVENTIL HFA;VENTOLIN HFA) 108 (90 Base) MCG/ACT inhaler Inhale 2 puffs into  the lungs every 4 (four) hours as needed for wheezing or shortness of breath. 3 Inhaler 1  . atorvastatin (LIPITOR) 20 MG tablet Take 1 tablet (20 mg total) by mouth daily. 90 tablet 3  . bimatoprost (LUMIGAN) 0.03 % ophthalmic solution Place 1 drop into both eyes at bedtime.     . cyclobenzaprine (FLEXERIL) 10 MG tablet Take 1 tablet (10 mg total) by mouth 3 (three) times daily as needed for muscle spasms. 270 tablet 2  . dexlansoprazole (DEXILANT) 60 MG capsule Take 1 capsule (60 mg total) by mouth daily. 90 capsule 3  . diazepam (VALIUM) 5 MG tablet Take 1 tablet (5 mg total) by mouth every 12 (twelve) hours as needed for anxiety. 30 tablet 1  . diltiazem (CARDIZEM) 120 MG tablet Take 1 tablet (120 mg total) by mouth daily. 90 tablet 3  . doxazosin (CARDURA) 4 MG tablet Take 0.5 tablets (2 mg total) by mouth daily. 90 tablet 1  . empagliflozin (JARDIANCE) 25 MG TABS tablet Take 25 mg by mouth daily. 90 tablet 3  . glucose blood (FREESTYLE LITE) test strip Pt checks BS bid - also needs lancets disp: 1 box with prn refills 100 each 11  . hydrochlorothiazide (HYDRODIURIL) 25 MG tablet Take 1 tablet (25 mg total) by mouth daily. 90 tablet 1  .  HYDROcodone-acetaminophen (NORCO) 10-325 MG per tablet Take 1 tablet by mouth every 6 (six) hours as needed for moderate pain.     Marland Kitchen ipratropium-albuterol (DUONEB) 0.5-2.5 (3) MG/3ML SOLN INHALE 1 VIAL VIA NEBULIZER EVERY 6 HOURS AS NEEDED 360 mL 0  . losartan (COZAAR) 100 MG tablet Take 1 tablet (100 mg total) by mouth daily. (Patient taking differently: Take 50 mg by mouth daily. ) 90 tablet 3  . metFORMIN (GLUCOPHAGE) 1000 MG tablet Take 1 tablet (1,000 mg total) by mouth 2 (two) times daily with a meal. 180 tablet 3  . neomycin-polymyxin-hydrocortisone (CORTISPORIN) OTIC solution Apply 1-2 drops to toe after soaking BID 10 mL 1  . OXYGEN Inhale 2 L into the lungs continuous.    Marland Kitchen oxymorphone (OPANA ER) 20 MG 12 hr tablet Take 20 mg by mouth every 12  (twelve) hours as needed for pain.     Marland Kitchen rOPINIRole (REQUIP) 2 MG tablet TAKE 1 TABLET BY MOUTH THREE TIMES A DAY 90 tablet 5  . sulfamethoxazole-trimethoprim (BACTRIM DS) 800-160 MG tablet Take 1 tablet by mouth 2 (two) times daily. 14 tablet 0  . tiotropium (SPIRIVA HANDIHALER) 18 MCG inhalation capsule Place 1 capsule (18 mcg total) into inhaler and inhale daily. 90 capsule 3  . zolpidem (AMBIEN) 10 MG tablet Take 10 mg by mouth at bedtime as needed for sleep.     No current facility-administered medications on file prior to visit.    Allergies  Allergen Reactions  . Guaifenesin & Derivatives     Severe Reaction  . Benazepril Other (See Comments)    cough  . Latex     REACTION: powder on gloves  . Morphine     REACTION: nausea  . Zyrtec [Cetirizine] Other (See Comments)    swelling   Social History   Socioeconomic History  . Marital status: Married    Spouse name: Not on file  . Number of children: 1  . Years of education: Not on file  . Highest education level: Not on file  Occupational History  . Occupation: Retired  Scientific laboratory technician  . Financial resource strain: Not on file  . Food insecurity    Worry: Not on file    Inability: Not on file  . Transportation needs    Medical: Not on file    Non-medical: Not on file  Tobacco Use  . Smoking status: Former Smoker    Types: Cigarettes    Quit date: 08/12/2018    Years since quitting: 0.6  . Smokeless tobacco: Never Used  Substance and Sexual Activity  . Alcohol use: No    Alcohol/week: 0.0 standard drinks  . Drug use: No  . Sexual activity: Not on file  Lifestyle  . Physical activity    Days per week: Not on file    Minutes per session: Not on file  . Stress: Not on file  Relationships  . Social Herbalist on phone: Not on file    Gets together: Not on file    Attends religious service: Not on file    Active member of club or organization: Not on file    Attends meetings of clubs or organizations: Not  on file    Relationship status: Not on file  . Intimate partner violence    Fear of current or ex partner: Not on file    Emotionally abused: Not on file    Physically abused: Not on file    Forced sexual activity: Not  on file  Other Topics Concern  . Not on file  Social History Narrative  . Not on file      Review of Systems  All other systems reviewed and are negative.      Objective:   Physical Exam  Patient is speaking full and complete sentences over the telephone with no respiratory distress.  She is not coughing.  She is not audibly wheezing. I am unable to perform a physical exam due to the fact this is a telephone encounter      Assessment & Plan:  The encounter diagnosis was Acute bacterial rhinosinusitis. 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  Recommended discontinuation of Afrin.  Will start patient on a prednisone taper pack due to the swelling in her nasal passages and possibly an underlying COPD exacerbation and treat the sinus infection with Omnicef 300 mg p.o. twice daily for the 10 days.  I also recommended discontinuation of Spiriva and switching to Stiolto 2 inhalations daily and then reassess via telephone next week.  If her dyspnea on exertion continues to worsen, consider an echocardiogram given her underlying cardiac history.  Also obtain a chest x-ray if the shortness of breath worsens to rule out underlying pneumonia.  Also gave the patient my condolences over her husband's recent death.  Unfortunately he recently committed suicide.  My heart breaks for this individual.

## 2019-04-25 ENCOUNTER — Other Ambulatory Visit: Payer: Self-pay

## 2019-04-26 ENCOUNTER — Ambulatory Visit (INDEPENDENT_AMBULATORY_CARE_PROVIDER_SITE_OTHER): Payer: Medicare Other | Admitting: Family Medicine

## 2019-04-26 ENCOUNTER — Encounter: Payer: Self-pay | Admitting: Family Medicine

## 2019-04-26 VITALS — BP 162/60 | HR 82 | Temp 98.0°F | Resp 18 | Ht 64.0 in | Wt 189.0 lb

## 2019-04-26 DIAGNOSIS — E7800 Pure hypercholesterolemia, unspecified: Secondary | ICD-10-CM

## 2019-04-26 DIAGNOSIS — E119 Type 2 diabetes mellitus without complications: Secondary | ICD-10-CM | POA: Diagnosis not present

## 2019-04-26 DIAGNOSIS — I10 Essential (primary) hypertension: Secondary | ICD-10-CM | POA: Diagnosis not present

## 2019-04-26 DIAGNOSIS — J449 Chronic obstructive pulmonary disease, unspecified: Secondary | ICD-10-CM | POA: Diagnosis not present

## 2019-04-26 MED ORDER — POLYMYXIN B-TRIMETHOPRIM 10000-0.1 UNIT/ML-% OP SOLN: 2.0000 [drp] | OPHTHALMIC | 0 refills | Status: DC

## 2019-04-26 NOTE — Progress Notes (Signed)
Subjective:    Patient ID: [name] [name], female    DOB: 1950/08/18, 67 y.o.   MRN: [id number]  Medication Refill  Dizziness   Wt Readings from Last 3 Encounters:  04/26/19 189 lb (85.7 kg)  02/28/19 192 lb (87.1 kg)  10/25/18 181 lb (82.1 kg)  Recently, the patient reports orthostatic dizziness.  She states whenever she stands up she will feel extremely lightheaded and dizzy.  The room will not spin.  She denies any vertigo.  She denies any syncope or near syncope however she will feel tremulous like her arms begin to shake.  She will have to sit down because she feels like her legs are going to give out.  The symptoms last less than a minute and resolve.  They are always associated with standing up.  It sounds like she may be experiencing orthostatic hypotension.  She is concerned that it may be her nerves.  Her husband recently committed suicide and she is obviously been dealing with terrible grief related to this.  She has been having some panic attacks however during these events she denies any chest pain or shortness of breath.  She denies any palpitations or tachycardia or irregular heart rhythms.  And again it is always associated with standing and changes in position.  She is on Requip which can cause orthostatic dizziness.  She is also on Cardura which can cause orthostatic dizziness.  She also admits that she is not drinking or eating very much and she is on several medicines which can cause dehydration including Jardiance and hydrochlorothiazide.  Past Medical History:  Diagnosis Date   Anxiety    Arthritis    CAD (coronary artery disease)    Chronic kidney disease    Renal insufficiency   COPD (chronic obstructive pulmonary disease) (HCC)    Depression    Diabetes mellitus    Diverticulosis of colon (without mention of hemorrhage)    Duodenitis without mention of hemorrhage    GERD (gastroesophageal reflux disease)    Hemorrhoids    Hypercholesteremia     Hypertension    Insomnia    Tinnitus    Vertigo    Past Surgical History:  Procedure Laterality Date   ANGIOPLASTY     CHOLECYSTECTOMY     ROTATOR CUFF REPAIR Right 04/2012   TUBAL LIGATION     Current Outpatient Medications on File Prior to Visit  Medication Sig Dispense Refill   albuterol (PROVENTIL HFA;VENTOLIN HFA) 108 (90 Base) MCG/ACT inhaler Inhale 2 puffs into the lungs every 4 (four) hours as needed for wheezing or shortness of breath. 3 Inhaler 1   atorvastatin (LIPITOR) 20 MG tablet Take 1 tablet (20 mg total) by mouth daily. 90 tablet 3   bimatoprost (LUMIGAN) 0.03 % ophthalmic solution Place 1 drop into both eyes at bedtime.      cyclobenzaprine (FLEXERIL) 10 MG tablet Take 1 tablet (10 mg total) by mouth 3 (three) times daily as needed for muscle spasms. 270 tablet 2   dexlansoprazole (DEXILANT) 60 MG capsule Take 1 capsule (60 mg total) by mouth daily. 90 capsule 3   diazepam (VALIUM) 5 MG tablet Take 1 tablet (5 mg total) by mouth every 12 (twelve) hours as needed for anxiety. 30 tablet 1   diltiazem (CARDIZEM) 120 MG tablet Take 1 tablet (120 mg total) by mouth daily. 90 tablet 3   doxazosin (CARDURA) 4 MG tablet Take 0.5 tablets (2 mg total) by mouth daily. 90 tablet 1  empagliflozin (JARDIANCE) 25 MG TABS tablet Take 25 mg by mouth daily. 90 tablet 3   hydrochlorothiazide (HYDRODIURIL) 25 MG tablet Take 1 tablet (25 mg total) by mouth daily. 90 tablet 1   HYDROcodone-acetaminophen (NORCO) 10-325 MG per tablet Take 1 tablet by mouth every 6 (six) hours as needed for moderate pain.      ipratropium-albuterol (DUONEB) 0.5-2.5 (3) MG/3ML SOLN INHALE 1 VIAL VIA NEBULIZER EVERY 6 HOURS AS NEEDED 360 mL 0   losartan (COZAAR) 100 MG tablet Take 1 tablet (100 mg total) by mouth daily. (Patient taking differently: Take 50 mg by mouth daily. ) 90 tablet 3   neomycin-polymyxin-hydrocortisone (CORTISPORIN) OTIC solution Apply 1-2 drops to toe after soaking BID  10 mL 1   OXYGEN Inhale 2 L into the lungs continuous.     oxymorphone (OPANA ER) 20 MG 12 hr tablet Take 20 mg by mouth every 12 (twelve) hours as needed for pain.      rOPINIRole (REQUIP) 2 MG tablet TAKE 1 TABLET BY MOUTH THREE TIMES A DAY 90 tablet 5   tiotropium (SPIRIVA HANDIHALER) 18 MCG inhalation capsule Place 1 capsule (18 mcg total) into inhaler and inhale daily. 90 capsule 3   zolpidem (AMBIEN) 10 MG tablet Take 10 mg by mouth at bedtime as needed for sleep.     No current facility-administered medications on file prior to visit.    Allergies  Allergen Reactions   Guaifenesin & Derivatives     Severe Reaction   Benazepril Other (See Comments)    cough   Latex     REACTION: powder on gloves   Morphine     REACTION: nausea   Zyrtec [Cetirizine] Other (See Comments)    swelling   Social History   Socioeconomic History   Marital status: Married    Spouse name: Not on file   Number of children: 1   Years of education: Not on file   Highest education level: Not on file  Occupational History   Occupation: Retired  Scientist, product/process development strain: Not on file   Food insecurity    Worry: Not on file    Inability: Not on Lexicographer needs    Medical: Not on file    Non-medical: Not on file  Tobacco Use   Smoking status: Former Smoker    Types: Cigarettes    Quit date: 08/12/2018    Years since quitting: 0.7   Smokeless tobacco: Never Used  Substance and Sexual Activity   Alcohol use: No    Alcohol/week: 0.0 standard drinks   Drug use: No   Sexual activity: Not on file  Lifestyle   Physical activity    Days per week: Not on file    Minutes per session: Not on file   Stress: Not on file  Relationships   Social connections    Talks on phone: Not on file    Gets together: Not on file    Attends religious service: Not on file    Active member of club or organization: Not on file    Attends meetings of clubs or  organizations: Not on file    Relationship status: Not on file   Intimate partner violence    Fear of current or ex partner: Not on file    Emotionally abused: Not on file    Physically abused: Not on file    Forced sexual activity: Not on file  Other Topics Concern   Not  on file  Social History Narrative   Not on file   Family History  Problem Relation Age of Onset   Diabetes Mother    Hemophilia Mother    Breast cancer Sister    Heart disease Father    Colon polyps Father    Diabetes Brother    Colon polyps Sister       Review of Systems  Neurological: Positive for dizziness.  All other systems reviewed and are negative.      Objective:   Physical Exam  Constitutional: She is oriented to person, place, and time. She appears well-developed and well-nourished. No distress.  Neck: Neck supple. No JVD present. No thyromegaly present.  Cardiovascular: Normal rate, regular rhythm and intact distal pulses.  Murmur heard. Pulmonary/Chest: Effort normal and breath sounds normal. No respiratory distress. She has no wheezes. She has no rales.  Abdominal: Soft. Bowel sounds are normal. She exhibits no distension. There is no abdominal tenderness. There is no rebound and no guarding.  Musculoskeletal:        General: No edema.     Right lower leg: She exhibits no tenderness, no swelling and no deformity. No edema.  Lymphadenopathy:    She has no cervical adenopathy.  Neurological: She is alert and oriented to person, place, and time. She exhibits normal muscle tone. Coordination normal.  Skin: She is not diaphoretic.  Vitals reviewed.         Assessment & Plan:  Controlled type 2 diabetes mellitus without complication, without long-term current use of insulin ([facility]) - Plan: Hemoglobin A1c, CBC with Differential/Platelet, COMPLETE METABOLIC PANEL WITH GFR, Lipid panel, Microalbumin, urine  Pure hypercholesterolemia  Essential hypertension  Chronic obstructive  pulmonary disease, unspecified COPD type ([place])  I believe the patient is experiencing orthostatic hypotension.  She has been checking her blood pressures at home and her blood pressures have been in the 130s over 60-70.  These are well controlled.  Her blood pressure here is elevated however she states that she has been checking it regularly at home.  I believe that she is experiencing orthostatic hypotension most likely related to a combination of dehydration and polypharmacy particularly her pain medication, her Requip, and her Flexeril.  She states that she is not using Valium at all.  I have recommended temporarily weaning off the Requip and trying to push fluids to help counter dehydration.  Meanwhile I will check a CBC, CMP, fasting lipid panel, hemoglobin A1c, and urine microalbumin.  Her reported blood pressures at home are well controlled.  Her goal LDL cholesterol is less than 70.  Ideally I like to see her A1c less than 6.5.  The patient will call me back Friday and give me an update on if the orthostatic dizziness has improved after holding the Requip and pushing fluids.  If so we may have to make a decision about whether the medicine is worth resuming depending upon the severity of her restless legs.  I have also been encouraged compression hose which can help combat orthostatic hypotension.  Patient also received her flu shot today.  She also suffered a corneal abrasion in her right eye when she tore a contact lens.  I see no foreign body in the right eye today despite close inspection.  However her symptoms of a foreign body sensation sound like a corneal abrasion.  I will treat that with Polytrim 2 drops every 2 hours for the next 2 days and see if symptoms improve

## 2019-04-27 ENCOUNTER — Other Ambulatory Visit: Payer: Self-pay | Admitting: Family Medicine

## 2019-04-27 LAB — CBC WITH DIFFERENTIAL/PLATELET
Absolute Monocytes: 672 cells/uL (ref 200–950)
Basophils Absolute: 58 cells/uL (ref 0–200)
Basophils Relative: 0.8 %
Eosinophils Absolute: 263 cells/uL (ref 15–500)
Eosinophils Relative: 3.6 %
HCT: 39.1 % (ref 35.0–45.0)
Hemoglobin: 13 g/dL (ref 11.7–15.5)
Lymphs Abs: 2628 cells/uL (ref 850–3900)
MCH: 30.7 pg (ref 27.0–33.0)
MCHC: 33.2 g/dL (ref 32.0–36.0)
MCV: 92.4 fL (ref 80.0–100.0)
MPV: 9.8 fL (ref 7.5–12.5)
Monocytes Relative: 9.2 %
Neutro Abs: 3679 cells/uL (ref 1500–7800)
Neutrophils Relative %: 50.4 %
Platelets: 363 10*3/uL (ref 140–400)
RBC: 4.23 10*6/uL (ref 3.80–5.10)
RDW: 12.8 % (ref 11.0–15.0)
Total Lymphocyte: 36 %
WBC: 7.3 10*3/uL (ref 3.8–10.8)

## 2019-04-27 LAB — LIPID PANEL
Cholesterol: 167 mg/dL (ref <200)
HDL: 44 mg/dL — ABNORMAL LOW (ref >=50)
LDL Cholesterol (Calc): 98 mg/dL (calc)
Non-HDL Cholesterol (Calc): 123 mg/dL (calc) (ref <130)
Total CHOL/HDL Ratio: 3.8 (calc) (ref <5.0)
Triglycerides: 148 mg/dL (ref <150)

## 2019-04-27 LAB — COMPLETE METABOLIC PANEL WITH GFR
AG Ratio: 1.2 (calc) (ref 1.0–2.5)
ALT: 28 U/L (ref 6–29)
AST: 25 U/L (ref 10–35)
Albumin: 3.8 g/dL (ref 3.6–5.1)
Alkaline phosphatase (APISO): 43 U/L (ref 37–153)
BUN/Creatinine Ratio: 13 (calc) (ref 6–22)
BUN: 14 mg/dL (ref 7–25)
CO2: 27 mmol/L (ref 20–32)
Calcium: 9.9 mg/dL (ref 8.6–10.4)
Chloride: 100 mmol/L (ref 98–110)
Creat: 1.09 mg/dL — ABNORMAL HIGH (ref 0.50–0.99)
GFR, Est African American: 60 mL/min/{1.73_m2} (ref >=60)
GFR, Est Non African American: 52 mL/min/{1.73_m2} — ABNORMAL LOW (ref >=60)
Globulin: 3.1 g/dL (calc) (ref 1.9–3.7)
Glucose, Bld: 146 mg/dL — ABNORMAL HIGH (ref 65–99)
Potassium: 4 mmol/L (ref 3.5–5.3)
Sodium: 138 mmol/L (ref 135–146)
Total Bilirubin: 0.3 mg/dL (ref 0.2–1.2)
Total Protein: 6.9 g/dL (ref 6.1–8.1)

## 2019-04-27 LAB — HEMOGLOBIN A1C
Hgb A1c MFr Bld: 7 % of total Hgb — ABNORMAL HIGH (ref <5.7)
Mean Plasma Glucose: 154 (calc)
eAG (mmol/L): 8.5 (calc)

## 2019-04-27 LAB — MICROALBUMIN, URINE: Microalb, Ur: 0.3 mg/dL

## 2019-04-27 MED ORDER — DOXAZOSIN MESYLATE 4 MG PO TABS: 2.0000 mg | ORAL_TABLET | Freq: Every day | ORAL | 3 refills | Status: DC

## 2019-04-27 MED ORDER — JARDIANCE 25 MG PO TABS: 25.0000 mg | ORAL_TABLET | Freq: Every day | ORAL | 3 refills | Status: DC

## 2019-04-27 MED ORDER — FREESTYLE LIBRE 14 DAY SENSOR MISC: 1.0000 | 3 refills | Status: DC

## 2019-04-27 MED ORDER — ROPINIROLE HCL 2 MG PO TABS: 2.0000 mg | ORAL_TABLET | Freq: Three times a day (TID) | ORAL | 3 refills | Status: AC

## 2019-04-27 MED ORDER — DEXILANT 60 MG PO CPDR: 60.0000 mg | DELAYED_RELEASE_CAPSULE | Freq: Every day | ORAL | 3 refills | Status: DC

## 2019-04-27 MED ORDER — DILTIAZEM HCL 120 MG PO TABS: 120.0000 mg | ORAL_TABLET | Freq: Every day | ORAL | 3 refills | Status: DC

## 2019-04-27 MED ORDER — CYCLOBENZAPRINE HCL 10 MG PO TABS: 10.0000 mg | ORAL_TABLET | Freq: Three times a day (TID) | ORAL | 2 refills | Status: DC | PRN

## 2019-04-27 MED ORDER — LOSARTAN POTASSIUM 100 MG PO TABS: 100.0000 mg | ORAL_TABLET | Freq: Every day | ORAL | 3 refills | Status: DC

## 2019-04-27 NOTE — Telephone Encounter (Signed)
Requesting refill    Ambien (would like to get this by mail)   LOV: 04/26/19  LRF:  12/03/18

## 2019-04-27 NOTE — Addendum Note (Signed)
Addended by: Shary Decamp B on: 04/27/2019 09:36 AM   Modules accepted: Orders

## 2019-04-28 ENCOUNTER — Other Ambulatory Visit: Payer: Self-pay | Admitting: Family Medicine

## 2019-04-28 MED ORDER — ZOLPIDEM TARTRATE 10 MG PO TABS: 10.0000 mg | ORAL_TABLET | Freq: Every evening | ORAL | 1 refills | Status: DC | PRN

## 2019-04-29 ENCOUNTER — Other Ambulatory Visit: Payer: Self-pay | Admitting: Family Medicine

## 2019-04-29 MED ORDER — ATORVASTATIN CALCIUM 20 MG PO TABS: 20.0000 mg | ORAL_TABLET | Freq: Every day | ORAL | 3 refills | Status: DC

## 2019-05-23 ENCOUNTER — Other Ambulatory Visit: Payer: Self-pay

## 2019-05-23 ENCOUNTER — Encounter: Payer: Self-pay | Admitting: Family Medicine

## 2019-05-23 ENCOUNTER — Ambulatory Visit (INDEPENDENT_AMBULATORY_CARE_PROVIDER_SITE_OTHER): Payer: Medicare Other | Admitting: Family Medicine

## 2019-05-23 VITALS — BP 140/52 | HR 86 | Temp 97.6°F | Ht 64.0 in | Wt 189.0 lb

## 2019-05-23 DIAGNOSIS — M65311 Trigger thumb, right thumb: Secondary | ICD-10-CM | POA: Diagnosis not present

## 2019-05-23 DIAGNOSIS — E559 Vitamin D deficiency, unspecified: Secondary | ICD-10-CM | POA: Diagnosis not present

## 2019-05-23 DIAGNOSIS — R42 Dizziness and giddiness: Secondary | ICD-10-CM

## 2019-05-23 DIAGNOSIS — G5601 Carpal tunnel syndrome, right upper limb: Secondary | ICD-10-CM

## 2019-05-23 DIAGNOSIS — F5101 Primary insomnia: Secondary | ICD-10-CM | POA: Diagnosis not present

## 2019-05-23 DIAGNOSIS — E538 Deficiency of other specified B group vitamins: Secondary | ICD-10-CM | POA: Diagnosis not present

## 2019-05-23 DIAGNOSIS — F321 Major depressive disorder, single episode, moderate: Secondary | ICD-10-CM | POA: Diagnosis not present

## 2019-05-23 LAB — VITAMIN B12: Vitamin B-12: 399 pg/mL (ref 200–1100)

## 2019-05-23 LAB — VITAMIN D 25 HYDROXY (VIT D DEFICIENCY, FRACTURES): Vit D, 25-Hydroxy: 16 ng/mL — ABNORMAL LOW (ref 30–100)

## 2019-05-23 MED ORDER — DIAZEPAM 5 MG PO TABS: 5.0000 mg | ORAL_TABLET | Freq: Two times a day (BID) | ORAL | 1 refills | Status: DC | PRN

## 2019-05-23 MED ORDER — ESCITALOPRAM OXALATE 10 MG PO TABS: 10.0000 mg | ORAL_TABLET | Freq: Every day | ORAL | 3 refills | Status: DC

## 2019-05-23 NOTE — Progress Notes (Signed)
Subjective:    Patient ID: [name] [name], female    DOB: 01-22-1951, 68 y.o.   MRN: [id number]  Dizziness  Medication Refill  04/26/19 Wt Readings from Last 3 Encounters:  05/23/19 189 lb (85.7 kg)  04/26/19 189 lb (85.7 kg)  02/28/19 192 lb (87.1 kg)  Recently, the patient reports orthostatic dizziness.  She states whenever she stands up she will feel extremely lightheaded and dizzy.  The room will not spin.  She denies any vertigo.  She denies any syncope or near syncope however she will feel tremulous like her arms begin to shake.  She will have to sit down because she feels like her legs are going to give out.  The symptoms last less than a minute and resolve.  They are always associated with standing up.  It sounds like she may be experiencing orthostatic hypotension.  She is concerned that it may be her nerves.  Her husband recently committed suicide and she is obviously been dealing with terrible grief related to this.  She has been having some panic attacks however during these events she denies any chest pain or shortness of breath.  She denies any palpitations or tachycardia or irregular heart rhythms.  And again it is always associated with standing and changes in position.  She is on Requip which can cause orthostatic dizziness.  She is also on Cardura which can cause orthostatic dizziness.  She also admits that she is not drinking or eating very much and she is on several medicines which can cause dehydration including Jardiance and hydrochlorothiazide.  At that time, my plan was: I believe the patient is experiencing orthostatic hypotension.  She has been checking her blood pressures at home and her blood pressures have been in the 130s over 60-70.  These are well controlled.  Her blood pressure here is elevated however she states that she has been checking it regularly at home.  I believe that she is experiencing orthostatic hypotension most likely related to a combination of  dehydration and polypharmacy particularly her pain medication, her Requip, and her Flexeril.  She states that she is not using Valium at all.  I have recommended temporarily weaning off the Requip and trying to push fluids to help counter dehydration.  Meanwhile I will check a CBC, CMP, fasting lipid panel, hemoglobin A1c, and urine microalbumin.  Her reported blood pressures at home are well controlled.  Her goal LDL cholesterol is less than 70.  Ideally I like to see her A1c less than 6.5.  The patient will call me back Friday and give me an update on if the orthostatic dizziness has improved after holding the Requip and pushing fluids.  If so we may have to make a decision about whether the medicine is worth resuming depending upon the severity of her restless legs.  I have also been encouraged compression hose which can help combat orthostatic hypotension.  Patient also received her flu shot today.  She also suffered a corneal abrasion in her right eye when she tore a contact lens.  I see no foreign body in the right eye today despite close inspection.  However her symptoms of a foreign body sensation sound like a corneal abrasion.  I will treat that with Polytrim 2 drops every 2 hours for the next 2 days and see if symptoms improve  05/23/19 Patient presents today with several concerns.  Recently, she started doing work on her home.  She was working over her head  having to scrape popcorn texture off the ceiling.  This caused pain in her right shoulder.  Pain was worse with abduction over her head.  Since that time she is also developed numbness in her hand.  The numbness is primarily distal to the wrist and occurs with repeated use of her hand.  She also reports a locking sensation in her right thumb.  If she flexes her thumb, it will get locked and she will physically have to extend her right thumb.  She denies any numbness or tingling radiating from her neck down her right arm.  She denies any slurred  speech or facial droop.  She denies any left-sided headache.  The numbness is limited to her right hand.  The locking is limited to her right thumb at the MCP joint.  She also reports severe insomnia.  Ambien allows her to go to sleep however she will wake up after just 1 or 2 hours and then she is unable to sleep through the rest of the evening.  She feels extremely tired.  Her anxiety is getting worse.  She believes that she was in shock after her husband died however now is the reality is sitting and she is battling depression.  She reports anhedonia and persistent sadness.  The anxiety and grief keeps her awake at night and the Ambien is not helping her to sleep.  She did discontinue Flexeril and Requip as I stated at her last visit however the dizziness has not improved.  Now the dizziness sounds more like orthostatic hypotension as she reports feeling lightheaded like she might pass out if she stands up or sits up quickly.  She denies any vertigo.  She denies any syncope. Past Medical History:  Diagnosis Date  . Anxiety   . Arthritis   . CAD (coronary artery disease)   . Chronic kidney disease    Renal insufficiency  . COPD (chronic obstructive pulmonary disease) ([place])   . Depression   . Diabetes mellitus   . Diverticulosis of colon (without mention of hemorrhage)   . Duodenitis without mention of hemorrhage   . GERD (gastroesophageal reflux disease)   . Hemorrhoids   . Hypercholesteremia   . Hypertension   . Insomnia   . Tinnitus   . Vertigo       Past Surgical History:  Procedure Laterality Date  . ANGIOPLASTY    . CHOLECYSTECTOMY    . ROTATOR CUFF REPAIR Right 04/2012  . TUBAL LIGATION     Current Outpatient Medications on File Prior to Visit  Medication Sig Dispense Refill  . albuterol (PROVENTIL HFA;VENTOLIN HFA) 108 (90 Base) MCG/ACT inhaler Inhale 2 puffs into the lungs every 4 (four) hours as needed for wheezing or shortness of breath. 3 Inhaler 1  . atorvastatin  (LIPITOR) 20 MG tablet Take 1 tablet (20 mg total) by mouth daily. 90 tablet 3  . bimatoprost (LUMIGAN) 0.03 % ophthalmic solution Place 1 drop into both eyes at bedtime.     . Continuous Blood Gluc Sensor (FREESTYLE LIBRE 14 DAY SENSOR) MISC 1 Device by Does not apply route every 14 (fourteen) days. DX: E11.9 (checks BS bid) 6 each 3  . dexlansoprazole (DEXILANT) 60 MG capsule Take 1 capsule (60 mg total) by mouth daily. 90 capsule 3  . diltiazem (CARDIZEM) 120 MG tablet Take 1 tablet (120 mg total) by mouth daily. 90 tablet 3  . doxazosin (CARDURA) 4 MG tablet Take 0.5 tablets (2 mg total) by mouth daily. 90 tablet  3  . empagliflozin (JARDIANCE) 25 MG TABS tablet Take 25 mg by mouth daily. 90 tablet 3  . hydrochlorothiazide (HYDRODIURIL) 25 MG tablet Take 1 tablet (25 mg total) by mouth daily. 90 tablet 1  . HYDROcodone-acetaminophen (NORCO) 10-325 MG per tablet Take 1 tablet by mouth every 6 (six) hours as needed for moderate pain.     Marland Kitchen ipratropium-albuterol (DUONEB) 0.5-2.5 (3) MG/3ML SOLN INHALE 1 VIAL VIA NEBULIZER EVERY 6 HOURS AS NEEDED 360 mL 0  . losartan (COZAAR) 100 MG tablet Take 1 tablet (100 mg total) by mouth daily. 90 tablet 3  . OXYGEN Inhale 2 L into the lungs continuous.    Marland Kitchen rOPINIRole (REQUIP) 2 MG tablet Take 1 tablet (2 mg total) by mouth 3 (three) times daily. 270 tablet 3  . tiotropium (SPIRIVA HANDIHALER) 18 MCG inhalation capsule Place 1 capsule (18 mcg total) into inhaler and inhale daily. (Patient not taking: Reported on 05/23/2019) 90 capsule 3   No current facility-administered medications on file prior to visit.    Allergies  Allergen Reactions  . Guaifenesin & Derivatives     Severe Reaction  . Benazepril Other (See Comments)    cough  . Latex     REACTION: powder on gloves  . Morphine     REACTION: nausea  . Zyrtec [Cetirizine] Other (See Comments)    swelling   Social History   Socioeconomic History  . Marital status: Married    Spouse name: Not  on file  . Number of children: 1  . Years of education: Not on file  . Highest education level: Not on file  Occupational History  . Occupation: Retired  Scientific laboratory technician  . Financial resource strain: Not on file  . Food insecurity    Worry: Not on file    Inability: Not on file  . Transportation needs    Medical: Not on file    Non-medical: Not on file  Tobacco Use  . Smoking status: Former Smoker    Types: Cigarettes    Quit date: 08/12/2018    Years since quitting: 0.7  . Smokeless tobacco: Never Used  Substance and Sexual Activity  . Alcohol use: No    Alcohol/week: 0.0 standard drinks  . Drug use: No  . Sexual activity: Not on file  Lifestyle  . Physical activity    Days per week: Not on file    Minutes per session: Not on file  . Stress: Not on file  Relationships  . Social Herbalist on phone: Not on file    Gets together: Not on file    Attends religious service: Not on file    Active member of club or organization: Not on file    Attends meetings of clubs or organizations: Not on file    Relationship status: Not on file  . Intimate partner violence    Fear of current or ex partner: Not on file    Emotionally abused: Not on file    Physically abused: Not on file    Forced sexual activity: Not on file  Other Topics Concern  . Not on file  Social History Narrative  . Not on file   Family History  Problem Relation Age of Onset  . Diabetes Mother   . Hemophilia Mother   . Breast cancer Sister   . Heart disease Father   . Colon polyps Father   . Diabetes Brother   . Colon polyps Sister  Review of Systems  Neurological: Positive for dizziness.  All other systems reviewed and are negative.      Objective:   Physical Exam  Constitutional: She is oriented to person, place, and time. She appears well-developed and well-nourished. No distress.  Neck: Neck supple. No JVD present. No thyromegaly present.  Cardiovascular: Normal rate, regular  rhythm and intact distal pulses.  Murmur heard. Pulmonary/Chest: Effort normal and breath sounds normal. No respiratory distress. She has no wheezes. She has no rales.  Abdominal: Soft. Bowel sounds are normal. She exhibits no distension. There is no abdominal tenderness. There is no rebound and no guarding.  Musculoskeletal:        General: No edema.     Right lower leg: She exhibits no tenderness, no swelling and no deformity. No edema.  Lymphadenopathy:    She has no cervical adenopathy.  Neurological: She is alert and oriented to person, place, and time. She exhibits normal muscle tone. Coordination normal.  Skin: She is not diaphoretic.  Vitals reviewed.         Assessment & Plan:  Vitamin D deficiency - Plan: VITAMIN D 25 Hydroxy (Vit-D Deficiency, Fractures)  Vitamin B12 deficiency - Plan: Vitamin B12  Dizziness  Primary insomnia  Depression, major, single episode, moderate (HCC)  Trigger finger of right thumb  Carpal tunnel syndrome on right  Patient request that her vitamin D level be checked given her history of vitamin D deficiency.  She also requests her vitamin B12 level be checked given her history of B12 deficiency.  Given the numbness she is experiencing in her hand, I believe this is reasonable.  I believe her dizziness is most likely multifactorial and due to a lack of sleep, orthostatic hypotension, and polypharmacy.  I recommended that she discontinue doxazosin to see if the dizziness improves and I will focus more on trying to help her sleep.  Patient will discontinue Ambien and we will replace with Valium 5 mg p.o. nightly as needed sleep.  She can also use this during the day as needed for anxiety.  I recommended that we start to treat depression with Lexapro 10 mg a day.  I would like to reassess the patient in 3 to 4 weeks.  I believe she has a trigger finger in the right first MCP joint.  I believe she may have carpal tunnel syndrome in the right wrist.  I  believe she may have subacromial bursitis in the right shoulder all exacerbated by her recent overhead activity.  Together we have decided to simply monitor this for a week and see if the symptoms improve.  Cervical radiculopathy is also on the differential diagnosis.  More than 30 minutes was spent with the patient

## 2019-05-24 ENCOUNTER — Other Ambulatory Visit: Payer: Self-pay

## 2019-05-24 MED ORDER — ERGOCALCIFEROL 1.25 MG (50000 UT) PO CAPS: 50000.0000 [IU] | ORAL_CAPSULE | ORAL | 0 refills | Status: DC

## 2019-06-07 ENCOUNTER — Other Ambulatory Visit: Payer: Self-pay | Admitting: Family Medicine

## 2019-06-07 ENCOUNTER — Telehealth: Payer: Self-pay | Admitting: Family Medicine

## 2019-06-07 MED ORDER — ALBUTEROL SULFATE HFA 108 (90 BASE) MCG/ACT IN AERS: 2.0000 | INHALATION_SPRAY | RESPIRATORY_TRACT | 3 refills | Status: AC | PRN

## 2019-06-07 MED ORDER — DIAZEPAM 5 MG PO TABS: 5.0000 mg | ORAL_TABLET | Freq: Two times a day (BID) | ORAL | 1 refills | Status: DC | PRN

## 2019-06-07 MED ORDER — ESCITALOPRAM OXALATE 10 MG PO TABS: 10.0000 mg | ORAL_TABLET | Freq: Every day | ORAL | 3 refills | Status: AC

## 2019-06-07 MED ORDER — HYDROCHLOROTHIAZIDE 25 MG PO TABS: 25.0000 mg | ORAL_TABLET | Freq: Every day | ORAL | 1 refills | Status: DC

## 2019-06-07 MED ORDER — TIOTROPIUM BROMIDE-OLODATEROL 2.5-2.5 MCG/ACT IN AERS: 2.0000 | INHALATION_SPRAY | Freq: Every day | RESPIRATORY_TRACT | 3 refills | Status: DC

## 2019-06-07 NOTE — Telephone Encounter (Signed)
Pt would like to know if we can send her valium through meds by mail as this will not cost her anything?

## 2019-06-07 NOTE — Telephone Encounter (Signed)
I am ok with this whenever she needs a refill.

## 2019-06-09 NOTE — Telephone Encounter (Signed)
Unable to escribe rx for valium - faxed rx for valium to [facility]

## 2019-06-28 ENCOUNTER — Encounter: Payer: Self-pay | Admitting: Family Medicine

## 2019-06-28 ENCOUNTER — Ambulatory Visit (INDEPENDENT_AMBULATORY_CARE_PROVIDER_SITE_OTHER): Payer: Medicare Other | Admitting: Family Medicine

## 2019-06-28 VITALS — BP 164/58 | HR 100 | Temp 97.8°F | Resp 22 | Ht 64.0 in | Wt 196.0 lb

## 2019-06-28 DIAGNOSIS — J449 Chronic obstructive pulmonary disease, unspecified: Secondary | ICD-10-CM | POA: Diagnosis not present

## 2019-06-28 DIAGNOSIS — R0902 Hypoxemia: Secondary | ICD-10-CM | POA: Diagnosis not present

## 2019-06-28 DIAGNOSIS — I10 Essential (primary) hypertension: Secondary | ICD-10-CM

## 2019-06-28 DIAGNOSIS — M25511 Pain in right shoulder: Secondary | ICD-10-CM | POA: Diagnosis not present

## 2019-06-28 DIAGNOSIS — Z23 Encounter for immunization: Secondary | ICD-10-CM | POA: Diagnosis not present

## 2019-06-28 MED ORDER — DOXAZOSIN MESYLATE 2 MG PO TABS: 2.0000 mg | ORAL_TABLET | Freq: Every day | ORAL | 0 refills | Status: DC

## 2019-06-28 NOTE — Addendum Note (Signed)
Addended by: Shary Decamp B on: 06/28/2019 10:50 AM   Modules accepted: Orders

## 2019-06-28 NOTE — Progress Notes (Signed)
Subjective:    Patient ID: [name] [name], female    DOB: 1951-04-18, 68 y.o.   MRN: [id number]  Patient states that she is doing much better with regards to her depression after the death of her husband.  She states she feels better.  She actually looks better today.  Her color is better.  She appears to have more energy.  She seems happier and more upbeat.  She is here today to recertify her oxygen.  Patient oxygen level drops to 88% on room air with ambulation greater than 120 feet.  She uses the oxygen only with activity.  Without activity, the patient does not have any shortness of breath.  Earlier this year, the patient was admitted to the hospital with a severe COPD exacerbation and was almost intubated.  She has quit smoking since that time but she occasionally does experience hypoxia with activity and therefore would like to have the oxygen to be used with activity.  She also complains of pain in her right shoulder.  She has pain with abduction greater than 90 degrees.  It hurts and aches to sleep on her shoulder at night.  She has a positive empty can sign and a positive Hawkins sign.  Her blood pressure is also elevated today.  Earlier this year we discontinue doxazosin due to orthostatic hypotension.  However now the patient has more energy and is eating better her blood pressure has risen to 164/58.  She checks it at home and finds it to be 148-150/60. Past Medical History:  Diagnosis Date  . Anxiety   . Arthritis   . CAD (coronary artery disease)   . Chronic kidney disease    Renal insufficiency  . COPD (chronic obstructive pulmonary disease) ([place])   . Depression   . Diabetes mellitus   . Diverticulosis of colon (without mention of hemorrhage)   . Duodenitis without mention of hemorrhage   . GERD (gastroesophageal reflux disease)   . Hemorrhoids   . Hypercholesteremia   . Hypertension   . Insomnia   . Tinnitus   . Vertigo       Past Surgical History:  Procedure  Laterality Date  . ANGIOPLASTY    . CHOLECYSTECTOMY    . ROTATOR CUFF REPAIR Right 04/2012  . TUBAL LIGATION     Current Outpatient Medications on File Prior to Visit  Medication Sig Dispense Refill  . albuterol (VENTOLIN HFA) 108 (90 Base) MCG/ACT inhaler Inhale 2 puffs into the lungs every 4 (four) hours as needed for wheezing or shortness of breath. 54 g 3  . atorvastatin (LIPITOR) 20 MG tablet Take 1 tablet (20 mg total) by mouth daily. 90 tablet 3  . bimatoprost (LUMIGAN) 0.03 % ophthalmic solution Place 1 drop into both eyes at bedtime.     . Continuous Blood Gluc Sensor (FREESTYLE LIBRE 14 DAY SENSOR) MISC 1 Device by Does not apply route every 14 (fourteen) days. DX: E11.9 (checks BS bid) 6 each 3  . dexlansoprazole (DEXILANT) 60 MG capsule Take 1 capsule (60 mg total) by mouth daily. 90 capsule 3  . diazepam (VALIUM) 5 MG tablet Take 1 tablet (5 mg total) by mouth every 12 (twelve) hours as needed for anxiety (insomnia). 60 tablet 1  . diltiazem (CARDIZEM) 120 MG tablet Take 1 tablet (120 mg total) by mouth daily. 90 tablet 3  . empagliflozin (JARDIANCE) 25 MG TABS tablet Take 25 mg by mouth daily. 90 tablet 3  . ergocalciferol (VITAMIN D2) 1.25 MG (  50000 UT) capsule Take 1 capsule (50,000 Units total) by mouth once a week. 24 capsule 0  . escitalopram (LEXAPRO) 10 MG tablet Take 1 tablet (10 mg total) by mouth daily. 90 tablet 3  . hydrochlorothiazide (HYDRODIURIL) 25 MG tablet Take 1 tablet (25 mg total) by mouth daily. 90 tablet 1  . HYDROcodone-acetaminophen (NORCO) 10-325 MG per tablet Take 1 tablet by mouth every 6 (six) hours as needed for moderate pain.     Marland Kitchen ipratropium-albuterol (DUONEB) 0.5-2.5 (3) MG/3ML SOLN INHALE 1 VIAL VIA NEBULIZER EVERY 6 HOURS AS NEEDED 360 mL 0  . losartan (COZAAR) 100 MG tablet Take 1 tablet (100 mg total) by mouth daily. 90 tablet 3  . OXYGEN Inhale 2 L into the lungs continuous.    Marland Kitchen rOPINIRole (REQUIP) 2 MG tablet Take 1 tablet (2 mg total) by  mouth 3 (three) times daily. 270 tablet 3  . Tiotropium Bromide-Olodaterol 2.5-2.5 MCG/ACT AERS Inhale 2 puffs into the lungs daily. 12 g 3   No current facility-administered medications on file prior to visit.    Allergies  Allergen Reactions  . Guaifenesin & Derivatives     Severe Reaction  . Benazepril Other (See Comments)    cough  . Latex     REACTION: powder on gloves  . Morphine     REACTION: nausea  . Zyrtec [Cetirizine] Other (See Comments)    swelling   Social History   Socioeconomic History  . Marital status: Married    Spouse name: Not on file  . Number of children: 1  . Years of education: Not on file  . Highest education level: Not on file  Occupational History  . Occupation: Retired  Scientific laboratory technician  . Financial resource strain: Not on file  . Food insecurity    Worry: Not on file    Inability: Not on file  . Transportation needs    Medical: Not on file    Non-medical: Not on file  Tobacco Use  . Smoking status: Former Smoker    Types: Cigarettes    Quit date: 08/12/2018    Years since quitting: 0.8  . Smokeless tobacco: Never Used  Substance and Sexual Activity  . Alcohol use: No    Alcohol/week: 0.0 standard drinks  . Drug use: No  . Sexual activity: Not on file  Lifestyle  . Physical activity    Days per week: Not on file    Minutes per session: Not on file  . Stress: Not on file  Relationships  . Social Herbalist on phone: Not on file    Gets together: Not on file    Attends religious service: Not on file    Active member of club or organization: Not on file    Attends meetings of clubs or organizations: Not on file    Relationship status: Not on file  . Intimate partner violence    Fear of current or ex partner: Not on file    Emotionally abused: Not on file    Physically abused: Not on file    Forced sexual activity: Not on file  Other Topics Concern  . Not on file  Social History Narrative  . Not on file   Family  History  Problem Relation Age of Onset  . Diabetes Mother   . Hemophilia Mother   . Breast cancer Sister   . Heart disease Father   . Colon polyps Father   . Diabetes Brother   .  Colon polyps Sister       Review of Systems  Neurological: Positive for dizziness.  All other systems reviewed and are negative.      Objective:   Physical Exam  Constitutional: She is oriented to person, place, and time. She appears well-developed and well-nourished. No distress.  Neck: Neck supple. No JVD present. No thyromegaly present.  Cardiovascular: Normal rate, regular rhythm and intact distal pulses.  Murmur heard. Pulmonary/Chest: Effort normal and breath sounds normal. No respiratory distress. She has no wheezes. She has no rales.  Abdominal: Soft. Bowel sounds are normal. She exhibits no distension. There is no abdominal tenderness. There is no rebound and no guarding.  Musculoskeletal:        General: No edema.     Right lower leg: She exhibits no tenderness, no swelling and no deformity. No edema.  Lymphadenopathy:    She has no cervical adenopathy.  Neurological: She is alert and oriented to person, place, and time. She exhibits normal muscle tone. Coordination normal.  Skin: She is not diaphoretic.  Vitals reviewed.         Assessment & Plan:  Acute pain of right shoulder  Chronic obstructive pulmonary disease, unspecified COPD type ([place])  Hypoxia  Patient qualifies for oxygen 2 L with activity.  With room air her oxygen level drops to 88% after walking around the office.  On oxygen, the patient is able to maintain her oxygen saturations greater than 90% on 2 L with activity.  Using sterile technique, I injected the right subacromial space with 2 cc lidocaine, 2 cc of Marcaine, and 2 cc of 40 mg/mL Kenalog.  The patient tolerated the procedure well without complication.  I have recommended the patient resume doxazosin 2 mg p.o. daily for hypertension and recheck via email her  blood pressure in 1 week.  I am so happy that she is doing better with regards to her depression.  We will continue Lexapro at his current dose.  The patient also received her flu shot today.

## 2019-07-04 ENCOUNTER — Encounter: Payer: Self-pay | Admitting: Family Medicine

## 2019-07-04 MED ORDER — DILTIAZEM HCL ER COATED BEADS 180 MG PO TB24: 180.0000 mg | ORAL_TABLET | Freq: Every day | ORAL | 1 refills | Status: DC

## 2019-07-18 ENCOUNTER — Ambulatory Visit (INDEPENDENT_AMBULATORY_CARE_PROVIDER_SITE_OTHER): Payer: Medicare Other | Admitting: Family Medicine

## 2019-07-18 ENCOUNTER — Other Ambulatory Visit: Payer: Self-pay

## 2019-07-18 ENCOUNTER — Encounter: Payer: Self-pay | Admitting: Family Medicine

## 2019-07-18 VITALS — BP 140/60 | HR 73 | Temp 96.9°F | Resp 20 | Ht 64.0 in | Wt 200.0 lb

## 2019-07-18 DIAGNOSIS — M25512 Pain in left shoulder: Secondary | ICD-10-CM | POA: Diagnosis not present

## 2019-07-18 NOTE — Progress Notes (Signed)
Subjective:    Patient ID: [name] [name], female    DOB: 27-Oct-1950, 68 y.o.   MRN: [id number]  06/28/19  Patient states that she is doing much better with regards to her depression after the death of her husband.  She states she feels better.  She actually looks better today.  Her color is better.  She appears to have more energy.  She seems happier and more upbeat.  She is here today to recertify her oxygen.  Patient oxygen level drops to 88% on room air with ambulation greater than 120 feet.  She uses the oxygen only with activity.  Without activity, the patient does not have any shortness of breath.  Earlier this year, the patient was admitted to the hospital with a severe COPD exacerbation and was almost intubated.  She has quit smoking since that time but she occasionally does experience hypoxia with activity and therefore would like to have the oxygen to be used with activity.  She also complains of pain in her right shoulder.  She has pain with abduction greater than 90 degrees.  It hurts and aches to sleep on her shoulder at night.  She has a positive empty can sign and a positive Hawkins sign.  Her blood pressure is also elevated today.  Earlier this year we discontinue doxazosin due to orthostatic hypotension.  However now the patient has more energy and is eating better her blood pressure has risen to 164/58.  She checks it at home and finds it to be 148-150/60.  At that time, my plan was: Patient qualifies for oxygen 2 L with activity.  With room air her oxygen level drops to 88% after walking around the office.  On oxygen, the patient is able to maintain her oxygen saturations greater than 90% on 2 L with activity.  Using sterile technique, I injected the right subacromial space with 2 cc lidocaine, 2 cc of Marcaine, and 2 cc of 40 mg/mL Kenalog.  The patient tolerated the procedure well without complication.  I have recommended the patient resume doxazosin 2 mg p.o. daily for hypertension  and recheck via email her blood pressure in 1 week.  I am so happy that she is doing better with regards to her depression.  We will continue Lexapro at his current dose.  The patient also received her flu shot today.  07/18/19 Thursday, the patient tripped walking into a business and landed on her left shoulder.  She has a large bruise encompassing the entire left bicep.  However the proximal and distal insertion of the bicep tendon is intact.  She has normal elbow flexion without any pain.  She does have some mild tenderness to palpation over the medial body of the biceps where there is the large hematoma however there is no evidence of compartment syndrome.  She does complain of pain in her left shoulder with abduction greater than 85 degrees.  She has weakness with resisted shoulder abduction.  She has a positive empty can sign and pain with Hawkins maneuver suggesting traumatic bursitis although I cannot rule out a tear in the rotator cuff.  Patient continues to report orthostatic dizziness despite being on Cardizem 180 mg a day.  She discontinue doxazosin while I was out of town and increased her Cardizem from 120-180 however she continues to have some dizziness with standing.  This lasts a few seconds and then resolves on its own Past Medical History:  Diagnosis Date  . Anxiety   .  Arthritis   . CAD (coronary artery disease)   . Chronic kidney disease    Renal insufficiency  . COPD (chronic obstructive pulmonary disease) ([place])   . Depression   . Diabetes mellitus   . Diverticulosis of colon (without mention of hemorrhage)   . Duodenitis without mention of hemorrhage   . GERD (gastroesophageal reflux disease)   . Hemorrhoids   . Hypercholesteremia   . Hypertension   . Insomnia   . Tinnitus   . Vertigo       Past Surgical History:  Procedure Laterality Date  . ANGIOPLASTY    . CHOLECYSTECTOMY    . ROTATOR CUFF REPAIR Right 04/2012  . TUBAL LIGATION     Current Outpatient  Medications on File Prior to Visit  Medication Sig Dispense Refill  . albuterol (VENTOLIN HFA) 108 (90 Base) MCG/ACT inhaler Inhale 2 puffs into the lungs every 4 (four) hours as needed for wheezing or shortness of breath. 54 g 3  . atorvastatin (LIPITOR) 20 MG tablet Take 1 tablet (20 mg total) by mouth daily. 90 tablet 3  . bimatoprost (LUMIGAN) 0.03 % ophthalmic solution Place 1 drop into both eyes at bedtime.     . Continuous Blood Gluc Sensor (FREESTYLE LIBRE 14 DAY SENSOR) MISC 1 Device by Does not apply route every 14 (fourteen) days. DX: E11.9 (checks BS bid) 6 each 3  . dexlansoprazole (DEXILANT) 60 MG capsule Take 1 capsule (60 mg total) by mouth daily. 90 capsule 3  . diazepam (VALIUM) 5 MG tablet Take 1 tablet (5 mg total) by mouth every 12 (twelve) hours as needed for anxiety (insomnia). 60 tablet 1  . diltiazem (CARDIZEM LA) 180 MG 24 hr tablet Take 1 tablet (180 mg total) by mouth daily. 30 tablet 1  . empagliflozin (JARDIANCE) 25 MG TABS tablet Take 25 mg by mouth daily. 90 tablet 3  . ergocalciferol (VITAMIN D2) 1.25 MG (50000 UT) capsule Take 1 capsule (50,000 Units total) by mouth once a week. 24 capsule 0  . escitalopram (LEXAPRO) 10 MG tablet Take 1 tablet (10 mg total) by mouth daily. 90 tablet 3  . hydrochlorothiazide (HYDRODIURIL) 25 MG tablet Take 1 tablet (25 mg total) by mouth daily. 90 tablet 1  . HYDROcodone-acetaminophen (NORCO) 10-325 MG per tablet Take 1 tablet by mouth every 6 (six) hours as needed for moderate pain.     Marland Kitchen ipratropium-albuterol (DUONEB) 0.5-2.5 (3) MG/3ML SOLN INHALE 1 VIAL VIA NEBULIZER EVERY 6 HOURS AS NEEDED 360 mL 0  . losartan (COZAAR) 100 MG tablet Take 1 tablet (100 mg total) by mouth daily. 90 tablet 3  . OXYGEN Inhale 2 L into the lungs continuous.    Marland Kitchen rOPINIRole (REQUIP) 2 MG tablet Take 1 tablet (2 mg total) by mouth 3 (three) times daily. 270 tablet 3  . Tiotropium Bromide-Olodaterol 2.5-2.5 MCG/ACT AERS Inhale 2 puffs into the lungs  daily. 12 g 3   No current facility-administered medications on file prior to visit.    Allergies  Allergen Reactions  . Guaifenesin & Derivatives     Severe Reaction  . Benazepril Other (See Comments)    cough  . Latex     REACTION: powder on gloves  . Morphine     REACTION: nausea  . Zyrtec [Cetirizine] Other (See Comments)    swelling   Social History   Socioeconomic History  . Marital status: Widowed    Spouse name: Not on file  . Number of children: 1  .  Years of education: Not on file  . Highest education level: Not on file  Occupational History  . Occupation: Retired  Scientific laboratory technician  . Financial resource strain: Not on file  . Food insecurity    Worry: Not on file    Inability: Not on file  . Transportation needs    Medical: Not on file    Non-medical: Not on file  Tobacco Use  . Smoking status: Former Smoker    Types: Cigarettes    Quit date: 08/12/2018    Years since quitting: 0.9  . Smokeless tobacco: Never Used  Substance and Sexual Activity  . Alcohol use: No    Alcohol/week: 0.0 standard drinks  . Drug use: No  . Sexual activity: Not on file  Lifestyle  . Physical activity    Days per week: Not on file    Minutes per session: Not on file  . Stress: Not on file  Relationships  . Social Herbalist on phone: Not on file    Gets together: Not on file    Attends religious service: Not on file    Active member of club or organization: Not on file    Attends meetings of clubs or organizations: Not on file    Relationship status: Not on file  . Intimate partner violence    Fear of current or ex partner: Not on file    Emotionally abused: Not on file    Physically abused: Not on file    Forced sexual activity: Not on file  Other Topics Concern  . Not on file  Social History Narrative  . Not on file   Family History  Problem Relation Age of Onset  . Diabetes Mother   . Hemophilia Mother   . Breast cancer Sister   . Heart disease  Father   . Colon polyps Father   . Diabetes Brother   . Colon polyps Sister       Review of Systems  Neurological: Positive for dizziness.  All other systems reviewed and are negative.      Objective:   Physical Exam  Constitutional: She is oriented to person, place, and time. She appears well-developed and well-nourished. No distress.  Neck: Neck supple. No JVD present. No thyromegaly present.  Cardiovascular: Normal rate, regular rhythm and intact distal pulses.  Murmur heard. Pulmonary/Chest: Effort normal and breath sounds normal. No respiratory distress. She has no wheezes. She has no rales.  Abdominal: Soft. Bowel sounds are normal. She exhibits no distension. There is no abdominal tenderness. There is no rebound and no guarding.  Musculoskeletal:        General: No edema.     Right shoulder: Normal.     Left shoulder: She exhibits decreased range of motion, tenderness, pain and decreased strength.       Arms:     Right lower leg: She exhibits no tenderness, no swelling and no deformity. No edema.  Lymphadenopathy:    She has no cervical adenopathy.  Neurological: She is alert and oriented to person, place, and time. She exhibits normal muscle tone. Coordination normal.  Skin: She is not diaphoretic.  Vitals reviewed.         Assessment & Plan:  Acute pain of left shoulder  I believe the patient has traumatic bursitis in her left shoulder.  I cannot rule out a partial tear in the supraspinatus.  Using sterile technique, I injected the patient's left subacromial space with 2 cc lidocaine,  2 cc of Marcaine, and 2 cc of 40 mg/mL Kenalog.  The patient tolerated the procedure well without complication.  I believe the patient's orthostatic dizziness is multifactorial.  I recommend that she continue the Cardizem at the higher dose 180 mg a day and allow another 7 to 10 days for her body to adjust.  If this does not improve, consider switching back to the original dose of  Cardizem which is 120 mg and trying a low-dose beta-blocker.

## 2019-07-22 ENCOUNTER — Other Ambulatory Visit: Payer: Self-pay

## 2019-07-22 ENCOUNTER — Ambulatory Visit
Admission: RE | Admit: 2019-07-22 | Discharge: 2019-07-22 | Disposition: A | Discharge status: Home / Self Care | Payer: Medicare Other | Source: Ambulatory Visit | Attending: Family Medicine | Admitting: Family Medicine

## 2019-07-22 ENCOUNTER — Ambulatory Visit (INDEPENDENT_AMBULATORY_CARE_PROVIDER_SITE_OTHER): Payer: Medicare Other | Admitting: Family Medicine

## 2019-07-22 ENCOUNTER — Encounter: Payer: Self-pay | Admitting: Family Medicine

## 2019-07-22 VITALS — BP 126/60 | HR 74 | Temp 97.1°F | Resp 16 | Ht 64.0 in | Wt 200.0 lb

## 2019-07-22 DIAGNOSIS — I10 Essential (primary) hypertension: Secondary | ICD-10-CM | POA: Diagnosis not present

## 2019-07-22 DIAGNOSIS — R0689 Other abnormalities of breathing: Secondary | ICD-10-CM

## 2019-07-22 DIAGNOSIS — R42 Dizziness and giddiness: Secondary | ICD-10-CM

## 2019-07-22 DIAGNOSIS — R0989 Other specified symptoms and signs involving the circulatory and respiratory systems: Secondary | ICD-10-CM | POA: Diagnosis not present

## 2019-07-22 DIAGNOSIS — E119 Type 2 diabetes mellitus without complications: Secondary | ICD-10-CM | POA: Diagnosis not present

## 2019-07-22 MED ORDER — FREESTYLE LIBRE 14 DAY SENSOR MISC: 1.0000 | 3 refills | Status: DC

## 2019-07-22 NOTE — Progress Notes (Signed)
Subjective:    Patient ID: [name] [name], female    DOB: 1951-02-07, 68 y.o.   MRN: [id number]  06/28/19  Patient states that she is doing much better with regards to her depression after the death of her husband.  She states she feels better.  She actually looks better today.  Her color is better.  She appears to have more energy.  She seems happier and more upbeat.  She is here today to recertify her oxygen.  Patient oxygen level drops to 88% on room air with ambulation greater than 120 feet.  She uses the oxygen only with activity.  Without activity, the patient does not have any shortness of breath.  Earlier this year, the patient was admitted to the hospital with a severe COPD exacerbation and was almost intubated.  She has quit smoking since that time but she occasionally does experience hypoxia with activity and therefore would like to have the oxygen to be used with activity.  She also complains of pain in her right shoulder.  She has pain with abduction greater than 90 degrees.  It hurts and aches to sleep on her shoulder at night.  She has a positive empty can sign and a positive Hawkins sign.  Her blood pressure is also elevated today.  Earlier this year we discontinue doxazosin due to orthostatic hypotension.  However now the patient has more energy and is eating better her blood pressure has risen to 164/58.  She checks it at home and finds it to be 148-150/60.  At that time, my plan was: Patient qualifies for oxygen 2 L with activity.  With room air her oxygen level drops to 88% after walking around the office.  On oxygen, the patient is able to maintain her oxygen saturations greater than 90% on 2 L with activity.  Using sterile technique, I injected the right subacromial space with 2 cc lidocaine, 2 cc of Marcaine, and 2 cc of 40 mg/mL Kenalog.  The patient tolerated the procedure well without complication.  I have recommended the patient resume doxazosin 2 mg p.o. daily for hypertension  and recheck via email her blood pressure in 1 week.  I am so happy that she is doing better with regards to her depression.  We will continue Lexapro at his current dose.  The patient also received her flu shot today.  07/18/19 Thursday, the patient tripped walking into a business and landed on her left shoulder.  She has a large bruise encompassing the entire left bicep.  However the proximal and distal insertion of the bicep tendon is intact.  She has normal elbow flexion without any pain.  She does have some mild tenderness to palpation over the medial body of the biceps where there is the large hematoma however there is no evidence of compartment syndrome.  She does complain of pain in her left shoulder with abduction greater than 85 degrees.  She has weakness with resisted shoulder abduction.  She has a positive empty can sign and pain with Hawkins maneuver suggesting traumatic bursitis although I cannot rule out a tear in the rotator cuff.  Patient continues to report orthostatic dizziness despite being on Cardizem 180 mg a day.  She discontinue doxazosin while I was out of town and increased her Cardizem from 120-180 however she continues to have some dizziness with standing.  This lasts a few seconds and then resolves on its own.  AT that time, my plan was: I believe the patient has  traumatic bursitis in her left shoulder.  I cannot rule out a partial tear in the supraspinatus.  Using sterile technique, I injected the patient's left subacromial space with 2 cc lidocaine, 2 cc of Marcaine, and 2 cc of 40 mg/mL Kenalog.  The patient tolerated the procedure well without complication.  I believe the patient's orthostatic dizziness is multifactorial.  I recommend that she continue the Cardizem at the higher dose 180 mg a day and allow another 7 to 10 days for her body to adjust.  If this does not improve, consider switching back to the original dose of Cardizem which is 120 mg and trying a low-dose  beta-blocker.  07/22/19 Patient presents today complaining of dizziness and lightheadedness.  She states that if she looks straight up, she will feel like she is going to blackout.  She states if she turns her head side to side, she feels like she is losing her balance and that her environment may be spinning.  She feels unsteady driving as she is having a difficult time gauging her distance between the lines and the side of the road.  She reports feeling unsteady on her feet.  She denies any chest pain or shortness of breath however physical exam is significant for bibasilar crackles.  She also has irregularly irregular heart rate.  It sounds like she is having frequent PVCs every third or fourth heart beat.  She does not perceive this.  She does report some vertigo-like symptoms however the majority of her symptoms sound like imbalance and disequilibrium.  Polypharmacy is certainly concern particular given the Requip, the Jardiance, her pain medication, and all of her antihypertensives.   Past Medical History:  Diagnosis Date  . Anxiety   . Arthritis   . CAD (coronary artery disease)   . Chronic kidney disease    Renal insufficiency  . COPD (chronic obstructive pulmonary disease) ([place])   . Depression   . Diabetes mellitus   . Diverticulosis of colon (without mention of hemorrhage)   . Duodenitis without mention of hemorrhage   . GERD (gastroesophageal reflux disease)   . Hemorrhoids   . Hypercholesteremia   . Hypertension   . Insomnia   . Tinnitus   . Vertigo       Past Surgical History:  Procedure Laterality Date  . ANGIOPLASTY    . CHOLECYSTECTOMY    . ROTATOR CUFF REPAIR Right 04/2012  . TUBAL LIGATION     Current Outpatient Medications on File Prior to Visit  Medication Sig Dispense Refill  . albuterol (VENTOLIN HFA) 108 (90 Base) MCG/ACT inhaler Inhale 2 puffs into the lungs every 4 (four) hours as needed for wheezing or shortness of breath. 54 g 3  . atorvastatin  (LIPITOR) 20 MG tablet Take 1 tablet (20 mg total) by mouth daily. 90 tablet 3  . bimatoprost (LUMIGAN) 0.03 % ophthalmic solution Place 1 drop into both eyes at bedtime.     . Continuous Blood Gluc Sensor (FREESTYLE LIBRE 14 DAY SENSOR) MISC 1 Device by Does not apply route every 14 (fourteen) days. DX: E11.9 (checks BS bid) 6 each 3  . dexlansoprazole (DEXILANT) 60 MG capsule Take 1 capsule (60 mg total) by mouth daily. 90 capsule 3  . diazepam (VALIUM) 5 MG tablet Take 1 tablet (5 mg total) by mouth every 12 (twelve) hours as needed for anxiety (insomnia). 60 tablet 1  . diltiazem (CARDIZEM LA) 180 MG 24 hr tablet Take 1 tablet (180 mg total) by mouth daily.  30 tablet 1  . empagliflozin (JARDIANCE) 25 MG TABS tablet Take 25 mg by mouth daily. 90 tablet 3  . ergocalciferol (VITAMIN D2) 1.25 MG (50000 UT) capsule Take 1 capsule (50,000 Units total) by mouth once a week. 24 capsule 0  . escitalopram (LEXAPRO) 10 MG tablet Take 1 tablet (10 mg total) by mouth daily. 90 tablet 3  . hydrochlorothiazide (HYDRODIURIL) 25 MG tablet Take 1 tablet (25 mg total) by mouth daily. 90 tablet 1  . HYDROcodone-acetaminophen (NORCO) 10-325 MG per tablet Take 1 tablet by mouth every 6 (six) hours as needed for moderate pain.     Marland Kitchen ipratropium-albuterol (DUONEB) 0.5-2.5 (3) MG/3ML SOLN INHALE 1 VIAL VIA NEBULIZER EVERY 6 HOURS AS NEEDED 360 mL 0  . losartan (COZAAR) 100 MG tablet Take 1 tablet (100 mg total) by mouth daily. 90 tablet 3  . OXYGEN Inhale 2 L into the lungs continuous.    Marland Kitchen rOPINIRole (REQUIP) 2 MG tablet Take 1 tablet (2 mg total) by mouth 3 (three) times daily. 270 tablet 3  . Tiotropium Bromide-Olodaterol 2.5-2.5 MCG/ACT AERS Inhale 2 puffs into the lungs daily. 12 g 3   No current facility-administered medications on file prior to visit.   Allergies  Allergen Reactions  . Guaifenesin & Derivatives     Severe Reaction  . Benazepril Other (See Comments)    cough  . Latex     REACTION: powder  on gloves  . Morphine     REACTION: nausea  . Zyrtec [Cetirizine] Other (See Comments)    swelling   Social History   Socioeconomic History  . Marital status: Widowed    Spouse name: Not on file  . Number of children: 1  . Years of education: Not on file  . Highest education level: Not on file  Occupational History  . Occupation: Retired  Tobacco Use  . Smoking status: Former Smoker    Types: Cigarettes    Quit date: 08/12/2018    Years since quitting: 0.9  . Smokeless tobacco: Never Used  Substance and Sexual Activity  . Alcohol use: No    Alcohol/week: 0.0 standard drinks  . Drug use: No  . Sexual activity: Not on file  Other Topics Concern  . Not on file  Social History Narrative  . Not on file   Social Determinants of Health   Financial Resource Strain:   . Difficulty of Paying Living Expenses: Not on file  Food Insecurity:   . Worried About Charity fundraiser in the Last Year: Not on file  . Ran Out of Food in the Last Year: Not on file  Transportation Needs:   . Lack of Transportation (Medical): Not on file  . Lack of Transportation (Non-Medical): Not on file  Physical Activity:   . Days of Exercise per Week: Not on file  . Minutes of Exercise per Session: Not on file  Stress:   . Feeling of Stress : Not on file  Social Connections:   . Frequency of Communication with Friends and Family: Not on file  . Frequency of Social Gatherings with Friends and Family: Not on file  . Attends Religious Services: Not on file  . Active Member of Clubs or Organizations: Not on file  . Attends Archivist Meetings: Not on file  . Marital Status: Not on file  Intimate Partner Violence:   . Fear of Current or Ex-Partner: Not on file  . Emotionally Abused: Not on file  . Physically  Abused: Not on file  . Sexually Abused: Not on file   Family History  Problem Relation Age of Onset  . Diabetes Mother   . Hemophilia Mother   . Breast cancer Sister   . Heart  disease Father   . Colon polyps Father   . Diabetes Brother   . Colon polyps Sister       Review of Systems  Neurological: Positive for dizziness.  All other systems reviewed and are negative.      Objective:   Physical Exam  Constitutional: She is oriented to person, place, and time. She appears well-developed and well-nourished. No distress.  Neck: No JVD present. No thyromegaly present.  Cardiovascular: Normal rate and intact distal pulses. A regularly irregular rhythm present.  Murmur heard. Pulmonary/Chest: Effort normal. No respiratory distress. She has no wheezes. She has rales in the right lower field and the left lower field.  Abdominal: Soft. Bowel sounds are normal. She exhibits no distension. There is no abdominal tenderness. There is no rebound and no guarding.  Musculoskeletal:        General: No edema.     Cervical back: Neck supple.  Lymphadenopathy:    She has no cervical adenopathy.  Neurological: She is alert and oriented to person, place, and time. She exhibits normal muscle tone. Coordination normal.  Skin: She is not diaphoretic.  Vitals reviewed.   Wt Readings from Last 3 Encounters:  07/22/19 200 lb (90.7 kg)  07/18/19 200 lb (90.7 kg)  06/28/19 196 lb (88.9 kg)         Assessment & Plan:  Intermittent lightheadedness - Plan: EKG 12-Lead  Essential hypertension  Controlled type 2 diabetes mellitus without complication, without long-term current use of insulin ([place]) - Plan: EKG 12-Lead, CBC with Differential, COMPLETE METABOLIC PANEL WITH GFR, Hemoglobin A1c  Abnormal breath sounds - Plan: DG Chest 2 View  EKG shows normal sinus rhythm with no evidence of ischemia or infarction however it does show frequent PVCs.  Patient appears to be having a PVC every 3rd-4th heartbeat.  This potentially could be contributing some to her lightheadedness.  However I feel the majority of this is due to polypharmacy possibly with some mild vertigo.  We went  over her medicine list.  The biggest contributing factors would be the hydrocodone that she takes every 4-5 hours, the Requip that she takes twice a day, the Valium that she takes once a day, and the combination of antihypertensives coupled with Jardiance.  Therefore I have recommended reducing the Requip as much as possible.  She will try to reduce it to once a day primarily at night.  I also recommended reducing the hydrocodone is much as possible.  I have recommended temporarily discontinuing Jardiance in case the diuretic effect is leaving her dehydrated.  I also recommended reducing her Cardizem back to 120 mg.  We will then recheck next week to see if the patient is feeling better.  I will check a CBC and a CMP to evaluate for any anemia or evidence of dehydration that can contribute to cerebral hypoperfusion.  Also given the abnormal breath sounds I have recommended a chest x-ray.  Recheck next week and see how the patient is feeling.

## 2019-07-23 LAB — COMPLETE METABOLIC PANEL WITH GFR
AG Ratio: 1.1 (calc) (ref 1.0–2.5)
ALT: 50 U/L — ABNORMAL HIGH (ref 6–29)
AST: 39 U/L — ABNORMAL HIGH (ref 10–35)
Albumin: 3.9 g/dL (ref 3.6–5.1)
Alkaline phosphatase (APISO): 69 U/L (ref 37–153)
BUN/Creatinine Ratio: 23 (calc) — ABNORMAL HIGH (ref 6–22)
BUN: 25 mg/dL (ref 7–25)
CO2: 24 mmol/L (ref 20–32)
Calcium: 9.8 mg/dL (ref 8.6–10.4)
Chloride: 98 mmol/L (ref 98–110)
Creat: 1.08 mg/dL — ABNORMAL HIGH (ref 0.50–0.99)
GFR, Est African American: 61 mL/min/{1.73_m2} (ref >=60)
GFR, Est Non African American: 52 mL/min/{1.73_m2} — ABNORMAL LOW (ref >=60)
Globulin: 3.5 g/dL (calc) (ref 1.9–3.7)
Glucose, Bld: 155 mg/dL — ABNORMAL HIGH (ref 65–99)
Potassium: 5 mmol/L (ref 3.5–5.3)
Sodium: 136 mmol/L (ref 135–146)
Total Bilirubin: 0.4 mg/dL (ref 0.2–1.2)
Total Protein: 7.4 g/dL (ref 6.1–8.1)

## 2019-07-23 LAB — CBC WITH DIFFERENTIAL/PLATELET
Absolute Monocytes: 1037 cells/uL — ABNORMAL HIGH (ref 200–950)
Basophils Absolute: 27 cells/uL (ref 0–200)
Basophils Relative: 0.2 %
Eosinophils Absolute: 80 cells/uL (ref 15–500)
Eosinophils Relative: 0.6 %
HCT: 40.8 % (ref 35.0–45.0)
Hemoglobin: 13.3 g/dL (ref 11.7–15.5)
Lymphs Abs: 4163 cells/uL — ABNORMAL HIGH (ref 850–3900)
MCH: 28.1 pg (ref 27.0–33.0)
MCHC: 32.6 g/dL (ref 32.0–36.0)
MCV: 86.3 fL (ref 80.0–100.0)
MPV: 10 fL (ref 7.5–12.5)
Monocytes Relative: 7.8 %
Neutro Abs: 7993 cells/uL — ABNORMAL HIGH (ref 1500–7800)
Neutrophils Relative %: 60.1 %
Platelets: 422 10*3/uL — ABNORMAL HIGH (ref 140–400)
RBC: 4.73 10*6/uL (ref 3.80–5.10)
RDW: 14.5 % (ref 11.0–15.0)
Total Lymphocyte: 31.3 %
WBC: 13.3 10*3/uL — ABNORMAL HIGH (ref 3.8–10.8)

## 2019-07-23 LAB — HEMOGLOBIN A1C
Hgb A1c MFr Bld: 9.7 % of total Hgb — ABNORMAL HIGH (ref <5.7)
Mean Plasma Glucose: 232 (calc)
eAG (mmol/L): 12.8 (calc)

## 2019-07-25 ENCOUNTER — Telehealth: Payer: Self-pay

## 2019-07-25 MED ORDER — METFORMIN HCL 500 MG PO TABS: 500.0000 mg | ORAL_TABLET | Freq: Two times a day (BID) | ORAL | 0 refills | Status: DC

## 2019-07-25 NOTE — Telephone Encounter (Signed)
Error

## 2019-07-25 NOTE — Telephone Encounter (Signed)
Pt called to report that her dizziness is much better but she is still experiencing pain in her L shoulder. Pt would like to know if there is anything she can take to help with the pain. Pt states she has tried advil and aspirin with no relief. Please advise.

## 2019-07-26 ENCOUNTER — Other Ambulatory Visit: Payer: Self-pay | Admitting: Family Medicine

## 2019-07-26 ENCOUNTER — Telehealth: Payer: Self-pay

## 2019-07-26 DIAGNOSIS — M25512 Pain in left shoulder: Secondary | ICD-10-CM

## 2019-07-26 NOTE — Telephone Encounter (Signed)
Pt notified. Verbalizes understanding. Referral to Ortho has been placed.

## 2019-07-26 NOTE — Telephone Encounter (Signed)
Short of taking more pain medication I do not know what else to offer for her shoulder.  I would recommend getting her into see orthopedist.  A lot of times they will try another cortisone shot which may work better the second time.  Can we please consult orthopedics.

## 2019-07-27 NOTE — Telephone Encounter (Signed)
Error

## 2019-08-01 ENCOUNTER — Ambulatory Visit (INDEPENDENT_AMBULATORY_CARE_PROVIDER_SITE_OTHER): Payer: Medicare Other | Admitting: Family Medicine

## 2019-08-01 ENCOUNTER — Other Ambulatory Visit: Payer: Self-pay

## 2019-08-01 DIAGNOSIS — R0602 Shortness of breath: Secondary | ICD-10-CM | POA: Diagnosis not present

## 2019-08-01 MED ORDER — LEVOFLOXACIN 500 MG PO TABS: 500.0000 mg | ORAL_TABLET | Freq: Every day | ORAL | 0 refills | Status: DC

## 2019-08-01 MED ORDER — PREDNISONE 20 MG PO TABS: 60.0000 mg | ORAL_TABLET | Freq: Every day | ORAL | 0 refills | Status: DC

## 2019-08-01 NOTE — Progress Notes (Signed)
Subjective:    Patient ID: [name] [name], female    DOB: 05-Oct-1950, 68 y.o.   MRN: [id number]  HPI  Patient is a very pleasant 68 year old Caucasian female being seen today via telephone.  She consents to be seen via telephone.  Phone call began at 255.  Phone call concluded at 305.  Patient has a history of COPD and has been hospitalized in stepdown unit for COPD exacerbation.  Over the last several days, the patient has noticed increasing shortness of breath and dyspnea on exertion.  She denies any chest pain.  She denies any angina.  She denies any palpitations or tachycardia or lightheadedness.  However she has noticed increased wheezing.  If she uses her albuterol, she notices improvement and only for a short period of time less than an hour.  She denies any purulent sputum.  She denies any hemoptysis.  Patient is being seen over the telephone today.  However I can hear the patient wheeze over the telephone.  She also has increased respiratory rate.  I spoke with her and her son.  I recommended going to the emergency room however the patient states that her breathing is not that bad yet.  She states that she is feeling better after I directed her to take an albuterol treatment while we were talking.  Again she denies any chest pain to suggest cardiac source.  She also denies any fever or chills or purulent sputum to suggest pneumonia.  Patient denies any leg swelling to suggest heart failure or DVT.  Echocardiogram showed no evidence of congestive heart failure in 2019 Past Medical History:  Diagnosis Date  . Anxiety   . Arthritis   . CAD (coronary artery disease)   . Chronic kidney disease    Renal insufficiency  . COPD (chronic obstructive pulmonary disease) ([place])   . Depression   . Diabetes mellitus   . Diverticulosis of colon (without mention of hemorrhage)   . Duodenitis without mention of hemorrhage   . GERD (gastroesophageal reflux disease)   . Hemorrhoids   .  Hypercholesteremia   . Hypertension   . Insomnia   . Tinnitus   . Vertigo    Past Surgical History:  Procedure Laterality Date  . ANGIOPLASTY    . CHOLECYSTECTOMY    . ROTATOR CUFF REPAIR Right 04/2012  . TUBAL LIGATION     Current Outpatient Medications on File Prior to Visit  Medication Sig Dispense Refill  . albuterol (VENTOLIN HFA) 108 (90 Base) MCG/ACT inhaler Inhale 2 puffs into the lungs every 4 (four) hours as needed for wheezing or shortness of breath. 54 g 3  . atorvastatin (LIPITOR) 20 MG tablet Take 1 tablet (20 mg total) by mouth daily. 90 tablet 3  . bimatoprost (LUMIGAN) 0.03 % ophthalmic solution Place 1 drop into both eyes at bedtime.     . Continuous Blood Gluc Sensor (FREESTYLE LIBRE 14 DAY SENSOR) MISC CHANGE EVERY 14 DAYS 2 each 3  . dexlansoprazole (DEXILANT) 60 MG capsule Take 1 capsule (60 mg total) by mouth daily. 90 capsule 3  . diazepam (VALIUM) 5 MG tablet Take 1 tablet (5 mg total) by mouth every 12 (twelve) hours as needed for anxiety (insomnia). 60 tablet 1  . diltiazem (CARDIZEM LA) 180 MG 24 hr tablet Take 1 tablet (180 mg total) by mouth daily. 30 tablet 1  . empagliflozin (JARDIANCE) 25 MG TABS tablet Take 25 mg by mouth daily. 90 tablet 3  . ergocalciferol (VITAMIN D2)  1.25 MG (50000 UT) capsule Take 1 capsule (50,000 Units total) by mouth once a week. 24 capsule 0  . escitalopram (LEXAPRO) 10 MG tablet Take 1 tablet (10 mg total) by mouth daily. 90 tablet 3  . hydrochlorothiazide (HYDRODIURIL) 25 MG tablet Take 1 tablet (25 mg total) by mouth daily. 90 tablet 1  . HYDROcodone-acetaminophen (NORCO) 10-325 MG per tablet Take 1 tablet by mouth every 6 (six) hours as needed for moderate pain.     Marland Kitchen ipratropium-albuterol (DUONEB) 0.5-2.5 (3) MG/3ML SOLN INHALE 1 VIAL VIA NEBULIZER EVERY 6 HOURS AS NEEDED 360 mL 0  . losartan (COZAAR) 100 MG tablet Take 1 tablet (100 mg total) by mouth daily. 90 tablet 3  . metFORMIN (GLUCOPHAGE) 500 MG tablet Take 1 tablet  (500 mg total) by mouth 2 (two) times daily with a meal. 60 tablet 0  . OXYGEN Inhale 2 L into the lungs continuous.    Marland Kitchen rOPINIRole (REQUIP) 2 MG tablet Take 1 tablet (2 mg total) by mouth 3 (three) times daily. 270 tablet 3  . Tiotropium Bromide-Olodaterol 2.5-2.5 MCG/ACT AERS Inhale 2 puffs into the lungs daily. 12 g 3   No current facility-administered medications on file prior to visit.   Allergies  Allergen Reactions  . Guaifenesin & Derivatives     Severe Reaction  . Benazepril Other (See Comments)    cough  . Latex     REACTION: powder on gloves  . Morphine     REACTION: nausea  . Zyrtec [Cetirizine] Other (See Comments)    swelling   Social History   Socioeconomic History  . Marital status: Widowed    Spouse name: Not on file  . Number of children: 1  . Years of education: Not on file  . Highest education level: Not on file  Occupational History  . Occupation: Retired  Tobacco Use  . Smoking status: Former Smoker    Types: Cigarettes    Quit date: 08/12/2018    Years since quitting: 0.9  . Smokeless tobacco: Never Used  Substance and Sexual Activity  . Alcohol use: No    Alcohol/week: 0.0 standard drinks  . Drug use: No  . Sexual activity: Not on file  Other Topics Concern  . Not on file  Social History Narrative  . Not on file   Social Determinants of Health   Financial Resource Strain:   . Difficulty of Paying Living Expenses: Not on file  Food Insecurity:   . Worried About Charity fundraiser in the Last Year: Not on file  . Ran Out of Food in the Last Year: Not on file  Transportation Needs:   . Lack of Transportation (Medical): Not on file  . Lack of Transportation (Non-Medical): Not on file  Physical Activity:   . Days of Exercise per Week: Not on file  . Minutes of Exercise per Session: Not on file  Stress:   . Feeling of Stress : Not on file  Social Connections:   . Frequency of Communication with Friends and Family: Not on file  .  Frequency of Social Gatherings with Friends and Family: Not on file  . Attends Religious Services: Not on file  . Active Member of Clubs or Organizations: Not on file  . Attends Archivist Meetings: Not on file  . Marital Status: Not on file  Intimate Partner Violence:   . Fear of Current or Ex-Partner: Not on file  . Emotionally Abused: Not on file  .  Physically Abused: Not on file  . Sexually Abused: Not on file     Review of Systems  All other systems reviewed and are negative.      Objective:   Physical Exam  Physical exam cannot be performed today as the patient was seen over the telephone however she is audibly wheezing during our encounter and she seems short of breath.  We discussed going to the emergency room.  I discussed this with both her son and the patient.  Also directed the patient to take 3 inhalations of her albuterol metered-dose inhaler.  After approximately 5 minutes, patient states that her breathing improved dramatically.  Her respiratory rate improved and her shortness of breath improved.  Therefore patient declines going to the emergency room and would like to try outpatient therapy for COPD       Assessment & Plan:  Shortness of breath  I suspect COPD exacerbation.  We discussed going to the emergency room.  Patient declines this.  Instead we will start the patient on 80 mg of prednisone tonight and then 60 mg a day thereafter.  I want the patient to call me back first thing in the morning with a recheck.  She is also to use albuterol 2 puffs inhaled every 4-6 hours as needed and begin Levaquin 500 mg daily for 7 days for possible COPD exacerbation.  If the patient shortness of breath worsens she is to go immediately to the hospital.  I explained this to both the patient and her son.  They are comfortable with this plan

## 2019-08-04 ENCOUNTER — Other Ambulatory Visit: Payer: Self-pay | Admitting: Family Medicine

## 2019-08-09 ENCOUNTER — Other Ambulatory Visit: Payer: Self-pay

## 2019-08-09 ENCOUNTER — Ambulatory Visit (INDEPENDENT_AMBULATORY_CARE_PROVIDER_SITE_OTHER): Payer: Medicare Other | Admitting: Family Medicine

## 2019-08-09 VITALS — BP 100/56 | HR 76 | Temp 98.1°F | Resp 18 | Ht 64.0 in | Wt 200.0 lb

## 2019-08-09 DIAGNOSIS — R0902 Hypoxemia: Secondary | ICD-10-CM | POA: Diagnosis not present

## 2019-08-09 DIAGNOSIS — J449 Chronic obstructive pulmonary disease, unspecified: Secondary | ICD-10-CM

## 2019-08-09 DIAGNOSIS — R0602 Shortness of breath: Secondary | ICD-10-CM

## 2019-08-09 DIAGNOSIS — R0689 Other abnormalities of breathing: Secondary | ICD-10-CM

## 2019-08-09 MED ORDER — TRELEGY ELLIPTA 100-62.5-25 MCG/INH IN AEPB: 1.0000 | INHALATION_SPRAY | Freq: Every day | RESPIRATORY_TRACT | 3 refills | Status: DC

## 2019-08-09 MED ORDER — FUROSEMIDE 40 MG PO TABS: 40.0000 mg | ORAL_TABLET | Freq: Every day | ORAL | 3 refills | Status: DC

## 2019-08-09 NOTE — Progress Notes (Signed)
Subjective:    Patient ID: [name] [name], female    DOB: 18-Mar-1951, 68 y.o.   MRN: [id number]  06/28/19  Patient states that she is doing much better with regards to her depression after the death of her husband.  She states she feels better.  She actually looks better today.  Her color is better.  She appears to have more energy.  She seems happier and more upbeat.  She is here today to recertify her oxygen.  Patient oxygen level drops to 88% on room air with ambulation greater than 120 feet.  She uses the oxygen only with activity.  Without activity, the patient does not have any shortness of breath.  Earlier this year, the patient was admitted to the hospital with a severe COPD exacerbation and was almost intubated.  She has quit smoking since that time but she occasionally does experience hypoxia with activity and therefore would like to have the oxygen to be used with activity.  She also complains of pain in her right shoulder.  She has pain with abduction greater than 90 degrees.  It hurts and aches to sleep on her shoulder at night.  She has a positive empty can sign and a positive Hawkins sign.  Her blood pressure is also elevated today.  Earlier this year we discontinue doxazosin due to orthostatic hypotension.  However now the patient has more energy and is eating better her blood pressure has risen to 164/58.  She checks it at home and finds it to be 148-150/60.  At that time, my plan was: Patient qualifies for oxygen 2 L with activity.  With room air her oxygen level drops to 88% after walking around the office.  On oxygen, the patient is able to maintain her oxygen saturations greater than 90% on 2 L with activity.  Using sterile technique, I injected the right subacromial space with 2 cc lidocaine, 2 cc of Marcaine, and 2 cc of 40 mg/mL Kenalog.  The patient tolerated the procedure well without complication.  I have recommended the patient resume doxazosin 2 mg p.o. daily for hypertension  and recheck via email her blood pressure in 1 week.  I am so happy that she is doing better with regards to her depression.  We will continue Lexapro at his current dose.  The patient also received her flu shot today.  07/18/19 Thursday, the patient tripped walking into a business and landed on her left shoulder.  She has a large bruise encompassing the entire left bicep.  However the proximal and distal insertion of the bicep tendon is intact.  She has normal elbow flexion without any pain.  She does have some mild tenderness to palpation over the medial body of the biceps where there is the large hematoma however there is no evidence of compartment syndrome.  She does complain of pain in her left shoulder with abduction greater than 85 degrees.  She has weakness with resisted shoulder abduction.  She has a positive empty can sign and pain with Hawkins maneuver suggesting traumatic bursitis although I cannot rule out a tear in the rotator cuff.  Patient continues to report orthostatic dizziness despite being on Cardizem 180 mg a day.  She discontinue doxazosin while I was out of town and increased her Cardizem from 120-180 however she continues to have some dizziness with standing.  This lasts a few seconds and then resolves on its own.  AT that time, my plan was: I believe the patient has  traumatic bursitis in her left shoulder.  I cannot rule out a partial tear in the supraspinatus.  Using sterile technique, I injected the patient's left subacromial space with 2 cc lidocaine, 2 cc of Marcaine, and 2 cc of 40 mg/mL Kenalog.  The patient tolerated the procedure well without complication.  I believe the patient's orthostatic dizziness is multifactorial.  I recommend that she continue the Cardizem at the higher dose 180 mg a day and allow another 7 to 10 days for her body to adjust.  If this does not improve, consider switching back to the original dose of Cardizem which is 120 mg and trying a low-dose  beta-blocker.  07/22/19 Patient presents today complaining of dizziness and lightheadedness.  She states that if she looks straight up, she will feel like she is going to blackout.  She states if she turns her head side to side, she feels like she is losing her balance and that her environment may be spinning.  She feels unsteady driving as she is having a difficult time gauging her distance between the lines and the side of the road.  She reports feeling unsteady on her feet.  She denies any chest pain or shortness of breath however physical exam is significant for bibasilar crackles.  She also has irregularly irregular heart rate.  It sounds like she is having frequent PVCs every third or fourth heart beat.  She does not perceive this.  She does report some vertigo-like symptoms however the majority of her symptoms sound like imbalance and disequilibrium.  Polypharmacy is certainly concern particular given the Requip, the Jardiance, her pain medication, and all of her antihypertensives.  At that time, my plan was: EKG shows normal sinus rhythm with no evidence of ischemia or infarction however it does show frequent PVCs.  Patient appears to be having a PVC every 3rd-4th heartbeat.  This potentially could be contributing some to her lightheadedness.  However I feel the majority of this is due to polypharmacy possibly with some mild vertigo.  We went over her medicine list.  The biggest contributing factors would be the hydrocodone that she takes every 4-5 hours, the Requip that she takes twice a day, the Valium that she takes once a day, and the combination of antihypertensives coupled with Jardiance.  Therefore I have recommended reducing the Requip as much as possible.  She will try to reduce it to once a day primarily at night.  I also recommended reducing the hydrocodone is much as possible.  I have recommended temporarily discontinuing Jardiance in case the diuretic effect is leaving her dehydrated.  I  also recommended reducing her Cardizem back to 120 mg.  We will then recheck next week to see if the patient is feeling better.  I will check a CBC and a CMP to evaluate for any anemia or evidence of dehydration that can contribute to cerebral hypoperfusion.  Also given the abnormal breath sounds I have recommended a chest x-ray.  Recheck next week and see how the patient is feeling.  08/09/19 Patient was seen as a telephone visit on 1221.  She was having significant shortness of breath.  She was started on prednisone and also given Levaquin for a possible COPD exacerbation.  She states that she is doing much better having taken the prednisone however she is still not back to her baseline.  At her last visit I did obtain a chest x-ray given bibasilar crackles however no abnormality was seen on the chest x-ray.  These bibasilar crackles are still present on exam today.  Patient denies any hemoptysis.  She denies any pleurisy.  She does report shortness of breath with activity.  She denies any chest pain.  She denies any angina.  She does have pitting edema in her lower extremities that is +1 to the level of the mid shin.  However her weight has stayed remarkably stable.  She is not on Lasix but she is taking hydrochlorothiazide.  She is also on Stiolto but is still requiring duo nebs every 8 hours to help her breathing.  She sees temporary benefit from the duo nebs.   Past Medical History:  Diagnosis Date  . Anxiety   . Arthritis   . CAD (coronary artery disease)   . Chronic kidney disease    Renal insufficiency  . COPD (chronic obstructive pulmonary disease) ([place])   . Depression   . Diabetes mellitus   . Diverticulosis of colon (without mention of hemorrhage)   . Duodenitis without mention of hemorrhage   . GERD (gastroesophageal reflux disease)   . Hemorrhoids   . Hypercholesteremia   . Hypertension   . Insomnia   . Tinnitus   . Vertigo       Past Surgical History:  Procedure  Laterality Date  . ANGIOPLASTY    . CHOLECYSTECTOMY    . ROTATOR CUFF REPAIR Right 04/2012  . TUBAL LIGATION     Current Outpatient Medications on File Prior to Visit  Medication Sig Dispense Refill  . albuterol (VENTOLIN HFA) 108 (90 Base) MCG/ACT inhaler Inhale 2 puffs into the lungs every 4 (four) hours as needed for wheezing or shortness of breath. 54 g 3  . atorvastatin (LIPITOR) 20 MG tablet Take 1 tablet (20 mg total) by mouth daily. 90 tablet 3  . bimatoprost (LUMIGAN) 0.03 % ophthalmic solution Place 1 drop into both eyes at bedtime.     . Continuous Blood Gluc Sensor (FREESTYLE LIBRE 14 DAY SENSOR) MISC CHANGE EVERY 14 DAYS 2 each 3  . dexlansoprazole (DEXILANT) 60 MG capsule Take 1 capsule (60 mg total) by mouth daily. 90 capsule 3  . diazepam (VALIUM) 5 MG tablet Take 1 tablet (5 mg total) by mouth every 12 (twelve) hours as needed for anxiety (insomnia). 60 tablet 1  . diltiazem (CARDIZEM LA) 180 MG 24 hr tablet Take 1 tablet (180 mg total) by mouth daily. 30 tablet 1  . ergocalciferol (VITAMIN D2) 1.25 MG (50000 UT) capsule Take 1 capsule (50,000 Units total) by mouth once a week. 24 capsule 0  . escitalopram (LEXAPRO) 10 MG tablet Take 1 tablet (10 mg total) by mouth daily. 90 tablet 3  . hydrochlorothiazide (HYDRODIURIL) 25 MG tablet Take 1 tablet (25 mg total) by mouth daily. 90 tablet 1  . HYDROcodone-acetaminophen (NORCO) 10-325 MG per tablet Take 1 tablet by mouth every 6 (six) hours as needed for moderate pain.     Marland Kitchen ipratropium-albuterol (DUONEB) 0.5-2.5 (3) MG/3ML SOLN INHALE 1 VIAL VIA NEBULIZER EVERY 6 HOURS AS NEEDED 360 mL 0  . losartan (COZAAR) 100 MG tablet Take 1 tablet (100 mg total) by mouth daily. 90 tablet 3  . metFORMIN (GLUCOPHAGE) 500 MG tablet Take 1 tablet (500 mg total) by mouth 2 (two) times daily with a meal. (Patient taking differently: Take 1,000 mg by mouth 2 (two) times daily with a meal. ) 60 tablet 0  . OXYGEN Inhale 2 L into the lungs continuous.     Marland Kitchen rOPINIRole (REQUIP) 2 MG  tablet Take 1 tablet (2 mg total) by mouth 3 (three) times daily. (Patient taking differently: Take 2 mg by mouth at bedtime. ) 270 tablet 3  . Tiotropium Bromide-Olodaterol 2.5-2.5 MCG/ACT AERS Inhale 2 puffs into the lungs daily. 12 g 3   No current facility-administered medications on file prior to visit.   Allergies  Allergen Reactions  . Guaifenesin & Derivatives     Severe Reaction  . Benazepril Other (See Comments)    cough  . Latex     REACTION: powder on gloves  . Morphine     REACTION: nausea  . Zyrtec [Cetirizine] Other (See Comments)    swelling   Social History   Socioeconomic History  . Marital status: Widowed    Spouse name: Not on file  . Number of children: 1  . Years of education: Not on file  . Highest education level: Not on file  Occupational History  . Occupation: Retired  Tobacco Use  . Smoking status: Former Smoker    Types: Cigarettes    Quit date: 08/12/2018    Years since quitting: 0.9  . Smokeless tobacco: Never Used  Substance and Sexual Activity  . Alcohol use: No    Alcohol/week: 0.0 standard drinks  . Drug use: No  . Sexual activity: Not on file  Other Topics Concern  . Not on file  Social History Narrative  . Not on file   Social Determinants of Health   Financial Resource Strain:   . Difficulty of Paying Living Expenses: Not on file  Food Insecurity:   . Worried About Charity fundraiser in the Last Year: Not on file  . Ran Out of Food in the Last Year: Not on file  Transportation Needs:   . Lack of Transportation (Medical): Not on file  . Lack of Transportation (Non-Medical): Not on file  Physical Activity:   . Days of Exercise per Week: Not on file  . Minutes of Exercise per Session: Not on file  Stress:   . Feeling of Stress : Not on file  Social Connections:   . Frequency of Communication with Friends and Family: Not on file  . Frequency of Social Gatherings with Friends and Family: Not on  file  . Attends Religious Services: Not on file  . Active Member of Clubs or Organizations: Not on file  . Attends Archivist Meetings: Not on file  . Marital Status: Not on file  Intimate Partner Violence:   . Fear of Current or Ex-Partner: Not on file  . Emotionally Abused: Not on file  . Physically Abused: Not on file  . Sexually Abused: Not on file   Family History  Problem Relation Age of Onset  . Diabetes Mother   . Hemophilia Mother   . Breast cancer Sister   . Heart disease Father   . Colon polyps Father   . Diabetes Brother   . Colon polyps Sister       Review of Systems  Neurological: Positive for dizziness.  All other systems reviewed and are negative.      Objective:   Physical Exam  Constitutional: She is oriented to person, place, and time. She appears well-developed and well-nourished. No distress.  Neck: No JVD present. No thyromegaly present.  Cardiovascular: Normal rate and intact distal pulses. A regularly irregular rhythm present.  Murmur heard. Pulmonary/Chest: Effort normal. No respiratory distress. She has no wheezes. She has rales in the right lower field and the left lower  field.  Abdominal: Soft. Bowel sounds are normal. She exhibits no distension. There is no abdominal tenderness. There is no rebound and no guarding.  Musculoskeletal:        General: + 1edema both legs    Cervical back: Neck supple.  Lymphadenopathy:    She has no cervical adenopathy.  Neurological: She is alert and oriented to person, place, and time. She exhibits normal muscle tone. Coordination normal.  Skin: She is not diaphoretic.  Vitals reviewed.   Wt Readings from Last 3 Encounters:  08/09/19 200 lb (90.7 kg)  07/22/19 200 lb (90.7 kg)  07/18/19 200 lb (90.7 kg)         Assessment & Plan:  Shortness of breath  Abnormal breath sounds  Chronic obstructive pulmonary disease, unspecified COPD type ([place])  Hypoxia  Differential diagnosis for her  shortness of breath includes COPD, diastolic heart failure with peripheral edema and pulmonary edema, or possible underlying pulmonary fibrosis given her underlying abnormal breath sounds.  Less likely would be coronary ischemia given the fact she is not experiencing any chest pain.  I have recommended discontinuation of Stiolto and replacing it with Trelegy to maximize/triple therapy COPD management.  She will use the Trelegy once a day.  I have also recommended discontinuation of hydrochlorothiazide and replacing this with Lasix 40 mg a day given her pitting edema and also what sounds to be bibasilar crackles in her lungs even though the initial chest x-ray did not show any pulmonary edema.  However given her abnormal breath sounds I am concerned about possible interstitial lung disease.  Given the normal chest x-ray, I would schedule the patient for a CT scan of the lungs and if interstitial lung disease is in fact found, I would consult pulmonology.  If the above work-up, is negative, I would recommend cardiology consultation for stress test.  Recheck the patient via telephone in 1 week to see if she improved with diuresis and switching to Trelegy

## 2019-08-16 ENCOUNTER — Telehealth: Payer: Self-pay | Admitting: Family Medicine

## 2019-08-16 MED ORDER — METFORMIN HCL 1000 MG PO TABS: 1000.0000 mg | ORAL_TABLET | Freq: Two times a day (BID) | ORAL | 3 refills | Status: DC

## 2019-08-16 MED ORDER — METFORMIN HCL 1000 MG PO TABS: 1000.0000 mg | ORAL_TABLET | Freq: Two times a day (BID) | ORAL | 1 refills | Status: DC

## 2019-08-16 NOTE — Telephone Encounter (Signed)
Pt called and states that she is feeling much better and that her arm was broken.

## 2019-08-16 NOTE — Telephone Encounter (Signed)
Stay on trelegy once a day and stop stiolto.  Stay off hctz and stay on lasix 40 poqday.  Will need to repeat bmp this week on lasix to ensure it is not affecting her potassium or renal function.

## 2019-08-18 ENCOUNTER — Inpatient Hospital Stay (HOSPITAL_COMMUNITY)
Admission: EM | Admit: 2019-08-18 | Discharge: 2019-08-20 | DRG: 065 | Disposition: A | Discharge status: Home Health | Payer: Medicare Other | Attending: Internal Medicine | Admitting: Internal Medicine

## 2019-08-18 ENCOUNTER — Emergency Department (HOSPITAL_COMMUNITY): Payer: Medicare Other

## 2019-08-18 DIAGNOSIS — I2510 Atherosclerotic heart disease of native coronary artery without angina pectoris: Secondary | ICD-10-CM | POA: Diagnosis present

## 2019-08-18 DIAGNOSIS — J449 Chronic obstructive pulmonary disease, unspecified: Secondary | ICD-10-CM | POA: Diagnosis present

## 2019-08-18 DIAGNOSIS — N182 Chronic kidney disease, stage 2 (mild): Secondary | ICD-10-CM | POA: Diagnosis present

## 2019-08-18 DIAGNOSIS — E7800 Pure hypercholesterolemia, unspecified: Secondary | ICD-10-CM | POA: Diagnosis present

## 2019-08-18 DIAGNOSIS — F329 Major depressive disorder, single episode, unspecified: Secondary | ICD-10-CM | POA: Diagnosis present

## 2019-08-18 DIAGNOSIS — E1165 Type 2 diabetes mellitus with hyperglycemia: Secondary | ICD-10-CM | POA: Diagnosis present

## 2019-08-18 DIAGNOSIS — Z888 Allergy status to other drugs, medicaments and biological substances status: Secondary | ICD-10-CM

## 2019-08-18 DIAGNOSIS — Z6833 Body mass index (BMI) 33.0-33.9, adult: Secondary | ICD-10-CM

## 2019-08-18 DIAGNOSIS — R29703 NIHSS score 3: Secondary | ICD-10-CM | POA: Diagnosis present

## 2019-08-18 DIAGNOSIS — E669 Obesity, unspecified: Secondary | ICD-10-CM | POA: Diagnosis present

## 2019-08-18 DIAGNOSIS — I639 Cerebral infarction, unspecified: Secondary | ICD-10-CM | POA: Diagnosis present

## 2019-08-18 DIAGNOSIS — G4700 Insomnia, unspecified: Secondary | ICD-10-CM | POA: Diagnosis present

## 2019-08-18 DIAGNOSIS — I10 Essential (primary) hypertension: Secondary | ICD-10-CM | POA: Diagnosis not present

## 2019-08-18 DIAGNOSIS — Z803 Family history of malignant neoplasm of breast: Secondary | ICD-10-CM | POA: Diagnosis not present

## 2019-08-18 DIAGNOSIS — K5730 Diverticulosis of large intestine without perforation or abscess without bleeding: Secondary | ICD-10-CM | POA: Diagnosis present

## 2019-08-18 DIAGNOSIS — M1990 Unspecified osteoarthritis, unspecified site: Secondary | ICD-10-CM | POA: Diagnosis present

## 2019-08-18 DIAGNOSIS — J9611 Chronic respiratory failure with hypoxia: Secondary | ICD-10-CM | POA: Diagnosis present

## 2019-08-18 DIAGNOSIS — I63231 Cerebral infarction due to unspecified occlusion or stenosis of right carotid arteries: Secondary | ICD-10-CM | POA: Diagnosis not present

## 2019-08-18 DIAGNOSIS — I959 Hypotension, unspecified: Secondary | ICD-10-CM | POA: Diagnosis not present

## 2019-08-18 DIAGNOSIS — G8194 Hemiplegia, unspecified affecting left nondominant side: Secondary | ICD-10-CM | POA: Diagnosis present

## 2019-08-18 DIAGNOSIS — E1122 Type 2 diabetes mellitus with diabetic chronic kidney disease: Secondary | ICD-10-CM | POA: Diagnosis present

## 2019-08-18 DIAGNOSIS — K219 Gastro-esophageal reflux disease without esophagitis: Secondary | ICD-10-CM | POA: Diagnosis present

## 2019-08-18 DIAGNOSIS — E119 Type 2 diabetes mellitus without complications: Secondary | ICD-10-CM

## 2019-08-18 DIAGNOSIS — I6340 Cerebral infarction due to embolism of unspecified cerebral artery: Secondary | ICD-10-CM | POA: Diagnosis not present

## 2019-08-18 DIAGNOSIS — R270 Ataxia, unspecified: Secondary | ICD-10-CM | POA: Diagnosis not present

## 2019-08-18 DIAGNOSIS — F32A Depression, unspecified: Secondary | ICD-10-CM | POA: Diagnosis present

## 2019-08-18 DIAGNOSIS — G8929 Other chronic pain: Secondary | ICD-10-CM | POA: Diagnosis present

## 2019-08-18 DIAGNOSIS — I129 Hypertensive chronic kidney disease with stage 1 through stage 4 chronic kidney disease, or unspecified chronic kidney disease: Secondary | ICD-10-CM | POA: Diagnosis present

## 2019-08-18 DIAGNOSIS — I6389 Other cerebral infarction: Secondary | ICD-10-CM | POA: Diagnosis not present

## 2019-08-18 DIAGNOSIS — Z8371 Family history of colonic polyps: Secondary | ICD-10-CM | POA: Diagnosis not present

## 2019-08-18 DIAGNOSIS — Z833 Family history of diabetes mellitus: Secondary | ICD-10-CM

## 2019-08-18 DIAGNOSIS — R531 Weakness: Secondary | ICD-10-CM | POA: Diagnosis not present

## 2019-08-18 DIAGNOSIS — Z87891 Personal history of nicotine dependence: Secondary | ICD-10-CM | POA: Diagnosis not present

## 2019-08-18 DIAGNOSIS — Z20822 Contact with and (suspected) exposure to covid-19: Secondary | ICD-10-CM | POA: Diagnosis present

## 2019-08-18 DIAGNOSIS — G4489 Other headache syndrome: Secondary | ICD-10-CM | POA: Diagnosis not present

## 2019-08-18 DIAGNOSIS — Z91040 Latex allergy status: Secondary | ICD-10-CM

## 2019-08-18 DIAGNOSIS — Z7984 Long term (current) use of oral hypoglycemic drugs: Secondary | ICD-10-CM

## 2019-08-18 DIAGNOSIS — Z8249 Family history of ischemic heart disease and other diseases of the circulatory system: Secondary | ICD-10-CM

## 2019-08-18 DIAGNOSIS — Z885 Allergy status to narcotic agent status: Secondary | ICD-10-CM

## 2019-08-18 DIAGNOSIS — E785 Hyperlipidemia, unspecified: Secondary | ICD-10-CM | POA: Diagnosis present

## 2019-08-18 DIAGNOSIS — F419 Anxiety disorder, unspecified: Secondary | ICD-10-CM | POA: Diagnosis present

## 2019-08-18 DIAGNOSIS — Z79891 Long term (current) use of opiate analgesic: Secondary | ICD-10-CM

## 2019-08-18 DIAGNOSIS — Z9981 Dependence on supplemental oxygen: Secondary | ICD-10-CM

## 2019-08-18 DIAGNOSIS — R0902 Hypoxemia: Secondary | ICD-10-CM | POA: Diagnosis not present

## 2019-08-18 DIAGNOSIS — Z7951 Long term (current) use of inhaled steroids: Secondary | ICD-10-CM

## 2019-08-18 LAB — COMPREHENSIVE METABOLIC PANEL
ALT: 32 U/L (ref 0–44)
AST: 24 U/L (ref 15–41)
Albumin: 3.1 g/dL — ABNORMAL LOW (ref 3.5–5.0)
Alkaline Phosphatase: 52 U/L (ref 38–126)
Anion gap: 8 (ref 5–15)
BUN: 16 mg/dL (ref 8–23)
CO2: 25 mmol/L (ref 22–32)
Calcium: 9 mg/dL (ref 8.9–10.3)
Chloride: 102 mmol/L (ref 98–111)
Creatinine, Ser: 0.92 mg/dL (ref 0.44–1.00)
GFR calc Af Amer: >60 mL/min (ref >60)
GFR calc non Af Amer: >60 mL/min (ref >60)
Glucose, Bld: 328 mg/dL — ABNORMAL HIGH (ref 70–99)
Potassium: 3.9 mmol/L (ref 3.5–5.1)
Sodium: 135 mmol/L (ref 135–145)
Total Bilirubin: 0.3 mg/dL (ref 0.3–1.2)
Total Protein: 6.8 g/dL (ref 6.5–8.1)

## 2019-08-18 LAB — CBG MONITORING, ED
Glucose-Capillary: 300 mg/dL — ABNORMAL HIGH (ref 70–99)
Glucose-Capillary: 134 mg/dL — ABNORMAL HIGH (ref 70–99)

## 2019-08-18 LAB — CBC WITH DIFFERENTIAL/PLATELET
Abs Immature Granulocytes: 0.09 10*3/uL — ABNORMAL HIGH (ref 0.00–0.07)
Basophils Absolute: 0 10*3/uL (ref 0.0–0.1)
Basophils Relative: 1 %
Eosinophils Absolute: 0.1 10*3/uL (ref 0.0–0.5)
Eosinophils Relative: 2 %
HCT: 36.8 % (ref 36.0–46.0)
Hemoglobin: 11.9 g/dL — ABNORMAL LOW (ref 12.0–15.0)
Immature Granulocytes: 1 %
Lymphocytes Relative: 28 %
Lymphs Abs: 2.3 10*3/uL (ref 0.7–4.0)
MCH: 28.6 pg (ref 26.0–34.0)
MCHC: 32.3 g/dL (ref 30.0–36.0)
MCV: 88.5 fL (ref 80.0–100.0)
Monocytes Absolute: 0.6 10*3/uL (ref 0.1–1.0)
Monocytes Relative: 7 %
Neutro Abs: 5 10*3/uL (ref 1.7–7.7)
Neutrophils Relative %: 61 %
Platelets: 275 10*3/uL (ref 150–400)
RBC: 4.16 MIL/uL (ref 3.87–5.11)
RDW: 15.5 % (ref 11.5–15.5)
WBC: 8.1 10*3/uL (ref 4.0–10.5)
nRBC: 0 % (ref 0.0–0.2)

## 2019-08-18 LAB — RESPIRATORY PANEL BY RT PCR (FLU A&B, COVID)
Influenza A by PCR: NEGATIVE
Influenza B by PCR: NEGATIVE
SARS Coronavirus 2 by RT PCR: NEGATIVE

## 2019-08-18 LAB — CK: Total CK: 107 U/L (ref 38–234)

## 2019-08-18 MED ORDER — INSULIN ASPART 100 UNIT/ML ~~LOC~~ SOLN: 0.0000 [IU] | Freq: Every day | SUBCUTANEOUS | Status: DC

## 2019-08-18 MED ORDER — ONDANSETRON HCL 4 MG/2ML IJ SOLN: 4.0000 mg | Freq: Four times a day (QID) | INTRAMUSCULAR | Status: DC | PRN

## 2019-08-18 MED ORDER — STROKE: EARLY STAGES OF RECOVERY BOOK
Freq: Once | Status: AC
  Filled 2019-08-18: qty 1

## 2019-08-18 MED ORDER — ACETAMINOPHEN 650 MG RE SUPP: 650.0000 mg | Freq: Four times a day (QID) | RECTAL | Status: DC | PRN

## 2019-08-18 MED ORDER — ONDANSETRON HCL 4 MG PO TABS: 4.0000 mg | ORAL_TABLET | Freq: Four times a day (QID) | ORAL | Status: DC | PRN

## 2019-08-18 MED ORDER — ACETAMINOPHEN 325 MG PO TABS
650.0000 mg | ORAL_TABLET | Freq: Four times a day (QID) | ORAL | Status: DC | PRN
  Administered 2019-08-19: 10:00:00 650 mg via ORAL
  Filled 2019-08-18: qty 2

## 2019-08-18 MED ORDER — ROPINIROLE HCL 1 MG PO TABS
2.0000 mg | ORAL_TABLET | Freq: Every day | ORAL | Status: DC
  Administered 2019-08-19 (×2): 2 mg via ORAL
  Filled 2019-08-18 (×2): qty 2

## 2019-08-18 MED ORDER — HYDROCODONE-ACETAMINOPHEN 10-325 MG PO TABS
1.0000 | ORAL_TABLET | Freq: Once | ORAL | Status: AC
  Administered 2019-08-18: 23:00:00 1 via ORAL
  Filled 2019-08-18: qty 1

## 2019-08-18 MED ORDER — INSULIN ASPART 100 UNIT/ML ~~LOC~~ SOLN
0.0000 [IU] | Freq: Three times a day (TID) | SUBCUTANEOUS | Status: DC
  Administered 2019-08-19: 09:00:00 3 [IU] via SUBCUTANEOUS
  Administered 2019-08-19: 2 [IU] via SUBCUTANEOUS
  Administered 2019-08-19: 19:00:00 5 [IU] via SUBCUTANEOUS
  Administered 2019-08-20: 07:00:00 3 [IU] via SUBCUTANEOUS

## 2019-08-18 MED ORDER — SODIUM CHLORIDE 0.9 % IV BOLUS
1000.0000 mL | Freq: Once | INTRAVENOUS | Status: AC
  Administered 2019-08-19: 03:00:00 1000 mL via INTRAVENOUS

## 2019-08-18 NOTE — ED Notes (Signed)
1732 hrs stat MRI, off floor pt. Oriented and alert, vitals stable

## 2019-08-18 NOTE — ED Notes (Signed)
Pt. Still off floor for imaging

## 2019-08-18 NOTE — ED Notes (Signed)
Patient back from CT.

## 2019-08-18 NOTE — H&P (Signed)
History and Physical    [name] [name] [id number] DOB: 07/09/1951 DOA: 08/18/2019  PCP: Susy Frizzle, MD   Patient coming from: Home.  I have personally briefly reviewed patient's old medical records in [place]  Chief Complaint: Left-sided weakness.  HPI: [name] [name] is a 69 y.o. female with medical history significant of anxiety, osteoarthritis, CAD, stage II chronic kidney disease, COPD, depression, type 2 diabetes, diverticulosis, history of duodenitis, GERD, hemorrhoids, hyperlipidemia, hypertension, insomnia, tinnitus, vertigo who is coming to the emergency department due to left-sided weakness since about 10 in the morning yesterday after her her appointment.  She states that she went home and used a cane.  However, she noticed today that her weakness was getting worse so she called her doctor office, who advised her to call 911 and come to the ED.  She denies headache, dizziness, blurred vision, slurred speech and has not trouble comprehending spoken language.  She denies fever, chills, sore throat, rhinorrhea, wheezing, hemoptysis, dyspnea, chest pain, palpitations, diaphoresis, PND, orthopnea, but states that she has frequent lower extremity edema.  No abdominal pain, diarrhea, constipation, melena or hematochezia.  No dysuria, frequency or hematuria.  She mentions that she had a fall around Thanksgiving injuring and having a fracture of her left shoulder.  ED Course: Initial vital signs were temperature 98.4 F, pulse 76, respirations 16, blood pressure 170/65 mmHg and O2 sat 95% on room air.    She is slightly anemic with a hemoglobin of 11.9 g/dL, glucose was 328 mg/dL and albumin 3.1 g/dL.  The rest of the labs are within normal limits.  Imaging: non contrast MRI of brain shows multifocal acute ischemia within both hemispheres, right worse than left, predominantly in the watershed distribution.  There was no hemorrhage or mass-effect.  There is chronic ischemic  angiopathy.  Please see images and full radiology reports for further detail.  Review of Systems: As per HPI otherwise 10 point review of systems negative.   Past Medical History:  Diagnosis Date  . Anxiety   . Arthritis   . CAD (coronary artery disease)   . Chronic kidney disease    Renal insufficiency  . COPD (chronic obstructive pulmonary disease) ([place])   . Depression   . Diabetes mellitus   . Diverticulosis of colon (without mention of hemorrhage)   . Duodenitis without mention of hemorrhage   . GERD (gastroesophageal reflux disease)   . Hemorrhoids   . Hypercholesteremia   . Hypertension   . Insomnia   . Tinnitus   . Vertigo     Past Surgical History:  Procedure Laterality Date  . ANGIOPLASTY    . CHOLECYSTECTOMY    . ROTATOR CUFF REPAIR Right 04/2012  . TUBAL LIGATION       reports that she quit smoking about a year ago. Her smoking use included cigarettes. She has never used smokeless tobacco. She reports that she does not drink alcohol or use drugs.  Allergies  Allergen Reactions  . Guaifenesin & Derivatives     Severe Reaction  . Benazepril Other (See Comments)    cough  . Latex     REACTION: powder on gloves  . Morphine     REACTION: nausea  . Zyrtec [Cetirizine] Other (See Comments)    swelling    Family History  Problem Relation Age of Onset  . Diabetes Mother   . Hemophilia Mother   . Breast cancer Sister   . Heart disease Father   . Colon  polyps Father   . Diabetes Brother   . Colon polyps Sister    Prior to Admission medications   Medication Sig Start Date End Date Taking? Authorizing Provider  albuterol (VENTOLIN HFA) 108 (90 Base) MCG/ACT inhaler Inhale 2 puffs into the lungs every 4 (four) hours as needed for wheezing or shortness of breath. 06/07/19   Susy Frizzle, MD  atorvastatin (LIPITOR) 20 MG tablet Take 1 tablet (20 mg total) by mouth daily. 04/29/19   Susy Frizzle, MD  bimatoprost (LUMIGAN) 0.03 % ophthalmic solution  Place 1 drop into both eyes at bedtime.     [provider]  Continuous Blood Gluc Sensor (FREESTYLE LIBRE 14 DAY SENSOR) MISC CHANGE EVERY 14 DAYS 07/26/19   Susy Frizzle, MD  dexlansoprazole (DEXILANT) 60 MG capsule Take 1 capsule (60 mg total) by mouth daily. 04/27/19   Susy Frizzle, MD  diazepam (VALIUM) 5 MG tablet Take 1 tablet (5 mg total) by mouth every 12 (twelve) hours as needed for anxiety (insomnia). 06/07/19   Susy Frizzle, MD  diltiazem (CARDIZEM LA) 180 MG 24 hr tablet Take 1 tablet (180 mg total) by mouth daily. 07/04/19   Alycia Rossetti, MD  ergocalciferol (VITAMIN D2) 1.25 MG (50000 UT) capsule Take 1 capsule (50,000 Units total) by mouth once a week. 05/24/19   Susy Frizzle, MD  escitalopram (LEXAPRO) 10 MG tablet Take 1 tablet (10 mg total) by mouth daily. 06/07/19   Susy Frizzle, MD  Fluticasone-Umeclidin-Vilant (TRELEGY ELLIPTA) 100-62.5-25 MCG/INH AEPB Inhale 1 Inhaler into the lungs daily. This replaces stiolto 08/09/19   Susy Frizzle, MD  furosemide (LASIX) 40 MG tablet Take 1 tablet (40 mg total) by mouth daily. 08/09/19   Susy Frizzle, MD  hydrochlorothiazide (HYDRODIURIL) 25 MG tablet Take 1 tablet (25 mg total) by mouth daily. 06/07/19   Susy Frizzle, MD  HYDROcodone-acetaminophen (NORCO) 10-325 MG per tablet Take 1 tablet by mouth every 6 (six) hours as needed for moderate pain.     [provider]  ipratropium-albuterol (DUONEB) 0.5-2.5 (3) MG/3ML SOLN INHALE 1 VIAL VIA NEBULIZER EVERY 6 HOURS AS NEEDED 10/25/18   Susy Frizzle, MD  losartan (COZAAR) 100 MG tablet Take 1 tablet (100 mg total) by mouth daily. 04/27/19   Susy Frizzle, MD  metFORMIN (GLUCOPHAGE) 1000 MG tablet Take 1 tablet (1,000 mg total) by mouth 2 (two) times daily with a meal. 08/16/19   Susy Frizzle, MD  OXYGEN Inhale 2 L into the lungs continuous.    [provider]  rOPINIRole (REQUIP) 2 MG tablet Take 1 tablet (2 mg  total) by mouth 3 (three) times daily. Patient taking differently: Take 2 mg by mouth at bedtime.  04/27/19   Susy Frizzle, MD  Tiotropium Bromide-Olodaterol 2.5-2.5 MCG/ACT AERS Inhale 2 puffs into the lungs daily. 06/07/19   Susy Frizzle, MD    Physical Exam: Vitals:   08/18/19 1830 08/18/19 1900 08/18/19 2223 08/18/19 2240  BP:   97/80 (!) 91/58  Pulse:  73 78 85  Resp: 14 18 (!) 22 19  Temp:      SpO2:  94% 97% 95%  Weight:      Height:        Constitutional: NAD, calm, comfortable Eyes: PERRL, lids and conjunctivae normal ENMT: Mucous membranes are moist. Posterior pharynx clear of any exudate or lesions. Neck: normal, supple, no masses, no thyromegaly Respiratory: Decreased breath sounds in bases, otherwise  clear to auscultation bilaterally, no wheezing, no crackles. Normal respiratory effort. No accessory muscle use.  Cardiovascular: Regular rate and rhythm, no murmurs / rubs / gallops. No extremity edema. 2+ pedal pulses. No carotid bruits.  Abdomen: Nondistended.  Obese.  Soft, no tenderness, no masses palpated. No hepatosplenomegaly. Bowel sounds positive.  Musculoskeletal: no clubbing / cyanosis.  LUE/shoulder decreased ROM, no contractures. Normal muscle tone.  Skin: no rashes, lesions, ulcers on limited dermatological examination. Neurologic: CN 2-12 grossly intact. Sensation intact, DTR normal. 4/5 left sided hemiparesis. Psychiatric: Normal judgment and insight. Alert and oriented x 3. Normal mood.   Labs on Admission: I have personally reviewed following labs and imaging studies  CBC: Recent Labs  Lab 08/18/19 1542  WBC 8.1  NEUTROABS 5.0  HGB 11.9*  HCT 36.8  MCV 88.5  PLT 123XX123   Basic Metabolic Panel: Recent Labs  Lab 08/18/19 1542  NA 135  K 3.9  CL 102  CO2 25  GLUCOSE 328*  BUN 16  CREATININE 0.92  CALCIUM 9.0   GFR: Estimated Creatinine Clearance: 61.1 mL/min (by C-G formula based on SCr of 0.92 mg/dL). Liver Function  Tests: Recent Labs  Lab 08/18/19 1542  AST 24  ALT 32  ALKPHOS 52  BILITOT 0.3  PROT 6.8  ALBUMIN 3.1*   No results for input(s): LIPASE, AMYLASE in the last 168 hours. No results for input(s): AMMONIA in the last 168 hours. Coagulation Profile: No results for input(s): INR, PROTIME in the last 168 hours. Cardiac Enzymes: Recent Labs  Lab 08/18/19 1945  CKTOTAL 107   BNP (last 3 results) No results for input(s): PROBNP in the last 8760 hours. HbA1C: No results for input(s): HGBA1C in the last 72 hours. CBG: Recent Labs  Lab 08/18/19 1549  GLUCAP 300*   Lipid Profile: No results for input(s): CHOL, HDL, LDLCALC, TRIG, CHOLHDL, LDLDIRECT in the last 72 hours. Thyroid Function Tests: No results for input(s): TSH, T4TOTAL, FREET4, T3FREE, THYROIDAB in the last 72 hours. Anemia Panel: No results for input(s): VITAMINB12, FOLATE, FERRITIN, TIBC, IRON, RETICCTPCT in the last 72 hours. Urine analysis: No results found for: COLORURINE, APPEARANCEUR, [place], [place], GLUCOSEU, [place], BILIRUBINUR, KETONESUR, PROTEINUR, [place], NITRITE, LEUKOCYTESUR  Radiological Exams on Admission: DG Chest 2 View  Result Date: 08/18/2019 CLINICAL DATA:  Weakness EXAM: CHEST - 2 VIEW COMPARISON:  07/22/2019 FINDINGS: Cardiomegaly. Both lungs are clear. Disc degenerative disease of the thoracic spine. IMPRESSION: Cardiomegaly without acute abnormality of the lungs. Electronically Signed   By: Eddie Candle M.D.   On: 08/18/2019 16:27   CT Head Wo Contrast  Result Date: 08/18/2019 CLINICAL DATA:  Left-sided weakness for several hours EXAM: CT HEAD WITHOUT CONTRAST TECHNIQUE: Contiguous axial images were obtained from the base of the skull through the vertex without intravenous contrast. COMPARISON:  06/12/2005 FINDINGS: Brain: Mild atrophic changes are noted. No findings to suggest acute hemorrhage, acute infarction or space-occupying mass lesion are noted. Vascular: No hyperdense vessel or  unexpected calcification. Skull: Normal. Negative for fracture or focal lesion. Sinuses/Orbits: No acute finding. Other: None. IMPRESSION: Mild atrophic changes without acute abnormality. Electronically Signed   By: Inez Catalina M.D.   On: 08/18/2019 18:50   MR Brain Wo Contrast (neuro protocol)  Result Date: 08/18/2019 CLINICAL DATA:  Ataxia EXAM: MRI HEAD WITHOUT CONTRAST TECHNIQUE: Multiplanar, multiecho pulse sequences of the brain and surrounding structures were obtained without intravenous contrast. COMPARISON:  Head CT 08/18/2019 and brain MRI 06/12/2005 FINDINGS: Brain: There is multifocal acute ischemia within  both hemispheres, predominantly in a watershed distribution. There is no hemorrhage or mass effect. Multifocal white matter hyperintensity, most commonly due to chronic ischemic microangiopathy. The cerebral and cerebellar volume are age-appropriate. There is no hydrocephalus. Blood-sensitive sequences show no chronic microhemorrhage or superficial siderosis. The midline structures are normal. Vascular: Normal flow voids. Skull and upper cervical spine: Normal marrow signal. Sinuses/Orbits: Negative. Other: None. IMPRESSION: 1. Multifocal acute ischemia within both hemispheres, right worse than left, predominantly in a watershed distribution. No hemorrhage or mass effect. 2. Chronic ischemic microangiopathy. Electronically Signed   By: Ulyses Jarred M.D.   On: 08/18/2019 22:43   08/13/2017 echo ------------------------------------------------------------------- LV EF: 65% -   70%  ------------------------------------------------------------------- Indications:      R06.09 Dyspnea.  ------------------------------------------------------------------- History:   PMH:  Acquired from the patient and from the patient&'s chart.  PMH:  Coronary artery disease. Chronic kidney disease. Risk factors:  Hypertension. Diabetes  mellitus. Hypercholesterolemia.  ------------------------------------------------------------------- Study Conclusions  - Left ventricle: The cavity size was normal. Systolic function was   vigorous. The estimated ejection fraction was in the range of 65%   to 70%. Wall motion was normal; there were no regional wall   motion abnormalities. Doppler parameters are consistent with   abnormal left ventricular relaxation (grade 1 diastolic   dysfunction). There was no evidence of elevated ventricular   filling pressure by Doppler parameters. - Aortic valve: There was no regurgitation. - Mitral valve: There was trivial regurgitation. - Right ventricle: The cavity size was normal. Wall thickness was   normal. Systolic function was normal. - Right atrium: The atrium was normal in size. - Tricuspid valve: There was trivial regurgitation. - Pulmonary arteries: Systolic pressure was within the normal   range. - Inferior vena cava: The vessel was normal in size. - Pericardium, extracardiac: There was no pericardial effusion.  EKG: Independently reviewed.  Vent. rate 74 BPM PR interval * ms QRS duration 86 ms QT/QTc 410/455 ms P-R-T axes 79 81 79 Sinus rhythm Consider right atrial enlargement Borderline right axis deviation  Assessment/Plan Principal Problem:   Acute nonhemorrhagic cerebrovascular accident (CVA) ([place]) Admit to telemetry/inpatient. Frequent neuro checks. Consult PT/OT/SLP. Check fasting lipids and hemoglobin A1c. Check carotid Doppler and echocardiogram. Allow permissive hypertension. Close glucose monitoring/hyperglycemia treatment. Neuro hospitalist team is consulting.  Active Problems:   Hypertension Allow permissive hypertension. Hold antihypertensives. NS bolus now due to hypotension with SBP in the 90s.    Type 2 diabetes mellitus ([facility]) Carbohydrate modified diet. Continue metformin 1000 mg p.o. twice daily. Check hemoglobin A1c. CBG monitoring with  RI SS. Normal saline and night coverage for hyperglycemia now    Coronary atherosclerosis Antiplatelet therapy. Continue atorvastatin. On diltiazem due to COPD.    Depression Continue antidepressant.    GERD (gastroesophageal reflux disease) Continue PPI.    Hypercholesteremia Continue atorvastatin. Check fasting lipids.    COPD (chronic obstructive pulmonary disease) (HCC) Continue Spiriva. Continue Trelegy Ellipta. Supplemental oxygen and bronchodilators as needed.   DVT prophylaxis: SCDs. Code Status: Full code. Family Communication: Disposition Plan: Admit for acute CVA work-up and monitoring. Consults called: The neuro hospitalist team is consulting. Admission status: Inpatient/telemetry.   Reubin Milan MD [facility]  If 7PM-7AM, please contact night-coverage [email or web address]  08/18/2019, 11:14 PM   This document was prepared using Dragon voice recognition software and may contain some unintended transcription errors.

## 2019-08-18 NOTE — ED Notes (Signed)
Pt transported to CT ?

## 2019-08-18 NOTE — ED Notes (Signed)
Pt refuses to wear the BP Cuff on her left arm. Her right arm does not give an accurate BP.

## 2019-08-18 NOTE — ED Triage Notes (Signed)
Pt came in [facility] with c/o of Left Sided Weakness that started approx at 1030 yesterday after her hair appointment. Pt is now using a cane for ambultion d/t weakness. Pt also experienced a fall after thanksgiving and per her orthopedic, she had a small break in the Left Shoulder.  [id number]

## 2019-08-18 NOTE — Telephone Encounter (Signed)
Called to speak to pt and son informed me that EMS just came and got her about 30 minutes ago d/t stroke symptoms.

## 2019-08-18 NOTE — ED Provider Notes (Signed)
[facility] EMERGENCY DEPARTMENT Provider Note   CSN: [id number] Arrival date & time: 08/18/19  1528     History Chief Complaint  Patient presents with  . Left Sided Weakness    [name] [name] is a 69 y.o. female.  HPI Patient presents to the emergency department with weakness on her left side. The patient states she has had difficulty with walking as well over the last 24 hours. Patient states that the symptoms started yesterday and she has been using a cane to help her ambulate. Patient states that she had a fall several weeks ago but had no lingering effects from this. Patient states that she noticed the symptoms started yesterday morning around 1030. The patient denies chest pain, shortness of breath, headache,blurred vision, neck pain, fever, cough,dizziness, anorexia, edema, abdominal pain, nausea, vomiting, diarrhea, rash, back pain, dysuria, hematemesis, bloody stool, near syncope, or syncope.    Past Medical History:  Diagnosis Date  . Anxiety   . Arthritis   . CAD (coronary artery disease)   . Chronic kidney disease    Renal insufficiency  . COPD (chronic obstructive pulmonary disease) ([place])   . Depression   . Diabetes mellitus   . Diverticulosis of colon (without mention of hemorrhage)   . Duodenitis without mention of hemorrhage   . GERD (gastroesophageal reflux disease)   . Hemorrhoids   . Hypercholesteremia   . Hypertension   . Insomnia   . Tinnitus   . Vertigo     Patient Active Problem List   Diagnosis Date Noted  . COPD (chronic obstructive pulmonary disease) ([place])   . COPD exacerbation ([place]) 08/12/2018  . Hyponatremia 08/12/2018  . Acute respiratory failure with hypoxia ([place]) 10/26/2015  . CAP (community acquired pneumonia) 10/26/2015  . Diabetes ([place]) 10/26/2015  . Overactive bladder 09/10/2015  . Diabetes mellitus type 2, controlled, without complications ([place])   . Depression   . Insomnia   . GERD (gastroesophageal reflux  disease)   . Hypercholesteremia   . Hypertension   . Chronic kidney disease   . Overweight 10/29/2009  . GERD 06/20/2008  . CHEST PAIN 06/20/2008  . CHOLECYSTECTOMY, HX OF 06/20/2008  . HYPERLIPIDEMIA 12/11/2007  . TOBACCO ABUSE 12/11/2007  . DEPRESSION 12/11/2007  . Essential hypertension 12/11/2007  . Coronary atherosclerosis 12/11/2007  . DUODENITIS, WITH HEMORRHAGE 12/11/2007    Past Surgical History:  Procedure Laterality Date  . ANGIOPLASTY    . CHOLECYSTECTOMY    . ROTATOR CUFF REPAIR Right 04/2012  . TUBAL LIGATION       OB History   No obstetric history on file.     Family History  Problem Relation Age of Onset  . Diabetes Mother   . Hemophilia Mother   . Breast cancer Sister   . Heart disease Father   . Colon polyps Father   . Diabetes Brother   . Colon polyps Sister     Social History   Tobacco Use  . Smoking status: Former Smoker    Types: Cigarettes    Quit date: 08/12/2018    Years since quitting: 1.0  . Smokeless tobacco: Never Used  Substance Use Topics  . Alcohol use: No    Alcohol/week: 0.0 standard drinks  . Drug use: No    Home Medications Prior to Admission medications   Medication Sig Start Date End Date Taking? Authorizing Provider  albuterol (VENTOLIN HFA) 108 (90 Base) MCG/ACT inhaler Inhale 2 puffs into the lungs every 4 (four) hours as needed  for wheezing or shortness of breath. 06/07/19   Susy Frizzle, MD  atorvastatin (LIPITOR) 20 MG tablet Take 1 tablet (20 mg total) by mouth daily. 04/29/19   Susy Frizzle, MD  bimatoprost (LUMIGAN) 0.03 % ophthalmic solution Place 1 drop into both eyes at bedtime.     [provider]  Continuous Blood Gluc Sensor (FREESTYLE LIBRE 14 DAY SENSOR) MISC CHANGE EVERY 14 DAYS 07/26/19   Susy Frizzle, MD  dexlansoprazole (DEXILANT) 60 MG capsule Take 1 capsule (60 mg total) by mouth daily. 04/27/19   Susy Frizzle, MD  diazepam (VALIUM) 5 MG tablet Take 1 tablet (5 mg total)  by mouth every 12 (twelve) hours as needed for anxiety (insomnia). 06/07/19   Susy Frizzle, MD  diltiazem (CARDIZEM LA) 180 MG 24 hr tablet Take 1 tablet (180 mg total) by mouth daily. 07/04/19   Alycia Rossetti, MD  ergocalciferol (VITAMIN D2) 1.25 MG (50000 UT) capsule Take 1 capsule (50,000 Units total) by mouth once a week. 05/24/19   Susy Frizzle, MD  escitalopram (LEXAPRO) 10 MG tablet Take 1 tablet (10 mg total) by mouth daily. 06/07/19   Susy Frizzle, MD  Fluticasone-Umeclidin-Vilant (TRELEGY ELLIPTA) 100-62.5-25 MCG/INH AEPB Inhale 1 Inhaler into the lungs daily. This replaces stiolto 08/09/19   Susy Frizzle, MD  furosemide (LASIX) 40 MG tablet Take 1 tablet (40 mg total) by mouth daily. 08/09/19   Susy Frizzle, MD  hydrochlorothiazide (HYDRODIURIL) 25 MG tablet Take 1 tablet (25 mg total) by mouth daily. 06/07/19   Susy Frizzle, MD  HYDROcodone-acetaminophen (NORCO) 10-325 MG per tablet Take 1 tablet by mouth every 6 (six) hours as needed for moderate pain.     [provider]  ipratropium-albuterol (DUONEB) 0.5-2.5 (3) MG/3ML SOLN INHALE 1 VIAL VIA NEBULIZER EVERY 6 HOURS AS NEEDED 10/25/18   Susy Frizzle, MD  losartan (COZAAR) 100 MG tablet Take 1 tablet (100 mg total) by mouth daily. 04/27/19   Susy Frizzle, MD  metFORMIN (GLUCOPHAGE) 1000 MG tablet Take 1 tablet (1,000 mg total) by mouth 2 (two) times daily with a meal. 08/16/19   Susy Frizzle, MD  OXYGEN Inhale 2 L into the lungs continuous.    [provider]  rOPINIRole (REQUIP) 2 MG tablet Take 1 tablet (2 mg total) by mouth 3 (three) times daily. Patient taking differently: Take 2 mg by mouth at bedtime.  04/27/19   Susy Frizzle, MD  Tiotropium Bromide-Olodaterol 2.5-2.5 MCG/ACT AERS Inhale 2 puffs into the lungs daily. 06/07/19   Susy Frizzle, MD    Allergies    Guaifenesin & derivatives, Benazepril, Latex, Morphine, and Zyrtec [cetirizine]  Review of  Systems   Review of Systems All other systems negative except as documented in the HPI. All pertinent positives and negatives as reviewed in the HPI. Physical Exam Updated Vital Signs BP (!) 91/58 (BP Location: Left Wrist)   Pulse 85   Temp 98.4 F (36.9 C)   Resp 19   Ht 5\' 4"  (1.626 m)   Wt 88 kg   SpO2 95%   BMI 33.30 kg/m   Physical Exam Vitals and nursing note reviewed.  Constitutional:      General: She is not in acute distress.    Appearance: She is well-developed.  HENT:     Head: Normocephalic and atraumatic.  Eyes:     Pupils: Pupils are equal, round, and reactive to light.  Cardiovascular:  Rate and Rhythm: Normal rate and regular rhythm.     Heart sounds: Normal heart sounds. No murmur. No friction rub. No gallop.   Pulmonary:     Effort: Pulmonary effort is normal. No respiratory distress.     Breath sounds: Normal breath sounds. No wheezing.  Abdominal:     General: Bowel sounds are normal. There is no distension.     Palpations: Abdomen is soft.     Tenderness: There is no abdominal tenderness.  Musculoskeletal:     Cervical back: Normal range of motion and neck supple.  Skin:    General: Skin is warm and dry.     Capillary Refill: Capillary refill takes less than 2 seconds.     Findings: No erythema or rash.  Neurological:     Mental Status: She is alert and oriented to person, place, and time.     Motor: Weakness present. No abnormal muscle tone.     Coordination: Coordination normal.     Gait: Gait abnormal.  Psychiatric:        Behavior: Behavior normal.     ED Results / Procedures / Treatments   Labs (all labs ordered are listed, but only abnormal results are displayed) Labs Reviewed  COMPREHENSIVE METABOLIC PANEL - Abnormal; Notable for the following components:      Result Value   Glucose, Bld 328 (*)    Albumin 3.1 (*)    All other components within normal limits  CBC WITH DIFFERENTIAL/PLATELET - Abnormal; Notable for the following  components:   Hemoglobin 11.9 (*)    Abs Immature Granulocytes 0.09 (*)    All other components within normal limits  CBG MONITORING, ED - Abnormal; Notable for the following components:   Glucose-Capillary 300 (*)    All other components within normal limits  RESPIRATORY PANEL BY RT PCR (FLU A&B, COVID)  CK    EKG EKG Interpretation  Date/Time:  Thursday August 18 2019 15:40:38 EST Ventricular Rate:  74 PR Interval:    QRS Duration: 86 QT Interval:  410 QTC Calculation: 455 R Axis:   81 Text Interpretation: Sinus rhythm Consider right atrial enlargement Borderline right axis deviation Confirmed by Gerlene Fee [phone]) on 08/18/2019 4:25:07 PM   Radiology DG Chest 2 View  Result Date: 08/18/2019 CLINICAL DATA:  Weakness EXAM: CHEST - 2 VIEW COMPARISON:  07/22/2019 FINDINGS: Cardiomegaly. Both lungs are clear. Disc degenerative disease of the thoracic spine. IMPRESSION: Cardiomegaly without acute abnormality of the lungs. Electronically Signed   By: Eddie Candle M.D.   On: 08/18/2019 16:27   CT Head Wo Contrast  Result Date: 08/18/2019 CLINICAL DATA:  Left-sided weakness for several hours EXAM: CT HEAD WITHOUT CONTRAST TECHNIQUE: Contiguous axial images were obtained from the base of the skull through the vertex without intravenous contrast. COMPARISON:  06/12/2005 FINDINGS: Brain: Mild atrophic changes are noted. No findings to suggest acute hemorrhage, acute infarction or space-occupying mass lesion are noted. Vascular: No hyperdense vessel or unexpected calcification. Skull: Normal. Negative for fracture or focal lesion. Sinuses/Orbits: No acute finding. Other: None. IMPRESSION: Mild atrophic changes without acute abnormality. Electronically Signed   By: Inez Catalina M.D.   On: 08/18/2019 18:50   MR Brain Wo Contrast (neuro protocol)  Result Date: 08/18/2019 CLINICAL DATA:  Ataxia EXAM: MRI HEAD WITHOUT CONTRAST TECHNIQUE: Multiplanar, multiecho pulse sequences of the brain and  surrounding structures were obtained without intravenous contrast. COMPARISON:  Head CT 08/18/2019 and brain MRI 06/12/2005 FINDINGS: Brain: There is multifocal acute ischemia  within both hemispheres, predominantly in a watershed distribution. There is no hemorrhage or mass effect. Multifocal white matter hyperintensity, most commonly due to chronic ischemic microangiopathy. The cerebral and cerebellar volume are age-appropriate. There is no hydrocephalus. Blood-sensitive sequences show no chronic microhemorrhage or superficial siderosis. The midline structures are normal. Vascular: Normal flow voids. Skull and upper cervical spine: Normal marrow signal. Sinuses/Orbits: Negative. Other: None. IMPRESSION: 1. Multifocal acute ischemia within both hemispheres, right worse than left, predominantly in a watershed distribution. No hemorrhage or mass effect. 2. Chronic ischemic microangiopathy. Electronically Signed   By: Ulyses Jarred M.D.   On: 08/18/2019 22:43    Procedures Procedures (including critical care time)  Medications Ordered in ED Medications - No data to display  ED Course  I have reviewed the triage vital signs and the nursing notes.  Pertinent labs & imaging results that were available during my care of the patient were reviewed by me and considered in my medical decision making (see chart for details).    MDM Rules/Calculators/A&P                     Spoke with the patient about her results and the need for admission. The patient agrees this plan all questions were answered. Did speak with Dr. Leonel Ramsay from neurology who will evaluate the patient. Final Clinical Impression(s) / ED Diagnoses Final diagnoses:  Cerebrovascular accident (CVA), unspecified mechanism [facility])    Rx / [place] Orders ED Discharge Orders    None       Dalia Heading, PA-C 08/18/19 2305    Maudie Flakes, MD 08/19/19 1954

## 2019-08-18 NOTE — ED Notes (Signed)
Back on floor at approx 2200

## 2019-08-19 ENCOUNTER — Other Ambulatory Visit: Payer: Self-pay

## 2019-08-19 ENCOUNTER — Encounter (HOSPITAL_COMMUNITY): Payer: Self-pay | Admitting: Internal Medicine

## 2019-08-19 ENCOUNTER — Inpatient Hospital Stay (HOSPITAL_COMMUNITY): Payer: Medicare Other

## 2019-08-19 ENCOUNTER — Ambulatory Visit: Payer: Medicare Other | Admitting: Family Medicine

## 2019-08-19 ENCOUNTER — Other Ambulatory Visit: Payer: Self-pay | Admitting: Family Medicine

## 2019-08-19 DIAGNOSIS — I6389 Other cerebral infarction: Secondary | ICD-10-CM

## 2019-08-19 DIAGNOSIS — I639 Cerebral infarction, unspecified: Secondary | ICD-10-CM

## 2019-08-19 LAB — LIPID PANEL
Cholesterol: 136 mg/dL (ref 0–200)
HDL: 53 mg/dL (ref >40)
LDL Cholesterol: 69 mg/dL (ref 0–99)
Total CHOL/HDL Ratio: 2.6 RATIO
Triglycerides: 70 mg/dL (ref <150)
VLDL: 14 mg/dL (ref 0–40)

## 2019-08-19 LAB — CBG MONITORING, ED
Glucose-Capillary: 146 mg/dL — ABNORMAL HIGH (ref 70–99)
Glucose-Capillary: 193 mg/dL — ABNORMAL HIGH (ref 70–99)
Glucose-Capillary: 139 mg/dL — ABNORMAL HIGH (ref 70–99)
Glucose-Capillary: 226 mg/dL — ABNORMAL HIGH (ref 70–99)
Glucose-Capillary: 154 mg/dL — ABNORMAL HIGH (ref 70–99)

## 2019-08-19 LAB — ECHOCARDIOGRAM COMPLETE
Height: 64 in
Weight: 3104 oz

## 2019-08-19 LAB — HEMOGLOBIN A1C
Hgb A1c MFr Bld: 9.4 % — ABNORMAL HIGH (ref 4.8–5.6)
Mean Plasma Glucose: 223.08 mg/dL

## 2019-08-19 LAB — HIV ANTIBODY (ROUTINE TESTING W REFLEX): HIV Screen 4th Generation wRfx: NONREACTIVE

## 2019-08-19 MED ORDER — METFORMIN HCL 1000 MG PO TABS: 1000.0000 mg | ORAL_TABLET | Freq: Two times a day (BID) | ORAL | 1 refills | Status: DC

## 2019-08-19 MED ORDER — VITAMIN D (ERGOCALCIFEROL) 1.25 MG (50000 UNIT) PO CAPS: 50000.0000 [IU] | ORAL_CAPSULE | ORAL | Status: DC

## 2019-08-19 MED ORDER — LATANOPROST 0.005 % OP SOLN
1.0000 [drp] | Freq: Every day | OPHTHALMIC | Status: DC
  Administered 2019-08-19: 1 [drp] via OPHTHALMIC
  Filled 2019-08-19: qty 2.5

## 2019-08-19 MED ORDER — HYDROCODONE-ACETAMINOPHEN 5-325 MG PO TABS
1.0000 | ORAL_TABLET | Freq: Four times a day (QID) | ORAL | Status: DC | PRN
  Administered 2019-08-19 – 2019-08-20 (×3): 1 via ORAL
  Filled 2019-08-19 (×3): qty 1

## 2019-08-19 MED ORDER — FLUTICASONE-UMECLIDIN-VILANT 100-62.5-25 MCG/INH IN AEPB: 1.0000 | INHALATION_SPRAY | Freq: Every day | RESPIRATORY_TRACT | Status: DC

## 2019-08-19 MED ORDER — ESCITALOPRAM OXALATE 10 MG PO TABS
10.0000 mg | ORAL_TABLET | Freq: Every day | ORAL | Status: DC
  Administered 2019-08-19 – 2019-08-20 (×2): 10 mg via ORAL
  Filled 2019-08-19 (×2): qty 1

## 2019-08-19 MED ORDER — FLUTICASONE FUROATE-VILANTEROL 100-25 MCG/INH IN AEPB
1.0000 | INHALATION_SPRAY | Freq: Every day | RESPIRATORY_TRACT | Status: DC
  Administered 2019-08-20: 1 via RESPIRATORY_TRACT
  Filled 2019-08-19: qty 28

## 2019-08-19 MED ORDER — METFORMIN HCL 500 MG PO TABS: 1000.0000 mg | ORAL_TABLET | Freq: Two times a day (BID) | ORAL | Status: DC

## 2019-08-19 MED ORDER — ATORVASTATIN CALCIUM 10 MG PO TABS
20.0000 mg | ORAL_TABLET | Freq: Every day | ORAL | Status: DC
  Administered 2019-08-19 (×2): 20 mg via ORAL
  Filled 2019-08-19 (×2): qty 2

## 2019-08-19 MED ORDER — ASPIRIN EC 325 MG PO TBEC
325.0000 mg | DELAYED_RELEASE_TABLET | Freq: Every day | ORAL | Status: DC
  Administered 2019-08-19: 03:00:00 325 mg via ORAL
  Filled 2019-08-19 (×2): qty 1

## 2019-08-19 MED ORDER — ALBUTEROL SULFATE (2.5 MG/3ML) 0.083% IN NEBU: 3.0000 mL | INHALATION_SOLUTION | RESPIRATORY_TRACT | Status: DC | PRN

## 2019-08-19 MED ORDER — ASPIRIN EC 81 MG PO TBEC
81.0000 mg | DELAYED_RELEASE_TABLET | Freq: Every day | ORAL | Status: DC
  Administered 2019-08-20: 09:00:00 81 mg via ORAL
  Filled 2019-08-19: qty 1

## 2019-08-19 MED ORDER — PANTOPRAZOLE SODIUM 40 MG PO TBEC
40.0000 mg | DELAYED_RELEASE_TABLET | Freq: Every day | ORAL | Status: DC
  Administered 2019-08-19 – 2019-08-20 (×2): 40 mg via ORAL
  Filled 2019-08-19 (×2): qty 1

## 2019-08-19 MED ORDER — ALBUTEROL SULFATE HFA 108 (90 BASE) MCG/ACT IN AERS
2.0000 | INHALATION_SPRAY | RESPIRATORY_TRACT | Status: DC | PRN
  Filled 2019-08-19: qty 6.7

## 2019-08-19 MED ORDER — UMECLIDINIUM BROMIDE 62.5 MCG/INH IN AEPB
1.0000 | INHALATION_SPRAY | Freq: Every day | RESPIRATORY_TRACT | Status: DC
  Administered 2019-08-20: 1 via RESPIRATORY_TRACT
  Filled 2019-08-19: qty 7

## 2019-08-19 MED ORDER — IOHEXOL 350 MG/ML SOLN
100.0000 mL | Freq: Once | INTRAVENOUS | Status: AC | PRN
  Administered 2019-08-19: 02:00:00 100 mL via INTRAVENOUS

## 2019-08-19 MED ORDER — CLOPIDOGREL BISULFATE 75 MG PO TABS
75.0000 mg | ORAL_TABLET | Freq: Every day | ORAL | Status: DC
  Administered 2019-08-20: 75 mg via ORAL
  Filled 2019-08-19: qty 1

## 2019-08-19 MED ORDER — DIAZEPAM 5 MG PO TABS
5.0000 mg | ORAL_TABLET | Freq: Two times a day (BID) | ORAL | Status: DC | PRN
  Administered 2019-08-19: 23:00:00 5 mg via ORAL
  Filled 2019-08-19: qty 1

## 2019-08-19 MED ORDER — ENOXAPARIN SODIUM 40 MG/0.4ML ~~LOC~~ SOLN
40.0000 mg | SUBCUTANEOUS | Status: DC
  Administered 2019-08-19 – 2019-08-20 (×2): 40 mg via SUBCUTANEOUS
  Filled 2019-08-19 (×2): qty 0.4

## 2019-08-19 NOTE — Evaluation (Signed)
Physical Therapy Evaluation Patient Details Name: [name] [name] MRN: [id number] DOB: 1951/05/10 Today's Date: 08/19/2019   History of Present Illness  68y.o.F w/ history significant of anxiety, osteoarthritis, CAD, stage II chronic kidney disease, COPD/chronic hypoxic respiratory failure on 3 L [place] at home in sleep,depression, T2DM, diverticulosis, history of duodenitis, GERD, hemorrhoids, hyperlipidemia, hypertension, insomnia, tinnitus, vertigo admitted with left-sided weakness since 1/7 morning.  MRI showing watershed strokes.  Clinical Impression  Patient presents with decreased mobility due to deficits listed in PT problem list (mainly L side weakness and some inattention).  Currently minguard to min A for mobility and reports grandaughter can help at home and she can get help through [place] benefits.  PT to follow acutely.  Feel she can return home with HHPT at d/c.     Follow Up Recommendations Home health PT;Supervision/Assistance - 24 hour    Equipment Recommendations  None recommended by PT    Recommendations for Other Services       Precautions / Restrictions Precautions Precautions: Fall Precaution Comments: tripped over lip going into a store and feel injuring L shoulder around thanksgiving      Mobility  Bed Mobility Overal bed mobility: Needs Assistance Bed Mobility: Supine to Sit;Sit to Supine     Supine to sit: Min guard;HOB elevated Sit to supine: Supervision   General bed mobility comments: coming up from stretcher  Transfers Overall transfer level: Needs assistance Equipment used: Straight cane Transfers: Sit to/from Stand Sit to Stand: Min guard         General transfer comment: up from edge of bed with cane assist for balance  Ambulation/Gait Ambulation/Gait assistance: Min guard;Min assist Gait Distance (Feet): 120 Feet Assistive device: Rolling walker (2 wheeled);Straight cane Gait Pattern/deviations: Step-to pattern;Decreased stride  length;Decreased step length - left;Wide base of support     General Gait Details: initially with SPC leading with R side keeping L side retracted then with RW more symmetrical, but running into obstacles on L with RW, min A initially with cane, then minguard for safety with RW  Stairs            Wheelchair Mobility    Modified Rankin (Stroke Patients Only) Modified Rankin (Stroke Patients Only) Pre-Morbid Rankin Score: No significant disability Modified Rankin: Moderately severe disability     Balance Overall balance assessment: Needs assistance Sitting-balance support: No upper extremity supported Sitting balance-Leahy Scale: Fair     Standing balance support: Bilateral upper extremity supported;Single extremity supported Standing balance-Leahy Scale: Poor Standing balance comment: UE support for balance                             Pertinent Vitals/Pain Pain Assessment: 0-10 Pain Score: 9  Pain Location: L shoulder Pain Descriptors / Indicators: Aching;Sore Pain Intervention(s): Monitored during session;RN gave pain meds during session    [place] expects to be discharged to:: Private residence Living Arrangements: Other relatives(grandaughter 33 y/o staying with her) Available Help at Discharge: Family;Available PRN/intermittently Type of Home: House Home Access: Ramped entrance(also has w/c lift)     Home Layout: One level Home Equipment: Walker - 2 wheels;Wheelchair - manual;Shower seat      Prior Function Level of Independence: Independent         Comments: drives and does cooking and cleaning, still doing it since shoulder injury, just lost husband in July due to suicide     Hand Dominance  Extremity/Trunk Assessment   Upper Extremity Assessment Upper Extremity Assessment: LUE deficits/detail LUE Deficits / Details: pain with movement, can lift up into about 50 degrees abduction, elbow flexion at least 3/5,  grip WFL    Lower Extremity Assessment Lower Extremity Assessment: LLE deficits/detail LLE Deficits / Details: AROM WFL, strength hip flexion 3+/5, knee extension 4+/5, ankle DF 4/5 LLE Sensation: decreased light touch(on the balls of her feet)       Communication   Communication: No difficulties  Cognition Arousal/Alertness: Awake/alert Behavior During Therapy: WFL for tasks assessed/performed Overall Cognitive Status: Within Functional Limits for tasks assessed                                 General Comments: but some L side neglect      General Comments General comments (skin integrity, edema, etc.): BP in supine 80's/40's, seated 106/54, denies dizziness/lightheadedness, but pt was solemnent after pain meds given, but did not remember they had been given    Exercises     Assessment/Plan    PT Assessment Patient needs continued PT services  PT Problem List Decreased strength;Decreased mobility;Decreased safety awareness;Pain;Decreased knowledge of use of DME;Decreased balance;Decreased coordination       PT Treatment Interventions DME instruction;Therapeutic activities;Balance training;Patient/family education;Therapeutic exercise;Functional mobility training;Gait training    PT Goals (Current goals can be found in the Care Plan section)  Acute Rehab PT Goals Patient Stated Goal: to go home PT Goal Formulation: With patient Time For Goal Achievement: 08/26/19 Potential to Achieve Goals: Good    Frequency Min 4X/week   Barriers to discharge        Co-evaluation               AM-PAC PT "6 Clicks" Mobility  Outcome Measure Help needed turning from your back to your side while in a flat bed without using bedrails?: A Little Help needed moving from lying on your back to sitting on the side of a flat bed without using bedrails?: A Little Help needed moving to and from a bed to a chair (including a wheelchair)?: A Little Help needed standing up  from a chair using your arms (e.g., wheelchair or bedside chair)?: A Little Help needed to walk in hospital room?: A Little Help needed climbing 3-5 steps with a railing? : A Little 6 Click Score: 18    End of Session   Activity Tolerance: Patient tolerated treatment well Patient left: in bed;with call bell/phone within reach   PT Visit Diagnosis: Other abnormalities of gait and mobility (R26.89);History of falling (Z91.81)    Time: 1355-1419 PT Time Calculation (min) (ACUTE ONLY): 24 min   Charges:   PT Evaluation $PT Eval Moderate Complexity: 1 Mod PT Treatments $Gait Training: 8-22 mins        Magda Kiel, [place] Acute Rehabilitation Services [phone] 08/19/2019   Reginia Naas 08/19/2019, 3:45 PM

## 2019-08-19 NOTE — Progress Notes (Signed)
  Echocardiogram 2D Echocardiogram has been performed.  [name] [name] 08/19/2019, 9:48 AM

## 2019-08-19 NOTE — Progress Notes (Signed)
STROKE TEAM PROGRESS NOTE   INTERVAL HISTORY I have personally reviewed history of presenting illness with the patient in details, electronic medical records and imaging films in PACS.  She presented with sudden onset of left-sided weakness with dragging of leg 2 days ago and MRI scan shows multiple bilateral scattered embolic strokes right side more than left in the pseudowatershed distribution.  CT angiogram does not show significant extracranial or intracranial stenosis.  But does show aortic arch and proximal subclavian and right carotid bifurcation moderate stenosis  Vitals:   08/19/19 0612 08/19/19 0630 08/19/19 0700 08/19/19 0800  BP:  (!) 156/63 140/81 (!) 188/86  Pulse: 77 73 74 83  Resp: 14 12 14 16   Temp:      TempSrc:      SpO2: 100% 100% 100% 100%  Weight:      Height:        CBC:  Recent Labs  Lab 08/18/19 1542  WBC 8.1  NEUTROABS 5.0  HGB 11.9*  HCT 36.8  MCV 88.5  PLT 123XX123    Basic Metabolic Panel:  Recent Labs  Lab 08/18/19 1542  NA 135  K 3.9  CL 102  CO2 25  GLUCOSE 328*  BUN 16  CREATININE 0.92  CALCIUM 9.0   Lipid Panel:     Component Value Date/Time   CHOL 136 08/19/2019 0500   TRIG 70 08/19/2019 0500   HDL 53 08/19/2019 0500   CHOLHDL 2.6 08/19/2019 0500   VLDL 14 08/19/2019 0500   LDLCALC 69 08/19/2019 0500   LDLCALC 98 04/26/2019 0932   HgbA1c:  Lab Results  Component Value Date   HGBA1C 9.4 (H) 08/19/2019   Urine Drug Screen: No results found for: LABOPIA, COCAINSCRNUR, LABBENZ, AMPHETMU, THCU, LABBARB  Alcohol Level No results found for: ETH  IMAGING past 48 hours CT ANGIO HEAD W OR WO CONTRAST  Result Date: 08/19/2019 CLINICAL DATA:  Stroke follow-up EXAM: CT ANGIOGRAPHY HEAD AND NECK TECHNIQUE: Multidetector CT imaging of the head and neck was performed using the standard protocol during bolus administration of intravenous contrast. Multiplanar CT image reconstructions and MIPs were obtained to evaluate the vascular anatomy.  Carotid stenosis measurements (when applicable) are obtained utilizing NASCET criteria, using the distal internal carotid diameter as the denominator. CONTRAST:  175mL OMNIPAQUE IOHEXOL 350 MG/ML SOLN COMPARISON:  None. FINDINGS: CTA NECK FINDINGS SKELETON: There is no bony spinal canal stenosis. No lytic or blastic lesion. OTHER NECK: Normal pharynx, larynx and major salivary glands. No cervical lymphadenopathy. Unremarkable thyroid gland. UPPER CHEST: No pneumothorax or pleural effusion. No nodules or masses. AORTIC ARCH: There is moderate calcific atherosclerosis of the aortic arch. There is no aneurysm, dissection or hemodynamically significant stenosis of the visualized portion of the aorta. The main pulmonary artery is enlarged, measuring 3.7 cm. The visualized proximal subclavian arteries are widely patent. RIGHT CAROTID SYSTEM: No dissection, occlusion or aneurysm. There is mixed density atherosclerosis extending into the proximal ICA, resulting in 50% stenosis. LEFT CAROTID SYSTEM: Bulky calcification at the origin of the common carotid artery. No stenosis of the bifurcation. VERTEBRAL ARTERIES: Left dominant configuration. Limited visualization of the origins. There is no dissection, occlusion or flow-limiting stenosis to the skull base (V1-V3 segments). CTA HEAD FINDINGS Motion during image acquisition degrades the intracranial portion of the study, limiting assessment for stenoses. Within that limitation, there is no proximal large vessel occlusion. VENOUS SINUSES: As permitted by contrast timing, patent. ANATOMIC VARIANTS: None Review of the MIP images confirms the above findings.  IMPRESSION: 1. Motion degraded examination, limiting assessment of the intracranial arteries. Within that limitation, there is no proximal large vessel occlusion. 2. 50% stenosis of the proximal right internal carotid artery due to bulky atherosclerotic calcification. 3. Enlarged main pulmonary artery, suggesting pulmonary  arterial hypertension. 4. Moderate atherosclerosis of the aortic arch and proximal arch vessels. Aortic Atherosclerosis (ICD10-I70.0). Electronically Signed   By: Ulyses Jarred M.D.   On: 08/19/2019 02:49   DG Chest 2 View  Result Date: 08/18/2019 CLINICAL DATA:  Weakness EXAM: CHEST - 2 VIEW COMPARISON:  07/22/2019 FINDINGS: Cardiomegaly. Both lungs are clear. Disc degenerative disease of the thoracic spine. IMPRESSION: Cardiomegaly without acute abnormality of the lungs. Electronically Signed   By: Eddie Candle M.D.   On: 08/18/2019 16:27   CT Head Wo Contrast  Result Date: 08/18/2019 CLINICAL DATA:  Left-sided weakness for several hours EXAM: CT HEAD WITHOUT CONTRAST TECHNIQUE: Contiguous axial images were obtained from the base of the skull through the vertex without intravenous contrast. COMPARISON:  06/12/2005 FINDINGS: Brain: Mild atrophic changes are noted. No findings to suggest acute hemorrhage, acute infarction or space-occupying mass lesion are noted. Vascular: No hyperdense vessel or unexpected calcification. Skull: Normal. Negative for fracture or focal lesion. Sinuses/Orbits: No acute finding. Other: None. IMPRESSION: Mild atrophic changes without acute abnormality. Electronically Signed   By: Inez Catalina M.D.   On: 08/18/2019 18:50   CT ANGIO NECK W OR WO CONTRAST  Result Date: 08/19/2019 CLINICAL DATA:  Stroke follow-up EXAM: CT ANGIOGRAPHY HEAD AND NECK TECHNIQUE: Multidetector CT imaging of the head and neck was performed using the standard protocol during bolus administration of intravenous contrast. Multiplanar CT image reconstructions and MIPs were obtained to evaluate the vascular anatomy. Carotid stenosis measurements (when applicable) are obtained utilizing NASCET criteria, using the distal internal carotid diameter as the denominator. CONTRAST:  124mL OMNIPAQUE IOHEXOL 350 MG/ML SOLN COMPARISON:  None. FINDINGS: CTA NECK FINDINGS SKELETON: There is no bony spinal canal stenosis.  No lytic or blastic lesion. OTHER NECK: Normal pharynx, larynx and major salivary glands. No cervical lymphadenopathy. Unremarkable thyroid gland. UPPER CHEST: No pneumothorax or pleural effusion. No nodules or masses. AORTIC ARCH: There is moderate calcific atherosclerosis of the aortic arch. There is no aneurysm, dissection or hemodynamically significant stenosis of the visualized portion of the aorta. The main pulmonary artery is enlarged, measuring 3.7 cm. The visualized proximal subclavian arteries are widely patent. RIGHT CAROTID SYSTEM: No dissection, occlusion or aneurysm. There is mixed density atherosclerosis extending into the proximal ICA, resulting in 50% stenosis. LEFT CAROTID SYSTEM: Bulky calcification at the origin of the common carotid artery. No stenosis of the bifurcation. VERTEBRAL ARTERIES: Left dominant configuration. Limited visualization of the origins. There is no dissection, occlusion or flow-limiting stenosis to the skull base (V1-V3 segments). CTA HEAD FINDINGS Motion during image acquisition degrades the intracranial portion of the study, limiting assessment for stenoses. Within that limitation, there is no proximal large vessel occlusion. VENOUS SINUSES: As permitted by contrast timing, patent. ANATOMIC VARIANTS: None Review of the MIP images confirms the above findings. IMPRESSION: 1. Motion degraded examination, limiting assessment of the intracranial arteries. Within that limitation, there is no proximal large vessel occlusion. 2. 50% stenosis of the proximal right internal carotid artery due to bulky atherosclerotic calcification. 3. Enlarged main pulmonary artery, suggesting pulmonary arterial hypertension. 4. Moderate atherosclerosis of the aortic arch and proximal arch vessels. Aortic Atherosclerosis (ICD10-I70.0). Electronically Signed   By: Ulyses Jarred M.D.   On: 08/19/2019 02:49  MR Brain Wo Contrast (neuro protocol)  Result Date: 08/18/2019 CLINICAL DATA:  Ataxia EXAM:  MRI HEAD WITHOUT CONTRAST TECHNIQUE: Multiplanar, multiecho pulse sequences of the brain and surrounding structures were obtained without intravenous contrast. COMPARISON:  Head CT 08/18/2019 and brain MRI 06/12/2005 FINDINGS: Brain: There is multifocal acute ischemia within both hemispheres, predominantly in a watershed distribution. There is no hemorrhage or mass effect. Multifocal white matter hyperintensity, most commonly due to chronic ischemic microangiopathy. The cerebral and cerebellar volume are age-appropriate. There is no hydrocephalus. Blood-sensitive sequences show no chronic microhemorrhage or superficial siderosis. The midline structures are normal. Vascular: Normal flow voids. Skull and upper cervical spine: Normal marrow signal. Sinuses/Orbits: Negative. Other: None. IMPRESSION: 1. Multifocal acute ischemia within both hemispheres, right worse than left, predominantly in a watershed distribution. No hemorrhage or mass effect. 2. Chronic ischemic microangiopathy. Electronically Signed   By: Ulyses Jarred M.D.   On: 08/18/2019 22:43    PHYSICAL EXAM Pleasant elderly Caucasian lady not in distress. . Afebrile. Head is nontraumatic. Neck is supple without bruit.    Cardiac exam no murmur or gallop. Lungs are clear to auscultation. Distal pulses are well felt. Neurological Exam :  She is awake alert oriented to time place and person.  Speech and language appear normal without dysarthria aphasia or apraxia.  Extraocular movements are full range without nystagmus.  She blinks to threat bilaterally.  Mild left lower facial weakness.  Tongue midline motor system exam reveals mild left upper and lower extremity drift mild weakness of left grip and intrinsic hand muscles.  Mild left hip flexor and ankle dorsiflexor weakness 4/5.  Sensation is intact.  Deep tendon reflexes symmetric.  Plantars downgoing.  Gait not tested. ASSESSMENT/PLAN Ms. VANNA PETZOLDT is a 69 y.o. female with history of HTN, DB  presenting with L sided weakness.   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  MRI  R>L multifocal ischemia anterior circulation. Small vessel disease.   CTA head & neck proximal R ICA 50% stenosis. enlarged main pulm artery ? pulm HTN. Moderate atherosclerosis aortic arch, proximal arch. Aortic atherosclerosis.   Carotid Doppler  B ICA 1-39% stenosis, VAs antegrade   2D Echo EF 60-65%. No source of embolus   Consider for loop prior to d/c if source of stroke not found  LDL 69  HgbA1c 9.4  Lovenox 40 mg sq daily for VTE prophylaxis  aspirin 81 mg daily prior to admission, now on aspirin 325 mg daily. Given mild stroke, recommend aspirin 81 mg and plavix 75 mg daily x 3 weeks, then PLAVIX alone. Orders adjusted. Therapy recommendations:  pending   Disposition:  pending   Possible Carotid Stenosis, ruled out  CTA  neck proximal R ICA 50% stenosis.  CD R ICA 1-39% stenosis  Hypertension  Stable . Permissive hypertension (OK if < 220/120) but gradually normalize in 5-7 days . Long-term BP goal normotensive  Hyperlipidemia  Home meds:  lipitor 20, resumed in hospital  LDL 69, goal < 70  Will not put on intensive statin given low LDL and advanced age  Continue statin at discharge  Diabetes type II Uncontrolled  HgbA1c 9.4, goal < 7.0  CBGs Recent Labs    08/18/19 2319 08/19/19 0121 08/19/19 0747  GLUCAP 134* 146* 193*      SSI  Other Stroke Risk Factors  Advanced age  Cigarette smoker, quit ~ 1 yrs ago, advised to continue  cessation  Obesity, Body mass index is 33.3 kg/m., recommend weight loss, diet and exercise as appropriate   Coronary artery disease s/p PCI  Other Active Problems  Depression on SSRI  GERD on PPI  COPD on 3L home O2  Chronic pain on hydrocodone  Hospital day # 1  I have personally obtained history,examined this patient, reviewed  notes, independently viewed imaging studies, participated in medical decision making and plan of care.ROS completed by me personally and pertinent positives fully documented  I have made any additions or clarifications directly to the above note.  She presented with 2-day history of left-sided weakness and MRI shows by cerebral right greater than left multiple small embolic strokes.  CT angio shows moderate atheromatous changes including right carotid however proximal cardiac source of embolism and atrial fibrillation is also a strong possibility.  Recommend aspirin Plavix for 3 weeks followed by Plavix alone.  She will need outpatient 30-day heart monitor for paroxysmal A. fib.  Continue ongoing stroke work-up and aggressive risk factor modification.  Mobilize out of bed.  Therapy consults.  Long discussion with Dr. Maren Beach and answered questions.  Greater than 50% time during this 35-minute visit was spent on counseling and coordination of care and answering questions.  Follow-up as an outpatient stroke clinic in 6 weeks.  Stroke team will sign off kindly call for questions. Antony Contras, MD Medical Director [facility] Pager: [phone] 08/19/2019 6:36 PM   To contact Stroke Continuity provider, please refer to [email or web address] After hours, contact General Neurology

## 2019-08-19 NOTE — Progress Notes (Signed)
Carotid duplex completed.  Refer to "CV Proc" under chart review to view preliminary results.  08/19/2019 11:34 AM Kelby Aline for Siebrecht, K., RVT, RDMS

## 2019-08-19 NOTE — Progress Notes (Signed)
PROGRESS NOTE    [name] [name]  [id number] DOB: August 11, 1951 DOA: 08/18/2019 PCP: Susy Frizzle, MD   Brief Narrative:  As per Admitting HPI:  69y.o.F w/ history significant of anxiety, osteoarthritis, CAD, stage II chronic kidney disease, COPD/chronic hypoxic respiratory failure on 3 L [place] at home in sleep,depression, T2DM, diverticulosis, history of duodenitis, GERD, hemorrhoids, hyperlipidemia, hypertension, insomnia, tinnitus, vertigo admitted with left-sided weakness since 1/7 morning, with left leg dragging not getting better, who discussed with her PCP who also sent her to the ER. In the ER, blood work with hyperglycemia 328.  Anemia 11.9 g, albumin 3.1, MRI showed "multifocal acute ischemia within both hemispheres, right worse than left, predominantly in the watershed distribution.  There was no hemorrhage or mass-effect.  There is chronic ischemic angiopathy" Neurology was consulted and admitted.  Subjective: Seen this morning leg weakness improving. Denies nausea vomiting fever chills.  Assessment & Plan:  Acute cerebrovascular accident, MRI showing watershed strokes, likely with generalized hypoperfusion event.  Appreciate neurology input CT angio head and neck shows a less than 50% stenosis of the proximal right internal carotid artery due to bulky atherosclerotic calcification, no proximal large vessel occlusion. pt reports taking regular aspirin at home. Per neuro cont aspirin 325, Lipitor.  A1c poorly controlled at 9.7, LDL at goal at 69.  Complete stroke work-up with echocardiogram neuro check neuro follow-up PT OT and speech eval.  CAD, EKG- normal sinus rhythm no acute ischemic changes.  On aspirin.  Depression: on Home SSRI  GERD: On PPI  Hypercholesteremia: ldl at goal  Hypertension: Blood pressure fairly controlled allow permissive hypertension  Type 2 diabetes mellitus with uncontrolled hyperglycemia, poorly controlled A1c 9.7 07/22/2019.  Hold home  Metformin continue sliding scale insulin Recent Labs  Lab 08/18/19 1549 08/18/19 2319 08/19/19 0121  GLUCAP 300* 134* 146*   COPD/chronic hypoxic respiratory failure on 3 L [place] at home in sleep.  Chronic pain patient reports taking hydrocodone at home and requesting.  Body mass index is 33.3 kg/m.    DVT prophylaxis: lovenox Code Status: full Family Communication: plan of care discussed with patient at bedside. Disposition Plan: Remains inpatient pending completion of stroke work-up further evaluation by PT OT neurology and further disposition.  Consultants: neuro Procedures:  CTA head/Neck  1Motion degraded examination, limiting assessment of the intracranial arteries. Within that limitation, there is no proximal large vessel occlusion. 2. 50% stenosis of the proximal right internal carotid artery due to bulky atherosclerotic calcification. 3. Enlarged main pulmonary artery, suggesting pulmonary arterial hypertension. 4. Moderate atherosclerosis of the aortic arch and proximal arch Vessels.  Microbiology:  Antimicrobials: Anti-infectives (From admission, onward)   None       Objective: Vitals:   08/19/19 0600 08/19/19 0608 08/19/19 0612 08/19/19 0630  BP: 136/63   (!) 156/63  Pulse: 78 79 77 73  Resp: 15 14 14 12   Temp:      TempSrc:      SpO2: 90% 100% 100% 100%  Weight:      Height:       No intake or output data in the 24 hours ending 08/19/19 0724 Filed Weights   08/18/19 1538  Weight: 88 kg   Weight change:   Body mass index is 33.3 kg/m.  Intake/Output from previous day: No intake/output data recorded. Intake/Output this shift: No intake/output data recorded.  Examination:  General exam: AAOx3,NAD, Weak appearing. HEENT:Oral mucosa moist, Ear/Nose WNL grossly, dentition normal. Respiratory system: Diminished at the base,no wheezing or  crackles,no use of accessory muscle Cardiovascular system: S1 & S2 +, No JVD,. Gastrointestinal  system: Abdomen soft, NT,ND, BS+ Nervous System:Alert, awake, moving extremities and with good strength Extremities: No edema, distal peripheral pulses palpable.  Skin: No rashes,no icterus. MSK: Normal muscle bulk,tone, power  Medications:  Scheduled Meds: . aspirin EC  325 mg Oral Daily  . atorvastatin  20 mg Oral q1800  . escitalopram  10 mg Oral Daily  . Fluticasone-Umeclidin-Vilant  1 Inhaler Inhalation Daily  . insulin aspart  0-15 Units Subcutaneous TID WC  . insulin aspart  0-5 Units Subcutaneous QHS  . latanoprost  1 drop Both Eyes QHS  . metFORMIN  1,000 mg Oral BID WC  . pantoprazole  40 mg Oral Daily  . rOPINIRole  2 mg Oral QHS  . [START ON 08/23/2019] Vitamin D (Ergocalciferol)  50,000 Units Oral Q Tue   Continuous Infusions:  Data Reviewed: I have personally reviewed following labs and imaging studies  CBC: Recent Labs  Lab 08/18/19 1542  WBC 8.1  NEUTROABS 5.0  HGB 11.9*  HCT 36.8  MCV 88.5  PLT 123XX123   Basic Metabolic Panel: Recent Labs  Lab 08/18/19 1542  NA 135  K 3.9  CL 102  CO2 25  GLUCOSE 328*  BUN 16  CREATININE 0.92  CALCIUM 9.0   GFR: Estimated Creatinine Clearance: 61.1 mL/min (by C-G formula based on SCr of 0.92 mg/dL). Liver Function Tests: Recent Labs  Lab 08/18/19 1542  AST 24  ALT 32  ALKPHOS 52  BILITOT 0.3  PROT 6.8  ALBUMIN 3.1*   No results for input(s): LIPASE, AMYLASE in the last 168 hours. No results for input(s): AMMONIA in the last 168 hours. Coagulation Profile: No results for input(s): INR, PROTIME in the last 168 hours. Cardiac Enzymes: Recent Labs  Lab 08/18/19 1945  CKTOTAL 107   BNP (last 3 results) No results for input(s): PROBNP in the last 8760 hours. HbA1C: No results for input(s): HGBA1C in the last 72 hours. CBG: Recent Labs  Lab 08/18/19 1549 08/18/19 2319 08/19/19 0121  GLUCAP 300* 134* 146*   Lipid Profile: Recent Labs    08/19/19 0500  CHOL 136  HDL 53  LDLCALC 69  TRIG 70    CHOLHDL 2.6   Thyroid Function Tests: No results for input(s): TSH, T4TOTAL, FREET4, T3FREE, THYROIDAB in the last 72 hours. Anemia Panel: No results for input(s): VITAMINB12, FOLATE, FERRITIN, TIBC, IRON, RETICCTPCT in the last 72 hours. Sepsis Labs: No results for input(s): PROCALCITON, LATICACIDVEN in the last 168 hours.  Recent Results (from the past 240 hour(s))  Respiratory Panel by RT PCR (Flu A&B, Covid) - Nasopharyngeal Swab     Status: None   Collection Time: 08/18/19  4:59 PM   Specimen: Nasopharyngeal Swab  Result Value Ref Range Status   SARS Coronavirus 2 by RT PCR NEGATIVE NEGATIVE Final    Comment: (NOTE) SARS-CoV-2 target nucleic acids are NOT DETECTED. The SARS-CoV-2 RNA is generally detectable in upper respiratoy specimens during the acute phase of infection. The lowest concentration of SARS-CoV-2 viral copies this assay can detect is 131 copies/mL. A negative result does not preclude SARS-Cov-2 infection and should not be used as the sole basis for treatment or other patient management decisions. A negative result may occur with  improper specimen collection/handling, submission of specimen other than nasopharyngeal swab, presence of viral mutation(s) within the areas targeted by this assay, and inadequate number of viral copies (<131 copies/mL). A negative result must  be combined with clinical observations, patient history, and epidemiological information. The expected result is Negative. Fact Sheet for Patients:  PinkCheek.be Fact Sheet for Healthcare Providers:  GravelBags.it This test is not yet ap proved or cleared by the [place] FDA and  has been authorized for detection and/or diagnosis of SARS-CoV-2 by FDA under an Emergency Use Authorization (EUA). This EUA will remain  in effect (meaning this test can be used) for the duration of the COVID-19 declaration under Section 564(b)(1) of the  Act, 21 U.S.C. section 360bbb-3(b)(1), unless the authorization is terminated or revoked sooner.    Influenza A by PCR NEGATIVE NEGATIVE Final   Influenza B by PCR NEGATIVE NEGATIVE Final    Comment: (NOTE) The Xpert Xpress SARS-CoV-2/FLU/RSV assay is intended as an aid in  the diagnosis of influenza from Nasopharyngeal swab specimens and  should not be used as a sole basis for treatment. Nasal washings and  aspirates are unacceptable for Xpert Xpress SARS-CoV-2/FLU/RSV  testing. Fact Sheet for Patients: PinkCheek.be Fact Sheet for Healthcare Providers: GravelBags.it This test is not yet approved or cleared by the [place] FDA and  has been authorized for detection and/or diagnosis of SARS-CoV-2 by  FDA under an Emergency Use Authorization (EUA). This EUA will remain  in effect (meaning this test can be used) for the duration of the  Covid-19 declaration under Section 564(b)(1) of the Act, 21  U.S.C. section 360bbb-3(b)(1), unless the authorization is  terminated or revoked. Performed at [facility], [place] [address], [place], [place] [address]       Radiology Studies: CT ANGIO HEAD W OR WO CONTRAST  Result Date: 08/19/2019 CLINICAL DATA:  Stroke follow-up EXAM: CT ANGIOGRAPHY HEAD AND NECK TECHNIQUE: Multidetector CT imaging of the head and neck was performed using the standard protocol during bolus administration of intravenous contrast. Multiplanar CT image reconstructions and MIPs were obtained to evaluate the vascular anatomy. Carotid stenosis measurements (when applicable) are obtained utilizing NASCET criteria, using the distal internal carotid diameter as the denominator. CONTRAST:  143mL OMNIPAQUE IOHEXOL 350 MG/ML SOLN COMPARISON:  None. FINDINGS: CTA NECK FINDINGS SKELETON: There is no bony spinal canal stenosis. No lytic or blastic lesion. OTHER NECK: Normal pharynx, larynx and major salivary glands. No  cervical lymphadenopathy. Unremarkable thyroid gland. UPPER CHEST: No pneumothorax or pleural effusion. No nodules or masses. AORTIC ARCH: There is moderate calcific atherosclerosis of the aortic arch. There is no aneurysm, dissection or hemodynamically significant stenosis of the visualized portion of the aorta. The main pulmonary artery is enlarged, measuring 3.7 cm. The visualized proximal subclavian arteries are widely patent. RIGHT CAROTID SYSTEM: No dissection, occlusion or aneurysm. There is mixed density atherosclerosis extending into the proximal ICA, resulting in 50% stenosis. LEFT CAROTID SYSTEM: Bulky calcification at the origin of the common carotid artery. No stenosis of the bifurcation. VERTEBRAL ARTERIES: Left dominant configuration. Limited visualization of the origins. There is no dissection, occlusion or flow-limiting stenosis to the skull base (V1-V3 segments). CTA HEAD FINDINGS Motion during image acquisition degrades the intracranial portion of the study, limiting assessment for stenoses. Within that limitation, there is no proximal large vessel occlusion. VENOUS SINUSES: As permitted by contrast timing, patent. ANATOMIC VARIANTS: None Review of the MIP images confirms the above findings. IMPRESSION: 1. Motion degraded examination, limiting assessment of the intracranial arteries. Within that limitation, there is no proximal large vessel occlusion. 2. 50% stenosis of the proximal right internal carotid artery due to bulky atherosclerotic calcification. 3. Enlarged main pulmonary  artery, suggesting pulmonary arterial hypertension. 4. Moderate atherosclerosis of the aortic arch and proximal arch vessels. Aortic Atherosclerosis (ICD10-I70.0). Electronically Signed   By: Ulyses Jarred M.D.   On: 08/19/2019 02:49   DG Chest 2 View  Result Date: 08/18/2019 CLINICAL DATA:  Weakness EXAM: CHEST - 2 VIEW COMPARISON:  07/22/2019 FINDINGS: Cardiomegaly. Both lungs are clear. Disc degenerative disease  of the thoracic spine. IMPRESSION: Cardiomegaly without acute abnormality of the lungs. Electronically Signed   By: Eddie Candle M.D.   On: 08/18/2019 16:27   CT Head Wo Contrast  Result Date: 08/18/2019 CLINICAL DATA:  Left-sided weakness for several hours EXAM: CT HEAD WITHOUT CONTRAST TECHNIQUE: Contiguous axial images were obtained from the base of the skull through the vertex without intravenous contrast. COMPARISON:  06/12/2005 FINDINGS: Brain: Mild atrophic changes are noted. No findings to suggest acute hemorrhage, acute infarction or space-occupying mass lesion are noted. Vascular: No hyperdense vessel or unexpected calcification. Skull: Normal. Negative for fracture or focal lesion. Sinuses/Orbits: No acute finding. Other: None. IMPRESSION: Mild atrophic changes without acute abnormality. Electronically Signed   By: Inez Catalina M.D.   On: 08/18/2019 18:50   CT ANGIO NECK W OR WO CONTRAST  Result Date: 08/19/2019 CLINICAL DATA:  Stroke follow-up EXAM: CT ANGIOGRAPHY HEAD AND NECK TECHNIQUE: Multidetector CT imaging of the head and neck was performed using the standard protocol during bolus administration of intravenous contrast. Multiplanar CT image reconstructions and MIPs were obtained to evaluate the vascular anatomy. Carotid stenosis measurements (when applicable) are obtained utilizing NASCET criteria, using the distal internal carotid diameter as the denominator. CONTRAST:  143mL OMNIPAQUE IOHEXOL 350 MG/ML SOLN COMPARISON:  None. FINDINGS: CTA NECK FINDINGS SKELETON: There is no bony spinal canal stenosis. No lytic or blastic lesion. OTHER NECK: Normal pharynx, larynx and major salivary glands. No cervical lymphadenopathy. Unremarkable thyroid gland. UPPER CHEST: No pneumothorax or pleural effusion. No nodules or masses. AORTIC ARCH: There is moderate calcific atherosclerosis of the aortic arch. There is no aneurysm, dissection or hemodynamically significant stenosis of the visualized portion  of the aorta. The main pulmonary artery is enlarged, measuring 3.7 cm. The visualized proximal subclavian arteries are widely patent. RIGHT CAROTID SYSTEM: No dissection, occlusion or aneurysm. There is mixed density atherosclerosis extending into the proximal ICA, resulting in 50% stenosis. LEFT CAROTID SYSTEM: Bulky calcification at the origin of the common carotid artery. No stenosis of the bifurcation. VERTEBRAL ARTERIES: Left dominant configuration. Limited visualization of the origins. There is no dissection, occlusion or flow-limiting stenosis to the skull base (V1-V3 segments). CTA HEAD FINDINGS Motion during image acquisition degrades the intracranial portion of the study, limiting assessment for stenoses. Within that limitation, there is no proximal large vessel occlusion. VENOUS SINUSES: As permitted by contrast timing, patent. ANATOMIC VARIANTS: None Review of the MIP images confirms the above findings. IMPRESSION: 1. Motion degraded examination, limiting assessment of the intracranial arteries. Within that limitation, there is no proximal large vessel occlusion. 2. 50% stenosis of the proximal right internal carotid artery due to bulky atherosclerotic calcification. 3. Enlarged main pulmonary artery, suggesting pulmonary arterial hypertension. 4. Moderate atherosclerosis of the aortic arch and proximal arch vessels. Aortic Atherosclerosis (ICD10-I70.0). Electronically Signed   By: Ulyses Jarred M.D.   On: 08/19/2019 02:49   MR Brain Wo Contrast (neuro protocol)  Result Date: 08/18/2019 CLINICAL DATA:  Ataxia EXAM: MRI HEAD WITHOUT CONTRAST TECHNIQUE: Multiplanar, multiecho pulse sequences of the brain and surrounding structures were obtained without intravenous contrast. COMPARISON:  Head CT  08/18/2019 and brain MRI 06/12/2005 FINDINGS: Brain: There is multifocal acute ischemia within both hemispheres, predominantly in a watershed distribution. There is no hemorrhage or mass effect. Multifocal white  matter hyperintensity, most commonly due to chronic ischemic microangiopathy. The cerebral and cerebellar volume are age-appropriate. There is no hydrocephalus. Blood-sensitive sequences show no chronic microhemorrhage or superficial siderosis. The midline structures are normal. Vascular: Normal flow voids. Skull and upper cervical spine: Normal marrow signal. Sinuses/Orbits: Negative. Other: None. IMPRESSION: 1. Multifocal acute ischemia within both hemispheres, right worse than left, predominantly in a watershed distribution. No hemorrhage or mass effect. 2. Chronic ischemic microangiopathy. Electronically Signed   By: Ulyses Jarred M.D.   On: 08/18/2019 22:43      LOS: 1 day   Time spent: More than 50% of that time was spent in counseling and/or coordination of care.  Antonieta Pert, MD [facility]  08/19/2019, 7:24 AM

## 2019-08-19 NOTE — ED Notes (Signed)
Pt pad changed and purewick replaced

## 2019-08-19 NOTE — ED Notes (Signed)
Spoke with pt regarding low O2 sats. Pt stated that she is normally on 3l [place] at home while she sleeps. 3L [place] applied to patient

## 2019-08-19 NOTE — Consult Note (Signed)
Neurology Consultation Reason for Consult: Stroke Referring Physician: Cathi Roan, C  CC: Stroke  History is obtained from: Patient  HPI: [name] [name] is a 69 y.o. female with a history of diabetes, hypertension who presents with dragging her feet since yesterday morning.  She is fairly clear that it occurred abruptly while awake.  She denies any dizziness or lightheadedness at the time of onset.  She does have a history of dizzy spells which were fixed by adjusting her hypertensive medications, but has not had any recently.  Since that time, she has noticed that her left leg has been dragging.  Since it was not getting better, she sought care in the emergency department today.   LKW: 10:30 AM, 1/6 tpa given?: no, outside of window    ROS: A 14 point ROS was performed and is negative except as noted in the HPI.   Past Medical History:  Diagnosis Date  . Anxiety   . Arthritis   . CAD (coronary artery disease)   . Chronic kidney disease    Renal insufficiency  . COPD (chronic obstructive pulmonary disease) ([place])   . Depression   . Diabetes mellitus   . Diverticulosis of colon (without mention of hemorrhage)   . Duodenitis without mention of hemorrhage   . GERD (gastroesophageal reflux disease)   . Hemorrhoids   . Hypercholesteremia   . Hypertension   . Insomnia   . Tinnitus   . Vertigo      Family History  Problem Relation Age of Onset  . Diabetes Mother   . Hemophilia Mother   . Breast cancer Sister   . Heart disease Father   . Colon polyps Father   . Diabetes Brother   . Colon polyps Sister      Social History:  reports that she quit smoking about a year ago. Her smoking use included cigarettes. She has never used smokeless tobacco. She reports that she does not drink alcohol or use drugs.   Exam: Current vital signs: BP (!) 72/50 (BP Location: Right Arm)   Pulse 73   Temp 98.3 F (36.8 C) (Oral)   Resp 13   Ht 5\' 4"  (1.626 m)   Wt 88 kg   SpO2 96%    BMI 33.30 kg/m  Vital signs in last 24 hours: Temp:  [98.3 F (36.8 C)-98.4 F (36.9 C)] 98.3 F (36.8 C) (01/08 0017) Pulse Rate:  [25-107] 73 (01/08 0107) Resp:  [11-22] 13 (01/08 0107) BP: (72-186)/(50-156) 72/50 (01/08 0107) SpO2:  [93 %-97 %] 96 % (01/08 0107) Weight:  [88 kg] 88 kg (01/07 1538)   Physical Exam  Constitutional: Appears well-developed and well-nourished.  Psych: Affect appropriate to situation Eyes: No scleral injection HENT: No OP obstrucion MSK: no joint deformities.  Cardiovascular: Normal rate and regular rhythm.  Respiratory: Effort normal, non-labored breathing GI: Soft.  No distension. There is no tenderness.  Skin: WDI  Neuro: Mental Status: Patient is awake, alert, oriented to person, place, month, year, and situation. Patient is able to give a clear and coherent history. No signs of aphasia or neglect Cranial Nerves: II: Visual Fields are full. Pupils are equal, round, and reactive to light.   III,IV, VI: EOMI without ptosis or diploplia.  V: Facial sensation is symmetric to temperature VII: Facial movement is symmetric.  VIII: hearing is intact to voice X: Uvula elevates symmetrically XI: Shoulder shrug is symmetric. XII: tongue is midline without atrophy or fasciculations.  Motor: Tone is normal. Bulk is  normal. 5/5 strength was present on the right, on the left she has 4/5 weakness of the left arm and leg Sensory: Sensation is symmetric to light touch and temperature in the arms and legs. Cerebellar: FNF and HKS are intact on the right, appear ataxic on the left.   I have reviewed labs in epic and the results pertinent to this consultation are: CMP-glucose 328, albumin 3.1 otherwise unremarkable   I have reviewed the images obtained: MRI brain-watershed appearing infarcts which the bilateral are more prominent on the right  Impression: 69 year old female with what appeared to be watershed strokes.  This appearance is most  consistent with a generalized hypoperfusion event, though the lack of syncope or even dizziness would be extremely unusual.  Given the asymmetrical nature, vascular imaging is also definitely needed.  Recommendations: - HgbA1c, fasting lipid panel - Frequent neuro checks - Echocardiogram -CT angio head and neck - Prophylactic therapy-Antiplatelet med: Aspirin - dose 325mg  PO or 300mg  PR - Risk factor modification - Telemetry monitoring - PT consult, OT consult, Speech consult - Stroke team to follow    Roland Rack, MD [facility] [phone]  If 7pm- 7am, please page neurology on call as listed in [place].

## 2019-08-19 NOTE — ED Notes (Signed)
Tele   Breakfast ordered  

## 2019-08-20 ENCOUNTER — Other Ambulatory Visit: Payer: Self-pay | Admitting: Physician Assistant

## 2019-08-20 DIAGNOSIS — I639 Cerebral infarction, unspecified: Secondary | ICD-10-CM

## 2019-08-20 LAB — GLUCOSE, CAPILLARY: Glucose-Capillary: 181 mg/dL — ABNORMAL HIGH (ref 70–99)

## 2019-08-20 MED ORDER — DILTIAZEM HCL ER COATED BEADS 180 MG PO CP24
180.0000 mg | ORAL_CAPSULE | Freq: Every day | ORAL | Status: DC
  Administered 2019-08-20: 09:00:00 180 mg via ORAL
  Filled 2019-08-20: qty 1

## 2019-08-20 MED ORDER — LOSARTAN POTASSIUM 50 MG PO TABS
100.0000 mg | ORAL_TABLET | Freq: Every day | ORAL | Status: DC
  Administered 2019-08-20: 100 mg via ORAL
  Filled 2019-08-20: qty 2

## 2019-08-20 MED ORDER — CLOPIDOGREL BISULFATE 75 MG PO TABS: 75.0000 mg | ORAL_TABLET | Freq: Every day | ORAL | 1 refills | Status: DC

## 2019-08-20 MED ORDER — ASPIRIN 81 MG PO TBEC: 81.0000 mg | DELAYED_RELEASE_TABLET | Freq: Every day | ORAL | 0 refills | Status: AC

## 2019-08-20 MED ORDER — FUROSEMIDE 40 MG PO TABS
40.0000 mg | ORAL_TABLET | Freq: Every day | ORAL | Status: DC
  Administered 2019-08-20: 09:00:00 40 mg via ORAL
  Filled 2019-08-20: qty 1

## 2019-08-20 NOTE — Evaluation (Signed)
Occupational Therapy Evaluation Patient Details Name: [name] [name] MRN: [id number] DOB: 05-20-1951 Today's Date: 08/20/2019    History of Present Illness 68y.o.F w/ history significant of anxiety, osteoarthritis, CAD, stage II chronic kidney disease, COPD/chronic hypoxic respiratory failure on 3 L [place] at home in sleep,depression, T2DM, diverticulosis, history of duodenitis, GERD, hemorrhoids, hyperlipidemia, hypertension, insomnia, tinnitus, vertigo admitted with left-sided weakness since 1/7 morning.  MRI showing watershed strokes.   Clinical Impression   PTA patient independent with ADLs, mobility; noted recent fall with L shoulder injury but functioning with modified independence at home.  Admitted for above and limited by problem list below, including L sided weakness and shoulder pain, impaired balance, decreased activity tolerance.  She currently requires min assist for UB ADLs, min guard for LB ADls, and min guard for transfers/in room mobility using RW.  She has 24/7 support at home from sister, sister in law and granddaughter. She will benefit from continued OT services while admitted and after dc at [facility] level in order to optimize independence and safety with ADLs, mobility.    Follow Up Recommendations  Home health OT;Supervision/Assistance - 24 hour    Equipment Recommendations  None recommended by OT    Recommendations for Other Services       Precautions / Restrictions Precautions Precautions: Fall Precaution Comments: tripped over lip going into a store and fell injuring L shoulder around thanksgiving Restrictions Weight Bearing Restrictions: No      Mobility Bed Mobility Overal bed mobility: Needs Assistance Bed Mobility: Supine to Sit     Supine to sit: Supervision     General bed mobility comments: for safety   Transfers Overall transfer level: Needs assistance Equipment used: Rolling walker (2 wheeled) Transfers: Sit to/from Stand Sit to Stand: Min  guard         General transfer comment: min guard for safety and balance    Balance Overall balance assessment: Needs assistance Sitting-balance support: No upper extremity supported Sitting balance-Leahy Scale: Fair     Standing balance support: Bilateral upper extremity supported;During functional activity Standing balance-Leahy Scale: Poor Standing balance comment: relaint on UE support for balance                           ADL either performed or assessed with clinical judgement   ADL Overall ADL's : Needs assistance/impaired     Grooming: Min guard;Standing   Upper Body Bathing: Minimal assistance;Sitting Upper Body Bathing Details (indicate cue type and reason): d/t L shoulder pain Lower Body Bathing: Min guard;Sit to/from stand   Upper Body Dressing : Minimal assistance;Sitting   Lower Body Dressing: Sit to/from stand;Min guard   Toilet Transfer: Min guard;Ambulation;RW                   Vision Baseline Vision/History: Wears glasses Wears Glasses: At all times Patient Visual Report: No change from baseline Vision Assessment?: No apparent visual deficits     Perception     Praxis      Pertinent Vitals/Pain Pain Assessment: Faces Faces Pain Scale: Hurts even more Pain Location: L shoulder Pain Descriptors / Indicators: Aching;Sore Pain Intervention(s): Monitored during session;Repositioned     Hand Dominance Right   Extremity/Trunk Assessment Upper Extremity Assessment Upper Extremity Assessment: LUE deficits/detail LUE Deficits / Details: grossly weaker than R UE, injury to shoulder prior to stroke limiting shoulder ROM; WFL 3+/5 elbow and distal LUE Sensation: WNL LUE Coordination: decreased fine motor;decreased gross motor  Lower Extremity Assessment Lower Extremity Assessment: Defer to PT evaluation       Communication Communication Communication: No difficulties   Cognition Arousal/Alertness: Awake/alert Behavior  During Therapy: WFL for tasks assessed/performed Overall Cognitive Status: Within Functional Limits for tasks assessed                                     General Comments  encouraged L UE lap slides     Exercises     Shoulder Instructions      Home Living Family/patient expects to be discharged to:: Private residence Living Arrangements: Alone Available Help at Discharge: Family;Available PRN/intermittently Type of Home: House Home Access: Ramped entrance(w/c lift too)     Home Layout: One level     Bathroom Shower/Tub: Occupational psychologist: Handicapped height     Home Equipment: Environmental consultant - 2 wheels;Wheelchair - manual;Shower seat;Grab bars - toilet;Grab bars - tub/shower          Prior Functioning/Environment Level of Independence: Independent        Comments: drives and does cooking and cleaning, still doing it since shoulder injury, just lost husband in July due to suicide        OT Problem List: Decreased strength;Decreased range of motion;Decreased activity tolerance;Impaired balance (sitting and/or standing);Decreased coordination;Decreased knowledge of use of DME or AE;Decreased knowledge of precautions;Cardiopulmonary status limiting activity;Pain;Impaired UE functional use      OT Treatment/Interventions: Self-care/ADL training;DME and/or AE instruction;Therapeutic exercise;Cognitive remediation/compensation;Therapeutic activities;Balance training;Patient/family education    OT Goals(Current goals can be found in the care plan section) Acute Rehab OT Goals Patient Stated Goal: to go home OT Goal Formulation: With patient Time For Goal Achievement: 09/03/19 Potential to Achieve Goals: Good  OT Frequency: Min 2X/week   Barriers to D/C:            Co-evaluation              AM-PAC OT "6 Clicks" Daily Activity     Outcome Measure Help from another person eating meals?: None Help from another person taking care of  personal grooming?: A Little Help from another person toileting, which includes using toliet, bedpan, or urinal?: A Little Help from another person bathing (including washing, rinsing, drying)?: A Little Help from another person to put on and taking off regular upper body clothing?: A Little Help from another person to put on and taking off regular lower body clothing?: A Little 6 Click Score: 19   End of Session Equipment Utilized During Treatment: Rolling walker Nurse Communication: Mobility status  Activity Tolerance: Patient tolerated treatment well Patient left: with call bell/phone within reach;Other (comment)(seated EOB, with RT in room )  OT Visit Diagnosis: Other abnormalities of gait and mobility (R26.89);Pain;History of falling (Z91.81);Muscle weakness (generalized) (M62.81) Pain - Right/Left: Left Pain - part of body: Shoulder                Time: [id number] OT Time Calculation (min): 15 min Charges:  OT General Charges $OT Visit: 1 Visit OT Evaluation $OT Eval Moderate Complexity: 1 Mod  Jolaine Artist, OT Acute Rehabilitation Services Pager [phone] Office [phone]    Delight Stare 08/20/2019, 9:51 AM

## 2019-08-20 NOTE — Progress Notes (Signed)
Physical Therapy Treatment Patient Details Name: [name] [name] MRN: [id number] DOB: Jun 29, 1951 Today's Date: 08/20/2019    History of Present Illness Pt is a 69 y/o female with PMH of anxiety, osteoarthritis, CAD, stage II chronic kidney disease, COPD/chronic hypoxic respiratory failure on 3 L [place] at home in sleep,depression, T2DM, diverticulosis, history of duodenitis, GERD, hemorrhoids, hyperlipidemia, hypertension, insomnia, tinnitus, vertigo admitted with left-sided weakness since 1/7 morning.  MRI showing watershed strokes.    PT Comments    Pt seated EOB upon arrival, fully dressed and prepared to d/c home with her son. Pt participated in transfers, ambulation within her room, toileting activity and ADL task at sink. PT provided pt education re: "BE FAST". Pt would continue to benefit from skilled physical therapy services at this time while admitted and after d/c to address the below listed limitations in order to improve overall safety and independence with functional mobility.   Follow Up Recommendations  Home health PT;Supervision/Assistance - 24 hour     Equipment Recommendations  None recommended by PT    Recommendations for Other Services       Precautions / Restrictions Precautions Precautions: Fall Precaution Comments: tripped over lip going into a store and fell injuring L shoulder around thanksgiving Restrictions Weight Bearing Restrictions: No    Mobility  Bed Mobility Overal bed mobility: Needs Assistance Bed Mobility: Supine to Sit     Supine to sit: Supervision     General bed mobility comments: pt seated at EOB upon arrival  Transfers Overall transfer level: Needs assistance Equipment used: Rolling walker (2 wheeled) Transfers: Sit to/from Stand Sit to Stand: Min guard         General transfer comment: min guard for safety and balance  Ambulation/Gait Ambulation/Gait assistance: Min guard Gait Distance (Feet): 20 Feet Assistive device:  Rolling walker (2 wheeled) Gait Pattern/deviations: Step-to pattern;Decreased stride length;Decreased step length - left;Wide base of support Gait velocity: decreased   General Gait Details: pt with mild instability but no overt LOB or need for physical assistance min guard for safety   Stairs             Wheelchair Mobility    Modified Rankin (Stroke Patients Only) Modified Rankin (Stroke Patients Only) Pre-Morbid Rankin Score: No significant disability Modified Rankin: Moderately severe disability     Balance Overall balance assessment: Needs assistance Sitting-balance support: No upper extremity supported Sitting balance-Leahy Scale: Good     Standing balance support: Bilateral upper extremity supported;During functional activity Standing balance-Leahy Scale: Poor Standing balance comment: relaint on UE support for balance                            Cognition Arousal/Alertness: Awake/alert Behavior During Therapy: WFL for tasks assessed/performed Overall Cognitive Status: Within Functional Limits for tasks assessed                                        Exercises      General Comments General comments (skin integrity, edema, etc.): encouraged L UE lap slides       Pertinent Vitals/Pain Pain Assessment: Faces Faces Pain Scale: Hurts even more Pain Location: L shoulder Pain Descriptors / Indicators: Aching;Sore Pain Intervention(s): Monitored during session;Repositioned    Home Living Family/patient expects to be discharged to:: Private residence Living Arrangements: Alone Available Help at Discharge: Family;Available PRN/intermittently Type of Home:  House Home Access: Ramped entrance(w/c lift too)   Home Layout: One level Home Equipment: Walker - 2 wheels;Wheelchair - manual;Shower seat;Grab bars - toilet;Grab bars - tub/shower      Prior Function Level of Independence: Independent      Comments: drives and does  cooking and cleaning, still doing it since shoulder injury, just lost husband in July due to suicide   PT Goals (current goals can now be found in the care plan section) Acute Rehab PT Goals Patient Stated Goal: to go home PT Goal Formulation: With patient Time For Goal Achievement: 08/26/19 Potential to Achieve Goals: Good Progress towards PT goals: Progressing toward goals    Frequency    Min 4X/week      PT Plan Current plan remains appropriate    Co-evaluation              AM-PAC PT "6 Clicks" Mobility   Outcome Measure  Help needed turning from your back to your side while in a flat bed without using bedrails?: A Little Help needed moving from lying on your back to sitting on the side of a flat bed without using bedrails?: A Little Help needed moving to and from a bed to a chair (including a wheelchair)?: A Little Help needed standing up from a chair using your arms (e.g., wheelchair or bedside chair)?: A Little Help needed to walk in hospital room?: A Little Help needed climbing 3-5 steps with a railing? : A Little 6 Click Score: 18    End of Session   Activity Tolerance: Patient tolerated treatment well Patient left: in bed;with call bell/phone within reach;Other (comment)(seated EOB, fully dressed and ready to d/c home) Nurse Communication: Mobility status PT Visit Diagnosis: Other abnormalities of gait and mobility (R26.89);History of falling (Z91.81)     Time: [id number] PT Time Calculation (min) (ACUTE ONLY): 13 min  Charges:  $Therapeutic Activity: 8-22 mins                     Anastasio Champion, DPT  Acute Rehabilitation Services Pager [phone] Office [place] 08/20/2019, 12:41 PM

## 2019-08-20 NOTE — TOC Transition Note (Signed)
Transition of Care [facility]) - CM/SW Discharge Note   Patient Details  Name: [name] [name] MRN: [id number] Date of Birth: 1951-02-16  Transition of Care [facility]) CM/SW Contact:  Carles Collet, RN Phone Number: 08/20/2019, 10:07 AM   Clinical Narrative:    [place] arranged through [facility] per patient choice.     Final next level of care: [place] Barriers to Discharge: No Barriers Identified   Patient Goals and CMS Choice Patient states their goals for this hospitalization and ongoing recovery are:: to go home CMS Medicare.gov Compare Post Acute Care list provided to:: Patient Choice offered to / list presented to : Patient  Discharge Placement                       Discharge Plan and Services                          HH Arranged: PT [place] Agency: [place] Date [facility] Agency Contacted: 08/20/19 Time [place] [facility]: [id number] Representative spoke with at [place]: cory  Social Determinants of Health ([place]) Interventions     Readmission Risk Interventions No flowsheet data found.

## 2019-08-20 NOTE — Discharge Summary (Signed)
Physician Discharge Summary  [name] [name] [id number] DOB: 1950/12/25 DOA: 08/18/2019  PCP: Susy Frizzle, MD  Admit date: 08/18/2019 Discharge date: 08/20/2019  Admitted From: home Disposition:  HHPT  Recommendations for Outpatient Follow-up:  1. Follow up with PCP in 1-2 weeks 2. Please obtain BMP/CBC in one week 3. Please follow up on the following pending results:  Home Health:YES  Equipment/Devices: none  Discharge Condition: Stable Code Status: full Diet recommendation: Heart Healthy, diabetic   Brief/Interim Summary:  69y.o.F w/ history significant ofanxiety, osteoarthritis, CAD,stage IIchronic kidney disease, COPD/chronic hypoxic respiratory failure on 3 L [place] at home in sleep,depression, T2DM, diverticulosis, history of duodenitis, GERD, hemorrhoids, hyperlipidemia, hypertension, insomnia, tinnitus, vertigo admitted with left-sided weakness since 1/7 morning, with left leg dragging not getting better, who discussed with her PCP who also sent her to the ER. In the ER, blood work with hyperglycemia 328.  Anemia 11.9 g, albumin 3.1, MRI showed "multifocal acute ischemia within both hemispheres, right worse than left, predominantly in the watershed distribution. There was no hemorrhage or mass-effect. There is chronic ischemic angiopathy" Neurology was consulted and admitted. Patient underwent further stroke work-up with echocardiogram carotid ultrasound CT angio neck neurology evaluation PT OT eval So far work-up unremarkable. PT has advised home health PT patient would like to do return home with home health with family support She will continue aspirin and Plavix for 3 months then Plavix alone. Her ldl is Goal.  Discharge Diagnoses:  Principal Problem:   Acute cerebrovascular accident (CVA) ([place]) Active Problems:   Coronary atherosclerosis   Depression   GERD (gastroesophageal reflux disease)   Hypercholesteremia   Hypertension   Type 2 diabetes mellitus  (HCC)   COPD (chronic obstructive pulmonary disease) ([place])  Acute cerebrovascular accident, MRI showing watershed strokes, likely with generalized hypoperfusion event. has mild rt leg weakness but much better compared to admission.Appreciate neurology input CT angio head and neck shows a less than 50% stenosis of the proximal right internal carotid artery due to bulky atherosclerotic calcification, no proximal large vessel occlusion.  carotid ultrasounds 1 to 39% stenosis bilateral, echocardiogram normal EF no other acute findings,  A1c poorly controlled at 9.7, LDL at goal at 69.   pt reports taking regular aspirin at home. Per neuro cont aspirin 81 and plavix 75 mg for 3 months then Plavix alone, cont Lipitor.Completed stroke work-up PT has advised home health PT. patient will follow up with cardiology for 30-day event monitoring  CAD, EKG- normal sinus rhythm no acute ischemic changes.  On aspirin.  Depression: on Home SSRI  GERD: On PPI  Hypercholesteremia: ldl at goal  Hypertension: Blood pressure fairly controlled -Home antihypertensives  To resume on d/c  Type 2 diabetes mellitus with uncontrolled hyperglycemia, poorly controlled A1c 9.7 07/22/2019.   Upon discharge will resume home medication follow-up with PCP for diabetic management  COPD/chronic hypoxic respiratory failure on 3 L [place] at home in sleep.  Chronic pain patient reports taking hydrocodone at home and requesting.  Body mass index is 33.3 kg/m.    DVT prophylaxis: lovenox Code Status: full Family Communication: plan of care discussed with patient at bedside. Disposition Plan:  Home with home health.    Consultants: neuro Procedures:  CTA head/Neck  1Motion degraded examination, limiting assessment of the intracranial arteries. Within that limitation, there is no proximal large vessel occlusion. 2. 50% stenosis of the proximal right internal carotid artery due to bulky atherosclerotic  calcification. 3. Enlarged main pulmonary artery, suggesting pulmonary arterial hypertension.  4. Moderate atherosclerosis of the aortic arch and proximal arch Vessels.  Subjective: Walking better, rt leg still some walk but much improved.  Discharge Exam: Vitals:   08/20/19 0757 08/20/19 0858  BP: (!) 187/68   Pulse: 78   Resp: 16   Temp: 98.3 F (36.8 C)   SpO2: 94% 93%   General: Pt is alert, awake, not in acute distress Cardiovascular: RRR, S1/S2 +, no rubs, no gallops Respiratory: CTA bilaterally, no wheezing, no rhonchi Abdominal: Soft, NT, ND, bowel sounds + Extremities: no edema, no cyanosis Rt leg slightly weak.  Discharge Instructions  Discharge Instructions    Ambulatory referral to Neurology   Complete by: As directed    An appointment is requested in approximately: 2 Week For stroke follow-up   Diet - low sodium heart healthy   Complete by: As directed    Discharge instructions   Complete by: As directed    Please call call MD or return to ER for similar or worsening recurring problem that brought you to hospital or if any fever,nausea/vomiting,abdominal pain, uncontrolled pain, chest pain,  shortness of breath or any other alarming symptoms.  Please follow-up your doctor and neurology post stroke follow-up as instructed and call the office for appointment if you do not get a call from your office.  Please take aspirin for 3 weeks, continue Plavix indefinitely   Please avoid alcohol, smoking, or any other illicit substance and maintain healthy habits including taking your regular medications as prescribed.  You were cared for by a hospitalist during your hospital stay. If you have any questions about your discharge medications or the care you received while you were in the hospital after you are discharged, you can call the unit and ask to speak with the hospitalist on call if the hospitalist that took care of you is not available.  Once you are discharged,  your primary care physician will handle any further medical issues. Please note that NO REFILLS for any discharge medications will be authorized once you are discharged, as it is imperative that you return to your primary care physician (or establish a relationship with a primary care physician if you do not have one) for your aftercare needs so that they can reassess your need for medications and monitor your lab values   Increase activity slowly   Complete by: As directed      Allergies as of 08/20/2019      Reactions   Guaifenesin & Derivatives    Severe Reaction   Benazepril Other (See Comments)   cough   Latex    REACTION: powder on gloves   Morphine    REACTION: nausea   Zyrtec [cetirizine] Other (See Comments)   swelling      Medication List    TAKE these medications   albuterol 108 (90 Base) MCG/ACT inhaler Commonly known as: VENTOLIN HFA Inhale 2 puffs into the lungs every 4 (four) hours as needed for wheezing or shortness of breath.   aspirin 81 MG EC tablet Take 1 tablet (81 mg total) by mouth daily for 20 days.   atorvastatin 20 MG tablet Commonly known as: LIPITOR Take 1 tablet (20 mg total) by mouth daily.   bimatoprost 0.03 % ophthalmic solution Commonly known as: LUMIGAN Place 1 drop into both eyes at bedtime.   clopidogrel 75 MG tablet Commonly known as: PLAVIX Take 1 tablet (75 mg total) by mouth daily.   Dexilant 60 MG capsule Generic drug: dexlansoprazole Take 1 capsule (  60 mg total) by mouth daily.   diazepam 5 MG tablet Commonly known as: VALIUM Take 1 tablet (5 mg total) by mouth every 12 (twelve) hours as needed for anxiety (insomnia).   diltiazem 180 MG 24 hr tablet Commonly known as: CARDIZEM LA Take 1 tablet (180 mg total) by mouth daily.   ergocalciferol 1.25 MG (50000 UT) capsule Commonly known as: VITAMIN D2 Take 1 capsule (50,000 Units total) by mouth once a week. What changed: when to take this   escitalopram 10 MG  tablet Commonly known as: Lexapro Take 1 tablet (10 mg total) by mouth daily.   FreeStyle Libre 14 Day Sensor Misc CHANGE EVERY 14 DAYS   furosemide 40 MG tablet Commonly known as: LASIX Take 1 tablet (40 mg total) by mouth daily.   hydrochlorothiazide 25 MG tablet Commonly known as: HYDRODIURIL Take 1 tablet (25 mg total) by mouth daily.   HYDROcodone-acetaminophen 10-325 MG tablet Commonly known as: NORCO Take 1 tablet by mouth every 6 (six) hours as needed for moderate pain.   ipratropium-albuterol 0.5-2.5 (3) MG/3ML Soln Commonly known as: DUONEB INHALE 1 VIAL VIA NEBULIZER EVERY 6 HOURS AS NEEDED What changed:   how much to take  how to take this  when to take this  reasons to take this  additional instructions   losartan 100 MG tablet Commonly known as: COZAAR Take 1 tablet (100 mg total) by mouth daily.   metFORMIN 1000 MG tablet Commonly known as: GLUCOPHAGE Take 1 tablet (1,000 mg total) by mouth 2 (two) times daily with a meal.   OXYGEN Inhale 2 L into the lungs continuous.   rOPINIRole 2 MG tablet Commonly known as: REQUIP Take 1 tablet (2 mg total) by mouth 3 (three) times daily. What changed: when to take this   Tiotropium Bromide-Olodaterol 2.5-2.5 MCG/ACT Aers Inhale 2 puffs into the lungs daily.   Trelegy Ellipta 100-62.5-25 MCG/INH Aepb Generic drug: Fluticasone-Umeclidin-Vilant Inhale 1 Inhaler into the lungs daily. This replaces stiolto      Follow-up Information    Susy Frizzle, MD Follow up in 1 week(s).   Specialty: Family Medicine Contact information: [address] [place] [address] [phone]        [facility] Follow up in 2 week(s).   Specialty: Neurology Contact information: [place] [phone]         Allergies  Allergen Reactions  . Guaifenesin & Derivatives     Severe Reaction  . Benazepril Other (See Comments)     cough  . Latex     REACTION: powder on gloves  . Morphine     REACTION: nausea  . Zyrtec [Cetirizine] Other (See Comments)    swelling    The results of significant diagnostics from this hospitalization (including imaging, microbiology, ancillary and laboratory) are listed below for reference.    Microbiology: Recent Results (from the past 240 hour(s))  Respiratory Panel by RT PCR (Flu A&B, Covid) - Nasopharyngeal Swab     Status: None   Collection Time: 08/18/19  4:59 PM   Specimen: Nasopharyngeal Swab  Result Value Ref Range Status   SARS Coronavirus 2 by RT PCR NEGATIVE NEGATIVE Final    Comment: (NOTE) SARS-CoV-2 target nucleic acids are NOT DETECTED. The SARS-CoV-2 RNA is generally detectable in upper respiratoy specimens during the acute phase of infection. The lowest concentration of SARS-CoV-2 viral copies this assay can detect is 131 copies/mL. A negative result  does not preclude SARS-Cov-2 infection and should not be used as the sole basis for treatment or other patient management decisions. A negative result may occur with  improper specimen collection/handling, submission of specimen other than nasopharyngeal swab, presence of viral mutation(s) within the areas targeted by this assay, and inadequate number of viral copies (<131 copies/mL). A negative result must be combined with clinical observations, patient history, and epidemiological information. The expected result is Negative. Fact Sheet for Patients:  PinkCheek.be Fact Sheet for Healthcare Providers:  GravelBags.it This test is not yet ap proved or cleared by the [place] FDA and  has been authorized for detection and/or diagnosis of SARS-CoV-2 by FDA under an Emergency Use Authorization (EUA). This EUA will remain  in effect (meaning this test can be used) for the duration of the COVID-19 declaration under Section 564(b)(1) of the Act, 21  U.S.C. section 360bbb-3(b)(1), unless the authorization is terminated or revoked sooner.    Influenza A by PCR NEGATIVE NEGATIVE Final   Influenza B by PCR NEGATIVE NEGATIVE Final    Comment: (NOTE) The Xpert Xpress SARS-CoV-2/FLU/RSV assay is intended as an aid in  the diagnosis of influenza from Nasopharyngeal swab specimens and  should not be used as a sole basis for treatment. Nasal washings and  aspirates are unacceptable for Xpert Xpress SARS-CoV-2/FLU/RSV  testing. Fact Sheet for Patients: PinkCheek.be Fact Sheet for Healthcare Providers: GravelBags.it This test is not yet approved or cleared by the [place] FDA and  has been authorized for detection and/or diagnosis of SARS-CoV-2 by  FDA under an Emergency Use Authorization (EUA). This EUA will remain  in effect (meaning this test can be used) for the duration of the  Covid-19 declaration under Section 564(b)(1) of the Act, 21  U.S.C. section 360bbb-3(b)(1), unless the authorization is  terminated or revoked. Performed at [facility], [place] [address] [place], [place] [address]     Procedures/Studies: CT ANGIO HEAD W OR WO CONTRAST  Result Date: 08/19/2019 CLINICAL DATA:  Stroke follow-up EXAM: CT ANGIOGRAPHY HEAD AND NECK TECHNIQUE: Multidetector CT imaging of the head and neck was performed using the standard protocol during bolus administration of intravenous contrast. Multiplanar CT image reconstructions and MIPs were obtained to evaluate the vascular anatomy. Carotid stenosis measurements (when applicable) are obtained utilizing NASCET criteria, using the distal internal carotid diameter as the denominator. CONTRAST:  125mL OMNIPAQUE IOHEXOL 350 MG/ML SOLN COMPARISON:  None. FINDINGS: CTA NECK FINDINGS SKELETON: There is no bony spinal canal stenosis. No lytic or blastic lesion. OTHER NECK: Normal pharynx, larynx and major salivary glands. No cervical  lymphadenopathy. Unremarkable thyroid gland. UPPER CHEST: No pneumothorax or pleural effusion. No nodules or masses. AORTIC ARCH: There is moderate calcific atherosclerosis of the aortic arch. There is no aneurysm, dissection or hemodynamically significant stenosis of the visualized portion of the aorta. The main pulmonary artery is enlarged, measuring 3.7 cm. The visualized proximal subclavian arteries are widely patent. RIGHT CAROTID SYSTEM: No dissection, occlusion or aneurysm. There is mixed density atherosclerosis extending into the proximal ICA, resulting in 50% stenosis. LEFT CAROTID SYSTEM: Bulky calcification at the origin of the common carotid artery. No stenosis of the bifurcation. VERTEBRAL ARTERIES: Left dominant configuration. Limited visualization of the origins. There is no dissection, occlusion or flow-limiting stenosis to the skull base (V1-V3 segments). CTA HEAD FINDINGS Motion during image acquisition degrades the intracranial portion of the study, limiting assessment for stenoses. Within that limitation, there is no proximal large vessel occlusion. VENOUS SINUSES:  As permitted by contrast timing, patent. ANATOMIC VARIANTS: None Review of the MIP images confirms the above findings. IMPRESSION: 1. Motion degraded examination, limiting assessment of the intracranial arteries. Within that limitation, there is no proximal large vessel occlusion. 2. 50% stenosis of the proximal right internal carotid artery due to bulky atherosclerotic calcification. 3. Enlarged main pulmonary artery, suggesting pulmonary arterial hypertension. 4. Moderate atherosclerosis of the aortic arch and proximal arch vessels. Aortic Atherosclerosis (ICD10-I70.0). Electronically Signed   By: Ulyses Jarred M.D.   On: 08/19/2019 02:49   DG Chest 2 View  Result Date: 08/18/2019 CLINICAL DATA:  Weakness EXAM: CHEST - 2 VIEW COMPARISON:  07/22/2019 FINDINGS: Cardiomegaly. Both lungs are clear. Disc degenerative disease of the  thoracic spine. IMPRESSION: Cardiomegaly without acute abnormality of the lungs. Electronically Signed   By: Eddie Candle M.D.   On: 08/18/2019 16:27   DG Chest 2 View  Result Date: 07/22/2019 CLINICAL DATA:  Abnormal chest sounds. EXAM: CHEST - 2 VIEW COMPARISON:  August 31, 2018. FINDINGS: Stable cardiomediastinal silhouette. No pneumothorax or pleural effusion is noted. Both lungs are clear. The visualized skeletal structures are unremarkable. IMPRESSION: No active cardiopulmonary disease. Electronically Signed   By: Marijo Conception M.D.   On: 07/22/2019 16:49   CT Head Wo Contrast  Result Date: 08/18/2019 CLINICAL DATA:  Left-sided weakness for several hours EXAM: CT HEAD WITHOUT CONTRAST TECHNIQUE: Contiguous axial images were obtained from the base of the skull through the vertex without intravenous contrast. COMPARISON:  06/12/2005 FINDINGS: Brain: Mild atrophic changes are noted. No findings to suggest acute hemorrhage, acute infarction or space-occupying mass lesion are noted. Vascular: No hyperdense vessel or unexpected calcification. Skull: Normal. Negative for fracture or focal lesion. Sinuses/Orbits: No acute finding. Other: None. IMPRESSION: Mild atrophic changes without acute abnormality. Electronically Signed   By: Inez Catalina M.D.   On: 08/18/2019 18:50   CT ANGIO NECK W OR WO CONTRAST  Result Date: 08/19/2019 CLINICAL DATA:  Stroke follow-up EXAM: CT ANGIOGRAPHY HEAD AND NECK TECHNIQUE: Multidetector CT imaging of the head and neck was performed using the standard protocol during bolus administration of intravenous contrast. Multiplanar CT image reconstructions and MIPs were obtained to evaluate the vascular anatomy. Carotid stenosis measurements (when applicable) are obtained utilizing NASCET criteria, using the distal internal carotid diameter as the denominator. CONTRAST:  198mL OMNIPAQUE IOHEXOL 350 MG/ML SOLN COMPARISON:  None. FINDINGS: CTA NECK FINDINGS SKELETON: There is no  bony spinal canal stenosis. No lytic or blastic lesion. OTHER NECK: Normal pharynx, larynx and major salivary glands. No cervical lymphadenopathy. Unremarkable thyroid gland. UPPER CHEST: No pneumothorax or pleural effusion. No nodules or masses. AORTIC ARCH: There is moderate calcific atherosclerosis of the aortic arch. There is no aneurysm, dissection or hemodynamically significant stenosis of the visualized portion of the aorta. The main pulmonary artery is enlarged, measuring 3.7 cm. The visualized proximal subclavian arteries are widely patent. RIGHT CAROTID SYSTEM: No dissection, occlusion or aneurysm. There is mixed density atherosclerosis extending into the proximal ICA, resulting in 50% stenosis. LEFT CAROTID SYSTEM: Bulky calcification at the origin of the common carotid artery. No stenosis of the bifurcation. VERTEBRAL ARTERIES: Left dominant configuration. Limited visualization of the origins. There is no dissection, occlusion or flow-limiting stenosis to the skull base (V1-V3 segments). CTA HEAD FINDINGS Motion during image acquisition degrades the intracranial portion of the study, limiting assessment for stenoses. Within that limitation, there is no proximal large vessel occlusion. VENOUS SINUSES: As permitted by contrast timing, patent. ANATOMIC  VARIANTS: None Review of the MIP images confirms the above findings. IMPRESSION: 1. Motion degraded examination, limiting assessment of the intracranial arteries. Within that limitation, there is no proximal large vessel occlusion. 2. 50% stenosis of the proximal right internal carotid artery due to bulky atherosclerotic calcification. 3. Enlarged main pulmonary artery, suggesting pulmonary arterial hypertension. 4. Moderate atherosclerosis of the aortic arch and proximal arch vessels. Aortic Atherosclerosis (ICD10-I70.0). Electronically Signed   By: Ulyses Jarred M.D.   On: 08/19/2019 02:49   MR Brain Wo Contrast (neuro protocol)  Result Date:  08/18/2019 CLINICAL DATA:  Ataxia EXAM: MRI HEAD WITHOUT CONTRAST TECHNIQUE: Multiplanar, multiecho pulse sequences of the brain and surrounding structures were obtained without intravenous contrast. COMPARISON:  Head CT 08/18/2019 and brain MRI 06/12/2005 FINDINGS: Brain: There is multifocal acute ischemia within both hemispheres, predominantly in a watershed distribution. There is no hemorrhage or mass effect. Multifocal white matter hyperintensity, most commonly due to chronic ischemic microangiopathy. The cerebral and cerebellar volume are age-appropriate. There is no hydrocephalus. Blood-sensitive sequences show no chronic microhemorrhage or superficial siderosis. The midline structures are normal. Vascular: Normal flow voids. Skull and upper cervical spine: Normal marrow signal. Sinuses/Orbits: Negative. Other: None. IMPRESSION: 1. Multifocal acute ischemia within both hemispheres, right worse than left, predominantly in a watershed distribution. No hemorrhage or mass effect. 2. Chronic ischemic microangiopathy. Electronically Signed   By: Ulyses Jarred M.D.   On: 08/18/2019 22:43   ECHOCARDIOGRAM COMPLETE  Result Date: 08/19/2019   ECHOCARDIOGRAM REPORT   Patient Name:   [name] [name] Date of Exam: 08/19/2019 Medical Rec #:  [id number]       Height:       64.0 in Accession #:    [id number]      Weight:       194.0 lb Date of Birth:  1951/03/25      BSA:          1.93 m Patient Age:    40 years        BP:           188/86 mmHg Patient Gender: F               HR:           76 bpm. Exam Location:  Inpatient Procedure: 2D Echo Indications:    stroke 434.91  History:        Patient has prior history of Echocardiogram examinations, most                 recent 08/13/2017. COPD; Risk Factors:Hypertension, Dyslipidemia                 and Diabetes.  Sonographer:    Johny Chess Referring Phys: [id number] [place]  1. Left ventricular ejection fraction, by visual estimation, is 60 to 65%. The  left ventricle has normal function. There is mildly increased left ventricular hypertrophy.  2. Global right ventricle has normal systolic function.The right ventricular size is normal.  3. Left atrial size was normal.  4. Right atrial size was normal.  5. Mild mitral annular calcification.  6. The mitral valve is abnormal. Trivial mitral valve regurgitation.  7. The tricuspid valve is normal in structure.  8. The aortic valve was not well visualized. Aortic valve regurgitation is not visualized. No evidence of aortic valve stenosis.  9. The pulmonic valve was not well visualized. Pulmonic valve regurgitation is not visualized. 10. Trivial pericardial effusion 11. TR signal is inadequate for  assessing pulmonary artery systolic pressure. 12. The inferior vena cava is normal in size with greater than 50% respiratory variability, suggesting right atrial pressure of 3 mmHg. FINDINGS  Left Ventricle: Left ventricular ejection fraction, by visual estimation, is 60 to 65%. The left ventricle has normal function. The left ventricle has no regional wall motion abnormalities. There is mildly increased left ventricular hypertrophy. Left ventricular diastolic parameters were normal. Right Ventricle: The right ventricular size is normal. No increase in right ventricular wall thickness. Global RV systolic function is has normal systolic function. Left Atrium: Left atrial size was normal in size. Right Atrium: Right atrial size was normal in size Pericardium: Trivial pericardial effusion is present. Mitral Valve: The mitral valve is abnormal. Moderate mitral annular calcification. Trivial mitral valve regurgitation. Tricuspid Valve: The tricuspid valve is normal in structure. Tricuspid valve regurgitation is not demonstrated. Aortic Valve: The aortic valve was not well visualized. Aortic valve regurgitation is not visualized. The aortic valve is structurally normal, with no evidence of sclerosis or stenosis. Pulmonic Valve: The  pulmonic valve was not well visualized. Pulmonic valve regurgitation is not visualized. Pulmonic regurgitation is not visualized. Aorta: The aortic root is normal in size and structure. Venous: The inferior vena cava is normal in size with greater than 50% respiratory variability, suggesting right atrial pressure of 3 mmHg. IAS/Shunts: The interatrial septum was not well visualized.  LEFT VENTRICLE PLAX 2D LVIDd:         3.70 cm  Diastology LVIDs:         2.50 cm  LV e' lateral:   7.18 cm/s LV PW:         1.00 cm  LV E/e' lateral: 9.8 LV IVS:        0.80 cm  LV e' medial:    6.74 cm/s LVOT diam:     1.70 cm  LV E/e' medial:  10.5 LV SV:         36 ml LV SV Index:   17.65 LVOT Area:     2.27 cm  RIGHT VENTRICLE RV S prime:     12.00 cm/s TAPSE (M-mode): 2.0 cm LEFT ATRIUM             Index       RIGHT ATRIUM           Index LA diam:        3.70 cm 1.92 cm/m  RA Area:     10.10 cm LA Vol (A2C):   59.0 ml 30.55 ml/m RA Volume:   17.50 ml  9.06 ml/m LA Vol (A4C):   44.4 ml 22.99 ml/m LA Biplane Vol: 56.7 ml 29.36 ml/m  AORTIC VALVE LVOT Vmax:   84.90 cm/s LVOT Vmean:  57.900 cm/s LVOT VTI:    0.204 m  AORTA Ao Root diam: 2.80 cm MITRAL VALVE MV Area (PHT): 3.17 cm              SHUNTS MV PHT:        69.31 msec            Systemic VTI:  0.20 m MV Decel Time: 239 msec              Systemic Diam: 1.70 cm MV E velocity: 70.70 cm/s  103 cm/s MV A velocity: 102.00 cm/s 70.3 cm/s MV E/A ratio:  0.69        1.5  Oswaldo Milian MD Electronically signed by Oswaldo Milian MD Signature Date/Time: 08/19/2019/2:18:15 PM  Final    VAS US CAROTID  Result Date: 08/19/2019 Carotid Arterial Duplex Study Indications:       CVA. Comparison Study:  CTA head and neck 08/19/2019 Performing Technologist: Baldwin Crown RDMS, RVT  Examination Guidelines: A complete evaluation includes B-mode imaging, spectral Doppler, color Doppler, and power Doppler as needed of all accessible portions of each vessel. Bilateral testing is  considered an integral part of a complete examination. Limited examinations for reoccurring indications may be performed as noted.  Right Carotid Findings: +----------+--------+-------+--------+--------------------------------+--------+           PSV cm/sEDV    StenosisPlaque Description              Comments                   cm/s                                                    +----------+--------+-------+--------+--------------------------------+--------+ CCA Prox  65      21             smooth, heterogenous and                                                  calcific                                 +----------+--------+-------+--------+--------------------------------+--------+ CCA Distal68      27             smooth, heterogenous and                                                  calcific                                 +----------+--------+-------+--------+--------------------------------+--------+ ICA Prox  87      37             smooth and heterogenous                  +----------+--------+-------+--------+--------------------------------+--------+ ICA Mid   97      37                                                      +----------+--------+-------+--------+--------------------------------+--------+ ICA Distal119     45                                                      +----------+--------+-------+--------+--------------------------------+--------+ ECA       76      18                                                      +----------+--------+-------+--------+--------------------------------+--------+ +----------+--------+-------+--------+-------------------+  PSV cm/sEDV cmsDescribeArm Pressure (mmHG) +----------+--------+-------+--------+-------------------+ [id number]      0      Abnormal                    +----------+--------+-------+--------+-------------------+  +---------+--------+---+--------+----------+ VertebralPSV cm/s-51EDV cm/sRetrograde +---------+--------+---+--------+----------+  Left Carotid Findings: +----------+--------+-------+--------+----------------------+------------------+           PSV cm/sEDV    StenosisPlaque Description    Comments                             cm/s                                                    +----------+--------+-------+--------+----------------------+------------------+ CCA Prox  15      9              smooth and                                                                heterogenous                             +----------+--------+-------+--------+----------------------+------------------+ CCA Distal17      10             smooth and                                                                heterogenous                             +----------+--------+-------+--------+----------------------+------------------+ ICA Prox  57      26                                   intimal thickening +----------+--------+-------+--------+----------------------+------------------+ ICA Distal92      45                                                      +----------+--------+-------+--------+----------------------+------------------+ ECA       -103    13                                   Retrograde         +----------+--------+-------+--------+----------------------+------------------+ +----------+--------+--------+----------------+-------------------+           PSV cm/sEDV cm/sDescribe        Arm Pressure (mmHG) +----------+--------+--------+----------------+-------------------+ [id number]     0       Multiphasic, WNL                    +----------+--------+--------+----------------+-------------------+ +---------+--------+---+--------+--+---------+  [place] cm/s187EDV cm/s52Antegrade +---------+--------+---+--------+--+---------+  Summary:  Right Carotid: Velocities in the right ICA are consistent with a 1-39% stenosis. Left Carotid: Velocities in the left ICA are consistent with a 1-39% stenosis.               Diminished proximal CCA velocities, along with retrograde ECA is               suggestive of possible proximal stenosis. Vertebrals:  Left vertebral artery demonstrates antegrade flow. Right vertebral              artery demonstrates retrograde flow. This, along with atypical              right subclavian artery velocity, and atherosclerosis of the aortic              arch and proximal branches seen on CTA, is consistent with possible              proximal stenosis. Subclavians: Right subclavian artery was abnormal. Normal flow hemodynamics were              seen in the left subclavian artery. *See table(s) above for measurements and observations.  Electronically signed by Antony Contras MD on 08/19/2019 at 3:48:50 PM.    Final     Labs: BNP (last 3 results) Recent Labs    08/31/18 1103  BNP 55   Basic Metabolic Panel: Recent Labs  Lab 08/18/19 1542  NA 135  K 3.9  CL 102  CO2 25  GLUCOSE 328*  BUN 16  CREATININE 0.92  CALCIUM 9.0   Liver Function Tests: Recent Labs  Lab 08/18/19 1542  AST 24  ALT 32  ALKPHOS 52  BILITOT 0.3  PROT 6.8  ALBUMIN 3.1*   No results for input(s): LIPASE, AMYLASE in the last 168 hours. No results for input(s): AMMONIA in the last 168 hours. CBC: Recent Labs  Lab 08/18/19 1542  WBC 8.1  NEUTROABS 5.0  HGB 11.9*  HCT 36.8  MCV 88.5  PLT 275   Cardiac Enzymes: Recent Labs  Lab 08/18/19 1945  CKTOTAL 107   BNP: Invalid input(s): POCBNP CBG: Recent Labs  Lab 08/19/19 0747 08/19/19 1120 08/19/19 1658 08/19/19 2157 08/20/19 0641  GLUCAP 193* 139* 226* 154* 181*   D-Dimer No results for input(s): DDIMER in the last 72 hours. Hgb A1c Recent Labs    08/19/19 0500  HGBA1C 9.4*   Lipid Profile Recent Labs    08/19/19 0500  CHOL 136  HDL 53  LDLCALC 69   TRIG 70  CHOLHDL 2.6   Thyroid function studies No results for input(s): TSH, T4TOTAL, T3FREE, THYROIDAB in the last 72 hours.  Invalid input(s): FREET3 Anemia work up No results for input(s): VITAMINB12, FOLATE, FERRITIN, TIBC, IRON, RETICCTPCT in the last 72 hours. Urinalysis No results found for: COLORURINE, APPEARANCEUR, [place], [place], [place], [place], [place], [place], PROTEINUR, UROBILINOGEN, NITRITE, LEUKOCYTESUR Sepsis Labs Invalid input(s): PROCALCITONIN,  WBC,  LACTICIDVEN Microbiology Recent Results (from the past 240 hour(s))  Respiratory Panel by RT PCR (Flu A&B, Covid) - Nasopharyngeal Swab     Status: None   Collection Time: 08/18/19  4:59 PM   Specimen: Nasopharyngeal Swab  Result Value Ref Range Status   SARS Coronavirus 2 by RT PCR NEGATIVE NEGATIVE Final    Comment: (NOTE) SARS-CoV-2 target nucleic acids are NOT DETECTED. The SARS-CoV-2 RNA is generally detectable in upper respiratoy specimens during the acute phase of infection. The lowest concentration of SARS-CoV-2 viral copies  this assay can detect is 131 copies/mL. A negative result does not preclude SARS-Cov-2 infection and should not be used as the sole basis for treatment or other patient management decisions. A negative result may occur with  improper specimen collection/handling, submission of specimen other than nasopharyngeal swab, presence of viral mutation(s) within the areas targeted by this assay, and inadequate number of viral copies (<131 copies/mL). A negative result must be combined with clinical observations, patient history, and epidemiological information. The expected result is Negative. Fact Sheet for Patients:  PinkCheek.be Fact Sheet for Healthcare Providers:  GravelBags.it This test is not yet ap proved or cleared by the [place] FDA and  has been authorized for detection and/or diagnosis of SARS-CoV-2 by FDA  under an Emergency Use Authorization (EUA). This EUA will remain  in effect (meaning this test can be used) for the duration of the COVID-19 declaration under Section 564(b)(1) of the Act, 21 U.S.C. section 360bbb-3(b)(1), unless the authorization is terminated or revoked sooner.    Influenza A by PCR NEGATIVE NEGATIVE Final   Influenza B by PCR NEGATIVE NEGATIVE Final    Comment: (NOTE) The Xpert Xpress SARS-CoV-2/FLU/RSV assay is intended as an aid in  the diagnosis of influenza from Nasopharyngeal swab specimens and  should not be used as a sole basis for treatment. Nasal washings and  aspirates are unacceptable for Xpert Xpress SARS-CoV-2/FLU/RSV  testing. Fact Sheet for Patients: PinkCheek.be Fact Sheet for Healthcare Providers: GravelBags.it This test is not yet approved or cleared by the [place] FDA and  has been authorized for detection and/or diagnosis of SARS-CoV-2 by  FDA under an Emergency Use Authorization (EUA). This EUA will remain  in effect (meaning this test can be used) for the duration of the  Covid-19 declaration under Section 564(b)(1) of the Act, 21  U.S.C. section 360bbb-3(b)(1), unless the authorization is  terminated or revoked. Performed at [facility], [place] [address], [place], [place] [address]      Time coordinating discharge: 25 minutes  SIGNED: Antonieta Pert, MD  [facility] 08/20/2019, 9:10 AM  If 7PM-7AM, please contact night-coverage [email or web address]

## 2019-08-22 DIAGNOSIS — E785 Hyperlipidemia, unspecified: Secondary | ICD-10-CM | POA: Diagnosis not present

## 2019-08-22 DIAGNOSIS — J439 Emphysema, unspecified: Secondary | ICD-10-CM | POA: Diagnosis not present

## 2019-08-22 DIAGNOSIS — I2510 Atherosclerotic heart disease of native coronary artery without angina pectoris: Secondary | ICD-10-CM | POA: Diagnosis not present

## 2019-08-22 DIAGNOSIS — G4700 Insomnia, unspecified: Secondary | ICD-10-CM | POA: Diagnosis not present

## 2019-08-22 DIAGNOSIS — I1310 Hypertensive heart and chronic kidney disease without heart failure, with stage 1 through stage 4 chronic kidney disease, or unspecified chronic kidney disease: Secondary | ICD-10-CM | POA: Diagnosis not present

## 2019-08-22 DIAGNOSIS — I700 Atherosclerosis of aorta: Secondary | ICD-10-CM | POA: Diagnosis not present

## 2019-08-22 DIAGNOSIS — K5730 Diverticulosis of large intestine without perforation or abscess without bleeding: Secondary | ICD-10-CM | POA: Diagnosis not present

## 2019-08-22 DIAGNOSIS — M5134 Other intervertebral disc degeneration, thoracic region: Secondary | ICD-10-CM | POA: Diagnosis not present

## 2019-08-22 DIAGNOSIS — K219 Gastro-esophageal reflux disease without esophagitis: Secondary | ICD-10-CM | POA: Diagnosis not present

## 2019-08-22 DIAGNOSIS — F419 Anxiety disorder, unspecified: Secondary | ICD-10-CM | POA: Diagnosis not present

## 2019-08-22 DIAGNOSIS — I69354 Hemiplegia and hemiparesis following cerebral infarction affecting left non-dominant side: Secondary | ICD-10-CM | POA: Diagnosis not present

## 2019-08-22 DIAGNOSIS — Z9049 Acquired absence of other specified parts of digestive tract: Secondary | ICD-10-CM | POA: Diagnosis not present

## 2019-08-22 DIAGNOSIS — E1122 Type 2 diabetes mellitus with diabetic chronic kidney disease: Secondary | ICD-10-CM | POA: Diagnosis not present

## 2019-08-22 DIAGNOSIS — N182 Chronic kidney disease, stage 2 (mild): Secondary | ICD-10-CM | POA: Diagnosis not present

## 2019-08-22 DIAGNOSIS — I7789 Other specified disorders of arteries and arterioles: Secondary | ICD-10-CM | POA: Diagnosis not present

## 2019-08-22 DIAGNOSIS — G8929 Other chronic pain: Secondary | ICD-10-CM | POA: Diagnosis not present

## 2019-08-22 DIAGNOSIS — S4292XD Fracture of left shoulder girdle, part unspecified, subsequent encounter for fracture with routine healing: Secondary | ICD-10-CM | POA: Diagnosis not present

## 2019-08-22 DIAGNOSIS — Z7982 Long term (current) use of aspirin: Secondary | ICD-10-CM | POA: Diagnosis not present

## 2019-08-22 DIAGNOSIS — K649 Unspecified hemorrhoids: Secondary | ICD-10-CM | POA: Diagnosis not present

## 2019-08-22 DIAGNOSIS — H9319 Tinnitus, unspecified ear: Secondary | ICD-10-CM | POA: Diagnosis not present

## 2019-08-22 DIAGNOSIS — M170 Bilateral primary osteoarthritis of knee: Secondary | ICD-10-CM | POA: Diagnosis not present

## 2019-08-22 DIAGNOSIS — J9611 Chronic respiratory failure with hypoxia: Secondary | ICD-10-CM | POA: Diagnosis not present

## 2019-08-22 DIAGNOSIS — Z7984 Long term (current) use of oral hypoglycemic drugs: Secondary | ICD-10-CM | POA: Diagnosis not present

## 2019-08-22 DIAGNOSIS — F329 Major depressive disorder, single episode, unspecified: Secondary | ICD-10-CM | POA: Diagnosis not present

## 2019-08-22 DIAGNOSIS — E7800 Pure hypercholesterolemia, unspecified: Secondary | ICD-10-CM | POA: Diagnosis not present

## 2019-08-23 ENCOUNTER — Ambulatory Visit
Admission: RE | Admit: 2019-08-23 | Discharge: 2019-08-23 | Disposition: A | Discharge status: Home / Self Care | Payer: Medicare Other | Source: Ambulatory Visit | Attending: Family Medicine | Admitting: Family Medicine

## 2019-08-23 DIAGNOSIS — J449 Chronic obstructive pulmonary disease, unspecified: Secondary | ICD-10-CM

## 2019-08-23 DIAGNOSIS — R0602 Shortness of breath: Secondary | ICD-10-CM

## 2019-08-23 DIAGNOSIS — I77810 Thoracic aortic ectasia: Secondary | ICD-10-CM | POA: Diagnosis not present

## 2019-08-23 DIAGNOSIS — R0689 Other abnormalities of breathing: Secondary | ICD-10-CM

## 2019-08-23 DIAGNOSIS — I288 Other diseases of pulmonary vessels: Secondary | ICD-10-CM | POA: Diagnosis not present

## 2019-08-23 DIAGNOSIS — M47814 Spondylosis without myelopathy or radiculopathy, thoracic region: Secondary | ICD-10-CM | POA: Diagnosis not present

## 2019-08-23 DIAGNOSIS — R0902 Hypoxemia: Secondary | ICD-10-CM

## 2019-08-23 DIAGNOSIS — I2510 Atherosclerotic heart disease of native coronary artery without angina pectoris: Secondary | ICD-10-CM | POA: Diagnosis not present

## 2019-08-23 DIAGNOSIS — Z87891 Personal history of nicotine dependence: Secondary | ICD-10-CM | POA: Diagnosis not present

## 2019-08-23 DIAGNOSIS — I700 Atherosclerosis of aorta: Secondary | ICD-10-CM | POA: Diagnosis not present

## 2019-08-23 DIAGNOSIS — I2699 Other pulmonary embolism without acute cor pulmonale: Secondary | ICD-10-CM | POA: Diagnosis not present

## 2019-08-23 DIAGNOSIS — J438 Other emphysema: Secondary | ICD-10-CM | POA: Diagnosis not present

## 2019-08-23 MED ORDER — IOPAMIDOL (ISOVUE-300) INJECTION 61%
75.0000 mL | Freq: Once | INTRAVENOUS | Status: AC | PRN
  Administered 2019-08-23: 75 mL via INTRAVENOUS

## 2019-08-24 ENCOUNTER — Telehealth: Payer: Self-pay

## 2019-08-24 ENCOUNTER — Encounter: Payer: Self-pay | Admitting: *Deleted

## 2019-08-24 ENCOUNTER — Other Ambulatory Visit: Payer: Self-pay | Admitting: *Deleted

## 2019-08-24 DIAGNOSIS — I63429 Cerebral infarction due to embolism of unspecified anterior cerebral artery: Secondary | ICD-10-CM

## 2019-08-24 DIAGNOSIS — I2694 Multiple subsegmental pulmonary emboli without acute cor pulmonale: Secondary | ICD-10-CM

## 2019-08-24 MED ORDER — RIVAROXABAN (XARELTO) VTE STARTER PACK (15 & 20 MG): ORAL_TABLET | ORAL | 0 refills | Status: DC

## 2019-08-24 MED ORDER — RIVAROXABAN 20 MG PO TABS: 20.0000 mg | ORAL_TABLET | Freq: Every day | ORAL | 2 refills | Status: DC

## 2019-08-24 NOTE — Telephone Encounter (Signed)
Good afternoon Dr.Willis, i have Dr.Butte des Morts (CB#[phone]) calling with [facility] for [name] [name] on the line wanting to discuss a medication with you for the patient but she has not been seen in our office. Her appointment is feb 5th. as a new patient.   Please follow up

## 2019-08-24 NOTE — Telephone Encounter (Signed)
I called and spoke with patient.  Her CT of chest shows pulmonary embolism.  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  I called and consulted with her neurologist Dr. Jannifer Franklin.  Because she has a source of emboli we need to do a transcranial bubble study to rule out PFO.  This will be set up at [facility] and I have ordered this.  We will stop aspirin and Plavix and change her over to Xarelto.  I provided samples to get her started on Xarelto.  Her renal function is normal.  We will start her on 15 mg twice daily for 3 weeks then decrease to 20 mg once a day which she will remain on until further evaluation.  She will keep her appointment on Monday with PCP.  She will follow-up with neurology on the fifth.   Defer the other specifics of her CT scan to her PCP to review with her on Monday.

## 2019-08-24 NOTE — Telephone Encounter (Signed)
I called Dr. Buelah Manis.  The patient was recently in the hospital with onset of left-sided weakness, difficulty walking.  MRI of the brain shows evidence of bihemispheric embolic strokes, right greater than left hemisphere.  She was placed on aspirin and Plavix coming out of the hospital, she was on aspirin in the hospital.  Since being out of the hospital she was complaining of some shortness of breath, a work-up was shown evidence of pulmonary embolism.  The patient will need to come off of aspirin Plavix and be converted to Xarelto or another anticoagulant.  I have recommended getting a transcranial Doppler bubble study through [facility] to look for a PFO.

## 2019-08-24 NOTE — Patient Outreach (Signed)
[facility]) Care Management  08/24/2019  [name] [name] 1951/07/26 [id number]   Subjective: Telephone call to patient's home number, spoke with patient, and HIPAA verified.  Discussed [facility] Care Management Medicare EMMI Stroke Red Flag Alert follow up, patient voiced understanding, and is in agreement to follow up.  Patient states she is doing pretty good, feeling much better, and remembers receiving EMMI automated call.   States she fell the day after Thanksgiving, fractured left arm, was not diagnosed as being broken until several weeks later, because she did not realize it until she followed up with primary MD,  who referred her to orthopedic MD.   States she is finding it hard to recover from the stroke weakness and fractured left arm pain at the same time.  States she has discussed this with home health physical therapist and goal is to assist with strengthening.   States she is currently receiving home health services, started on 08/23/2019,  and services are going well.  States she has a follow up appointment with orthopedic MD on 09/07/2019, planning to discuss her stroke recovery concerns during appointment, and is aware to call provider for sooner appointment if needed.  States she wears a sling periodically on the left arm per MD order and is aware of signs / symptoms to report.  Patient states she has the following appointments: with primary MD on 1/48/2021 and with neurologist on 09/16/2019.  Patient states she is aware of signs/ symptoms to report, how to reach provider if needed after hours, when to go to ED, and / or call 911.  Patient voices understanding of medical diagnosis and treatment plan.  Patient states she is able to manage some self care and has assistance as needed.  States she is getting a walk in tub installed soon and feels this with assist her greatly with activities of daily living.  States she is accessing her Medicare benefits as needed via member services  number on back of card.    Discussed Advanced Directives, advised of [place] Management Social Worker  Advanced Directives document completion benefit, patient voices understanding, and in agreement to a referral to Education officer, museum will to ask Social Worker to send [employer], and follow up on document completion.  Patient states she does not have any education material, EMMI follow up, care coordination, care management, disease monitoring, transportation, or pharmacy needs at this time.  Patient in agreement to 1 additional follow up call to assess for further case management needs.  States she is very appreciative of the follow up and is in agreement to receive [place] Management EMMI follow up calls as needed.     Objective: Per KPN (Knowledge Performance Now, point of care tool) and chart review, patient  hospitalized 08/18/2019 - 08/20/2019 for acute CVA.  Patient has a history of COPD, diabetes, hypertension, chronic kidney disease, CAD, chronic hypoxic respiratory failure on 3 LNC at home in sleep,, osteoarthritis, stage IIchronic kidney disease, depression, diverticulosis, hyperlipidemia, hypertension, insomnia, tinnitus, and vertigo.     Assessment:  Received Medicare EMMI Stroke Red Flag Alert follow up referral on 08/23/2019.   Red Flag Triggers, Day #1, times 2, patient answered yes to the following question: Feeling worse overall? Patient answered no to the following question: Scheduled a follow-up appointment? EMMI follow up completed and will follow up to assess for additional care management needs.     Plan: RNCM will refer patient to [facility] Management Social Worker,  will  to ask Social Worker to send [employer], and follow up on document completion.  RNCM will call patient for telephone outreach attempt, within 10 business days, EMMI follow up, to assess for further CM needs, and proceed with case closure, within 10 business days if no return call.       Randell Teare H. Annia Friendly, BSN, [facility] Telephonic CM Phone: [phone] Fax: [phone]

## 2019-08-24 NOTE — Telephone Encounter (Signed)
[facility] Radiology called and Dr. Satira [name]  reports that the chest ct showed an acute pulmonary embolism and he feels she needs to get started on an anticoagulate. Dr. Satira [name] was told that Dr. Dennard Schaumann was not here and that I would report this to Dr. Buelah Manis. Please advise.

## 2019-08-25 ENCOUNTER — Encounter: Payer: Self-pay | Admitting: Family Medicine

## 2019-08-25 DIAGNOSIS — I2699 Other pulmonary embolism without acute cor pulmonale: Secondary | ICD-10-CM | POA: Insufficient documentation

## 2019-08-25 DIAGNOSIS — I639 Cerebral infarction, unspecified: Secondary | ICD-10-CM | POA: Insufficient documentation

## 2019-08-26 DIAGNOSIS — N182 Chronic kidney disease, stage 2 (mild): Secondary | ICD-10-CM | POA: Diagnosis not present

## 2019-08-26 DIAGNOSIS — I69354 Hemiplegia and hemiparesis following cerebral infarction affecting left non-dominant side: Secondary | ICD-10-CM | POA: Diagnosis not present

## 2019-08-26 DIAGNOSIS — I2510 Atherosclerotic heart disease of native coronary artery without angina pectoris: Secondary | ICD-10-CM | POA: Diagnosis not present

## 2019-08-26 DIAGNOSIS — E1122 Type 2 diabetes mellitus with diabetic chronic kidney disease: Secondary | ICD-10-CM | POA: Diagnosis not present

## 2019-08-26 DIAGNOSIS — I1310 Hypertensive heart and chronic kidney disease without heart failure, with stage 1 through stage 4 chronic kidney disease, or unspecified chronic kidney disease: Secondary | ICD-10-CM | POA: Diagnosis not present

## 2019-08-26 DIAGNOSIS — S4292XD Fracture of left shoulder girdle, part unspecified, subsequent encounter for fracture with routine healing: Secondary | ICD-10-CM | POA: Diagnosis not present

## 2019-08-29 ENCOUNTER — Inpatient Hospital Stay: Payer: Medicare Other | Admitting: Family Medicine

## 2019-08-29 ENCOUNTER — Ambulatory Visit (INDEPENDENT_AMBULATORY_CARE_PROVIDER_SITE_OTHER): Payer: Medicare Other | Admitting: Family Medicine

## 2019-08-29 ENCOUNTER — Other Ambulatory Visit: Payer: Self-pay

## 2019-08-29 ENCOUNTER — Encounter: Payer: Self-pay | Admitting: Family Medicine

## 2019-08-29 VITALS — BP 118/66 | HR 78 | Temp 98.2°F | Resp 16 | Ht 64.0 in | Wt 193.0 lb

## 2019-08-29 DIAGNOSIS — I712 Thoracic aortic aneurysm, without rupture, unspecified: Secondary | ICD-10-CM

## 2019-08-29 DIAGNOSIS — I2694 Multiple subsegmental pulmonary emboli without acute cor pulmonale: Secondary | ICD-10-CM

## 2019-08-29 DIAGNOSIS — R0602 Shortness of breath: Secondary | ICD-10-CM

## 2019-08-29 DIAGNOSIS — I639 Cerebral infarction, unspecified: Secondary | ICD-10-CM | POA: Diagnosis not present

## 2019-08-29 DIAGNOSIS — I63429 Cerebral infarction due to embolism of unspecified anterior cerebral artery: Secondary | ICD-10-CM | POA: Diagnosis not present

## 2019-08-29 NOTE — Progress Notes (Signed)
Subjective:    Patient ID: [name] [name], female    DOB: 16-May-1951, 69 y.o.   MRN: [id number]  HPI  Unfortunately, the patient was recently admitted to the hospital January 7 through January 9.  I have copied relevant portions of the discharge summary below for my reference:  Brief/Interim Summary:  68y.o.F w/history significant ofanxiety, osteoarthritis, CAD,stage IIchronic kidney disease, COPD/chronic hypoxic respiratory failure on 3 LNC at home in sleep,depression,T2DM, diverticulosis, history of duodenitis, GERD, hemorrhoids, hyperlipidemia, hypertension, insomnia, tinnitus, vertigoadmitted with left-sided weakness since 1/7 morning,with left leg dragging not getting better, whodiscussed with her PCP who also sent her to the ER. In the ER,blood work with hyperglycemia 328. Anemia 11.9 g, albumin 3.1, MRI showed "multifocal acute ischemia within both hemispheres, right worse than left, predominantly in the watershed distribution. There was no hemorrhage or mass-effect. There is chronic ischemic angiopathy" Neurology was consulted and admitted. Patient underwent further stroke work-up with echocardiogram carotid ultrasound CT angio neck neurology evaluation PT OT eval So far work-up unremarkable. PT has advised home health PT patient would like to do return home with home health with family support She will continue aspirin and Plavix for 3 months then Plavix alone. Her ldl is Goal.  Discharge Diagnoses:  Principal Problem:   Acute cerebrovascular accident (CVA) ([place]) Active Problems:   Coronary atherosclerosis   Depression   GERD (gastroesophageal reflux disease)   Hypercholesteremia   Hypertension   Type 2 diabetes mellitus ([place])   COPD (chronic obstructive pulmonary disease) ([place])  Acute cerebrovascular accident,MRI showing watershed strokes,likely with generalized hypoperfusion event.has mild rt leg weakness but much better compared to  admission.Appreciate neurology input CT angio head and neck shows a less than 50% stenosis of the proximal right internal carotid artery due to bulky atherosclerotic calcification, no proximal large vessel occlusion. carotid ultrasounds 1 to 39% stenosis bilateral, echocardiogram normal EF no other acute findings, A1c poorly controlled at 9.7, LDL at goal at 69. ptreports takingregular aspirin at home. Per neuro contaspirin 81 and plavix 75 mg for 3 months then Plavix alone, cont Lipitor.Completed stroke work-up PT has advised home health PT. patient will follow up with cardiology for 30-day event monitoring  CAD,EKG- normal sinus rhythm no acute ischemic changes. On aspirin.  Depression: onHome SSRI  GERD:On PPI  Hypercholesteremia:ldl at goal  Hypertension:Blood pressure fairly controlled -Home antihypertensives  To resume on d/c  Type 2 diabetes mellituswith uncontrolled hyperglycemia,poorly controlled A1c 9.7 07/22/2019.  Upon discharge will resume home medication follow-up with PCP for diabetic management  COPD/chronic hypoxic respiratory failure on 3 LNCat home in sleep.  Chronic painpatient reports taking hydrocodone at home and requesting.  Body mass index is 33.3 kg/m.   DVT prophylaxis: lovenox Code Status:full Family Communication:plan of care discussed with patient at bedside. Disposition Plan: Home with home health.    Consultants:neuro Procedures:  CTA head/Neck  1Motion degraded examination, limiting assessment of the intracranial arteries. Within that limitation, there is no proximal large vessel occlusion. 2. 50% stenosis of the proximal right internal carotid artery due to bulky atherosclerotic calcification. 3. Enlarged main pulmonary artery, suggesting pulmonary arterial hypertension. 4. Moderate atherosclerosis of the aortic arch and proximal arch Vessels.  Please see my last office visit, at that time the patient  was endorsing shortness of breath.  Therefore I obtained a CT scan of the lungs to evaluate for possible interstitial lung disease.  However the CT scan returned last week with a coincidental finding of acute bilateral pulmonary emboli!  IMPRESSION: 1. Acute bilateral segmental and subsegmental pulmonary emboli. 2. Stable dilated main pulmonary artery, suggesting chronic pulmonary arterial hypertension. 3. Stable ectatic 4.0 cm ascending thoracic aorta. Recommend annual imaging followup by CTA or MRA. This recommendation follows 2010 ACCF/AHA/AATS/ACR/ASA/SCA/SCAI/SIR/STS/SVM Guidelines for the Diagnosis and Management of Patients with Thoracic Aortic Disease. Circulation. 2010; [id number]. Aortic aneurysm NOS (ICD10-I71.9). 4. Three-vessel coronary atherosclerosis.  Patient was taken off aspirin and Plavix and was transitioned to Xarelto 15 mg twice daily for 21 days with the plan of switching her to 20 mg a day thereafter to complete 3 to 6 months of therapy.  The patient is here today to discuss.  Of note, the patient fractured her arm at the end of November.  This occurred after a fall.  I believe the patient may have developed a DVT after the fracture of her arm and then subsequently developed bilateral pulmonary emboli roughly at the same time she was found to have acute bilateral multifocal ischemia on her MRI.  This raises suspicion about a PFO.  We have already scheduled the patient for a bubble study.  Overall she is doing much better on the Xarelto.  She states that her breathing is improving.  She denies any lingering side effects of the stroke.  Her biggest issue is the pain in her shoulder from the fracture that she sustained.  Past Medical History:  Diagnosis Date  . Anxiety   . Arthritis   . CAD (coronary artery disease)   . Chronic kidney disease    Renal insufficiency  . COPD (chronic obstructive pulmonary disease) ([place])   . Depression   . Diabetes mellitus   .  Diverticulosis of colon (without mention of hemorrhage)   . Duodenitis without mention of hemorrhage   . Embolic stroke ([place])   . GERD (gastroesophageal reflux disease)   . Hemorrhoids   . Hypercholesteremia   . Hypertension   . Insomnia   . Pulmonary embolism ([place])   . Tinnitus   . Vertigo    Past Surgical History:  Procedure Laterality Date  . ANGIOPLASTY    . CHOLECYSTECTOMY    . ROTATOR CUFF REPAIR Right 04/2012  . TUBAL LIGATION     Current Outpatient Medications on File Prior to Visit  Medication Sig Dispense Refill  . albuterol (VENTOLIN HFA) 108 (90 Base) MCG/ACT inhaler Inhale 2 puffs into the lungs every 4 (four) hours as needed for wheezing or shortness of breath. 54 g 3  . aspirin EC 81 MG EC tablet Take 1 tablet (81 mg total) by mouth daily for 20 days. 20 tablet 0  . atorvastatin (LIPITOR) 20 MG tablet Take 1 tablet (20 mg total) by mouth daily. 90 tablet 3  . bimatoprost (LUMIGAN) 0.03 % ophthalmic solution Place 1 drop into both eyes at bedtime.    . brinzolamide (AZOPT) 1 % ophthalmic suspension 1 drop 2 (two) times daily.    . clopidogrel (PLAVIX) 75 MG tablet Take 1 tablet (75 mg total) by mouth daily. 30 tablet 1  . Continuous Blood Gluc Sensor (FREESTYLE LIBRE 14 DAY SENSOR) MISC CHANGE EVERY 14 DAYS 2 each 3  . dexlansoprazole (DEXILANT) 60 MG capsule Take 1 capsule (60 mg total) by mouth daily. 90 capsule 3  . diazepam (VALIUM) 5 MG tablet Take 1 tablet (5 mg total) by mouth every 12 (twelve) hours as needed for anxiety (insomnia). 60 tablet 1  . diltiazem (CARDIZEM LA) 180 MG 24 hr tablet Take 1 tablet (180 mg total)  by mouth daily. 30 tablet 1  . ergocalciferol (VITAMIN D2) 1.25 MG (50000 UT) capsule Take 1 capsule (50,000 Units total) by mouth once a week. (Patient taking differently: Take 50,000 Units by mouth every Tuesday. ) 24 capsule 0  . escitalopram (LEXAPRO) 10 MG tablet Take 1 tablet (10 mg total) by mouth daily. 90 tablet 3  .  Fluticasone-Umeclidin-Vilant (TRELEGY ELLIPTA) 100-62.5-25 MCG/INH AEPB Inhale 1 Inhaler into the lungs daily. This replaces stiolto 1 each 3  . furosemide (LASIX) 40 MG tablet Take 1 tablet (40 mg total) by mouth daily. 30 tablet 3  . hydrochlorothiazide (HYDRODIURIL) 25 MG tablet Take 1 tablet (25 mg total) by mouth daily. 90 tablet 1  . HYDROcodone-acetaminophen (NORCO) 10-325 MG per tablet Take 1 tablet by mouth every 6 (six) hours as needed for moderate pain.     Marland Kitchen ipratropium-albuterol (DUONEB) 0.5-2.5 (3) MG/3ML SOLN INHALE 1 VIAL VIA NEBULIZER EVERY 6 HOURS AS NEEDED (Patient taking differently: Take 3 mLs by nebulization every 6 (six) hours as needed (for breathing). ) 360 mL 0  . losartan (COZAAR) 100 MG tablet Take 1 tablet (100 mg total) by mouth daily. 90 tablet 3  . metFORMIN (GLUCOPHAGE) 1000 MG tablet Take 1 tablet (1,000 mg total) by mouth 2 (two) times daily with a meal. 60 tablet 1  . OXYGEN Inhale 2 L into the lungs continuous.    . Rivaroxaban 15 & 20 MG TBPK Follow package directions: Take one 15mg  tablet by mouth twice a day. On day 22, switch to one 20mg  tablet once a day. Take with food. 51 each 0  . rOPINIRole (REQUIP) 2 MG tablet Take 1 tablet (2 mg total) by mouth 3 (three) times daily. (Patient taking differently: Take 2 mg by mouth at bedtime. ) 270 tablet 3  . Tiotropium Bromide-Olodaterol (STIOLTO RESPIMAT) 2.5-2.5 MCG/ACT AERS Inhale into the lungs.    . Tiotropium Bromide-Olodaterol 2.5-2.5 MCG/ACT AERS Inhale 2 puffs into the lungs daily. 12 g 3  . rivaroxaban (XARELTO) 20 MG TABS tablet Take 1 tablet (20 mg total) by mouth daily with supper. (Patient not taking: Reported on 08/29/2019) 30 tablet 2   No current facility-administered medications on file prior to visit.   Allergies  Allergen Reactions  . Guaifenesin & Derivatives     Severe Reaction  . Benazepril Other (See Comments)    cough  . Latex     REACTION: powder on gloves  . Morphine     REACTION:  nausea  . Zyrtec [Cetirizine] Other (See Comments)    swelling   Social History   Socioeconomic History  . Marital status: Widowed    Spouse name: Not on file  . Number of children: 1  . Years of education: Not on file  . Highest education level: Not on file  Occupational History  . Occupation: Retired  Tobacco Use  . Smoking status: Former Smoker    Types: Cigarettes    Quit date: 08/12/2018    Years since quitting: 1.0  . Smokeless tobacco: Never Used  Substance and Sexual Activity  . Alcohol use: No    Alcohol/week: 0.0 standard drinks  . Drug use: No  . Sexual activity: Not on file  Other Topics Concern  . Not on file  Social History Narrative  . Not on file   Social Determinants of Health   Financial Resource Strain:   . Difficulty of Paying Living Expenses: Not on file  Food Insecurity:   . Worried About Running  Out of Food in the Last Year: Not on file  . Ran Out of Food in the Last Year: Not on file  Transportation Needs: No Transportation Needs  . Lack of Transportation (Medical): No  . Lack of Transportation (Non-Medical): No  Physical Activity:   . Days of Exercise per Week: Not on file  . Minutes of Exercise per Session: Not on file  Stress:   . Feeling of Stress : Not on file  Social Connections:   . Frequency of Communication with Friends and Family: Not on file  . Frequency of Social Gatherings with Friends and Family: Not on file  . Attends Religious Services: Not on file  . Active Member of Clubs or Organizations: Not on file  . Attends Archivist Meetings: Not on file  . Marital Status: Not on file  Intimate Partner Violence:   . Fear of Current or Ex-Partner: Not on file  . Emotionally Abused: Not on file  . Physically Abused: Not on file  . Sexually Abused: Not on file     Review of Systems     Objective:   Physical Exam Constitutional:      Appearance: Normal appearance.  Cardiovascular:     Rate and Rhythm: Normal rate  and regular rhythm.     Heart sounds: Normal heart sounds.  Pulmonary:     Effort: Pulmonary effort is normal. No respiratory distress.     Breath sounds: Normal breath sounds. No wheezing, rhonchi or rales.  Abdominal:     General: Abdomen is flat. Bowel sounds are normal. There is no distension.     Palpations: Abdomen is soft.     Tenderness: There is no abdominal tenderness. There is no guarding or rebound.  Musculoskeletal:     Right lower leg: No edema.     Left lower leg: No edema.  Neurological:     Mental Status: She is alert.           Assessment & Plan:  Multiple subsegmental pulmonary emboli without acute cor pulmonale (HCC)  Cerebrovascular accident (CVA) due to embolism of anterior cerebral artery, unspecified blood vessel laterality (HCC)  Shortness of breath  Thoracic aortic aneurysm without rupture (HCC)  First, I am awaiting the results of her echocardiogram with bubble study to determine if she has a patent foramen ovale.  If so this is likely the source of her embolic stroke.  I suspect that the patient formed a DVT and subsequently pulmonary emboli as well as embolic stroke due to the PFO.  The question is whether she requires a hypercoagulable work-up.  Patient likely had a provoked DVT due to her recent shoulder fracture in her left shoulder.  Orthopedics is managing the shoulder fracture.  Patient will complete 6 months of Xarelto for pulmonary embolism.  At that time we will discuss having her see hematology to complete a hypercoagulable work-up but I believe the fracture was likely the precipitating event.  Plan on rechecking the patient in 1 month to see how she is tolerating the Xarelto 20 mg a day and at that time we will discuss further management of her diabetes however the majority of today's office visit was spent explaining how the recent events are correlated.  I also explained the coincidental finding of a thoracic aortic aneurysm and that we need to  monitor this annually for any growth but at the present time requires no intervention.

## 2019-08-30 ENCOUNTER — Telehealth: Payer: Self-pay | Admitting: Neurology

## 2019-08-30 ENCOUNTER — Telehealth: Payer: Self-pay | Admitting: Radiology

## 2019-08-30 ENCOUNTER — Encounter: Payer: Self-pay | Admitting: Family Medicine

## 2019-08-30 ENCOUNTER — Ambulatory Visit (HOSPITAL_COMMUNITY)
Admission: RE | Admit: 2019-08-30 | Discharge: 2019-08-30 | Disposition: A | Discharge status: Home / Self Care | Payer: Medicare Other | Source: Ambulatory Visit | Attending: Family Medicine | Admitting: Family Medicine

## 2019-08-30 ENCOUNTER — Other Ambulatory Visit: Payer: Self-pay

## 2019-08-30 ENCOUNTER — Ambulatory Visit (HOSPITAL_BASED_OUTPATIENT_CLINIC_OR_DEPARTMENT_OTHER)
Admission: RE | Admit: 2019-08-30 | Discharge: 2019-08-30 | Disposition: A | Discharge status: Home / Self Care | Payer: Medicare Other | Source: Ambulatory Visit | Attending: Neurology | Admitting: Neurology

## 2019-08-30 ENCOUNTER — Other Ambulatory Visit (HOSPITAL_COMMUNITY): Payer: Self-pay | Admitting: Neurology

## 2019-08-30 DIAGNOSIS — I82402 Acute embolism and thrombosis of unspecified deep veins of left lower extremity: Secondary | ICD-10-CM

## 2019-08-30 DIAGNOSIS — I2510 Atherosclerotic heart disease of native coronary artery without angina pectoris: Secondary | ICD-10-CM | POA: Diagnosis not present

## 2019-08-30 DIAGNOSIS — I2694 Multiple subsegmental pulmonary emboli without acute cor pulmonale: Secondary | ICD-10-CM | POA: Diagnosis not present

## 2019-08-30 DIAGNOSIS — I63429 Cerebral infarction due to embolism of unspecified anterior cerebral artery: Secondary | ICD-10-CM | POA: Diagnosis not present

## 2019-08-30 DIAGNOSIS — E1122 Type 2 diabetes mellitus with diabetic chronic kidney disease: Secondary | ICD-10-CM | POA: Diagnosis not present

## 2019-08-30 DIAGNOSIS — N182 Chronic kidney disease, stage 2 (mild): Secondary | ICD-10-CM | POA: Diagnosis not present

## 2019-08-30 DIAGNOSIS — I69354 Hemiplegia and hemiparesis following cerebral infarction affecting left non-dominant side: Secondary | ICD-10-CM | POA: Diagnosis not present

## 2019-08-30 DIAGNOSIS — I1310 Hypertensive heart and chronic kidney disease without heart failure, with stage 1 through stage 4 chronic kidney disease, or unspecified chronic kidney disease: Secondary | ICD-10-CM | POA: Diagnosis not present

## 2019-08-30 DIAGNOSIS — I712 Thoracic aortic aneurysm, without rupture, unspecified: Secondary | ICD-10-CM | POA: Insufficient documentation

## 2019-08-30 DIAGNOSIS — S4292XD Fracture of left shoulder girdle, part unspecified, subsequent encounter for fracture with routine healing: Secondary | ICD-10-CM | POA: Diagnosis not present

## 2019-08-30 NOTE — Patient Outreach (Signed)
[facility]) Care Management  08/30/2019  [name] [name] [birth year] [id number]   Social work referral received today from [facility], Sonda Rumble, to send Advance Directive documentation and assist with completion.   Advance Directive Emmi and packet mailed today.  Will follow up within the next two weeks to ensure receipt and assist with completion if needed.  Ronn Melena, BSW Social Worker [phone]

## 2019-08-30 NOTE — Telephone Encounter (Signed)
The transcranial Doppler study does show a small PFO, likely not large enough to allow for emboli from right heart to left.  The patient is anticoagulated, has been demonstrated to have DVT.  May need to be evaluated in the future for hypercoagulable state.

## 2019-08-30 NOTE — Telephone Encounter (Signed)
Attempted to reach patient to verify her info/go over instruction for the cardiac event monitor that was ordered during her hospital stay.  Home phone rings and no voicemail Mobile goes directly to voicemail (no DPR on file)

## 2019-08-30 NOTE — Progress Notes (Signed)
TCD Bubble study and lower extremity venous have been completed.   Preliminary results in CV Proc.   Abram Sander 08/30/2019 2:36 PM

## 2019-08-31 DIAGNOSIS — I69354 Hemiplegia and hemiparesis following cerebral infarction affecting left non-dominant side: Secondary | ICD-10-CM | POA: Diagnosis not present

## 2019-08-31 DIAGNOSIS — N182 Chronic kidney disease, stage 2 (mild): Secondary | ICD-10-CM | POA: Diagnosis not present

## 2019-08-31 DIAGNOSIS — E1122 Type 2 diabetes mellitus with diabetic chronic kidney disease: Secondary | ICD-10-CM | POA: Diagnosis not present

## 2019-08-31 DIAGNOSIS — I2510 Atherosclerotic heart disease of native coronary artery without angina pectoris: Secondary | ICD-10-CM | POA: Diagnosis not present

## 2019-08-31 DIAGNOSIS — S4292XD Fracture of left shoulder girdle, part unspecified, subsequent encounter for fracture with routine healing: Secondary | ICD-10-CM | POA: Diagnosis not present

## 2019-08-31 DIAGNOSIS — I1310 Hypertensive heart and chronic kidney disease without heart failure, with stage 1 through stage 4 chronic kidney disease, or unspecified chronic kidney disease: Secondary | ICD-10-CM | POA: Diagnosis not present

## 2019-08-31 NOTE — Telephone Encounter (Signed)
Unable to reach patient. Phone rings and then hangs up

## 2019-09-01 NOTE — Telephone Encounter (Signed)
Thank you Dr. Jannifer Franklin. [name] [name],  Please tell patient that her neurologist (Dr. Jannifer Franklin) has reviewed the [place] of her heart and does not feel the hole is large enough to recommend repair.  I would like her to complete 6 months TOTAL of xarelto.  However, I would recommend that she see a hematologist for a hypercoagulable work up to make sure that there is not an issue in her blood causing her to form clots.   However, I think most likely she formed the DVT due to her recent fracture of her arm.  Please consult hematology/oncology

## 2019-09-02 DIAGNOSIS — I1310 Hypertensive heart and chronic kidney disease without heart failure, with stage 1 through stage 4 chronic kidney disease, or unspecified chronic kidney disease: Secondary | ICD-10-CM | POA: Diagnosis not present

## 2019-09-02 DIAGNOSIS — N182 Chronic kidney disease, stage 2 (mild): Secondary | ICD-10-CM | POA: Diagnosis not present

## 2019-09-02 DIAGNOSIS — E1122 Type 2 diabetes mellitus with diabetic chronic kidney disease: Secondary | ICD-10-CM | POA: Diagnosis not present

## 2019-09-02 DIAGNOSIS — I2510 Atherosclerotic heart disease of native coronary artery without angina pectoris: Secondary | ICD-10-CM | POA: Diagnosis not present

## 2019-09-02 DIAGNOSIS — I69354 Hemiplegia and hemiparesis following cerebral infarction affecting left non-dominant side: Secondary | ICD-10-CM | POA: Diagnosis not present

## 2019-09-02 DIAGNOSIS — S4292XD Fracture of left shoulder girdle, part unspecified, subsequent encounter for fracture with routine healing: Secondary | ICD-10-CM | POA: Diagnosis not present

## 2019-09-06 ENCOUNTER — Other Ambulatory Visit: Payer: Self-pay | Admitting: *Deleted

## 2019-09-06 ENCOUNTER — Other Ambulatory Visit: Payer: Self-pay | Admitting: Family Medicine

## 2019-09-06 DIAGNOSIS — D6859 Other primary thrombophilia: Secondary | ICD-10-CM

## 2019-09-06 NOTE — Patient Outreach (Addendum)
[facility]) Care Management  09/06/2019  [name] [name] Mar 09, 1951 [id number]   Subjective: Telephone call to patient's home number, no answer, no answering machine/ voicemail, and unable to leave a message.     Objective: Per KPN (Knowledge Performance Now, point of care tool) and chart review, patient  hospitalized 08/18/2019 - 08/20/2019 for acute CVA.  Patient has a history of COPD, diabetes, hypertension, chronic kidney disease, CAD, chronic hypoxic respiratory failure on 3 LNC at home in sleep,, osteoarthritis, stage IIchronic kidney disease, depression, diverticulosis, hyperlipidemia, hypertension, insomnia, tinnitus, and vertigo.     Assessment:  Received Medicare EMMI Stroke Red Flag Alert follow up referral on 08/23/2019.   Red Flag Triggers, Day #1, times 2, patient answered yes to the following question: Feeling worse overall? Patient answered no to the following question: Scheduled a follow-up appointment? EMMI follow up completed and will follow up to assess for additional care management needs.     Plan: RNCM has referred patient to [place] [facility] Social Worker,  Education officer, museum has sent [employer], and will follow up on document completion.  RNCM will call patient for telephone outreach attempt, within 4  business days, EMMI follow up, to assess for further CM needs, and proceed with case closure, within 10 business days if no return call.     Roxi Hlavaty H. Annia Friendly, BSN, [place] [facility] Telephonic CM Phone: [phone] Fax: [phone]

## 2019-09-07 ENCOUNTER — Emergency Department (HOSPITAL_COMMUNITY): Payer: Medicare Other

## 2019-09-07 ENCOUNTER — Emergency Department (HOSPITAL_COMMUNITY)
Admission: EM | Admit: 2019-09-07 | Discharge: 2019-09-07 | Disposition: A | Discharge status: Home / Self Care | Payer: Medicare Other | Attending: Emergency Medicine | Admitting: Emergency Medicine

## 2019-09-07 ENCOUNTER — Other Ambulatory Visit: Payer: Self-pay | Admitting: *Deleted

## 2019-09-07 ENCOUNTER — Encounter (HOSPITAL_COMMUNITY): Payer: Self-pay | Admitting: Emergency Medicine

## 2019-09-07 ENCOUNTER — Other Ambulatory Visit: Payer: Self-pay | Admitting: Family Medicine

## 2019-09-07 DIAGNOSIS — I2510 Atherosclerotic heart disease of native coronary artery without angina pectoris: Secondary | ICD-10-CM | POA: Diagnosis not present

## 2019-09-07 DIAGNOSIS — G4489 Other headache syndrome: Secondary | ICD-10-CM | POA: Diagnosis not present

## 2019-09-07 DIAGNOSIS — M25511 Pain in right shoulder: Secondary | ICD-10-CM | POA: Insufficient documentation

## 2019-09-07 DIAGNOSIS — Z7982 Long term (current) use of aspirin: Secondary | ICD-10-CM | POA: Insufficient documentation

## 2019-09-07 DIAGNOSIS — Z7902 Long term (current) use of antithrombotics/antiplatelets: Secondary | ICD-10-CM | POA: Diagnosis not present

## 2019-09-07 DIAGNOSIS — R2990 Unspecified symptoms and signs involving the nervous system: Secondary | ICD-10-CM | POA: Diagnosis not present

## 2019-09-07 DIAGNOSIS — R519 Headache, unspecified: Secondary | ICD-10-CM | POA: Insufficient documentation

## 2019-09-07 DIAGNOSIS — R29818 Other symptoms and signs involving the nervous system: Secondary | ICD-10-CM | POA: Diagnosis not present

## 2019-09-07 DIAGNOSIS — J449 Chronic obstructive pulmonary disease, unspecified: Secondary | ICD-10-CM | POA: Diagnosis not present

## 2019-09-07 DIAGNOSIS — R531 Weakness: Secondary | ICD-10-CM | POA: Insufficient documentation

## 2019-09-07 DIAGNOSIS — R29898 Other symptoms and signs involving the musculoskeletal system: Secondary | ICD-10-CM

## 2019-09-07 DIAGNOSIS — N189 Chronic kidney disease, unspecified: Secondary | ICD-10-CM | POA: Insufficient documentation

## 2019-09-07 DIAGNOSIS — Z7984 Long term (current) use of oral hypoglycemic drugs: Secondary | ICD-10-CM | POA: Diagnosis not present

## 2019-09-07 DIAGNOSIS — I129 Hypertensive chronic kidney disease with stage 1 through stage 4 chronic kidney disease, or unspecified chronic kidney disease: Secondary | ICD-10-CM | POA: Insufficient documentation

## 2019-09-07 DIAGNOSIS — E1122 Type 2 diabetes mellitus with diabetic chronic kidney disease: Secondary | ICD-10-CM | POA: Insufficient documentation

## 2019-09-07 DIAGNOSIS — Z87891 Personal history of nicotine dependence: Secondary | ICD-10-CM | POA: Diagnosis not present

## 2019-09-07 DIAGNOSIS — Z7901 Long term (current) use of anticoagulants: Secondary | ICD-10-CM | POA: Insufficient documentation

## 2019-09-07 DIAGNOSIS — Z8673 Personal history of transient ischemic attack (TIA), and cerebral infarction without residual deficits: Secondary | ICD-10-CM | POA: Diagnosis not present

## 2019-09-07 DIAGNOSIS — R200 Anesthesia of skin: Secondary | ICD-10-CM | POA: Insufficient documentation

## 2019-09-07 DIAGNOSIS — G9389 Other specified disorders of brain: Secondary | ICD-10-CM | POA: Diagnosis not present

## 2019-09-07 DIAGNOSIS — S42252A Displaced fracture of greater tuberosity of left humerus, initial encounter for closed fracture: Secondary | ICD-10-CM | POA: Diagnosis not present

## 2019-09-07 DIAGNOSIS — G2581 Restless legs syndrome: Secondary | ICD-10-CM | POA: Diagnosis not present

## 2019-09-07 DIAGNOSIS — Z91040 Latex allergy status: Secondary | ICD-10-CM | POA: Insufficient documentation

## 2019-09-07 DIAGNOSIS — R29810 Facial weakness: Secondary | ICD-10-CM | POA: Diagnosis not present

## 2019-09-07 DIAGNOSIS — Z79899 Other long term (current) drug therapy: Secondary | ICD-10-CM | POA: Insufficient documentation

## 2019-09-07 LAB — DIFFERENTIAL
Abs Immature Granulocytes: 0.07 10*3/uL (ref 0.00–0.07)
Basophils Absolute: 0.1 10*3/uL (ref 0.0–0.1)
Basophils Relative: 1 %
Eosinophils Absolute: 0.1 10*3/uL (ref 0.0–0.5)
Eosinophils Relative: 1 %
Immature Granulocytes: 1 %
Lymphocytes Relative: 27 %
Lymphs Abs: 3.3 10*3/uL (ref 0.7–4.0)
Monocytes Absolute: 0.8 10*3/uL (ref 0.1–1.0)
Monocytes Relative: 7 %
Neutro Abs: 7.7 10*3/uL (ref 1.7–7.7)
Neutrophils Relative %: 63 %

## 2019-09-07 LAB — COMPREHENSIVE METABOLIC PANEL
ALT: 30 U/L (ref 0–44)
AST: 28 U/L (ref 15–41)
Albumin: 3.5 g/dL (ref 3.5–5.0)
Alkaline Phosphatase: 49 U/L (ref 38–126)
Anion gap: 13 (ref 5–15)
BUN: 17 mg/dL (ref 8–23)
CO2: 25 mmol/L (ref 22–32)
Calcium: 9.1 mg/dL (ref 8.9–10.3)
Chloride: 99 mmol/L (ref 98–111)
Creatinine, Ser: 0.95 mg/dL (ref 0.44–1.00)
GFR calc Af Amer: >60 mL/min (ref >60)
GFR calc non Af Amer: >60 mL/min (ref >60)
Glucose, Bld: 142 mg/dL — ABNORMAL HIGH (ref 70–99)
Potassium: 3.3 mmol/L — ABNORMAL LOW (ref 3.5–5.1)
Sodium: 137 mmol/L (ref 135–145)
Total Bilirubin: 0.2 mg/dL — ABNORMAL LOW (ref 0.3–1.2)
Total Protein: 6.9 g/dL (ref 6.5–8.1)

## 2019-09-07 LAB — PROTIME-INR
INR: 1.2 (ref 0.8–1.2)
Prothrombin Time: 14.8 seconds (ref 11.4–15.2)

## 2019-09-07 LAB — APTT: aPTT: 28 seconds (ref 24–36)

## 2019-09-07 LAB — CBC
HCT: 40.5 % (ref 36.0–46.0)
HCT: 39.8 % (ref 36.0–46.0)
Hemoglobin: 12.7 g/dL (ref 12.0–15.0)
Hemoglobin: 12.8 g/dL (ref 12.0–15.0)
MCH: 28.2 pg (ref 26.0–34.0)
MCH: 28.6 pg (ref 26.0–34.0)
MCHC: 31.4 g/dL (ref 30.0–36.0)
MCHC: 32.2 g/dL (ref 30.0–36.0)
MCV: 90 fL (ref 80.0–100.0)
MCV: 89 fL (ref 80.0–100.0)
Platelets: 383 10*3/uL (ref 150–400)
Platelets: 340 10*3/uL (ref 150–400)
RBC: 4.5 MIL/uL (ref 3.87–5.11)
RBC: 4.47 MIL/uL (ref 3.87–5.11)
RDW: 15.6 % — ABNORMAL HIGH (ref 11.5–15.5)
RDW: 15.4 % (ref 11.5–15.5)
WBC: 11.7 10*3/uL — ABNORMAL HIGH (ref 4.0–10.5)
WBC: 12 10*3/uL — ABNORMAL HIGH (ref 4.0–10.5)
nRBC: 0 % (ref 0.0–0.2)
nRBC: 0 % (ref 0.0–0.2)

## 2019-09-07 LAB — BASIC METABOLIC PANEL
Anion gap: 13 (ref 5–15)
BUN: 18 mg/dL (ref 8–23)
CO2: 24 mmol/L (ref 22–32)
Calcium: 8.9 mg/dL (ref 8.9–10.3)
Chloride: 99 mmol/L (ref 98–111)
Creatinine, Ser: 0.96 mg/dL (ref 0.44–1.00)
GFR calc Af Amer: >60 mL/min (ref >60)
GFR calc non Af Amer: 60 mL/min — ABNORMAL LOW (ref >60)
Glucose, Bld: 151 mg/dL — ABNORMAL HIGH (ref 70–99)
Potassium: 2.9 mmol/L — ABNORMAL LOW (ref 3.5–5.1)
Sodium: 136 mmol/L (ref 135–145)

## 2019-09-07 LAB — URINALYSIS, ROUTINE W REFLEX MICROSCOPIC
Bilirubin Urine: NEGATIVE
Glucose, UA: NEGATIVE mg/dL
Hgb urine dipstick: NEGATIVE
Ketones, ur: NEGATIVE mg/dL
Leukocytes,Ua: NEGATIVE
Nitrite: NEGATIVE
Protein, ur: NEGATIVE mg/dL
Specific Gravity, Urine: 1.017 (ref 1.005–1.030)
pH: 6 (ref 5.0–8.0)

## 2019-09-07 LAB — RAPID URINE DRUG SCREEN, HOSP PERFORMED
Amphetamines: NOT DETECTED
Barbiturates: NOT DETECTED
Benzodiazepines: POSITIVE — AB
Cocaine: NOT DETECTED
Opiates: POSITIVE — AB
Tetrahydrocannabinol: NOT DETECTED

## 2019-09-07 LAB — CBG MONITORING, ED: Glucose-Capillary: 147 mg/dL — ABNORMAL HIGH (ref 70–99)

## 2019-09-07 LAB — I-STAT CHEM 8, ED
BUN: 19 mg/dL (ref 8–23)
Calcium, Ion: 1.08 mmol/L — ABNORMAL LOW (ref 1.15–1.40)
Chloride: 96 mmol/L — ABNORMAL LOW (ref 98–111)
Creatinine, Ser: 0.9 mg/dL (ref 0.44–1.00)
Glucose, Bld: 142 mg/dL — ABNORMAL HIGH (ref 70–99)
HCT: 39 % (ref 36.0–46.0)
Hemoglobin: 13.3 g/dL (ref 12.0–15.0)
Potassium: 3.2 mmol/L — ABNORMAL LOW (ref 3.5–5.1)
Sodium: 138 mmol/L (ref 135–145)
TCO2: 29 mmol/L (ref 22–32)

## 2019-09-07 LAB — ETHANOL: Alcohol, Ethyl (B): <10 mg/dL (ref <10)

## 2019-09-07 MED ORDER — SODIUM CHLORIDE 0.9% FLUSH: 3.0000 mL | Freq: Once | INTRAVENOUS | Status: DC

## 2019-09-07 MED ORDER — HYDROCODONE-ACETAMINOPHEN 5-325 MG PO TABS
1.0000 | ORAL_TABLET | ORAL | Status: AC
  Administered 2019-09-07: 1 via ORAL
  Filled 2019-09-07: qty 1

## 2019-09-07 NOTE — ED Notes (Signed)
Pt wheeled to and from [place]. Urine collected and sent by tech

## 2019-09-07 NOTE — Patient Outreach (Addendum)
[facility] [facility]) Care Management  09/07/2019  [name] [name] [birth year] [id number]   Subjective: Telephone call to patient's home number, no answer, no answering machine/ voicemail, and unable to leave a message.  Telephone call to patient's  mobile number, no answer, left HIPAA compliant voicemail message, and requested call back.     Objective:Per KPN (Knowledge Performance Now, point of care tool) and chart review,patient hospitalized 08/18/2019 - 08/20/2019 for acute CVA. Patient has a history of COPD, diabetes, hypertension, chronic kidney disease, CAD, chronic hypoxic respiratory failure on 3 LNC at home in sleep,,osteoarthritis,stage IIchronic kidney disease,depression,diverticulosis, hyperlipidemia, hypertension, insomnia, tinnitus,andvertigo.     Assessment: Received Medicare EMMI Stroke Red Flag Alert follow up referral on 08/23/2019. Red Flag Triggers, Day #1, times 2, patient answered yes to the following question: Feeling worse overall? Patient answered no to the following question:Scheduled a follow-up appointment?EMMI follow up completed andwill follow up to assess for additionalcare management needs.     Plan: RNCM has referred patient to [place] Worker has sent Advanced Directive packet, and will follow up on document completion.  RNCM will send unsuccessful outreach letter, [facility] pamphlet, handout: Know Before You Go, will call patient for 3rd telephone outreach attempt, within4 business days, EMMI follow up, to assess for further CM needs, and proceed with case closure, within 10 business days if no return call.     Sri Clegg H. Annia Friendly, BSN, [place] [facility] Telephonic CM Phone: [phone] Fax: [phone]

## 2019-09-07 NOTE — ED Notes (Signed)
Assumed care of pt. Pt back from MRI without incidence. Alert, breathing non-labored. Speaking in full sentences. Placed back on continuous monitors. Call light within reach

## 2019-09-07 NOTE — ED Notes (Signed)
MD at bedside. 

## 2019-09-07 NOTE — ED Provider Notes (Signed)
[facility] EMERGENCY DEPARTMENT Provider Note   CSN: [id number] Arrival date & time: 09/07/19  1555     History Chief Complaint  Patient presents with  . Weakness    [name] [name] is a 69 y.o. female.  Patient is a 69 year old female with extensive past medical history including coronary artery disease, COPD, chronic renal insufficiency, hypertension, diabetes, and recent admission for stroke.  Patient had issues with her right arm and right leg and was found to have ischemic findings on her imaging studies.  Patient in the hospital for several days, then discharged with Xarelto.  This morning patient was at the orthopedic office having her left shoulder evaluated when she developed sudden onset of left leg weakness.  Patient stated that she was unable to stand because this leg was giving out on her.  She also noted some weakness in her left arm.  Her symptoms from prior admission were involving the right side.  She also describes a headache since this morning, but denies any injury or trauma.  The history is provided by the patient.  Weakness Severity:  Moderate Onset quality:  Sudden Duration:  5 hours Timing:  Constant Progression:  Unchanged Chronicity:  New Relieved by:  Nothing Worsened by:  Nothing Ineffective treatments:  None tried      Past Medical History:  Diagnosis Date  . Aneurysm, thoracic aortic ([place])   . Anxiety   . Arthritis   . CAD (coronary artery disease)   . Chronic kidney disease    Renal insufficiency  . COPD (chronic obstructive pulmonary disease) ([place])   . Depression   . Diabetes mellitus   . Diverticulosis of colon (without mention of hemorrhage)   . Duodenitis without mention of hemorrhage   . Embolic stroke ([place])   . GERD (gastroesophageal reflux disease)   . Hemorrhoids   . Hypercholesteremia   . Hypertension   . Insomnia   . Pulmonary embolism ([place])   . Tinnitus   . Vertigo     Patient Active Problem List    Diagnosis Date Noted  . Aneurysm, thoracic aortic ([place])   . Embolic stroke ([place])   . Pulmonary embolism ([place])   . Acute cerebrovascular accident (CVA) ([place]) 08/18/2019  . COPD (chronic obstructive pulmonary disease) ([place])   . COPD exacerbation ([place]) 08/12/2018  . Hyponatremia 08/12/2018  . Acute respiratory failure with hypoxia ([place]) 10/26/2015  . CAP (community acquired pneumonia) 10/26/2015  . Type 2 diabetes mellitus ([place]) 10/26/2015  . Overactive bladder 09/10/2015  . Diabetes mellitus type 2, controlled, without complications ([place])   . Depression   . Insomnia   . GERD (gastroesophageal reflux disease)   . Hypercholesteremia   . Hypertension   . Chronic kidney disease   . Overweight 10/29/2009  . GERD 06/20/2008  . CHEST PAIN 06/20/2008  . CHOLECYSTECTOMY, HX OF 06/20/2008  . HYPERLIPIDEMIA 12/11/2007  . TOBACCO ABUSE 12/11/2007  . DEPRESSION 12/11/2007  . Essential hypertension 12/11/2007  . Coronary atherosclerosis 12/11/2007  . DUODENITIS, WITH HEMORRHAGE 12/11/2007    Past Surgical History:  Procedure Laterality Date  . ANGIOPLASTY    . CHOLECYSTECTOMY    . ROTATOR CUFF REPAIR Right 04/2012  . TUBAL LIGATION       OB History   No obstetric history on file.     Family History  Problem Relation Age of Onset  . Diabetes Mother   . Hemophilia Mother   . Breast cancer Sister   . Heart disease Father   .  Colon polyps Father   . Diabetes Brother   . Colon polyps Sister     Social History   Tobacco Use  . Smoking status: Former Smoker    Types: Cigarettes    Quit date: 08/12/2018    Years since quitting: 1.0  . Smokeless tobacco: Never Used  Substance Use Topics  . Alcohol use: No    Alcohol/week: 0.0 standard drinks  . Drug use: No    Home Medications Prior to Admission medications   Medication Sig Start Date End Date Taking? Authorizing Provider  albuterol (VENTOLIN HFA) 108 (90 Base) MCG/ACT inhaler Inhale 2 puffs into the lungs every 4  (four) hours as needed for wheezing or shortness of breath. 06/07/19   Susy Frizzle, MD  aspirin EC 81 MG EC tablet Take 1 tablet (81 mg total) by mouth daily for 20 days. 08/20/19 09/09/19  Antonieta Pert, MD  atorvastatin (LIPITOR) 20 MG tablet Take 1 tablet (20 mg total) by mouth daily. 04/29/19   Susy Frizzle, MD  bimatoprost (LUMIGAN) 0.03 % ophthalmic solution Place 1 drop into both eyes at bedtime.    [provider]  brinzolamide (AZOPT) 1 % ophthalmic suspension 1 drop 2 (two) times daily.    [provider]  clopidogrel (PLAVIX) 75 MG tablet Take 1 tablet (75 mg total) by mouth daily. 08/20/19 10/19/19  Antonieta Pert, MD  Continuous Blood Gluc Sensor (FREESTYLE LIBRE 14 DAY SENSOR) MISC CHANGE EVERY 14 DAYS 07/26/19   Susy Frizzle, MD  dexlansoprazole (DEXILANT) 60 MG capsule Take 1 capsule (60 mg total) by mouth daily. 04/27/19   Susy Frizzle, MD  diazepam (VALIUM) 5 MG tablet Take 1 tablet (5 mg total) by mouth every 12 (twelve) hours as needed for anxiety (insomnia). 06/07/19   Susy Frizzle, MD  diltiazem (CARDIZEM LA) 180 MG 24 hr tablet Take 1 tablet (180 mg total) by mouth daily. 07/04/19   Alycia Rossetti, MD  ergocalciferol (VITAMIN D2) 1.25 MG (50000 UT) capsule Take 1 capsule (50,000 Units total) by mouth once a week. Patient taking differently: Take 50,000 Units by mouth every Tuesday.  05/24/19   Susy Frizzle, MD  escitalopram (LEXAPRO) 10 MG tablet Take 1 tablet (10 mg total) by mouth daily. 06/07/19   Susy Frizzle, MD  Fluticasone-Umeclidin-Vilant (TRELEGY ELLIPTA) 100-62.5-25 MCG/INH AEPB Inhale 1 Inhaler into the lungs daily. This replaces stiolto 08/09/19   Susy Frizzle, MD  furosemide (LASIX) 40 MG tablet Take 1 tablet (40 mg total) by mouth daily. 08/09/19   Susy Frizzle, MD  HYDROcodone-acetaminophen (NORCO) 10-325 MG per tablet Take 1 tablet by mouth every 6 (six) hours as needed for moderate pain.     [provider]  ipratropium-albuterol (DUONEB) 0.5-2.5 (3) MG/3ML SOLN INHALE 1 VIAL VIA NEBULIZER EVERY 6 HOURS AS NEEDED Patient taking differently: Take 3 mLs by nebulization every 6 (six) hours as needed (for breathing).  10/25/18   Susy Frizzle, MD  losartan (COZAAR) 100 MG tablet Take 1 tablet (100 mg total) by mouth daily. 04/27/19   Susy Frizzle, MD  metFORMIN (GLUCOPHAGE) 1000 MG tablet Take 1 tablet (1,000 mg total) by mouth 2 (two) times daily with a meal. 08/19/19   Susy Frizzle, MD  OXYGEN Inhale 2 L into the lungs continuous.    [provider]  rivaroxaban (XARELTO) 20 MG TABS tablet Take 1 tablet (20 mg total) by mouth daily with supper. Patient not taking: Reported  on 08/29/2019 08/24/19   Alycia Rossetti, MD  Rivaroxaban 15 & 20 MG TBPK Follow package directions: Take one 15mg  tablet by mouth twice a day. On day 22, switch to one 20mg  tablet once a day. Take with food. 08/24/19   Hart, Modena Nunnery, MD  rOPINIRole (REQUIP) 2 MG tablet Take 1 tablet (2 mg total) by mouth 3 (three) times daily. Patient taking differently: Take 2 mg by mouth at bedtime.  04/27/19   Susy Frizzle, MD  Tiotropium Bromide-Olodaterol (STIOLTO RESPIMAT) 2.5-2.5 MCG/ACT AERS Inhale into the lungs.    [provider]  Tiotropium Bromide-Olodaterol 2.5-2.5 MCG/ACT AERS Inhale 2 puffs into the lungs daily. 06/07/19   Susy Frizzle, MD    Allergies    Guaifenesin & derivatives, Benazepril, Latex, Morphine, and Zyrtec [cetirizine]  Review of Systems   Review of Systems  Neurological: Positive for weakness.  All other systems reviewed and are negative.   Physical Exam Updated Vital Signs There were no vitals taken for this visit.  Physical Exam Vitals and nursing note reviewed.  Constitutional:      General: She is not in acute distress.    Appearance: She is well-developed. She is not diaphoretic.  HENT:     Head: Normocephalic and atraumatic.  Cardiovascular:      Rate and Rhythm: Normal rate and regular rhythm.     Heart sounds: No murmur. No friction rub. No gallop.   Pulmonary:     Effort: Pulmonary effort is normal. No respiratory distress.     Breath sounds: Normal breath sounds. No wheezing.  Abdominal:     General: Bowel sounds are normal. There is no distension.     Palpations: Abdomen is soft.     Tenderness: There is no abdominal tenderness.  Musculoskeletal:        General: Normal range of motion.     Cervical back: Normal range of motion and neck supple.  Skin:    General: Skin is warm and dry.  Neurological:     Mental Status: She is alert and oriented to person, place, and time.     Comments: Patient is awake, alert, and appropriate.  She has 4+ out of 5 strength with handgrip of the left arm and 5 out of 5 on the right.  Her left leg has 3+ out of 5 strength with knee flexion and extension and ankle flexion and extension.  Sensation is intact.     ED Results / Procedures / Treatments   Labs (all labs ordered are listed, but only abnormal results are displayed) Labs Reviewed  CBC - Abnormal; Notable for the following components:      Result Value   WBC 11.7 (*)    RDW 15.6 (*)    All other components within normal limits  BASIC METABOLIC PANEL  URINALYSIS, ROUTINE W REFLEX MICROSCOPIC  ETHANOL  PROTIME-INR  APTT  CBC  DIFFERENTIAL  COMPREHENSIVE METABOLIC PANEL  RAPID URINE DRUG SCREEN, HOSP PERFORMED  URINALYSIS, ROUTINE W REFLEX MICROSCOPIC  CBG MONITORING, ED  I-STAT CHEM 8, ED    EKG None  Radiology No results found.  Procedures Procedures (including critical care time)  Medications Ordered in ED Medications  sodium chloride flush (NS) 0.9 % injection 3 mL (has no administration in time range)    ED Course  I have reviewed the triage vital signs and the nursing notes.  Pertinent labs & imaging results that were available during my care of the patient were reviewed  by me and considered in my  medical decision making (see chart for details).    MDM Rules/Calculators/A&P  Patient is a 69 year old female with history of recent stroke presenting with complaints of left leg and left arm weakness.  This started while she was at the orthopedic office of having her shoulder evaluated.  Code stroke was initiated upon patient's arrival to the ER.  She went for an emergent head CT which was read as no acute stroke, but did show findings associated with her prior stroke.  Laboratory studies were obtained and the patient was evaluated by neurology.  They are recommending an MRI which has been obtained.  This shows no acute stroke.  The remainder of the patient's work-up is unremarkable including laboratory studies, urinalysis, EKG, and chest x-ray.  At this point, patient appropriate for discharge.  She is to follow-up with her primary doctor.  Final Clinical Impression(s) / ED Diagnoses Final diagnoses:  None    Rx / DC Orders ED Discharge Orders    None       Veryl Speak, MD 09/07/19 2105

## 2019-09-07 NOTE — Consult Note (Addendum)
Neurology Consultation  Reason for Consult: Code stroke Referring Physician: Veryl Speak, MD  CC: Code stroke with left-sided leg weakness  History is obtained from: Son and patient  HPI: [name] [name] is a 69 y.o. female with history of pulmonary embolism on Xarelto, hypertension, hypercholesterolemia, stroke in bilateral watershed distributions in early January with some residual left-sided weakness, diabetes, CAD.   Patient apparently went to her orthopedics office today and it was noticed that she had left-sided leg weakness and difficulty getting on the x-ray bed thus, EMS was called. Patient arrived to the ED at approximately 3:56 PM.  However her last seen normal was 11 AM.  Due to the left leg weakness numbness code stroke was called at 4:30 PM after EDP evaluation due to patient reporting new left-sided weakness.  Patient was brought from ED room to CT scan to evaluate for new acute stroke, mass or bleed.  In discussing with patient about her previous stroke, she stated that "she had no further residual symptoms secondary to her previous stroke"  However, after discussing with patient's son, he stated that "she had residual left leg weakness and numbness".  In fact, this Saturday he noted that she was much weaker on her left leg to the point where he needed to assist her at times and she needed to use assistance with a cane.  She has been also complaining of headache On exam and CT room patient showed the exact symptoms to which son was mentioning.     ED course  Relevant labs include -potassium 2.9, glucose 151, WBC 11.7, CT head shows-Multifocal subacute watershed infarcts again demonstrated within the bilateral cerebral hemispheres as well as a small subacute right occipital lobe infarct.No new demarcated infarct is identified. No evidence of acute intracranial hemorrhage  Chart review-patient was seen on 08/19/2019.  At that time she presented with sudden onset of left-sided  weakness with dragging of left leg 2 days prior to being evaluated.  MRI scan showed multiple bilateral scattered embolic strokes right side more than left side and the pseudowatershed distribution.  CT angiogram did not show any significant intracranial or extracranial stenosis.  It did show aortic arch and proximal subclavian and right carotid bifurcation moderate stenosis.  HbA1c was 9.4, LDL was 98.  2D echo showed an EF of 60-65% with no source of embolus.  On exam patient did have left hip flexor and ankle dorsiflexion weakness of 4/5.  On 08/24/2019-diagnosed with PE when she presented to the hospital with shortness of breath.  Started on Xarelto.  Dual antiplatelet stopped.    On 08/30/2019-TCD with bubbles revealed a possible small PFO.  Continued on anticoagulation.   LKW: 11 AM tpa given?:  Recent stroke and also on Xarelto Premorbid modified Rankin scale (mRS): 4 NIH stroke scale of 2   Past Medical History:  Diagnosis Date  . Aneurysm, thoracic aortic ([place])   . Anxiety   . Arthritis   . CAD (coronary artery disease)   . Chronic kidney disease    Renal insufficiency  . COPD (chronic obstructive pulmonary disease) ([place])   . Depression   . Diabetes mellitus   . Diverticulosis of colon (without mention of hemorrhage)   . Duodenitis without mention of hemorrhage   . Embolic stroke ([place])   . GERD (gastroesophageal reflux disease)   . Hemorrhoids   . Hypercholesteremia   . Hypertension   . Insomnia   . Pulmonary embolism ([place])   . Tinnitus   . Vertigo  Family History  Problem Relation Age of Onset  . Diabetes Mother   . Hemophilia Mother   . Breast cancer Sister   . Heart disease Father   . Colon polyps Father   . Diabetes Brother   . Colon polyps Sister    Social History:   reports that she quit smoking about 12 months ago. Her smoking use included cigarettes. She has never used smokeless tobacco. She reports that she does not drink alcohol or use  drugs.  Medications  Current Facility-Administered Medications:  .  sodium chloride flush (NS) 0.9 % injection 3 mL, 3 mL, Intravenous, Once, Veryl Speak, MD  Current Outpatient Medications:  .  albuterol (VENTOLIN HFA) 108 (90 Base) MCG/ACT inhaler, Inhale 2 puffs into the lungs every 4 (four) hours as needed for wheezing or shortness of breath., Disp: 54 g, Rfl: 3 .  Continuous Blood Gluc Sensor (FREESTYLE LIBRE 14 DAY SENSOR) MISC, CHANGE EVERY 14 DAYS (Patient taking differently: Inject 1 patch into the skin every 14 (fourteen) days. ), Disp: 2 each, Rfl: 3 .  aspirin EC 81 MG EC tablet, Take 1 tablet (81 mg total) by mouth daily for 20 days. (Patient not taking: Reported on 09/07/2019), Disp: 20 tablet, Rfl: 0 .  atorvastatin (LIPITOR) 20 MG tablet, Take 1 tablet (20 mg total) by mouth daily., Disp: 90 tablet, Rfl: 3 .  bimatoprost (LUMIGAN) 0.03 % ophthalmic solution, Place 1 drop into both eyes at bedtime., Disp: , Rfl:  .  brinzolamide (AZOPT) 1 % ophthalmic suspension, 1 drop 2 (two) times daily., Disp: , Rfl:  .  clopidogrel (PLAVIX) 75 MG tablet, Take 1 tablet (75 mg total) by mouth daily. (Patient not taking: Reported on 09/07/2019), Disp: 30 tablet, Rfl: 1 .  dexlansoprazole (DEXILANT) 60 MG capsule, Take 1 capsule (60 mg total) by mouth daily., Disp: 90 capsule, Rfl: 3 .  diazepam (VALIUM) 5 MG tablet, Take 1 tablet (5 mg total) by mouth every 12 (twelve) hours as needed for anxiety (insomnia)., Disp: 60 tablet, Rfl: 1 .  diltiazem (CARDIZEM LA) 180 MG 24 hr tablet, Take 1 tablet (180 mg total) by mouth daily., Disp: 30 tablet, Rfl: 1 .  ergocalciferol (VITAMIN D2) 1.25 MG (50000 UT) capsule, Take 1 capsule (50,000 Units total) by mouth once a week. (Patient taking differently: Take 50,000 Units by mouth every Tuesday. ), Disp: 24 capsule, Rfl: 0 .  escitalopram (LEXAPRO) 10 MG tablet, Take 1 tablet (10 mg total) by mouth daily., Disp: 90 tablet, Rfl: 3 .   Fluticasone-Umeclidin-Vilant (TRELEGY ELLIPTA) 100-62.5-25 MCG/INH AEPB, Inhale 1 Inhaler into the lungs daily. This replaces stiolto, Disp: 1 each, Rfl: 3 .  furosemide (LASIX) 40 MG tablet, Take 1 tablet (40 mg total) by mouth daily., Disp: 30 tablet, Rfl: 3 .  HYDROcodone-acetaminophen (NORCO) 10-325 MG per tablet, Take 1 tablet by mouth every 6 (six) hours as needed for moderate pain. , Disp: , Rfl:  .  ipratropium-albuterol (DUONEB) 0.5-2.5 (3) MG/3ML SOLN, INHALE 1 VIAL VIA NEBULIZER EVERY 6 HOURS AS NEEDED (Patient taking differently: Take 3 mLs by nebulization every 6 (six) hours as needed (for breathing). ), Disp: 360 mL, Rfl: 0 .  losartan (COZAAR) 100 MG tablet, Take 1 tablet (100 mg total) by mouth daily., Disp: 90 tablet, Rfl: 3 .  metFORMIN (GLUCOPHAGE) 1000 MG tablet, Take 1 tablet (1,000 mg total) by mouth 2 (two) times daily with a meal., Disp: 60 tablet, Rfl: 1 .  OXYGEN, Inhale 2 L into the  lungs continuous., Disp: , Rfl:  .  rivaroxaban (XARELTO) 20 MG TABS tablet, Take 1 tablet (20 mg total) by mouth daily with supper. (Patient not taking: Reported on 08/29/2019), Disp: 30 tablet, Rfl: 2 .  Rivaroxaban 15 & 20 MG TBPK, Follow package directions: Take one 15mg  tablet by mouth twice a day. On day 22, switch to one 20mg  tablet once a day. Take with food., Disp: 51 each, Rfl: 0 .  rOPINIRole (REQUIP) 2 MG tablet, Take 1 tablet (2 mg total) by mouth 3 (three) times daily. (Patient taking differently: Take 2 mg by mouth at bedtime. ), Disp: 270 tablet, Rfl: 3 .  Tiotropium Bromide-Olodaterol (STIOLTO RESPIMAT) 2.5-2.5 MCG/ACT AERS, Inhale into the lungs., Disp: , Rfl:  .  Tiotropium Bromide-Olodaterol 2.5-2.5 MCG/ACT AERS, Inhale 2 puffs into the lungs daily., Disp: 12 g, Rfl: 3 .  XARELTO 15 MG TABS tablet, See admin instructions., Disp: , Rfl:   ROS:   General ROS: negative for - chills, fatigue, fever, night sweats, weight gain or weight loss Psychological ROS: negative for -  behavioral disorder, hallucinations, memory difficulties, mood swings or suicidal ideation Ophthalmic ROS: negative for - blurry vision, double vision, eye pain or loss of vision ENT ROS: negative for - epistaxis, nasal discharge, oral lesions, sore throat, tinnitus or vertigo Endocrine ROS: negative for - galactorrhea, hair pattern changes, polydipsia/polyuria or temperature intolerance Respiratory ROS: negative for - cough, hemoptysis, shortness of breath or wheezing Cardiovascular ROS: negative for - chest pain, dyspnea on exertion, edema or irregular heartbeat Gastrointestinal ROS: negative for - abdominal pain, diarrhea, hematemesis, nausea/vomiting or stool incontinence Genito-Urinary ROS: negative for - dysuria, hematuria, incontinence or urinary frequency/urgency Musculoskeletal ROS: Positive for -left shoulder pain, left leg weakness, left leg decreased sensation Neurological ROS: as noted in HPI Dermatological ROS: negative for rash and skin lesion changes  Exam: Current vital signs: BP (!) 143/50 (BP Location: Left Arm)   Pulse 72   Resp 15   SpO2 93%  Vital signs in last 24 hours: Pulse Rate:  [72] 72 (01/27 1626) Resp:  [15] 15 (01/27 1626) BP: (143)/(50) 143/50 (01/27 1626) SpO2:  [93 %] 93 % (01/27 1626) Constitutional: Appears well-developed and well-nourished.  Psych: Affect appropriate to situation Eyes: No scleral injection HENT: No OP obstrucion Head: Normocephalic.  Cardiovascular: Normal rate and regular rhythm.  Respiratory: Effort normal, non-labored breathing GI: Soft.  No distension. There is no tenderness.  Skin: WDI  Neuro: Mental Status: Patient is awake, alert, oriented to person, place, month, year, and situation. Speech-normal naming, repeating, comprehension Patient is able to give history. Cranial Nerves: II: Visual Fields are full.  III,IV, VI: EOMI without ptosis or diploplia. Pupils equal, round and reactive to light V: Facial sensation is  symmetric to temperature VII: Facial movement is symmetric.  VIII: hearing is intact to voice X: Palat elevates symmetrically XI: Shoulder shrug is symmetric. XII: tongue is midline without atrophy or fasciculations.  Motor: Tone is normal. Bulk is normal. 5/5 strength throughout with the exception of left leg which is a 4/5 Positive left leg drift Sensory: Creased sensation on left leg otherwise normal Deep Tendon Reflexes: 2+ and symmetric in the biceps and patellae.  Plantars: Toes are downgoing bilaterally.  Cerebellar: FNF  intact bilaterally  Labs I have reviewed labs in epic and the results pertinent to this consultation are:  CBC    Component Value Date/Time   WBC 12.0 (H) 09/07/2019 1623   RBC 4.47 09/07/2019 1623  HGB 13.3 09/07/2019 1641   HCT 39.0 09/07/2019 1641   PLT 340 09/07/2019 1623   MCV 89.0 09/07/2019 1623   MCH 28.6 09/07/2019 1623   MCHC 32.2 09/07/2019 1623   RDW 15.4 09/07/2019 1623   LYMPHSABS 3.3 09/07/2019 1623   MONOABS 0.8 09/07/2019 1623   EOSABS 0.1 09/07/2019 1623   BASOSABS 0.1 09/07/2019 1623    CMP     Component Value Date/Time   NA 138 09/07/2019 1641   K 3.2 (L) 09/07/2019 1641   CL 96 (L) 09/07/2019 1641   CO2 24 09/07/2019 1601   GLUCOSE 142 (H) 09/07/2019 1641   BUN 19 09/07/2019 1641   CREATININE 0.90 09/07/2019 1641   CREATININE 1.08 (H) 07/22/2019 1101   CALCIUM 8.9 09/07/2019 1601   PROT 6.8 08/18/2019 1542   ALBUMIN 3.1 (L) 08/18/2019 1542   AST 24 08/18/2019 1542   ALT 32 08/18/2019 1542   ALKPHOS 52 08/18/2019 1542   BILITOT 0.3 08/18/2019 1542   GFRNONAA 60 (L) 09/07/2019 1601   GFRNONAA 52 (L) 07/22/2019 1101   GFRAA >60 09/07/2019 1601   GFRAA 61 07/22/2019 1101    Lipid Panel     Component Value Date/Time   CHOL 136 08/19/2019 0500   TRIG 70 08/19/2019 0500   HDL 53 08/19/2019 0500   CHOLHDL 2.6 08/19/2019 0500   VLDL 14 08/19/2019 0500   LDLCALC 69 08/19/2019 0500   LDLCALC 98 04/26/2019 0932      Imaging I have reviewed the images obtained:  CT-scan of the brain-  Multifocal subacute watershed infarcts again demonstrated within the bilateral cerebral hemispheres as well as a small subacute right occipital lobe infarct,No new demarcated infarct is identified. No evidence of acute intracranial hemorrhage  MRI from last admission: IMPRESSION: 1. Multifocal acute ischemia within both hemispheres, right worse than left, predominantly in a watershed distribution. No hemorrhage or mass effect. 2. Chronic ischemic microangiopathy   Echocardiogram done on 08/19/2019-60 to 60% LVEF, normal left atrial size, normal right atrial size, no evidence of AV stenosis,.   Etta Quill PA-C [facility] [phone]  M-F  (9:00 am- 5:00 PM)  09/07/2019, 5:14 PM    Attending addendum Patient seen and examined Agree with history and physical above Seen patient as acute code stroke. Independently reviewed imaging CT head with no new changes.  Assessment:  This is a 69 year old female who recently suffered from a bilateral watershed infarct stroke right greater than left 14 days prior.  At that time she had residual left-sided leg weakness and decreased sensation.  Per son patient has had weakness in that leg in fact increased weakness over the last couple days.  Patient recently fell injuring her left shoulder and was at her orthopedic office for further evaluation of her left shoulder.  She was brought to the emergency department secondary to difficulties with left leg at the orthopedics office.  Code stroke was called while in the ED, potentially due to confusion as to which side she was weaker on when she had a stroke in early January.  After discussion with son, it appears that there is no difference in her left leg weakness noted from what he described.  Due to being out of the window and on Xarelto she is not a TPA candidate, in addition with NIH stroke scale of 2 and only  leg weakness she would not be an IR candidate.  Most likely, residual symptoms from previous for expected evolutionary changes of previous stroke-we will  further evaluate with an MRI.    Impression: -Left leg weakness, recent stroke with residual left leg weakness-current presentation likely secondary to evolution of the stroke. -Evaluate for reasons that might cause recrudescence of symptoms such as infections. -Headache-likely tension headache.  Symptoms less likely to be complex migraine.  Recommendations: -MRI brain without contrast  -PT/OT -Urinalysis, chest x-ray  -If the MRI brain without contrast only shows expected evolution and does not show any worsening, and no further inpatient intervention from a neurological standpoint is needed and patient can be discharged home. If the MRI brain is concerning for new process, please call us back and we will provide further set of recommendations  Plan was discussed with Dr. Stark Jock in the ER.  -- Amie Portland, MD [facility] Pager: [phone] If 7pm to 7am, please call on call as listed on AMION.    ADDENDUM MRI brain reviewed personally. No acute change. Expected evolution of strokes seen earlier in this month. No evidence of bleed.  Patient already on anticoagulation for PE.  Updated recommendations: -Follow up on formal radiology read of the MRI (preliminary d/w on-call neurorads and is in consensus with above findings) -Symptoms likely secondary to recrudescence of old symptoms versus expected evolution of the stroke. -No new neurological intervention indicated at this time. -PT OT  Follow-up with outpatient neurology-Dr. Jannifer Franklin as planned.  Please call with questions  Plan relayed to the EDP -- Amie Portland, MD [facility] Pager: [phone] If 7pm to 7am, please call on call as listed on AMION.

## 2019-09-07 NOTE — ED Notes (Signed)
Pt transported to MRI 

## 2019-09-07 NOTE — Telephone Encounter (Signed)
Per chart notes on 08/09/2019: I have also recommended discontinuation of hydrochlorothiazide and replacing this with Lasix 40 mg a day.  Medication D/C'ed.

## 2019-09-07 NOTE — ED Notes (Signed)
Patient verbalizes understanding of discharge instructions. Opportunity for questioning and answers were provided. All questions answered completed. PIV removed, catheter intact. Site dressed with gauze and tape. Armband removed by staff, pt discharged from ED. Wheeled from ED and picked up by son.

## 2019-09-07 NOTE — ED Notes (Signed)
Norco given per [facility]. Name/DOB verified with pt. Pt remains on cart in NAD. Alert, speaking in full sentences. Breathing easy, non-labored. houirly rounds completed

## 2019-09-07 NOTE — ED Notes (Signed)
Pt requesting pain medication for shoulder and restless legs. MD made aware

## 2019-09-07 NOTE — ED Triage Notes (Addendum)
Pt arrives via [facility] to triage from orthopedic office where she drove herself= ems was called due to pt being to weak to stand for knee xray's. Pt recently had stroke with left sided weakness. Pt also has a headache that she reports started this morning.  Pt does have mild drift in left leg that pt states is new since being at [facility] office. Pt also has numbness in that left leg. Hard to assess left arm due to shoulder injury and decrease mobility. Pt taken to [facility] and MD [place] made aware of pt.

## 2019-09-07 NOTE — ED Notes (Signed)
Received call from neurology code stroke canceled.

## 2019-09-07 NOTE — Discharge Instructions (Addendum)
Continue medications as previously prescribed.  Return to the emergency department if your symptoms significantly worsen or change.

## 2019-09-08 ENCOUNTER — Telehealth: Payer: Self-pay | Admitting: Family Medicine

## 2019-09-08 DIAGNOSIS — I2510 Atherosclerotic heart disease of native coronary artery without angina pectoris: Secondary | ICD-10-CM | POA: Diagnosis not present

## 2019-09-08 DIAGNOSIS — E1122 Type 2 diabetes mellitus with diabetic chronic kidney disease: Secondary | ICD-10-CM | POA: Diagnosis not present

## 2019-09-08 DIAGNOSIS — S4292XD Fracture of left shoulder girdle, part unspecified, subsequent encounter for fracture with routine healing: Secondary | ICD-10-CM | POA: Diagnosis not present

## 2019-09-08 DIAGNOSIS — I1310 Hypertensive heart and chronic kidney disease without heart failure, with stage 1 through stage 4 chronic kidney disease, or unspecified chronic kidney disease: Secondary | ICD-10-CM | POA: Diagnosis not present

## 2019-09-08 DIAGNOSIS — N182 Chronic kidney disease, stage 2 (mild): Secondary | ICD-10-CM | POA: Diagnosis not present

## 2019-09-08 DIAGNOSIS — I69354 Hemiplegia and hemiparesis following cerebral infarction affecting left non-dominant side: Secondary | ICD-10-CM | POA: Diagnosis not present

## 2019-09-08 NOTE — Telephone Encounter (Signed)
Called and spoke to pt - she states that the nurse that left recommended she return to the ER. She called [place] to see if that is something she needs to do or not. Pt is not having symptoms different from yesterday. No new symptoms have appeared and she did not want to go to the ER and waste another visit when they could not find anything yesterday.   Scheduled a phone visit with Dr. Dennard Schaumann per pt's request as she may not have transportation to come to the office.

## 2019-09-08 NOTE — Telephone Encounter (Signed)
Pt aware and referral placed.  

## 2019-09-08 NOTE — Telephone Encounter (Signed)
[name] [name] from [facility] left message regarding patient and patient possibly saying she could have had another stroke [phone]

## 2019-09-09 ENCOUNTER — Ambulatory Visit (INDEPENDENT_AMBULATORY_CARE_PROVIDER_SITE_OTHER): Payer: Medicare Other | Admitting: Family Medicine

## 2019-09-09 ENCOUNTER — Other Ambulatory Visit: Payer: Self-pay

## 2019-09-09 DIAGNOSIS — I69354 Hemiplegia and hemiparesis following cerebral infarction affecting left non-dominant side: Secondary | ICD-10-CM | POA: Diagnosis not present

## 2019-09-09 DIAGNOSIS — D6859 Other primary thrombophilia: Secondary | ICD-10-CM | POA: Diagnosis not present

## 2019-09-09 DIAGNOSIS — I63429 Cerebral infarction due to embolism of unspecified anterior cerebral artery: Secondary | ICD-10-CM

## 2019-09-09 DIAGNOSIS — I2510 Atherosclerotic heart disease of native coronary artery without angina pectoris: Secondary | ICD-10-CM | POA: Diagnosis not present

## 2019-09-09 DIAGNOSIS — I2694 Multiple subsegmental pulmonary emboli without acute cor pulmonale: Secondary | ICD-10-CM | POA: Diagnosis not present

## 2019-09-09 DIAGNOSIS — N182 Chronic kidney disease, stage 2 (mild): Secondary | ICD-10-CM | POA: Diagnosis not present

## 2019-09-09 DIAGNOSIS — S4292XD Fracture of left shoulder girdle, part unspecified, subsequent encounter for fracture with routine healing: Secondary | ICD-10-CM | POA: Diagnosis not present

## 2019-09-09 DIAGNOSIS — E1122 Type 2 diabetes mellitus with diabetic chronic kidney disease: Secondary | ICD-10-CM | POA: Diagnosis not present

## 2019-09-09 DIAGNOSIS — I639 Cerebral infarction, unspecified: Secondary | ICD-10-CM | POA: Diagnosis not present

## 2019-09-09 DIAGNOSIS — I1310 Hypertensive heart and chronic kidney disease without heart failure, with stage 1 through stage 4 chronic kidney disease, or unspecified chronic kidney disease: Secondary | ICD-10-CM | POA: Diagnosis not present

## 2019-09-09 MED ORDER — POTASSIUM CHLORIDE CRYS ER 20 MEQ PO TBCR: 20.0000 meq | EXTENDED_RELEASE_TABLET | Freq: Every day | ORAL | 0 refills | Status: DC

## 2019-09-09 NOTE — Progress Notes (Signed)
Subjective:    Patient ID: [name] [name], female    DOB: 1951/04/05, 69 y.o.   MRN: [id number]  HPI  Unfortunately, the patient was recently admitted to the hospital January 7 through January 9.  I have copied relevant portions of the discharge summary below for my reference:  Brief/Interim Summary:  68y.o.F w/history significant ofanxiety, osteoarthritis, CAD,stage IIchronic kidney disease, COPD/chronic hypoxic respiratory failure on 3 LNC at home in sleep,depression,T2DM, diverticulosis, history of duodenitis, GERD, hemorrhoids, hyperlipidemia, hypertension, insomnia, tinnitus, vertigoadmitted with left-sided weakness since 1/7 morning,with left leg dragging not getting better, whodiscussed with her PCP who also sent her to the ER. In the ER,blood work with hyperglycemia 328. Anemia 11.9 g, albumin 3.1, MRI showed "multifocal acute ischemia within both hemispheres, right worse than left, predominantly in the watershed distribution. There was no hemorrhage or mass-effect. There is chronic ischemic angiopathy" Neurology was consulted and admitted. Patient underwent further stroke work-up with echocardiogram carotid ultrasound CT angio neck neurology evaluation PT OT eval So far work-up unremarkable. PT has advised home health PT patient would like to do return home with home health with family support She will continue aspirin and Plavix for 3 months then Plavix alone. Her ldl is Goal.  Discharge Diagnoses:  Principal Problem:   Acute cerebrovascular accident (CVA) ([place]) Active Problems:   Coronary atherosclerosis   Depression   GERD (gastroesophageal reflux disease)   Hypercholesteremia   Hypertension   Type 2 diabetes mellitus ([place])   COPD (chronic obstructive pulmonary disease) ([place])  Acute cerebrovascular accident,MRI showing watershed strokes,likely with generalized hypoperfusion event.has mild rt leg weakness but much better compared to  admission.Appreciate neurology input CT angio head and neck shows a less than 50% stenosis of the proximal right internal carotid artery due to bulky atherosclerotic calcification, no proximal large vessel occlusion. carotid ultrasounds 1 to 39% stenosis bilateral, echocardiogram normal EF no other acute findings, A1c poorly controlled at 9.7, LDL at goal at 69. ptreports takingregular aspirin at home. Per neuro contaspirin 81 and plavix 75 mg for 3 months then Plavix alone, cont Lipitor.Completed stroke work-up PT has advised home health PT. patient will follow up with cardiology for 30-day event monitoring  CAD,EKG- normal sinus rhythm no acute ischemic changes. On aspirin.  Depression: onHome SSRI  GERD:On PPI  Hypercholesteremia:ldl at goal  Hypertension:Blood pressure fairly controlled -Home antihypertensives  To resume on d/c  Type 2 diabetes mellituswith uncontrolled hyperglycemia,poorly controlled A1c 9.7 07/22/2019.  Upon discharge will resume home medication follow-up with PCP for diabetic management  COPD/chronic hypoxic respiratory failure on 3 LNCat home in sleep.  Chronic painpatient reports taking hydrocodone at home and requesting.  Body mass index is 33.3 kg/m.   DVT prophylaxis: lovenox Code Status:full Family Communication:plan of care discussed with patient at bedside. Disposition Plan: Home with home health.    Consultants:neuro Procedures:  CTA head/Neck  1Motion degraded examination, limiting assessment of the intracranial arteries. Within that limitation, there is no proximal large vessel occlusion. 2. 50% stenosis of the proximal right internal carotid artery due to bulky atherosclerotic calcification. 3. Enlarged main pulmonary artery, suggesting pulmonary arterial hypertension. 4. Moderate atherosclerosis of the aortic arch and proximal arch Vessels.  Please see my last office visit, at that time the patient  was endorsing shortness of breath.  Therefore I obtained a CT scan of the lungs to evaluate for possible interstitial lung disease.  However the CT scan returned last week with a coincidental finding of acute bilateral pulmonary emboli!  IMPRESSION: 1. Acute bilateral segmental and subsegmental pulmonary emboli. 2. Stable dilated main pulmonary artery, suggesting chronic pulmonary arterial hypertension. 3. Stable ectatic 4.0 cm ascending thoracic aorta. Recommend annual imaging followup by CTA or MRA. This recommendation follows 2010 ACCF/AHA/AATS/ACR/ASA/SCA/SCAI/SIR/STS/SVM Guidelines for the Diagnosis and Management of Patients with Thoracic Aortic Disease. Circulation. 2010; [id number]. Aortic aneurysm NOS (ICD10-I71.9). 4. Three-vessel coronary atherosclerosis.  08/29/19 Patient was taken off aspirin and Plavix and was transitioned to Xarelto 15 mg twice daily for 21 days with the plan of switching her to 20 mg a day thereafter to complete 3 to 6 months of therapy.  The patient is here today to discuss.  Of note, the patient fractured her arm at the end of November.  This occurred after a fall.  I believe the patient may have developed a DVT after the fracture of her arm and then subsequently developed bilateral pulmonary emboli roughly at the same time she was found to have acute bilateral multifocal ischemia on her MRI.  This raises suspicion about a PFO.  We have already scheduled the patient for a bubble study.  Overall she is doing much better on the Xarelto.  She states that her breathing is improving.  She denies any lingering side effects of the stroke.  Her biggest issue is the pain in her shoulder from the fracture that she sustained.  At that time, my plan was: First, I am awaiting the results of her echocardiogram with bubble study to determine if she has a patent foramen ovale.  If so this is likely the source of her embolic stroke.  I suspect that the patient formed a DVT and  subsequently pulmonary emboli as well as embolic stroke due to the PFO.  The question is whether she requires a hypercoagulable work-up.  Patient likely had a provoked DVT due to her recent shoulder fracture in her left shoulder.  Orthopedics is managing the shoulder fracture.  Patient will complete 6 months of Xarelto for pulmonary embolism.  At that time we will discuss having her see hematology to complete a hypercoagulable work-up but I believe the fracture was likely the precipitating event.  Plan on rechecking the patient in 1 month to see how she is tolerating the Xarelto 20 mg a day and at that time we will discuss further management of her diabetes however the majority of today's office visit was spent explaining how the recent events are correlated.  I also explained the coincidental finding of a thoracic aortic aneurysm and that we need to monitor this annually for any growth but at the present time requires no intervention.   09/09/19 Patient is being seen today as a telephone visit.  Phone call began at 1020.  Phone call concluded at 1038.  Patient consents to be seen via telephone.  She did not want to come to the office.  Patient was recently seen by her orthopedist.  While walking into the orthopedic office to get an x-ray, the patient's left leg gave way on her and she fell.  She reported weakness in her left leg and left arm.  Of note, the left shoulder is the one that she injured recently.  She was therefore taken to the emergency room for new onset left leg weakness and left arm weakness.  Concerned about a possible stroke.  An MRI was obtained in the emergency room which showed the expected evolution of her previous right-sided CVA.  Neurology was consulted and the general consensus was that this  was a lingering consequence of her previous stroke rather than a new insult.  I have copied their consultation below for my reference:    This is a 70 year old female who recently suffered from a  bilateral watershed infarct stroke right greater than left 14 days prior.  At that time she had residual left-sided leg weakness and decreased sensation.  Per son patient has had weakness in that leg in fact increased weakness over the last couple days.  Patient recently fell injuring her left shoulder and was at her orthopedic office for further evaluation of her left shoulder.  She was brought to the emergency department secondary to difficulties with left leg at the orthopedics office.  Code stroke was called while in the ED, potentially due to confusion as to which side she was weaker on when she had a stroke in early January.  After discussion with son, it appears that there is no difference in her left leg weakness noted from what he described.  Due to being out of the window and on Xarelto she is not a TPA candidate, in addition with NIH stroke scale of 2 and only leg weakness she would not be an IR candidate.  Most likely, residual symptoms from previous for expected evolutionary changes of previous stroke-we will further evaluate with an MRI.    Impression: -Left leg weakness, recent stroke with residual left leg weakness-current presentation likely secondary to evolution of the stroke. -Evaluate for reasons that might cause recrudescence of symptoms such as infections. -Headache-likely tension headache.  Symptoms less likely to be complex migraine.  Recommendations: -MRI brain without contrast  -PT/OT -Urinalysis, chest x-ray  -If the MRI brain without contrast only shows expected evolution and does not show any worsening, and no further inpatient intervention from a neurological standpoint is needed and patient can be discharged home. If the MRI brain is concerning for new process, please call us back and we will provide further set of recommendations  Patient was then discharged home.  The only abnormality seen on her lab work was hypokalemia with potassium initially of 2.9.   Follow-up potassium was mildly low at 3.2.  Patient denies taking any potassium repletion.  She is on Lasix for leg swelling however she states that the leg swelling has resolved making me question if she needs to continue the Lasix and if potentially this could be contributing to her hypokalemia.  I do not think the hypokalemia is severe enough to cause muscle weakness however it may contribute.  Also the patient's age and dehydration could potentially contribute as well.  She states that she has noticed considerable change.  She states that she is now having to use a walker to get around her home due to the weakness in her left leg and her left arm.  She states that this was different than when she left the hospital after her first stroke.  Therefore the question today remains is this truly new onset weakness suggesting a new stroke not seen on the MRI versus evolution of her previous stroke versus multifactorial weakness.  Patient denies any back pain or neck pain or numbness or radiating pain going down her legs or arms to suggest disc disease as a contributing cause. Past Medical History:  Diagnosis Date  . Aneurysm, thoracic aortic ([place])   . Anxiety   . Arthritis   . CAD (coronary artery disease)   . Chronic kidney disease    Renal insufficiency  . COPD (chronic obstructive pulmonary disease) (  [place])   . Depression   . Diabetes mellitus   . Diverticulosis of colon (without mention of hemorrhage)   . Duodenitis without mention of hemorrhage   . Embolic stroke ([place])   . GERD (gastroesophageal reflux disease)   . Hemorrhoids   . Hypercholesteremia   . Hypertension   . Insomnia   . Pulmonary embolism ([place])   . Tinnitus   . Vertigo    Past Surgical History:  Procedure Laterality Date  . ANGIOPLASTY    . CHOLECYSTECTOMY    . ROTATOR CUFF REPAIR Right 04/2012  . TUBAL LIGATION     Current Outpatient Medications on File Prior to Visit  Medication Sig Dispense Refill  . albuterol  (VENTOLIN HFA) 108 (90 Base) MCG/ACT inhaler Inhale 2 puffs into the lungs every 4 (four) hours as needed for wheezing or shortness of breath. 54 g 3  . aspirin EC 81 MG EC tablet Take 1 tablet (81 mg total) by mouth daily for 20 days. (Patient not taking: Reported on 09/07/2019) 20 tablet 0  . atorvastatin (LIPITOR) 20 MG tablet Take 1 tablet (20 mg total) by mouth daily. (Patient taking differently: Take 20 mg by mouth at bedtime. ) 90 tablet 3  . bimatoprost (LUMIGAN) 0.01 % SOLN Place 1 drop into both eyes at bedtime.    . brinzolamide (AZOPT) 1 % ophthalmic suspension Place 1 drop into both eyes every 12 (twelve) hours.     . clopidogrel (PLAVIX) 75 MG tablet Take 1 tablet (75 mg total) by mouth daily. (Patient not taking: Reported on 09/07/2019) 30 tablet 1  . Continuous Blood Gluc Sensor (FREESTYLE LIBRE 14 DAY SENSOR) MISC CHANGE EVERY 14 DAYS (Patient taking differently: Inject 1 patch into the skin every 14 (fourteen) days. ) 2 each 3  . dexlansoprazole (DEXILANT) 60 MG capsule Take 1 capsule (60 mg total) by mouth daily. 90 capsule 3  . diazepam (VALIUM) 5 MG tablet Take 1 tablet (5 mg total) by mouth every 12 (twelve) hours as needed for anxiety (insomnia). (Patient taking differently: Take 5 mg by mouth every 12 (twelve) hours as needed (anxiety or insomnia). ) 60 tablet 1  . diltiazem (CARDIZEM) 120 MG tablet Take 120 mg by mouth daily.    . ergocalciferol (VITAMIN D2) 1.25 MG (50000 UT) capsule Take 1 capsule (50,000 Units total) by mouth once a week. (Patient taking differently: Take 50,000 Units by mouth every Tuesday. ) 24 capsule 0  . escitalopram (LEXAPRO) 10 MG tablet Take 1 tablet (10 mg total) by mouth daily. 90 tablet 3  . furosemide (LASIX) 40 MG tablet Take 1 tablet (40 mg total) by mouth daily. 30 tablet 3  . HYDROcodone-acetaminophen (NORCO) 10-325 MG per tablet Take 1 tablet by mouth every 6 (six) hours as needed for moderate pain.     Marland Kitchen ipratropium-albuterol (DUONEB) 0.5-2.5  (3) MG/3ML SOLN INHALE 1 VIAL VIA NEBULIZER EVERY 6 HOURS AS NEEDED (Patient taking differently: Take 3 mLs by nebulization every 6 (six) hours as needed (as directed). ) 360 mL 0  . losartan (COZAAR) 100 MG tablet Take 1 tablet (100 mg total) by mouth daily. 90 tablet 3  . metFORMIN (GLUCOPHAGE) 1000 MG tablet Take 1 tablet (1,000 mg total) by mouth 2 (two) times daily with a meal. 60 tablet 1  . OXYGEN Inhale 2 L into the lungs at bedtime.     . rivaroxaban (XARELTO) 20 MG TABS tablet Take 1 tablet (20 mg total) by mouth daily with supper. [place]  tablet 2  . rOPINIRole (REQUIP) 2 MG tablet Take 1 tablet (2 mg total) by mouth 3 (three) times daily. (Patient taking differently: Take 2 mg by mouth at bedtime. ) 270 tablet 3  . Tiotropium Bromide-Olodaterol 2.5-2.5 MCG/ACT AERS Inhale 2 puffs into the lungs daily. (Patient taking differently: Inhale 1 puff into the lungs daily. ) 12 g 3   No current facility-administered medications on file prior to visit.   Allergies  Allergen Reactions  . Guaifenesin & Derivatives Anaphylaxis    Severe Reaction  . Tape Other (See Comments)    TAPE PEELS OFF THE SKIN!!!!!!!  . Benazepril Cough  . Morphine Nausea Only  . Zyrtec [Cetirizine] Swelling and Other (See Comments)    Hands and feet became swollen  . Latex Rash and Other (See Comments)    No powdered gloves!!   Social History   Socioeconomic History  . Marital status: Widowed    Spouse name: Not on file  . Number of children: 1  . Years of education: Not on file  . Highest education level: Not on file  Occupational History  . Occupation: Retired  Tobacco Use  . Smoking status: Former Smoker    Types: Cigarettes    Quit date: 08/12/2018    Years since quitting: 1.0  . Smokeless tobacco: Never Used  Substance and Sexual Activity  . Alcohol use: No    Alcohol/week: 0.0 standard drinks  . Drug use: No  . Sexual activity: Not on file  Other Topics Concern  . Not on file  Social History  Narrative  . Not on file   Social Determinants of Health   Financial Resource Strain:   . Difficulty of Paying Living Expenses: Not on file  Food Insecurity:   . Worried About Charity fundraiser in the Last Year: Not on file  . Ran Out of Food in the Last Year: Not on file  Transportation Needs: No Transportation Needs  . Lack of Transportation (Medical): No  . Lack of Transportation (Non-Medical): No  Physical Activity:   . Days of Exercise per Week: Not on file  . Minutes of Exercise per Session: Not on file  Stress:   . Feeling of Stress : Not on file  Social Connections:   . Frequency of Communication with Friends and Family: Not on file  . Frequency of Social Gatherings with Friends and Family: Not on file  . Attends Religious Services: Not on file  . Active Member of Clubs or Organizations: Not on file  . Attends Archivist Meetings: Not on file  . Marital Status: Not on file  Intimate Partner Violence:   . Fear of Current or Ex-Partner: Not on file  . Emotionally Abused: Not on file  . Physically Abused: Not on file  . Sexually Abused: Not on file     Review of Systems     Objective:   Physical Exam        Assessment & Plan:  Hypercoagulable state ([place])  Multiple subsegmental pulmonary emboli without acute cor pulmonale ([facility])  Cerebrovascular accident (CVA) due to embolism of anterior cerebral artery, unspecified blood vessel laterality ([place])  Physical exam cannot be performed today as the patient was seen as a telephone visit.  Based on MRI findings, I believe this is weakness to to her original stroke likely complicated by hypokalemia and other factors such as deconditioning.  I will replete the patient's potassium over the weekend with K door 20 mEq p.o.  daily for 5 days and then recheck the patient via telephone on Monday.  She is already on Xarelto for secondary prevention of stroke.  Echocardiogram with bubble study did reveal a small PFO.   Patient has an appointment to see her neurologist, on Friday next week.  Likely require extensive physical therapy to regain her strength and conditioning.  Recommended she discontinue Lasix to avoid worsening of her hypokalemia.

## 2019-09-11 ENCOUNTER — Emergency Department (HOSPITAL_COMMUNITY): Payer: Medicare Other

## 2019-09-11 ENCOUNTER — Inpatient Hospital Stay (HOSPITAL_COMMUNITY)
Admission: EM | Admit: 2019-09-11 | Discharge: 2019-09-14 | DRG: 064 | Disposition: A | Discharge status: Rehabilitation Facility | Payer: Medicare Other | Attending: Internal Medicine | Admitting: Internal Medicine

## 2019-09-11 DIAGNOSIS — G8194 Hemiplegia, unspecified affecting left nondominant side: Secondary | ICD-10-CM | POA: Diagnosis present

## 2019-09-11 DIAGNOSIS — J9611 Chronic respiratory failure with hypoxia: Secondary | ICD-10-CM | POA: Diagnosis present

## 2019-09-11 DIAGNOSIS — R9431 Abnormal electrocardiogram [ECG] [EKG]: Secondary | ICD-10-CM | POA: Diagnosis not present

## 2019-09-11 DIAGNOSIS — I129 Hypertensive chronic kidney disease with stage 1 through stage 4 chronic kidney disease, or unspecified chronic kidney disease: Secondary | ICD-10-CM | POA: Diagnosis present

## 2019-09-11 DIAGNOSIS — I6521 Occlusion and stenosis of right carotid artery: Secondary | ICD-10-CM | POA: Diagnosis not present

## 2019-09-11 DIAGNOSIS — I2510 Atherosclerotic heart disease of native coronary artery without angina pectoris: Secondary | ICD-10-CM | POA: Diagnosis present

## 2019-09-11 DIAGNOSIS — Q2112 Patent foramen ovale: Secondary | ICD-10-CM

## 2019-09-11 DIAGNOSIS — Z8673 Personal history of transient ischemic attack (TIA), and cerebral infarction without residual deficits: Secondary | ICD-10-CM | POA: Diagnosis not present

## 2019-09-11 DIAGNOSIS — I639 Cerebral infarction, unspecified: Secondary | ICD-10-CM | POA: Diagnosis not present

## 2019-09-11 DIAGNOSIS — F419 Anxiety disorder, unspecified: Secondary | ICD-10-CM | POA: Diagnosis present

## 2019-09-11 DIAGNOSIS — Z86718 Personal history of other venous thrombosis and embolism: Secondary | ICD-10-CM | POA: Diagnosis not present

## 2019-09-11 DIAGNOSIS — J449 Chronic obstructive pulmonary disease, unspecified: Secondary | ICD-10-CM | POA: Diagnosis present

## 2019-09-11 DIAGNOSIS — Z833 Family history of diabetes mellitus: Secondary | ICD-10-CM

## 2019-09-11 DIAGNOSIS — I10 Essential (primary) hypertension: Secondary | ICD-10-CM | POA: Diagnosis not present

## 2019-09-11 DIAGNOSIS — Z87891 Personal history of nicotine dependence: Secondary | ICD-10-CM

## 2019-09-11 DIAGNOSIS — R0989 Other specified symptoms and signs involving the circulatory and respiratory systems: Secondary | ICD-10-CM | POA: Diagnosis not present

## 2019-09-11 DIAGNOSIS — Z9981 Dependence on supplemental oxygen: Secondary | ICD-10-CM

## 2019-09-11 DIAGNOSIS — I2609 Other pulmonary embolism with acute cor pulmonale: Secondary | ICD-10-CM | POA: Diagnosis not present

## 2019-09-11 DIAGNOSIS — I6389 Other cerebral infarction: Secondary | ICD-10-CM | POA: Diagnosis not present

## 2019-09-11 DIAGNOSIS — I609 Nontraumatic subarachnoid hemorrhage, unspecified: Secondary | ICD-10-CM | POA: Diagnosis not present

## 2019-09-11 DIAGNOSIS — K219 Gastro-esophageal reflux disease without esophagitis: Secondary | ICD-10-CM | POA: Diagnosis present

## 2019-09-11 DIAGNOSIS — E871 Hypo-osmolality and hyponatremia: Secondary | ICD-10-CM | POA: Diagnosis not present

## 2019-09-11 DIAGNOSIS — E119 Type 2 diabetes mellitus without complications: Secondary | ICD-10-CM | POA: Diagnosis not present

## 2019-09-11 DIAGNOSIS — F329 Major depressive disorder, single episode, unspecified: Secondary | ICD-10-CM | POA: Diagnosis present

## 2019-09-11 DIAGNOSIS — I6300 Cerebral infarction due to thrombosis of unspecified precerebral artery: Secondary | ICD-10-CM | POA: Diagnosis not present

## 2019-09-11 DIAGNOSIS — R569 Unspecified convulsions: Secondary | ICD-10-CM | POA: Diagnosis not present

## 2019-09-11 DIAGNOSIS — Z803 Family history of malignant neoplasm of breast: Secondary | ICD-10-CM | POA: Diagnosis not present

## 2019-09-11 DIAGNOSIS — Z79899 Other long term (current) drug therapy: Secondary | ICD-10-CM

## 2019-09-11 DIAGNOSIS — Z6834 Body mass index (BMI) 34.0-34.9, adult: Secondary | ICD-10-CM | POA: Diagnosis not present

## 2019-09-11 DIAGNOSIS — Z20822 Contact with and (suspected) exposure to covid-19: Secondary | ICD-10-CM | POA: Diagnosis present

## 2019-09-11 DIAGNOSIS — E1165 Type 2 diabetes mellitus with hyperglycemia: Secondary | ICD-10-CM | POA: Diagnosis present

## 2019-09-11 DIAGNOSIS — E785 Hyperlipidemia, unspecified: Secondary | ICD-10-CM | POA: Diagnosis present

## 2019-09-11 DIAGNOSIS — E669 Obesity, unspecified: Secondary | ICD-10-CM | POA: Diagnosis present

## 2019-09-11 DIAGNOSIS — G2581 Restless legs syndrome: Secondary | ICD-10-CM | POA: Diagnosis present

## 2019-09-11 DIAGNOSIS — I2699 Other pulmonary embolism without acute cor pulmonale: Secondary | ICD-10-CM | POA: Diagnosis present

## 2019-09-11 DIAGNOSIS — E876 Hypokalemia: Secondary | ICD-10-CM | POA: Diagnosis not present

## 2019-09-11 DIAGNOSIS — Q211 Atrial septal defect: Secondary | ICD-10-CM | POA: Diagnosis not present

## 2019-09-11 DIAGNOSIS — S4290XD Fracture of unspecified shoulder girdle, part unspecified, subsequent encounter for fracture with routine healing: Secondary | ICD-10-CM

## 2019-09-11 DIAGNOSIS — Z86711 Personal history of pulmonary embolism: Secondary | ICD-10-CM | POA: Diagnosis not present

## 2019-09-11 DIAGNOSIS — E1122 Type 2 diabetes mellitus with diabetic chronic kidney disease: Secondary | ICD-10-CM | POA: Diagnosis present

## 2019-09-11 DIAGNOSIS — G4700 Insomnia, unspecified: Secondary | ICD-10-CM | POA: Diagnosis present

## 2019-09-11 DIAGNOSIS — Z7984 Long term (current) use of oral hypoglycemic drugs: Secondary | ICD-10-CM

## 2019-09-11 DIAGNOSIS — R404 Transient alteration of awareness: Secondary | ICD-10-CM | POA: Diagnosis not present

## 2019-09-11 DIAGNOSIS — M1990 Unspecified osteoarthritis, unspecified site: Secondary | ICD-10-CM | POA: Diagnosis present

## 2019-09-11 DIAGNOSIS — Z7901 Long term (current) use of anticoagulants: Secondary | ICD-10-CM

## 2019-09-11 DIAGNOSIS — I6310 Cerebral infarction due to embolism of unspecified precerebral artery: Secondary | ICD-10-CM

## 2019-09-11 DIAGNOSIS — I6340 Cerebral infarction due to embolism of unspecified cerebral artery: Principal | ICD-10-CM | POA: Diagnosis present

## 2019-09-11 DIAGNOSIS — E7800 Pure hypercholesterolemia, unspecified: Secondary | ICD-10-CM | POA: Diagnosis present

## 2019-09-11 DIAGNOSIS — I959 Hypotension, unspecified: Secondary | ICD-10-CM | POA: Diagnosis not present

## 2019-09-11 DIAGNOSIS — X58XXXD Exposure to other specified factors, subsequent encounter: Secondary | ICD-10-CM | POA: Diagnosis present

## 2019-09-11 DIAGNOSIS — G8111 Spastic hemiplegia affecting right dominant side: Secondary | ICD-10-CM | POA: Diagnosis not present

## 2019-09-11 DIAGNOSIS — N182 Chronic kidney disease, stage 2 (mild): Secondary | ICD-10-CM | POA: Diagnosis present

## 2019-09-11 DIAGNOSIS — R29704 NIHSS score 4: Secondary | ICD-10-CM | POA: Diagnosis present

## 2019-09-11 DIAGNOSIS — R0902 Hypoxemia: Secondary | ICD-10-CM | POA: Diagnosis not present

## 2019-09-11 DIAGNOSIS — Z8249 Family history of ischemic heart disease and other diseases of the circulatory system: Secondary | ICD-10-CM

## 2019-09-11 DIAGNOSIS — R531 Weakness: Secondary | ICD-10-CM | POA: Diagnosis not present

## 2019-09-11 DIAGNOSIS — I82409 Acute embolism and thrombosis of unspecified deep veins of unspecified lower extremity: Secondary | ICD-10-CM

## 2019-09-11 DIAGNOSIS — R29898 Other symptoms and signs involving the musculoskeletal system: Secondary | ICD-10-CM

## 2019-09-11 DIAGNOSIS — I517 Cardiomegaly: Secondary | ICD-10-CM | POA: Diagnosis present

## 2019-09-11 DIAGNOSIS — M6281 Muscle weakness (generalized): Secondary | ICD-10-CM | POA: Diagnosis not present

## 2019-09-11 DIAGNOSIS — R414 Neurologic neglect syndrome: Secondary | ICD-10-CM | POA: Diagnosis not present

## 2019-09-11 DIAGNOSIS — I712 Thoracic aortic aneurysm, without rupture: Secondary | ICD-10-CM | POA: Diagnosis present

## 2019-09-11 DIAGNOSIS — R9401 Abnormal electroencephalogram [EEG]: Secondary | ICD-10-CM | POA: Diagnosis not present

## 2019-09-11 DIAGNOSIS — I69354 Hemiplegia and hemiparesis following cerebral infarction affecting left non-dominant side: Secondary | ICD-10-CM | POA: Diagnosis present

## 2019-09-11 DIAGNOSIS — E1169 Type 2 diabetes mellitus with other specified complication: Secondary | ICD-10-CM | POA: Diagnosis not present

## 2019-09-11 DIAGNOSIS — I82451 Acute embolism and thrombosis of right peroneal vein: Secondary | ICD-10-CM | POA: Diagnosis not present

## 2019-09-11 DIAGNOSIS — I351 Nonrheumatic aortic (valve) insufficiency: Secondary | ICD-10-CM | POA: Diagnosis not present

## 2019-09-11 LAB — URINALYSIS, ROUTINE W REFLEX MICROSCOPIC
Bacteria, UA: NONE SEEN
Bilirubin Urine: NEGATIVE
Glucose, UA: >=500 mg/dL — AB
Hgb urine dipstick: NEGATIVE
Ketones, ur: NEGATIVE mg/dL
Leukocytes,Ua: NEGATIVE
Nitrite: NEGATIVE
Protein, ur: NEGATIVE mg/dL
Specific Gravity, Urine: 1.015 (ref 1.005–1.030)
pH: 6 (ref 5.0–8.0)

## 2019-09-11 LAB — CBC WITH DIFFERENTIAL/PLATELET
Abs Immature Granulocytes: 0.06 10*3/uL (ref 0.00–0.07)
Basophils Absolute: 0.1 10*3/uL (ref 0.0–0.1)
Basophils Relative: 1 %
Eosinophils Absolute: 0.2 10*3/uL (ref 0.0–0.5)
Eosinophils Relative: 2 %
HCT: 38.2 % (ref 36.0–46.0)
Hemoglobin: 12.3 g/dL (ref 12.0–15.0)
Immature Granulocytes: 1 %
Lymphocytes Relative: 36 %
Lymphs Abs: 3.6 10*3/uL (ref 0.7–4.0)
MCH: 28.5 pg (ref 26.0–34.0)
MCHC: 32.2 g/dL (ref 30.0–36.0)
MCV: 88.6 fL (ref 80.0–100.0)
Monocytes Absolute: 0.9 10*3/uL (ref 0.1–1.0)
Monocytes Relative: 9 %
Neutro Abs: 5.2 10*3/uL (ref 1.7–7.7)
Neutrophils Relative %: 51 %
Platelets: 315 10*3/uL (ref 150–400)
RBC: 4.31 MIL/uL (ref 3.87–5.11)
RDW: 15.3 % (ref 11.5–15.5)
WBC: 9.9 10*3/uL (ref 4.0–10.5)
nRBC: 0 % (ref 0.0–0.2)

## 2019-09-11 LAB — COMPREHENSIVE METABOLIC PANEL
ALT: 30 U/L (ref 0–44)
AST: 28 U/L (ref 15–41)
Albumin: 3.3 g/dL — ABNORMAL LOW (ref 3.5–5.0)
Alkaline Phosphatase: 49 U/L (ref 38–126)
Anion gap: 10 (ref 5–15)
BUN: 12 mg/dL (ref 8–23)
CO2: 25 mmol/L (ref 22–32)
Calcium: 9.4 mg/dL (ref 8.9–10.3)
Chloride: 106 mmol/L (ref 98–111)
Creatinine, Ser: 0.79 mg/dL (ref 0.44–1.00)
GFR calc Af Amer: >60 mL/min (ref >60)
GFR calc non Af Amer: >60 mL/min (ref >60)
Glucose, Bld: 124 mg/dL — ABNORMAL HIGH (ref 70–99)
Potassium: 3.8 mmol/L (ref 3.5–5.1)
Sodium: 141 mmol/L (ref 135–145)
Total Bilirubin: 0.2 mg/dL — ABNORMAL LOW (ref 0.3–1.2)
Total Protein: 6.7 g/dL (ref 6.5–8.1)

## 2019-09-11 LAB — CBG MONITORING, ED: Glucose-Capillary: 138 mg/dL — ABNORMAL HIGH (ref 70–99)

## 2019-09-11 LAB — MAGNESIUM: Magnesium: 1.6 mg/dL — ABNORMAL LOW (ref 1.7–2.4)

## 2019-09-11 LAB — ANTITHROMBIN III: AntiThromb III Func: 101 % (ref 75–120)

## 2019-09-11 MED ORDER — LORAZEPAM 1 MG PO TABS
0.5000 mg | ORAL_TABLET | Freq: Once | ORAL | Status: AC
  Administered 2019-09-11: 0.5 mg via ORAL
  Filled 2019-09-11: qty 1

## 2019-09-11 MED ORDER — ACETAMINOPHEN 650 MG RE SUPP: 650.0000 mg | RECTAL | Status: DC | PRN

## 2019-09-11 MED ORDER — ACETAMINOPHEN 160 MG/5ML PO SOLN: 650.0000 mg | ORAL | Status: DC | PRN

## 2019-09-11 MED ORDER — ACETAMINOPHEN 500 MG PO TABS
1000.0000 mg | ORAL_TABLET | Freq: Once | ORAL | Status: AC
  Administered 2019-09-11: 1000 mg via ORAL
  Filled 2019-09-11: qty 2

## 2019-09-11 MED ORDER — ROPINIROLE HCL 1 MG PO TABS
2.0000 mg | ORAL_TABLET | ORAL | Status: AC
  Administered 2019-09-11: 2 mg via ORAL
  Filled 2019-09-11: qty 2

## 2019-09-11 MED ORDER — INSULIN ASPART 100 UNIT/ML ~~LOC~~ SOLN
0.0000 [IU] | Freq: Every day | SUBCUTANEOUS | Status: DC
  Administered 2019-09-12: 22:00:00 2 [IU] via SUBCUTANEOUS

## 2019-09-11 MED ORDER — ATORVASTATIN CALCIUM 10 MG PO TABS
20.0000 mg | ORAL_TABLET | Freq: Every day | ORAL | Status: DC
  Administered 2019-09-12 – 2019-09-13 (×3): 20 mg via ORAL
  Filled 2019-09-11 (×3): qty 2

## 2019-09-11 MED ORDER — ACETAMINOPHEN 325 MG PO TABS
650.0000 mg | ORAL_TABLET | ORAL | Status: DC | PRN
  Administered 2019-09-12 – 2019-09-14 (×4): 650 mg via ORAL
  Filled 2019-09-11 (×3): qty 2

## 2019-09-11 MED ORDER — SODIUM CHLORIDE 0.9% FLUSH
3.0000 mL | Freq: Once | INTRAVENOUS | Status: AC
  Administered 2019-09-11: 3 mL via INTRAVENOUS

## 2019-09-11 MED ORDER — SENNOSIDES-DOCUSATE SODIUM 8.6-50 MG PO TABS
1.0000 | ORAL_TABLET | Freq: Every evening | ORAL | Status: DC | PRN
  Administered 2019-09-12: 22:00:00 1 via ORAL
  Filled 2019-09-11: qty 1

## 2019-09-11 MED ORDER — IPRATROPIUM-ALBUTEROL 0.5-2.5 (3) MG/3ML IN SOLN: 3.0000 mL | Freq: Four times a day (QID) | RESPIRATORY_TRACT | Status: DC | PRN

## 2019-09-11 MED ORDER — STROKE: EARLY STAGES OF RECOVERY BOOK: Freq: Once | Status: DC

## 2019-09-11 MED ORDER — INSULIN ASPART 100 UNIT/ML ~~LOC~~ SOLN
0.0000 [IU] | Freq: Three times a day (TID) | SUBCUTANEOUS | Status: DC
  Administered 2019-09-12: 17:00:00 2 [IU] via SUBCUTANEOUS
  Administered 2019-09-12: 12:00:00 1 [IU] via SUBCUTANEOUS
  Administered 2019-09-12 – 2019-09-13 (×3): 2 [IU] via SUBCUTANEOUS
  Administered 2019-09-13: 17:00:00 5 [IU] via SUBCUTANEOUS
  Administered 2019-09-14: 08:00:00 1 [IU] via SUBCUTANEOUS
  Administered 2019-09-14: 16:00:00 2 [IU] via SUBCUTANEOUS

## 2019-09-11 MED ORDER — MAGNESIUM CHLORIDE 64 MG PO TBEC
1.0000 | DELAYED_RELEASE_TABLET | ORAL | Status: AC
  Administered 2019-09-11: 64 mg via ORAL
  Filled 2019-09-11: qty 1

## 2019-09-11 NOTE — Consult Note (Signed)
Requesting Physician: Dr. Marlowe Sax    Chief Complaint: Worsening left leg weakness  History obtained from: Patient and Chart    HPI:                                                                                                                                       [name] [name] is a 69 y.o. female with past medical history significant for hypertension, hyperlipidemia, diabetes mellitus, coronary artery disease with a recent diagnosis of bilateral strokes in the watershed distribution on 08/18/2019, pulmonary embolism on Xarelto on 1/13, PFO noted on TCD presents to the emergency department with worsening left-sided weakness.  According to the patient, around 3 PM she developed sudden onset left leg weakness which is worse than her prior weakness as well as worsening left upper extremity weakness.  She also has a shoulder fracture and is on current pain medications.  Blood pressure in the ED would vary significantly between the 2 arms, and the right arm was around 0000000 systolic, left arm was [id number] systolic.  Patient brought by EMS after she complained of worsening left-sided weakness than before.  Presented with similar symptoms just 3 days ago and MRI brain performed at that time showed evolution of old strokes with no new infarcts.  Repeat MRI brain today in the emergency department revealed new infarcts in the same watershed distribution as well as petechial hemorrhagic conversion and small subarachnoid hemorrhage.  Neurologist consulted for further recommendations.  Patient also complains of paresthesias in the bilateral plantar surfaces of feet.  tPA Given: No, patient on anticoagulation NIHSS: 4 Baseline MRS 1  Past Medical History:  Diagnosis Date  . Aneurysm, thoracic aortic ([place])   . Anxiety   . Arthritis   . CAD (coronary artery disease)   . Chronic kidney disease    Renal insufficiency  . COPD (chronic obstructive pulmonary disease) ([place])   . Depression   . Diabetes mellitus    . Diverticulosis of colon (without mention of hemorrhage)   . Duodenitis without mention of hemorrhage   . Embolic stroke ([place])   . GERD (gastroesophageal reflux disease)   . Hemorrhoids   . Hypercholesteremia   . Hypertension   . Insomnia   . Pulmonary embolism ([place])   . Tinnitus   . Vertigo     Past Surgical History:  Procedure Laterality Date  . ANGIOPLASTY    . CHOLECYSTECTOMY    . ROTATOR CUFF REPAIR Right 04/2012  . TUBAL LIGATION      Family History  Problem Relation Age of Onset  . Diabetes Mother   . Hemophilia Mother   . Breast cancer Sister   . Heart disease Father   . Colon polyps Father   . Diabetes Brother   . Colon polyps Sister    Social History:  reports that she quit smoking about 12 months ago. Her smoking use included cigarettes. She has  never used smokeless tobacco. She reports that she does not drink alcohol or use drugs.  Allergies:  Allergies  Allergen Reactions  . Guaifenesin & Derivatives Anaphylaxis    Severe Reaction  . Tape Other (See Comments)    TAPE PEELS OFF THE SKIN!!!!!!!  . Benazepril Cough  . Morphine Nausea Only  . Zyrtec [Cetirizine] Swelling and Other (See Comments)    Hands and feet became swollen  . Latex Rash and Other (See Comments)    No powdered gloves!!    Medications:                                                                                                                        I reviewed home medications, dual antiplatelet stop the patient on Xarelto   ROS:                                                                                                                                     14 systems reviewed and negative except above    Examination:                                                                                                      General: Appears well-developed . Psych: Affect appropriate to situation Eyes: No scleral injection HENT: No OP obstrucion Head: Normocephalic.   Cardiovascular: Normal rate and regular rhythm.  Respiratory: Effort normal and breath sounds normal to anterior ascultation GI: Soft.  No distension. There is no tenderness.  Skin: WDI    Neurological Examination Mental Status: Alert, oriented, thought content appropriate.  Speech fluent without evidence of aphasia. Able to follow 3 step commands without difficulty. Cranial Nerves: II: Visual fields grossly normal,  III,IV, VI: ptosis not present, extra-ocular motions intact bilaterally, pupils equal, round, reactive to light and accommodation V,VII: smile symmetric, facial light touch sensation normal bilaterally VIII: hearing normal bilaterally IX,X: uvula rises symmetrically XI: bilateral shoulder shrug XII: midline tongue extension Motor: Right : Upper extremity  5/5    Left:     Upper extremity   4+/5  Lower extremity   5/5     Lower extremity   3/5 Tone and bulk:normal tone throughout; no atrophy noted Sensory: Pinprick and light touch intact throughout, bilaterally Deep Tendon Reflexes: 2+ and symmetric throughout Plantars: Right: downgoing   Left: downgoing Cerebellar: normal finger-to-nose, normal rapid alternating movements and normal heel-to-shin test Gait: normal gait and station     Lab Results: Basic Metabolic Panel: Recent Labs  Lab 09/07/19 1601 09/07/19 1623 09/07/19 1641 09/11/19 1556  NA 136 137 138 141  K 2.9* 3.3* 3.2* 3.8  CL 99 99 96* 106  CO2 24 25  --  25  GLUCOSE 151* 142* 142* 124*  BUN 18 17 19 12   CREATININE 0.96 0.95 0.90 0.79  CALCIUM 8.9 9.1  --  9.4  MG  --   --   --  1.6*    CBC: Recent Labs  Lab 09/07/19 1601 09/07/19 1623 09/07/19 1641 09/11/19 1556  WBC 11.7* 12.0*  --  9.9  NEUTROABS  --  7.7  --  5.2  HGB 12.7 12.8 13.3 12.3  HCT 40.5 39.8 39.0 38.2  MCV 90.0 89.0  --  88.6  PLT 383 340  --  315    Coagulation Studies: No results for input(s): LABPROT, INR in the last 72 hours.  Imaging: MR BRAIN WO  CONTRAST  Result Date: 09/11/2019 CLINICAL DATA:  Initial evaluation for acute difficulty walking, stroke suspected. EXAM: MRI HEAD WITHOUT CONTRAST TECHNIQUE: Multiplanar, multiecho pulse sequences of the brain and surrounding structures were obtained without intravenous contrast. COMPARISON:  Comparison made with multiple recent brain MRIs, most recent of which from 09/07/2019. FINDINGS: Brain: Generalized age-related cerebral atrophy with chronic microvascular ischemic disease again noted. Multiple remote lacunar infarcts present within the hemispheric cerebral white matter. Continued interval evolution of previously seen subacute watershed type infarcts involving the bilateral cerebral hemispheres. However, multiple new watershed type infarcts are now seen on today's exam, increased in size and number but with similar distribution as compared to previous. Overall, involvement is slightly worse within the right cerebral hemisphere as compared to the left. New patchy involvement of the bilateral parieto-occipital regions as well, also greater on the right (series 5, image 66). Now seen are a few scattered foci of susceptibility artifact associated with these infarcts, consistent with associated petechial hemorrhage (series 14, image 46). Additionally, there is a serpiginous focus of FLAIR and susceptibility artifact seen layering along a cortical sulcus within the right frontal lobe, concerning for a small volume subarachnoid hemorrhage, new from previous (series 14, image 46). No frank intraparenchymal hematoma. No significant mass effect. No mass lesion or midline shift. No hydrocephalus. No extra-axial fluid collection. Pituitary gland suprasellar region normal. Midline structures intact. Vascular: Major intracranial vascular flow voids are maintained. Skull and upper cervical spine: Craniocervical junction normal. Upper cervical spine within normal limits. Bone marrow signal intensity normal. No scalp soft  tissue abnormality. Sinuses/Orbits: Globes and orbital soft tissues within normal limits. Paranasal sinuses remain largely clear. No significant mastoid effusion. Other: None. IMPRESSION: 1. Continued interval evolution of previously seen subacute infarcts, with multiple new scattered acute ischemic watershed type infarcts involving the bilateral cerebral hemispheres, right greater than left. Associated petechial hemorrhage about a few of these infarcts without frank hemorrhagic transformation. 2. Small volume subarachnoid hemorrhage layering along the cortical sulcus within the right frontal lobe, new from previous. 3. Otherwise stable exam with underlying cerebral  atrophy, chronic microvascular ischemic disease, and multiple remote lacunar infarcts involving the hemispheric cerebral white matter. Critical Value/emergent results were called by telephone at the time of interpretation on 09/11/2019 at 8:43 pm to provider [facility] , who verbally acknowledged these results. Electronically Signed   By: Jeannine Boga M.D.   On: 09/11/2019 20:43     ASSESSMENT AND PLAN   #Acute right parietal occipital lobe infarcts #Subacute bilateral watershed territory infarcts #Small volume subarachnoid hemorrhage in the right frontal lobe petechial hemorrhages area of prior infarctions #Pulmonary embolism and lower leg DVT on Xarelto   Recommendations -Hypercoagulable work-up -Consider switching from Xarelto to Eliquis or Coumadin for failure -Monitor blood pressure for fluctuations, although patient does not have critical stenosis if she has significant blood pressure variations this could cause watershed stroke -PT OT -continue statin   Stroke team to follow  [place] Pager Number [phone]

## 2019-09-11 NOTE — ED Triage Notes (Signed)
Pt to ED via EMS from home c/o left sided weakness. Hx stroke 3 weeks ago that resulted in left sided weakness, reports feels more weak now. #20 L forearm,  500 ML NS bolus given by EMS.

## 2019-09-11 NOTE — H&P (Addendum)
History and Physical    [name] [name] [id number] DOB: 08/22/1950 DOA: 09/11/2019  PCP: Susy Frizzle, MD Patient coming from: Home  Chief Complaint: Left leg weakness  HPI: [name] [name] is a 69 y.o. female with medical history significant of CAD, CKD stage II, COPD/chronic hypoxemic respiratory failure on 3 L home oxygen at night, type 2 diabetes, hypertension, hyperlipidemia, thoracic aortic aneurysm, and conditions listed below presenting to the ED for evaluation of left leg weakness.  Patient was recently admitted 1/7-1/9 for acute CVA with MRI showing watershed infarcts in bilateral hemispheres.  CT angiogram head and neck revealed 50% stenosis of the proximal right internal carotid artery due to bulky atherosclerotic calcification.  Carotid ultrasounds showing 1 to 39% stenosis bilaterally.  Echocardiogram with normal EF and no other acute findings.  A1c 9.4 consistent with poor glycemic control.  LDL 69 at goal.  She was discharged on aspirin and Plavix x3 months, then Plavix alone.  Plan was for patient to follow-up with cardiology for a 30-day event monitor.  She did have mild right leg weakness at the time of discharge.  After discharge, patient was evaluated by her PCP for complaints of shortness of breath.  CT angiogram chest done 1/12 revealed acute bilateral segmental and subsegmental pulmonary emboli.  Lower extremity Dopplers done 1/19 revealed right lower extremity DVT which was felt to be likely provoked in the setting of recent shoulder fracture.  However, there is also concern for possible underlying hypercoagulable state and she was also referred to hematology (hypercoagulable work-up not done yet).  She was started on Xarelto at that time and aspirin plus Plavix were discontinued.  Bubble study done by outpatient neurology on 1/19 revealed a small PFO which was felt likely not large enough to allow for emboli from right heart to left.  Patient was then seen in the ED on  1/27 for complaints of left-sided weakness.  Head CT revealed no acute stroke but did show findings associated with her prior stroke.  Patient was seen by neurology and an MRI was ordered which again did not show an acute stroke and showed findings associated with her prior stroke.  Patient was discharged from the ED.  Patient states for the past 10 days she has had weakness in both of her legs, worse on the left side.  She does have difficulty moving her left upper extremity because she was recently diagnosed with a shoulder fracture for which she is seen by orthopedics and no surgery has been planned.  No facial droop or slurring of speech.  No other complaints.  Denies any sick contacts or recent illness.  No fevers, chills, shortness breath, cough, nausea, vomiting, diarrhea, abdominal pain, dysuria.  No other complaints.  ED Course: Blood pressure soft, remainder of vital signs stable on arrival.  Labs revealed low magnesium level and patient was given magnesium supplementation.  MRI brain done today showing continued interval evolution of previously seen subacute infarcts with multiple new scattered acute ischemic watershed type infarcts involving the bilateral cerebral hemispheres, right greater than left.  Associated petechial hemorrhage about a few of these infarcts without frank hemorrhagic transformation.  Small volume subarachnoid hemorrhage layering along the cortical sulcus within the right frontal lobe, new from previous. Neurology consulted.  Review of Systems:  All systems reviewed and apart from history of presenting illness, are negative.  Past Medical History:  Diagnosis Date  . Aneurysm, thoracic aortic ([place])   . Anxiety   . Arthritis   .  CAD (coronary artery disease)   . Chronic kidney disease    Renal insufficiency  . COPD (chronic obstructive pulmonary disease) ([place])   . Depression   . Diabetes mellitus   . Diverticulosis of colon (without mention of hemorrhage)   .  Duodenitis without mention of hemorrhage   . Embolic stroke ([place])   . GERD (gastroesophageal reflux disease)   . Hemorrhoids   . Hypercholesteremia   . Hypertension   . Insomnia   . Pulmonary embolism ([place])   . Tinnitus   . Vertigo     Past Surgical History:  Procedure Laterality Date  . ANGIOPLASTY    . CHOLECYSTECTOMY    . ROTATOR CUFF REPAIR Right 04/2012  . TUBAL LIGATION       reports that she quit smoking about 12 months ago. Her smoking use included cigarettes. She has never used smokeless tobacco. She reports that she does not drink alcohol or use drugs.  Allergies  Allergen Reactions  . Guaifenesin & Derivatives Anaphylaxis    Severe Reaction  . Tape Other (See Comments)    TAPE PEELS OFF THE SKIN!!!!!!!  . Benazepril Cough  . Morphine Nausea Only  . Zyrtec [Cetirizine] Swelling and Other (See Comments)    Hands and feet became swollen  . Latex Rash and Other (See Comments)    No powdered gloves!!    Family History  Problem Relation Age of Onset  . Diabetes Mother   . Hemophilia Mother   . Breast cancer Sister   . Heart disease Father   . Colon polyps Father   . Diabetes Brother   . Colon polyps Sister     Prior to Admission medications   Medication Sig Start Date End Date Taking? Authorizing Provider  albuterol (VENTOLIN HFA) 108 (90 Base) MCG/ACT inhaler Inhale 2 puffs into the lungs every 4 (four) hours as needed for wheezing or shortness of breath. 06/07/19   Susy Frizzle, MD  atorvastatin (LIPITOR) 20 MG tablet Take 1 tablet (20 mg total) by mouth daily. Patient taking differently: Take 20 mg by mouth at bedtime.  04/29/19   Susy Frizzle, MD  bimatoprost (LUMIGAN) 0.01 % SOLN Place 1 drop into both eyes at bedtime.    [provider]  brinzolamide (AZOPT) 1 % ophthalmic suspension Place 1 drop into both eyes every 12 (twelve) hours.     [provider]  clopidogrel (PLAVIX) 75 MG tablet Take 1 tablet (75 mg total) by mouth  daily. Patient not taking: Reported on 09/07/2019 08/20/19 10/19/19  Antonieta Pert, MD  Continuous Blood Gluc Sensor (FREESTYLE LIBRE 14 DAY SENSOR) MISC CHANGE EVERY 14 DAYS Patient taking differently: Inject 1 patch into the skin every 14 (fourteen) days.  07/26/19   Susy Frizzle, MD  dexlansoprazole (DEXILANT) 60 MG capsule Take 1 capsule (60 mg total) by mouth daily. 04/27/19   Susy Frizzle, MD  diazepam (VALIUM) 5 MG tablet Take 1 tablet (5 mg total) by mouth every 12 (twelve) hours as needed for anxiety (insomnia). Patient taking differently: Take 5 mg by mouth every 12 (twelve) hours as needed (anxiety or insomnia).  06/07/19   Susy Frizzle, MD  diltiazem (CARDIZEM) 120 MG tablet Take 120 mg by mouth daily.    [provider]  ergocalciferol (VITAMIN D2) 1.25 MG (50000 UT) capsule Take 1 capsule (50,000 Units total) by mouth once a week. Patient taking differently: Take 50,000 Units by mouth every Tuesday.  05/24/19   Jenna Luo  T, MD  escitalopram (LEXAPRO) 10 MG tablet Take 1 tablet (10 mg total) by mouth daily. 06/07/19   Susy Frizzle, MD  furosemide (LASIX) 40 MG tablet Take 1 tablet (40 mg total) by mouth daily. 08/09/19   Susy Frizzle, MD  HYDROcodone-acetaminophen (NORCO) 10-325 MG per tablet Take 1 tablet by mouth every 6 (six) hours as needed for moderate pain.     [provider]  ipratropium-albuterol (DUONEB) 0.5-2.5 (3) MG/3ML SOLN INHALE 1 VIAL VIA NEBULIZER EVERY 6 HOURS AS NEEDED Patient taking differently: Take 3 mLs by nebulization every 6 (six) hours as needed (as directed).  10/25/18   Susy Frizzle, MD  losartan (COZAAR) 100 MG tablet Take 1 tablet (100 mg total) by mouth daily. 04/27/19   Susy Frizzle, MD  metFORMIN (GLUCOPHAGE) 1000 MG tablet Take 1 tablet (1,000 mg total) by mouth 2 (two) times daily with a meal. 08/19/19   Susy Frizzle, MD  OXYGEN Inhale 2 L into the lungs at bedtime.     [provider]    potassium chloride SA (KLOR-CON) 20 MEQ tablet Take 1 tablet (20 mEq total) by mouth daily. 09/09/19   Susy Frizzle, MD  rivaroxaban (XARELTO) 20 MG TABS tablet Take 1 tablet (20 mg total) by mouth daily with supper. 08/24/19   [place], Modena Nunnery, MD  rOPINIRole (REQUIP) 2 MG tablet Take 1 tablet (2 mg total) by mouth 3 (three) times daily. Patient taking differently: Take 2 mg by mouth at bedtime.  04/27/19   Susy Frizzle, MD  Tiotropium Bromide-Olodaterol 2.5-2.5 MCG/ACT AERS Inhale 2 puffs into the lungs daily. Patient taking differently: Inhale 1 puff into the lungs daily.  06/07/19   Susy Frizzle, MD    Physical Exam: Vitals:   09/11/19 2115 09/11/19 2130 09/11/19 2145 09/11/19 2235  BP: (!) 128/113 (!) 130/97 (!) 166/62 92/64  Pulse:  73 77 70  Resp: 20 15 14 13   Temp:      TempSrc:      SpO2:  92% 94% 98%  Weight:      Height:        Physical Exam  Constitutional: She is oriented to person, place, and time. She appears well-developed and well-nourished. No distress.  HENT:  Head: Normocephalic.  Eyes: Pupils are equal, round, and reactive to light. EOM are normal.  Cardiovascular: Normal rate, regular rhythm and intact distal pulses.  Pulmonary/Chest: Effort normal and breath sounds normal. No respiratory distress. She has no wheezes. She has no rales.  Abdominal: Soft. Bowel sounds are normal. She exhibits no distension. There is no abdominal tenderness. There is no guarding.  Musculoskeletal:        General: No edema.     Cervical back: Neck supple.  Neurological: She is alert and oriented to person, place, and time. No cranial nerve deficit.  Strength 5 out of 5 in the right upper extremity Left upper extremity examination limited due to pain/discomfort in the setting of recent left shoulder fracture.  No gross motor weakness. Strength 5 out of 5 in the right lower extremity and 3 out of 5 in the left lower extremity. Sensation to light touch intact  throughout.   Skin: Skin is dry. She is not diaphoretic.     Labs on Admission: I have personally reviewed following labs and imaging studies  CBC: Recent Labs  Lab 09/07/19 1601 09/07/19 1623 09/07/19 1641 09/11/19 1556  WBC 11.7* 12.0*  --  9.9  NEUTROABS  --  7.7  --  5.2  HGB 12.7 12.8 13.3 12.3  HCT 40.5 39.8 39.0 38.2  MCV 90.0 89.0  --  88.6  PLT 383 340  --  123456   Basic Metabolic Panel: Recent Labs  Lab 09/07/19 1601 09/07/19 1623 09/07/19 1641 09/11/19 1556  NA 136 137 138 141  K 2.9* 3.3* 3.2* 3.8  CL 99 99 96* 106  CO2 24 25  --  25  GLUCOSE 151* 142* 142* 124*  BUN 18 17 19 12   CREATININE 0.96 0.95 0.90 0.79  CALCIUM 8.9 9.1  --  9.4  MG  --   --   --  1.6*   GFR: Estimated Creatinine Clearance: 70 mL/min (by C-G formula based on SCr of 0.79 mg/dL). Liver Function Tests: Recent Labs  Lab 09/07/19 1623 09/11/19 1556  AST 28 28  ALT 30 30  ALKPHOS 49 49  BILITOT 0.2* 0.2*  PROT 6.9 6.7  ALBUMIN 3.5 3.3*   No results for input(s): LIPASE, AMYLASE in the last 168 hours. No results for input(s): AMMONIA in the last 168 hours. Coagulation Profile: Recent Labs  Lab 09/07/19 1623  INR 1.2   Cardiac Enzymes: No results for input(s): CKTOTAL, CKMB, CKMBINDEX, TROPONINI in the last 168 hours. BNP (last 3 results) No results for input(s): PROBNP in the last 8760 hours. HbA1C: No results for input(s): HGBA1C in the last 72 hours. CBG: Recent Labs  Lab 09/07/19 1631 09/11/19 2226  GLUCAP 147* 138*   Lipid Profile: No results for input(s): CHOL, HDL, LDLCALC, TRIG, CHOLHDL, LDLDIRECT in the last 72 hours. Thyroid Function Tests: No results for input(s): TSH, T4TOTAL, FREET4, T3FREE, THYROIDAB in the last 72 hours. Anemia Panel: No results for input(s): VITAMINB12, FOLATE, FERRITIN, TIBC, IRON, RETICCTPCT in the last 72 hours. Urine analysis:    Component Value Date/Time   COLORURINE STRAW (A) 09/11/2019 1535   APPEARANCEUR CLEAR  09/11/2019 1535   LABSPEC 1.015 09/11/2019 1535   PHURINE 6.0 09/11/2019 1535   GLUCOSEU >=500 (A) 09/11/2019 1535   HGBUR NEGATIVE 09/11/2019 1535   BILIRUBINUR NEGATIVE 09/11/2019 1535   KETONESUR NEGATIVE 09/11/2019 1535   PROTEINUR NEGATIVE 09/11/2019 1535   NITRITE NEGATIVE 09/11/2019 1535   LEUKOCYTESUR NEGATIVE 09/11/2019 1535    Radiological Exams on Admission: MR BRAIN WO CONTRAST  Result Date: 09/11/2019 CLINICAL DATA:  Initial evaluation for acute difficulty walking, stroke suspected. EXAM: MRI HEAD WITHOUT CONTRAST TECHNIQUE: Multiplanar, multiecho pulse sequences of the brain and surrounding structures were obtained without intravenous contrast. COMPARISON:  Comparison made with multiple recent brain MRIs, most recent of which from 09/07/2019. FINDINGS: Brain: Generalized age-related cerebral atrophy with chronic microvascular ischemic disease again noted. Multiple remote lacunar infarcts present within the hemispheric cerebral white matter. Continued interval evolution of previously seen subacute watershed type infarcts involving the bilateral cerebral hemispheres. However, multiple new watershed type infarcts are now seen on today's exam, increased in size and number but with similar distribution as compared to previous. Overall, involvement is slightly worse within the right cerebral hemisphere as compared to the left. New patchy involvement of the bilateral parieto-occipital regions as well, also greater on the right (series 5, image 66). Now seen are a few scattered foci of susceptibility artifact associated with these infarcts, consistent with associated petechial hemorrhage (series 14, image 46). Additionally, there is a serpiginous focus of FLAIR and susceptibility artifact seen layering along a cortical sulcus within the right frontal lobe, concerning for a small volume subarachnoid  hemorrhage, new from previous (series 14, image 46). No frank intraparenchymal hematoma. No  significant mass effect. No mass lesion or midline shift. No hydrocephalus. No extra-axial fluid collection. Pituitary gland suprasellar region normal. Midline structures intact. Vascular: Major intracranial vascular flow voids are maintained. Skull and upper cervical spine: Craniocervical junction normal. Upper cervical spine within normal limits. Bone marrow signal intensity normal. No scalp soft tissue abnormality. Sinuses/Orbits: Globes and orbital soft tissues within normal limits. Paranasal sinuses remain largely clear. No significant mastoid effusion. Other: None. IMPRESSION: 1. Continued interval evolution of previously seen subacute infarcts, with multiple new scattered acute ischemic watershed type infarcts involving the bilateral cerebral hemispheres, right greater than left. Associated petechial hemorrhage about a few of these infarcts without frank hemorrhagic transformation. 2. Small volume subarachnoid hemorrhage layering along the cortical sulcus within the right frontal lobe, new from previous. 3. Otherwise stable exam with underlying cerebral atrophy, chronic microvascular ischemic disease, and multiple remote lacunar infarcts involving the hemispheric cerebral white matter. Critical Value/emergent results were called by telephone at the time of interpretation on 09/11/2019 at 8:43 pm to provider [facility] , who verbally acknowledged these results. Electronically Signed   By: Jeannine Boga M.D.   On: 09/11/2019 20:43    EKG: Independently reviewed.  Sinus rhythm, no significant change since prior tracing.  Assessment/Plan Principal Problem:   Acute CVA (cerebrovascular accident) [facility]) Active Problems:   Type 2 diabetes mellitus ([place])   Pulmonary embolism ([facility])   Subarachnoid hemorrhage ([facility])   DVT (deep venous thrombosis) ([facility])   Acute CVA/watershed infarcts and a small subarachnoid hemorrhage Patient was recently admitted 1/7-1/9 for acute CVA with MRI showing watershed  infarcts in bilateral hemispheres.  CT angiogram head and neck revealed 50% stenosis of the proximal right internal carotid artery due to bulky atherosclerotic calcification.  Carotid ultrasounds showing 1 to 39% stenosis bilaterally.  Echocardiogram with normal EF and no other acute findings.  A1c 9.4 consistent with poor glycemic control.  LDL 69 at goal. She was discharged on aspirin and Plavix x3 months, then Plavix alone.  Plan was for patient to follow-up with cardiology for a 30-day event monitor.  After discharge, patient was diagnosed with acute PE upon evaluation by her PCP.  Aspirin and Plavix were stopped.  She was started on Xarelto for anticoagulation. Bubble study done by outpatient neurology on 1/19 revealed a small PFO which was felt likely not large enough to allow for emboli from right heart to left.   Now presenting with complaints of bilateral leg weakness, worse in the left leg x10 days.  On exam, does have weakness of the left lower extremity.  MRI brain done today showing continued interval evolution of previously seen subacute infarcts with multiple new scattered acute ischemic watershed type infarcts involving the bilateral cerebral hemispheres, right greater than left.  Associated petechial hemorrhage about a few of these infarcts without frank hemorrhagic transformation.  Small volume subarachnoid hemorrhage layering along the cortical sulcus within the right frontal lobe, new from previous. -Telemetry monitoring -Neurology has been consulted, recommendations pending. -Continue statin -Frequent neurochecks -PT, OT, speech therapy. -N.p.o. until cleared by bedside swallow evaluation or formal speech evaluation  PE and DVT CT angiogram chest done 1/12 revealed acute bilateral segmental and subsegmental pulmonary emboli.  Lower extremity Dopplers done 1/19 revealed right lower extremity DVT which was felt to be likely provoked in the setting of recent shoulder fracture.  However,  there is also concern for possible underlying hypercoagulable state and  she was also referred to hematology (hypercoagulable work-up not done yet).  -Patient needs continued anticoagulation, however, brain MRI done today showing evidence of new small volume subarachnoid hemorrhage.  Will discuss with neurology. -Hypercoagulable work-up panel ordered  Addendum: Spoke to Dr. Lorraine Lax from neurology.  He feels the patient's stroke is likely related to Xarelto failure.  He is recommending switching to Eliquis.  He feels the subarachnoid hemorrhage seen on imaging is small volume and it is best to resume anticoagulation with Eliquis given concern for Xarelto failure causing another stroke and recently diagnosed PE. Risk versus benefit of anticoagulation have been discussed with the patient and she agrees to start Eliquis.    CKD stage II -Stable.  Renal function at baseline.  COPD/chronic hypoxemic respiratory failure on 3 L home oxygen at night -Stable.  Continue home oxygen.  DuoNeb as needed.  Hypertension -Blood pressure soft.  Allow permissive hypertension at this time in setting of acute stroke.  Hypomagnesemia Potassium 1.6 and received potassium supplementation in the ED. -Continue to monitor magnesium level  Type 2 diabetes Currently only on Metformin.  Recent A1c 9.4 consistent with poor glycemic control. -Sliding scale insulin sensitive ACHS in the hospital -Consult diabetes coordinator -We will need optimization of therapy and close PCP follow-up  DVT prophylaxis: Avoid SCDs given known DVT.  Will discuss need for anticoagulation with neurology given acute subarachnoid hemorrhage on brain MRI and recently diagnosed PE and DVT. Code Status: Patient wishes to be full code. Family Communication: No family available at this time. Disposition Plan: Anticipate discharge after clinical improvement. Consults called: Neurology (Dr. Lorraine Lax) Admission status: It is my clinical opinion that  admission to INPATIENT is reasonable and necessary in this 69 y.o. female . presenting with acute CVA and a small subarachnoid hemorrhage.  Very high risk of decompensation.  Given the aforementioned, the predictability of an adverse outcome is felt to be significant. I expect that the patient will require at least 2 midnights in the hospital to treat this condition.   The medical decision making on this patient was of high complexity and the patient is at high risk for clinical deterioration, therefore this is a level 3 visit.  Shela Leff MD [facility]  If 7PM-7AM, please contact night-coverage [email or web address] Password [facility]  09/11/2019, 10:57 PM

## 2019-09-11 NOTE — ED Provider Notes (Signed)
[facility] EMERGENCY DEPARTMENT Provider Note   CSN: [id number] Arrival date & time: 09/11/19  1410     History Chief Complaint  Patient presents with  . Weakness    [name] [name] is a 69 y.o. female  w/history significant ofanxiety, osteoarthritis, CAD,stage IIchronic kidney disease, COPD/chronic hypoxic respiratory failure on 3 LNC at home in sleep,depression,T2DM, diverticulosis, history of duodenitis, GERD, hemorrhoids, hyperlipidemia, hypertension, insomnia, tinnitus, vertigo  HPI Patient is a 69 year old female with a history of recent stroke 3 weeks ago which she was admitted for 1/7-1/9.  She presents today with worsening bilateral leg weakness has been resting for the past 3 days.  Discussed with patient she states she has been short of breath for some time as well.  She was treated initially with steroids/albuterol/Levaquin for COPD exacerbation versus pneumonia by PCP.  Patient denies any chest pain currently but states she does feel short of breath as she has for some time.  She feels that she is experiencing worse leg weakness than she had during her last ED visit 4 days ago.  She states she is now unable to walk and has to be assisted to use restroom because of her leg weakness.  She states that she was found to have PEs by her primary care doctor and was started on Xarelto. Echo done at that time with bubble study did reveal small PFO.   On my review of EMR: Patient was hospitalized 1/7-1/9 for acute watershed strokes bilateral hemispheres.  There is no hemorrhage or mass-effect at that time.  Neurology was consulted and admitted.  She had unremarkable carotid artery ultrasound and stroke work-up.  Was discharged with aspirin and Plavix for 3 months and Plavix alone. She did have 50% stenosis of right proximal internal carotid artery due to atherosclerotic calcification on CTA.  Right-sided leg weakness noted at time of discharge.   Patient was  noted to have pulmonary embolisms 1/13 when she had CT chest scan done by her PCP.  Xarelto was started at that time --at that time aspirin and Plavix were discontinued and a bubble study was ordered to look for PFO which found a small PFO present.  Bubble study was ordered by Margette Fast of neurology.      Past Medical History:  Diagnosis Date  . Aneurysm, thoracic aortic ([place])   . Anxiety   . Arthritis   . CAD (coronary artery disease)   . Chronic kidney disease    Renal insufficiency  . COPD (chronic obstructive pulmonary disease) ([place])   . Depression   . Diabetes mellitus   . Diverticulosis of colon (without mention of hemorrhage)   . Duodenitis without mention of hemorrhage   . Embolic stroke ([place])   . GERD (gastroesophageal reflux disease)   . Hemorrhoids   . Hypercholesteremia   . Hypertension   . Insomnia   . Pulmonary embolism ([place])   . Tinnitus   . Vertigo     Patient Active Problem List   Diagnosis Date Noted  . Aneurysm, thoracic aortic ([place])   . Embolic stroke ([place])   . Pulmonary embolism ([place])   . Acute cerebrovascular accident (CVA) ([place]) 08/18/2019  . COPD (chronic obstructive pulmonary disease) ([place])   . COPD exacerbation ([place]) 08/12/2018  . Hyponatremia 08/12/2018  . Acute respiratory failure with hypoxia ([place]) 10/26/2015  . CAP (community acquired pneumonia) 10/26/2015  . Type 2 diabetes mellitus ([place]) 10/26/2015  . Overactive bladder 09/10/2015  . Diabetes mellitus type 2,  controlled, without complications ([place])   . Depression   . Insomnia   . GERD (gastroesophageal reflux disease)   . Hypercholesteremia   . Hypertension   . Chronic kidney disease   . Overweight 10/29/2009  . GERD 06/20/2008  . CHEST PAIN 06/20/2008  . CHOLECYSTECTOMY, HX OF 06/20/2008  . HYPERLIPIDEMIA 12/11/2007  . TOBACCO ABUSE 12/11/2007  . DEPRESSION 12/11/2007  . Essential hypertension 12/11/2007  . Coronary atherosclerosis 12/11/2007  . DUODENITIS, WITH  HEMORRHAGE 12/11/2007    Past Surgical History:  Procedure Laterality Date  . ANGIOPLASTY    . CHOLECYSTECTOMY    . ROTATOR CUFF REPAIR Right 04/2012  . TUBAL LIGATION       OB History   No obstetric history on file.     Family History  Problem Relation Age of Onset  . Diabetes Mother   . Hemophilia Mother   . Breast cancer Sister   . Heart disease Father   . Colon polyps Father   . Diabetes Brother   . Colon polyps Sister     Social History   Tobacco Use  . Smoking status: Former Smoker    Types: Cigarettes    Quit date: 08/12/2018    Years since quitting: 1.0  . Smokeless tobacco: Never Used  Substance Use Topics  . Alcohol use: No    Alcohol/week: 0.0 standard drinks  . Drug use: No    Home Medications Prior to Admission medications   Medication Sig Start Date End Date Taking? Authorizing Provider  albuterol (VENTOLIN HFA) 108 (90 Base) MCG/ACT inhaler Inhale 2 puffs into the lungs every 4 (four) hours as needed for wheezing or shortness of breath. 06/07/19   Susy Frizzle, MD  atorvastatin (LIPITOR) 20 MG tablet Take 1 tablet (20 mg total) by mouth daily. Patient taking differently: Take 20 mg by mouth at bedtime.  04/29/19   Susy Frizzle, MD  bimatoprost (LUMIGAN) 0.01 % SOLN Place 1 drop into both eyes at bedtime.    [provider]  brinzolamide (AZOPT) 1 % ophthalmic suspension Place 1 drop into both eyes every 12 (twelve) hours.     [provider]  clopidogrel (PLAVIX) 75 MG tablet Take 1 tablet (75 mg total) by mouth daily. Patient not taking: Reported on 09/07/2019 08/20/19 10/19/19  Antonieta Pert, MD  Continuous Blood Gluc Sensor (FREESTYLE LIBRE 14 DAY SENSOR) MISC CHANGE EVERY 14 DAYS Patient taking differently: Inject 1 patch into the skin every 14 (fourteen) days.  07/26/19   Susy Frizzle, MD  dexlansoprazole (DEXILANT) 60 MG capsule Take 1 capsule (60 mg total) by mouth daily. 04/27/19   Susy Frizzle, MD  diazepam  (VALIUM) 5 MG tablet Take 1 tablet (5 mg total) by mouth every 12 (twelve) hours as needed for anxiety (insomnia). Patient taking differently: Take 5 mg by mouth every 12 (twelve) hours as needed (anxiety or insomnia).  06/07/19   Susy Frizzle, MD  diltiazem (CARDIZEM) 120 MG tablet Take 120 mg by mouth daily.    [provider]  ergocalciferol (VITAMIN D2) 1.25 MG (50000 UT) capsule Take 1 capsule (50,000 Units total) by mouth once a week. Patient taking differently: Take 50,000 Units by mouth every Tuesday.  05/24/19   Susy Frizzle, MD  escitalopram (LEXAPRO) 10 MG tablet Take 1 tablet (10 mg total) by mouth daily. 06/07/19   Susy Frizzle, MD  furosemide (LASIX) 40 MG tablet Take 1 tablet (40 mg total) by mouth daily. 08/09/19  Susy Frizzle, MD  HYDROcodone-acetaminophen [facility]) 10-325 MG per tablet Take 1 tablet by mouth every 6 (six) hours as needed for moderate pain.     [provider]  ipratropium-albuterol (DUONEB) 0.5-2.5 (3) MG/3ML SOLN INHALE 1 VIAL VIA NEBULIZER EVERY 6 HOURS AS NEEDED Patient taking differently: Take 3 mLs by nebulization every 6 (six) hours as needed (as directed).  10/25/18   Susy Frizzle, MD  losartan (COZAAR) 100 MG tablet Take 1 tablet (100 mg total) by mouth daily. 04/27/19   Susy Frizzle, MD  metFORMIN (GLUCOPHAGE) 1000 MG tablet Take 1 tablet (1,000 mg total) by mouth 2 (two) times daily with a meal. 08/19/19   Susy Frizzle, MD  OXYGEN Inhale 2 L into the lungs at bedtime.     [provider]  potassium chloride SA (KLOR-CON) 20 MEQ tablet Take 1 tablet (20 mEq total) by mouth daily. 09/09/19   Susy Frizzle, MD  rivaroxaban (XARELTO) 20 MG TABS tablet Take 1 tablet (20 mg total) by mouth daily with supper. 08/24/19   [place], Modena Nunnery, MD  rOPINIRole (REQUIP) 2 MG tablet Take 1 tablet (2 mg total) by mouth 3 (three) times daily. Patient taking differently: Take 2 mg by mouth at bedtime.  04/27/19    Susy Frizzle, MD  Tiotropium Bromide-Olodaterol 2.5-2.5 MCG/ACT AERS Inhale 2 puffs into the lungs daily. Patient taking differently: Inhale 1 puff into the lungs daily.  06/07/19   Susy Frizzle, MD    Allergies    Guaifenesin & derivatives, Tape, Benazepril, Morphine, Zyrtec [cetirizine], and Latex  Review of Systems   Review of Systems  Constitutional: Negative for chills and fever.  HENT: Negative for congestion.   Eyes: Negative for pain.  Respiratory: Positive for shortness of breath. Negative for cough.   Cardiovascular: Negative for chest pain and leg swelling.  Gastrointestinal: Negative for abdominal pain and vomiting.  Genitourinary: Negative for dysuria.  Musculoskeletal: Negative for myalgias.  Skin: Negative for rash.  Neurological: Positive for weakness and numbness. Negative for dizziness and headaches.    Physical Exam Updated Vital Signs BP (!) 128/113   Pulse 92   Temp (!) 97.4 F (36.3 C) (Oral)   Resp 20   Ht 5\' 4"  (1.626 m)   Wt 87.5 kg Comment: per record  SpO2 93%   BMI 33.13 kg/m   Physical Exam Vitals and nursing note reviewed.  Constitutional:      General: She is not in acute distress. HENT:     Head: Normocephalic and atraumatic.     Nose: Nose normal.  Eyes:     General: No scleral icterus. Cardiovascular:     Rate and Rhythm: Normal rate and regular rhythm.     Pulses: Normal pulses.     Heart sounds: Normal heart sounds.  Pulmonary:     Effort: Pulmonary effort is normal. No respiratory distress.     Breath sounds: No wheezing.  Abdominal:     Palpations: Abdomen is soft.     Tenderness: There is no abdominal tenderness.  Musculoskeletal:     Cervical back: Normal range of motion.     Right lower leg: No edema.     Left lower leg: No edema.  Skin:    General: Skin is warm and dry.     Capillary Refill: Capillary refill takes less than 2 seconds.  Neurological:     Mental Status: She is alert. Mental status is at  baseline.  Comments: Patient has decrease sensation from bilateral knees down.  Does seem to have decreased sharp dull differentiation in this distribution as well.  Deferred ambulation as patient states she is unable to walk.  Strength with hip flexion and extension is 5/5 bilaterally.  Knee flexion extension 4/5 bilaterally.  Right foot with 5/5 plantar and dorsiflexion.  Left foot with 3/5 plantar and dorsiflexion.  Patient with very slow movements of left foot.  Unable to conduct rapid alternating movements due to this slow movement in her left foot.  Cranial nerves unremarkable.  Alert and oriented x3.  Speech is clear and fluent.  Has intact 5/5 upper extremity strength.  Sensation intact in upper extremities. Cranial nerves unremarkable.  Psychiatric:        Mood and Affect: Mood normal.        Behavior: Behavior normal.     ED Results / Procedures / Treatments   Labs (all labs ordered are listed, but only abnormal results are displayed) Labs Reviewed  URINALYSIS, ROUTINE W REFLEX MICROSCOPIC - Abnormal; Notable for the following components:      Result Value   Color, Urine STRAW (*)    Glucose, UA >=500 (*)    All other components within normal limits  COMPREHENSIVE METABOLIC PANEL - Abnormal; Notable for the following components:   Glucose, Bld 124 (*)    Albumin 3.3 (*)    Total Bilirubin 0.2 (*)    All other components within normal limits  MAGNESIUM - Abnormal; Notable for the following components:   Magnesium 1.6 (*)    All other components within normal limits  URINE CULTURE  SARS CORONAVIRUS 2 (TAT 6-24 HRS)  CBC WITH DIFFERENTIAL/PLATELET    EKG [name] [name]  Radiology MR BRAIN WO CONTRAST  Result Date: 09/11/2019 CLINICAL DATA:  Initial evaluation for acute difficulty walking, stroke suspected. EXAM: MRI HEAD WITHOUT CONTRAST TECHNIQUE: Multiplanar, multiecho pulse sequences of the brain and surrounding structures were obtained without intravenous contrast.  COMPARISON:  Comparison made with multiple recent brain MRIs, most recent of which from 09/07/2019. FINDINGS: Brain: Generalized age-related cerebral atrophy with chronic microvascular ischemic disease again noted. Multiple remote lacunar infarcts present within the hemispheric cerebral white matter. Continued interval evolution of previously seen subacute watershed type infarcts involving the bilateral cerebral hemispheres. However, multiple new watershed type infarcts are now seen on today's exam, increased in size and number but with similar distribution as compared to previous. Overall, involvement is slightly worse within the right cerebral hemisphere as compared to the left. New patchy involvement of the bilateral parieto-occipital regions as well, also greater on the right (series 5, image 66). Now seen are a few scattered foci of susceptibility artifact associated with these infarcts, consistent with associated petechial hemorrhage (series 14, image 46). Additionally, there is a serpiginous focus of FLAIR and susceptibility artifact seen layering along a cortical sulcus within the right frontal lobe, concerning for a small volume subarachnoid hemorrhage, new from previous (series 14, image 46). No frank intraparenchymal hematoma. No significant mass effect. No mass lesion or midline shift. No hydrocephalus. No extra-axial fluid collection. Pituitary gland suprasellar region normal. Midline structures intact. Vascular: Major intracranial vascular flow voids are maintained. Skull and upper cervical spine: Craniocervical junction normal. Upper cervical spine within normal limits. Bone marrow signal intensity normal. No scalp soft tissue abnormality. Sinuses/Orbits: Globes and orbital soft tissues within normal limits. Paranasal sinuses remain largely clear. No significant mastoid effusion. Other: [name] [name]. IMPRESSION: 1. Continued interval evolution of previously seen subacute infarcts,  with multiple new scattered  acute ischemic watershed type infarcts involving the bilateral cerebral hemispheres, right greater than left. Associated petechial hemorrhage about a few of these infarcts without frank hemorrhagic transformation. 2. Small volume subarachnoid hemorrhage layering along the cortical sulcus within the right frontal lobe, new from previous. 3. Otherwise stable exam with underlying cerebral atrophy, chronic microvascular ischemic disease, and multiple remote lacunar infarcts involving the hemispheric cerebral white matter. Critical Value/emergent results were called by telephone at the time of interpretation on 09/11/2019 at 8:43 pm to provider [facility] , who verbally acknowledged these results. Electronically Signed   By: Jeannine Boga M.D.   On: 09/11/2019 20:43     Procedures Procedures (including critical care time)  Medications Ordered in ED Medications  sodium chloride flush (NS) 0.9 % injection 3 mL (3 mLs Intravenous Given 09/11/19 2047)  rOPINIRole (REQUIP) tablet 2 mg (2 mg Oral Given 09/11/19 1644)  magnesium chloride (SLOW-MAG) 64 MG SR tablet 64 mg (64 mg Oral Given 09/11/19 1901)  LORazepam (ATIVAN) tablet 0.5 mg (0.5 mg Oral Given 09/11/19 1940)  acetaminophen (TYLENOL) tablet 1,000 mg (1,000 mg Oral Given 09/11/19 2036)    ED Course  I have reviewed the triage vital signs and the nursing notes.  Pertinent labs & imaging results that were available during my care of the patient were reviewed by me and considered in my medical decision making (see chart for details).    MDM Rules/Calculators/A&P                      Patient is 69 year old female with a history of strokes bilateral watershed distribution left greater than right that were diagnosed by MRI 3 weeks ago.  After discharge home she was diagnosed with pulmonary embolisms and placed on Xarelto.  She had MRI conducted 4 days ago for bilateral leg weakness which showed mild interval progression of watershed strokes but no  acute changes.  However she is since that time become weaker and weaker and is now unable to walk.  Discussed case with Dr. Rory Percy who recommended MRI without contrast to assess for interval changes.  Patient with Covid test pending.  Unremarkable CMP, UA, CBC and mildly low magnesium 1.6 which will be repleted orally.  EKG unremarkable.  No evidence of ischemia.  8:45 p.m.-call from radiologist to inform that there is small subarachnoid hemorrhage as well as new stroke burden bilateral watershed distribution again.  8:50 pm -I reevaluated patient at bedside she continues to be mentating well with no new neurologic changes.  9:15 PM discussed case with Dr. Lorraine Lax of neurology who reviewed MRI of today and 4 days ago.  Recommended giving blood pressure less than 180.  Recommended that patient be admitted to hospital and suggest that he will likely transition patient to Eliquis.  Appreciate Dr. Lorraine Lax and Dr. Johny Chess help in this case.   Patient is well-appearing at this time.  Is understanding of plan to admit.  I discussed with son he is understanding of plan.  Final Clinical Impression(s) / ED Diagnoses Final diagnoses:  Cerebrovascular accident (CVA) due to embolism of precerebral artery [facility])    Rx / DC Orders ED Discharge Orders    [name] [name]       Tedd Sias, [place] 09/11/19 2121    Milton Ferguson, MD 09/12/19 [phone]

## 2019-09-12 ENCOUNTER — Ambulatory Visit: Payer: Self-pay | Admitting: *Deleted

## 2019-09-12 ENCOUNTER — Other Ambulatory Visit: Payer: Self-pay | Admitting: *Deleted

## 2019-09-12 LAB — GLUCOSE, CAPILLARY
Glucose-Capillary: 146 mg/dL — ABNORMAL HIGH (ref 70–99)
Glucose-Capillary: 151 mg/dL — ABNORMAL HIGH (ref 70–99)
Glucose-Capillary: 226 mg/dL — ABNORMAL HIGH (ref 70–99)

## 2019-09-12 LAB — URINE CULTURE: Culture: <10000 — AB

## 2019-09-12 LAB — CBG MONITORING, ED: Glucose-Capillary: 161 mg/dL — ABNORMAL HIGH (ref 70–99)

## 2019-09-12 LAB — SARS CORONAVIRUS 2 (TAT 6-24 HRS): SARS Coronavirus 2: NEGATIVE

## 2019-09-12 LAB — MAGNESIUM: Magnesium: 1.6 mg/dL — ABNORMAL LOW (ref 1.7–2.4)

## 2019-09-12 MED ORDER — HYDROCODONE-ACETAMINOPHEN 10-325 MG PO TABS
1.0000 | ORAL_TABLET | Freq: Four times a day (QID) | ORAL | Status: DC | PRN
  Administered 2019-09-13 – 2019-09-14 (×3): 1 via ORAL
  Filled 2019-09-12 (×3): qty 1

## 2019-09-12 MED ORDER — SODIUM CHLORIDE 0.9 % IV SOLN: INTRAVENOUS | Status: DC

## 2019-09-12 MED ORDER — SODIUM CHLORIDE 0.9 % IV BOLUS
500.0000 mL | Freq: Once | INTRAVENOUS | Status: AC
  Administered 2019-09-12: 10:00:00 500 mL via INTRAVENOUS

## 2019-09-12 MED ORDER — HYDROCODONE-ACETAMINOPHEN 10-325 MG PO TABS: 1.0000 | ORAL_TABLET | Freq: Four times a day (QID) | ORAL | Status: DC | PRN

## 2019-09-12 MED ORDER — HYDROCODONE-ACETAMINOPHEN 10-325 MG PO TABS
1.0000 | ORAL_TABLET | Freq: Once | ORAL | Status: AC
  Administered 2019-09-12: 16:00:00 1 via ORAL
  Filled 2019-09-12: qty 1

## 2019-09-12 MED ORDER — ROPINIROLE HCL 1 MG PO TABS
2.0000 mg | ORAL_TABLET | Freq: Every day | ORAL | Status: DC
  Administered 2019-09-12: 02:00:00 2 mg via ORAL
  Filled 2019-09-12: qty 2

## 2019-09-12 MED ORDER — SODIUM CHLORIDE 0.9 % IV BOLUS
500.0000 mL | Freq: Once | INTRAVENOUS | Status: AC
  Administered 2019-09-12: 08:00:00 500 mL via INTRAVENOUS

## 2019-09-12 MED ORDER — MAGNESIUM SULFATE 2 GM/50ML IV SOLN
2.0000 g | Freq: Once | INTRAVENOUS | Status: AC
  Administered 2019-09-12: 17:00:00 2 g via INTRAVENOUS
  Filled 2019-09-12: qty 50

## 2019-09-12 MED ORDER — ROPINIROLE HCL 1 MG PO TABS
2.0000 mg | ORAL_TABLET | Freq: Three times a day (TID) | ORAL | Status: DC
  Administered 2019-09-12 – 2019-09-14 (×7): 2 mg via ORAL
  Filled 2019-09-12 (×10): qty 2

## 2019-09-12 MED ORDER — SALINE SPRAY 0.65 % NA SOLN
1.0000 | Freq: Once | NASAL | Status: AC
  Administered 2019-09-12: 02:00:00 1 via NASAL
  Filled 2019-09-12: qty 44

## 2019-09-12 MED ORDER — DIAZEPAM 5 MG PO TABS
5.0000 mg | ORAL_TABLET | Freq: Two times a day (BID) | ORAL | Status: DC | PRN
  Administered 2019-09-12: 02:00:00 5 mg via ORAL
  Filled 2019-09-12: qty 1

## 2019-09-12 MED ORDER — APIXABAN 5 MG PO TABS
5.0000 mg | ORAL_TABLET | Freq: Two times a day (BID) | ORAL | Status: DC
  Administered 2019-09-12 – 2019-09-14 (×5): 5 mg via ORAL
  Filled 2019-09-12 (×6): qty 1

## 2019-09-12 NOTE — ED Notes (Signed)
Breakfast ordered 

## 2019-09-12 NOTE — Progress Notes (Signed)
Rehab Admissions Coordinator Note:  Patient was screened by Cleatrice Burke for appropriateness for an Inpatient Acute Rehab Consult per PT recs.  At this time, we are recommending Inpatient Rehab consult. I will place order per protocol.  Cleatrice Burke RN MSN 09/12/2019, 7:23 PM  I can be reached at [phone].

## 2019-09-12 NOTE — ED Notes (Signed)
Pt requesting norco, valium, and ropinirole. MD paged.

## 2019-09-12 NOTE — ED Notes (Signed)
Neuro rounding on Pt and additional orders for IV fluids given

## 2019-09-12 NOTE — Progress Notes (Signed)
New Admission Note:  Arrival Method: via stretcher from [facility] ED Mental Orientation: Patient is alert and oriented to person, place, time and situation Telemetry: yes  Assessment: Completed Skin: Skin is clean and dry IV: IV in Left posterior Forearm with 75 mL/hr infusing Pain: pt c/o pain in her left shoulder from a fracture prior to this admission Safety Measures: Safety Fall Prevention Plan was given, discussed. Admission: Completed 3W: Patient has been orientated to the room, unit and the staff. Family: Pt has a son at the bedside that is supportive and pleasant.  Orders have been reviewed and implemented. Will continue to monitor the patient. Call light has been placed within reach and bed alarm has been activated.   Janus Molder ,RN

## 2019-09-12 NOTE — ED Notes (Signed)
Pt positioned upright in bed to eat breakfast.

## 2019-09-12 NOTE — Progress Notes (Addendum)
Inpatient Diabetes Program Recommendations  AACE/ADA: New Consensus Statement on Inpatient Glycemic Control (2015)  Target Ranges:  Prepandial:   less than 140 mg/dL      Peak postprandial:   less than 180 mg/dL (1-2 hours)      Critically ill patients:  140 - 180 mg/dL   Lab Results  Component Value Date   GLUCAP 161 (H) 09/12/2019   HGBA1C 9.4 (H) 08/19/2019    Review of Glycemic Control Results for [name] [name], [name] [name]" (MRN [id number]) as of 09/12/2019 11:46  Ref. Range 08/20/2019 06:41 09/07/2019 16:31 09/11/2019 22:26 09/12/2019 07:35  Glucose-Capillary Latest Ref Range: 70 - 99 mg/dL 181 (H) 147 (H) 138 (H) 161 (H)   Diabetes history: Type 2 DM Outpatient Diabetes medications: Metformin 1000 mg BID Current orders for Inpatient glycemic control: Novolog 0-9 units TID, Novolog 0-5 unit QHS  Inpatient Diabetes Program Recommendations:    Current glucose trends okay, will continue to follow.   Addendum: Spoke with patient regarding outpatient diabetes management. Reports her PCP recently had her on Jardiance, but this didn't work to bring down her A1C and within the last month put her back on Metformin. Reviewed patient's current A1c of 9.4%. Explained what a A1c is and what it measures. Also reviewed goal A1c with patient, importance of good glucose control @ home, and blood sugar goals. Reviewed patho of DM, role of pancreas, vascular changes and comorbidities.  Patient has a meter and was using Freestyle Libre; CBGs range in the 140's mg/dL. Encouraged to continue checking 2-3 times per day, regardless of method. Reviewed when to call MD and encouraged mindfulness of carbohydrate intake.  Patient is follow up with PCP in next 2-3 weeks. Patient has not further questions at this time.     Thanks, Bronson Curb, MSN, RNC-OB Diabetes Coordinator [phone] (8a-5p)

## 2019-09-12 NOTE — Plan of Care (Signed)
  Problem: Education: Goal: Knowledge of disease or condition will improve Outcome: Progressing   Problem: Self-Care: Goal: Ability to participate in self-care as condition permits will improve Outcome: Progressing   Problem: Safety: Goal: Ability to remain free from injury will improve Outcome: Progressing

## 2019-09-12 NOTE — Progress Notes (Signed)
ANTICOAGULATION CONSULT NOTE - Initial Consult  Pharmacy Consult for Eliquis Indication:  H/O DVT/PE and new CVA  Allergies  Allergen Reactions  . Guaifenesin & Derivatives Anaphylaxis    Severe Reaction  . Tape Other (See Comments)    TAPE PEELS OFF THE SKIN!!!!!!!  . Benazepril Cough  . Morphine Nausea Only  . Zyrtec [Cetirizine] Swelling and Other (See Comments)    Hands and feet became swollen  . Latex Rash and Other (See Comments)    No powdered gloves!!    Patient Measurements: Height: 5\' 4"  (162.6 cm) Weight: 193 lb (87.5 kg)(per record) IBW/kg (Calculated) : 54.7  Vital Signs: BP: 92/64 (01/31 2235) Pulse Rate: 69 (02/01 0200)  Labs: Recent Labs    09/11/19 1556  HGB 12.3  HCT 38.2  PLT 315  CREATININE 0.79    Estimated Creatinine Clearance: 70 mL/min (by C-G formula based on SCr of 0.79 mg/dL).   Medical History: Past Medical History:  Diagnosis Date  . Aneurysm, thoracic aortic ([place])   . Anxiety   . Arthritis   . CAD (coronary artery disease)   . Chronic kidney disease    Renal insufficiency  . COPD (chronic obstructive pulmonary disease) ([place])   . Depression   . Diabetes mellitus   . Diverticulosis of colon (without mention of hemorrhage)   . Duodenitis without mention of hemorrhage   . Embolic stroke ([place])   . GERD (gastroesophageal reflux disease)   . Hemorrhoids   . Hypercholesteremia   . Hypertension   . Insomnia   . Pulmonary embolism ([place])   . Tinnitus   . Vertigo     Medications:  No current facility-administered medications on file prior to encounter.   Current Outpatient Medications on File Prior to Encounter  Medication Sig Dispense Refill  . rivaroxaban (XARELTO) 20 MG TABS tablet Take 1 tablet (20 mg total) by mouth daily with supper. 30 tablet 2  . albuterol (VENTOLIN HFA) 108 (90 Base) MCG/ACT inhaler Inhale 2 puffs into the lungs every 4 (four) hours as needed for wheezing or shortness of breath. 54 g 3  . atorvastatin  (LIPITOR) 20 MG tablet Take 1 tablet (20 mg total) by mouth daily. (Patient taking differently: Take 20 mg by mouth at bedtime. ) 90 tablet 3  . bimatoprost (LUMIGAN) 0.01 % SOLN Place 1 drop into both eyes at bedtime.    . brinzolamide (AZOPT) 1 % ophthalmic suspension Place 1 drop into both eyes every 12 (twelve) hours.     . clopidogrel (PLAVIX) 75 MG tablet Take 1 tablet (75 mg total) by mouth daily. (Patient not taking: Reported on 09/07/2019) 30 tablet 1  . Continuous Blood Gluc Sensor (FREESTYLE LIBRE 14 DAY SENSOR) MISC CHANGE EVERY 14 DAYS (Patient taking differently: Inject 1 patch into the skin every 14 (fourteen) days. ) 2 each 3  . dexlansoprazole (DEXILANT) 60 MG capsule Take 1 capsule (60 mg total) by mouth daily. 90 capsule 3  . diazepam (VALIUM) 5 MG tablet Take 1 tablet (5 mg total) by mouth every 12 (twelve) hours as needed for anxiety (insomnia). (Patient taking differently: Take 5 mg by mouth every 12 (twelve) hours as needed (anxiety or insomnia). ) 60 tablet 1  . diltiazem (CARDIZEM) 120 MG tablet Take 120 mg by mouth daily.    . ergocalciferol (VITAMIN D2) 1.25 MG (50000 UT) capsule Take 1 capsule (50,000 Units total) by mouth once a week. (Patient taking differently: Take 50,000 Units by mouth every Tuesday. ) 24  capsule 0  . escitalopram (LEXAPRO) 10 MG tablet Take 1 tablet (10 mg total) by mouth daily. 90 tablet 3  . furosemide (LASIX) 40 MG tablet Take 1 tablet (40 mg total) by mouth daily. 30 tablet 3  . HYDROcodone-acetaminophen (NORCO) 10-325 MG per tablet Take 1 tablet by mouth every 6 (six) hours as needed for moderate pain.     Marland Kitchen ipratropium-albuterol (DUONEB) 0.5-2.5 (3) MG/3ML SOLN INHALE 1 VIAL VIA NEBULIZER EVERY 6 HOURS AS NEEDED (Patient taking differently: Take 3 mLs by nebulization every 6 (six) hours as needed (as directed). ) 360 mL 0  . losartan (COZAAR) 100 MG tablet Take 1 tablet (100 mg total) by mouth daily. 90 tablet 3  . metFORMIN (GLUCOPHAGE) 1000 MG  tablet Take 1 tablet (1,000 mg total) by mouth 2 (two) times daily with a meal. 60 tablet 1  . OXYGEN Inhale 2 L into the lungs at bedtime.     . potassium chloride SA (KLOR-CON) 20 MEQ tablet Take 1 tablet (20 mEq total) by mouth daily. 5 tablet 0  . rOPINIRole (REQUIP) 2 MG tablet Take 1 tablet (2 mg total) by mouth 3 (three) times daily. (Patient taking differently: Take 2 mg by mouth at bedtime. ) 270 tablet 3  . Tiotropium Bromide-Olodaterol 2.5-2.5 MCG/ACT AERS Inhale 2 puffs into the lungs daily. (Patient taking differently: Inhale 1 puff into the lungs daily. ) 12 g 3     Assessment: 69 y.o. female with h/o PE/DVT on Xarelto at home (last dose AM Sun 1/31), now presenting with new CVA, switching anticoagulants to Eliquis  Plan:  Eliquis 5 mg BID  Caryl Pina 09/12/2019,3:14 AM

## 2019-09-12 NOTE — ED Notes (Signed)
MD called to report low BP . New orders given for fluid bolus

## 2019-09-12 NOTE — ED Notes (Signed)
Pt requesting "nasal spray." order placed per standing orders.

## 2019-09-12 NOTE — Progress Notes (Signed)
STROKE TEAM PROGRESS NOTE   INTERVAL HISTORY Patient in the ED. Very sleepy. BP low after receiving fluid bolus this am. Will give an additional 518mL followed by IVF at 70.  Patient had recent bilateral strokes in watershed distribution on 08/18/2019 from cryptogenic etiology and now presented with worsening left-sided weakness and MRI shows additional new right parietal embolic infarcts.  Vitals:   09/12/19 0545 09/12/19 0700 09/12/19 0738 09/12/19 0749  BP:    (!) 70/52  Pulse:  81 73   Resp: 19 17 18    Temp:      TempSrc:      SpO2:  93% 92%   Weight:      Height:        CBC:  Recent Labs  Lab 09/07/19 1623 09/07/19 1623 09/07/19 1641 09/11/19 1556  WBC 12.0*  --   --  9.9  NEUTROABS 7.7  --   --  5.2  HGB 12.8   < > 13.3 12.3  HCT 39.8   < > 39.0 38.2  MCV 89.0  --   --  88.6  PLT 340  --   --  315   < > = values in this interval not displayed.    Basic Metabolic Panel:  Recent Labs  Lab 09/07/19 1623 09/07/19 1623 09/07/19 1641 09/11/19 1556  NA 137   < > 138 141  K 3.3*   < > 3.2* 3.8  CL 99   < > 96* 106  CO2 25  --   --  25  GLUCOSE 142*   < > 142* 124*  BUN 17   < > 19 12  CREATININE 0.95   < > 0.90 0.79  CALCIUM 9.1  --   --  9.4  MG  --   --   --  1.6*   < > = values in this interval not displayed.   Lipid Panel:     Component Value Date/Time   CHOL 136 08/19/2019 0500   TRIG 70 08/19/2019 0500   HDL 53 08/19/2019 0500   CHOLHDL 2.6 08/19/2019 0500   VLDL 14 08/19/2019 0500   LDLCALC 69 08/19/2019 0500   LDLCALC 98 04/26/2019 0932   HgbA1c:  Lab Results  Component Value Date   HGBA1C 9.4 (H) 08/19/2019   Urine Drug Screen:     Component Value Date/Time   LABOPIA POSITIVE (A) 09/07/2019 1950   COCAINSCRNUR NONE DETECTED 09/07/2019 1950   LABBENZ POSITIVE (A) 09/07/2019 1950   AMPHETMU NONE DETECTED 09/07/2019 1950   THCU NONE DETECTED 09/07/2019 1950   LABBARB NONE DETECTED 09/07/2019 1950    Alcohol Level     Component Value  Date/Time   ETH <10 09/07/2019 1623    IMAGING past 48 hours MR BRAIN WO CONTRAST  Result Date: 09/11/2019 CLINICAL DATA:  Initial evaluation for acute difficulty walking, stroke suspected. EXAM: MRI HEAD WITHOUT CONTRAST TECHNIQUE: Multiplanar, multiecho pulse sequences of the brain and surrounding structures were obtained without intravenous contrast. COMPARISON:  Comparison made with multiple recent brain MRIs, most recent of which from 09/07/2019. FINDINGS: Brain: Generalized age-related cerebral atrophy with chronic microvascular ischemic disease again noted. Multiple remote lacunar infarcts present within the hemispheric cerebral white matter. Continued interval evolution of previously seen subacute watershed type infarcts involving the bilateral cerebral hemispheres. However, multiple new watershed type infarcts are now seen on today's exam, increased in size and number but with similar distribution as compared to previous. Overall, involvement is slightly worse within the  right cerebral hemisphere as compared to the left. New patchy involvement of the bilateral parieto-occipital regions as well, also greater on the right (series 5, image 66). Now seen are a few scattered foci of susceptibility artifact associated with these infarcts, consistent with associated petechial hemorrhage (series 14, image 46). Additionally, there is a serpiginous focus of FLAIR and susceptibility artifact seen layering along a cortical sulcus within the right frontal lobe, concerning for a small volume subarachnoid hemorrhage, new from previous (series 14, image 46). No frank intraparenchymal hematoma. No significant mass effect. No mass lesion or midline shift. No hydrocephalus. No extra-axial fluid collection. Pituitary gland suprasellar region normal. Midline structures intact. Vascular: Major intracranial vascular flow voids are maintained. Skull and upper cervical spine: Craniocervical junction normal. Upper cervical  spine within normal limits. Bone marrow signal intensity normal. No scalp soft tissue abnormality. Sinuses/Orbits: Globes and orbital soft tissues within normal limits. Paranasal sinuses remain largely clear. No significant mastoid effusion. Other: None. IMPRESSION: 1. Continued interval evolution of previously seen subacute infarcts, with multiple new scattered acute ischemic watershed type infarcts involving the bilateral cerebral hemispheres, right greater than left. Associated petechial hemorrhage about a few of these infarcts without frank hemorrhagic transformation. 2. Small volume subarachnoid hemorrhage layering along the cortical sulcus within the right frontal lobe, new from previous. 3. Otherwise stable exam with underlying cerebral atrophy, chronic microvascular ischemic disease, and multiple remote lacunar infarcts involving the hemispheric cerebral white matter. Critical Value/emergent results were called by telephone at the time of interpretation on 09/11/2019 at 8:43 pm to provider [facility] , who verbally acknowledged these results. Electronically Signed   By: Jeannine Boga M.D.   On: 09/11/2019 20:43    PHYSICAL EXAM Elderly Caucasian lady appears sleepy but not in distress.  Right-sided pulses and blood pressure appear feeble compared to the left side  . Afebrile. Head is nontraumatic. Neck is supple without bruit.    Cardiac exam no murmur or gallop. Lungs are clear to auscultation. Distal pulses are well felt. Neurological Exam :  She is awake alert oriented to time place and person.  She follows commands well.  Pupils are equal reactive.  Extraocular movements are full range without nystagmus.  She blinks to threat bilaterally.  Mild left lower facial asymmetry.  Tongue midline.  Motor system exam shows mild left hemiparesis with 4/5 left upper extremity strength with weakness of grip and intrinsic hand muscles.  Left lower extremity strength is 3/5 with drift.  Sensation is  preserved bilaterally deep tendon flexes are symmetric plantars downgoing.  Gait not tested. ASSESSMENT/PLAN [name] [name] is a 69 y.o. female with history of hypertension, hyperlipidemia, diabetes mellitus, coronary artery disease with a recent diagnosis of bilateral strokes in the watershed distribution on 08/18/2019, pulmonary embolism on Xarelto on 1/13, PFO noted on TCD presenting with worsening L sided weakness.    Stroke:   New R>L Cerebral hemisphere watershed infarcts w/ petechial hemorrhage in pt w/ B cerebral and R occipital infarcts last week as well as PE and DVT on Xarelto without missing any doses. Also NEW R frontal lobe SAH. ? .  MRI  Evolution of multifocal subacute watershed infarct B cerebral hemispheres with NEW R>L infarcts with associated petechial hemorrhage in a few. Small SAH R frontal lobe cortical sulcus, new from last week. Stable Small vessel disease. Atrophy.   Carotid stenosis ruled out:  Previous CTA showed R ICA 50% stenosis however carotid doppler with 1-39% R ICA stenosis  LDL  69  HgbA1c 9.4  Was discharged on aspirin and plavix. Changed to Xarelto since d/c for new PE. Given new infarcts on xarelto, change to Eliquis PE dosing.   Agree with plans for hypercoagulable workup   Therapy recommendations:  pending   Disposition:  pending   PFO  Found during follow up  Likely incidental finding and not related to current strokes  PE / R lower leg DVT  Found segmental and subsegmental PE by PCP when c/o of SOB   RLE DVT felt to be provoked in setting of recent shoulder fx  Referred to hematology for hypercoagulable work up, not yet done   On Xarelto PTA (stopped aspirin and plavix started for recent stroke)  Change to Eliquis, PE dose  Hypertension Hypotension in ED w/ BP fluctuation between arms . Variable in ED.  Marland Kitchen Permissive hypertension (OK if < 220/120) but gradually normalize in 5-7 days . Long-term BP goal  normotensive  Hyperlipidemia  Home meds:  lipitor 20, resumed in hospital  LDL 69, goal < 70  No not put on intensive statin given LDL < goal and advanced age  Continue statin at discharge  Diabetes type II Uncontrolled  HgbA1c 9.4, goal < 7.0  CBGs Recent Labs    09/11/19 2226 09/12/19 0735  GLUCAP 138* 161*      SSI  Other Stroke Risk Factors  Advanced age  Cigarette smoker, quit ~1 yr ago, advised to stop smoking  Obesity, Body mass index is 33.13 kg/m., recommend weight loss, diet and exercise as appropriate   Hx stroke/TIA  08/2019 - R>L anterior circulation infarct embolic d/t unknown source. Placed on aspirin and plavix x 3 weeks then plavix alone. Found to have PFO as an OP. Found to have PE and started on Xarelto.  Coronary artery disease s/p PCI  Other Active Problems  CKD stage II  COPD/chronic hypoxemic respiratory failure on Home O2 @3L   Hypomagnesemia   Hospital day # 1 Patient has presented with recurrent strokes of cryptogenic etiology but has had recent DVT and pulmonary embolism and was on anticoagulation with Xarelto.  Transcranial Doppler bubble study had shown a small PFO which was felt not to be significant however given patient's recurrent strokes and failure of anticoagulation would recommend check transesophageal echocardiogram to further evaluate the PFO to see if it is amenable to endovascular treatment.  Agree with changing Xarelto to Eliquis for anticoagulation.  Discussed with Dr. Vivia Ewing.  Greater than 50% time during this 35-minute visit was spent on counseling and coordination of care and discussion with care team and answering questions. Antony Contras, MD  To contact Stroke Continuity provider, please refer to [email or web address] After hours, contact General Neurology

## 2019-09-12 NOTE — Evaluation (Signed)
Physical Therapy Evaluation Patient Details Name: [name] [name] MRN: [id number] DOB: 04-05-1951 Today's Date: 09/12/2019   History of Present Illness  [name] [name] is a 69 y.o. female with medical history significant of CAD, CKD stage II, COPD/chronic hypoxemic respiratory failure on 3 L home oxygen at night, type 2 diabetes, hypertension, hyperlipidemia, thoracic aortic aneurysm, and recently admitted 1/7-1/9 for acute CVA with MRI showing watershed infarcts in bilateral hemispheres.  Dopplers done 1/19 revealed right lower extremity DVT and CT angiogram chest done 1/12 revealed acute bilateral segmental and subsegmental pulmonary emboli.  Seen in ED 1/27 for new weakness, but negative work up so d/c.  Admitted 1/31 with L side weakness; MRI brain done today showing continued interval evolution of previously seen subacute infarcts with multiple new scattered acute ischemic watershed type infarcts involving the bilateral cerebral hemispheres, right greater than left.  Associated petechial hemorrhage about a few of these infarcts without frank hemorrhagic transformation.  Small volume subarachnoid hemorrhage layering along the cortical sulcus within the right frontal lobe, new from previous.  Clinical Impression  Patient presents with mobility limited due to pain LUE, L hemiparesis, decreased balance, decreased safety awareness, decreased activity tolerance and she will benefit from skilled PT in the acute setting to allow return home following CIR level rehab stay.      Follow Up Recommendations CIR    Equipment Recommendations  None recommended by PT    Recommendations for Other Services Rehab consult     Precautions / Restrictions Precautions Precautions: Fall Precaution Comments: L Humeral fx      Mobility  Bed Mobility Overal bed mobility: Needs Assistance Bed Mobility: Supine to Sit     Supine to sit: HOB elevated Sit to supine: Min assist   General bed mobility comments:  cues to use rail, assist for trunk, increased time scooting to EOB  Transfers Overall transfer level: Needs assistance Equipment used: 1 person hand held assist;Rolling walker (2 wheeled) Transfers: Sit to/from [facility] Sit to Stand: Min assist Stand pivot transfers: Min assist       General transfer comment: assist for balance/safety, first HHA on L and using footboard on R, then with RW, then stood to pivot to recliner  Ambulation/Gait Ambulation/Gait assistance: Herbalist (Feet): 8 Feet Assistive device: Rolling walker (2 wheeled) Gait Pattern/deviations: Step-to pattern;Decreased stride length;Decreased dorsiflexion - left;Scissoring     General Gait Details: needing to look at L foot to make sure foot clears then cues for preventing crossing over with steps, stayed at bedside with RW  Stairs            Wheelchair Mobility    Modified Rankin (Stroke Patients Only) Modified Rankin (Stroke Patients Only) Pre-Morbid Rankin Score: Moderate disability Modified Rankin: Moderately severe disability     Balance Overall balance assessment: Needs assistance Sitting-balance support: Feet supported Sitting balance-Leahy Scale: Fair     Standing balance support: Bilateral upper extremity supported;During functional activity Standing balance-Leahy Scale: Poor Standing balance comment: relaint on UE support for balance                             Pertinent Vitals/Pain Pain Score: 6  Pain Location: L shoulder Pain Descriptors / Indicators: Aching Pain Intervention(s): Monitored during session;Repositioned    Home Living Family/patient expects to be discharged to:: Private residence Living Arrangements: Alone Available Help at Discharge: Family(grandaughter helps during the day and sister in law helps when  she can) Type of Home: House Home Access: Ramped entrance(w/c lift)     Home Layout: One level Home Equipment:  Walker - 2 wheels;Wheelchair - manual;Shower seat;Grab bars - toilet;Grab bars - tub/shower      Prior Function Level of Independence: Needs assistance   Gait / Transfers Assistance Needed: using walker occasionally, but got worse over day and half pta  ADL's / Homemaking Assistance Needed: son helped with shower        Hand Dominance   Dominant Hand: Right    Extremity/Trunk Assessment   Upper Extremity Assessment Upper Extremity Assessment: LUE deficits/detail LUE Deficits / Details: AROM shoulder NT due to pain from preior injury, assist to lift hand to RW LUE Sensation: WNL LUE Coordination: decreased fine motor;decreased gross motor    Lower Extremity Assessment Lower Extremity Assessment: LLE deficits/detail LLE Deficits / Details: limited ankle DF due to weakness (strength 2+/5); knee extension 4-/5, reports numbness both feet from PN, LLE Sensation: decreased light touch       Communication   Communication: No difficulties  Cognition Arousal/Alertness: Awake/alert Behavior During Therapy: WFL for tasks assessed/performed Overall Cognitive Status: Within Functional Limits for tasks assessed                                        General Comments General comments (skin integrity, edema, etc.): RN in room to deliver meds for her shoulder end of session    Exercises     Assessment/Plan    PT Assessment Patient needs continued PT services  PT Problem List Decreased strength;Decreased balance;Decreased activity tolerance;Decreased mobility;Decreased safety awareness;Pain;Decreased knowledge of use of DME;Decreased knowledge of precautions       PT Treatment Interventions DME instruction;Therapeutic activities;Balance training;Therapeutic exercise;Functional mobility training;Gait training;Patient/family education    PT Goals (Current goals can be found in the Care Plan section)  Acute Rehab PT Goals Patient Stated Goal: wants to go to  rehab PT Goal Formulation: With patient Time For Goal Achievement: 09/26/19 Potential to Achieve Goals: Good    Frequency Min 4X/week   Barriers to discharge        Co-evaluation               AM-PAC PT "6 Clicks" Mobility  Outcome Measure Help needed turning from your back to your side while in a flat bed without using bedrails?: A Little Help needed moving from lying on your back to sitting on the side of a flat bed without using bedrails?: A Little Help needed moving to and from a bed to a chair (including a wheelchair)?: A Little Help needed standing up from a chair using your arms (e.g., wheelchair or bedside chair)?: A Little Help needed to walk in hospital room?: A Little Help needed climbing 3-5 steps with a railing? : A Lot 6 Click Score: 17    End of Session   Activity Tolerance: Patient limited by fatigue;No increased pain Patient left: in chair;with call bell/phone within reach Nurse Communication: Mobility status PT Visit Diagnosis: Other abnormalities of gait and mobility (R26.89);Hemiplegia and hemiparesis Hemiplegia - Right/Left: Left Hemiplegia - dominant/non-dominant: Non-dominant Hemiplegia - caused by: Cerebral infarction    [id number] PT Time Calculation (min) (ACUTE ONLY): 30 min   Charges:   PT Evaluation $PT Eval Moderate Complexity: 1 Mod PT Treatments $Therapeutic Activity: 8-22 mins        Magda Kiel, PT Acute Rehabilitation Services  [phone] 09/12/2019   Reginia Naas 09/12/2019, 5:50 PM

## 2019-09-12 NOTE — ED Notes (Signed)
MD sent message in secure text to report Pt low BP.

## 2019-09-12 NOTE — Progress Notes (Addendum)
PROGRESS NOTE  [name] [name]  DOB: [birth year]  PCP: [name] Frizzle, MD [id number]  DOA: 09/11/2019 Admitted From: Home  LOS: 1 day   Chief Complaint  Patient presents with  . Weakness   Brief narrative: [name] [name] is a 69 y.o. female with PMH significant for HTN, HLD, T2DM, CAD, COPD on 3 L oxygen at home, a recent shoulder fracture, nonsurgically managed with sling and pain medications.  1/7-1/9 - Patient was recently hospitalized with acute bilateral stroke, MRI brain showed bilateral strokes in the watershed distribution.  She was discharged home on aspirin and Plavix.  1/12, she had a in a post hospital evaluation by PCP and was noticed to have shortness of breath.  CT angio of chest showed acute bilateral segmental subsegmental pulmonary bleeding.  Subsequent DVT scan showed right lower extremity DVT.  She was started on Xarelto replacing aspirin and Plavix. 1/19, bubble study done by outpatient neurology showed a small PFO but it was not felt large enough to allow for emboli to pass from right to left side of the heart. 1/27, patient presented to the ED with left-sided weakness.  CT head as well as MRI brain showed evolution of old strokes with no new infarcts and hence patient was subsequently discharged to home. 1/31, patient presents to the ED again with worsening left-sided weakness.  According to the patient, around 3 PM, she developed sudden onset left leg weakness which is worse than her prior weakness as well as worsening left upper extremity weakness.   EMS brought her to the ED.    Blood pressure in the ED would vary significantly between the 2 arms: the right arm was around 0000000 systolicand left arm was 99991111 systolic. Repeat MRI brain this admission showed new infarcts in the same watershed distribution as well as petechial hemorrhagic conversion and small subarachnoid hemorrhage.    Neurologist was consulted for further  recommendations.  Subjective: Patient was seen and examined this morning. Pleasant elderly Caucasian female. Not in physical distress.  Seems to have clear mental status at the time of my evaluation. Left-sided weakness persists on both left upper and left lower extremities.  Assessment/Plan: Acute right parietal occipital lobe infarcts Subacute bilateral watershed territory infarcts Small volume subarachnoid hemorrhage in the right frontal lobe petechial hemorrhages area of prior infarctions -Recently hospitalized 1/7-1/9 for acute bilateral strokes in dorsal distribution.  She had complete stroke work-up done at that time.  -Carotid ultrasounds showed 1 to 39% stenosis bilaterally.   -Echocardiogram showed normal EF and no other acute findings.   -A1c 9.4, LDL 69 at goal.   -She was discharged on aspirin and Plavix x3 months, then Plavix alone.   -As an outpatient, she had a bubble study showed small PFO but it was thought not to be large enough to allow emboli.   -However, patient has now presented with more of acute embolic stroke.   -Neurology following.  Recommended to switch from Xarelto to Eliquis. -Also sent for hypercoagulable work-up. -PT/OT eval ordered. -Continue Eliquis and statin -On examination today, patient has persistent weakness in the left upper extremity and lower extremity.  PFO -?  Need of closure. - cardiology consultation called. Dr. Burt Knack to see her.   Recent pulmonary embolism/lower leg DVT  -Switched from Xarelto to Eliquis.  Essential hypertension Blood pressure fluctuation -In the ED, patient was noted to have significant blood pressure fluctuation between the 2 arms: the right arm was around 0000000 systolicand left arm was 99991111  systolic. -Significant blood pressure variation might have also caused her to have watershed stroke. -Continue to monitor blood pressure.  CKD stage II -Stable.  Renal function at baseline.  COPD/chronic hypoxemic  respiratory failure on 3 L home oxygen at night -Stable.  Continue home oxygen.  DuoNeb as needed.  Hypomagnesemia Potassium 1.6 and received potassium supplementation in the ED. -Continue to monitor magnesium level  Type 2 diabetes -A1c 9.4 -Currently only on Metformin.  Recent A1c 9.4 consistent with poor glycemic control. -Continue sliding scale insulin.  -Consult diabetes coordinator -We will need optimization of therapy and close PCP follow-up  Body mass index is 34.44 kg/m. Mobility: PT eval ordered Diet: Cardiac/diabetic diet Fluid: Normal saline at 75 DVT prophylaxis:  Eliquis Code Status:  Full code Family Communication:  Not at bedside Expected Discharge:  Pending PT evaluation  Consultants:  Neurologist  Procedures:    Antimicrobials: Anti-infectives (From admission, onward)   None        Code Status: Full Code   Diet Order            Diet Carb Modified Fluid consistency: Thin; Room service appropriate? Yes  Diet effective now              Infusions:  . sodium chloride    . magnesium sulfate bolus IVPB      Scheduled Meds: .  stroke: mapping our early stages of recovery book   Does not apply Once  . apixaban  5 mg Oral BID  . atorvastatin  20 mg Oral QHS  . HYDROcodone-acetaminophen  1 tablet Oral Once  . insulin aspart  0-5 Units Subcutaneous QHS  . insulin aspart  0-9 Units Subcutaneous TID WC  . rOPINIRole  2 mg Oral TID    PRN meds: acetaminophen **OR** acetaminophen (TYLENOL) oral liquid 160 mg/5 mL **OR** acetaminophen, diazepam, HYDROcodone-acetaminophen, ipratropium-albuterol, senna-docusate   Objective: Vitals:   09/12/19 1110 09/12/19 1539  BP: (!) 97/52 (!) 178/74  Pulse: 82 84  Resp: 14 16  Temp: 98.2 F (36.8 C) 98 F (36.7 C)  SpO2: 94% 94%    Intake/Output Summary (Last 24 hours) at 09/12/2019 1603 Last data filed at 09/12/2019 1531 Gross per 24 hour  Intake 500 ml  Output 1000 ml  Net -500 ml   Filed  Weights   09/11/19 1427 09/12/19 1110  Weight: 87.5 kg 91 kg   Weight change:  Body mass index is 34.44 kg/m.   Physical Exam: General exam: Appears calm and comfortable.  Skin: No rashes, lesions or ulcers. HEENT: Atraumatic, normocephalic, supple neck, no obvious bleeding Lungs: Clear to auscultation bilaterally CVS: Regular rate and rhythm, no murmur GI/Abd soft, nondistended, nontender, bowel sounds CNS: Alert, awake and oriented x3, 2-3/5 left upper extremity proximal muscle strength, 3/5 left lower extremity strength.  Right side with good strength Psychiatry: Mood appropriate Extremities: No pedal edema no calf tenderness  Data Review: I have personally reviewed the laboratory data and studies available.  Recent Labs  Lab 09/07/19 1601 09/07/19 1623 09/07/19 1641 09/11/19 1556  WBC 11.7* 12.0*  --  9.9  NEUTROABS  --  7.7  --  5.2  HGB 12.7 12.8 13.3 12.3  HCT 40.5 39.8 39.0 38.2  MCV 90.0 89.0  --  88.6  PLT 383 340  --  315   Recent Labs  Lab 09/07/19 1601 09/07/19 1623 09/07/19 1641 09/11/19 1556 09/12/19 0853  NA 136 137 138 141  --   K 2.9* 3.3* 3.2* 3.8  --  CL 99 99 96* 106  --   CO2 24 25  --  25  --   GLUCOSE 151* 142* 142* 124*  --   BUN 18 17 19 12   --   CREATININE 0.96 0.95 0.90 0.79  --   CALCIUM 8.9 9.1  --  9.4  --   MG  --   --   --  1.6* 1.6*    Terrilee Croak, MD  [facility] 09/12/2019

## 2019-09-12 NOTE — ED Notes (Signed)
Pt on 2L Advance when this RN resumed care of pt. Per previous RN pt wears O2 while sleeping at home.

## 2019-09-12 NOTE — Patient Outreach (Signed)
[facility]) Care Management  09/12/2019  [name] [name] [birth year] [id number]   Received [place] Link/ Epic in-basket notification patient had ED visit on 09/07/2019 for left leg and left arm weakness.   Patient admitted on 09/11/2019 for CVA and remains inpatient.   Patient has been referred to [facility] for follow up and RNCM sent the following in-basket message.   Good morning Ladies, I am following [name] for [facility] Stroke Red Alert Follow up and she readmitted to the hospital on 09/11/2019 for CVA.   I have notified Care Management Assistant to cancel EMMI automated calls. Please follow up for discharge planning, disposition, and referral follow up. Thanks, Rafael Hernandez Management Assistant, Gloris [place] notified to discontinue EMMI automated calls due to hospitalization, via in-basket message.   No telephonic outreach at this time due to hospitalization.  Daniell Mancinas H. Annia Friendly, BSN, [place] Management [facility] Telephonic CM Phone: [phone] Fax: [phone]

## 2019-09-13 ENCOUNTER — Ambulatory Visit: Payer: Self-pay

## 2019-09-13 DIAGNOSIS — I639 Cerebral infarction, unspecified: Secondary | ICD-10-CM

## 2019-09-13 LAB — PROTEIN S, TOTAL: Protein S Ag, Total: 99 % (ref 60–150)

## 2019-09-13 LAB — PROTEIN S ACTIVITY: Protein S Activity: 113 % (ref 63–140)

## 2019-09-13 LAB — GLUCOSE, CAPILLARY
Glucose-Capillary: 156 mg/dL — ABNORMAL HIGH (ref 70–99)
Glucose-Capillary: 199 mg/dL — ABNORMAL HIGH (ref 70–99)
Glucose-Capillary: 284 mg/dL — ABNORMAL HIGH (ref 70–99)
Glucose-Capillary: 172 mg/dL — ABNORMAL HIGH (ref 70–99)

## 2019-09-13 LAB — HOMOCYSTEINE: Homocysteine: 7.3 umol/L (ref 0.0–17.2)

## 2019-09-13 LAB — PROTEIN C ACTIVITY: Protein C Activity: 124 % (ref 73–180)

## 2019-09-13 MED ORDER — DILTIAZEM HCL ER COATED BEADS 120 MG PO CP24
120.0000 mg | ORAL_CAPSULE | Freq: Every day | ORAL | Status: DC
  Administered 2019-09-13 – 2019-09-14 (×2): 120 mg via ORAL
  Filled 2019-09-13 (×2): qty 1

## 2019-09-13 MED ORDER — LOSARTAN POTASSIUM 50 MG PO TABS
50.0000 mg | ORAL_TABLET | Freq: Every day | ORAL | Status: DC
  Administered 2019-09-14: 08:00:00 50 mg via ORAL
  Filled 2019-09-13: qty 1

## 2019-09-13 MED ORDER — SODIUM CHLORIDE 0.9 % IV SOLN: INTRAVENOUS | Status: DC

## 2019-09-13 MED ORDER — LIDOCAINE 5 % EX PTCH
1.0000 | MEDICATED_PATCH | CUTANEOUS | Status: DC
  Administered 2019-09-13 – 2019-09-14 (×2): 1 via TRANSDERMAL
  Filled 2019-09-13 (×2): qty 1

## 2019-09-13 MED ORDER — FUROSEMIDE 20 MG PO TABS
20.0000 mg | ORAL_TABLET | Freq: Every day | ORAL | Status: DC
  Administered 2019-09-14: 08:00:00 20 mg via ORAL
  Filled 2019-09-13: qty 1

## 2019-09-13 MED ORDER — FUROSEMIDE 40 MG PO TABS
40.0000 mg | ORAL_TABLET | Freq: Every day | ORAL | Status: DC
  Administered 2019-09-13: 10:00:00 40 mg via ORAL
  Filled 2019-09-13: qty 1

## 2019-09-13 MED ORDER — LOSARTAN POTASSIUM 50 MG PO TABS
100.0000 mg | ORAL_TABLET | Freq: Every day | ORAL | Status: DC
  Administered 2019-09-13: 10:00:00 100 mg via ORAL
  Filled 2019-09-13: qty 2

## 2019-09-13 MED ORDER — INSULIN GLARGINE 100 UNIT/ML ~~LOC~~ SOLN
5.0000 [IU] | Freq: Every day | SUBCUTANEOUS | Status: DC
  Filled 2019-09-13: qty 0.05

## 2019-09-13 NOTE — Progress Notes (Signed)
PROGRESS NOTE  [name] [name]  DOB: 03-08-1951  PCP: Susy Frizzle, MD [id number]  DOA: 09/11/2019 Admitted From: Home  LOS: 2 days   Chief Complaint  Patient presents with  . Weakness   Brief narrative: [name] [name] is a 69 y.o. female with PMH significant for HTN, HLD, T2DM, CAD, COPD on 3 L oxygen at home, a recent shoulder fracture, nonsurgically managed with sling and pain medications.  1/7-1/9 - Patient was recently hospitalized with acute bilateral stroke, MRI brain showed bilateral strokes in the watershed distribution.  She was discharged home on aspirin and Plavix.  1/12, she had a in a post hospital evaluation by PCP and was noticed to have shortness of breath.  CT angio of chest showed acute bilateral segmental subsegmental pulmonary bleeding.  Subsequent DVT scan showed right lower extremity DVT.  She was started on Xarelto replacing aspirin and Plavix. 1/19, bubble study done by outpatient neurology showed a small PFO but it was not felt large enough to allow for emboli to pass from right to left side of the heart. 1/27, patient presented to the ED with left-sided weakness.  CT head as well as MRI brain showed evolution of old strokes with no new infarcts and hence patient was subsequently discharged to home. 1/31, patient presents to the ED again with worsening left-sided weakness.  According to the patient, around 3 PM, she developed sudden onset left leg weakness which is worse than her prior weakness as well as worsening left upper extremity weakness.   EMS brought her to the ED.    Blood pressure in the ED would vary significantly between the 2 arms: the right arm was around 0000000 systolicand left arm was 99991111 systolic. Repeat MRI brain this admission showed new infarcts in the same watershed distribution as well as petechial hemorrhagic conversion and small subarachnoid hemorrhage.    Patient was admitted under hospitalist medicine service for further  evaluation and management.  Neurologist was consulted for further recommendations.  Subjective: Patient was seen and examined this morning. Pleasant elderly Caucasian female. Not in distress.  Neurology was also at bedside at the time of my evaluation.  No change in the status of left-sided weakness.  Pending cardiology evaluation.  Assessment/Plan: Acute right parietal occipital lobe infarcts Subacute bilateral watershed territory infarcts Small volume subarachnoid hemorrhage in the right frontal lobe petechial hemorrhages area of prior infarctions -Recently hospitalized 1/7-1/9 for acute bilateral strokes in dorsal distribution.  She had complete stroke work-up done at that time.  -Carotid ultrasounds showed 1 to 39% stenosis bilaterally.   -Echocardiogram showed normal EF and no other acute findings.   -A1c 9.4, LDL 69 at goal.   -She was discharged on aspirin and Plavix x3 months, then Plavix alone.   -As an outpatient, she had a bubble study showed small PFO but it was thought not to be large enough to allow emboli.   -However, patient has now presented with more of acute embolic stroke.   -Neurology following.  Per recommendation, Xarelto was switched to Eliquis. -Also sent for hypercoagulable work-up. -PT/OT eval appreciated.  CIR recommended.  Insurance authorization in process. -Continue Eliquis and statin -On examination today, patient continues to have persistent weakness in the left upper extremity and lower extremity.  PFO -Noted in transcranial Doppler study done as an outpatient on 1/19. -At the time it was not deemed big enough to be closed.  However patient seems to have active DVT, pulmonary lesion as well as embolic  stroke at this time which raises the strong significance of embolism through PFO. - cardiology consultation called. Dr. Burt Knack to see her.  May need TEE.  Recent pulmonary embolism/lower leg DVT  -Switched from Xarelto to Eliquis.  Essential  hypertension Blood pressure fluctuation -Patient states that her right arm blood pressure is always low and left arm blood pressure should be used as the measurement for medication adjustment. -Patient continues to have significant blood pressure fluctuation between the 2 arms: On arrival, blood pressure on the right arm was around 0000000 systolicand left arm was 99991111 systolic. -Significant blood pressure variation might have also caused her to have watershed stroke. -Continue to monitor blood pressure. -Home meds include Cardizem, Lasix, lisinopril.  Resume Cardizem at home dose.  I resumed Lasix and losartan at low-dose this morning.  CKD stage II -Stable.  Renal function at baseline.  COPD/chronic hypoxemic respiratory failure on 3 L home oxygen at night -Stable.  Continue home oxygen.  DuoNeb as needed.  Hypomagnesemia Magnesium 1.6 and received potassium supplementation in the ED. -Continue to monitor magnesium level.  Type 2 diabetes -A1c 9.4 on 1/8 -Only on Metformin at home.  -Blood sugar remain elevated episodes more than 200. -I will start the patient on Lantus 5 units daily from this morning.  Continue sliding scale with Accu-Cheks. -Consult diabetes coordinator -We will need optimization of therapy and close PCP follow-up  Mobility: PT eval appreciated.  CIR recommended. Diet: Cardiac/diabetic diet Fluid: Stop IV fluid DVT prophylaxis:  Eliquis Code Status:  Full code Family Communication:  Not at bedside Expected Discharge:  CIR after cardiology clearance for any signs of radiation.  Consultants:  Neurologist  Procedures:    Antimicrobials: Anti-infectives (From admission, onward)   None        Code Status: Full Code   Diet Order            Diet Carb Modified Fluid consistency: Thin; Room service appropriate? Yes  Diet effective now              Infusions:    Scheduled Meds: .  stroke: mapping our early stages of recovery book   Does not  apply Once  . apixaban  5 mg Oral BID  . atorvastatin  20 mg Oral QHS  . diltiazem  120 mg Oral Daily  . [START ON 09/14/2019] furosemide  20 mg Oral Daily  . insulin aspart  0-5 Units Subcutaneous QHS  . insulin aspart  0-9 Units Subcutaneous TID WC  . lidocaine  1 patch Transdermal Q24H  . [START ON 09/14/2019] losartan  50 mg Oral Daily  . rOPINIRole  2 mg Oral TID    PRN meds: acetaminophen **OR** acetaminophen (TYLENOL) oral liquid 160 mg/5 mL **OR** acetaminophen, diazepam, HYDROcodone-acetaminophen, ipratropium-albuterol, senna-docusate   Objective: Vitals:   09/13/19 0327 09/13/19 0739  BP: (!) 173/69 92/65  Pulse: 81 78  Resp: 18 20  Temp: 98 F (36.7 C) (!) 97.5 F (36.4 C)  SpO2: 93% 92%    Intake/Output Summary (Last 24 hours) at 09/13/2019 1121 Last data filed at 09/13/2019 0323 Gross per 24 hour  Intake 1059.23 ml  Output 1250 ml  Net -190.77 ml   Filed Weights   09/11/19 1427 09/12/19 1110  Weight: 87.5 kg 91 kg   Weight change: 3.456 kg Body mass index is 34.44 kg/m.   Physical Exam: General exam: Appears calm and comfortable.  Skin: No rashes, lesions or ulcers. HEENT: Atraumatic, normocephalic, supple neck, no obvious bleeding  Lungs: Clear to auscultation bilaterally, no crackles or wheezing. CVS: Regular rate and rhythm, no murmur GI/Abd soft, nondistended, nontender, bowel sounds CNS: Alert, awake and oriented x3. continues to have weakness in the left side.  2-3/5 left upper extremity proximal muscle strength, 3/5 left lower extremity strength.  Right side with good strength Psychiatry: Mood appropriate Extremities: No pedal edema no calf tenderness  Data Review: I have personally reviewed the laboratory data and studies available.  Recent Labs  Lab 09/07/19 1601 09/07/19 1623 09/07/19 1641 09/11/19 1556  WBC 11.7* 12.0*  --  9.9  NEUTROABS  --  7.7  --  5.2  HGB 12.7 12.8 13.3 12.3  HCT 40.5 39.8 39.0 38.2  MCV 90.0 89.0  --  88.6  PLT  383 340  --  315   Recent Labs  Lab 09/07/19 1601 09/07/19 1623 09/07/19 1641 09/11/19 1556 09/12/19 0853  NA 136 137 138 141  --   K 2.9* 3.3* 3.2* 3.8  --   CL 99 99 96* 106  --   CO2 24 25  --  25  --   GLUCOSE 151* 142* 142* 124*  --   BUN 18 17 19 12   --   CREATININE 0.96 0.95 0.90 0.79  --   CALCIUM 8.9 9.1  --  9.4  --   MG  --   --   --  1.6* 1.6*    Terrilee Croak, MD  [facility] 09/13/2019

## 2019-09-13 NOTE — Evaluation (Signed)
Speech Language Pathology Evaluation Patient Details Name: [name] [name] MRN: [id number] DOB: [birth year] Today's Date: 09/13/2019 Time: [id number] SLP Time Calculation (min) (ACUTE ONLY): 17 min  Problem List:  Patient Active Problem List   Diagnosis Date Noted  . Acute CVA (cerebrovascular accident) ([place]) 09/11/2019  . Subarachnoid hemorrhage ([place]) 09/11/2019  . DVT (deep venous thrombosis) ([place]) 09/11/2019  . Aneurysm, thoracic aortic ([place])   . Embolic stroke ([place])   . Pulmonary embolism ([place])   . Acute cerebrovascular accident (CVA) ([place]) 08/18/2019  . COPD (chronic obstructive pulmonary disease) ([place])   . COPD exacerbation ([place]) 08/12/2018  . Hyponatremia 08/12/2018  . Acute respiratory failure with hypoxia ([place]) 10/26/2015  . CAP (community acquired pneumonia) 10/26/2015  . Type 2 diabetes mellitus ([place]) 10/26/2015  . Overactive bladder 09/10/2015  . Diabetes mellitus type 2, controlled, without complications ([place])   . Depression   . Insomnia   . GERD (gastroesophageal reflux disease)   . Hypercholesteremia   . Hypertension   . Chronic kidney disease   . Overweight 10/29/2009  . GERD 06/20/2008  . CHEST PAIN 06/20/2008  . CHOLECYSTECTOMY, HX OF 06/20/2008  . HYPERLIPIDEMIA 12/11/2007  . TOBACCO ABUSE 12/11/2007  . DEPRESSION 12/11/2007  . Essential hypertension 12/11/2007  . Coronary atherosclerosis 12/11/2007  . DUODENITIS, WITH HEMORRHAGE 12/11/2007   Past Medical History:  Past Medical History:  Diagnosis Date  . Aneurysm, thoracic aortic ([place])   . Anxiety   . Arthritis   . CAD (coronary artery disease)   . Chronic kidney disease    Renal insufficiency  . COPD (chronic obstructive pulmonary disease) ([place])   . Depression   . Diabetes mellitus   . Diverticulosis of colon (without mention of hemorrhage)   . Duodenitis without mention of hemorrhage   . Embolic stroke ([place])   . GERD (gastroesophageal reflux disease)   . Hemorrhoids   . Hypercholesteremia    . Hypertension   . Insomnia   . Pulmonary embolism ([place])   . Tinnitus   . Vertigo    Past Surgical History:  Past Surgical History:  Procedure Laterality Date  . ANGIOPLASTY    . CHOLECYSTECTOMY    . ROTATOR CUFF REPAIR Right 04/2012  . TUBAL LIGATION     HPI:  [name] [name] is a 69 y.o. female with medical history significant of CAD, CKD stage II, COPD/chronic hypoxemic respiratory failure on 3 L home oxygen at night, type 2 diabetes, hypertension, hyperlipidemia, thoracic aortic aneurysm, and recently admitted 1/7-1/9 for acute CVA with MRI showing watershed infarcts in bilateral hemispheres.    Seen in ED 1/27 for new weakness, but negative work up so d/c.  Admitted 1/31 with L side weakness; MRI brain done showing continued interval evolution of previously seen subacute infarcts with multiple new scattered acute ischemic watershed type infarcts involving the bilateral cerebral hemispheres, right greater than left.  Associated petechial hemorrhage about a few of these infarcts without frank hemorrhagic transformation.  Small volume subarachnoid hemorrhage layering along the cortical sulcus within the right frontal lobe.   Assessment / Plan / Recommendation Clinical Impression   Patient received at bedside for cognitive/language evaluation. Receptive and expressive language skills are [facility]. No dysarthria or cognitive-communication deficits were identified during today's assessment. Patient is alert, oriented x4. Patient with appropriate affect and insight into situation. No facial asymmetry noted. Patient's speech is fluent, no articulatory imprecision or distortions observed. She is able to answer basic and complex yes/no questions, follow multi-step verbal commands. Automatic speech  and repetition are intact. Patient able to name items in confrontation naming task without difficulty and denies word-finding problems. Reading informally assessed at the sentence level and found to be intact.  Patient given portions of the COGNISTAT to assist with assessment of cognitive-linguistic skills. All scores obtained were [facility] (see below for details). COGNISTAT Attention: 8/8 [facility] Repetition: 12/12 [facility] Naming: 8/8 WFL Memory: 11/12 WFL Calculations: 3/4 WFL Reasoning, similarities: 7/8 WFL Reasoning, judgement: 6/6 WFL  No further speech therapy services are warranted at this time.   Please re-consult ST should new needs arise. Thank you.     SLP Assessment  SLP Recommendation/Assessment: Patient does not need any further Speech Lanaguage Pathology Services SLP Visit Diagnosis: Aphasia (R47.01)    Follow Up Recommendations  None    Frequency and Duration           SLP Evaluation Cognition  Overall Cognitive Status: Within Functional Limits for tasks assessed Arousal/Alertness: Awake/alert Orientation Level: Oriented X4 Problem Solving: Appears intact Safety/Judgment: Appears intact       Comprehension  Auditory Comprehension Overall Auditory Comprehension: Appears within functional limits for tasks assessed Yes/No Questions: Within Functional Limits Commands: Within Functional Limits Reading Comprehension Reading Status: Within funtional limits    Expression Expression Primary Mode of Expression: Verbal Verbal Expression Overall Verbal Expression: Appears within functional limits for tasks assessed Initiation: No impairment Automatic Speech: Counting Level of Generative/Spontaneous Verbalization: Conversation Repetition: No impairment Naming: No impairment Non-Verbal Means of Communication: Not applicable Written Expression Dominant Hand: Right Written Expression: Not tested   Oral / Motor  Oral Motor/Sensory Function Overall Oral Motor/Sensory Function: Within functional limits Motor Speech Overall Motor Speech: Appears within functional limits for tasks assessed Intelligibility: Intelligible   [place], M.Ed., CCC-SLP Speech  Therapy Acute Rehabilitation [phone]: Acute Rehab office [phone] - pager   Jaleisa Brose 09/13/2019, 10:45 AM

## 2019-09-13 NOTE — Consult Note (Signed)
Physical Medicine and Rehabilitation Consult Reason for Consult: Left side weakness Referring Physician: [facility]   HPI: [name] [name] is a 69 y.o. right-handed female with history of CAD, CKD stage II, COPD with chronic hypoxemic respiratory failure on 3 L oxygen at home at night, type 2 diabetes mellitus, recent shoulder fracture nonsurgical managed with a sling, hypertension, hyperlipidemia, thoracic aortic aneurysm, patient quit smoking 12 months ago .  Per chart review patient lives alone.  1 level home with ramped entrance.  Used a walker occasionally prior to admission.  Her son did help with some basic ADLs.  Patient had been admitted 08/18/2019 to 08/20/2019 for acute CVA with MRI showing watershed infarcts in bilateral hemispheres.  CT angiogram head and neck revealed 50% stenosis of the proximal right internal carotid artery due to bulky atherosclerotic calcification.  Carotid Dopplers with no ICA stenosis.  Echocardiogram normal EF no acute findings.  Patient was discharged home on aspirin and Plavix x3 months then Plavix alone.  On 08/23/2019 patient post hospital evaluation by PCP noted to have shortness of breath CT angio of chest showed acute bilateral segmental subsegmental pulmonary bleeding.  Subsequent DVT scan showed right lower extremity DVT.  Patient was started on Xarelto replacing aspirin and Plavix.  08/30/2019 bubble study done by outpatient neurology showed a small PFO but it was not felt large enough to allow for emboli the past from right to left side of heart.  Patient presented to the ED 09/07/2019 for left-sided weakness CT of head as well as MRI of the brain showed evolution of old strokes with no new infarcts and hence patient was subsequently discharged to home.  Presented 09/11/2019 with increasing left-sided weakness as well as hypotension.  Patient did receive 500 mL fluid bolus.  MRI and imaging revealed continued interval evolution of previously seen subacute infarcts,  with multiple new scattered acute ischemic watershed type infarctions involving the bilateral cerebral hemispheres, right greater than left.  Associated petechial hemorrhage about a few of these infarcts without frank hemorrhagic transformation.  Small volume subarachnoid hemorrhage layering along the cortical sulcus within the right frontal lobe, new from previous tracing.  Neurology has been consulted for work-up plan hypercoagulable work-up study.  Patient Xarelto has been changed to Eliquis given new infarcts.  Cardiology services consulted in regards to PFO question need for closure and await input.  Tolerating a regular consistency diet.  Therapy evaluations completed with recommendations of physical medicine rehab consult.   Review of Systems  Constitutional: Negative for chills and fever.  HENT: Positive for tinnitus.   Eyes: Negative for blurred vision and double vision.  Respiratory: Positive for shortness of breath.   Cardiovascular: Positive for leg swelling. Negative for chest pain and palpitations.  Gastrointestinal: Positive for constipation. Negative for heartburn and nausea.       GERD  Genitourinary: Negative for dysuria, flank pain and hematuria.  Musculoskeletal: Positive for joint pain and myalgias.  Skin: Negative for rash.  Neurological: Positive for dizziness and weakness.  Psychiatric/Behavioral: Positive for depression. The patient has insomnia.        Anxiety  All other systems reviewed and are negative.  Past Medical History:  Diagnosis Date  . Aneurysm, thoracic aortic ([place])   . Anxiety   . Arthritis   . CAD (coronary artery disease)   . Chronic kidney disease    Renal insufficiency  . COPD (chronic obstructive pulmonary disease) ([place])   . Depression   . Diabetes mellitus   .  Diverticulosis of colon (without mention of hemorrhage)   . Duodenitis without mention of hemorrhage   . Embolic stroke ([place])   . GERD (gastroesophageal reflux disease)   .  Hemorrhoids   . Hypercholesteremia   . Hypertension   . Insomnia   . Pulmonary embolism ([place])   . Tinnitus   . Vertigo    Past Surgical History:  Procedure Laterality Date  . ANGIOPLASTY    . CHOLECYSTECTOMY    . ROTATOR CUFF REPAIR Right 04/2012  . TUBAL LIGATION     Family History  Problem Relation Age of Onset  . Diabetes Mother   . Hemophilia Mother   . Breast cancer Sister   . Heart disease Father   . Colon polyps Father   . Diabetes Brother   . Colon polyps Sister    Social History:  reports that she quit smoking about 13 months ago. Her smoking use included cigarettes. She has never used smokeless tobacco. She reports that she does not drink alcohol or use drugs. Allergies:  Allergies  Allergen Reactions  . Guaifenesin & Derivatives Anaphylaxis    Severe Reaction  . Tape Other (See Comments)    TAPE PEELS OFF THE SKIN!!!!!!!  . Benazepril Cough  . Morphine Nausea Only  . Zyrtec [Cetirizine] Swelling and Other (See Comments)    Hands and feet became swollen  . Latex Rash and Other (See Comments)    No powdered gloves!!   Medications Prior to Admission  Medication Sig Dispense Refill  . rivaroxaban (XARELTO) 20 MG TABS tablet Take 1 tablet (20 mg total) by mouth daily with supper. 30 tablet 2  . albuterol (VENTOLIN HFA) 108 (90 Base) MCG/ACT inhaler Inhale 2 puffs into the lungs every 4 (four) hours as needed for wheezing or shortness of breath. 54 g 3  . atorvastatin (LIPITOR) 20 MG tablet Take 1 tablet (20 mg total) by mouth daily. (Patient taking differently: Take 20 mg by mouth at bedtime. ) 90 tablet 3  . bimatoprost (LUMIGAN) 0.01 % SOLN Place 1 drop into both eyes at bedtime.    . brinzolamide (AZOPT) 1 % ophthalmic suspension Place 1 drop into both eyes every 12 (twelve) hours.     . clopidogrel (PLAVIX) 75 MG tablet Take 1 tablet (75 mg total) by mouth daily. (Patient not taking: Reported on 09/07/2019) 30 tablet 1  . Continuous Blood Gluc Sensor  (FREESTYLE LIBRE 14 DAY SENSOR) MISC CHANGE EVERY 14 DAYS (Patient taking differently: Inject 1 patch into the skin every 14 (fourteen) days. ) 2 each 3  . dexlansoprazole (DEXILANT) 60 MG capsule Take 1 capsule (60 mg total) by mouth daily. 90 capsule 3  . diazepam (VALIUM) 5 MG tablet Take 1 tablet (5 mg total) by mouth every 12 (twelve) hours as needed for anxiety (insomnia). (Patient taking differently: Take 5 mg by mouth every 12 (twelve) hours as needed (anxiety or insomnia). ) 60 tablet 1  . diltiazem (CARDIZEM) 120 MG tablet Take 120 mg by mouth daily.    . ergocalciferol (VITAMIN D2) 1.25 MG (50000 UT) capsule Take 1 capsule (50,000 Units total) by mouth once a week. (Patient taking differently: Take 50,000 Units by mouth every Tuesday. ) 24 capsule 0  . escitalopram (LEXAPRO) 10 MG tablet Take 1 tablet (10 mg total) by mouth daily. 90 tablet 3  . furosemide (LASIX) 40 MG tablet Take 1 tablet (40 mg total) by mouth daily. 30 tablet 3  . HYDROcodone-acetaminophen (NORCO) 10-325 MG per tablet Take  1 tablet by mouth every 6 (six) hours as needed for moderate pain.     Marland Kitchen ipratropium-albuterol (DUONEB) 0.5-2.5 (3) MG/3ML SOLN INHALE 1 VIAL VIA NEBULIZER EVERY 6 HOURS AS NEEDED (Patient taking differently: Take 3 mLs by nebulization every 6 (six) hours as needed (as directed). ) 360 mL 0  . losartan (COZAAR) 100 MG tablet Take 1 tablet (100 mg total) by mouth daily. 90 tablet 3  . metFORMIN (GLUCOPHAGE) 1000 MG tablet Take 1 tablet (1,000 mg total) by mouth 2 (two) times daily with a meal. 60 tablet 1  . OXYGEN Inhale 2 L into the lungs at bedtime.     . potassium chloride SA (KLOR-CON) 20 MEQ tablet Take 1 tablet (20 mEq total) by mouth daily. 5 tablet 0  . rOPINIRole (REQUIP) 2 MG tablet Take 1 tablet (2 mg total) by mouth 3 (three) times daily. (Patient taking differently: Take 2 mg by mouth at bedtime. ) 270 tablet 3  . Tiotropium Bromide-Olodaterol 2.5-2.5 MCG/ACT AERS Inhale 2 puffs into the  lungs daily. (Patient taking differently: Inhale 1 puff into the lungs daily. ) 12 g 3    Home: Home Living Family/patient expects to be discharged to:: Private residence Living Arrangements: Alone Available Help at Discharge: Family(grandaughter helps during the day and sister in law helps when she can) Type of Home: House Home Access: Ramped entrance(w/c lift) Home Layout: One level Bathroom Shower/Tub: Multimedia programmer: Handicapped height Home Equipment: Environmental consultant - 2 wheels, Wheelchair - manual, Shower seat, Grab bars - toilet, Grab bars - tub/shower  Functional History: Prior Function Level of Independence: Needs assistance Gait / Transfers Assistance Needed: using walker occasionally, but got worse over day and half pta ADL's / Homemaking Assistance Needed: son helped with shower Functional Status:  Mobility: Bed Mobility Overal bed mobility: Needs Assistance Bed Mobility: Supine to Sit Supine to sit: HOB elevated Sit to supine: Min assist General bed mobility comments: cues to use rail, assist for trunk, increased time scooting to EOB Transfers Overall transfer level: Needs assistance Equipment used: 1 person hand held assist, Rolling walker (2 wheeled) Transfers: Sit to/from Stand, [employer] Transfers Sit to Stand: Min assist Stand pivot transfers: Min assist General transfer comment: assist for balance/safety, first HHA on L and using footboard on R, then with RW, then stood to pivot to recliner Ambulation/Gait Ambulation/Gait assistance: Herbalist (Feet): 8 Feet Assistive device: Rolling walker (2 wheeled) Gait Pattern/deviations: Step-to pattern, Decreased stride length, Decreased dorsiflexion - left, Scissoring General Gait Details: needing to look at L foot to make sure foot clears then cues for preventing crossing over with steps, stayed at bedside with RW    ADL:    Cognition: Cognition Overall Cognitive Status: Within  Functional Limits for tasks assessed Orientation Level: Oriented X4 Cognition Arousal/Alertness: Awake/alert Behavior During Therapy: WFL for tasks assessed/performed Overall Cognitive Status: Within Functional Limits for tasks assessed  Blood pressure (!) 173/69, pulse 81, temperature 98 F (36.7 C), temperature source Oral, resp. rate 18, height 5\' 4"  (1.626 m), weight 91 kg, SpO2 93 %.  Physical Exam General: Alert and oriented x 3, No apparent distress. Appears fatigued.  HEENT: Head is normocephalic, atraumatic, PERRLA, EOMI, sclera anicteric, oral mucosa pink and moist, dentition intact, ext ear canals clear,  Neck: Supple without JVD or lymphadenopathy Heart: Reg rate and rhythm. No murmurs rubs or gallops Chest: CTA bilaterally without wheezes, rales, or rhonchi; no distress Abdomen: Soft, non-tender, non-distended, bowel sounds positive. Extremities:  No clubbing, cyanosis, or edema. Pulses are 2+ Skin: Clean and intact without signs of breakdown Neurological: Patient is alert in no acute distress.  Makes eye contact with examiner.  Follows simple commands.  Provides her name and age however she does exhibit some decrease in safety awareness. MSK: Puts forth excellent effort. 5/5 strength on right side. LUE with 3/5 SA, EF, EE, 5/5 WE, 5/5 hand grip. LLE with 4-/5 HF, KE, 0/5 DF.  Psych: Pt's affect is appropriate. Pt is cooperative      Results for orders placed or performed during the hospital encounter of 09/11/19 (from the past 24 hour(s))  CBG monitoring, ED     Status: Abnormal   Collection Time: 09/12/19  7:35 AM  Result Value Ref Range   Glucose-Capillary 161 (H) 70 - 99 mg/dL  Magnesium     Status: Abnormal   Collection Time: 09/12/19  8:53 AM  Result Value Ref Range   Magnesium 1.6 (L) 1.7 - 2.4 mg/dL  Glucose, capillary     Status: Abnormal   Collection Time: 09/12/19 12:01 PM  Result Value Ref Range   Glucose-Capillary 146 (H) 70 - 99 mg/dL  Glucose,  capillary     Status: Abnormal   Collection Time: 09/12/19  4:47 PM  Result Value Ref Range   Glucose-Capillary 151 (H) 70 - 99 mg/dL  Glucose, capillary     Status: Abnormal   Collection Time: 09/12/19  9:23 PM  Result Value Ref Range   Glucose-Capillary 226 (H) 70 - 99 mg/dL   MR BRAIN WO CONTRAST  Result Date: 09/11/2019 CLINICAL DATA:  Initial evaluation for acute difficulty walking, stroke suspected. EXAM: MRI HEAD WITHOUT CONTRAST TECHNIQUE: Multiplanar, multiecho pulse sequences of the brain and surrounding structures were obtained without intravenous contrast. COMPARISON:  Comparison made with multiple recent brain MRIs, most recent of which from 09/07/2019. FINDINGS: Brain: Generalized age-related cerebral atrophy with chronic microvascular ischemic disease again noted. Multiple remote lacunar infarcts present within the hemispheric cerebral white matter. Continued interval evolution of previously seen subacute watershed type infarcts involving the bilateral cerebral hemispheres. However, multiple new watershed type infarcts are now seen on today's exam, increased in size and number but with similar distribution as compared to previous. Overall, involvement is slightly worse within the right cerebral hemisphere as compared to the left. New patchy involvement of the bilateral parieto-occipital regions as well, also greater on the right (series 5, image 66). Now seen are a few scattered foci of susceptibility artifact associated with these infarcts, consistent with associated petechial hemorrhage (series 14, image 46). Additionally, there is a serpiginous focus of FLAIR and susceptibility artifact seen layering along a cortical sulcus within the right frontal lobe, concerning for a small volume subarachnoid hemorrhage, new from previous (series 14, image 46). No frank intraparenchymal hematoma. No significant mass effect. No mass lesion or midline shift. No hydrocephalus. No extra-axial fluid  collection. Pituitary gland suprasellar region normal. Midline structures intact. Vascular: Major intracranial vascular flow voids are maintained. Skull and upper cervical spine: Craniocervical junction normal. Upper cervical spine within normal limits. Bone marrow signal intensity normal. No scalp soft tissue abnormality. Sinuses/Orbits: Globes and orbital soft tissues within normal limits. Paranasal sinuses remain largely clear. No significant mastoid effusion. Other: None. IMPRESSION: 1. Continued interval evolution of previously seen subacute infarcts, with multiple new scattered acute ischemic watershed type infarcts involving the bilateral cerebral hemispheres, right greater than left. Associated petechial hemorrhage about a few of these infarcts without frank hemorrhagic transformation. 2.  Small volume subarachnoid hemorrhage layering along the cortical sulcus within the right frontal lobe, new from previous. 3. Otherwise stable exam with underlying cerebral atrophy, chronic microvascular ischemic disease, and multiple remote lacunar infarcts involving the hemispheric cerebral white matter. Critical Value/emergent results were called by telephone at the time of interpretation on 09/11/2019 at 8:43 pm to provider [facility] , who verbally acknowledged these results. Electronically Signed   By: Jeannine Boga M.D.   On: 09/11/2019 20:43     Assessment/Plan: Diagnosis: Diffuse watershed infarcts with interval evolution, right>left 1. Does the need for close, 24 hr/day medical supervision in concert with the patient's rehab needs make it unreasonable for this patient to be served in a less intensive setting? Yes 2. Co-Morbidities requiring supervision/potential complications: CAD< CKD stage II, type 2 diabetes, HTN, HLD, thoracic aortic aneurysm, COPD/chronic hypoxemic respiratory failure on 3L home O2.  3. Due to safety, skin/wound care, disease management, medication administration, pain  management and patient education, does the patient require 24 hr/day rehab nursing? Yes 4. Does the patient require coordinated care of a physician, rehab nurse, therapy disciplines of PT, OT to address physical and functional deficits in the context of the above medical diagnosis(es)? Yes Addressing deficits in the following areas: balance, endurance, locomotion, strength, transferring, bowel/bladder control, bathing, dressing, feeding, grooming, toileting and psychosocial support 5. Can the patient actively participate in an intensive therapy program of at least 3 hrs of therapy per day at least 5 days per week? Yes 6. The potential for patient to make measurable gains while on inpatient rehab is excellent 7. Anticipated functional outcomes upon discharge from inpatient rehab are modified independent  with PT, modified independent with OT, independent with SLP. 8. Estimated rehab length of stay to reach the above functional goals is: 10-12 days 9. Anticipated discharge destination: Home 10. Overall Rehab/Functional Prognosis: excellent  RECOMMENDATIONS: This patient's condition is appropriate for continued rehabilitative care in the following setting: CIR Patient has agreed to participate in recommended program. Yes Note that insurance prior authorization may be required for reimbursement for recommended care.  Comment:  Disposition: Patient would be an excellent CIR candidate! Her 80 year old granddaughter lives with her and can provide assistance during the day. Her son lives opposite her and can take time off work to help her as per patient.  Left shoulder pain: chronic, and likely worsened by current hemiparesis. Please consider applying lidocaine patch for relief. Patient says this has helped her in the past. Low back pain: Positional. Encourage OOB as much as tolerated. Please provide Kpad for relief.  LLE impaired dorsiflexion: PRAFO to prevent plantarflexion contracture. Moving bowels  regularly, sleeping well at night.   Lavon Paganini Angiulli, PA-C 09/13/2019   AC, PT, hospitalist, neurology notes reviewed. MRI brain personally reviewed and shows diffuse watershed infarcts with interval evolution, right>left. Discussed with OT at bedside. I have personally performed a face to face diagnostic evaluation, including, but not limited to relevant history and physical exam findings, of this patient and developed relevant assessment and plan.  Additionally, I have reviewed and concur with the physician assistant's documentation above.  Leeroy Cha, MD

## 2019-09-13 NOTE — Consult Note (Addendum)
[facility]   MULTIDISCIPLINARY HEART VALVE TEAM  Inpatient PFO Consultation:   Patient ID: [name] [name]; [id number]; 1951/05/01   Admit date: 09/11/2019 Date of Consult: 09/13/2019  Primary Care Provider: Susy Frizzle, MD Primary Cardiologist: has seen Dr. Percival Spanish remotely   Patient Profile:   [name] [name] is a 69 y.o. female with a hx of CAD, CKD stage II, longstanding tobacco abuse (recently quit), COPD with chronic hypoxemic respiratory failure on 3 L oxygen at home at night, type 2 diabetes mellitus, recent shoulder fracture nonsurgical managed with a sling, hypertension, hyperlipidemia, thoracic aortic aneurysm, DVT/PE, recurrent CVAs and possible PFO who is being seen today for the evaluation of PFO closure at the request of Dr. Leonie [name].  History of Present Illness:   [name] lives independently in [place], [place]. She has one son that lives across the street from her and they are very close.   Patient was admitted 1/7-08/20/2019 for acute CVA.  MRI showed watershed infarcts in bilateral hemispheres.  CT angio head and neck revealed 50% stenosis of the proximal R ICA due to bulky atherosclerotic calcification.  Carotid Dopplers showed 1-39% ICA stenosis.  Echocardiogram showed normal EF with no acute findings.  She was discharged on dual antiplatelet therapy with aspirin and Plavix for 3 months and then Plavix alone.  She was seen by her PCP on 08/23/2019 for post hospital evaluation.  She had worsening shortness of breath and CT angio of the chest showed acute bilateral segmental and subsegmental pulmonary embolism, stable dilated main pulmonary artery, suggesting chronic pulmonary arterial hypertension and stable ectatic 4.0 cm ascending thoracic aorta as well as three-vessel coronary atherosclerosis.  Subsequent lower extremity duplex showed right lower extremity DVT.  Patient was started on Xarelto.  Outpatient transcranial Doppler showed a weakly positive  bubble study that was felt to be clinically insignificant.  Patient then presented to the emergency department on 09/07/2019 for left-sided weakness.  CT head as well as MRI of the brain showed evolution of old strokes with no new infarcts and the patient was subsequently discharged home.  She represented to the emergency department on 09/11/2019 with increasing left-sided weakness as well as hypotension.  She received a fluid bolus.  MRI revealed continued interval evolution of previously seen subacute infarct with new scattered acute ischemic watershed type infarctions involving the bilateral cerebral hemisphere, right greater than left.  There was associated petechial hemorrhage without frank hemorrhagic transformation.  There was a small volume subarachnoid hemorrhage layering across the cortical sulcus within the right frontal lobe, new from previous tracings. Xarelto switched to Eliquis.  Hypercoagulable panel is in process.  Given patient's recurrent CVAs and failure of anticoagulation, the structural heart team is consulted for evaluation of PFO and potential for transcatheter closure.  She is currently sitting up in bed. No CP or SOB. No LE edema, orthopnea or PND. No dizziness or syncope. No blood in stool or urine. No palpitations.  Still has residual left sided weakness, mostly in her leg.     Past Medical History:  Diagnosis Date  . Aneurysm, thoracic aortic ([place])   . Anxiety   . Arthritis   . CAD (coronary artery disease)   . Chronic kidney disease    Renal insufficiency  . COPD (chronic obstructive pulmonary disease) ([place])   . Depression   . Diabetes mellitus   . Diverticulosis of colon (without mention of hemorrhage)   . Duodenitis without mention of hemorrhage   . Embolic stroke (  [facility])   . GERD (gastroesophageal reflux disease)   . Hemorrhoids   . Hypercholesteremia   . Hypertension   . Insomnia   . Pulmonary embolism ([place])   . Tinnitus   . Vertigo     Past Surgical  History:  Procedure Laterality Date  . ANGIOPLASTY    . CHOLECYSTECTOMY    . ROTATOR CUFF REPAIR Right 04/2012  . TUBAL LIGATION       Inpatient Medications: Scheduled Meds: .  stroke: mapping our early stages of recovery book   Does not apply Once  . apixaban  5 mg Oral BID  . atorvastatin  20 mg Oral QHS  . diltiazem  120 mg Oral Daily  . furosemide  40 mg Oral Daily  . insulin aspart  0-5 Units Subcutaneous QHS  . insulin aspart  0-9 Units Subcutaneous TID WC  . losartan  100 mg Oral Daily  . rOPINIRole  2 mg Oral TID   Continuous Infusions:  PRN Meds: acetaminophen **OR** acetaminophen (TYLENOL) oral liquid 160 mg/5 mL **OR** acetaminophen, diazepam, HYDROcodone-acetaminophen, ipratropium-albuterol, senna-docusate  Allergies:    Allergies  Allergen Reactions  . Guaifenesin & Derivatives Anaphylaxis    Severe Reaction  . Tape Other (See Comments)    TAPE PEELS OFF THE SKIN!!!!!!!  . Benazepril Cough  . Morphine Nausea Only  . Zyrtec [Cetirizine] Swelling and Other (See Comments)    Hands and feet became swollen  . Latex Rash and Other (See Comments)    No powdered gloves!!    Social History:   Social History   Socioeconomic History  . Marital status: Widowed    Spouse name: Not on file  . Number of children: 1  . Years of education: Not on file  . Highest education level: Not on file  Occupational History  . Occupation: Retired  Tobacco Use  . Smoking status: Former Smoker    Types: Cigarettes    Quit date: 08/12/2018    Years since quitting: 1.0  . Smokeless tobacco: Never Used  Substance and Sexual Activity  . Alcohol use: No    Alcohol/week: 0.0 standard drinks  . Drug use: No  . Sexual activity: Not on file  Other Topics Concern  . Not on file  Social History Narrative  . Not on file   Social Determinants of Health   Financial Resource Strain:   . Difficulty of Paying Living Expenses: Not on file  Food Insecurity:   . Worried About Paediatric nurse in the Last Year: Not on file  . Ran Out of Food in the Last Year: Not on file  Transportation Needs: No Transportation Needs  . Lack of Transportation (Medical): No  . Lack of Transportation (Non-Medical): No  Physical Activity:   . Days of Exercise per Week: Not on file  . Minutes of Exercise per Session: Not on file  Stress:   . Feeling of Stress : Not on file  Social Connections:   . Frequency of Communication with Friends and Family: Not on file  . Frequency of Social Gatherings with Friends and Family: Not on file  . Attends Religious Services: Not on file  . Active Member of Clubs or Organizations: Not on file  . Attends Archivist Meetings: Not on file  . Marital Status: Not on file  Intimate Partner Violence:   . Fear of Current or Ex-Partner: Not on file  . Emotionally Abused: Not on file  . Physically Abused: Not on file  .  Sexually Abused: Not on file    Family History:   The patient's family history includes Breast cancer in her sister; Colon polyps in her father and sister; Diabetes in her brother and mother; Heart disease in her father; Hemophilia in her mother.  ROS:  Please see the history of present illness.  ROS  All other ROS reviewed and negative.    Physical Exam/Data:   Vitals:   09/12/19 2045 09/13/19 0024 09/13/19 0327 09/13/19 0739  BP: (!) 147/51 (!) 180/73 (!) 173/69 92/65  Pulse:  84 81 78  Resp: 18 18 18 20   Temp: 97.8 F (36.6 C) (!) 97.5 F (36.4 C) 98 F (36.7 C) (!) 97.5 F (36.4 C)  TempSrc: Oral Oral Oral Oral  SpO2:  94% 93% 92%  Weight:      Height:        Intake/Output Summary (Last 24 hours) at 09/13/2019 [id number] Last data filed at 09/13/2019 0323 Gross per 24 hour  Intake 1059.23 ml  Output 1250 ml  Net -190.77 ml   Filed Weights   09/11/19 1427 09/12/19 1110  Weight: 87.5 kg 91 kg   Body mass index is 34.44 kg/m.  General:  Well nourished, well developed, in no acute distress HEENT: normal Lymph:  no adenopathy Neck: no JVD Endocrine:  No thryoid Cardiac:  normal S1, S2; RRR; no murmur  Lungs:  clear to auscultation bilaterally, no wheezing, rhonchi or rales  Abd: soft, nontender, no hepatomegaly  Ext: no edema Musculoskeletal:  No deformities, BUE and BLE strength normal and equal Skin: warm and dry  Neuro:  CNs 2-12 intact. Left sided weakness, mostly in left lower extremity Psych:  Normal affect   EKG:  The EKG was personally reviewed and demonstrates: sinus, HR 92. Septal Q waves Telemetry:  Telemetry was personally reviewed and demonstrates: sinus with no afib  Relevant CV Studies: Echo 08/19/19 IMPRESSIONS 1. Left ventricular ejection fraction, by visual estimation, is 60 to 65%. The left ventricle has normal function. There is mildly increased  left ventricular hypertrophy.  2. Global right ventricle has normal systolic function.The right ventricular size is normal.  3. Left atrial size was normal.  4. Right atrial size was normal.  5. Mild mitral annular calcification.  6. The mitral valve is abnormal. Trivial mitral valve regurgitation.  7. The tricuspid valve is normal in structure.  8. The aortic valve was not well visualized. Aortic valve regurgitation  is not visualized. No evidence of aortic valve stenosis.  9. The pulmonic valve was not well visualized. Pulmonic valve  regurgitation is not visualized.  10. Trivial pericardial effusion  11. TR signal is inadequate for assessing pulmonary artery systolic  pressure.  12. The inferior vena cava is normal in size with greater than 50% respiratory variability, suggesting right atrial pressure of 3 mmHg.  __________________  Transcranial doppler: 08/30/19 Summary:   A vascular evaluation was performed. The right middle cerebral artery was  studied. An IV was inserted into the patient's right hand . Verbal  informed consent was obtained.  HITS heard at rest: 1-2 Clinically insignificant.  Weakly positive  TCD Bubble study indiicative of a small likely clinically insignificant right to left shunt   __________________  Carotid dopplers: 08/19/19 Summary:  Right Carotid: Velocities in the right ICA are consistent with a 1-39% stenosis.  Left Carotid: Velocities in the left ICA are consistent with a 1-39% stenosis. Diminished proximal CCA velocities, along with retrograde  ECA is suggestive of possible proximal stenosis.  Vertebrals: Left vertebral artery demonstrates antegrade flow. Right  vertebral artery demonstrates retrograde flow. This, along with atypical right subclavian artery velocity, and atherosclerosis of the  aortic  arch and proximal branches seen on CTA, is consistent with  possibe proximal stenosis.  Subclavians: Right subclavian artery was abnormal. Normal flow hemodynamics were  seen in the left subclavian artery.    Laboratory Data:  Chemistry Recent Labs  Lab 09/07/19 1601 09/07/19 1601 09/07/19 1623 09/07/19 1641 09/11/19 1556  NA 136   < > 137 138 141  K 2.9*   < > 3.3* 3.2* 3.8  CL 99   < > 99 96* 106  CO2 24  --  25  --  25  GLUCOSE 151*   < > 142* 142* 124*  BUN 18   < > 17 19 12   CREATININE 0.96   < > 0.95 0.90 0.79  CALCIUM 8.9  --  9.1  --  9.4  GFRNONAA 60*  --  >60  --  >60  GFRAA >60  --  >60  --  >60  ANIONGAP 13  --  13  --  10   < > = values in this interval not displayed.    Recent Labs  Lab 09/07/19 1623 09/11/19 1556  PROT 6.9 6.7  ALBUMIN 3.5 3.3*  AST 28 28  ALT 30 30  ALKPHOS 49 49  BILITOT 0.2* 0.2*   Hematology Recent Labs  Lab 09/07/19 1601 09/07/19 1601 09/07/19 1623 09/07/19 1641 09/11/19 1556  WBC 11.7*  --  12.0*  --  9.9  RBC 4.50  --  4.47  --  4.31  HGB 12.7   < > 12.8 13.3 12.3  HCT 40.5   < > 39.8 39.0 38.2  MCV 90.0  --  89.0  --  88.6  MCH 28.2  --  28.6  --  28.5  MCHC 31.4  --  32.2  --  32.2  RDW 15.6*  --  15.4  --  15.3  PLT 383  --  340  --  315   < > = values in this interval not displayed.     Cardiac EnzymesNo results for input(s): TROPONINI in the last 168 hours. No results for input(s): TROPIPOC in the last 168 hours.  BNPNo results for input(s): BNP, PROBNP in the last 168 hours.  DDimer No results for input(s): DDIMER in the last 168 hours.  Radiology/Studies:  MR BRAIN WO CONTRAST  Result Date: 09/11/2019 CLINICAL DATA:  Initial evaluation for acute difficulty walking, stroke suspected. EXAM: MRI HEAD WITHOUT CONTRAST TECHNIQUE: Multiplanar, multiecho pulse sequences of the brain and surrounding structures were obtained without intravenous contrast. COMPARISON:  Comparison made with multiple recent brain MRIs, most recent of which from 09/07/2019. FINDINGS: Brain: Generalized age-related cerebral atrophy with chronic microvascular ischemic disease again noted. Multiple remote lacunar infarcts present within the hemispheric cerebral white matter. Continued interval evolution of previously seen subacute watershed type infarcts involving the bilateral cerebral hemispheres. However, multiple new watershed type infarcts are now seen on today's exam, increased in size and number but with similar distribution as compared to previous. Overall, involvement is slightly worse within the right cerebral hemisphere as compared to the left. New patchy involvement of the bilateral parieto-occipital regions as well, also greater on the right (series 5, image 66). Now seen are a few scattered foci of susceptibility artifact associated with these infarcts, consistent with associated petechial hemorrhage (series 14, image 46). Additionally, there is a  serpiginous focus of FLAIR and susceptibility artifact seen layering along a cortical sulcus within the right frontal lobe, concerning for a small volume subarachnoid hemorrhage, new from previous (series 14, image 46). No frank intraparenchymal hematoma. No significant mass effect. No mass lesion or midline shift. No hydrocephalus. No extra-axial fluid  collection. Pituitary gland suprasellar region normal. Midline structures intact. Vascular: Major intracranial vascular flow voids are maintained. Skull and upper cervical spine: Craniocervical junction normal. Upper cervical spine within normal limits. Bone marrow signal intensity normal. No scalp soft tissue abnormality. Sinuses/Orbits: Globes and orbital soft tissues within normal limits. Paranasal sinuses remain largely clear. No significant mastoid effusion. Other: None. IMPRESSION: 1. Continued interval evolution of previously seen subacute infarcts, with multiple new scattered acute ischemic watershed type infarcts involving the bilateral cerebral hemispheres, right greater than left. Associated petechial hemorrhage about a few of these infarcts without frank hemorrhagic transformation. 2. Small volume subarachnoid hemorrhage layering along the cortical sulcus within the right frontal lobe, new from previous. 3. Otherwise stable exam with underlying cerebral atrophy, chronic microvascular ischemic disease, and multiple remote lacunar infarcts involving the hemispheric cerebral white matter. Critical Value/emergent results were called by telephone at the time of interpretation on 09/11/2019 at 8:43 pm to provider [facility] , who verbally acknowledged these results. Electronically Signed   By: Jeannine Boga M.D.   On: 09/11/2019 20:43    Assessment and Plan:    Risk of Paradoxical Embolism (RoPE) score No history of HTN      =    1 No history of DM 2     =    1 No h/o CVA/TIA         =    1 Nonsmoker                =    1 Cortical infarct on imaging  =    1 Age, years 30 to 50   =    5 25 to 25   =    4 40 to 56   =    3 50 to 63   =    2 60 to 28   =    1 > 70    =    0  RoPE score is total sum of individual points (higher total is LESS risk of stroke)   RoPE score   Estimated 2 year stroke/TIA recurrence rate (Kaplan-Meier), percent (95% CI) 0 to 3    20 (12 to 28%) 4    12  (6 to 18%) 5    7 (3 to 11%) 6    8 (4 to 12%) 7    6 (2 to 10%) 8    6 (2 to 10%) 9, 10    2 (0 to 4%)  RoPE score   PFO-attributable fraction, percent (95% CI) 0 to 3    0 (0 to 4%) 4    38 (25 to 48%) 5    34 (21 to 45%) 6    62 (54 to 68%) 7    72 (66 to 76%) 8    84 (79 to 87%) 9, 10    88 (83 to 91%)    For this patients PFO, there is a low likelihood of contribution to stroke/TIA with a RoPE score of 0 to 3.  Above score calculated as 1 point each if NOT present [HTN, DM2, h/o CVA/TIA, Smoker, Cortical imaging on infarct] Above score calculated as 5 points if [  Age 43 to 29], 4 points if [Age 61 to 39], 3 points if [Age 61 to 49], 2 points if [Age 68 to 59], 1 point if [Age 43 to 69], 0 points if [Age > 70]  RoPE score is total sum of individual points (higher total is LESS risk of stroke)  [place] has a rope score of 1 due to cortical infarct on imaging corresponding to a 0-4% PFO attributable CVA. Although the patient has a low rope score, indicating that the likelihood of a PFO related stroke is low, she has had multiple recurrent strokes despite anticoagulation and had a weakly positive transcranial Doppler.  Will proceed with TEE tomorrow, scheduled at 1:00 PM for further evaluation of potential PFO.  If PFO larger than anticipated or has high risk anatomy with an atrial septal aneurysm, we may consider percutaneous transcatheter PFO closure.  After careful review of history and examination, the risks and benefits of transesophageal echocardiogram have been explained including risks of esophageal damage, perforation (1:10,000 risk), bleeding, pharyngeal hematoma as well as other potential complications associated with conscious sedation including aspiration, arrhythmia, respiratory failure and death. Alternatives to treatment were discussed, questions were answered. Patient is willing to proceed.    Dr. Burt Knack to follow.   Signed, Angelena Form, PA-C  09/13/2019 9:22  AM  Patient seen, examined. Available data reviewed. Agree with findings, assessment, and plan as outlined by Nell Range, PA-C.  The patient is independently interviewed and examined.  Her son is at the bedside.  She is a delightful 69 year old woman resting comfortably in no acute distress.  HEENT is normal, JVP is normal, there are no carotid bruits, lung fields are clear, heart is regular rate and rhythm with a soft ejection murmur at the right upper sternal border.  There is no diastolic murmur.  Abdomen is soft and nontender.  Extremities have no pretibial edema.  Skin is warm and dry without rash.  I have reviewed extensive data from her recent hospitalizations for recurrent strokes.  She has undergone a 2D echo study that showed no significant cardiac abnormality with normal valve function and normal LV/RV function.  There was a transcranial Doppler study done that demonstrates trivial right to left shunt at rest and small shunt with Valsalva, possibly suggestive of a small PFO.  The patient has a documented DVT, age-indeterminate, in the right peroneal veins and also has acute segmental and subsegmental pulmonary embolus without evidence of right heart strain.  She has moderate plaque noted at the right carotid bulb without high-grade obstruction.  She has had recurrent stroke on anticoagulation.  I agree that it is reasonable to proceed with transesophageal echo to better define her interatrial septal anatomy and make sure that she does not have a high anatomic risk PFO with either a large defect or an interatrial septal aneurysm.  If only a small PFO is present, I would be inclined to continue with medical therapy considering her age and comorbid conditions. I have reviewed risks/indications/alternatives to transesophageal echo with the patient and her son. They understand and agree to proceed.  Sherren Mocha, M.D. 09/13/2019 3:08 PM

## 2019-09-13 NOTE — Progress Notes (Addendum)
Inpatient Diabetes Program Recommendations  AACE/ADA: New Consensus Statement on Inpatient Glycemic Control  Target Ranges:  Prepandial:   less than 140 mg/dL      Peak postprandial:   less than 180 mg/dL (1-2 hours)      Critically ill patients:  140 - 180 mg/dL  Results for TANYKA, ALDERS" (MRN [id number]) as of 09/13/2019 11:47  Ref. Range 09/12/2019 07:35 09/12/2019 12:01 09/12/2019 16:47 09/12/2019 21:23 09/13/2019 06:33 09/13/2019 11:46  Glucose-Capillary Latest Ref Range: 70 - 99 mg/dL 161 (H) 146 (H) 151 (H) 226 (H) 156 (H) 199 (H)    Results for AIZLEY, FRACASSI" (MRN [id number]) as of 09/13/2019 11:47  Ref. Range 09/08/2018 08:35 02/25/2019 08:28 04/26/2019 09:32 07/22/2019 11:01 08/19/2019 05:00  Hemoglobin A1C Latest Ref Range: 4.8 - 5.6 % 6.8 (H) 6.9 (H) 7.0 (H) 9.7 (H) 9.4 (H)   Review of Glycemic Control  Diabetes history: DM2 Outpatient Diabetes medications: Metformin 1000 mg BID Current orders for Inpatient glycemic control: Novolog 0-9 units TID with meals, Novolog 0-5 units QHS  Inpatient Diabetes Program Recommendations:   A1C: Noted A1C 9.4% on 08/19/19 indicating an average glucose of 223 mg/dl over the past 2-3 months.   NOTE: Noted consult regarding DM control. Chart reviewed. Noted patient received steroid injections in her shoulder in December and was also on Prednisone taper at the end of December for COPD. Prior to December, patient's A1C was 6.8-7% in 2020. Anticipate elevated A1C in December and January 2021 was due to recent steroids.  Addendum 09/13/19@13 :30-Talked with patient regarding DM control. Patient reports that she is currently taking Metformin 1000 mg BID for DM control. Patient reports that her PCP had switched her off Metformin to Jardiance but glucose control got worse on Jardiance so it was discontinued and Metformin was restarted. Patient states that she checks glucose at home once a day . Discussed A1C of 9.4% on 08/19/19 and explained that A1C  indicates an average glucose of 223 mg/dl. Patient reports that her glucose at home is usually in the low 100's mg/dl. Discussed recent steroids (shoulder injection and oral Prednisone) and how it impacts glycemic control. Explained that recent steroid use likely contributed to elevated A1C especially given that A1C was trending well in 2020 prior to December. Encouraged patient to continue to check glucose at home and to follow up with PCP regarding DM control. Patient verbalized understanding of information and states that she has no questions related to DM at this time.  Thanks, Barnie Alderman, RN, MSN, CDE Diabetes Coordinator Inpatient Diabetes Program [phone] (Team Pager from 8am to 5pm)

## 2019-09-13 NOTE — H&P (Addendum)
Physical Medicine and Rehabilitation Admission H&P    Chief Complaint  Patient presents with  . Weakness  : HPI: [name] [name] is a 69 year old right-handed female with history of CAD with history of angioplasty, CKD stage II, COPD with chronic hypoxemic respiratory failure maintained on 3 L of oxygen at home at night, type 2 diabetes mellitus, recent shoulder fracture nonsurgical managed with a sling, hypertension, hyperlipidemia, thoracic aortic aneurysm, patient quit smoking 12 months ago.  History taken from chart review and patient. Patient lives alone.  1 level home with a ramped entrance.  Used a walker occasionally prior to admission.  Her son did help some basic ADLs.  Patient had been admitted 08/18/2019 to 08/20/2019 for acute CVA with MRI showing watershed infarction in bilateral hemispheres.  CT angiogram head and neck revealed 50% stenosis of the proximal right internal carotid artery due to bulky atherosclerotic calcification.  Carotid Dopplers with no ICA stenosis.  TTE with normal EF.  Patient was discharged home on aspirin and Plavix x3 months then Plavix alone.  On 08/23/2019 hospital evaluation by PCP noted to have shortness of breath where CT angiogram of the chest showed acute bilateral segmental subsegmental pulmonary emboli.  Subsequent DVT scan showed right lower extremity DVT involving the right peroneal veins.  Patient was started on Xarelto replacing aspirin and Plavix.  On 08/30/2019 bubble study done by outpatient neurology showed a small PFO but it was not felt large enough to allow for emboli to pass from right to left side of heart.  Patient presented to the ED on 09/07/2019 due to left hemi-paresis. CT of the head as well as MRI of the brain showed evolution of old stroke without new infarcts. As a result, patient was subsequently discharged to home.  She presented on 09/11/2019 with increasing left-sided weakness as well as hypotension.  She did receive 500 mL fluid bolus.   MRI and imaging revealed continued interval evolution of previously seen subacute infarcts with multiple new scattered acute ischemic watershed type infarctions involving the bilateral cerebral hemispheres, right greater than left.  Associated petechial hemorrhage about a few of these infarcts without frank hemorrhagic transformation.  Small volume subarachnoid hemorrhage layering along the cortical sulcus within the right frontal lobe, new from previous tracing.  Neurology consulted hypercoagulable work-up.  Patient Xarelto has been changed to Eliquis given new infarctions and PE.  A follow-up stat cranial CT scan was completed this morning for increased left side weakness. Personally reviewed, no acute intracranial abnormalities.  However, given current deficits, suspect futher extension. Discussed with Neurology weakness, PFO, and plans for possible intervention.  Cardiology service follow-up in regards to recent findings of PFO. TEE completed, positive for PFO. Please see preadmission assessment from earlier today as well.   Review of Systems  Constitutional: Negative for chills and fever.  HENT: Positive for tinnitus.   Eyes: Negative for blurred vision and double vision.  Respiratory: Positive for shortness of breath.   Cardiovascular: Negative for chest pain and palpitations.  Gastrointestinal: Positive for constipation. Negative for heartburn, nausea and vomiting.       GERD  Genitourinary: Negative for dysuria, flank pain and hematuria.  Musculoskeletal: Positive for joint pain and myalgias.  Skin: Negative for rash.  Neurological: Positive for dizziness, sensory change, focal weakness and weakness.  Psychiatric/Behavioral: Positive for depression. The patient has insomnia.        Anxiety   Past Medical History:  Diagnosis Date  . Aneurysm, thoracic aortic ([place])   .  Anxiety   . Arthritis   . CAD (coronary artery disease)   . Chronic kidney disease    Renal insufficiency  . COPD  (chronic obstructive pulmonary disease) ([place])   . Depression   . Diabetes mellitus   . Diverticulosis of colon (without mention of hemorrhage)   . Duodenitis without mention of hemorrhage   . Embolic stroke ([place])   . GERD (gastroesophageal reflux disease)   . Hemorrhoids   . Hypercholesteremia   . Hypertension   . Insomnia   . Pulmonary embolism ([place])   . Tinnitus   . Vertigo    Past Surgical History:  Procedure Laterality Date  . ANGIOPLASTY    . CHOLECYSTECTOMY    . ROTATOR CUFF REPAIR Right 04/2012  . TUBAL LIGATION     Family History  Problem Relation Age of Onset  . Diabetes Mother   . Hemophilia Mother   . Breast cancer Sister   . Heart disease Father   . Colon polyps Father   . Diabetes Brother   . Colon polyps Sister    Social History:  reports that she quit smoking about 13 months ago. Her smoking use included cigarettes. She has never used smokeless tobacco. She reports that she does not drink alcohol or use drugs. Allergies:  Allergies  Allergen Reactions  . Guaifenesin & Derivatives Anaphylaxis    Severe Reaction  . Tape Other (See Comments)    TAPE PEELS OFF THE SKIN!!!!!!!  . Benazepril Cough  . Morphine Nausea Only  . Zyrtec [Cetirizine] Swelling and Other (See Comments)    Hands and feet became swollen  . Latex Rash and Other (See Comments)    No powdered gloves!!   Medications Prior to Admission  Medication Sig Dispense Refill  . rivaroxaban (XARELTO) 20 MG TABS tablet Take 1 tablet (20 mg total) by mouth daily with supper. 30 tablet 2  . albuterol (VENTOLIN HFA) 108 (90 Base) MCG/ACT inhaler Inhale 2 puffs into the lungs every 4 (four) hours as needed for wheezing or shortness of breath. 54 g 3  . atorvastatin (LIPITOR) 20 MG tablet Take 1 tablet (20 mg total) by mouth daily. (Patient taking differently: Take 20 mg by mouth at bedtime. ) 90 tablet 3  . bimatoprost (LUMIGAN) 0.01 % SOLN Place 1 drop into both eyes at bedtime.    . brinzolamide  (AZOPT) 1 % ophthalmic suspension Place 1 drop into both eyes every 12 (twelve) hours.     . clopidogrel (PLAVIX) 75 MG tablet Take 1 tablet (75 mg total) by mouth daily. (Patient not taking: Reported on 09/07/2019) 30 tablet 1  . Continuous Blood Gluc Sensor (FREESTYLE LIBRE 14 DAY SENSOR) MISC CHANGE EVERY 14 DAYS (Patient taking differently: Inject 1 patch into the skin every 14 (fourteen) days. ) 2 each 3  . dexlansoprazole (DEXILANT) 60 MG capsule Take 1 capsule (60 mg total) by mouth daily. 90 capsule 3  . diazepam (VALIUM) 5 MG tablet Take 1 tablet (5 mg total) by mouth every 12 (twelve) hours as needed for anxiety (insomnia). (Patient taking differently: Take 5 mg by mouth every 12 (twelve) hours as needed (anxiety or insomnia). ) 60 tablet 1  . diltiazem (CARDIZEM) 120 MG tablet Take 120 mg by mouth daily.    . ergocalciferol (VITAMIN D2) 1.25 MG (50000 UT) capsule Take 1 capsule (50,000 Units total) by mouth once a week. (Patient taking differently: Take 50,000 Units by mouth every Tuesday. ) 24 capsule 0  . escitalopram (LEXAPRO)  10 MG tablet Take 1 tablet (10 mg total) by mouth daily. 90 tablet 3  . furosemide (LASIX) 40 MG tablet Take 1 tablet (40 mg total) by mouth daily. 30 tablet 3  . HYDROcodone-acetaminophen (NORCO) 10-325 MG per tablet Take 1 tablet by mouth every 6 (six) hours as needed for moderate pain.     Marland Kitchen ipratropium-albuterol (DUONEB) 0.5-2.5 (3) MG/3ML SOLN INHALE 1 VIAL VIA NEBULIZER EVERY 6 HOURS AS NEEDED (Patient taking differently: Take 3 mLs by nebulization every 6 (six) hours as needed (as directed). ) 360 mL 0  . losartan (COZAAR) 100 MG tablet Take 1 tablet (100 mg total) by mouth daily. 90 tablet 3  . metFORMIN (GLUCOPHAGE) 1000 MG tablet Take 1 tablet (1,000 mg total) by mouth 2 (two) times daily with a meal. 60 tablet 1  . OXYGEN Inhale 2 L into the lungs at bedtime.     . potassium chloride SA (KLOR-CON) 20 MEQ tablet Take 1 tablet (20 mEq total) by mouth daily.  5 tablet 0  . rOPINIRole (REQUIP) 2 MG tablet Take 1 tablet (2 mg total) by mouth 3 (three) times daily. (Patient taking differently: Take 2 mg by mouth at bedtime. ) 270 tablet 3  . Tiotropium Bromide-Olodaterol 2.5-2.5 MCG/ACT AERS Inhale 2 puffs into the lungs daily. (Patient taking differently: Inhale 1 puff into the lungs daily. ) 12 g 3    Drug Regimen Review Drug regimen was reviewed and remains appropriate with no significant issues identified  Home: Home Living Family/patient expects to be discharged to:: Private residence Living Arrangements: Alone Available Help at Discharge: Family Type of Home: House Home Access: Ramped entrance Home Layout: One level Bathroom Shower/Tub: Multimedia programmer: Handicapped height Bathroom Accessibility: Yes Home Equipment: Environmental consultant - 2 wheels, Wheelchair - manual, Shower seat, Grab bars - toilet, Grab bars - tub/shower Additional Comments: 82 y/o granddaughter helps during the day and son and daughter in law help at night and weekends per pt report.   Functional History: Prior Function Level of Independence: Needs assistance Gait / Transfers Assistance Needed: using walker occasionally, but got worse over day and half pta ADL's / Homemaking Assistance Needed: son helped with shower Comments: drives and does cooking and cleaning, still doing it since shoulder injury, just lost husband in July due to suicide  Functional Status:  Mobility: Bed Mobility Overal bed mobility: Needs Assistance Bed Mobility: Supine to Sit Supine to sit: HOB elevated, Min assist Sit to supine: Min assist General bed mobility comments: cues to use rail, assist for trunk, increased time scooting to EOB Transfers Overall transfer level: Needs assistance Equipment used: Rolling walker (2 wheeled) Transfers: Sit to/from Stand, [employer] Transfers Sit to Stand: Mod assist, Min assist Stand pivot transfers: Min assist General transfer comment: cues  for safe hand placement and technique to initaite power up and rise to RW. HHA/support provided at Lt UE to raise it to the RW. Ambulation/Gait Ambulation/Gait assistance: Min assist Gait Distance (Feet): 20 Feet(20 ft total (1x13', 1x7')) Assistive device: Rolling walker (2 wheeled) Gait Pattern/deviations: Step-to pattern, Decreased stride length, Decreased dorsiflexion - left, Scissoring, Step-through pattern General Gait Details: visual cues provided for Lt foot step length/foot placement; pt continues with scissoring step pattern and required min assist ~ 50% of time for Lt foot positioning. pt ambulated 2 bouts in hospital room with seated rest break in recliner between bouts due to fatigue and Lt shoulder pain. Gait velocity: decreased    ADL: ADL Overall ADL's :  Needs assistance/impaired Eating/Feeding: Set up Eating/Feeding Details (indicate cue type and reason): to open containers Grooming: Wash/dry hands, Wash/dry face, Oral care, Standing, Minimal assistance Upper Body Bathing: Sitting, Moderate assistance Upper Body Bathing Details (indicate cue type and reason): d/t L shoulder pain and weakness Lower Body Bathing: Sit to/from stand, Moderate assistance Upper Body Dressing : Sitting, Moderate assistance Lower Body Dressing: Sit to/from stand, Maximal assistance Toilet Transfer: Ambulation, RW, Minimal assistance General ADL Comments: min - mod A sit <> stand and min A stand pivot transfer with RW  Cognition: Cognition Overall Cognitive Status: Within Functional Limits for tasks assessed Arousal/Alertness: Awake/alert Orientation Level: Oriented X4 Problem Solving: Appears intact Safety/Judgment: Appears intact Cognition Arousal/Alertness: Awake/alert Behavior During Therapy: WFL for tasks assessed/performed Overall Cognitive Status: Within Functional Limits for tasks assessed  Physical Exam: Blood pressure (!) 181/72, pulse 72, temperature 97.9 F (36.6 C),  temperature source Oral, resp. rate 19, height 5\' 4"  (1.626 m), weight 91 kg, SpO2 95 %. Physical Exam  Vitals reviewed. Constitutional: She appears well-developed and well-nourished.  HENT:  Head: Normocephalic and atraumatic.  Eyes: EOM are normal. Right eye exhibits no discharge. Left eye exhibits no discharge.  Neck: No tracheal deviation present. No thyromegaly present.  Respiratory: Effort normal. No stridor. No respiratory distress.  GI: Soft. She exhibits no distension.  Musculoskeletal:     Comments: No edema or tenderness in extremities  Neurological: She is alert.  Patient is alert sitting up in bed.   Follows full commands.   Makes good eye contact with examiner.   She provides her name age and date of birth.   Motor: RUE/RLE: 5/5 proximal distal LUE: Shoulder abduction, elbow flexion/extension 0/5, handgrip 4/5 with myotonia-like LLE: Hip flexion 1/5, distally 0/5 Sensation diminished to touch bilateral plantar feet Left neglect  Skin: Skin is warm and dry.  Psychiatric: Her affect is blunt. She is slowed.    Results for orders placed or performed during the hospital encounter of 09/11/19 (from the past 48 hour(s))  CBG monitoring, ED     Status: Abnormal   Collection Time: 09/12/19  7:35 AM  Result Value Ref Range   Glucose-Capillary 161 (H) 70 - 99 mg/dL  Magnesium     Status: Abnormal   Collection Time: 09/12/19  8:53 AM  Result Value Ref Range   Magnesium 1.6 (L) 1.7 - 2.4 mg/dL    Comment: Performed at [facility] [address] [place], [place] [address]  Glucose, capillary     Status: Abnormal   Collection Time: 09/12/19 12:01 PM  Result Value Ref Range   Glucose-Capillary 146 (H) 70 - 99 mg/dL  Glucose, capillary     Status: Abnormal   Collection Time: 09/12/19  4:47 PM  Result Value Ref Range   Glucose-Capillary 151 (H) 70 - 99 mg/dL  Glucose, capillary     Status: Abnormal   Collection Time: 09/12/19  9:23 PM  Result Value Ref Range    Glucose-Capillary 226 (H) 70 - 99 mg/dL  Glucose, capillary     Status: Abnormal   Collection Time: 09/13/19  6:33 AM  Result Value Ref Range   Glucose-Capillary 156 (H) 70 - 99 mg/dL  Glucose, capillary     Status: Abnormal   Collection Time: 09/13/19 11:46 AM  Result Value Ref Range   Glucose-Capillary 199 (H) 70 - 99 mg/dL   Comment 1 Notify RN    Comment 2 Document in Chart   Glucose, capillary  Status: Abnormal   Collection Time: 09/13/19  4:35 PM  Result Value Ref Range   Glucose-Capillary 284 (H) 70 - 99 mg/dL  Glucose, capillary     Status: Abnormal   Collection Time: 09/13/19 10:20 PM  Result Value Ref Range   Glucose-Capillary 172 (H) 70 - 99 mg/dL  Basic metabolic panel     Status: Abnormal   Collection Time: 09/14/19  2:36 AM  Result Value Ref Range   Sodium 139 135 - 145 mmol/L   Potassium 3.9 3.5 - 5.1 mmol/L    Comment: SLIGHT HEMOLYSIS   Chloride 102 98 - 111 mmol/L   CO2 26 22 - 32 mmol/L   Glucose, Bld 165 (H) 70 - 99 mg/dL   BUN 11 8 - 23 mg/dL   Creatinine, Ser 0.92 0.44 - 1.00 mg/dL   Calcium 8.7 (L) 8.9 - 10.3 mg/dL   GFR calc non Af Amer >60 >60 mL/min   GFR calc Af Amer >60 >60 mL/min   Anion gap 11 5 - 15    Comment: Performed at [facility], [place] [address] [place], [place] [address]  CBC with Differential/Platelet     Status: Abnormal   Collection Time: 09/14/19  2:36 AM  Result Value Ref Range   WBC 9.2 4.0 - 10.5 K/uL   RBC 4.18 3.87 - 5.11 MIL/uL   Hemoglobin 11.9 (L) 12.0 - 15.0 g/dL   HCT 36.9 36.0 - 46.0 %   MCV 88.3 80.0 - 100.0 fL   MCH 28.5 26.0 - 34.0 pg   MCHC 32.2 30.0 - 36.0 g/dL   RDW 15.4 11.5 - 15.5 %   Platelets 300 150 - 400 K/uL   nRBC 0.0 0.0 - 0.2 %   Neutrophils Relative % 49 %   Neutro Abs 4.6 1.7 - 7.7 K/uL   Lymphocytes Relative 38 %   Lymphs Abs 3.4 0.7 - 4.0 K/uL   Monocytes Relative 9 %   Monocytes Absolute 0.8 0.1 - 1.0 K/uL   Eosinophils Relative 3 %   Eosinophils Absolute 0.2 0.0 - 0.5 K/uL    Basophils Relative 0 %   Basophils Absolute 0.0 0.0 - 0.1 K/uL   Immature Granulocytes 1 %   Abs Immature Granulocytes 0.06 0.00 - 0.07 K/uL    Comment: Performed at [facility] [address], [place], [place] [address]  Magnesium     Status: None   Collection Time: 09/14/19  2:36 AM  Result Value Ref Range   Magnesium 2.0 1.7 - 2.4 mg/dL    Comment: Performed at [facility] [address] [place], [place] [address]  Glucose, capillary     Status: Abnormal   Collection Time: 09/14/19  6:06 AM  Result Value Ref Range   Glucose-Capillary 149 (H) 70 - 99 mg/dL   No results found.     Medical Problem List and Plan: 1.  Left hemiparesis, limitations in self-care secondary to cerebral hemisphere watershed infarct with petechial hemorrhage and patient with bilateral cerebral and right occipital infarcts last week as well as pulmonary emboli with DVT.  -patient may shower  -ELOS/Goals: 17-20 days/Min A  Admit to CIR 2.  Antithrombotics: -DVT/anticoagulation: Eliquis  -antiplatelet therapy: N/A 3. Pain Management: Lidoderm patch change as directed, hydrocodone as needed 4. Mood: Valium as needed for anxiety and depression  -antipsychotic agents: N/A 5. Neuropsych: This patient is capable of making decisions on her own behalf. 6. Skin/Wound Care: Routine skin checks 7.  Fluids/Electrolytes/Nutrition: Routine in and outs.  CMP ordered. 8.  PFO.  Follow cardiology services.  TEE completed, suggesting PFO, await further Cards recs.. 9.  Hypertension.  Cardizem 120 mg daily, Lasix 20 mg daily, Cozaar 50 mg daily  Monitor with increased mobility 10.  Hyperlipidemia: Continue Lipitor 11.  Diabetes mellitus with hyperglycemia.  Hemoglobin A1c 9.4.  SSI.  Patient on Glucophage 1000 mg twice daily prior to admission.  Resume as needed  Monitor with increased mobility 12.  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  Continue nebulizer  treatments.  Check oxygen saturations every shift  Monitor with increased exertion 13.  CKD stage II.    CMP ordered 14.  Restless leg syndrome.  Requip 2 mg 3 times daily  Cathlyn Parsons, PA-C 09/14/2019  I have personally performed a face to face diagnostic evaluation, including, but not limited to relevant history and physical exam findings, of this patient and developed relevant assessment and plan.  Additionally, I have reviewed and concur with the physician assistant's documentation above.  Delice Lesch, MD, ABPMR

## 2019-09-13 NOTE — Progress Notes (Signed)
STROKE TEAM PROGRESS NOTE   INTERVAL HISTORY Patient loks lot better . motre alert and interactive. Has weaker pulses on right but CTA on admission does not show any proximal right subclavian stenosis.Await reahab transfer after TEE  Vitals:   09/13/19 0327 09/13/19 0739 09/13/19 1149 09/13/19 1236  BP: (!) 173/69 92/65 (!) 199/81   Pulse: 81 78 84   Resp: 18 20 20 14   Temp: 98 F (36.7 C) (!) 97.5 F (36.4 C) (!) 97.4 F (36.3 C)   TempSrc: Oral Oral Oral   SpO2: 93% 92% 94%   Weight:      Height:        CBC:  Recent Labs  Lab 09/07/19 1623 09/07/19 1623 09/07/19 1641 09/11/19 1556  WBC 12.0*  --   --  9.9  NEUTROABS 7.7  --   --  5.2  HGB 12.8   < > 13.3 12.3  HCT 39.8   < > 39.0 38.2  MCV 89.0  --   --  88.6  PLT 340  --   --  315   < > = values in this interval not displayed.    Basic Metabolic Panel:  Recent Labs  Lab 09/07/19 1623 09/07/19 1623 09/07/19 1641 09/11/19 1556 09/12/19 0853  NA 137   < > 138 141  --   K 3.3*   < > 3.2* 3.8  --   CL 99   < > 96* 106  --   CO2 25  --   --  25  --   GLUCOSE 142*   < > 142* 124*  --   BUN 17   < > 19 12  --   CREATININE 0.95   < > 0.90 0.79  --   CALCIUM 9.1  --   --  9.4  --   MG  --   --   --  1.6* 1.6*   < > = values in this interval not displayed.   Lipid Panel:     Component Value Date/Time   CHOL 136 08/19/2019 0500   TRIG 70 08/19/2019 0500   HDL 53 08/19/2019 0500   CHOLHDL 2.6 08/19/2019 0500   VLDL 14 08/19/2019 0500   LDLCALC 69 08/19/2019 0500   LDLCALC 98 04/26/2019 0932   HgbA1c:  Lab Results  Component Value Date   HGBA1C 9.4 (H) 08/19/2019   Urine Drug Screen:     Component Value Date/Time   LABOPIA POSITIVE (A) 09/07/2019 1950   COCAINSCRNUR NONE DETECTED 09/07/2019 1950   LABBENZ POSITIVE (A) 09/07/2019 1950   AMPHETMU NONE DETECTED 09/07/2019 1950   THCU NONE DETECTED 09/07/2019 1950   LABBARB NONE DETECTED 09/07/2019 1950    Alcohol Level     Component Value Date/Time    ETH <10 09/07/2019 1623    IMAGING past 48 hours MR BRAIN WO CONTRAST  Result Date: 09/11/2019 CLINICAL DATA:  Initial evaluation for acute difficulty walking, stroke suspected. EXAM: MRI HEAD WITHOUT CONTRAST TECHNIQUE: Multiplanar, multiecho pulse sequences of the brain and surrounding structures were obtained without intravenous contrast. COMPARISON:  Comparison made with multiple recent brain MRIs, most recent of which from 09/07/2019. FINDINGS: Brain: Generalized age-related cerebral atrophy with chronic microvascular ischemic disease again noted. Multiple remote lacunar infarcts present within the hemispheric cerebral white matter. Continued interval evolution of previously seen subacute watershed type infarcts involving the bilateral cerebral hemispheres. However, multiple new watershed type infarcts are now seen on today's exam, increased in size and number  but with similar distribution as compared to previous. Overall, involvement is slightly worse within the right cerebral hemisphere as compared to the left. New patchy involvement of the bilateral parieto-occipital regions as well, also greater on the right (series 5, image 66). Now seen are a few scattered foci of susceptibility artifact associated with these infarcts, consistent with associated petechial hemorrhage (series 14, image 46). Additionally, there is a serpiginous focus of FLAIR and susceptibility artifact seen layering along a cortical sulcus within the right frontal lobe, concerning for a small volume subarachnoid hemorrhage, new from previous (series 14, image 46). No frank intraparenchymal hematoma. No significant mass effect. No mass lesion or midline shift. No hydrocephalus. No extra-axial fluid collection. Pituitary gland suprasellar region normal. Midline structures intact. Vascular: Major intracranial vascular flow voids are maintained. Skull and upper cervical spine: Craniocervical junction normal. Upper cervical spine within  normal limits. Bone marrow signal intensity normal. No scalp soft tissue abnormality. Sinuses/Orbits: Globes and orbital soft tissues within normal limits. Paranasal sinuses remain largely clear. No significant mastoid effusion. Other: None. IMPRESSION: 1. Continued interval evolution of previously seen subacute infarcts, with multiple new scattered acute ischemic watershed type infarcts involving the bilateral cerebral hemispheres, right greater than left. Associated petechial hemorrhage about a few of these infarcts without frank hemorrhagic transformation. 2. Small volume subarachnoid hemorrhage layering along the cortical sulcus within the right frontal lobe, new from previous. 3. Otherwise stable exam with underlying cerebral atrophy, chronic microvascular ischemic disease, and multiple remote lacunar infarcts involving the hemispheric cerebral white matter. Critical Value/emergent results were called by telephone at the time of interpretation on 09/11/2019 at 8:43 pm to provider [facility] , who verbally acknowledged these results. Electronically Signed   By: Jeannine Boga M.D.   On: 09/11/2019 20:43    PHYSICAL EXAM Elderly Caucasian lady appears sleepy but not in distress.  Right-sided pulses and blood pressure appear feeble compared to the left side  . Afebrile. Head is nontraumatic. Neck is supple without bruit.    Cardiac exam no murmur or gallop. Lungs are clear to auscultation. Distal pulses are well felt. Neurological Exam :  She is awake alert oriented to time place and person.  She follows commands well.  Pupils are equal reactive.  Extraocular movements are full range without nystagmus.  She blinks to threat bilaterally.  Mild left lower facial asymmetry.  Tongue midline.  Motor system exam shows mild left hemiparesis with 3-4/5 left upper extremity strength with weakness of grip and intrinsic hand muscles.  Left lower extremity strength is 3/5 with drift.  Sensation is preserved  bilaterally deep tendon flexes are symmetric plantars downgoing.  Gait not tested. ASSESSMENT/PLAN Ms. ORBA LAFLAIR is a 69 y.o. female with history of hypertension, hyperlipidemia, diabetes mellitus, coronary artery disease with a recent diagnosis of bilateral strokes in the watershed distribution on 08/18/2019, pulmonary embolism on Xarelto on 1/13, PFO noted on TCD presenting with worsening L sided weakness.    Stroke:   New R>L Cerebral hemisphere watershed infarcts w/ petechial hemorrhage in pt w/ B cerebral and R occipital infarcts last week as well as PE and DVT on Xarelto without missing any doses. Also NEW R frontal lobe SAH. ? .  MRI  Evolution of multifocal subacute watershed infarct B cerebral hemispheres with NEW R>L infarcts with associated petechial hemorrhage in a few. Small SAH R frontal lobe cortical sulcus, new from last week. Stable Small vessel disease. Atrophy.   Carotid stenosis ruled out:  Previous CTA  showed R ICA 50% stenosis however carotid doppler with 1-39% R ICA stenosis  LDL 69  HgbA1c 9.4  Was discharged on aspirin and plavix. Changed to Xarelto since d/c for new PE. Given new infarcts on xarelto, change to Eliquis PE dosing.   Agree with plans for hypercoagulable workup   Therapy recommendations:  pending   Disposition:  pending   PFO  Found during follow up  Likely incidental finding and not related to current strokes  PE / R lower leg DVT  Found segmental and subsegmental PE by PCP when c/o of SOB   RLE DVT felt to be provoked in setting of recent shoulder fx  Referred to hematology for hypercoagulable work up, not yet done   On Xarelto PTA (stopped aspirin and plavix started for recent stroke)  Change to Eliquis, PE dose  Hypertension Hypotension in ED w/ BP fluctuation between arms . Variable in ED.  Marland Kitchen Permissive hypertension (OK if < 220/120) but gradually normalize in 5-7 days . Long-term BP goal  normotensive  Hyperlipidemia  Home meds:  lipitor 20, resumed in hospital  LDL 69, goal < 70  No not put on intensive statin given LDL < goal and advanced age  Continue statin at discharge  Diabetes type II Uncontrolled  HgbA1c 9.4, goal < 7.0  CBGs Recent Labs    09/12/19 2123 09/13/19 0633 09/13/19 1146  GLUCAP 226* 156* 199*      SSI  Other Stroke Risk Factors  Advanced age  Cigarette smoker, quit ~1 yr ago, advised to stop smoking  Obesity, Body mass index is 34.44 kg/m., recommend weight loss, diet and exercise as appropriate   Hx stroke/TIA  08/2019 - R>L anterior circulation infarct embolic d/t unknown source. Placed on aspirin and plavix x 3 weeks then plavix alone. Found to have PFO as an OP. Found to have PE and started on Xarelto.  Coronary artery disease s/p PCI  Other Active Problems  CKD stage II  COPD/chronic hypoxemic respiratory failure on Home O2 @3L   Hypomagnesemia   Hospital day # 2 Patient has presented with recurrent strokes of cryptogenic etiology but has had recent DVT and pulmonary embolism and was on anticoagulation with Xarelto.  Transcranial Doppler bubble study had shown a small PFO which was felt not to be significant however given patient's recurrent strokes and failure of anticoagulation would recommend check transesophageal echocardiogram to further evaluate the PFO to see if it is amenable to endovascular treatment.  Agree with changing Xarelto to Eliquis for anticoagulation.  Discussed with Dr. Vivia Ewing. And cardiology PA-c Marita Snellen Greater than 50% time during this 25-minute visit was spent on counseling and coordination of care and discussion with care team and answering questions.  Antony Contras, MD  To contact Stroke Continuity provider, please refer to [email or web address] After hours, contact General Neurology

## 2019-09-13 NOTE — Progress Notes (Signed)
Inpatient Rehabilitation Admissions Coordinator  Inpatient rehab consult received. I met with patient at bedside for rehab assessment, discuss goals, expectations and cost of care. Pt is a good candidate for CIR. I await medical workup completion and then plan admit. Pt in agreement. I will follow up tomorrow.  Danne Baxter, RN, MSN Rehab Admissions Coordinator [phone] 09/13/2019 11:35 AM

## 2019-09-13 NOTE — Evaluation (Signed)
Occupational Therapy Evaluation Patient Details Name: [name] [name] MRN: [id number] DOB: 1951-02-12 Today's Date: 09/13/2019    History of Present Illness [name] [name] is a 69 y.o. female with medical history significant of CAD, CKD stage II, COPD/chronic hypoxemic respiratory failure on 3 L home oxygen at night, type 2 diabetes, hypertension, hyperlipidemia, thoracic aortic aneurysm, and recently admitted 1/7-1/9 for acute CVA with MRI showing watershed infarcts in bilateral hemispheres.  Dopplers done 1/19 revealed right lower extremity DVT and CT angiogram chest done 1/12 revealed acute bilateral segmental and subsegmental pulmonary emboli.  Seen in ED 1/27 for new weakness, but negative work up so d/c.  Admitted 1/31 with L side weakness; MRI brain done today showing continued interval evolution of previously seen subacute infarcts with multiple new scattered acute ischemic watershed type infarcts involving the bilateral cerebral hemispheres, right greater than left.  Associated petechial hemorrhage about a few of these infarcts without frank hemorrhagic transformation.  Small volume subarachnoid hemorrhage layering along the cortical sulcus within the right frontal lobe, new from previous.   Clinical Impression   Patient presenting with decreased I in self care, balance, functional mobility/transfers, endurance, strength, and increased pain.  Patient reports being independent before fall last November and since then has had intermittent assist from family living next door PTA. Patient currently functioning at min - mod A for functional transfers/mobility, UB min A, LB mod - max A. Patient will benefit from acute OT to increase overall independence in the areas of ADLs, functional mobility, and safety awareness in order to safely discharge to next venue of care.    Follow Up Recommendations  Supervision/Assistance - 24 hour;CIR    Equipment Recommendations  Other (comment)(defer to next  venue of care)    Recommendations for Other Services (none at this time)     Precautions / Restrictions Precautions Precautions: Fall Precaution Comments: L Humeral fx      Mobility Bed Mobility Overal bed mobility: Needs Assistance Bed Mobility: Supine to Sit     Supine to sit: HOB elevated Sit to supine: Min assist   General bed mobility comments: cues to use rail, assist for trunk, increased time scooting to EOB  Transfers Overall transfer level: Needs assistance Equipment used: Rolling walker (2 wheeled) Transfers: Sit to/from [facility] Sit to Stand: Mod assist Stand pivot transfers: Min assist       General transfer comment: cuing for safety and technique    Balance Overall balance assessment: Needs assistance Sitting-balance support: Feet supported Sitting balance-Leahy Scale: Fair     Standing balance support: Bilateral upper extremity supported;During functional activity Standing balance-Leahy Scale: Poor Standing balance comment: relaint on UE support for balance          ADL either performed or assessed with clinical judgement   ADL Overall ADL's : Needs assistance/impaired Eating/Feeding: Set up Eating/Feeding Details (indicate cue type and reason): to open containers Grooming: Wash/dry hands;Wash/dry face;Oral care;Standing;Minimal assistance   Upper Body Bathing: Sitting;Moderate assistance Upper Body Bathing Details (indicate cue type and reason): d/t L shoulder pain and weakness Lower Body Bathing: Sit to/from stand;Moderate assistance   Upper Body Dressing : Sitting;Moderate assistance   Lower Body Dressing: Sit to/from stand;Maximal assistance   Toilet Transfer: Ambulation;RW;Minimal assistance             General ADL Comments: min - mod A sit <> stand and min A stand pivot transfer with RW     Vision Baseline Vision/History: Wears glasses Wears Glasses: At all  times Patient Visual Report: No change from  baseline Vision Assessment?: No apparent visual deficits            Pertinent Vitals/Pain Pain Assessment: Faces Faces Pain Scale: Hurts little more Pain Location: L shoulder Pain Descriptors / Indicators: Aching Pain Intervention(s): Monitored during session;Repositioned     Hand Dominance Right   Extremity/Trunk Assessment Upper Extremity Assessment LUE Deficits / Details: AROM shoulder NT due to pain from preior injury, she used R UE to assist with lifting and placing L hand onto RW LUE Sensation: WNL LUE Coordination: decreased fine motor;decreased gross motor   Lower Extremity Assessment Lower Extremity Assessment: Defer to PT evaluation       Communication Communication Communication: No difficulties   Cognition Arousal/Alertness: Awake/alert Behavior During Therapy: WFL for tasks assessed/performed Overall Cognitive Status: Within Functional Limits for tasks assessed                   Home Living Family/patient expects to be discharged to:: Private residence Living Arrangements: Alone Available Help at Discharge: Family Type of Home: House Home Access: Ramped entrance     Home Layout: One level     Bathroom Shower/Tub: Occupational psychologist: Handicapped height Bathroom Accessibility: Yes   Home Equipment: Environmental consultant - 2 wheels;Wheelchair - manual;Shower seat;Grab bars - toilet;Grab bars - tub/shower   Additional Comments: 87 y/o granddaughter helps during the day and son and daughter in law help at night and weekends per pt report.      Prior Functioning/Environment Level of Independence: Needs assistance  Gait / Transfers Assistance Needed: using walker occasionally, but got worse over day and half pta ADL's / Homemaking Assistance Needed: son helped with shower   Comments: drives and does cooking and cleaning, still doing it since shoulder injury, just lost husband in July due to suicide        OT Problem List: Decreased  strength;Decreased range of motion;Decreased activity tolerance;Impaired balance (sitting and/or standing);Decreased coordination;Decreased knowledge of use of DME or AE;Decreased knowledge of precautions;Cardiopulmonary status limiting activity;Pain;Impaired UE functional use;Decreased safety awareness      OT Treatment/Interventions: Self-care/ADL training;DME and/or AE instruction;Therapeutic exercise;Cognitive remediation/compensation;Therapeutic activities;Balance training;Patient/family education    OT Goals(Current goals can be found in the care plan section) Acute Rehab OT Goals Patient Stated Goal: wants to go to rehab OT Goal Formulation: With patient Time For Goal Achievement: 09/27/19 Potential to Achieve Goals: Good ADL Goals Pt Will Perform Upper Body Bathing: with supervision Pt Will Perform Lower Body Bathing: with min guard assist Pt Will Perform Upper Body Dressing: with supervision Pt Will Perform Lower Body Dressing: with min guard assist Pt Will Transfer to Toilet: with min guard assist Pt Will Perform Toileting - Clothing Manipulation and hygiene: with min guard assist Pt Will Perform Tub/Shower Transfer: with min guard assist  OT Frequency: Min 2X/week   Barriers to D/C: Other (comment)  none known at this time          AM-PAC OT "6 Clicks" Daily Activity     Outcome Measure Help from another person eating meals?: A Little Help from another person taking care of personal grooming?: A Little Help from another person toileting, which includes using toliet, bedpan, or urinal?: A Lot Help from another person bathing (including washing, rinsing, drying)?: A Lot Help from another person to put on and taking off regular upper body clothing?: A Little Help from another person to put on and taking off regular lower body clothing?: A  Lot 6 Click Score: 15   End of Session Equipment Utilized During Treatment: Surveyor, mining Communication: Mobility  status  Activity Tolerance: Patient tolerated treatment well Patient left: with call bell/phone within reach;in chair;with chair alarm set  OT Visit Diagnosis: Other abnormalities of gait and mobility (R26.89);Pain;History of falling (Z91.81);Muscle weakness (generalized) (M62.81) Pain - Right/Left: Left Pain - part of body: Shoulder                Time: [id number] OT Time Calculation (min): 14 min Charges:  OT General Charges $OT Visit: 1 Visit OT Evaluation $OT Eval Moderate Complexity: 1 Mod  Gabbi Whetstone P MS, OTR/L 09/13/2019, 8:44 AM

## 2019-09-13 NOTE — H&P (View-Only) (Signed)
[facility]   MULTIDISCIPLINARY HEART VALVE TEAM  Inpatient PFO Consultation:   Patient ID: [name] [name]; [id number]; 1950-08-13   Admit date: 09/11/2019 Date of Consult: 09/13/2019  Primary Care Provider: Susy Frizzle, MD Primary Cardiologist: has seen Dr. Percival Spanish remotely   Patient Profile:   [name] [name] is a 69 y.o. female with a hx of CAD, CKD stage II, longstanding tobacco abuse (recently quit), COPD with chronic hypoxemic respiratory failure on 3 L oxygen at home at night, type 2 diabetes mellitus, recent shoulder fracture nonsurgical managed with a sling, hypertension, hyperlipidemia, thoracic aortic aneurysm, DVT/PE, recurrent CVAs and possible PFO who is being seen today for the evaluation of PFO closure at the request of Dr. Leonie [name].  History of Present Illness:   [name] lives independently in [place], [place]. She has one son that lives across the street from her and they are very close.   Patient was admitted 1/7-08/20/2019 for acute CVA.  MRI showed watershed infarcts in bilateral hemispheres.  CT angio head and neck revealed 50% stenosis of the proximal R ICA due to bulky atherosclerotic calcification.  Carotid Dopplers showed 1-39% ICA stenosis.  Echocardiogram showed normal EF with no acute findings.  She was discharged on dual antiplatelet therapy with aspirin and Plavix for 3 months and then Plavix alone.  She was seen by her PCP on 08/23/2019 for post hospital evaluation.  She had worsening shortness of breath and CT angio of the chest showed acute bilateral segmental and subsegmental pulmonary embolism, stable dilated main pulmonary artery, suggesting chronic pulmonary arterial hypertension and stable ectatic 4.0 cm ascending thoracic aorta as well as three-vessel coronary atherosclerosis.  Subsequent lower extremity duplex showed right lower extremity DVT.  Patient was started on Xarelto.  Outpatient transcranial Doppler showed a weakly positive  bubble study that was felt to be clinically insignificant.  Patient then presented to the emergency department on 09/07/2019 for left-sided weakness.  CT head as well as MRI of the brain showed evolution of old strokes with no new infarcts and the patient was subsequently discharged home.  She represented to the emergency department on 09/11/2019 with increasing left-sided weakness as well as hypotension.  She received a fluid bolus.  MRI revealed continued interval evolution of previously seen subacute infarct with new scattered acute ischemic watershed type infarctions involving the bilateral cerebral hemisphere, right greater than left.  There was associated petechial hemorrhage without frank hemorrhagic transformation.  There was a small volume subarachnoid hemorrhage layering across the cortical sulcus within the right frontal lobe, new from previous tracings. Xarelto switched to Eliquis.  Hypercoagulable panel is in process.  Given patient's recurrent CVAs and failure of anticoagulation, the structural heart team is consulted for evaluation of PFO and potential for transcatheter closure.  She is currently sitting up in bed. No CP or SOB. No LE edema, orthopnea or PND. No dizziness or syncope. No blood in stool or urine. No palpitations.  Still has residual left sided weakness, mostly in her leg.     Past Medical History:  Diagnosis Date  . Aneurysm, thoracic aortic ([place])   . Anxiety   . Arthritis   . CAD (coronary artery disease)   . Chronic kidney disease    Renal insufficiency  . COPD (chronic obstructive pulmonary disease) ([place])   . Depression   . Diabetes mellitus   . Diverticulosis of colon (without mention of hemorrhage)   . Duodenitis without mention of hemorrhage   . Embolic stroke (  [facility])   . GERD (gastroesophageal reflux disease)   . Hemorrhoids   . Hypercholesteremia   . Hypertension   . Insomnia   . Pulmonary embolism ([place])   . Tinnitus   . Vertigo     Past Surgical  History:  Procedure Laterality Date  . ANGIOPLASTY    . CHOLECYSTECTOMY    . ROTATOR CUFF REPAIR Right 04/2012  . TUBAL LIGATION       Inpatient Medications: Scheduled Meds: .  stroke: mapping our early stages of recovery book   Does not apply Once  . apixaban  5 mg Oral BID  . atorvastatin  20 mg Oral QHS  . diltiazem  120 mg Oral Daily  . furosemide  40 mg Oral Daily  . insulin aspart  0-5 Units Subcutaneous QHS  . insulin aspart  0-9 Units Subcutaneous TID WC  . losartan  100 mg Oral Daily  . rOPINIRole  2 mg Oral TID   Continuous Infusions:  PRN Meds: acetaminophen **OR** acetaminophen (TYLENOL) oral liquid 160 mg/5 mL **OR** acetaminophen, diazepam, HYDROcodone-acetaminophen, ipratropium-albuterol, senna-docusate  Allergies:    Allergies  Allergen Reactions  . Guaifenesin & Derivatives Anaphylaxis    Severe Reaction  . Tape Other (See Comments)    TAPE PEELS OFF THE SKIN!!!!!!!  . Benazepril Cough  . Morphine Nausea Only  . Zyrtec [Cetirizine] Swelling and Other (See Comments)    Hands and feet became swollen  . Latex Rash and Other (See Comments)    No powdered gloves!!    Social History:   Social History   Socioeconomic History  . Marital status: Widowed    Spouse name: Not on file  . Number of children: 1  . Years of education: Not on file  . Highest education level: Not on file  Occupational History  . Occupation: Retired  Tobacco Use  . Smoking status: Former Smoker    Types: Cigarettes    Quit date: 08/12/2018    Years since quitting: 1.0  . Smokeless tobacco: Never Used  Substance and Sexual Activity  . Alcohol use: No    Alcohol/week: 0.0 standard drinks  . Drug use: No  . Sexual activity: Not on file  Other Topics Concern  . Not on file  Social History Narrative  . Not on file   Social Determinants of Health   Financial Resource Strain:   . Difficulty of Paying Living Expenses: Not on file  Food Insecurity:   . Worried About Paediatric nurse in the Last Year: Not on file  . Ran Out of Food in the Last Year: Not on file  Transportation Needs: No Transportation Needs  . Lack of Transportation (Medical): No  . Lack of Transportation (Non-Medical): No  Physical Activity:   . Days of Exercise per Week: Not on file  . Minutes of Exercise per Session: Not on file  Stress:   . Feeling of Stress : Not on file  Social Connections:   . Frequency of Communication with Friends and Family: Not on file  . Frequency of Social Gatherings with Friends and Family: Not on file  . Attends Religious Services: Not on file  . Active Member of Clubs or Organizations: Not on file  . Attends Archivist Meetings: Not on file  . Marital Status: Not on file  Intimate Partner Violence:   . Fear of Current or Ex-Partner: Not on file  . Emotionally Abused: Not on file  . Physically Abused: Not on file  .  Sexually Abused: Not on file    Family History:   The patient's family history includes Breast cancer in her sister; Colon polyps in her father and sister; Diabetes in her brother and mother; Heart disease in her father; Hemophilia in her mother.  ROS:  Please see the history of present illness.  ROS  All other ROS reviewed and negative.    Physical Exam/Data:   Vitals:   09/12/19 2045 09/13/19 0024 09/13/19 0327 09/13/19 0739  BP: (!) 147/51 (!) 180/73 (!) 173/69 92/65  Pulse:  84 81 78  Resp: 18 18 18 20   Temp: 97.8 F (36.6 C) (!) 97.5 F (36.4 C) 98 F (36.7 C) (!) 97.5 F (36.4 C)  TempSrc: Oral Oral Oral Oral  SpO2:  94% 93% 92%  Weight:      Height:        Intake/Output Summary (Last 24 hours) at 09/13/2019 [id number] Last data filed at 09/13/2019 0323 Gross per 24 hour  Intake 1059.23 ml  Output 1250 ml  Net -190.77 ml   Filed Weights   09/11/19 1427 09/12/19 1110  Weight: 87.5 kg 91 kg   Body mass index is 34.44 kg/m.  General:  Well nourished, well developed, in no acute distress HEENT: normal Lymph:  no adenopathy Neck: no JVD Endocrine:  No thryoid Cardiac:  normal S1, S2; RRR; no murmur  Lungs:  clear to auscultation bilaterally, no wheezing, rhonchi or rales  Abd: soft, nontender, no hepatomegaly  Ext: no edema Musculoskeletal:  No deformities, BUE and BLE strength normal and equal Skin: warm and dry  Neuro:  CNs 2-12 intact. Left sided weakness, mostly in left lower extremity Psych:  Normal affect   EKG:  The EKG was personally reviewed and demonstrates: sinus, HR 92. Septal Q waves Telemetry:  Telemetry was personally reviewed and demonstrates: sinus with no afib  Relevant CV Studies: Echo 08/19/19 IMPRESSIONS 1. Left ventricular ejection fraction, by visual estimation, is 60 to 65%. The left ventricle has normal function. There is mildly increased  left ventricular hypertrophy.  2. Global right ventricle has normal systolic function.The right ventricular size is normal.  3. Left atrial size was normal.  4. Right atrial size was normal.  5. Mild mitral annular calcification.  6. The mitral valve is abnormal. Trivial mitral valve regurgitation.  7. The tricuspid valve is normal in structure.  8. The aortic valve was not well visualized. Aortic valve regurgitation  is not visualized. No evidence of aortic valve stenosis.  9. The pulmonic valve was not well visualized. Pulmonic valve  regurgitation is not visualized.  10. Trivial pericardial effusion  11. TR signal is inadequate for assessing pulmonary artery systolic  pressure.  12. The inferior vena cava is normal in size with greater than 50% respiratory variability, suggesting right atrial pressure of 3 mmHg.  __________________  Transcranial doppler: 08/30/19 Summary:   A vascular evaluation was performed. The right middle cerebral artery was  studied. An IV was inserted into the patient's right hand . Verbal  informed consent was obtained.  HITS heard at rest: 1-2 Clinically insignificant.  Weakly positive  TCD Bubble study indiicative of a small likely clinically insignificant right to left shunt   __________________  Carotid dopplers: 08/19/19 Summary:  Right Carotid: Velocities in the right ICA are consistent with a 1-39% stenosis.  Left Carotid: Velocities in the left ICA are consistent with a 1-39% stenosis. Diminished proximal CCA velocities, along with retrograde  ECA is suggestive of possible proximal stenosis.  Vertebrals: Left vertebral artery demonstrates antegrade flow. Right  vertebral artery demonstrates retrograde flow. This, along with atypical right subclavian artery velocity, and atherosclerosis of the  aortic  arch and proximal branches seen on CTA, is consistent with  possibe proximal stenosis.  Subclavians: Right subclavian artery was abnormal. Normal flow hemodynamics were  seen in the left subclavian artery.    Laboratory Data:  Chemistry Recent Labs  Lab 09/07/19 1601 09/07/19 1601 09/07/19 1623 09/07/19 1641 09/11/19 1556  NA 136   < > 137 138 141  K 2.9*   < > 3.3* 3.2* 3.8  CL 99   < > 99 96* 106  CO2 24  --  25  --  25  GLUCOSE 151*   < > 142* 142* 124*  BUN 18   < > 17 19 12   CREATININE 0.96   < > 0.95 0.90 0.79  CALCIUM 8.9  --  9.1  --  9.4  GFRNONAA 60*  --  >60  --  >60  GFRAA >60  --  >60  --  >60  ANIONGAP 13  --  13  --  10   < > = values in this interval not displayed.    Recent Labs  Lab 09/07/19 1623 09/11/19 1556  PROT 6.9 6.7  ALBUMIN 3.5 3.3*  AST 28 28  ALT 30 30  ALKPHOS 49 49  BILITOT 0.2* 0.2*   Hematology Recent Labs  Lab 09/07/19 1601 09/07/19 1601 09/07/19 1623 09/07/19 1641 09/11/19 1556  WBC 11.7*  --  12.0*  --  9.9  RBC 4.50  --  4.47  --  4.31  HGB 12.7   < > 12.8 13.3 12.3  HCT 40.5   < > 39.8 39.0 38.2  MCV 90.0  --  89.0  --  88.6  MCH 28.2  --  28.6  --  28.5  MCHC 31.4  --  32.2  --  32.2  RDW 15.6*  --  15.4  --  15.3  PLT 383  --  340  --  315   < > = values in this interval not displayed.     Cardiac EnzymesNo results for input(s): TROPONINI in the last 168 hours. No results for input(s): TROPIPOC in the last 168 hours.  BNPNo results for input(s): BNP, PROBNP in the last 168 hours.  DDimer No results for input(s): DDIMER in the last 168 hours.  Radiology/Studies:  MR BRAIN WO CONTRAST  Result Date: 09/11/2019 CLINICAL DATA:  Initial evaluation for acute difficulty walking, stroke suspected. EXAM: MRI HEAD WITHOUT CONTRAST TECHNIQUE: Multiplanar, multiecho pulse sequences of the brain and surrounding structures were obtained without intravenous contrast. COMPARISON:  Comparison made with multiple recent brain MRIs, most recent of which from 09/07/2019. FINDINGS: Brain: Generalized age-related cerebral atrophy with chronic microvascular ischemic disease again noted. Multiple remote lacunar infarcts present within the hemispheric cerebral white matter. Continued interval evolution of previously seen subacute watershed type infarcts involving the bilateral cerebral hemispheres. However, multiple new watershed type infarcts are now seen on today's exam, increased in size and number but with similar distribution as compared to previous. Overall, involvement is slightly worse within the right cerebral hemisphere as compared to the left. New patchy involvement of the bilateral parieto-occipital regions as well, also greater on the right (series 5, image 66). Now seen are a few scattered foci of susceptibility artifact associated with these infarcts, consistent with associated petechial hemorrhage (series 14, image 46). Additionally, there is a  serpiginous focus of FLAIR and susceptibility artifact seen layering along a cortical sulcus within the right frontal lobe, concerning for a small volume subarachnoid hemorrhage, new from previous (series 14, image 46). No frank intraparenchymal hematoma. No significant mass effect. No mass lesion or midline shift. No hydrocephalus. No extra-axial fluid  collection. Pituitary gland suprasellar region normal. Midline structures intact. Vascular: Major intracranial vascular flow voids are maintained. Skull and upper cervical spine: Craniocervical junction normal. Upper cervical spine within normal limits. Bone marrow signal intensity normal. No scalp soft tissue abnormality. Sinuses/Orbits: Globes and orbital soft tissues within normal limits. Paranasal sinuses remain largely clear. No significant mastoid effusion. Other: None. IMPRESSION: 1. Continued interval evolution of previously seen subacute infarcts, with multiple new scattered acute ischemic watershed type infarcts involving the bilateral cerebral hemispheres, right greater than left. Associated petechial hemorrhage about a few of these infarcts without frank hemorrhagic transformation. 2. Small volume subarachnoid hemorrhage layering along the cortical sulcus within the right frontal lobe, new from previous. 3. Otherwise stable exam with underlying cerebral atrophy, chronic microvascular ischemic disease, and multiple remote lacunar infarcts involving the hemispheric cerebral white matter. Critical Value/emergent results were called by telephone at the time of interpretation on 09/11/2019 at 8:43 pm to provider [facility] , who verbally acknowledged these results. Electronically Signed   By: Jeannine Boga M.D.   On: 09/11/2019 20:43    Assessment and Plan:    Risk of Paradoxical Embolism (RoPE) score No history of HTN      =    1 No history of DM 2     =    1 No h/o CVA/TIA         =    1 Nonsmoker                =    1 Cortical infarct on imaging  =    1 Age, years 42 to 19   =    5 75 to 44   =    4 40 to 30   =    3 50 to 16   =    2 60 to 5   =    1 > 70    =    0  RoPE score is total sum of individual points (higher total is LESS risk of stroke)   RoPE score   Estimated 2 year stroke/TIA recurrence rate (Kaplan-Meier), percent (95% CI) 0 to 3    20 (12 to 28%) 4    12  (6 to 18%) 5    7 (3 to 11%) 6    8 (4 to 12%) 7    6 (2 to 10%) 8    6 (2 to 10%) 9, 10    2 (0 to 4%)  RoPE score   PFO-attributable fraction, percent (95% CI) 0 to 3    0 (0 to 4%) 4    38 (25 to 48%) 5    34 (21 to 45%) 6    62 (54 to 68%) 7    72 (66 to 76%) 8    84 (79 to 87%) 9, 10    88 (83 to 91%)    For this patients PFO, there is a low likelihood of contribution to stroke/TIA with a RoPE score of 0 to 3.  Above score calculated as 1 point each if NOT present [HTN, DM2, h/o CVA/TIA, Smoker, Cortical imaging on infarct] Above score calculated as 5 points if [  Age 68 to 29], 4 points if [Age 56 to 39], 3 points if [Age 52 to 49], 2 points if [Age 51 to 59], 1 point if [Age 10 to 69], 0 points if [Age > 70]  RoPE score is total sum of individual points (higher total is LESS risk of stroke)  [place] has a rope score of 1 due to cortical infarct on imaging corresponding to a 0-4% PFO attributable CVA. Although the patient has a low rope score, indicating that the likelihood of a PFO related stroke is low, she has had multiple recurrent strokes despite anticoagulation and had a weakly positive transcranial Doppler.  Will proceed with TEE tomorrow, scheduled at 1:00 PM for further evaluation of potential PFO.  If PFO larger than anticipated or has high risk anatomy with an atrial septal aneurysm, we may consider percutaneous transcatheter PFO closure.  After careful review of history and examination, the risks and benefits of transesophageal echocardiogram have been explained including risks of esophageal damage, perforation (1:10,000 risk), bleeding, pharyngeal hematoma as well as other potential complications associated with conscious sedation including aspiration, arrhythmia, respiratory failure and death. Alternatives to treatment were discussed, questions were answered. Patient is willing to proceed.    Dr. Burt Knack to follow.   Signed, Angelena Form, PA-C  09/13/2019 9:22  AM  Patient seen, examined. Available data reviewed. Agree with findings, assessment, and plan as outlined by Nell Range, PA-C.  The patient is independently interviewed and examined.  Her son is at the bedside.  She is a delightful 69 year old woman resting comfortably in no acute distress.  HEENT is normal, JVP is normal, there are no carotid bruits, lung fields are clear, heart is regular rate and rhythm with a soft ejection murmur at the right upper sternal border.  There is no diastolic murmur.  Abdomen is soft and nontender.  Extremities have no pretibial edema.  Skin is warm and dry without rash.  I have reviewed extensive data from her recent hospitalizations for recurrent strokes.  She has undergone a 2D echo study that showed no significant cardiac abnormality with normal valve function and normal LV/RV function.  There was a transcranial Doppler study done that demonstrates trivial right to left shunt at rest and small shunt with Valsalva, possibly suggestive of a small PFO.  The patient has a documented DVT, age-indeterminate, in the right peroneal veins and also has acute segmental and subsegmental pulmonary embolus without evidence of right heart strain.  She has moderate plaque noted at the right carotid bulb without high-grade obstruction.  She has had recurrent stroke on anticoagulation.  I agree that it is reasonable to proceed with transesophageal echo to better define her interatrial septal anatomy and make sure that she does not have a high anatomic risk PFO with either a large defect or an interatrial septal aneurysm.  If only a small PFO is present, I would be inclined to continue with medical therapy considering her age and comorbid conditions. I have reviewed risks/indications/alternatives to transesophageal echo with the patient and her son. They understand and agree to proceed.  Sherren Mocha, M.D. 09/13/2019 3:08 PM

## 2019-09-13 NOTE — Progress Notes (Signed)
Physical Therapy Treatment Patient Details Name: [name] [name] MRN: [id number] DOB: [birth year] Today's Date: 09/13/2019    History of Present Illness [name] [name] is a 69 y.o. female with medical history significant of CAD, CKD stage II, COPD/chronic hypoxemic respiratory failure on 3 L home oxygen at night, type 2 diabetes, hypertension, hyperlipidemia, thoracic aortic aneurysm, and recently admitted 1/7-1/9 for acute CVA with MRI showing watershed infarcts in bilateral hemispheres.  Dopplers done 1/19 revealed right lower extremity DVT and CT angiogram chest done 1/12 revealed acute bilateral segmental and subsegmental pulmonary emboli.  Seen in ED 1/27 for new weakness, but negative work up so d/c.  Admitted 1/31 with L side weakness; MRI brain done today showing continued interval evolution of previously seen subacute infarcts with multiple new scattered acute ischemic watershed type infarcts involving the bilateral cerebral hemispheres, right greater than left.  Associated petechial hemorrhage about a few of these infarcts without frank hemorrhagic transformation.  Small volume subarachnoid hemorrhage layering along the cortical sulcus within the right frontal lobe, new from previous.    PT Comments    Patient is highly motivated to participate in therapy and making steady progress with therapy. She was able to increase gait distance today in hospital room. Patient required min assist to steady with RW for gait and verbal/visual cues to facilitate Lt foot position/step length. Pt required assist for Lt foot positioning ~50% of time. Seated rest provided in the middle of gait due to fatigue and Lt shoulder pain. She will benefit from continued skilled PT interventions in CIR setting in preparation for safe return home.   Follow Up Recommendations  CIR     Equipment Recommendations  None recommended by PT    Recommendations for Other Services Rehab consult     Precautions /  Restrictions Precautions Precautions: Fall Precaution Comments: L Humeral fx Restrictions Weight Bearing Restrictions: No    Mobility  Bed Mobility Overal bed mobility: Needs Assistance Bed Mobility: Supine to Sit     Supine to sit: HOB elevated;Min assist Sit to supine: Min assist   General bed mobility comments: cues to use rail, assist for trunk, increased time scooting to EOB  Transfers Overall transfer level: Needs assistance Equipment used: Rolling walker (2 wheeled) Transfers: Sit to/from [facility] Sit to Stand: Mod assist;Min assist Stand pivot transfers: Min assist       General transfer comment: cues for safe hand placement and technique to initaite power up and rise to RW. HHA/support provided at Lt UE to raise it to the RW.  Ambulation/Gait Ambulation/Gait assistance: Min assist Gait Distance (Feet): 20 Feet(20 ft total (1x13', 1x7')) Assistive device: Rolling walker (2 wheeled) Gait Pattern/deviations: Step-to pattern;Decreased stride length;Decreased dorsiflexion - left;Scissoring;Step-through pattern Gait velocity: decreased   General Gait Details: visual cues provided for Lt foot step length/foot placement; pt continues with scissoring step pattern and required min assist ~ 50% of time for Lt foot positioning. pt ambulated 2 bouts in hospital room with seated rest break in recliner between bouts due to fatigue and Lt shoulder pain.   Stairs             Wheelchair Mobility    Modified Rankin (Stroke Patients Only) Modified Rankin (Stroke Patients Only) Pre-Morbid Rankin Score: Moderate disability Modified Rankin: Moderately severe disability     Balance Overall balance assessment: Needs assistance Sitting-balance support: Feet supported Sitting balance-Leahy Scale: Fair     Standing balance support: Bilateral upper extremity supported;During functional activity Standing balance-Leahy Scale:  Poor Standing balance comment:  relaint on UE support for balance                 Cognition Arousal/Alertness: Awake/alert Behavior During Therapy: WFL for tasks assessed/performed Overall Cognitive Status: Within Functional Limits for tasks assessed             Exercises      General Comments        Pertinent Vitals/Pain Pain Assessment: Faces Pain Score: 0-No pain Faces Pain Scale: Hurts little more Pain Location: L shoulder Pain Descriptors / Indicators: Aching;Sore Pain Intervention(s): Monitored during session;Repositioned    Home Living Family/patient expects to be discharged to:: Private residence Living Arrangements: Alone Available Help at Discharge: Family Type of Home: House Home Access: Ramped entrance   Home Layout: One level Home Equipment: Environmental consultant - 2 wheels;Wheelchair - manual;Shower seat;Grab bars - toilet;Grab bars - tub/shower Additional Comments: 38 y/o granddaughter helps during the day and son and daughter in law help at night and weekends per pt report.    Prior Function Level of Independence: Needs assistance  Gait / Transfers Assistance Needed: using walker occasionally, but got worse over day and half pta ADL's / Homemaking Assistance Needed: son helped with shower Comments: drives and does cooking and cleaning, still doing it since shoulder injury, just lost husband in July due to suicide   PT Goals (current goals can now be found in the care plan section) Acute Rehab PT Goals Patient Stated Goal: wants to go to rehab Time For Goal Achievement: 09/26/19 Potential to Achieve Goals: Good Progress towards PT goals: Progressing toward goals    Frequency    Min 4X/week      PT Plan Current plan remains appropriate       AM-PAC PT "6 Clicks" Mobility   Outcome Measure  Help needed turning from your back to your side while in a flat bed without using bedrails?: A Little Help needed moving from lying on your back to sitting on the side of a flat bed without  using bedrails?: A Little Help needed moving to and from a bed to a chair (including a wheelchair)?: A Little Help needed standing up from a chair using your arms (e.g., wheelchair or bedside chair)?: A Little Help needed to walk in hospital room?: A Little Help needed climbing 3-5 steps with a railing? : A Lot 6 Click Score: 17    End of Session Equipment Utilized During Treatment: Gait belt Activity Tolerance: Patient tolerated treatment well Patient left: in chair;with call bell/phone within reach;with chair alarm set Nurse Communication: Mobility status PT Visit Diagnosis: Other abnormalities of gait and mobility (R26.89);Hemiplegia and hemiparesis Hemiplegia - Right/Left: Left Hemiplegia - dominant/non-dominant: Non-dominant Hemiplegia - caused by: Cerebral infarction     Time: [id number] PT Time Calculation (min) (ACUTE ONLY): 38 min  Charges:  $Gait Training: 8-22 mins $Therapeutic Activity: 23-37 mins                     Verner Mould, DPT Physical Therapist with [facility] [phone]  09/13/2019 12:34 PM

## 2019-09-13 NOTE — Consult Note (Signed)
   [facility] CM Inpatient Consult   09/13/2019  [name] [name] Jan 19, 1951 [id number]    THN: ACTIVE status:   Patient is currently active with [facility]) Care Management services. Alerted of admission by [facility] RN CM coordinator who is currently engaged with patient forEMMI Stroke follow-up calls. She is also engaged with [facility] SW (Forensic scientist).     Our community based plan of care has focused on disease management and community resource support.  Patientchecked for30 day readmission with 2 hospitalizations and an ED visit in the past 6 months and has 17%risk score for unplanned readmission. Patient is under the Medicare/ [employer] plan. Patient will receive a post hospital call and will be evaluated for assessments and disease process education if transitioning to home.  Primary Hansen with [facility].  Review of medical record shows MDbrief narrative as: [name] Daywalt Wrightis a 69 y.o.femalewith PMH significant for HTN, HLD, T2DM, CAD, COPD on 3 L oxygen at home, a recent shoulder fracture, non-surgically managed with sling and pain medications. Recently hospitalized 1/7-08/20/19 for acute bilateral strokes.  Patient presented to the ED again on 09/11/19 with worsening left-sided weakness, after seen and evaluated in the ED on 1/27. [Acute right parietal occipital lobe infarcts, recent pulmonary embolism/lower leg DVT, blood pressure fluctuation] Per chart review, patient lives alone/ independently and has one son that lives across the street from her.  PT/ OT evaluation notes showing current recommendation for CIR level rehab (Comprehensive Inpatient Rehab) to allow return to home. .  Plan: Will follow progress and disposition plan with inpatient transition of care team. Will update [facility])RN care management coordinator of discharge disposition.   For questions oradditional  information,please contact:  Corrado Hymon A. Madina Galati, BSN, RN-BC [facility] Liaison Cell: [phone]

## 2019-09-14 ENCOUNTER — Inpatient Hospital Stay (HOSPITAL_COMMUNITY)
Admission: RE | Admit: 2019-09-14 | Discharge: 2019-10-07 | DRG: 057 | Disposition: A | Discharge status: Skilled Nursing Facility | Payer: Medicare Other | Source: Intra-hospital | Attending: Physical Medicine & Rehabilitation | Admitting: Physical Medicine & Rehabilitation

## 2019-09-14 ENCOUNTER — Encounter (HOSPITAL_COMMUNITY)
Admission: EM | Disposition: A | Discharge status: Rehabilitation Facility | Payer: Self-pay | Source: Home / Self Care | Attending: Internal Medicine

## 2019-09-14 ENCOUNTER — Other Ambulatory Visit: Payer: Self-pay

## 2019-09-14 ENCOUNTER — Inpatient Hospital Stay (HOSPITAL_COMMUNITY): Payer: Medicare Other | Admitting: Certified Registered Nurse Anesthetist

## 2019-09-14 ENCOUNTER — Inpatient Hospital Stay (HOSPITAL_COMMUNITY): Payer: Medicare Other

## 2019-09-14 ENCOUNTER — Encounter (HOSPITAL_COMMUNITY): Payer: Self-pay | Admitting: Physical Medicine & Rehabilitation

## 2019-09-14 ENCOUNTER — Encounter (HOSPITAL_COMMUNITY): Payer: Self-pay | Admitting: Internal Medicine

## 2019-09-14 DIAGNOSIS — R1311 Dysphagia, oral phase: Secondary | ICD-10-CM | POA: Diagnosis not present

## 2019-09-14 DIAGNOSIS — Q2112 Patent foramen ovale: Secondary | ICD-10-CM

## 2019-09-14 DIAGNOSIS — I6340 Cerebral infarction due to embolism of unspecified cerebral artery: Secondary | ICD-10-CM | POA: Diagnosis not present

## 2019-09-14 DIAGNOSIS — E119 Type 2 diabetes mellitus without complications: Secondary | ICD-10-CM | POA: Diagnosis not present

## 2019-09-14 DIAGNOSIS — Z8673 Personal history of transient ischemic attack (TIA), and cerebral infarction without residual deficits: Secondary | ICD-10-CM | POA: Diagnosis not present

## 2019-09-14 DIAGNOSIS — E46 Unspecified protein-calorie malnutrition: Secondary | ICD-10-CM | POA: Diagnosis not present

## 2019-09-14 DIAGNOSIS — E1122 Type 2 diabetes mellitus with diabetic chronic kidney disease: Secondary | ICD-10-CM | POA: Diagnosis present

## 2019-09-14 DIAGNOSIS — M6259 Muscle wasting and atrophy, not elsewhere classified, multiple sites: Secondary | ICD-10-CM | POA: Diagnosis not present

## 2019-09-14 DIAGNOSIS — Z91040 Latex allergy status: Secondary | ICD-10-CM

## 2019-09-14 DIAGNOSIS — E1169 Type 2 diabetes mellitus with other specified complication: Secondary | ICD-10-CM | POA: Diagnosis not present

## 2019-09-14 DIAGNOSIS — Z86711 Personal history of pulmonary embolism: Secondary | ICD-10-CM

## 2019-09-14 DIAGNOSIS — S4292XD Fracture of left shoulder girdle, part unspecified, subsequent encounter for fracture with routine healing: Secondary | ICD-10-CM | POA: Diagnosis not present

## 2019-09-14 DIAGNOSIS — Q211 Atrial septal defect: Secondary | ICD-10-CM

## 2019-09-14 DIAGNOSIS — F419 Anxiety disorder, unspecified: Secondary | ICD-10-CM | POA: Diagnosis present

## 2019-09-14 DIAGNOSIS — G2581 Restless legs syndrome: Secondary | ICD-10-CM | POA: Diagnosis present

## 2019-09-14 DIAGNOSIS — I639 Cerebral infarction, unspecified: Secondary | ICD-10-CM

## 2019-09-14 DIAGNOSIS — Z803 Family history of malignant neoplasm of breast: Secondary | ICD-10-CM

## 2019-09-14 DIAGNOSIS — I82409 Acute embolism and thrombosis of unspecified deep veins of unspecified lower extremity: Secondary | ICD-10-CM | POA: Diagnosis present

## 2019-09-14 DIAGNOSIS — I6521 Occlusion and stenosis of right carotid artery: Secondary | ICD-10-CM | POA: Diagnosis not present

## 2019-09-14 DIAGNOSIS — Z888 Allergy status to other drugs, medicaments and biological substances status: Secondary | ICD-10-CM

## 2019-09-14 DIAGNOSIS — Z6834 Body mass index (BMI) 34.0-34.9, adult: Secondary | ICD-10-CM

## 2019-09-14 DIAGNOSIS — M1990 Unspecified osteoarthritis, unspecified site: Secondary | ICD-10-CM | POA: Diagnosis present

## 2019-09-14 DIAGNOSIS — E785 Hyperlipidemia, unspecified: Secondary | ICD-10-CM | POA: Diagnosis present

## 2019-09-14 DIAGNOSIS — J449 Chronic obstructive pulmonary disease, unspecified: Secondary | ICD-10-CM

## 2019-09-14 DIAGNOSIS — Z7901 Long term (current) use of anticoagulants: Secondary | ICD-10-CM | POA: Diagnosis not present

## 2019-09-14 DIAGNOSIS — I2510 Atherosclerotic heart disease of native coronary artery without angina pectoris: Secondary | ICD-10-CM | POA: Diagnosis present

## 2019-09-14 DIAGNOSIS — G4700 Insomnia, unspecified: Secondary | ICD-10-CM | POA: Diagnosis present

## 2019-09-14 DIAGNOSIS — K219 Gastro-esophageal reflux disease without esophagitis: Secondary | ICD-10-CM | POA: Diagnosis present

## 2019-09-14 DIAGNOSIS — Z87891 Personal history of nicotine dependence: Secondary | ICD-10-CM

## 2019-09-14 DIAGNOSIS — Z7902 Long term (current) use of antithrombotics/antiplatelets: Secondary | ICD-10-CM

## 2019-09-14 DIAGNOSIS — Z885 Allergy status to narcotic agent status: Secondary | ICD-10-CM

## 2019-09-14 DIAGNOSIS — G8111 Spastic hemiplegia affecting right dominant side: Secondary | ICD-10-CM

## 2019-09-14 DIAGNOSIS — N189 Chronic kidney disease, unspecified: Secondary | ICD-10-CM | POA: Diagnosis not present

## 2019-09-14 DIAGNOSIS — R569 Unspecified convulsions: Secondary | ICD-10-CM | POA: Diagnosis not present

## 2019-09-14 DIAGNOSIS — R41841 Cognitive communication deficit: Secondary | ICD-10-CM | POA: Diagnosis not present

## 2019-09-14 DIAGNOSIS — Z9861 Coronary angioplasty status: Secondary | ICD-10-CM

## 2019-09-14 DIAGNOSIS — N182 Chronic kidney disease, stage 2 (mild): Secondary | ICD-10-CM

## 2019-09-14 DIAGNOSIS — E876 Hypokalemia: Secondary | ICD-10-CM | POA: Diagnosis not present

## 2019-09-14 DIAGNOSIS — I10 Essential (primary) hypertension: Secondary | ICD-10-CM

## 2019-09-14 DIAGNOSIS — I2609 Other pulmonary embolism with acute cor pulmonale: Secondary | ICD-10-CM | POA: Diagnosis not present

## 2019-09-14 DIAGNOSIS — G459 Transient cerebral ischemic attack, unspecified: Secondary | ICD-10-CM | POA: Diagnosis not present

## 2019-09-14 DIAGNOSIS — Z86718 Personal history of other venous thrombosis and embolism: Secondary | ICD-10-CM

## 2019-09-14 DIAGNOSIS — M1380 Other specified arthritis, unspecified site: Secondary | ICD-10-CM | POA: Diagnosis not present

## 2019-09-14 DIAGNOSIS — N200 Calculus of kidney: Secondary | ICD-10-CM | POA: Diagnosis not present

## 2019-09-14 DIAGNOSIS — I2699 Other pulmonary embolism without acute cor pulmonale: Secondary | ICD-10-CM | POA: Diagnosis not present

## 2019-09-14 DIAGNOSIS — I712 Thoracic aortic aneurysm, without rupture: Secondary | ICD-10-CM | POA: Diagnosis present

## 2019-09-14 DIAGNOSIS — I69054 Hemiplegia and hemiparesis following nontraumatic subarachnoid hemorrhage affecting left non-dominant side: Secondary | ICD-10-CM | POA: Diagnosis not present

## 2019-09-14 DIAGNOSIS — F329 Major depressive disorder, single episode, unspecified: Secondary | ICD-10-CM | POA: Diagnosis present

## 2019-09-14 DIAGNOSIS — E7800 Pure hypercholesterolemia, unspecified: Secondary | ICD-10-CM | POA: Diagnosis present

## 2019-09-14 DIAGNOSIS — E1165 Type 2 diabetes mellitus with hyperglycemia: Secondary | ICD-10-CM

## 2019-09-14 DIAGNOSIS — I69352 Hemiplegia and hemiparesis following cerebral infarction affecting left dominant side: Secondary | ICD-10-CM | POA: Diagnosis not present

## 2019-09-14 DIAGNOSIS — E669 Obesity, unspecified: Secondary | ICD-10-CM | POA: Diagnosis present

## 2019-09-14 DIAGNOSIS — R414 Neurologic neglect syndrome: Secondary | ICD-10-CM | POA: Diagnosis not present

## 2019-09-14 DIAGNOSIS — R0989 Other specified symptoms and signs involving the circulatory and respiratory systems: Secondary | ICD-10-CM | POA: Diagnosis not present

## 2019-09-14 DIAGNOSIS — J9611 Chronic respiratory failure with hypoxia: Secondary | ICD-10-CM | POA: Diagnosis present

## 2019-09-14 DIAGNOSIS — F338 Other recurrent depressive disorders: Secondary | ICD-10-CM | POA: Diagnosis not present

## 2019-09-14 DIAGNOSIS — Z9981 Dependence on supplemental oxygen: Secondary | ICD-10-CM

## 2019-09-14 DIAGNOSIS — K5700 Diverticulitis of small intestine with perforation and abscess without bleeding: Secondary | ICD-10-CM | POA: Diagnosis not present

## 2019-09-14 DIAGNOSIS — I351 Nonrheumatic aortic (valve) insufficiency: Secondary | ICD-10-CM

## 2019-09-14 DIAGNOSIS — I82451 Acute embolism and thrombosis of right peroneal vein: Secondary | ICD-10-CM | POA: Diagnosis not present

## 2019-09-14 DIAGNOSIS — Z20822 Contact with and (suspected) exposure to covid-19: Secondary | ICD-10-CM | POA: Diagnosis present

## 2019-09-14 DIAGNOSIS — I69354 Hemiplegia and hemiparesis following cerebral infarction affecting left non-dominant side: Secondary | ICD-10-CM | POA: Diagnosis not present

## 2019-09-14 DIAGNOSIS — Z833 Family history of diabetes mellitus: Secondary | ICD-10-CM | POA: Diagnosis not present

## 2019-09-14 DIAGNOSIS — R9401 Abnormal electroencephalogram [EEG]: Secondary | ICD-10-CM | POA: Diagnosis not present

## 2019-09-14 DIAGNOSIS — I129 Hypertensive chronic kidney disease with stage 1 through stage 4 chronic kidney disease, or unspecified chronic kidney disease: Secondary | ICD-10-CM | POA: Diagnosis present

## 2019-09-14 DIAGNOSIS — Z7401 Bed confinement status: Secondary | ICD-10-CM | POA: Diagnosis not present

## 2019-09-14 DIAGNOSIS — Z8371 Family history of colonic polyps: Secondary | ICD-10-CM

## 2019-09-14 DIAGNOSIS — M19012 Primary osteoarthritis, left shoulder: Secondary | ICD-10-CM | POA: Diagnosis present

## 2019-09-14 DIAGNOSIS — Z7984 Long term (current) use of oral hypoglycemic drugs: Secondary | ICD-10-CM

## 2019-09-14 DIAGNOSIS — E871 Hypo-osmolality and hyponatremia: Secondary | ICD-10-CM | POA: Diagnosis not present

## 2019-09-14 DIAGNOSIS — R278 Other lack of coordination: Secondary | ICD-10-CM | POA: Diagnosis not present

## 2019-09-14 DIAGNOSIS — I69318 Other symptoms and signs involving cognitive functions following cerebral infarction: Secondary | ICD-10-CM | POA: Diagnosis not present

## 2019-09-14 DIAGNOSIS — M2550 Pain in unspecified joint: Secondary | ICD-10-CM | POA: Diagnosis not present

## 2019-09-14 DIAGNOSIS — Z91048 Other nonmedicinal substance allergy status: Secondary | ICD-10-CM

## 2019-09-14 DIAGNOSIS — W19XXXA Unspecified fall, initial encounter: Secondary | ICD-10-CM | POA: Diagnosis not present

## 2019-09-14 DIAGNOSIS — I69314 Frontal lobe and executive function deficit following cerebral infarction: Secondary | ICD-10-CM | POA: Diagnosis not present

## 2019-09-14 DIAGNOSIS — I6389 Other cerebral infarction: Secondary | ICD-10-CM | POA: Diagnosis not present

## 2019-09-14 DIAGNOSIS — I609 Nontraumatic subarachnoid hemorrhage, unspecified: Secondary | ICD-10-CM

## 2019-09-14 DIAGNOSIS — Z8249 Family history of ischemic heart disease and other diseases of the circulatory system: Secondary | ICD-10-CM

## 2019-09-14 DIAGNOSIS — R404 Transient alteration of awareness: Secondary | ICD-10-CM | POA: Diagnosis not present

## 2019-09-14 DIAGNOSIS — M6281 Muscle weakness (generalized): Secondary | ICD-10-CM | POA: Diagnosis not present

## 2019-09-14 HISTORY — PX: BUBBLE STUDY: SHX6837

## 2019-09-14 HISTORY — PX: TEE WITHOUT CARDIOVERSION: SHX5443

## 2019-09-14 LAB — CARDIOLIPIN ANTIBODIES, IGG, IGM, IGA
Anticardiolipin IgA: <9 APL U/mL (ref 0–11)
Anticardiolipin IgG: <9 GPL U/mL (ref 0–14)
Anticardiolipin IgM: 16 MPL U/mL — ABNORMAL HIGH (ref 0–12)

## 2019-09-14 LAB — LUPUS ANTICOAGULANT PANEL
DRVVT: 83.9 s — ABNORMAL HIGH (ref 0.0–47.0)
PTT Lupus Anticoagulant: 35.9 s (ref 0.0–51.9)

## 2019-09-14 LAB — CBC WITH DIFFERENTIAL/PLATELET
Abs Immature Granulocytes: 0.06 10*3/uL (ref 0.00–0.07)
Basophils Absolute: 0 10*3/uL (ref 0.0–0.1)
Basophils Relative: 0 %
Eosinophils Absolute: 0.2 10*3/uL (ref 0.0–0.5)
Eosinophils Relative: 3 %
HCT: 36.9 % (ref 36.0–46.0)
Hemoglobin: 11.9 g/dL — ABNORMAL LOW (ref 12.0–15.0)
Immature Granulocytes: 1 %
Lymphocytes Relative: 38 %
Lymphs Abs: 3.4 10*3/uL (ref 0.7–4.0)
MCH: 28.5 pg (ref 26.0–34.0)
MCHC: 32.2 g/dL (ref 30.0–36.0)
MCV: 88.3 fL (ref 80.0–100.0)
Monocytes Absolute: 0.8 10*3/uL (ref 0.1–1.0)
Monocytes Relative: 9 %
Neutro Abs: 4.6 10*3/uL (ref 1.7–7.7)
Neutrophils Relative %: 49 %
Platelets: 300 10*3/uL (ref 150–400)
RBC: 4.18 MIL/uL (ref 3.87–5.11)
RDW: 15.4 % (ref 11.5–15.5)
WBC: 9.2 10*3/uL (ref 4.0–10.5)
nRBC: 0 % (ref 0.0–0.2)

## 2019-09-14 LAB — BASIC METABOLIC PANEL
Anion gap: 11 (ref 5–15)
BUN: 11 mg/dL (ref 8–23)
CO2: 26 mmol/L (ref 22–32)
Calcium: 8.7 mg/dL — ABNORMAL LOW (ref 8.9–10.3)
Chloride: 102 mmol/L (ref 98–111)
Creatinine, Ser: 0.92 mg/dL (ref 0.44–1.00)
GFR calc Af Amer: >60 mL/min (ref >60)
GFR calc non Af Amer: >60 mL/min (ref >60)
Glucose, Bld: 165 mg/dL — ABNORMAL HIGH (ref 70–99)
Potassium: 3.9 mmol/L (ref 3.5–5.1)
Sodium: 139 mmol/L (ref 135–145)

## 2019-09-14 LAB — DRVVT CONFIRM: dRVVT Confirm: 1.5 ratio — ABNORMAL HIGH (ref 0.8–1.2)

## 2019-09-14 LAB — BETA-2-GLYCOPROTEIN I ABS, IGG/M/A
Beta-2 Glyco I IgG: <9 GPI IgG units (ref 0–20)
Beta-2-Glycoprotein I IgA: <9 GPI IgA units (ref 0–25)
Beta-2-Glycoprotein I IgM: 17 GPI IgM units (ref 0–32)

## 2019-09-14 LAB — DRVVT MIX: dRVVT Mix: 72 s — ABNORMAL HIGH (ref 0.0–40.4)

## 2019-09-14 LAB — MAGNESIUM: Magnesium: 2 mg/dL (ref 1.7–2.4)

## 2019-09-14 LAB — PROTEIN C, TOTAL: Protein C, Total: 80 % (ref 60–150)

## 2019-09-14 LAB — GLUCOSE, CAPILLARY
Glucose-Capillary: 149 mg/dL — ABNORMAL HIGH (ref 70–99)
Glucose-Capillary: 153 mg/dL — ABNORMAL HIGH (ref 70–99)
Glucose-Capillary: 138 mg/dL — ABNORMAL HIGH (ref 70–99)

## 2019-09-14 SURGERY — ECHOCARDIOGRAM, TRANSESOPHAGEAL
Anesthesia: Monitor Anesthesia Care

## 2019-09-14 MED ORDER — PROPOFOL 10 MG/ML IV BOLUS
INTRAVENOUS | Status: DC | PRN
  Administered 2019-09-14 (×2): 10 mg via INTRAVENOUS
  Administered 2019-09-14: 20 mg via INTRAVENOUS

## 2019-09-14 MED ORDER — IPRATROPIUM-ALBUTEROL 0.5-2.5 (3) MG/3ML IN SOLN: 3.0000 mL | Freq: Four times a day (QID) | RESPIRATORY_TRACT | Status: DC | PRN

## 2019-09-14 MED ORDER — LIDOCAINE 2% (20 MG/ML) 5 ML SYRINGE
INTRAMUSCULAR | Status: DC | PRN
  Administered 2019-09-14: 60 mg via INTRAVENOUS
  Administered 2019-09-14: 20 mg via INTRAVENOUS

## 2019-09-14 MED ORDER — SODIUM CHLORIDE 0.9 % IV SOLN: INTRAVENOUS | Status: DC | PRN

## 2019-09-14 MED ORDER — DILTIAZEM HCL ER COATED BEADS 120 MG PO CP24
120.0000 mg | ORAL_CAPSULE | Freq: Every day | ORAL | Status: DC
  Administered 2019-09-15 – 2019-10-07 (×23): 120 mg via ORAL
  Filled 2019-09-14 (×23): qty 1

## 2019-09-14 MED ORDER — INSULIN ASPART 100 UNIT/ML ~~LOC~~ SOLN
0.0000 [IU] | Freq: Three times a day (TID) | SUBCUTANEOUS | Status: DC
  Administered 2019-09-15 (×2): 1 [IU] via SUBCUTANEOUS
  Administered 2019-09-15: 3 [IU] via SUBCUTANEOUS
  Administered 2019-09-16: 09:00:00 1 [IU] via SUBCUTANEOUS
  Administered 2019-09-16: 3 [IU] via SUBCUTANEOUS
  Administered 2019-09-16 – 2019-09-18 (×5): 2 [IU] via SUBCUTANEOUS
  Administered 2019-09-18: 09:00:00 1 [IU] via SUBCUTANEOUS
  Administered 2019-09-18: 2 [IU] via SUBCUTANEOUS
  Administered 2019-09-18: 1 [IU] via SUBCUTANEOUS
  Administered 2019-09-19: 2 [IU] via SUBCUTANEOUS
  Administered 2019-09-19: 17:00:00 1 [IU] via SUBCUTANEOUS
  Administered 2019-09-19: 08:00:00 2 [IU] via SUBCUTANEOUS
  Administered 2019-09-20: 1 [IU] via SUBCUTANEOUS
  Administered 2019-09-20: 09:00:00 2 [IU] via SUBCUTANEOUS
  Administered 2019-09-21: 18:00:00 1 [IU] via SUBCUTANEOUS
  Administered 2019-09-21: 2 [IU] via SUBCUTANEOUS
  Administered 2019-09-21: 1 [IU] via SUBCUTANEOUS
  Administered 2019-09-22 (×2): 2 [IU] via SUBCUTANEOUS
  Administered 2019-09-22: 3 [IU] via SUBCUTANEOUS
  Administered 2019-09-23 – 2019-09-25 (×7): 1 [IU] via SUBCUTANEOUS
  Administered 2019-09-26 (×2): 2 [IU] via SUBCUTANEOUS
  Administered 2019-09-26 – 2019-09-27 (×3): 1 [IU] via SUBCUTANEOUS
  Administered 2019-09-27 – 2019-09-29 (×3): 2 [IU] via SUBCUTANEOUS
  Administered 2019-09-30 – 2019-10-02 (×5): 1 [IU] via SUBCUTANEOUS
  Administered 2019-10-02: 14:00:00 2 [IU] via SUBCUTANEOUS
  Administered 2019-10-02 – 2019-10-03 (×4): 1 [IU] via SUBCUTANEOUS
  Administered 2019-10-04: 2 [IU] via SUBCUTANEOUS
  Administered 2019-10-05 – 2019-10-06 (×4): 1 [IU] via SUBCUTANEOUS
  Administered 2019-10-06 – 2019-10-07 (×4): 2 [IU] via SUBCUTANEOUS

## 2019-09-14 MED ORDER — DIAZEPAM 2 MG PO TABS
5.0000 mg | ORAL_TABLET | Freq: Two times a day (BID) | ORAL | Status: DC | PRN
  Administered 2019-09-18: 5 mg via ORAL
  Filled 2019-09-14 (×2): qty 3

## 2019-09-14 MED ORDER — FUROSEMIDE 20 MG PO TABS
20.0000 mg | ORAL_TABLET | Freq: Every day | ORAL | Status: DC
  Administered 2019-09-15 – 2019-09-30 (×16): 20 mg via ORAL
  Filled 2019-09-14 (×16): qty 1

## 2019-09-14 MED ORDER — HYDROCODONE-ACETAMINOPHEN 10-325 MG PO TABS
1.0000 | ORAL_TABLET | Freq: Four times a day (QID) | ORAL | Status: DC | PRN
  Administered 2019-09-14 – 2019-09-19 (×8): 1 via ORAL
  Filled 2019-09-14 (×11): qty 1

## 2019-09-14 MED ORDER — INSULIN ASPART 100 UNIT/ML ~~LOC~~ SOLN: 0.0000 [IU] | Freq: Every day | SUBCUTANEOUS | 11 refills | Status: DC

## 2019-09-14 MED ORDER — SORBITOL 70 % SOLN
30.0000 mL | Freq: Every day | Status: DC | PRN
  Administered 2019-09-17: 30 mL via ORAL
  Filled 2019-09-14: qty 30

## 2019-09-14 MED ORDER — PHENYLEPHRINE 40 MCG/ML (10ML) SYRINGE FOR IV PUSH (FOR BLOOD PRESSURE SUPPORT)
PREFILLED_SYRINGE | INTRAVENOUS | Status: DC | PRN
  Administered 2019-09-14 (×3): 80 ug via INTRAVENOUS
  Administered 2019-09-14: 40 ug via INTRAVENOUS
  Administered 2019-09-14: 80 ug via INTRAVENOUS

## 2019-09-14 MED ORDER — ACETAMINOPHEN 160 MG/5ML PO SOLN: 650.0000 mg | ORAL | Status: DC | PRN

## 2019-09-14 MED ORDER — ATORVASTATIN CALCIUM 10 MG PO TABS
20.0000 mg | ORAL_TABLET | Freq: Every day | ORAL | Status: DC
  Administered 2019-09-14 – 2019-10-06 (×23): 20 mg via ORAL
  Filled 2019-09-14 (×23): qty 2

## 2019-09-14 MED ORDER — INSULIN ASPART 100 UNIT/ML ~~LOC~~ SOLN: 0.0000 [IU] | Freq: Three times a day (TID) | SUBCUTANEOUS | 11 refills | Status: DC

## 2019-09-14 MED ORDER — LIDOCAINE 5 % EX PTCH
1.0000 | MEDICATED_PATCH | CUTANEOUS | Status: DC
  Administered 2019-09-18 – 2019-10-07 (×19): 1 via TRANSDERMAL
  Filled 2019-09-14 (×21): qty 1

## 2019-09-14 MED ORDER — LOSARTAN POTASSIUM 50 MG PO TABS
50.0000 mg | ORAL_TABLET | Freq: Every day | ORAL | Status: DC
  Administered 2019-09-15 – 2019-10-02 (×18): 50 mg via ORAL
  Filled 2019-09-14 (×18): qty 1

## 2019-09-14 MED ORDER — ACETAMINOPHEN 325 MG PO TABS
650.0000 mg | ORAL_TABLET | ORAL | Status: DC | PRN
  Administered 2019-09-15 – 2019-10-02 (×21): 650 mg via ORAL
  Filled 2019-09-14 (×24): qty 2

## 2019-09-14 MED ORDER — APIXABAN 5 MG PO TABS: 5.0000 mg | ORAL_TABLET | Freq: Two times a day (BID) | ORAL | Status: DC

## 2019-09-14 MED ORDER — ACETAMINOPHEN 650 MG RE SUPP: 650.0000 mg | RECTAL | Status: DC | PRN

## 2019-09-14 MED ORDER — APIXABAN 5 MG PO TABS
5.0000 mg | ORAL_TABLET | Freq: Two times a day (BID) | ORAL | Status: DC
  Administered 2019-09-14 – 2019-10-07 (×46): 5 mg via ORAL
  Filled 2019-09-14 (×46): qty 1

## 2019-09-14 MED ORDER — SENNOSIDES-DOCUSATE SODIUM 8.6-50 MG PO TABS: 1.0000 | ORAL_TABLET | Freq: Every evening | ORAL | Status: DC | PRN

## 2019-09-14 MED ORDER — PROPOFOL 500 MG/50ML IV EMUL
INTRAVENOUS | Status: DC | PRN
  Administered 2019-09-14: 100 ug/kg/min via INTRAVENOUS

## 2019-09-14 MED ORDER — ESCITALOPRAM OXALATE 10 MG PO TABS
10.0000 mg | ORAL_TABLET | Freq: Every day | ORAL | Status: DC
  Administered 2019-09-14 – 2019-10-07 (×24): 10 mg via ORAL
  Filled 2019-09-14 (×24): qty 1

## 2019-09-14 MED ORDER — ROPINIROLE HCL 1 MG PO TABS
2.0000 mg | ORAL_TABLET | Freq: Three times a day (TID) | ORAL | Status: DC
  Administered 2019-09-14 – 2019-09-20 (×17): 2 mg via ORAL
  Filled 2019-09-14 (×17): qty 2

## 2019-09-14 NOTE — Anesthesia Preprocedure Evaluation (Addendum)
Anesthesia Evaluation  Patient identified by MRN, date of birth, ID band Patient awake    Reviewed: Allergy & Precautions, NPO status , Patient's Chart, lab work & pertinent test results  Airway Mallampati: I       Dental no notable dental hx. (+) Teeth Intact   Pulmonary former smoker,    Pulmonary exam normal breath sounds clear to auscultation       Cardiovascular hypertension, Pt. on medications Normal cardiovascular exam Rhythm:Regular Rate:Normal     Neuro/Psych    GI/Hepatic   Endo/Other  diabetes, Oral Hypoglycemic Agents  Renal/GU   negative genitourinary   Musculoskeletal   Abdominal (+) + obese,   Peds  Hematology   Anesthesia Other Findings -  70%   -------------------------------------------------------------------  Indications:   R06.09 Dyspnea.   -------------------------------------------------------------------  History:  PMH: Acquired from the patient and from the patient&'s  chart. PMH: Coronary artery disease. Chronic kidney disease.  Risk factors: Hypertension. Diabetes mellitus.  Hypercholesterolemia.   -------------------------------------------------------------------  Study Conclusions   - Left ventricle: The cavity size was normal. Systolic function was  vigorous. The estimated ejection fraction was in the range of 65%  to 70%. Wall motion was normal; there were no regional wall  motion abnormalities. Doppler parameters are consistent with  abnormal left ventricular relaxation (grade 1 diastolic  dysfunction). There was no evidence of elevated ventricular  filling pressure by Doppler parameters.  - Aortic valve: There was no regurgitation.  - Mitral valve: There was trivial regurgitation.  - Right ventricle: The cavity size was normal. Wall thickness was  normal. Systolic function was normal.  - Right atrium: The atrium was normal in size.  - Tricuspid  valve: There was trivial regurgitation.  - Pulmonary arteries: Systolic pressure was within the normal  range.  - Inferior vena cava: The vessel was normal in size.  - Pericardium, extracardiac: There was no pericardial effusion.   Reproductive/Obstetrics                           Anesthesia Physical Anesthesia Plan  ASA: II  Anesthesia Plan: MAC   Post-op Pain Management:    Induction:   PONV Risk Score and Plan:   Airway Management Planned: Mask and Natural Airway  Additional Equipment: None and TEE  Intra-op Plan:   Post-operative Plan:   Informed Consent: I have reviewed the patients History and Physical, chart, labs and discussed the procedure including the risks, benefits and alternatives for the proposed anesthesia with the patient or authorized representative who has indicated his/her understanding and acceptance.     Dental advisory given  Plan Discussed with: CRNA  Anesthesia Plan Comments:         Anesthesia Quick Evaluation

## 2019-09-14 NOTE — Progress Notes (Signed)
OT Cancellation Note  Patient Details Name: [name] [name] MRN: [id number] DOB: 1951-01-30   Cancelled Treatment:    Reason Eval/Treat Not Completed: Patient at procedure or test/ unavailable  Pt off unit for stat CT due to worsening weakness. Will continue to follow and return as time allows and pt is appropriate.   Dorinda Hill OTR/L Acute Rehabilitation Services Office: [phone]  Wyn Forster 09/14/2019, 12:04 PM

## 2019-09-14 NOTE — TOC Transition Note (Signed)
Transition of Care [facility]) - CM/SW Discharge Note   Patient Details  Name: [name] [name] MRN: [id number] Date of Birth: [birth year]  Transition of Care [facility]) CM/SW Contact:  Pollie Friar, RN Phone Number: 09/14/2019, 3:53 PM   Clinical Narrative:    Pt discharging to CIR today. CM signing off.    Final next level of care: IP Rehab Facility Barriers to Discharge: No Barriers Identified   Patient Goals and CMS Choice        Discharge Placement                       Discharge Plan and Services                                     Social Determinants of Health (SDOH) Interventions     Readmission Risk Interventions No flowsheet data found.

## 2019-09-14 NOTE — Progress Notes (Signed)
Occupational Therapy Treatment Patient Details Name: ADRIAUNNA SUNDET MRN: [id number] DOB: 1951/03/10 Today's Date: 09/14/2019    History of present illness MYKENZI STREBE is a 69 y.o. female with medical history significant of CAD, CKD stage II, COPD/chronic hypoxemic respiratory failure on 3 L home oxygen at night, type 2 diabetes, hypertension, hyperlipidemia, thoracic aortic aneurysm, and recently admitted 1/7-1/9 for acute CVA with MRI showing watershed infarcts in bilateral hemispheres.  Dopplers done 1/19 revealed right lower extremity DVT and CT angiogram chest done 1/12 revealed acute bilateral segmental and subsegmental pulmonary emboli.  Seen in ED 1/27 for new weakness, but negative work up so d/c.  Admitted 1/31 with L side weakness; MRI brain showing continued interval evolution of previously seen subacute infarcts with multiple new scattered acute ischemic watershed type infarcts involving the bilateral cerebral hemispheres, right greater than left.  Associated petechial hemorrhage about a few of these infarcts without frank hemorrhagic transformation.  Small volume subarachnoid hemorrhage layering along the cortical sulcus within the right frontal lobe, new from previous. Stat CT done 2/321 with no new or progressive finding   OT comments  Pt received in bed, agreeable to OT/PT session. Pt appears to have increased weakness this session. She required max cues for sequencing bed mobility progression and maxA to complete progression. She required modA+2 for sit<>stand and maxA+2 for stand pivot transfer to [facility], requiring L knee block to prevent buckling. Pt attempted to perform pericare in standing, but due to instability and weakness required maxA for thorough pericare following toileting on BSC. She is expected to d/c to CIR this date. Should pt remain in acute care, OT will continue to follow to progress appropriately.     Follow Up Recommendations  Supervision/Assistance - 24 hour;CIR     Equipment Recommendations  Other (comment)(TBD)    Recommendations for Other Services      Precautions / Restrictions Precautions Precautions: Fall Precaution Comments: recent L humeral fx Restrictions Weight Bearing Restrictions: No       Mobility Bed Mobility Overal bed mobility: Needs Assistance Bed Mobility: Supine to Sit;Sit to Supine     Supine to sit: Max assist;HOB elevated Sit to supine: Max assist;+2 for physical assistance;+2 for safety/equipment   General bed mobility comments: maxA to progress BLE toward EOB, sequencing, and progression of trunk to upright posture  Transfers Overall transfer level: Needs assistance Equipment used: 2 person hand held assist Transfers: Sit to/from [facility] Sit to Stand: Mod assist;+2 physical assistance;+2 safety/equipment Stand pivot transfers: Max assist;+2 physical assistance;+2 safety/equipment       General transfer comment: modA to progress into standing;maxA+2 for stand pivot, cues and assistance with weight shifting and facilitating LLE taking 2 steps     Balance Overall balance assessment: Needs assistance Sitting-balance support: Feet supported Sitting balance-Leahy Scale: Fair Sitting balance - Comments: fair static sitting balance, did not accept much challenge in sitting balance this session   Standing balance support: Bilateral upper extremity supported;During functional activity Standing balance-Leahy Scale: Poor Standing balance comment: reliant on BUE support and therapist blocking LLE to prevent knee buckling                           ADL either performed or assessed with clinical judgement   ADL Overall ADL's : Needs assistance/impaired     Grooming: Set up;Sitting Grooming Details (indicate cue type and reason): brushed hair while sitting upright  Toilet Transfer: Stand-pivot;Maximal Patent examiner Details (indicate cue type  and reason): transfer to [facility]  with maxA +2 for stability, sequencing, Toileting- Clothing Manipulation and Hygiene: Maximal assistance Toileting - Clothing Manipulation Details (indicate cue type and reason): maxA for thorough pericare     Functional mobility during ADLs: Maximal assistance;+2 for physical assistance;+2 for safety/equipment General ADL Comments: limited by cognition, impaired functional use of LUE and instability/weakness in LLE.LUE     Vision   Vision Assessment?: No apparent visual deficits   Perception     Praxis      Cognition Arousal/Alertness: Awake/alert Behavior During Therapy: WFL for tasks assessed/performed Overall Cognitive Status: No family/caregiver present to determine baseline cognitive functioning                                 General Comments: demonstrates some L side neglect, requires frequent cues for safe hand placement and sequencing progression of bed mobility        Exercises     Shoulder Instructions       General Comments VSS throughout session    Pertinent Vitals/ Pain       Pain Assessment: Faces Faces Pain Scale: Hurts little more Pain Location: L shoulder Pain Descriptors / Indicators: Grimacing;Guarding Pain Intervention(s): Monitored during session;Limited activity within patient's tolerance  Home Living   Living Arrangements: (son and his family live next door) Available Help at Discharge: Family;Available 73 hours/day(110 year old grand daughter doing remote school and can be wi)                                Lives With: Alone    Prior Functioning/Environment              Frequency  Min 2X/week        Progress Toward Goals  OT Goals(current goals can now be found in the care plan section)  Progress towards OT goals: Progressing toward goals  Acute Rehab OT Goals Patient Stated Goal: to go to rehab today OT Goal Formulation: With patient Time For Goal Achievement:  09/27/19 Potential to Achieve Goals: Good ADL Goals Pt Will Perform Grooming: with modified independence;standing Pt Will Perform Upper Body Bathing: with supervision Pt Will Perform Lower Body Bathing: with min guard assist Pt Will Perform Upper Body Dressing: with supervision Pt Will Perform Lower Body Dressing: with min guard assist Pt Will Transfer to Toilet: with min guard assist Pt Will Perform Toileting - Clothing Manipulation and hygiene: with min guard assist Pt Will Perform Tub/Shower Transfer: with min guard assist Pt/caregiver will Perform Home Exercise Program: Increased strength;Increased ROM;Left upper extremity;With written HEP provided  Plan Discharge plan remains appropriate    Co-evaluation    PT/OT/SLP Co-Evaluation/Treatment: Yes Reason for Co-Treatment: Complexity of the patient's impairments (multi-system involvement);For patient/therapist safety;To address functional/ADL transfers   OT goals addressed during session: ADL's and self-care      AM-PAC OT "6 Clicks" Daily Activity     Outcome Measure   Help from another person eating meals?: A Little Help from another person taking care of personal grooming?: A Little Help from another person toileting, which includes using toliet, bedpan, or urinal?: A Lot Help from another person bathing (including washing, rinsing, drying)?: A Lot Help from another person to put on and taking off regular upper body clothing?: A Little Help from another person to  put on and taking off regular lower body clothing?: A Lot 6 Click Score: 15    End of Session Equipment Utilized During Treatment: Gait belt  OT Visit Diagnosis: Other abnormalities of gait and mobility (R26.89);Pain;History of falling (Z91.81);Muscle weakness (generalized) (M62.81) Pain - Right/Left: Left Pain - part of body: Shoulder   Activity Tolerance Patient tolerated treatment well   Patient Left in bed;with call bell/phone within reach;with bed alarm  set;with family/visitor present   Nurse Communication Mobility status        Time: [id number] OT Time Calculation (min): 23 min  Charges: OT General Charges $OT Visit: 1 Visit OT Treatments $Self Care/Home Management : 8-22 mins  Dorinda Hill OTR/L Acute Rehabilitation Services Office: [place] 09/14/2019, 3:59 PM

## 2019-09-14 NOTE — Progress Notes (Signed)
  Echocardiogram Echocardiogram Transesophageal has been performed.  [name] [name] 09/14/2019, 2:00 PM

## 2019-09-14 NOTE — CV Procedure (Signed)
    TRANSESOPHAGEAL ECHOCARDIOGRAM   NAME:  [name] [name]   MRN: [id number] DOB:  1951-05-06   ADMIT DATE: 09/11/2019  INDICATIONS: CVA  PROCEDURE:   Informed consent was obtained prior to the procedure. The risks, benefits and alternatives for the procedure were discussed and the patient comprehended these risks.  Risks include, but are not limited to, cough, sore throat, vomiting, nausea, somnolence, esophageal and stomach trauma or perforation, bleeding, low blood pressure, aspiration, pneumonia, infection, trauma to the teeth and death.    Procedural time out performed. The oropharynx was anesthetized with topical 1% benzocaine.  Anesthesia was administered by  Dr Jillyn Hidden and Reather Laurence, CRNA.  The transesophageal probe was inserted in the esophagus and stomach without difficulty and multiple views were obtained.    COMPLICATIONS:    There were no immediate complications.  FINDINGS:  LEFT VENTRICLE: EF = 60-65% No regional wall motion abnormalities.  RIGHT VENTRICLE: Normal size and function.   LEFT ATRIUM: No thrombus/mass.  LEFT ATRIAL APPENDAGE: No thrombus/mass.   RIGHT ATRIUM: No thrombus/mass.  AORTIC VALVE:  Trileaflet. Mild regurgitation. No vegetation.  MITRAL VALVE:    Normal structure. Trivial regurgitation. No vegetation.  TRICUSPID VALVE: Normal structure. Trivial regurgitation. No vegetation.  PULMONIC VALVE: Grossly normal structure. Trivial regurgitation. No apparent vegetation.  INTERATRIAL SEPTUM: Lipomatous hypertrophy of interatrial septum.  Positive bubble study  PERICARDIUM: No effusion noted.  DESCENDING AORTA: Moderate diffuse plaque seen   CONCLUSION: Positive bubble study consistent wtih PFO   Oswaldo Milian MD [facility]  [address], [place], [place] [address] [phone]   7:07 PM

## 2019-09-14 NOTE — Progress Notes (Addendum)
Physical Therapy Treatment Patient Details Name: [name] [name] MRN: [id number] DOB: Oct 22, 1950 Today's Date: 09/14/2019    History of Present Illness [name] [name] is a 69 y.o. female with medical history significant of CAD, CKD stage II, COPD/chronic hypoxemic respiratory failure on 3 L home oxygen at night, type 2 diabetes, hypertension, hyperlipidemia, thoracic aortic aneurysm, and recently admitted 1/7-1/9 for acute CVA with MRI showing watershed infarcts in bilateral hemispheres.  Dopplers done 1/19 revealed right lower extremity DVT and CT angiogram chest done 1/12 revealed acute bilateral segmental and subsegmental pulmonary emboli.  Seen in ED 1/27 for new weakness, but negative work up so d/c.  Admitted 1/31 with L side weakness; MRI brain showing continued interval evolution of previously seen subacute infarcts with multiple new scattered acute ischemic watershed type infarcts involving the bilateral cerebral hemispheres, right greater than left.  Associated petechial hemorrhage about a few of these infarcts without frank hemorrhagic transformation.  Small volume subarachnoid hemorrhage layering along the cortical sulcus within the right frontal lobe, new from previous. Stat CT done 2/321 with no new or progressive finding    PT Comments    Pt performed gt training and functional mobility during session but limited to stand pivot with +2 assistance and noted with increased LLE/LUE.  Continue to recommend aggressive CIR therapies.    Follow Up Recommendations  CIR     Equipment Recommendations  None recommended by PT    Recommendations for Other Services Rehab consult     Precautions / Restrictions Precautions Precautions: Fall Precaution Comments: recent L humeral fx Restrictions Weight Bearing Restrictions: No    Mobility  Bed Mobility Overal bed mobility: Needs Assistance Bed Mobility: Supine to Sit;Sit to Supine     Supine to sit: Max assist;HOB elevated Sit to  supine: Max assist;+2 for physical assistance;+2 for safety/equipment   General bed mobility comments: maxA to progress BLE toward EOB, sequencing, and progression of trunk to upright posture  Transfers Overall transfer level: Needs assistance Equipment used: 2 person hand held assist;Rolling walker (2 wheeled) Transfers: Sit to/from [facility] Sit to Stand: Mod assist;+2 physical assistance Stand pivot transfers: Max assist;+2 physical assistance       General transfer comment: modA to progress into standing;maxA+2 for stand pivot, cues and assistance with weight shifting and facilitating LLE taking 2 steps   Ambulation/Gait Ambulation/Gait assistance: Max assist;+2 physical assistance Gait Distance (Feet): 2 Feet(steps) Assistive device: Rolling walker (2 wheeled) Gait Pattern/deviations: Step-to pattern;Shuffle     General Gait Details: Pt required manual facilitattion to advance 2 steps to turn and pivot back to bed.   Stairs             Wheelchair Mobility    Modified Rankin (Stroke Patients Only) Modified Rankin (Stroke Patients Only) Pre-Morbid Rankin Score: Moderate disability Modified Rankin: Moderately severe disability     Balance Overall balance assessment: Needs assistance Sitting-balance support: Feet supported Sitting balance-Leahy Scale: Fair Sitting balance - Comments: fair static sitting balance, did not accept much challenge in sitting balance this session   Standing balance support: Bilateral upper extremity supported;During functional activity Standing balance-Leahy Scale: Poor Standing balance comment: reliant on BUE support and therapist blocking LLE to prevent knee buckling                            Cognition Arousal/Alertness: Awake/alert Behavior During Therapy: WFL for tasks assessed/performed Overall Cognitive Status: No family/caregiver present to determine baseline cognitive  functioning                                  General Comments: demonstrates some L side neglect, requires frequent cues for safe hand placement and sequencing progression of bed mobility      Exercises      General Comments General comments (skin integrity, edema, etc.): VSS throughout session      Pertinent Vitals/Pain Pain Assessment: Faces Faces Pain Scale: Hurts little more Pain Location: L shoulder Pain Descriptors / Indicators: Grimacing;Guarding Pain Intervention(s): Monitored during session    Home Living   Living Arrangements: (son and his family live next door) Available Help at Discharge: Family;Available 17 hours/day(105 year old grand daughter doing remote school and can be wi)                Prior Function            PT Goals (current goals can now be found in the care plan section) Acute Rehab PT Goals Patient Stated Goal: to go to rehab today Potential to Achieve Goals: Good Progress towards PT goals: Progressing toward goals    Frequency    Min 4X/week      PT Plan Current plan remains appropriate    Co-evaluation PT/OT/SLP Co-Evaluation/Treatment: Yes Reason for Co-Treatment: Complexity of the patient's impairments (multi-system involvement) PT goals addressed during session: Mobility/safety with mobility OT goals addressed during session: ADL's and self-care      AM-PAC PT "6 Clicks" Mobility   Outcome Measure  Help needed turning from your back to your side while in a flat bed without using bedrails?: A Little Help needed moving from lying on your back to sitting on the side of a flat bed without using bedrails?: A Little Help needed moving to and from a bed to a chair (including a wheelchair)?: A Little Help needed standing up from a chair using your arms (e.g., wheelchair or bedside chair)?: A Little Help needed to walk in hospital room?: A Little Help needed climbing 3-5 steps with a railing? : A Lot 6 Click Score: 17    End of Session  Equipment Utilized During Treatment: Gait belt Activity Tolerance: Patient tolerated treatment well Patient left: in chair;with call bell/phone within reach;with chair alarm set Nurse Communication: Mobility status PT Visit Diagnosis: Other abnormalities of gait and mobility (R26.89);Hemiplegia and hemiparesis Hemiplegia - Right/Left: Left Hemiplegia - dominant/non-dominant: Non-dominant Hemiplegia - caused by: Cerebral infarction     Time: 1516-1540 PT Time Calculation (min) (ACUTE ONLY): 24 min  Charges:  $Therapeutic Activity: 8-22 mins                     Erasmo Leventhal , PTA Acute Rehabilitation Services Pager [phone] Office [phone]     Inioluwa Boulay Eli Hose 09/14/2019, 4:12 PM

## 2019-09-14 NOTE — Discharge Instructions (Signed)

## 2019-09-14 NOTE — Anesthesia Procedure Notes (Signed)
Procedure Name: MAC Date/Time: 09/14/2019 1:06 PM Performed by: Janene Harvey, CRNA Pre-anesthesia Checklist: Patient identified, Emergency Drugs available, Suction available and Patient being monitored Oxygen Delivery Method: Nasal cannula Dental Injury: Teeth and Oropharynx as per pre-operative assessment

## 2019-09-14 NOTE — Interval H&P Note (Signed)
History and Physical Interval Note:  09/14/2019 12:36 PM  [name] [name]  has presented today for surgery, with the diagnosis of STROKE.  The various methods of treatment have been discussed with the patient and family. After consideration of risks, benefits and other options for treatment, the patient has consented to  Procedure(s): TRANSESOPHAGEAL ECHOCARDIOGRAM (TEE) (N/A) as a surgical intervention.  The patient's history has been reviewed, patient examined, no change in status, stable for surgery.  I have reviewed the patient's chart and labs.  Questions were answered to the patient's satisfaction.     Donato Heinz

## 2019-09-14 NOTE — Discharge Summary (Signed)
Physician Discharge Summary  [name] [name] [id number] DOB: 21-Apr-1951 DOA: 09/11/2019  PCP: [name] Frizzle, MD  Admit date: 09/11/2019 Discharge date: 09/14/2019  Admitted From: home Discharge disposition: CIR   Code Status: Full Code  Diet Recommendation: cardiac diet   Recommendations for Outpatient Follow-Up:   1. Follow-up with PCP 2. Follow-up with cardiology 3. Follow-up with neurology  Discharge Diagnosis:   Principal Problem:   Acute CVA (cerebrovascular accident) [facility]) Active Problems:   Type 2 diabetes mellitus ([place])   Pulmonary embolism (HCC)   Subarachnoid hemorrhage (HCC)   DVT (deep venous thrombosis) (HCC)   CKD (chronic kidney disease), stage II   Uncontrolled type 2 diabetes mellitus with hyperglycemia (HCC)   PFO (patent foramen ovale)   History of Present Illness / Brief narrative:  [name] Sarubbi Wrightis a 69 y.o.femalewith PMH significant for HTN, HLD, T2DM, CAD, COPD on 3 L oxygen at home, a recent shoulder fracture, nonsurgically managed with sling and pain medications.  1/7-1/9 - Patient was recently hospitalized with acute bilateral stroke, MRI brain showed bilateral strokes in the watershed distribution.  She was discharged home on aspirin and Plavix.  1/12, she had a in a post hospital evaluation by PCP and was noticed to have shortness of breath.  CT angio of chest showed acute bilateral segmental subsegmental pulmonary bleeding.  Subsequent DVT scan showed right lower extremity DVT.  She was started on Xarelto replacing aspirin and Plavix. 1/19, bubble study done by outpatient neurology showed a small PFO but it was not felt large enough to allow for emboli to pass from right to left side of the heart. 1/27, patient presented to the ED with left-sided weakness.  CT head as well as MRI brain showed evolution of old strokes with no new infarcts and hence patient was subsequently discharged to home. 1/31, patient presents to the ED again with  worsening left-sided weakness.  According to the patient, 69 around 3 PM, she developed sudden onset left leg weakness which is worse than her priorweaknessas well as worsening left upper extremity weakness. EMS brought her to the ED.    Blood pressure in the ED would vary significantly between the 2 arms: the right arm was around 90-120systolicand left arm was [id number] systolic. Repeat MRI brain this admission showed new infarcts in the same watershed distribution as well as petechial hemorrhagic conversion and small subarachnoid hemorrhage.  Patient was admitted under hospitalist medicine service for further evaluation and management.  Neurologist was consulted for further recommendations.  Hospital Course:  Acute right parietal occipital lobe infarcts Subacute bilateral watershed territory infarcts Small volume subarachnoid hemorrhage in the right frontal lobe petechial hemorrhages area of prior infarctions -Recently hospitalized 1/7-1/9 for acute bilateral strokes in dorsal distribution.  She had complete stroke work-up done at that time.  -Carotid ultrasounds showed 1 to 39% stenosis bilaterally.  -Echocardiogram showed normal EF and no other acute findings.  -A1c 9.4,LDL 69 at goal.  -She was discharged on aspirin and Plavix x3 months, then Plavix alone.  -As an outpatient, she had a bubble study showed small PFO but it was thought not to be large enough to allow emboli.   -However, patient has again presented with more of acute embolic stroke.   -Neurology following.  Per recommendation, Xarelto was switched to Eliquis. -Also sent for hypercoagulable work-up. -PT/OT eval appreciated.  CIR recommended.   -Continue Eliquis and statin -On examination today, patient had denser weakness in the left upper extremity and lower extremity.  However, repeat CT scan did not show any new finding.  PFO -Noted in transcranial Doppler study done as an outpatient on 1/19. -At the time it  was not deemed big enough to be closed.  However patient seems to have active DVT, pulmonary lesion as well as embolic stroke at this time which raises the strong significance of embolism through PFO. - cardiology consultation obtained.  Patient underwent TEE today.  Cardiology note awaited.  Rehab doctor to follow-up recommendations from cardiology.  PM&R Dr. Posey Pronto aware.  Recent pulmonary embolism/lower leg DVT  -Switched from Xarelto to Eliquis.  Essential hypertension Blood pressure fluctuation -Patient states that her right arm blood pressure is always low and left arm blood pressure should be used as the measurement for medication adjustment. -Patient continues to have significant blood pressure fluctuation between the 2 arms: On arrival, blood pressure on the right arm was around 90-120systolicand left arm was [id number] systolic. -Significant blood pressure variation might have also caused her to have watershed stroke. -Continue to monitor blood pressure. -Home meds include Cardizem, Lasix (with potassium replacement), lisinopril.  All resumed.  Endorsed to be adjusted based on blood pressure response.  CKD stage II -Stable. Renal function at baseline.  COPD/chronic hypoxemic respiratory failure on 3 L home oxygen at night -Stable. Continue home oxygen. DuoNeb as needed.  Type 2 diabetes -A1c 9.4 on 1/8 -Only on Metformin at home.  -Blood sugar remain elevated episodes more than 200. -Currently on Lantus 5 units daily from this morning.  Continue sliding scale with Accu-Cheks. -Diabetes coordinator consulted.  Apparently patient's A1c was good until recently.  Last few months she had to be on prednisone because of which blood sugar may have risen. -We expect her A1c to improve again.  At discharge, plan is to resume Metformin.  Stable to discharge to CIR today.  Subjective:  Seen and examined this morning.  Pleasant elderly Caucasian female.  I noted this morning that her  weakness on the left side has gotten denser.  Repeat CT scan of head was obtained stat which did not show any changes.   Underwent TEE this morning.  Discharge Exam:   Vitals:   09/14/19 1045 09/14/19 1345 09/14/19 1355 09/14/19 1405  BP: (!) 191/55 (!) 117/42 (!) 121/39 (!) 172/49  Pulse: 74  76 74  Resp: 16 19 18 15   Temp: 97.6 F (36.4 C) 98.2 F (36.8 C)    TempSrc: Axillary Axillary    SpO2: 97% 99% 97% 98%  Weight:      Height:        General exam: Appears calm and comfortable.  Skin: No rashes, lesions or ulcers. HEENT: Atraumatic, normocephalic, supple neck, no obvious bleeding Lungs: Clear to auscultation bilaterally, no crackles or wheezing. CVS: Regular rate and rhythm, no murmur GI/Abd soft, nondistended, nontender, bowel sounds CNS: Alert, awake and oriented x3. continues to have weakness in the left side. 1-2/5 left upper extremity proximal muscle strength, 1/5 left lower extremity strength.  Right side with good strength Psychiatry: Mood appropriate Extremities: No pedal edema no calf tenderness  Discharge Instructions:  Wound care: None Discharge Instructions    Diet - low sodium heart healthy   Complete by: As directed    Increase activity slowly   Complete by: As directed      Follow-up Information    [name] Frizzle, MD Follow up.   Specialty: Family Medicine Contact information: [place] [address] [phone]  Garvin Fila, MD Follow up.   Specialties: Neurology, Radiology Contact information: [place] [address] [phone]          Allergies as of 09/14/2019      Reactions   Guaifenesin & Derivatives Anaphylaxis   Severe Reaction   Tape Other (See Comments)   TAPE PEELS OFF THE SKIN!!!!!!!   Benazepril Cough   Morphine Nausea Only   Zyrtec [cetirizine] Swelling, Other (See Comments)   Hands and feet became swollen   Latex Rash, Other (See Comments)   No powdered  gloves!!      Medication List    STOP taking these medications   clopidogrel 75 MG tablet Commonly known as: PLAVIX   rivaroxaban 20 MG Tabs tablet Commonly known as: Xarelto     TAKE these medications   albuterol 108 (90 Base) MCG/ACT inhaler Commonly known as: VENTOLIN HFA Inhale 2 puffs into the lungs every 4 (four) hours as needed for wheezing or shortness of breath.   apixaban 5 MG Tabs tablet Commonly known as: ELIQUIS Take 1 tablet (5 mg total) by mouth 2 (two) times daily.   atorvastatin 20 MG tablet Commonly known as: LIPITOR Take 1 tablet (20 mg total) by mouth daily. What changed: when to take this   bimatoprost 0.01 % Soln Commonly known as: LUMIGAN Place 1 drop into both eyes at bedtime.   brinzolamide 1 % ophthalmic suspension Commonly known as: AZOPT Place 1 drop into both eyes every 12 (twelve) hours.   Dexilant 60 MG capsule Generic drug: dexlansoprazole Take 1 capsule (60 mg total) by mouth daily.   diazepam 5 MG tablet Commonly known as: VALIUM Take 1 tablet (5 mg total) by mouth every 12 (twelve) hours as needed for anxiety (insomnia). What changed: reasons to take this   diltiazem 120 MG tablet Commonly known as: CARDIZEM Take 120 mg by mouth daily.   ergocalciferol 1.25 MG (50000 UT) capsule Commonly known as: VITAMIN D2 Take 1 capsule (50,000 Units total) by mouth once a week. What changed: when to take this   escitalopram 10 MG tablet Commonly known as: Lexapro Take 1 tablet (10 mg total) by mouth daily.   FreeStyle Libre 14 Day Sensor Misc CHANGE EVERY 14 DAYS What changed: See the new instructions.   furosemide 40 MG tablet Commonly known as: LASIX Take 1 tablet (40 mg total) by mouth daily.   HYDROcodone-acetaminophen 10-325 MG tablet Commonly known as: NORCO Take 1 tablet by mouth every 6 (six) hours as needed for moderate pain.   insulin aspart 100 UNIT/ML injection Commonly known as: novoLOG Inject 0-5 Units into the  skin at bedtime.   insulin aspart 100 UNIT/ML injection Commonly known as: novoLOG Inject 0-9 Units into the skin 3 (three) times daily with meals.   ipratropium-albuterol 0.5-2.5 (3) MG/3ML Soln Commonly known as: DUONEB INHALE 1 VIAL VIA NEBULIZER EVERY 6 HOURS AS NEEDED What changed:   how much to take  how to take this  when to take this  reasons to take this  additional instructions   losartan 100 MG tablet Commonly known as: COZAAR Take 1 tablet (100 mg total) by mouth daily.   metFORMIN 1000 MG tablet Commonly known as: GLUCOPHAGE Take 1 tablet (1,000 mg total) by mouth 2 (two) times daily with a meal.   OXYGEN Inhale 2 L into the lungs at bedtime.   potassium chloride SA 20 MEQ tablet Commonly known as: KLOR-CON Take 1 tablet (20 mEq  total) by mouth daily.   rOPINIRole 2 MG tablet Commonly known as: REQUIP Take 1 tablet (2 mg total) by mouth 3 (three) times daily. What changed: when to take this   Tiotropium Bromide-Olodaterol 2.5-2.5 MCG/ACT Aers Inhale 2 puffs into the lungs daily. What changed: how much to take       Time coordinating discharge: 35 minutes  The results of significant diagnostics from this hospitalization (including imaging, microbiology, ancillary and laboratory) are listed below for reference.    Procedures and Diagnostic Studies:   MR BRAIN WO CONTRAST  Result Date: 09/11/2019 CLINICAL DATA:  Initial evaluation for acute difficulty walking, stroke suspected. EXAM: MRI HEAD WITHOUT CONTRAST TECHNIQUE: Multiplanar, multiecho pulse sequences of the brain and surrounding structures were obtained without intravenous contrast. COMPARISON:  Comparison made with multiple recent brain MRIs, most recent of which from 09/07/2019. FINDINGS: Brain: Generalized age-related cerebral atrophy with chronic microvascular ischemic disease again noted. Multiple remote lacunar infarcts present within the hemispheric cerebral white matter. Continued  interval evolution of previously seen subacute watershed type infarcts involving the bilateral cerebral hemispheres. However, multiple new watershed type infarcts are now seen on today's exam, increased in size and number but with similar distribution as compared to previous. Overall, involvement is slightly worse within the right cerebral hemisphere as compared to the left. New patchy involvement of the bilateral parieto-occipital regions as well, also greater on the right (series 5, image 66). Now seen are a few scattered foci of susceptibility artifact associated with these infarcts, consistent with associated petechial hemorrhage (series 14, image 46). Additionally, there is a serpiginous focus of FLAIR and susceptibility artifact seen layering along a cortical sulcus within the right frontal lobe, concerning for a small volume subarachnoid hemorrhage, new from previous (series 14, image 46). No frank intraparenchymal hematoma. No significant mass effect. No mass lesion or midline shift. No hydrocephalus. No extra-axial fluid collection. Pituitary gland suprasellar region normal. Midline structures intact. Vascular: Major intracranial vascular flow voids are maintained. Skull and upper cervical spine: Craniocervical junction normal. Upper cervical spine within normal limits. Bone marrow signal intensity normal. No scalp soft tissue abnormality. Sinuses/Orbits: Globes and orbital soft tissues within normal limits. Paranasal sinuses remain largely clear. No significant mastoid effusion. Other: None. IMPRESSION: 1. Continued interval evolution of previously seen subacute infarcts, with multiple new scattered acute ischemic watershed type infarcts involving the bilateral cerebral hemispheres, right greater than left. Associated petechial hemorrhage about a few of these infarcts without frank hemorrhagic transformation. 2. Small volume subarachnoid hemorrhage layering along the cortical sulcus within the right  frontal lobe, new from previous. 3. Otherwise stable exam with underlying cerebral atrophy, chronic microvascular ischemic disease, and multiple remote lacunar infarcts involving the hemispheric cerebral white matter. Critical Value/emergent results were called by telephone at the time of interpretation on 09/11/2019 at 8:43 pm to provider [facility] , who verbally acknowledged these results. Electronically Signed   By: Jeannine Boga M.D.   On: 09/11/2019 20:43     Labs:   Basic Metabolic Panel: Recent Labs  Lab 09/07/19 1601 09/07/19 1601 09/07/19 1623 09/07/19 1623 09/07/19 1641 09/07/19 1641 09/11/19 1556 09/12/19 0853 09/14/19 0236  NA 136  --  137  --  138  --  141  --  139  K 2.9*   < > 3.3*   < > 3.2*   < > 3.8  --  3.9  CL 99  --  99  --  96*  --  106  --  102  CO2 24  --  25  --   --   --  25  --  26  GLUCOSE 151*  --  142*  --  142*  --  124*  --  165*  BUN 18  --  17  --  19  --  12  --  11  CREATININE 0.96  --  0.95  --  0.90  --  0.79  --  0.92  CALCIUM 8.9  --  9.1  --   --   --  9.4  --  8.7*  MG  --   --   --   --   --   --  1.6* 1.6* 2.0   < > = values in this interval not displayed.   GFR Estimated Creatinine Clearance: 62.2 mL/min (by C-G formula based on SCr of 0.92 mg/dL). Liver Function Tests: Recent Labs  Lab 09/07/19 1623 09/11/19 1556  AST 28 28  ALT 30 30  ALKPHOS 49 49  BILITOT 0.2* 0.2*  PROT 6.9 6.7  ALBUMIN 3.5 3.3*   No results for input(s): LIPASE, AMYLASE in the last 168 hours. No results for input(s): AMMONIA in the last 168 hours. Coagulation profile Recent Labs  Lab 09/07/19 1623  INR 1.2    CBC: Recent Labs  Lab 09/07/19 1601 09/07/19 1623 09/07/19 1641 09/11/19 1556 09/14/19 0236  WBC 11.7* 12.0*  --  9.9 9.2  NEUTROABS  --  7.7  --  5.2 4.6  HGB 12.7 12.8 13.3 12.3 11.9*  HCT 40.5 39.8 39.0 38.2 36.9  MCV 90.0 89.0  --  88.6 88.3  PLT 383 340  --  315 300   Cardiac Enzymes: No results for input(s):  CKTOTAL, CKMB, CKMBINDEX, TROPONINI in the last 168 hours. BNP: Invalid input(s): POCBNP CBG: Recent Labs  Lab 09/13/19 0633 09/13/19 1146 09/13/19 1635 09/13/19 2220 09/14/19 0606  GLUCAP 156* 199* 284* 172* 149*   D-Dimer No results for input(s): DDIMER in the last 72 hours. Hgb A1c No results for input(s): HGBA1C in the last 72 hours. Lipid Profile No results for input(s): CHOL, HDL, LDLCALC, TRIG, CHOLHDL, LDLDIRECT in the last 72 hours. Thyroid function studies No results for input(s): TSH, T4TOTAL, T3FREE, THYROIDAB in the last 72 hours.  Invalid input(s): FREET3 Anemia work up No results for input(s): VITAMINB12, FOLATE, FERRITIN, TIBC, IRON, RETICCTPCT in the last 72 hours. Microbiology Recent Results (from the past 240 hour(s))  Urine culture     Status: Abnormal   Collection Time: 09/11/19  3:15 PM   Specimen: Urine, Random  Result Value Ref Range Status   Specimen Description URINE, RANDOM  Final   Special Requests NONE  Final   Culture (A)  Final    <10,000 COLONIES/mL INSIGNIFICANT GROWTH Performed at [facility], [address], [place], [place] [address]    Report Status 09/12/2019 FINAL  Final  SARS CORONAVIRUS 2 (TAT 6-24 HRS) Nasopharyngeal Nasopharyngeal Swab     Status: None   Collection Time: 09/11/19  9:06 PM   Specimen: Nasopharyngeal Swab  Result Value Ref Range Status   SARS Coronavirus 2 NEGATIVE NEGATIVE Final    Comment: (NOTE) SARS-CoV-2 target nucleic acids are NOT DETECTED. The SARS-CoV-2 RNA is generally detectable in upper and lower respiratory specimens during the acute phase of infection. Negative results do not preclude SARS-CoV-2 infection, do not rule out co-infections with other pathogens, and should not be used as the sole basis for treatment or  other patient management decisions. Negative results must be combined with clinical observations, patient history, and epidemiological information. The expected result is  Negative. Fact Sheet for Patients: SugarRoll.be Fact Sheet for Healthcare Providers: https://www.woods-mathews.com/ This test is not yet approved or cleared by the [place] FDA and  has been authorized for detection and/or diagnosis of SARS-CoV-2 by FDA under an Emergency Use Authorization (EUA). This EUA will remain  in effect (meaning this test can be used) for the duration of the COVID-19 declaration under Section 56 4(b)(1) of the Act, 21 U.S.C. section 360bbb-3(b)(1), unless the authorization is terminated or revoked sooner. Performed at [facility], [place] [address], [place], [place] [address]     Please note: You were cared for by a hospitalist during your hospital stay. Once you are discharged, your primary care physician will handle any further medical issues. Please note that NO REFILLS for any discharge medications will be authorized once you are discharged, as it is imperative that you return to your primary care physician (or establish a relationship with a primary care physician if you do not have one) for your post hospital discharge needs so that they can reassess your need for medications and monitor your lab values.  Signed: Terrilee Croak  [facility] 09/14/2019, 3:12 PM

## 2019-09-14 NOTE — PMR Pre-admission (Addendum)
PMR Admission Coordinator Pre-Admission Assessment  Patient: [name] [name] is an 69 y.o., female MRN: [id number] DOB: 16-May-1951 Height: 5\' 4"  (162.6 cm) Weight: 91 kg              Insurance Information HMO:     PPO:      PCP:      IPA:      80/20:      OTHER:  PRIMARY: Medicare a and b      Policy#: [id number]      Subscriber: pt Benefits:  Phone #: passport one online     Name: 09/14/2019 Eff. Date: 08/11/2005     Deduct: $1484      Out of Pocket Max: none      Life Max: none CIR: 100%      SNF: 20 full days Outpatient: 80%     Co-Pay: 20% Home Health: 100%      Co-Pay: none DME: 80%     Co-Pay: 20% Providers: pt choice  SECONDARY: [facility]      Policy#: [id number]      Subscriber: pt  Medicaid Application Date:       Case Manager:  Disability Application Date:       Case Worker:   The "Data Collection Information Summary" for patients in Inpatient Rehabilitation Facilities with attached "Privacy Act [place] Records" was provided and verbally reviewed with: Patient  Emergency Contact Information Contact Information     Name Relation Home Work Mobile   [name] [name] [phone]        Current Medical History  Patient Admitting Diagnosis: CVA  History of Present Illness: a 69 year old right-handed female with history of CAD with history of angioplasty, CKD stage II, COPD with chronic hypoxemic respiratory failure maintained on 3 L of oxygen at home at night, type 2 diabetes mellitus, recent shoulder fracture nonsurgical managed with a sling, hypertension, hyperlipidemia, thoracic aortic aneurysm, patient quit smoking 12 months ago.  History taken from chart review and patient. Patient lives alone.  1 level home with a ramped entrance.  Used a walker occasionally prior to admission.  Her [name] did help some basic ADLs.  Patient had been admitted 08/18/2019 to 08/20/2019 for acute CVA with MRI showing watershed infarction in bilateral hemispheres.  CT angiogram head and neck  revealed 50% stenosis of the proximal right internal carotid artery due to bulky atherosclerotic calcification.  Carotid Dopplers with no ICA stenosis.  TTE with normal EF.  Patient was discharged home on aspirin and Plavix x3 months then Plavix alone.  On 08/23/2019 hospital evaluation by PCP noted to have shortness of breath where CT angiogram of the chest showed acute bilateral segmental subsegmental pulmonary emboli.  Subsequent DVT scan showed right lower extremity DVT involving the right peroneal veins.  Patient was started on Xarelto replacing aspirin and Plavix.  On 08/30/2019 bubble study done by outpatient neurology showed a small PFO but it was not felt large enough to allow for emboli to pass from right to left side of heart.  Patient presented to the ED on 09/07/2019 due to left hemi-paresis. CT of the head as well as MRI of the brain showed evolution of old stroke without new infarcts. As a result, patient was subsequently discharged to home.  She presented on 09/11/2019 with increasing left-sided weakness as well as hypotension.  She did receive 500 mL fluid bolus.  MRI and imaging revealed continued interval evolution of previously seen subacute infarcts with multiple new scattered acute ischemic watershed  type infarctions involving the bilateral cerebral hemispheres, right greater than left.  Associated petechial hemorrhage about a few of these infarcts without frank hemorrhagic transformation.  Small volume subarachnoid hemorrhage layering along the cortical sulcus within the right frontal lobe, new from previous tracing.  Neurology consulted hypercoagulable work-up.  Patient Xarelto has been changed to Eliquis given new infarctions and PE.  A follow-up stat cranial CT scan was completed this morning for increased left side weakness. Personally reviewed, no acute intracranial abnormalities.  However, given current deficits, suspect futher extension. Discussed with Neurology weakness, PFO, and plans for  possible intervention.  Cardiology service follow-up in regards to recent findings of PFO. TEE completed 2/3/2021results pending and if PFO larger than anticipated or high risk anatomy with an atrial septal aneurysm consider plan percutaneous transcatheter PFO closure, likely as outpatient or after CIR recovery. Please see preadmission assessment from earlier today as well.   Complete NIHSS TOTAL: 8 Glasgow Coma Scale Score: 15  Past Medical History  Past Medical History:  Diagnosis Date   Aneurysm, thoracic aortic (HCC)    Anxiety    Arthritis    CAD (coronary artery disease)    Chronic kidney disease    Renal insufficiency   COPD (chronic obstructive pulmonary disease) (HCC)    Depression    Diabetes mellitus    Diverticulosis of colon (without mention of hemorrhage)    Duodenitis without mention of hemorrhage    Embolic stroke (HCC)    GERD (gastroesophageal reflux disease)    Hemorrhoids    Hypercholesteremia    Hypertension    Insomnia    Pulmonary embolism (HCC)    Tinnitus    Vertigo     Family History  family history includes Breast cancer in her sister; Colon polyps in her father and sister; Diabetes in her brother and mother; Heart disease in her father; Hemophilia in her mother.  Prior Rehab/Hospitalizations:  Has the patient had prior rehab or hospitalizations prior to admission? Yes  Has the patient had major surgery during 100 days prior to admission? Yes  Current Medications   Current Facility-Administered Medications:    [MAR Hold]  stroke: mapping our early stages of recovery book, , Does not apply, Once, Shela Leff, MD   [facility] Hold] acetaminophen (TYLENOL) tablet 650 mg, 650 mg, Oral, Q4H PRN, 650 mg at 09/14/19 0813 **OR** [MAR Hold] acetaminophen (TYLENOL) 160 MG/5ML solution 650 mg, 650 mg, Per Tube, Q4H PRN **OR** [MAR Hold] acetaminophen (TYLENOL) suppository 650 mg, 650 mg, Rectal, Q4H PRN, Shela Leff, MD   [MAR Hold] apixaban  (ELIQUIS) tablet 5 mg, 5 mg, Oral, BID, Shela Leff, MD, 5 mg at 09/14/19 [id number]   [facility] Hold] atorvastatin (LIPITOR) tablet 20 mg, 20 mg, Oral, QHS, Rathore, Wandra Feinstein, MD, 20 mg at 09/13/19 2216   [facility] Hold] diazepam (VALIUM) tablet 5 mg, 5 mg, Oral, Q12H PRN, Shela Leff, MD, 5 mg at 09/12/19 0200   [MAR Hold] diltiazem (CARDIZEM CD) 24 hr capsule 120 mg, 120 mg, Oral, Daily, Dahal, Binaya, MD, 120 mg at 09/14/19 0812   [facility] Hold] furosemide (LASIX) tablet 20 mg, 20 mg, Oral, Daily, Dahal, Binaya, MD, 20 mg at 09/14/19 0813   [MAR Hold] HYDROcodone-acetaminophen (NORCO) 10-325 MG per tablet 1 tablet, 1 tablet, Oral, Q6H PRN, Dahal, Binaya, MD, 1 tablet at 09/14/19 0417   [MAR Hold] insulin aspart (novoLOG) injection 0-5 Units, 0-5 Units, Subcutaneous, QHS, Shela Leff, MD, 2 Units at 09/12/19 2135   [facility] Hold] insulin aspart (novoLOG) injection  0-9 Units, 0-9 Units, Subcutaneous, TID WC, Shela Leff, MD, 1 Units at 09/14/19 0814   [facility] Hold] ipratropium-albuterol (DUONEB) 0.5-2.5 (3) MG/3ML nebulizer solution 3 mL, 3 mL, Nebulization, Q6H PRN, Shela Leff, MD   [facility] Hold] lidocaine (LIDODERM) 5 % 1 patch, 1 patch, Transdermal, Q24H, Dahal, Binaya, MD, 1 patch at 09/14/19 1118   [MAR Hold] losartan (COZAAR) tablet 50 mg, 50 mg, Oral, Daily, Dahal, Binaya, MD, 50 mg at 09/14/19 0813   [MAR Hold] rOPINIRole (REQUIP) tablet 2 mg, 2 mg, Oral, TID, Dahal, Binaya, MD, 2 mg at 09/14/19 0834   [MAR Hold] senna-docusate (Senokot-S) tablet 1 tablet, 1 tablet, Oral, QHS PRN, Shela Leff, MD, 1 tablet at 09/12/19 2133  Patients Current Diet:  Diet Order     None       Precautions / Restrictions Precautions Precautions: Fall Precaution Comments: L Humeral fx Restrictions Weight Bearing Restrictions: No   Has the patient had 2 or more falls or a fall with injury in the past year?No  Prior Activity Level Community (5-7x/wk): Indpendent until few days  pta  Prior Functional Level Prior Function Level of Independence: Needs assistance Gait / Transfers Assistance Needed: using walker occasionally, but got worse over day and half pta ADL's / Homemaking Assistance Needed: [name] helped with shower Comments: drives and does cooking and cleaning, still doing it since shoulder injury, just lost husband in July due to suicide  Self Care: Did the patient need help bathing, dressing, using the toilet or eating?  Independent  Indoor Mobility: Did the patient need assistance with walking from room to room (with or without device)? Independent  Stairs: Did the patient need assistance with internal or external stairs (with or without device)? Independent  Functional Cognition: Did the patient need help planning regular tasks such as shopping or remembering to take medications? Independent  Home Assistive Devices / Equipment Home Assistive Devices/Equipment: Wheelchair Home Equipment: Environmental consultant - 2 wheels, Wheelchair - manual, Shower seat, Grab bars - toilet, Grab bars - tub/shower  Prior Device Use: Indicate devices/aids used by the patient prior to current illness, exacerbation or injury? Walker  Current Functional Level Cognition  Arousal/Alertness: Awake/alert Overall Cognitive Status: Within Functional Limits for tasks assessed Orientation Level: Oriented to person Problem Solving: Appears intact Safety/Judgment: Appears intact    Extremity Assessment (includes Sensation/Coordination)  Upper Extremity Assessment: LUE deficits/detail LUE Deficits / Details: AROM shoulder NT due to pain from preior injury, she used R UE to assist with lifting and placing L hand onto RW LUE Sensation: WNL LUE Coordination: decreased fine motor, decreased gross motor  Lower Extremity Assessment: Defer to PT evaluation LLE Deficits / Details: limited ankle DF due to weakness (strength 2+/5); knee extension 4-/5, reports numbness both feet from PN, LLE Sensation:  decreased light touch    ADLs  Overall ADL's : Needs assistance/impaired Eating/Feeding: Set up Eating/Feeding Details (indicate cue type and reason): to open containers Grooming: Wash/dry hands, Wash/dry face, Oral care, Standing, Minimal assistance Upper Body Bathing: Sitting, Moderate assistance Upper Body Bathing Details (indicate cue type and reason): d/t L shoulder pain and weakness Lower Body Bathing: Sit to/from stand, Moderate assistance Upper Body Dressing : Sitting, Moderate assistance Lower Body Dressing: Sit to/from stand, Maximal assistance Toilet Transfer: Ambulation, RW, Minimal assistance General ADL Comments: min - mod A sit <> stand and min A stand pivot transfer with RW    Mobility  Overal bed mobility: Needs Assistance Bed Mobility: Supine to Sit Supine to sit: HOB elevated,  Min assist Sit to supine: Min assist General bed mobility comments: cues to use rail, assist for trunk, increased time scooting to EOB    Transfers  Overall transfer level: Needs assistance Equipment used: Rolling walker (2 wheeled) Transfers: Sit to/from Stand, [employer] Transfers Sit to Stand: Mod assist, Min assist Stand pivot transfers: Min assist General transfer comment: cues for safe hand placement and technique to initaite power up and rise to RW. HHA/support provided at Lt UE to raise it to the RW.    Ambulation / Gait / Stairs / Wheelchair Mobility  Ambulation/Gait Ambulation/Gait assistance: Herbalist (Feet): 20 Feet(20 ft total (1x13', 1x7')) Assistive device: Rolling walker (2 wheeled) Gait Pattern/deviations: Step-to pattern, Decreased stride length, Decreased dorsiflexion - left, Scissoring, Step-through pattern General Gait Details: visual cues provided for Lt foot step length/foot placement; pt continues with scissoring step pattern and required min assist ~ 50% of time for Lt foot positioning. pt ambulated 2 bouts in hospital room with seated rest break  in recliner between bouts due to fatigue and Lt shoulder pain. Gait velocity: decreased    Posture / Balance Balance Overall balance assessment: Needs assistance Sitting-balance support: Feet supported Sitting balance-Leahy Scale: Fair Standing balance support: Bilateral upper extremity supported, During functional activity Standing balance-Leahy Scale: Poor Standing balance comment: relaint on UE support for balance    Special needs/care consideration BiPAP/CPAP CPM Continuous Drip IV Dialysis         Life Vest Oxygen Special Bed Trach Size Wound Vac Skin                               Bowel mgmt:continent Bladder mgmt:external catheter Diabetic mgmt Hgb A1c 9.4 Behavioral consideration  Chemo/radiation  Designated visitor is John Followed by [facility]   Previous Home Environment  Living Arrangements: ([name] and his family live next door)  Lives With: Alone Available Help at Discharge: Family, Available 100 hours/day(1 year old grand daughter doing remote school and can be wi) Type of Home: House Home Layout: One level Home Access: Ramped entrance Bathroom Shower/Tub: Multimedia programmer: Handicapped height Bathroom Accessibility: Yes Home Care Services: No Additional Comments: 29 y/o granddaughter helps during the day and [name] and daughter in law help at night and weekends per pt report.  Discharge Living Setting Plans for Discharge Living Setting: Patient's home, Alone Type of Home at Discharge: House Discharge Home Layout: One level Discharge Home Access: [place] entrance Discharge Bathroom Shower/Tub: Walk-in shower Discharge Bathroom Toilet: Handicapped height Discharge Bathroom Accessibility: Yes How Accessible: Accessible via walker Does the patient have any problems obtaining your medications?: No  Social/Family/Support Systems Patient Roles: Parent Contact Information: [name], [name] Anticipated Caregiver: [name], daughter in law and 53 year old grand  daughter Anticipated Caregiver's Contact Information: see above Ability/Limitations of Caregiver: [name] works days; 71 year old asisst during the day Caregiver Availability: 24/7 Discharge Plan Discussed with Primary Caregiver: Yes Is Caregiver In Agreement with Plan?: Yes Does Caregiver/Family have Issues with Lodging/Transportation while Pt is in Rehab?: No  Goals/Additional Needs Patient/Family Goal for Rehab: Min A with PT, OT, and SLP Expected length of stay: ELOS 17-21 days. Pt/Family Agrees to Admission and willing to participate: Yes Program Orientation Provided & Reviewed with Pt/Caregiver Including Roles  & Responsibilities: Yes  Decrease burden of Care through IP rehab admission:  Possible need for SNF placement upon discharge:  Patient Condition: This patient's condition remains as documented in  the consult dated 09/13/2019, in which the Rehabilitation Physician determined and documented that the patient's condition is appropriate for intensive rehabilitative care in an inpatient rehabilitation facility. Will admit to inpatient rehab today.  Preadmission Screen Completed By:  Cleatrice Burke, RN, 09/14/2019 2:05 PM ______________________________________________________________________   Discussed status with Dr. Posey Pronto on 09/14/2019 at  1458 and received approval for admission today.  Admission Coordinator:  Cleatrice Burke, time [id number] Date 09/14/2019

## 2019-09-14 NOTE — Plan of Care (Signed)
Plan of care reviewed with pt. Pt tolerated PO morning meds well. Left shoulder painful. CT scan ordered by MD. Pt left floor at 1000 for CT and TEE. Pt is still off floor.  Problem: Education: Goal: Knowledge of disease or condition will improve Outcome: Progressing Goal: Knowledge of secondary prevention will improve Outcome: Progressing Goal: Knowledge of patient specific risk factors addressed and post discharge goals established will improve Outcome: Progressing Goal: Individualized Educational Video(s) Outcome: Progressing   Problem: Coping: Goal: Will verbalize positive feelings about self Outcome: Progressing Goal: Will identify appropriate support needs Outcome: Progressing   Problem: Health Behavior/Discharge Planning: Goal: Ability to manage health-related needs will improve Outcome: Progressing   Problem: Self-Care: Goal: Ability to participate in self-care as condition permits will improve Outcome: Progressing Goal: Verbalization of feelings and concerns over difficulty with self-care will improve Outcome: Progressing   Problem: Intracerebral Hemorrhage Tissue Perfusion: Goal: Complications of Intracerebral Hemorrhage will be minimized Outcome: Progressing   Problem: Ischemic Stroke/TIA Tissue Perfusion: Goal: Complications of ischemic stroke/TIA will be minimized Outcome: Progressing   Problem: Education: Goal: Knowledge of General Education information will improve Description: Including pain rating scale, medication(s)/side effects and non-pharmacologic comfort measures Outcome: Progressing   Problem: Health Behavior/Discharge Planning: Goal: Ability to manage health-related needs will improve Outcome: Progressing   Problem: Clinical Measurements: Goal: Ability to maintain clinical measurements within normal limits will improve Outcome: Progressing Goal: Will remain free from infection Outcome: Progressing Goal: Diagnostic test results will  improve Outcome: Progressing Goal: Respiratory complications will improve Outcome: Progressing Goal: Cardiovascular complication will be avoided Outcome: Progressing   Problem: Activity: Goal: Risk for activity intolerance will decrease Outcome: Progressing   Problem: Nutrition: Goal: Adequate nutrition will be maintained Outcome: Progressing   Problem: Coping: Goal: Level of anxiety will decrease Outcome: Progressing   Problem: Elimination: Goal: Will not experience complications related to bowel motility Outcome: Progressing Goal: Will not experience complications related to urinary retention Outcome: Progressing   Problem: Pain Managment: Goal: General experience of comfort will improve Outcome: Progressing   Problem: Safety: Goal: Ability to remain free from injury will improve Outcome: Progressing   Problem: Skin Integrity: Goal: Risk for impaired skin integrity will decrease Outcome: Progressing

## 2019-09-14 NOTE — Significant Event (Signed)
On my exam this am, I noted that the patient has worsening weakness on the left side.  0/5 in left arm and 1/5 in left leg. Also seems to have hemineglect. Moves right extremities when asked to move left. Per RN, TEE is scheduled for 12 noon. d/w Dr. Leonie Man. We'll repeat a plain CT head stat.

## 2019-09-14 NOTE — Progress Notes (Signed)
PT Cancellation Note  Patient Details Name: [name] [name] MRN: [id number] DOB: 02-13-1951   Cancelled Treatment:    Reason Eval/Treat Not Completed: (P) Other (comment)(Pt off unit for stat CT due to worsening weakness.  Will f/u per POC.)   Judithann Villamar Eli Hose 09/14/2019, 10:22 AM  Erasmo Leventhal , PTA Acute Rehabilitation Services Pager [phone] Office [phone]

## 2019-09-14 NOTE — H&P (Signed)
Physical Medicine and Rehabilitation Admission H&P    Chief Complaint  Patient presents with  . Weakness  : HPI: Sondra D. Venezia is a 69 year old right-handed female with history of CAD with history of angioplasty, CKD stage II, COPD with chronic hypoxemic respiratory failure maintained on 3 L of oxygen at home at night, type 2 diabetes mellitus, recent shoulder fracture nonsurgical managed with a sling, hypertension, hyperlipidemia, thoracic aortic aneurysm, patient quit smoking 12 months ago.  History taken from chart review and patient. Patient lives alone.  1 level home with a ramped entrance.  Used a walker occasionally prior to admission.  Her son did help some basic ADLs.  Patient had been admitted 08/18/2019 to 08/20/2019 for acute CVA with MRI showing watershed infarction in bilateral hemispheres.  CT angiogram head and neck revealed 50% stenosis of the proximal right internal carotid artery due to bulky atherosclerotic calcification.  Carotid Dopplers with no ICA stenosis.  TTE with normal EF.  Patient was discharged home on aspirin and Plavix x3 months then Plavix alone.  On 08/23/2019 hospital evaluation by PCP noted to have shortness of breath where CT angiogram of the chest showed acute bilateral segmental subsegmental pulmonary emboli.  Subsequent DVT scan showed right lower extremity DVT involving the right peroneal veins.  Patient was started on Xarelto replacing aspirin and Plavix.  On 08/30/2019 bubble study done by outpatient neurology showed a small PFO but it was not felt large enough to allow for emboli to pass from right to left side of heart.  Patient presented to the ED on 09/07/2019 due to left hemi-paresis. CT of the head as well as MRI of the brain showed evolution of old stroke without new infarcts. As a result, patient was subsequently discharged to home.  She presented on 09/11/2019 with increasing left-sided weakness as well as hypotension.  She did receive 500 mL fluid bolus.   MRI and imaging revealed continued interval evolution of previously seen subacute infarcts with multiple new scattered acute ischemic watershed type infarctions involving the bilateral cerebral hemispheres, right greater than left.  Associated petechial hemorrhage about a few of these infarcts without frank hemorrhagic transformation.  Small volume subarachnoid hemorrhage layering along the cortical sulcus within the right frontal lobe, new from previous tracing.  Neurology consulted hypercoagulable work-up.  Patient Xarelto has been changed to Eliquis given new infarctions and PE.  A follow-up stat cranial CT scan was completed this morning for increased left side weakness. Personally reviewed, no acute intracranial abnormalities.  However, given current deficits, suspect futher extension. Discussed with Neurology weakness, PFO, and plans for possible intervention.  Cardiology service follow-up in regards to recent findings of PFO. TEE completed, positive for PFO. Please see preadmission assessment from earlier today as well.   Review of Systems  Constitutional: Negative for chills and fever.  HENT: Positive for tinnitus.   Eyes: Negative for blurred vision and double vision.  Respiratory: Positive for shortness of breath.   Cardiovascular: Negative for chest pain and palpitations.  Gastrointestinal: Positive for constipation. Negative for heartburn, nausea and vomiting.       GERD  Genitourinary: Negative for dysuria, flank pain and hematuria.  Musculoskeletal: Positive for joint pain and myalgias.  Skin: Negative for rash.  Neurological: Positive for dizziness, sensory change, focal weakness and weakness.  Psychiatric/Behavioral: Positive for depression. The patient has insomnia.        Anxiety   Past Medical History:  Diagnosis Date  . Aneurysm, thoracic aortic ([place])   .  Anxiety   . Arthritis   . CAD (coronary artery disease)   . Chronic kidney disease    Renal insufficiency  . COPD  (chronic obstructive pulmonary disease) ([place])   . Depression   . Diabetes mellitus   . Diverticulosis of colon (without mention of hemorrhage)   . Duodenitis without mention of hemorrhage   . Embolic stroke ([place])   . GERD (gastroesophageal reflux disease)   . Hemorrhoids   . Hypercholesteremia   . Hypertension   . Insomnia   . Pulmonary embolism ([place])   . Tinnitus   . Vertigo    Past Surgical History:  Procedure Laterality Date  . ANGIOPLASTY    . CHOLECYSTECTOMY    . ROTATOR CUFF REPAIR Right 04/2012  . TUBAL LIGATION     Family History  Problem Relation Age of Onset  . Diabetes Mother   . Hemophilia Mother   . Breast cancer Sister   . Heart disease Father   . Colon polyps Father   . Diabetes Brother   . Colon polyps Sister    Social History:  reports that she quit smoking about 13 months ago. Her smoking use included cigarettes. She has never used smokeless tobacco. She reports that she does not drink alcohol or use drugs. Allergies:  Allergies  Allergen Reactions  . Guaifenesin & Derivatives Anaphylaxis    Severe Reaction  . Tape Other (See Comments)    TAPE PEELS OFF THE SKIN!!!!!!!  . Benazepril Cough  . Morphine Nausea Only  . Zyrtec [Cetirizine] Swelling and Other (See Comments)    Hands and feet became swollen  . Latex Rash and Other (See Comments)    No powdered gloves!!   Medications Prior to Admission  Medication Sig Dispense Refill  . rivaroxaban (XARELTO) 20 MG TABS tablet Take 1 tablet (20 mg total) by mouth daily with supper. 30 tablet 2  . albuterol (VENTOLIN HFA) 108 (90 Base) MCG/ACT inhaler Inhale 2 puffs into the lungs every 4 (four) hours as needed for wheezing or shortness of breath. 54 g 3  . atorvastatin (LIPITOR) 20 MG tablet Take 1 tablet (20 mg total) by mouth daily. (Patient taking differently: Take 20 mg by mouth at bedtime. ) 90 tablet 3  . bimatoprost (LUMIGAN) 0.01 % SOLN Place 1 drop into both eyes at bedtime.    . brinzolamide  (AZOPT) 1 % ophthalmic suspension Place 1 drop into both eyes every 12 (twelve) hours.     . clopidogrel (PLAVIX) 75 MG tablet Take 1 tablet (75 mg total) by mouth daily. (Patient not taking: Reported on 09/07/2019) 30 tablet 1  . Continuous Blood Gluc Sensor (FREESTYLE LIBRE 14 DAY SENSOR) MISC CHANGE EVERY 14 DAYS (Patient taking differently: Inject 1 patch into the skin every 14 (fourteen) days. ) 2 each 3  . dexlansoprazole (DEXILANT) 60 MG capsule Take 1 capsule (60 mg total) by mouth daily. 90 capsule 3  . diazepam (VALIUM) 5 MG tablet Take 1 tablet (5 mg total) by mouth every 12 (twelve) hours as needed for anxiety (insomnia). (Patient taking differently: Take 5 mg by mouth every 12 (twelve) hours as needed (anxiety or insomnia). ) 60 tablet 1  . diltiazem (CARDIZEM) 120 MG tablet Take 120 mg by mouth daily.    . ergocalciferol (VITAMIN D2) 1.25 MG (50000 UT) capsule Take 1 capsule (50,000 Units total) by mouth once a week. (Patient taking differently: Take 50,000 Units by mouth every Tuesday. ) 24 capsule 0  . escitalopram (LEXAPRO)  10 MG tablet Take 1 tablet (10 mg total) by mouth daily. 90 tablet 3  . furosemide (LASIX) 40 MG tablet Take 1 tablet (40 mg total) by mouth daily. 30 tablet 3  . HYDROcodone-acetaminophen (NORCO) 10-325 MG per tablet Take 1 tablet by mouth every 6 (six) hours as needed for moderate pain.     Marland Kitchen ipratropium-albuterol (DUONEB) 0.5-2.5 (3) MG/3ML SOLN INHALE 1 VIAL VIA NEBULIZER EVERY 6 HOURS AS NEEDED (Patient taking differently: Take 3 mLs by nebulization every 6 (six) hours as needed (as directed). ) 360 mL 0  . losartan (COZAAR) 100 MG tablet Take 1 tablet (100 mg total) by mouth daily. 90 tablet 3  . metFORMIN (GLUCOPHAGE) 1000 MG tablet Take 1 tablet (1,000 mg total) by mouth 2 (two) times daily with a meal. 60 tablet 1  . OXYGEN Inhale 2 L into the lungs at bedtime.     . potassium chloride SA (KLOR-CON) 20 MEQ tablet Take 1 tablet (20 mEq total) by mouth daily.  5 tablet 0  . rOPINIRole (REQUIP) 2 MG tablet Take 1 tablet (2 mg total) by mouth 3 (three) times daily. (Patient taking differently: Take 2 mg by mouth at bedtime. ) 270 tablet 3  . Tiotropium Bromide-Olodaterol 2.5-2.5 MCG/ACT AERS Inhale 2 puffs into the lungs daily. (Patient taking differently: Inhale 1 puff into the lungs daily. ) 12 g 3    Drug Regimen Review Drug regimen was reviewed and remains appropriate with no significant issues identified  Home: Home Living Family/patient expects to be discharged to:: Private residence Living Arrangements: Alone Available Help at Discharge: Family Type of Home: House Home Access: Ramped entrance Home Layout: One level Bathroom Shower/Tub: Multimedia programmer: Handicapped height Bathroom Accessibility: Yes Home Equipment: Environmental consultant - 2 wheels, Wheelchair - manual, Shower seat, Grab bars - toilet, Grab bars - tub/shower Additional Comments: 42 y/o granddaughter helps during the day and son and daughter in law help at night and weekends per pt report.   Functional History: Prior Function Level of Independence: Needs assistance Gait / Transfers Assistance Needed: using walker occasionally, but got worse over day and half pta ADL's / Homemaking Assistance Needed: son helped with shower Comments: drives and does cooking and cleaning, still doing it since shoulder injury, just lost husband in July due to suicide  Functional Status:  Mobility: Bed Mobility Overal bed mobility: Needs Assistance Bed Mobility: Supine to Sit Supine to sit: HOB elevated, Min assist Sit to supine: Min assist General bed mobility comments: cues to use rail, assist for trunk, increased time scooting to EOB Transfers Overall transfer level: Needs assistance Equipment used: Rolling walker (2 wheeled) Transfers: Sit to/from Stand, [employer] Transfers Sit to Stand: Mod assist, Min assist Stand pivot transfers: Min assist General transfer comment: cues  for safe hand placement and technique to initaite power up and rise to RW. HHA/support provided at Lt UE to raise it to the RW. Ambulation/Gait Ambulation/Gait assistance: Min assist Gait Distance (Feet): 20 Feet(20 ft total (1x13', 1x7')) Assistive device: Rolling walker (2 wheeled) Gait Pattern/deviations: Step-to pattern, Decreased stride length, Decreased dorsiflexion - left, Scissoring, Step-through pattern General Gait Details: visual cues provided for Lt foot step length/foot placement; pt continues with scissoring step pattern and required min assist ~ 50% of time for Lt foot positioning. pt ambulated 2 bouts in hospital room with seated rest break in recliner between bouts due to fatigue and Lt shoulder pain. Gait velocity: decreased    ADL: ADL Overall ADL's :  Needs assistance/impaired Eating/Feeding: Set up Eating/Feeding Details (indicate cue type and reason): to open containers Grooming: Wash/dry hands, Wash/dry face, Oral care, Standing, Minimal assistance Upper Body Bathing: Sitting, Moderate assistance Upper Body Bathing Details (indicate cue type and reason): d/t L shoulder pain and weakness Lower Body Bathing: Sit to/from stand, Moderate assistance Upper Body Dressing : Sitting, Moderate assistance Lower Body Dressing: Sit to/from stand, Maximal assistance Toilet Transfer: Ambulation, RW, Minimal assistance General ADL Comments: min - mod A sit <> stand and min A stand pivot transfer with RW  Cognition: Cognition Overall Cognitive Status: Within Functional Limits for tasks assessed Arousal/Alertness: Awake/alert Orientation Level: Oriented X4 Problem Solving: Appears intact Safety/Judgment: Appears intact Cognition Arousal/Alertness: Awake/alert Behavior During Therapy: WFL for tasks assessed/performed Overall Cognitive Status: Within Functional Limits for tasks assessed  Physical Exam: Blood pressure (!) 181/72, pulse 72, temperature 97.9 F (36.6 C),  temperature source Oral, resp. rate 19, height 5\' 4"  (1.626 m), weight 91 kg, SpO2 95 %. Physical Exam  Vitals reviewed. Constitutional: She appears well-developed and well-nourished.  HENT:  Head: Normocephalic and atraumatic.  Eyes: EOM are normal. Right eye exhibits no discharge. Left eye exhibits no discharge.  Neck: No tracheal deviation present. No thyromegaly present.  Respiratory: Effort normal. No stridor. No respiratory distress.  GI: Soft. She exhibits no distension.  Musculoskeletal:     Comments: No edema or tenderness in extremities  Neurological: She is alert.  Patient is alert sitting up in bed.   Follows full commands.   Makes good eye contact with examiner.   She provides her name age and date of birth.   Motor: RUE/RLE: 5/5 proximal distal LUE: Shoulder abduction, elbow flexion/extension 0/5, handgrip 4/5 with myotonia-like LLE: Hip flexion 1/5, distally 0/5 Sensation diminished to touch bilateral plantar feet Left neglect  Skin: Skin is warm and dry.  Psychiatric: Her affect is blunt. She is slowed.    Results for orders placed or performed during the hospital encounter of 09/11/19 (from the past 48 hour(s))  CBG monitoring, ED     Status: Abnormal   Collection Time: 09/12/19  7:35 AM  Result Value Ref Range   Glucose-Capillary 161 (H) 70 - 99 mg/dL  Magnesium     Status: Abnormal   Collection Time: 09/12/19  8:53 AM  Result Value Ref Range   Magnesium 1.6 (L) 1.7 - 2.4 mg/dL    Comment: Performed at [facility] [address], [place], [place] [address]  Glucose, capillary     Status: Abnormal   Collection Time: 09/12/19 12:01 PM  Result Value Ref Range   Glucose-Capillary 146 (H) 70 - 99 mg/dL  Glucose, capillary     Status: Abnormal   Collection Time: 09/12/19  4:47 PM  Result Value Ref Range   Glucose-Capillary 151 (H) 70 - 99 mg/dL  Glucose, capillary     Status: Abnormal   Collection Time: 09/12/19  9:23 PM  Result Value Ref Range    Glucose-Capillary 226 (H) 70 - 99 mg/dL  Glucose, capillary     Status: Abnormal   Collection Time: 09/13/19  6:33 AM  Result Value Ref Range   Glucose-Capillary 156 (H) 70 - 99 mg/dL  Glucose, capillary     Status: Abnormal   Collection Time: 09/13/19 11:46 AM  Result Value Ref Range   Glucose-Capillary 199 (H) 70 - 99 mg/dL   Comment 1 Notify RN    Comment 2 Document in Chart   Glucose, capillary  Status: Abnormal   Collection Time: 09/13/19  4:35 PM  Result Value Ref Range   Glucose-Capillary 284 (H) 70 - 99 mg/dL  Glucose, capillary     Status: Abnormal   Collection Time: 09/13/19 10:20 PM  Result Value Ref Range   Glucose-Capillary 172 (H) 70 - 99 mg/dL  Basic metabolic panel     Status: Abnormal   Collection Time: 09/14/19  2:36 AM  Result Value Ref Range   Sodium 139 135 - 145 mmol/L   Potassium 3.9 3.5 - 5.1 mmol/L    Comment: SLIGHT HEMOLYSIS   Chloride 102 98 - 111 mmol/L   CO2 26 22 - 32 mmol/L   Glucose, Bld 165 (H) 70 - 99 mg/dL   BUN 11 8 - 23 mg/dL   Creatinine, Ser 0.92 0.44 - 1.00 mg/dL   Calcium 8.7 (L) 8.9 - 10.3 mg/dL   GFR calc non Af Amer >60 >60 mL/min   GFR calc Af Amer >60 >60 mL/min   Anion gap 11 5 - 15    Comment: Performed at [facility], [place] [address], [place], [place] [address]  CBC with Differential/Platelet     Status: Abnormal   Collection Time: 09/14/19  2:36 AM  Result Value Ref Range   WBC 9.2 4.0 - 10.5 K/uL   RBC 4.18 3.87 - 5.11 MIL/uL   Hemoglobin 11.9 (L) 12.0 - 15.0 g/dL   HCT 36.9 36.0 - 46.0 %   MCV 88.3 80.0 - 100.0 fL   MCH 28.5 26.0 - 34.0 pg   MCHC 32.2 30.0 - 36.0 g/dL   RDW 15.4 11.5 - 15.5 %   Platelets 300 150 - 400 K/uL   nRBC 0.0 0.0 - 0.2 %   Neutrophils Relative % 49 %   Neutro Abs 4.6 1.7 - 7.7 K/uL   Lymphocytes Relative 38 %   Lymphs Abs 3.4 0.7 - 4.0 K/uL   Monocytes Relative 9 %   Monocytes Absolute 0.8 0.1 - 1.0 K/uL   Eosinophils Relative 3 %   Eosinophils Absolute 0.2 0.0 - 0.5 K/uL    Basophils Relative 0 %   Basophils Absolute 0.0 0.0 - 0.1 K/uL   Immature Granulocytes 1 %   Abs Immature Granulocytes 0.06 0.00 - 0.07 K/uL    Comment: Performed at [facility] [address] [place], [place] [address]  Magnesium     Status: None   Collection Time: 09/14/19  2:36 AM  Result Value Ref Range   Magnesium 2.0 1.7 - 2.4 mg/dL    Comment: Performed at [facility] [address], [place], [place] [address]  Glucose, capillary     Status: Abnormal   Collection Time: 09/14/19  6:06 AM  Result Value Ref Range   Glucose-Capillary 149 (H) 70 - 99 mg/dL   No results found.     Medical Problem List and Plan: 1.  Left hemiparesis, limitations in self-care secondary to cerebral hemisphere watershed infarct with petechial hemorrhage and patient with bilateral cerebral and right occipital infarcts last week as well as pulmonary emboli with DVT.  -patient may shower  -ELOS/Goals: 17-20 days/Min A  Admit to CIR 2.  Antithrombotics: -DVT/anticoagulation: Eliquis  -antiplatelet therapy: N/A 3. Pain Management: Lidoderm patch change as directed, hydrocodone as needed 4. Mood: Valium as needed for anxiety and depression  -antipsychotic agents: N/A 5. Neuropsych: This patient is capable of making decisions on her own behalf. 6. Skin/Wound Care: Routine skin checks 7.  Fluids/Electrolytes/Nutrition: Routine in and outs.  CMP ordered. 8.  PFO.  Follow cardiology services.  TEE completed, suggesting PFO, await further Cards recs.. 9.  Hypertension.  Cardizem 120 mg daily, Lasix 20 mg daily, Cozaar 50 mg daily  Monitor with increased mobility 10.  Hyperlipidemia: Continue Lipitor 11.  Diabetes mellitus with hyperglycemia.  Hemoglobin A1c 9.4.  SSI.  Patient on Glucophage 1000 mg twice daily prior to admission.  Resume as needed  Monitor with increased mobility 12.  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  Continue nebulizer  treatments.  Check oxygen saturations every shift  Monitor with increased exertion 13.  CKD stage II.    CMP ordered 14.  Restless leg syndrome.  Requip 2 mg 3 times daily  Cathlyn Parsons, PA-C 09/14/2019  I have personally performed a face to face diagnostic evaluation, including, but not limited to relevant history and physical exam findings, of this patient and developed relevant assessment and plan.  Additionally, I have reviewed and concur with the physician assistant's documentation above.  Delice Lesch, MD, ABPMR  The patient's status has not changed. The original post admission physician evaluation remains appropriate, and any changes from the pre-admission screening or documentation from the acute chart are noted above.   Delice Lesch, MD, ABPMR

## 2019-09-14 NOTE — Progress Notes (Signed)
Report was given to Benjie Karvonen, RN at rehab. Will transfer patient in an hour.

## 2019-09-14 NOTE — Progress Notes (Signed)
STROKE TEAM PROGRESS NOTE   INTERVAL HISTORY Patient examined in TEE. She had increased left sided weakness today and stat CT head shows no acute changes. She believes that her shoulder pain is responsible and it was hurt y`day while being moved in bed.  Vitals:   09/14/19 0406 09/14/19 0728 09/14/19 0958 09/14/19 1045  BP: (!) 181/72 (!) 150/50 (!) 175/51 (!) 191/55  Pulse: 72 73 75 74  Resp: 19 16 16 16   Temp: 97.9 F (36.6 C) 97.7 F (36.5 C) 98 F (36.7 C) 97.6 F (36.4 C)  TempSrc: Oral Oral Oral Axillary  SpO2: 95% 93% 95% 97%  Weight:      Height:        CBC:  Recent Labs  Lab 09/11/19 1556 09/14/19 0236  WBC 9.9 9.2  NEUTROABS 5.2 4.6  HGB 12.3 11.9*  HCT 38.2 36.9  MCV 88.6 88.3  PLT 315 [id number]    Basic Metabolic Panel:  Recent Labs  Lab 09/11/19 1556 09/11/19 1556 09/12/19 0853 09/14/19 0236  NA 141  --   --  139  K 3.8  --   --  3.9  CL 106  --   --  102  CO2 25  --   --  26  GLUCOSE 124*  --   --  165*  BUN 12  --   --  11  CREATININE 0.79  --   --  0.92  CALCIUM 9.4  --   --  8.7*  MG 1.6*   < > 1.6* 2.0   < > = values in this interval not displayed.   Lipid Panel:     Component Value Date/Time   CHOL 136 08/19/2019 0500   TRIG 70 08/19/2019 0500   HDL 53 08/19/2019 0500   CHOLHDL 2.6 08/19/2019 0500   VLDL 14 08/19/2019 0500   LDLCALC 69 08/19/2019 0500   LDLCALC 98 04/26/2019 0932   HgbA1c:  Lab Results  Component Value Date   HGBA1C 9.4 (H) 08/19/2019   Urine Drug Screen:     Component Value Date/Time   LABOPIA POSITIVE (A) 09/07/2019 1950   COCAINSCRNUR NONE DETECTED 09/07/2019 1950   LABBENZ POSITIVE (A) 09/07/2019 1950   AMPHETMU NONE DETECTED 09/07/2019 1950   THCU NONE DETECTED 09/07/2019 1950   LABBARB NONE DETECTED 09/07/2019 1950    Alcohol Level     Component Value Date/Time   ETH <10 09/07/2019 1623    IMAGING past 48 hours CT HEAD WO CONTRAST  Result Date: 09/14/2019 CLINICAL DATA:  Neuro deficit with acute  stroke suspected EXAM: CT HEAD WITHOUT CONTRAST TECHNIQUE: Contiguous axial images were obtained from the base of the skull through the vertex without intravenous contrast. COMPARISON:  09/11/2019 FINDINGS: Brain: Small patchy acute to subacute infarcts along the bilateral cerebral convexities with subarachnoid hemorrhage along the high and anterior right frontal sulcus, appearance stable from prior MRI. No evidence of acute infarct or interval hemorrhage. No hydrocephalus or worrisome swelling. Vascular: No hyperdense vessel or unexpected calcification. Skull: Normal. Negative for fracture or focal lesion. Sinuses/Orbits: No acute finding. IMPRESSION: Multifocal cortical infarction with small volume subarachnoid hemorrhage in the high right frontal lobe. No new or progressive finding when compared to brain MRI 09/11/2019. Electronically Signed   By: Monte Fantasia M.D.   On: 09/14/2019 11:07    PHYSICAL EXAM Elderly Caucasian lady appears sleepy but not in distress.  Right-sided pulses and blood pressure appear feeble compared to the left side  . Afebrile.  Head is nontraumatic. Neck is supple without bruit.    Cardiac exam no murmur or gallop. Lungs are clear to auscultation. Distal pulses are well felt. Neurological Exam :  She is awake alert oriented to time place and person.  She follows commands well.  Pupils are equal reactive.  Extraocular movements are full range without nystagmus.  She blinks to threat bilaterally.  Mild left lower facial asymmetry.  Tongue midline.  Motor system exam shows mild left hemiparesis with 2-3/5 left upper extremity strength with weakness of grip and intrinsic hand muscles.  Left lower extremity strength is 2-3/5 with drift.  Sensation is preserved bilaterally deep tendon flexes are symmetric plantars downgoing.  Gait not tested. ASSESSMENT/PLAN Ms. NIKERA FURER is a 69 y.o. female with history of hypertension, hyperlipidemia, diabetes mellitus, coronary artery  disease with a recent diagnosis of bilateral strokes in the watershed distribution on 08/18/2019, pulmonary embolism on Xarelto on 1/13, PFO noted on TCD presenting with worsening L sided weakness.    Stroke:   New R>L Cerebral hemisphere watershed infarcts w/ petechial hemorrhage in pt w/ B cerebral and R occipital infarcts last week as well as PE and DVT on Xarelto without missing any doses. Also NEW R frontal lobe SAH. ? .  MRI  Evolution of multifocal subacute watershed infarct B cerebral hemispheres with NEW R>L infarcts with associated petechial hemorrhage in a few. Small SAH R frontal lobe cortical sulcus, new from last week. Stable Small vessel disease. Atrophy.   Carotid stenosis ruled out:  Previous CTA showed R ICA 50% stenosis however carotid doppler with 1-39% R ICA stenosis  LDL 69  HgbA1c 9.4  Was discharged on aspirin and plavix. Changed to Xarelto since d/c for new PE. Given new infarcts on xarelto, change to Eliquis PE dosing.   Agree with plans for hypercoagulable workup   Therapy recommendations:  pending   Disposition:  pending   PFO  Found during follow up  Likely incidental finding and not related to current strokes  PE / R lower leg DVT  Found segmental and subsegmental PE by PCP when c/o of SOB   RLE DVT felt to be provoked in setting of recent shoulder fx  Referred to hematology for hypercoagulable work up, not yet done   On Xarelto PTA (stopped aspirin and plavix started for recent stroke)  Change to Eliquis, PE dose  Hypertension Hypotension in ED w/ BP fluctuation between arms . Variable in ED.  Marland Kitchen Permissive hypertension (OK if < 220/120) but gradually normalize in 5-7 days . Long-term BP goal normotensive  Hyperlipidemia  Home meds:  lipitor 20, resumed in hospital  LDL 69, goal < 70  No not put on intensive statin given LDL < goal and advanced age  Continue statin at discharge  Diabetes type II Uncontrolled  HgbA1c 9.4, goal <  7.0  CBGs Recent Labs    09/13/19 1635 09/13/19 2220 09/14/19 0606  GLUCAP 284* 172* 149*      SSI  Other Stroke Risk Factors  Advanced age  Cigarette smoker, quit ~1 yr ago, advised to stop smoking  Obesity, Body mass index is 34.44 kg/m., recommend weight loss, diet and exercise as appropriate   Hx stroke/TIA  08/2019 - R>L anterior circulation infarct embolic d/t unknown source. Placed on aspirin and plavix x 3 weeks then plavix alone. Found to have PFO as an OP. Found to have PE and started on Xarelto.  Coronary artery disease s/p PCI  Other Active Problems  CKD stage II  COPD/chronic hypoxemic respiratory failure on Home O2 @3L   Hypomagnesemia   Hospital day # 3 Await   transesophageal echocardiogram to further evaluate the PFO to see if it is amenable to endovascular treatment.  Agree with changing Xarelto to Eliquis for anticoagulation.  Discussed with Dr. Vivia Ewing. .Greater than 50% time during this 25-minute visit was spent on counseling and coordination of care and discussion with care team and answering questions.  Antony Contras, MD  To contact Stroke Continuity provider, please refer to [email or web address] After hours, contact General Neurology

## 2019-09-14 NOTE — Progress Notes (Signed)
Jamse Arn, MD  Physician  Physical Medicine and Rehabilitation  PMR Pre-admission  Addendum  Date of Service:  09/14/2019  2:05 PM      Related encounter: ED to Hosp-Admission (Discharged) from 09/11/2019 in [place]        Show:Clear all [x] Manual[x] Template[x] Copied  Added by: [x] Cristina Gong, RN[x] Jamse Arn, MD  [] Hover for details PMR Admission Coordinator Pre-Admission Assessment   Patient: [name] [name] is an 69 y.o., female MRN: [id number] DOB: 03-07-1951 Height: 5\' 4"  (162.6 cm) Weight: 91 kg                                                                                                                                                  Insurance Information HMO:     PPO:      PCP:      IPA:      80/20:      OTHER:  PRIMARY: Medicare a and b      Policy#: [id number]      Subscriber: pt Benefits:  Phone #: passport one online     Name: 09/14/2019 Eff. Date: 08/11/2005     Deduct: $1484      Out of Pocket Max: none      Life Max: none CIR: 100%      SNF: 20 full days Outpatient: 80%     Co-Pay: 20% Home Health: 100%      Co-Pay: none DME: 80%     Co-Pay: 20% Providers: pt choice  SECONDARY: [facility]      Policy#: [id number]      Subscriber: pt   Medicaid Application Date:       Case Manager:  Disability Application Date:       Case Worker:    The "Data Collection Information Summary" for patients in Inpatient Rehabilitation Facilities with attached "Privacy Act [place] Records" was provided and verbally reviewed with: Patient   Emergency Contact Information Contact Information       Name Relation Home Work Mobile    [place] Son [phone]             Current Medical History  Patient Admitting Diagnosis: CVA   History of Present Illness: a 69 year old right-handed female with history of CAD with history of angioplasty, CKD stage II, COPD with chronic hypoxemic respiratory failure maintained on 3 L of  oxygen at home at night, type 2 diabetes mellitus, recent shoulder fracture nonsurgical managed with a sling, hypertension, hyperlipidemia, thoracic aortic aneurysm, patient quit smoking 12 months ago.  History taken from chart review and patient. Patient lives alone.  1 level home with a ramped entrance.  Used a walker occasionally prior to admission.  Her son did help some basic ADLs.  Patient had been admitted 08/18/2019 to 08/20/2019 for acute CVA with MRI showing watershed infarction in  bilateral hemispheres.  CT angiogram head and neck revealed 50% stenosis of the proximal right internal carotid artery due to bulky atherosclerotic calcification.  Carotid Dopplers with no ICA stenosis.  TTE with normal EF.  Patient was discharged home on aspirin and Plavix x3 months then Plavix alone.  On 08/23/2019 hospital evaluation by PCP noted to have shortness of breath where CT angiogram of the chest showed acute bilateral segmental subsegmental pulmonary emboli.  Subsequent DVT scan showed right lower extremity DVT involving the right peroneal veins.  Patient was started on Xarelto replacing aspirin and Plavix.  On 08/30/2019 bubble study done by outpatient neurology showed a small PFO but it was not felt large enough to allow for emboli to pass from right to left side of heart.  Patient presented to the ED on 09/07/2019 due to left hemi-paresis. CT of the head as well as MRI of the brain showed evolution of old stroke without new infarcts. As a result, patient was subsequently discharged to home.  She presented on 09/11/2019 with increasing left-sided weakness as well as hypotension.  She did receive 500 mL fluid bolus.  MRI and imaging revealed continued interval evolution of previously seen subacute infarcts with multiple new scattered acute ischemic watershed type infarctions involving the bilateral cerebral hemispheres, right greater than left.  Associated petechial hemorrhage about a few of these infarcts without frank  hemorrhagic transformation.  Small volume subarachnoid hemorrhage layering along the cortical sulcus within the right frontal lobe, new from previous tracing.  Neurology consulted hypercoagulable work-up.  Patient Xarelto has been changed to Eliquis given new infarctions and PE.  A follow-up stat cranial CT scan was completed this morning for increased left side weakness. Personally reviewed, no acute intracranial abnormalities.  However, given current deficits, suspect futher extension. Discussed with Neurology weakness, PFO, and plans for possible intervention.  Cardiology service follow-up in regards to recent findings of PFO. TEE completed 2/3/2021results pending and if PFO larger than anticipated or high risk anatomy with an atrial septal aneurysm consider plan percutaneous transcatheter PFO closure, likely as outpatient or after CIR recovery. Please see preadmission assessment from earlier today as well.    Complete NIHSS TOTAL: 8 Glasgow Coma Scale Score: 15   Past Medical History      Past Medical History:  Diagnosis Date  . Aneurysm, thoracic aortic ([place])    . Anxiety    . Arthritis    . CAD (coronary artery disease)    . Chronic kidney disease      Renal insufficiency  . COPD (chronic obstructive pulmonary disease) ([place])    . Depression    . Diabetes mellitus    . Diverticulosis of colon (without mention of hemorrhage)    . Duodenitis without mention of hemorrhage    . Embolic stroke ([place])    . GERD (gastroesophageal reflux disease)    . Hemorrhoids    . Hypercholesteremia    . Hypertension    . Insomnia    . Pulmonary embolism ([place])    . Tinnitus    . Vertigo        Family History  family history includes Breast cancer in her sister; Colon polyps in her father and sister; Diabetes in her brother and mother; Heart disease in her father; Hemophilia in her mother.   Prior Rehab/Hospitalizations:  Has the patient had prior rehab or hospitalizations prior to admission? Yes     Has the patient had major surgery during 100 days prior to admission? Yes  Current Medications    Current Facility-Administered Medications:  .  [MAR Hold]  stroke: mapping our early stages of recovery book, , Does not apply, Once, Shela Leff, MD .  Doug Sou Hold] acetaminophen (TYLENOL) tablet 650 mg, 650 mg, Oral, Q4H PRN, 650 mg at 09/14/19 0813 **OR** [MAR Hold] acetaminophen (TYLENOL) 160 MG/5ML solution 650 mg, 650 mg, Per Tube, Q4H PRN **OR** [MAR Hold] acetaminophen (TYLENOL) suppository 650 mg, 650 mg, Rectal, Q4H PRN, Shela Leff, MD .  Doug Sou Hold] apixaban (ELIQUIS) tablet 5 mg, 5 mg, Oral, BID, Shela Leff, MD, 5 mg at 09/14/19 [id number] .  [MAR Hold] atorvastatin (LIPITOR) tablet 20 mg, 20 mg, Oral, QHS, Shela Leff, MD, 20 mg at 09/13/19 2216 .  [MAR Hold] diazepam (VALIUM) tablet 5 mg, 5 mg, Oral, Q12H PRN, Shela Leff, MD, 5 mg at 09/12/19 0200 .  [MAR Hold] diltiazem (CARDIZEM CD) 24 hr capsule 120 mg, 120 mg, Oral, Daily, Dahal, Binaya, MD, 120 mg at 09/14/19 0812 .  [MAR Hold] furosemide (LASIX) tablet 20 mg, 20 mg, Oral, Daily, Dahal, Binaya, MD, 20 mg at 09/14/19 0813 .  [MAR Hold] HYDROcodone-acetaminophen (NORCO) 10-325 MG per tablet 1 tablet, 1 tablet, Oral, Q6H PRN, Terrilee Croak, MD, 1 tablet at 09/14/19 0417 .  [MAR Hold] insulin aspart (novoLOG) injection 0-5 Units, 0-5 Units, Subcutaneous, QHS, Shela Leff, MD, 2 Units at 09/12/19 2135 .  [MAR Hold] insulin aspart (novoLOG) injection 0-9 Units, 0-9 Units, Subcutaneous, TID WC, Shela Leff, MD, 1 Units at 09/14/19 [phone] .  [MAR Hold] ipratropium-albuterol (DUONEB) 0.5-2.5 (3) MG/3ML nebulizer solution 3 mL, 3 mL, Nebulization, Q6H PRN, Shela Leff, MD .  Doug Sou Hold] lidocaine (LIDODERM) 5 % 1 patch, 1 patch, Transdermal, Q24H, Dahal, Binaya, MD, 1 patch at 09/14/19 1118 .  [MAR Hold] losartan (COZAAR) tablet 50 mg, 50 mg, Oral, Daily, Dahal, Binaya, MD, 50 mg at 09/14/19  0813 .  [MAR Hold] rOPINIRole (REQUIP) tablet 2 mg, 2 mg, Oral, TID, Dahal, Binaya, MD, 2 mg at 09/14/19 0834 .  [MAR Hold] senna-docusate (Senokot-S) tablet 1 tablet, 1 tablet, Oral, QHS PRN, Shela Leff, MD, 1 tablet at 09/12/19 2133   Patients Current Diet:  Diet Order       None           Precautions / Restrictions Precautions Precautions: Fall Precaution Comments: L Humeral fx Restrictions Weight Bearing Restrictions: No    Has the patient had 2 or more falls or a fall with injury in the past year?No   Prior Activity Level Community (5-7x/wk): Indpendent until few days pta   Prior Functional Level Prior Function Level of Independence: Needs assistance Gait / Transfers Assistance Needed: using walker occasionally, but got worse over day and half pta ADL's / Homemaking Assistance Needed: son helped with shower Comments: drives and does cooking and cleaning, still doing it since shoulder injury, just lost husband in July due to suicide   Self Care: Did the patient need help bathing, dressing, using the toilet or eating?  Independent   Indoor Mobility: Did the patient need assistance with walking from room to room (with or without device)? Independent   Stairs: Did the patient need assistance with internal or external stairs (with or without device)? Independent   Functional Cognition: Did the patient need help planning regular tasks such as shopping or remembering to take medications? Independent   Home Assistive Devices / Equipment Home Assistive Devices/Equipment: Wheelchair Home Equipment: Environmental consultant - 2 wheels, Wheelchair - manual, Shower seat, Grab bars -  toilet, Grab bars - tub/shower   Prior Device Use: Indicate devices/aids used by the patient prior to current illness, exacerbation or injury? Walker   Current Functional Level Cognition   Arousal/Alertness: Awake/alert Overall Cognitive Status: Within Functional Limits for tasks assessed Orientation  Level: Oriented to person Problem Solving: Appears intact Safety/Judgment: Appears intact    Extremity Assessment (includes Sensation/Coordination)   Upper Extremity Assessment: LUE deficits/detail LUE Deficits / Details: AROM shoulder NT due to pain from preior injury, she used R UE to assist with lifting and placing L hand onto RW LUE Sensation: WNL LUE Coordination: decreased fine motor, decreased gross motor  Lower Extremity Assessment: Defer to PT evaluation LLE Deficits / Details: limited ankle DF due to weakness (strength 2+/5); knee extension 4-/5, reports numbness both feet from PN, LLE Sensation: decreased light touch     ADLs   Overall ADL's : Needs assistance/impaired Eating/Feeding: Set up Eating/Feeding Details (indicate cue type and reason): to open containers Grooming: Wash/dry hands, Wash/dry face, Oral care, Standing, Minimal assistance Upper Body Bathing: Sitting, Moderate assistance Upper Body Bathing Details (indicate cue type and reason): d/t L shoulder pain and weakness Lower Body Bathing: Sit to/from stand, Moderate assistance Upper Body Dressing : Sitting, Moderate assistance Lower Body Dressing: Sit to/from stand, Maximal assistance Toilet Transfer: Ambulation, RW, Minimal assistance General ADL Comments: min - mod A sit <> stand and min A stand pivot transfer with RW     Mobility   Overal bed mobility: Needs Assistance Bed Mobility: Supine to Sit Supine to sit: HOB elevated, Min assist Sit to supine: Min assist General bed mobility comments: cues to use rail, assist for trunk, increased time scooting to EOB     Transfers   Overall transfer level: Needs assistance Equipment used: Rolling walker (2 wheeled) Transfers: Sit to/from Stand, [employer] Transfers Sit to Stand: Mod assist, Min assist Stand pivot transfers: Min assist General transfer comment: cues for safe hand placement and technique to initaite power up and rise to RW. HHA/support provided  at Lt UE to raise it to the RW.     Ambulation / Gait / Stairs / Wheelchair Mobility   Ambulation/Gait Ambulation/Gait assistance: Herbalist (Feet): 20 Feet(20 ft total (1x13', 1x7')) Assistive device: Rolling walker (2 wheeled) Gait Pattern/deviations: Step-to pattern, Decreased stride length, Decreased dorsiflexion - left, Scissoring, Step-through pattern General Gait Details: visual cues provided for Lt foot step length/foot placement; pt continues with scissoring step pattern and required min assist ~ 50% of time for Lt foot positioning. pt ambulated 2 bouts in hospital room with seated rest break in recliner between bouts due to fatigue and Lt shoulder pain. Gait velocity: decreased     Posture / Balance Balance Overall balance assessment: Needs assistance Sitting-balance support: Feet supported Sitting balance-Leahy Scale: Fair Standing balance support: Bilateral upper extremity supported, During functional activity Standing balance-Leahy Scale: Poor Standing balance comment: relaint on UE support for balance     Special needs/care consideration BiPAP/CPAP CPM Continuous Drip IV Dialysis         Life Vest Oxygen Special Bed Trach Size Wound Vac Skin                               Bowel mgmt:continent Bladder mgmt:external catheter Diabetic mgmt Hgb A1c 9.4 Behavioral consideration  Chemo/radiation  Designated visitor is John Followed by [facility]    Previous Home Environment  Living Arrangements: (son and his family  live next door)  Lives With: Alone Available Help at Discharge: Family, Available 107 hours/day(32 year old grand daughter doing remote school and can be wi) Type of Home: House Home Layout: One level Home Access: Ramped entrance Bathroom Shower/Tub: Multimedia programmer: Handicapped height Bathroom Accessibility: Yes [place]: No Additional Comments: 39 y/o granddaughter helps during the day and son and daughter in law help  at night and weekends per pt report.   Discharge Living Setting Plans for Discharge Living Setting: Patient's home, Alone Type of Home at Discharge: House Discharge Home Layout: One level Discharge Home Access: [place] entrance Discharge Bathroom Shower/Tub: Walk-in shower Discharge Bathroom Toilet: Handicapped height Discharge Bathroom Accessibility: Yes How Accessible: Accessible via walker Does the patient have any problems obtaining your medications?: No   Social/Family/Support Systems Patient Roles: Parent Contact Information: son, [name] Anticipated Caregiver: son, daughter in law and 87 year old grand daughter Anticipated Caregiver's Contact Information: see above Ability/Limitations of Caregiver: son works days; 47 year old asisst during the day Caregiver Availability: 24/7 Discharge Plan Discussed with Primary Caregiver: Yes Is Caregiver In Agreement with Plan?: Yes Does Caregiver/Family have Issues with Lodging/Transportation while Pt is in Rehab?: No   Goals/Additional Needs Patient/Family Goal for Rehab: Min A with PT, OT, and SLP Expected length of stay: ELOS 17-21 days. Pt/Family Agrees to Admission and willing to participate: Yes Program Orientation Provided & Reviewed with Pt/Caregiver Including Roles  & Responsibilities: Yes   Decrease burden of Care through IP rehab admission:  Possible need for SNF placement upon discharge:   Patient Condition: This patient's condition remains as documented in the consult dated 09/13/2019, in which the Rehabilitation Physician determined and documented that the patient's condition is appropriate for intensive rehabilitative care in an inpatient rehabilitation facility. Will admit to inpatient rehab today.   Preadmission Screen Completed By:  Cleatrice Burke, RN, 09/14/2019 2:05 PM ______________________________________________________________________   Discussed status with Dr. Posey Pronto on 09/14/2019 at  1458 and received approval  for admission today.   Admission Coordinator:  Cleatrice Burke, time [id number] Date 09/14/2019     Revision History

## 2019-09-14 NOTE — Progress Notes (Signed)
[name] Ribas, MD  Physician  Physical Medicine and Rehabilitation  Consult Note  Signed  Date of Service:  09/13/2019  5:21 AM      Related encounter: ED to Hosp-Admission (Discharged) from 09/11/2019 in [facility]      Signed      Expand AllCollapse All   Show:Clear all [x] Manual[x] Template[] Copied  Added by: [x] Angiulli, Lavon Paganini, PA-C[x] Raulkar, Clide Deutscher, MD  [] Hover for details          Physical Medicine and Rehabilitation Consult Reason for Consult: Left side weakness Referring Physician: [facility]     HPI: [name] [name] is a 69 y.o. right-handed female with history of CAD, CKD stage II, COPD with chronic hypoxemic respiratory failure on 3 L oxygen at home at night, type 2 diabetes mellitus, recent shoulder fracture nonsurgical managed with a sling, hypertension, hyperlipidemia, thoracic aortic aneurysm, patient quit smoking 12 months ago .  Per chart review patient lives alone.  1 level home with ramped entrance.  Used a walker occasionally prior to admission.  Her son did help with some basic ADLs.  Patient had been admitted 08/18/2019 to 08/20/2019 for acute CVA with MRI showing watershed infarcts in bilateral hemispheres.  CT angiogram head and neck revealed 50% stenosis of the proximal right internal carotid artery due to bulky atherosclerotic calcification.  Carotid Dopplers with no ICA stenosis.  Echocardiogram normal EF no acute findings.  Patient was discharged home on aspirin and Plavix x3 months then Plavix alone.  On 08/23/2019 patient post hospital evaluation by PCP noted to have shortness of breath CT angio of chest showed acute bilateral segmental subsegmental pulmonary bleeding.  Subsequent DVT scan showed right lower extremity DVT.  Patient was started on Xarelto replacing aspirin and Plavix.  08/30/2019 bubble study done by outpatient neurology showed a small PFO but it was not felt large enough to allow for emboli the past from right to left  side of heart.  Patient presented to the ED 09/07/2019 for left-sided weakness CT of head as well as MRI of the brain showed evolution of old strokes with no new infarcts and hence patient was subsequently discharged to home.  Presented 09/11/2019 with increasing left-sided weakness as well as hypotension.  Patient did receive 500 mL fluid bolus.  MRI and imaging revealed continued interval evolution of previously seen subacute infarcts, with multiple new scattered acute ischemic watershed type infarctions involving the bilateral cerebral hemispheres, right greater than left.  Associated petechial hemorrhage about a few of these infarcts without frank hemorrhagic transformation.  Small volume subarachnoid hemorrhage layering along the cortical sulcus within the right frontal lobe, new from previous tracing.  Neurology has been consulted for work-up plan hypercoagulable work-up study.  Patient Xarelto has been changed to Eliquis given new infarcts.  Cardiology services consulted in regards to PFO question need for closure and await input.  Tolerating a regular consistency diet.  Therapy evaluations completed with recommendations of physical medicine rehab consult.     Review of Systems  Constitutional: Negative for chills and fever.  HENT: Positive for tinnitus.   Eyes: Negative for blurred vision and double vision.  Respiratory: Positive for shortness of breath.   Cardiovascular: Positive for leg swelling. Negative for chest pain and palpitations.  Gastrointestinal: Positive for constipation. Negative for heartburn and nausea.       GERD  Genitourinary: Negative for dysuria, flank pain and hematuria.  Musculoskeletal: Positive for joint pain and myalgias.  Skin: Negative for rash.  Neurological:  Positive for dizziness and weakness.  Psychiatric/Behavioral: Positive for depression. The patient has insomnia.        Anxiety  All other systems reviewed and are negative.       Past Medical History:    Diagnosis Date  . Aneurysm, thoracic aortic ([place])    . Anxiety    . Arthritis    . CAD (coronary artery disease)    . Chronic kidney disease      Renal insufficiency  . COPD (chronic obstructive pulmonary disease) ([place])    . Depression    . Diabetes mellitus    . Diverticulosis of colon (without mention of hemorrhage)    . Duodenitis without mention of hemorrhage    . Embolic stroke ([place])    . GERD (gastroesophageal reflux disease)    . Hemorrhoids    . Hypercholesteremia    . Hypertension    . Insomnia    . Pulmonary embolism ([place])    . Tinnitus    . Vertigo           Past Surgical History:  Procedure Laterality Date  . ANGIOPLASTY      . CHOLECYSTECTOMY      . ROTATOR CUFF REPAIR Right 04/2012  . TUBAL LIGATION             Family History  Problem Relation Age of Onset  . Diabetes Mother    . Hemophilia Mother    . Breast cancer Sister    . Heart disease Father    . Colon polyps Father    . Diabetes Brother    . Colon polyps Sister      Social History:  reports that she quit smoking about 13 months ago. Her smoking use included cigarettes. She has never used smokeless tobacco. She reports that she does not drink alcohol or use drugs. Allergies:       Allergies  Allergen Reactions  . Guaifenesin & Derivatives Anaphylaxis      Severe Reaction  . Tape Other (See Comments)      TAPE PEELS OFF THE SKIN!!!!!!!  . Benazepril Cough  . Morphine Nausea Only  . Zyrtec [Cetirizine] Swelling and Other (See Comments)      Hands and feet became swollen  . Latex Rash and Other (See Comments)      No powdered gloves!!          Medications Prior to Admission  Medication Sig Dispense Refill  . rivaroxaban (XARELTO) 20 MG TABS tablet Take 1 tablet (20 mg total) by mouth daily with supper. 30 tablet 2  . albuterol (VENTOLIN HFA) 108 (90 Base) MCG/ACT inhaler Inhale 2 puffs into the lungs every 4 (four) hours as needed for wheezing or shortness of breath. 54 g 3  .  atorvastatin (LIPITOR) 20 MG tablet Take 1 tablet (20 mg total) by mouth daily. (Patient taking differently: Take 20 mg by mouth at bedtime. ) 90 tablet 3  . bimatoprost (LUMIGAN) 0.01 % SOLN Place 1 drop into both eyes at bedtime.      . brinzolamide (AZOPT) 1 % ophthalmic suspension Place 1 drop into both eyes every 12 (twelve) hours.       . clopidogrel (PLAVIX) 75 MG tablet Take 1 tablet (75 mg total) by mouth daily. (Patient not taking: Reported on 09/07/2019) 30 tablet 1  . Continuous Blood Gluc Sensor (FREESTYLE LIBRE 14 DAY SENSOR) MISC CHANGE EVERY 14 DAYS (Patient taking differently: Inject 1 patch into the skin every 14 (fourteen) days. )  2 each 3  . dexlansoprazole (DEXILANT) 60 MG capsule Take 1 capsule (60 mg total) by mouth daily. 90 capsule 3  . diazepam (VALIUM) 5 MG tablet Take 1 tablet (5 mg total) by mouth every 12 (twelve) hours as needed for anxiety (insomnia). (Patient taking differently: Take 5 mg by mouth every 12 (twelve) hours as needed (anxiety or insomnia). ) 60 tablet 1  . diltiazem (CARDIZEM) 120 MG tablet Take 120 mg by mouth daily.      . ergocalciferol (VITAMIN D2) 1.25 MG (50000 UT) capsule Take 1 capsule (50,000 Units total) by mouth once a week. (Patient taking differently: Take 50,000 Units by mouth every Tuesday. ) 24 capsule 0  . escitalopram (LEXAPRO) 10 MG tablet Take 1 tablet (10 mg total) by mouth daily. 90 tablet 3  . furosemide (LASIX) 40 MG tablet Take 1 tablet (40 mg total) by mouth daily. 30 tablet 3  . HYDROcodone-acetaminophen (NORCO) 10-325 MG per tablet Take 1 tablet by mouth every 6 (six) hours as needed for moderate pain.       Marland Kitchen ipratropium-albuterol (DUONEB) 0.5-2.5 (3) MG/3ML SOLN INHALE 1 VIAL VIA NEBULIZER EVERY 6 HOURS AS NEEDED (Patient taking differently: Take 3 mLs by nebulization every 6 (six) hours as needed (as directed). ) 360 mL 0  . losartan (COZAAR) 100 MG tablet Take 1 tablet (100 mg total) by mouth daily. 90 tablet 3  . metFORMIN  (GLUCOPHAGE) 1000 MG tablet Take 1 tablet (1,000 mg total) by mouth 2 (two) times daily with a meal. 60 tablet 1  . OXYGEN Inhale 2 L into the lungs at bedtime.       . potassium chloride SA (KLOR-CON) 20 MEQ tablet Take 1 tablet (20 mEq total) by mouth daily. 5 tablet 0  . rOPINIRole (REQUIP) 2 MG tablet Take 1 tablet (2 mg total) by mouth 3 (three) times daily. (Patient taking differently: Take 2 mg by mouth at bedtime. ) 270 tablet 3  . Tiotropium Bromide-Olodaterol 2.5-2.5 MCG/ACT AERS Inhale 2 puffs into the lungs daily. (Patient taking differently: Inhale 1 puff into the lungs daily. ) 12 g 3      Home: Home Living Family/patient expects to be discharged to:: Private residence Living Arrangements: Alone Available Help at Discharge: Family(grandaughter helps during the day and sister in law helps when she can) Type of Home: House Home Access: Ramped entrance(w/c lift) Home Layout: One level Bathroom Shower/Tub: Multimedia programmer: Handicapped height Home Equipment: Environmental consultant - 2 wheels, Wheelchair - manual, Shower seat, Grab bars - toilet, Grab bars - tub/shower  Functional History: Prior Function Level of Independence: Needs assistance Gait / Transfers Assistance Needed: using walker occasionally, but got worse over day and half pta ADL's / Homemaking Assistance Needed: son helped with shower Functional Status:  Mobility: Bed Mobility Overal bed mobility: Needs Assistance Bed Mobility: Supine to Sit Supine to sit: HOB elevated Sit to supine: Min assist General bed mobility comments: cues to use rail, assist for trunk, increased time scooting to EOB Transfers Overall transfer level: Needs assistance Equipment used: 1 person hand held assist, Rolling walker (2 wheeled) Transfers: Sit to/from Stand, [employer] Transfers Sit to Stand: Min assist Stand pivot transfers: Min assist General transfer comment: assist for balance/safety, first HHA on L and using footboard on  R, then with RW, then stood to pivot to recliner Ambulation/Gait Ambulation/Gait assistance: Min assist Gait Distance (Feet): 8 Feet Assistive device: Rolling walker (2 wheeled) Gait Pattern/deviations: Step-to pattern, Decreased stride length,  Decreased dorsiflexion - left, Scissoring General Gait Details: needing to look at L foot to make sure foot clears then cues for preventing crossing over with steps, stayed at bedside with RW   ADL:   Cognition: Cognition Overall Cognitive Status: Within Functional Limits for tasks assessed Orientation Level: Oriented X4 Cognition Arousal/Alertness: Awake/alert Behavior During Therapy: WFL for tasks assessed/performed Overall Cognitive Status: Within Functional Limits for tasks assessed   Blood pressure (!) 173/69, pulse 81, temperature 98 F (36.7 C), temperature source Oral, resp. rate 18, height 5\' 4"  (1.626 m), weight 91 kg, SpO2 93 %.   Physical Exam General: Alert and oriented x 3, No apparent distress. Appears fatigued.  HEENT: Head is normocephalic, atraumatic, PERRLA, EOMI, sclera anicteric, oral mucosa pink and moist, dentition intact, ext ear canals clear,  Neck: Supple without JVD or lymphadenopathy Heart: Reg rate and rhythm. No murmurs rubs or gallops Chest: CTA bilaterally without wheezes, rales, or rhonchi; no distress Abdomen: Soft, non-tender, non-distended, bowel sounds positive. Extremities: No clubbing, cyanosis, or edema. Pulses are 2+ Skin: Clean and intact without signs of breakdown Neurological: Patient is alert in no acute distress.  Makes eye contact with examiner.  Follows simple commands.  Provides her name and age however she does exhibit some decrease in safety awareness. MSK: Puts forth excellent effort. 5/5 strength on right side. LUE with 3/5 SA, EF, EE, 5/5 WE, 5/5 hand grip. LLE with 4-/5 HF, KE, 0/5 DF.  Psych: Pt's affect is appropriate. Pt is cooperative       Lab Results Last 24 Hours       Results  for orders placed or performed during the hospital encounter of 09/11/19 (from the past 24 hour(s))  CBG monitoring, ED     Status: Abnormal    Collection Time: 09/12/19  7:35 AM  Result Value Ref Range    Glucose-Capillary 161 (H) 70 - 99 mg/dL  Magnesium     Status: Abnormal    Collection Time: 09/12/19  8:53 AM  Result Value Ref Range    Magnesium 1.6 (L) 1.7 - 2.4 mg/dL  Glucose, capillary     Status: Abnormal    Collection Time: 09/12/19 12:01 PM  Result Value Ref Range    Glucose-Capillary 146 (H) 70 - 99 mg/dL  Glucose, capillary     Status: Abnormal    Collection Time: 09/12/19  4:47 PM  Result Value Ref Range    Glucose-Capillary 151 (H) 70 - 99 mg/dL  Glucose, capillary     Status: Abnormal    Collection Time: 09/12/19  9:23 PM  Result Value Ref Range    Glucose-Capillary 226 (H) 70 - 99 mg/dL       Imaging Results (Last 48 hours)  MR BRAIN WO CONTRAST   Result Date: 09/11/2019 CLINICAL DATA:  Initial evaluation for acute difficulty walking, stroke suspected. EXAM: MRI HEAD WITHOUT CONTRAST TECHNIQUE: Multiplanar, multiecho pulse sequences of the brain and surrounding structures were obtained without intravenous contrast. COMPARISON:  Comparison made with multiple recent brain MRIs, most recent of which from 09/07/2019. FINDINGS: Brain: Generalized age-related cerebral atrophy with chronic microvascular ischemic disease again noted. Multiple remote lacunar infarcts present within the hemispheric cerebral white matter. Continued interval evolution of previously seen subacute watershed type infarcts involving the bilateral cerebral hemispheres. However, multiple new watershed type infarcts are now seen on today's exam, increased in size and number but with similar distribution as compared to previous. Overall, involvement is slightly worse within the right cerebral hemisphere as  compared to the left. New patchy involvement of the bilateral parieto-occipital regions as well, also  greater on the right (series 5, image 66). Now seen are a few scattered foci of susceptibility artifact associated with these infarcts, consistent with associated petechial hemorrhage (series 14, image 46). Additionally, there is a serpiginous focus of FLAIR and susceptibility artifact seen layering along a cortical sulcus within the right frontal lobe, concerning for a small volume subarachnoid hemorrhage, new from previous (series 14, image 46). No frank intraparenchymal hematoma. No significant mass effect. No mass lesion or midline shift. No hydrocephalus. No extra-axial fluid collection. Pituitary gland suprasellar region normal. Midline structures intact. Vascular: Major intracranial vascular flow voids are maintained. Skull and upper cervical spine: Craniocervical junction normal. Upper cervical spine within normal limits. Bone marrow signal intensity normal. No scalp soft tissue abnormality. Sinuses/Orbits: Globes and orbital soft tissues within normal limits. Paranasal sinuses remain largely clear. No significant mastoid effusion. Other: None. IMPRESSION: 1. Continued interval evolution of previously seen subacute infarcts, with multiple new scattered acute ischemic watershed type infarcts involving the bilateral cerebral hemispheres, right greater than left. Associated petechial hemorrhage about a few of these infarcts without frank hemorrhagic transformation. 2. Small volume subarachnoid hemorrhage layering along the cortical sulcus within the right frontal lobe, new from previous. 3. Otherwise stable exam with underlying cerebral atrophy, chronic microvascular ischemic disease, and multiple remote lacunar infarcts involving the hemispheric cerebral white matter. Critical Value/emergent results were called by telephone at the time of interpretation on 09/11/2019 at 8:43 pm to provider [facility] , who verbally acknowledged these results. Electronically Signed   By: Jeannine Boga M.D.   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 Does the need for close, 24 hr/day medical supervision in concert with the patient's rehab needs make it unreasonable for this patient to be served in a less intensive setting? Yes 2. Co-Morbidities requiring supervision/potential complications: CAD< CKD stage II, type 2 diabetes, HTN, HLD, thoracic aortic aneurysm, COPD/chronic hypoxemic respiratory failure on 3L home O2.  3. Due to safety, skin/wound care, disease management, medication administration, pain management and patient education, does the patient require 24 hr/day rehab nursing? Yes 4. Does the patient require coordinated care of a physician, rehab nurse, therapy disciplines of PT, OT to address physical and functional deficits in the context of the above medical diagnosis(es)? Yes Addressing deficits in the following areas: balance, endurance, locomotion, strength, transferring, bowel/bladder control, bathing, dressing, feeding, grooming, toileting and psychosocial support 5. Can the patient actively participate in an intensive therapy program of at least 3 hrs of therapy per day at least 5 days per week? Yes 6. The potential for patient to make measurable gains while on inpatient rehab is excellent 7. Anticipated functional outcomes upon discharge from inpatient rehab are modified independent  with PT, modified independent with OT, independent with SLP. 8. Estimated rehab length of stay to reach the above functional goals is: 10-12 days 9. Anticipated discharge destination: Home 10. Overall Rehab/Functional Prognosis: excellent   RECOMMENDATIONS: This patient's condition is appropriate for continued rehabilitative care in the following setting: CIR Patient has agreed to participate in recommended program. Yes Note that insurance prior authorization may be required for reimbursement for recommended care.     Comment:  Disposition: Patient would be an excellent CIR candidate! Her 57 year old granddaughter lives with her and can provide assistance during the day. Her son lives opposite  her and can take time off work to help her as per patient.  Left shoulder pain: chronic, and likely worsened by current hemiparesis. Please consider applying lidocaine patch for relief. Patient says this has helped her in the past. Low back pain: Positional. Encourage OOB as much as tolerated. Please provide Kpad for relief.  LLE impaired dorsiflexion: PRAFO to prevent plantarflexion contracture. Moving bowels regularly, sleeping well at night.     Lavon Paganini Angiulli, PA-C 09/13/2019    AC, PT, hospitalist, neurology notes reviewed. MRI brain personally reviewed and shows diffuse watershed infarcts with interval evolution, right>left. Discussed with OT at bedside. I have personally performed a face to face diagnostic evaluation, including, but not limited to relevant history and physical exam findings, of this patient and developed relevant assessment and plan.  Additionally, I have reviewed and concur with the physician assistant's documentation above.   Leeroy Cha, MD        Revision History                     Routing History

## 2019-09-14 NOTE — Progress Notes (Signed)
Inpatient Rehabilitation Admissions Coordinator  Discussed with Dr. Dahal and Dr. Ankit Patel . We will plan admit to CIR today. I met with patient an her son at bedside and they are in agreement. RN CM and SW made aware. I will make the arrangements to admit today.  Barbara Boyette, RN, MSN Rehab Admissions Coordinator [phone] 09/14/2019 2:55 PM  

## 2019-09-14 NOTE — Transfer of Care (Signed)
Immediate Anesthesia Transfer of Care Note  Patient: [name] [name]  Procedure(s) Performed: TRANSESOPHAGEAL ECHOCARDIOGRAM (TEE) (N/A ) BUBBLE STUDY  Patient Location: PACU  Anesthesia Type:MAC  Level of Consciousness: drowsy  Airway & Oxygen Therapy: Patient Spontanous Breathing and Patient connected to nasal cannula oxygen  Post-op Assessment: Report given to RN and Post -op Vital signs reviewed and stable  Post vital signs: Reviewed and stable  Last Vitals:  Vitals Value Taken Time  BP 117/42 09/14/19 1343  Temp    Pulse    Resp 23 09/14/19 1344  SpO2    Vitals shown include unvalidated device data.  Last Pain:  Vitals:   09/14/19 1045  TempSrc: Axillary  PainSc: 8       Patients Stated Pain Goal: 1 (95/97/47 1855)  Complications: No apparent anesthesia complications

## 2019-09-15 ENCOUNTER — Inpatient Hospital Stay (HOSPITAL_COMMUNITY): Payer: Medicare Other | Admitting: Speech Pathology

## 2019-09-15 ENCOUNTER — Inpatient Hospital Stay (HOSPITAL_COMMUNITY): Payer: Medicare Other | Admitting: Physical Therapy

## 2019-09-15 ENCOUNTER — Inpatient Hospital Stay (HOSPITAL_COMMUNITY): Payer: Medicare Other | Admitting: Occupational Therapy

## 2019-09-15 DIAGNOSIS — R414 Neurologic neglect syndrome: Secondary | ICD-10-CM

## 2019-09-15 DIAGNOSIS — E1169 Type 2 diabetes mellitus with other specified complication: Secondary | ICD-10-CM

## 2019-09-15 DIAGNOSIS — I10 Essential (primary) hypertension: Secondary | ICD-10-CM

## 2019-09-15 DIAGNOSIS — E669 Obesity, unspecified: Secondary | ICD-10-CM

## 2019-09-15 DIAGNOSIS — I6389 Other cerebral infarction: Secondary | ICD-10-CM

## 2019-09-15 LAB — COMPREHENSIVE METABOLIC PANEL
ALT: 24 U/L (ref 0–44)
AST: 22 U/L (ref 15–41)
Albumin: 3.2 g/dL — ABNORMAL LOW (ref 3.5–5.0)
Alkaline Phosphatase: 47 U/L (ref 38–126)
Anion gap: 9 (ref 5–15)
BUN: 11 mg/dL (ref 8–23)
CO2: 24 mmol/L (ref 22–32)
Calcium: 9.2 mg/dL (ref 8.9–10.3)
Chloride: 103 mmol/L (ref 98–111)
Creatinine, Ser: 0.7 mg/dL (ref 0.44–1.00)
GFR calc Af Amer: >60 mL/min (ref >60)
GFR calc non Af Amer: >60 mL/min (ref >60)
Glucose, Bld: 180 mg/dL — ABNORMAL HIGH (ref 70–99)
Potassium: 3.3 mmol/L — ABNORMAL LOW (ref 3.5–5.1)
Sodium: 136 mmol/L (ref 135–145)
Total Bilirubin: 0.4 mg/dL (ref 0.3–1.2)
Total Protein: 6.4 g/dL — ABNORMAL LOW (ref 6.5–8.1)

## 2019-09-15 LAB — CBC WITH DIFFERENTIAL/PLATELET
Abs Immature Granulocytes: 0.06 10*3/uL (ref 0.00–0.07)
Basophils Absolute: 0 10*3/uL (ref 0.0–0.1)
Basophils Relative: 0 %
Eosinophils Absolute: 0.3 10*3/uL (ref 0.0–0.5)
Eosinophils Relative: 3 %
HCT: 37.6 % (ref 36.0–46.0)
Hemoglobin: 12.6 g/dL (ref 12.0–15.0)
Immature Granulocytes: 1 %
Lymphocytes Relative: 32 %
Lymphs Abs: 3 10*3/uL (ref 0.7–4.0)
MCH: 29 pg (ref 26.0–34.0)
MCHC: 33.5 g/dL (ref 30.0–36.0)
MCV: 86.6 fL (ref 80.0–100.0)
Monocytes Absolute: 0.8 10*3/uL (ref 0.1–1.0)
Monocytes Relative: 8 %
Neutro Abs: 5.3 10*3/uL (ref 1.7–7.7)
Neutrophils Relative %: 56 %
Platelets: 313 10*3/uL (ref 150–400)
RBC: 4.34 MIL/uL (ref 3.87–5.11)
RDW: 15.3 % (ref 11.5–15.5)
WBC: 9.5 10*3/uL (ref 4.0–10.5)
nRBC: 0 % (ref 0.0–0.2)

## 2019-09-15 LAB — GLUCOSE, CAPILLARY
Glucose-Capillary: 158 mg/dL — ABNORMAL HIGH (ref 70–99)
Glucose-Capillary: 213 mg/dL — ABNORMAL HIGH (ref 70–99)
Glucose-Capillary: 148 mg/dL — ABNORMAL HIGH (ref 70–99)
Glucose-Capillary: 152 mg/dL — ABNORMAL HIGH (ref 70–99)

## 2019-09-15 LAB — PROTHROMBIN GENE MUTATION

## 2019-09-15 MED ORDER — POTASSIUM CHLORIDE CRYS ER 20 MEQ PO TBCR
20.0000 meq | EXTENDED_RELEASE_TABLET | Freq: Every day | ORAL | Status: DC
  Administered 2019-09-15 – 2019-10-03 (×19): 20 meq via ORAL
  Filled 2019-09-15 (×19): qty 1

## 2019-09-15 NOTE — Progress Notes (Signed)
[place] PHYSICAL MEDICINE & REHABILITATION PROGRESS NOTE   Subjective/Complaints: Pt states she had a pretty good night. Denies pain. Able to sleep. Working with therapy when I entered today  ROS: Patient denies fever, rash, sore throat, blurred vision, nausea, vomiting, diarrhea, cough, shortness of breath or chest pain, joint or back pain, headache, or mood change.    Objective:   CT HEAD WO CONTRAST  Result Date: 09/14/2019 CLINICAL DATA:  Neuro deficit with acute stroke suspected EXAM: CT HEAD WITHOUT CONTRAST TECHNIQUE: Contiguous axial images were obtained from the base of the skull through the vertex without intravenous contrast. COMPARISON:  09/11/2019 FINDINGS: Brain: Small patchy acute to subacute infarcts along the bilateral cerebral convexities with subarachnoid hemorrhage along the high and anterior right frontal sulcus, appearance stable from prior MRI. No evidence of acute infarct or interval hemorrhage. No hydrocephalus or worrisome swelling. Vascular: No hyperdense vessel or unexpected calcification. Skull: Normal. Negative for fracture or focal lesion. Sinuses/Orbits: No acute finding. IMPRESSION: Multifocal cortical infarction with small volume subarachnoid hemorrhage in the high right frontal lobe. No new or progressive finding when compared to brain MRI 09/11/2019. Electronically Signed   By: Monte Fantasia M.D.   On: 09/14/2019 11:07   ECHO TEE  Result Date: 09/14/2019   TRANSESOPHOGEAL ECHO REPORT   Patient Name:   [name] [name] Date of Exam: 09/14/2019 Medical Rec #:  [id number]       Height:       64.0 in Accession #:    [id number]      Weight:       200.6 lb Date of Birth:  [birth year]      BSA:          1.96 m Patient Age:    69 years        BP:           139/85 mmHg Patient Gender: F               HR:           72 bpm. Exam Location:  Inpatient  Procedure: Transesophageal Echo, Cardiac Doppler and Color Doppler Indications:     stroke  History:         Patient has prior  history of Echocardiogram examinations, most                  recent 08/19/2019.  Sonographer:     Johny Chess Referring Phys:  [id number] Woodfin Ganja THOMPSON Diagnosing Phys: Oswaldo Milian MD  PROCEDURE: Patients was monitored while under deep sedation. The transesophogeal probe was passed through the esophogus of the patient. The patient developed no complications during the procedure. IMPRESSIONS  1. Left ventricular ejection fraction, by visual estimation, is 60 to 65%. The left ventricle has normal function. There is mildly increased left ventricular hypertrophy.  2. Global right ventricle has normal systolic function.The right ventricular size is normal. No increase in right ventricular wall thickness.  3. The mitral valve is normal in structure. Trivial mitral valve regurgitation.  4. The tricuspid valve is normal in structure. Tricuspid valve regurgitation is trivial.  5. The aortic valve is tricuspid. Aortic valve regurgitation is mild.  6. The pulmonic valve was grossly normal. Pulmonic valve regurgitation is trivial.  7. Lipomatous hypertrophy of interatrial septum. Positive bubble study consistent wtih PFO. FINDINGS  Left Ventricle: Left ventricular ejection fraction, by visual estimation, is 60 to 65%. The left ventricle has normal function. The left ventricle has no regional wall  motion abnormalities. There is mildly increased left ventricular hypertrophy. Right Ventricle: The right ventricular size is normal. No increase in right ventricular wall thickness. Global RV systolic function is has normal systolic function. Left Atrium: Left atrial size was normal in size. Right Atrium: Right atrial size was normal in size Prominent Chiari network and Prominent Eustachian valve. Pericardium: There is no evidence of pericardial effusion. Mitral Valve: The mitral valve is normal in structure. Trivial mitral valve regurgitation. Tricuspid Valve: The tricuspid valve is normal in structure. Tricuspid valve  regurgitation is trivial. Aortic Valve: The aortic valve is tricuspid. Aortic valve regurgitation is mild. Pulmonic Valve: The pulmonic valve was grossly normal. Pulmonic valve regurgitation is trivial. Aorta: The aortic root is normal in size and structure. Shunts: Agitated saline contrast was given intravenously to evaluate for intracardiac shunting. Saline shunting was observed within 3-6 cardiac cycles suggestive of interatrial shunt. Evidence of atrial level shunting detected by color flow Doppler.  Oswaldo Milian MD Electronically signed by Oswaldo Milian MD Signature Date/Time: 09/14/2019/7:10:52 PM    Final    Recent Labs    09/14/19 0236 09/15/19 0537  WBC 9.2 9.5  HGB 11.9* 12.6  HCT 36.9 37.6  PLT 300 313   Recent Labs    09/14/19 0236 09/15/19 0537  NA 139 136  K 3.9 3.3*  CL 102 103  CO2 26 24  GLUCOSE 165* 180*  BUN 11 11  CREATININE 0.92 0.70  CALCIUM 8.7* 9.2    Intake/Output Summary (Last 24 hours) at 09/15/2019 0956 Last data filed at 09/15/2019 0730 Gross per 24 hour  Intake 120 ml  Output --  Net 120 ml     Physical Exam: Vital Signs Blood pressure (!) 159/61, pulse 82, temperature 97.8 F (36.6 C), resp. rate 17, height 5\' 4"  (1.626 m), weight 91 kg, SpO2 96 %. Constitutional: No distress . Vital signs reviewed. HEENT: EOMI, oral membranes moist Neck: supple Cardiovascular: RRR without murmur. No JVD    Respiratory: CTA Bilaterally without wheezes or rales. Normal effort    GI: BS +, non-tender, non-distended  Musculoskeletal:     Comments: no edema, normal ROM except for tight left heel cord Neurological: She is alert and O x 3. No focal CN abnl. Left inattention.  Motor: RUE/RLE: 5/5 proximal distal LUE: Shoulder abduction, elbow flexion/extension tr-1/5, handgrip 3-4/5 when able to engage.  LLE: Hip flexion tr-1/5, distally 0/5 Sensation diminished to touch bilateral plantar feet and LLE, minimal sensory loss LUE. No resting tone  Skin:  Skin is warm and dry.  Psychiatric: sl slow but pleasant    Assessment/Plan: 1. Functional deficits secondary to bicortical infarcts, Right frontal SAH which require 3+ hours per day of interdisciplinary therapy in a comprehensive inpatient rehab setting.  Physiatrist is providing close team supervision and 24 hour management of active medical problems listed below.  Physiatrist and rehab team continue to assess barriers to discharge/monitor patient progress toward functional and medical goals  Care Tool:  Bathing              Bathing assist       Upper Body Dressing/Undressing Upper body dressing   What is the patient wearing?: Hospital gown only    Upper body assist Assist Level: Minimal Assistance - Patient > 75%    Lower Body Dressing/Undressing Lower body dressing      What is the patient wearing?: Incontinence brief     Lower body assist Assist for lower body dressing: Total Assistance - Patient <  25%     Toileting Toileting    Toileting assist Assist for toileting: Moderate Assistance - Patient 50 - 74%     Transfers Chair/bed transfer  Transfers assist           Locomotion Ambulation   Ambulation assist              Walk 10 feet activity   Assist           Walk 50 feet activity   Assist           Walk 150 feet activity   Assist           Walk 10 feet on uneven surface  activity   Assist           Wheelchair     Assist               Wheelchair 50 feet with 2 turns activity    Assist            Wheelchair 150 feet activity     Assist          Blood pressure (!) 159/61, pulse 82, temperature 97.8 F (36.6 C), resp. rate 17, height 5\' 4"  (1.626 m), weight 91 kg, SpO2 96 %.  Medical Problem List and Plan: 1.  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 Patient with bilateral cerebral and right  occipital infarcts last week as well as pulmonary emboli with DVT.             -patient may shower             -ELOS/Goals: 17-20 days/Min A             -Patient is beginning CIR therapies today including PT, OT, and SLP   -PRAFO LLE 2.  Antithrombotics: -DVT/anticoagulation: Eliquis             -antiplatelet therapy: N/A 3. Pain Management: Lidoderm patch change as directed, hydrocodone as needed 4. Mood: Valium as needed for anxiety and depression             -antipsychotic agents: N/A 5. Neuropsych: This patient is capable of making decisions on her own behalf. 6. Skin/Wound Care: Routine skin checks 7. Fluids/Electrolytes/Nutrition: encourage PO  -I personally reviewed the patient's labs today.     -K+ 3.3---replete 8.  PFO.  Follow cardiology services.  TEE completed, suggesting PFO, await further Cards recs.. 9.  Hypertension.  Cardizem 120 mg daily, Lasix 20 mg daily, Cozaar 50 mg daily             SBP borderline to high---follow for trend  -avoid hypotension 10.  Hyperlipidemia: Continue Lipitor 11.  Diabetes mellitus with hyperglycemia.  Hemoglobin A1c 9.4.  SSI.  Patient on Glucophage 1000 mg twice daily prior to admission.  Resume as needed            2/4 cbg's under reasonable control at present 12.  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  Continue nebulizer treatments.  Check oxygen saturations every shift             Monitor with increased exertion 13.  CKD stage II.                2.4 BUN/Cr 11/0.7 14.  Restless leg syndrome.  Requip 2 mg 3 times daily    LOS: 1 days A FACE TO FACE EVALUATION WAS PERFORMED  Meredith Staggers 09/15/2019, 9:56 AM

## 2019-09-15 NOTE — Progress Notes (Signed)
Inpatient Rehabilitation  Patient information reviewed and entered into [facility] system by Ebonique Hallstrom M. Mong Neal, M.A., CCC/SLP, PPS Coordinator.  Information including medical coding, functional ability and quality indicators will be reviewed and updated through discharge.    

## 2019-09-15 NOTE — Progress Notes (Signed)
Orthopedic Tech Progress Note Patient Details:  [name] [name] Apr 04, 1951 [id number] Called in order to [facility] for a PRAFO BOOT Patient ID: [name] [name], female   DOB: [birth year], 69 y.o.   MRN: [id number]   [name] [name] 09/15/2019, 10:37 AM

## 2019-09-15 NOTE — Anesthesia Postprocedure Evaluation (Signed)
Anesthesia Post Note  Patient: NYEISHA GOODALL  Procedure(s) Performed: TRANSESOPHAGEAL ECHOCARDIOGRAM (TEE) (N/A ) BUBBLE STUDY     Patient location during evaluation: Endoscopy Anesthesia Type: MAC Level of consciousness: awake Pain management: pain level controlled Vital Signs Assessment: post-procedure vital signs reviewed and stable Respiratory status: spontaneous breathing Cardiovascular status: stable Postop Assessment: no apparent nausea or vomiting Anesthetic complications: no    Last Vitals:  Vitals:   09/14/19 1405 09/14/19 1509  BP: (!) 172/49 (!) 167/53  Pulse: 74 82  Resp: 15 20  Temp:  (!) 36.4 C  SpO2: 98% 97%    Last Pain:  Vitals:   09/14/19 1509  TempSrc: Oral  PainSc:    Pain Goal: Patients Stated Pain Goal: 1 (09/13/19 1237)                 Huston Foley

## 2019-09-15 NOTE — Evaluation (Signed)
Occupational Therapy Assessment and Plan  Patient Details  Name: [name] [name] MRN: [id number] Date of Birth: 09-04-1950  OT Diagnosis: apraxia, ataxia, cognitive deficits, hemiplegia affecting non-dominant side and muscle weakness (generalized) Rehab Potential: Rehab Potential (ACUTE ONLY): Good ELOS: 3-4 weeks   Today's Date: 09/15/2019 OT Individual Time: 1300-1400 OT Individual Time Calculation (min): 60 min     Problem List:  Patient Active Problem List   Diagnosis Date Noted  . Acute bilat watershed infarction [facility]) 09/14/2019  . CKD (chronic kidney disease), stage II   . Uncontrolled type 2 diabetes mellitus with hyperglycemia ([place])   . PFO (patent foramen ovale)   . Acute CVA (cerebrovascular accident) ([place]) 09/11/2019  . Subarachnoid hemorrhage ([place]) 09/11/2019  . DVT (deep venous thrombosis) ([place]) 09/11/2019  . Aneurysm, thoracic aortic ([place])   . Embolic stroke ([place])   . Pulmonary embolism ([place])   . Acute cerebrovascular accident (CVA) ([place]) 08/18/2019  . COPD (chronic obstructive pulmonary disease) ([place])   . COPD exacerbation ([place]) 08/12/2018  . Hyponatremia 08/12/2018  . Acute respiratory failure with hypoxia ([place]) 10/26/2015  . CAP (community acquired pneumonia) 10/26/2015  . Type 2 diabetes mellitus ([place]) 10/26/2015  . Overactive bladder 09/10/2015  . Diabetes mellitus type 2, controlled, without complications ([place])   . Depression   . Insomnia   . GERD (gastroesophageal reflux disease)   . Hypercholesteremia   . Hypertension   . Chronic kidney disease   . Overweight 10/29/2009  . GERD 06/20/2008  . CHEST PAIN 06/20/2008  . CHOLECYSTECTOMY, HX OF 06/20/2008  . HYPERLIPIDEMIA 12/11/2007  . TOBACCO ABUSE 12/11/2007  . DEPRESSION 12/11/2007  . Essential hypertension 12/11/2007  . Coronary atherosclerosis 12/11/2007  . DUODENITIS, WITH HEMORRHAGE 12/11/2007    Past Medical History:  Past Medical History:  Diagnosis Date  . Aneurysm, thoracic aortic  ([place])   . Anxiety   . Arthritis   . CAD (coronary artery disease)   . Chronic kidney disease    Renal insufficiency  . COPD (chronic obstructive pulmonary disease) ([place])   . Depression   . Diabetes mellitus   . Diverticulosis of colon (without mention of hemorrhage)   . Duodenitis without mention of hemorrhage   . Embolic stroke ([place])   . GERD (gastroesophageal reflux disease)   . Hemorrhoids   . Hypercholesteremia   . Hypertension   . Insomnia   . Pulmonary embolism ([place])   . Tinnitus   . Vertigo    Past Surgical History:  Past Surgical History:  Procedure Laterality Date  . ANGIOPLASTY    . BUBBLE STUDY  09/14/2019   Procedure: BUBBLE STUDY;  Surgeon: Donato Heinz, MD;  Location: [facility];  Service: Endoscopy;;  . CHOLECYSTECTOMY    . ROTATOR CUFF REPAIR Right 04/2012  . TEE WITHOUT CARDIOVERSION N/A 09/14/2019   Procedure: TRANSESOPHAGEAL ECHOCARDIOGRAM (TEE);  Surgeon: Donato Heinz, MD;  Location: [facility] ENDOSCOPY;  Service: Endoscopy;  Laterality: N/A;  . TUBAL LIGATION      Assessment & Plan Clinical Impression: Patient is a 69 y.o. year old female with history of CAD, CKD stage II, COPD with chronic hypoxemic respiratory failure on 3 L oxygen at home at night, type 2 diabetes mellitus, recent shoulder fracture nonsurgical managed with a sling, hypertension, hyperlipidemia, thoracic aortic aneurysm, patient quit smoking 12 months ago .  Per chart review patient lives alone.  1 level home with ramped entrance.  Used a walker occasionally prior to admission.  Her son did help with some  basic ADLs.  Patient had been admitted 08/18/2019 to 08/20/2019 for acute CVA with MRI showing watershed infarcts in bilateral hemispheres.  CT angiogram head and neck revealed 50% stenosis of the proximal right internal carotid artery due to bulky atherosclerotic calcification.  Carotid Dopplers with no ICA stenosis.  Echocardiogram normal EF no acute findings.  Patient was  discharged home on aspirin and Plavix x3 months then Plavix alone.  On 08/23/2019 patient post hospital evaluation by PCP noted to have shortness of breath CT angio of chest showed acute bilateral segmental subsegmental pulmonary bleeding.  Subsequent DVT scan showed right lower extremity DVT.  Patient was started on Xarelto replacing aspirin and Plavix.  08/30/2019 bubble study done by outpatient neurology showed a small PFO but it was not felt large enough to allow for emboli the past from right to left side of heart.  Patient presented to the ED 09/07/2019 for left-sided weakness CT of head as well as MRI of the brain showed evolution of old strokes with no new infarcts and hence patient was subsequently discharged to home.  Presented 09/11/2019 with increasing left-sided weakness as well as hypotension.  Patient did receive 500 mL fluid bolus.  MRI and imaging revealed continued interval evolution of previously seen subacute infarcts, with multiple new scattered acute ischemic watershed type infarctions involving the bilateral cerebral hemispheres, right greater than left.  Associated petechial hemorrhage about a few of these infarcts without frank hemorrhagic transformation.  Small volume subarachnoid hemorrhage layering along the cortical sulcus within the right frontal lobe, new from previous tracing.  Patient transferred to CIR on 09/14/2019 .    Patient currently requires max/total with basic self-care skills secondary to muscle weakness, impaired timing and sequencing, abnormal tone, unbalanced muscle activation, motor apraxia, ataxia, decreased coordination and decreased motor planning, decreased attention to left, left side neglect and decreased motor planning, decreased initiation, decreased attention, decreased awareness, decreased problem solving, decreased safety awareness, decreased memory and delayed processing and decreased sitting balance, decreased standing balance, decreased postural control,  hemiplegia and decreased balance strategies.  Prior to hospitalization, patient could complete BADL with modified independent .  Patient will benefit from skilled intervention to increase independence with basic self-care skills prior to discharge home with care partner.  Anticipate patient will require 24 hour supervision and minimal physical assistance and follow up home health.  OT - End of Session Endurance Deficit: Yes Endurance Deficit Description: rest breaks within BADL tasks OT Assessment Rehab Potential (ACUTE ONLY): Good OT Patient demonstrates impairments in the following area(s): Balance;Cognition;Behavior;Endurance;Motor;Perception;Safety OT Basic ADL's Functional Problem(s): Eating;Bathing;Grooming;Dressing;Toileting OT Transfers Functional Problem(s): Toilet;Tub/Shower OT Additional Impairment(s): Fuctional Use of Upper Extremity OT Plan OT Intensity: Minimum of 1-2 x/day, 45 to 90 minutes OT Frequency: 5 out of 7 days OT Duration/Estimated Length of Stay: 3-4 weeks OT Treatment/Interventions: Balance/vestibular training;Community reintegration;Cognitive remediation/compensation;Discharge planning;DME/adaptive equipment instruction;Functional electrical stimulation;Functional mobility training;Neuromuscular re-education;Pain management;Patient/family education;Self Care/advanced ADL retraining;Splinting/orthotics;Therapeutic Activities;Therapeutic Exercise;UE/LE Strength taining/ROM;UE/LE Coordination activities;Wheelchair propulsion/positioning;Visual/perceptual remediation/compensation OT Self Feeding Anticipated Outcome(s): Supervision OT Basic Self-Care Anticipated Outcome(s): Min A OT Toileting Anticipated Outcome(s): Min A OT Bathroom Transfers Anticipated Outcome(s): Min A OT Recommendation Recommendations for Other Services: Neuropsych consult Patient destination: Home Follow Up Recommendations: Home health OT Equipment Recommended: To be determined  Skilled  Therapeutic Intervention OT eval completed addressing rehab process, OT purpose, POC, ELOS, and goals. Pt greeted semi-reclined in bed eating lunch. Pt had only taken a few bites of the sweet potatoes on R side of plate and did not touch  any of the food on L side. Pt able to locate food past midline on L side with moderate verbal and visual cues. Pt then agreeable to get dressed but declined to wash up. Pt came to sitting EOB with max A 2/2 L hemiplegia and motor planning deficits. Pt could maintain static sitting balance with supervision/min A, but needed min/mod A for sitting balance with increased task demand 2/2 posterior LOB. Pt with difficulty motor planning how to don clothes and needed max A for LB and UB dressing. Max A sit<>stand at EOB without AD and total A to pull pants over hips. Pt took seated rest break, then stood again with mod A, but needed max/total A to then pivot over to wc on stronger R side 2/2 apraxia and difficulty coordinating pivot. Pt brought to the sink for grooming tasks with max A to locate items on L side of sink. Verbal cues for task initaition and termination. OT obtained wc cushion and pt needed max A to sit<>stand at the sink while OT placed wc cushion. Pt left seated in wc with alarm belt on, call bell in reach, and needs met.   OT Evaluation Precautions/Restrictions  Precautions Precautions: Fall Precaution Comments: recent L humeral fx, no orders Restrictions Weight Bearing Restrictions: No Pain  denies pain Home Living/Prior Functioning Home Living Family/patient expects to be discharged to:: Private residence Living Arrangements: Alone Available Help at Discharge: Family, Available 24 hours/day Type of Home: House Home Access: Ramped entrance Home Layout: One level Bathroom Shower/Tub: Walk-in shower Additional Comments: 44 y/o granddaughter helps during the day and son and daughter in law help at night and weekends per chart review and discussion w/ pt.  Pt has a shower seat, a BSC, RW, and wc  Lives With: Alone Prior Function Level of Independence: Independent with basic ADLs, Needs assistance with homemaking, Independent with homemaking with ambulation  Able to Take Stairs?: Yes Driving: Yes Vocation: Retired Comments: Pt drives and does most of the cooking and cleaning ADL ADL Eating: Minimal assistance Grooming: Minimal assistance Upper Body Bathing: Not assessed Lower Body Bathing: Not assessed Upper Body Dressing: Maximal assistance Lower Body Dressing: Maximal assistance, Dependent Toileting: Maximal assistance Toilet Transfer: Dependent, Maximal assistance Vision Baseline Vision/History: Wears glasses Wears Glasses: At all times Perception  Perception: Impaired Inattention/Neglect: Does not attend to left side of body;Does not attend to left visual field Praxis Praxis: Impaired Praxis Impairment Details: Ideomotor;Motor planning;Initiation Praxis-Other Comments: L side>R side Cognition Overall Cognitive Status: Impaired/Different from baseline Arousal/Alertness: Awake/alert Orientation Level: Person;Place;Situation Person: Oriented Place: Oriented Situation: Oriented Year: 2021 Month: February Day of Week: Correct Memory: Impaired Memory Impairment: Storage deficit;Retrieval deficit;Decreased short term memory Decreased Short Term Memory: Verbal basic;Functional complex Immediate Memory Recall: Sock;Bed;Blue Memory Recall Sock: Not able to recall Memory Recall Blue: Without Cue Memory Recall Bed: Not able to recall Awareness: Impaired Problem Solving: Impaired Problem Solving Impairment: Verbal basic;Functional basic Executive Function: Organizing;Self Correcting Organizing: Impaired Organizing Impairment: Verbal basic;Functional basic Self Correcting: Impaired Self Correcting Impairment: Functional basic Safety/Judgment: Impaired Sensation Coordination Gross Motor Movements are Fluid and Coordinated:  No Fine Motor Movements are Fluid and Coordinated: No Coordination and Movement Description: L hemiplegia Motor  Motor Motor: Hemiplegia;Motor apraxia Motor - Skilled Clinical Observations: L hemi, motor apraxia L side > R side Mobility  Bed Mobility Supine to Sit: Maximal Assistance - Patient - Patient 25-49% Sit to Supine: Maximal Assistance - Patient 25-49% Transfers Sit to Stand: Moderate Assistance - Patient 50-74% Stand to Sit:  Moderate Assistance - Patient 50-74%  Balance Static Sitting Balance Static Sitting - Level of Assistance: 5: Stand by assistance;4: Min assist Dynamic Sitting Balance Dynamic Sitting - Level of Assistance: 4: Min assist Static Standing Balance Static Standing - Level of Assistance: 3: Mod assist Dynamic Standing Balance Dynamic Standing - Level of Assistance: 2: Max assist Extremity/Trunk Assessment RUE Assessment RUE Assessment: Within Functional Limits LUE Assessment LUE Assessment: Exceptions to [facility] General Strength Comments: pt with more distal movement of hand, can grasp/release, trace elbow flexion LUE Body System: Neuro Brunstrum levels for arm and hand: Arm;Hand Brunstrum level for arm: Stage II Synergy is developing Brunstrum level for hand: Stage IV Movements deviating from synergies  Refer to Care Plan for Long Term Goals  Recommendations for other services: Neuropsych   Discharge Criteria: Patient will be discharged from OT if patient refuses treatment 3 consecutive times without medical reason, if treatment goals not met, if there is a change in medical status, if patient makes no progress towards goals or if patient is discharged from hospital.  The above assessment, treatment plan, treatment alternatives and goals were discussed and mutually agreed upon: by patient  Valma Cava 09/15/2019, 3:28 PM

## 2019-09-15 NOTE — Care Management Important Message (Signed)
Important Message  Patient Details  Name: [name] [name] MRN: [id number] Date of Birth: 1950/10/21   Medicare Important Message Given:  Yes     Orbie Pyo 09/15/2019, 8:34 AM

## 2019-09-15 NOTE — Progress Notes (Signed)
Structural Heart Note:  The patient was seen yesterday evening after her transesophageal echocardiogram.  I was able to review her TEE study images with Dr. Gardiner Rhyme who performed the procedure.  The TEE confirms the presence of a moderate sized PFO.  There is marked lipomatous hypertrophy of the interatrial septum.  There is no evidence of atrial septal aneurysm.  While it is technically feasible to perform transcatheter closure with this PFO anatomy, it is important to note that the TEE also demonstrated significant aortic athero.  Considering her age, presence of fairly extensive diffuse atherosclerotic disease, and other vascular risk factors, I do not feel that a PFO closure would be particularly impactful in reducing her risk of the recurrent stroke in the setting of chronic anticoagulation.  This could be revisited if she has problems tolerating oral anticoagulation or certainly if she has another recurrent event.  All of the patient's questions were answered.  I would be happy to see her in the future if any further problems arise.  Sherren Mocha 09/15/2019 1:12 PM

## 2019-09-15 NOTE — Evaluation (Signed)
Physical Therapy Assessment and Plan  Patient Details  Name: [name] [name] MRN: [id number] Date of Birth: [birth year]  PT Diagnosis: Cognitive deficits, Difficulty walking, Hemiplegia non-dominant, Impaired cognition and Muscle weakness Rehab Potential: Fair ELOS: 3-4 weeks   Today's Date: 09/15/2019 PT Individual Time: 0925-1005 PT Individual Time Calculation (min): 40 min  and Today's Date: 09/15/2019 PT Missed Time: 20 Minutes Missed Time Reason: Patient fatigue   Problem List:  Patient Active Problem List   Diagnosis Date Noted  . Acute bilat watershed infarction [facility]) 09/14/2019  . CKD (chronic kidney disease), stage II   . Uncontrolled type 2 diabetes mellitus with hyperglycemia ([place])   . PFO (patent foramen ovale)   . Acute CVA (cerebrovascular accident) ([place]) 09/11/2019  . Subarachnoid hemorrhage ([place]) 09/11/2019  . DVT (deep venous thrombosis) ([place]) 09/11/2019  . Aneurysm, thoracic aortic ([place])   . Embolic stroke ([place])   . Pulmonary embolism ([place])   . Acute cerebrovascular accident (CVA) ([place]) 08/18/2019  . COPD (chronic obstructive pulmonary disease) ([place])   . COPD exacerbation ([place]) 08/12/2018  . Hyponatremia 08/12/2018  . Acute respiratory failure with hypoxia ([place]) 10/26/2015  . CAP (community acquired pneumonia) 10/26/2015  . Type 2 diabetes mellitus ([place]) 10/26/2015  . Overactive bladder 09/10/2015  . Diabetes mellitus type 2, controlled, without complications ([place])   . Depression   . Insomnia   . GERD (gastroesophageal reflux disease)   . Hypercholesteremia   . Hypertension   . Chronic kidney disease   . Overweight 10/29/2009  . GERD 06/20/2008  . CHEST PAIN 06/20/2008  . CHOLECYSTECTOMY, HX OF 06/20/2008  . HYPERLIPIDEMIA 12/11/2007  . TOBACCO ABUSE 12/11/2007  . DEPRESSION 12/11/2007  . Essential hypertension 12/11/2007  . Coronary atherosclerosis 12/11/2007  . DUODENITIS, WITH HEMORRHAGE 12/11/2007    Past Medical History:  Past Medical  History:  Diagnosis Date  . Aneurysm, thoracic aortic ([place])   . Anxiety   . Arthritis   . CAD (coronary artery disease)   . Chronic kidney disease    Renal insufficiency  . COPD (chronic obstructive pulmonary disease) ([place])   . Depression   . Diabetes mellitus   . Diverticulosis of colon (without mention of hemorrhage)   . Duodenitis without mention of hemorrhage   . Embolic stroke ([place])   . GERD (gastroesophageal reflux disease)   . Hemorrhoids   . Hypercholesteremia   . Hypertension   . Insomnia   . Pulmonary embolism ([place])   . Tinnitus   . Vertigo    Past Surgical History:  Past Surgical History:  Procedure Laterality Date  . ANGIOPLASTY    . BUBBLE STUDY  09/14/2019   Procedure: BUBBLE STUDY;  Surgeon: Donato Heinz, MD;  Location: [facility];  Service: Endoscopy;;  . CHOLECYSTECTOMY    . ROTATOR CUFF REPAIR Right 04/2012  . TEE WITHOUT CARDIOVERSION N/A 09/14/2019   Procedure: TRANSESOPHAGEAL ECHOCARDIOGRAM (TEE);  Surgeon: Donato Heinz, MD;  Location: [facility] ENDOSCOPY;  Service: Endoscopy;  Laterality: N/A;  . TUBAL LIGATION      Assessment & Plan Clinical Impression: Patient is a 69 year old right-handed female with history of CAD with history of angioplasty, CKD stage II, COPD with chronic hypoxemic respiratory failure maintained on 3 L of oxygen at home at night, type 2 diabetes mellitus, recent shoulder fracture nonsurgical managed with a sling, hypertension, hyperlipidemia, thoracic aortic aneurysm, patient quit smoking 12 months ago.  History taken from chart review and patient. Patient lives alone.  1 level home with a ramped  entrance.  Used a walker occasionally prior to admission.  Her son did help some basic ADLs.  Patient had been admitted 08/18/2019 to 08/20/2019 for acute CVA with MRI showing watershed infarction in bilateral hemispheres.  CT angiogram head and neck revealed 50% stenosis of the proximal right internal carotid artery due to bulky  atherosclerotic calcification.  Carotid Dopplers with no ICA stenosis.  TTE with normal EF.  Patient was discharged home on aspirin and Plavix x3 months then Plavix alone.  On 08/23/2019 hospital evaluation by PCP noted to have shortness of breath where CT angiogram of the chest showed acute bilateral segmental subsegmental pulmonary emboli.  Subsequent DVT scan showed right lower extremity DVT involving the right peroneal veins.  Patient was started on Xarelto replacing aspirin and Plavix.  On 08/30/2019 bubble study done by outpatient neurology showed a small PFO but it was not felt large enough to allow for emboli to pass from right to left side of heart.  Patient presented to the ED on 09/07/2019 due to left hemi-paresis. CT of the head as well as MRI of the brain showed evolution of old stroke without new infarcts. As a result, patient was subsequently discharged to home.  She presented on 09/11/2019 with increasing left-sided weakness as well as hypotension.  She did receive 500 mL fluid bolus.  MRI and imaging revealed continued interval evolution of previously seen subacute infarcts with multiple new scattered acute ischemic watershed type infarctions involving the bilateral cerebral hemispheres, right greater than left.  Associated petechial hemorrhage about a few of these infarcts without frank hemorrhagic transformation.  Small volume subarachnoid hemorrhage layering along the cortical sulcus within the right frontal lobe, new from previous tracing.  Neurology consulted hypercoagulable work-up.  Patient Xarelto has been changed to Eliquis given new infarctions and PE.  A follow-up stat cranial CT scan was completed this morning for increased left side weakness. Personally reviewed, no acute intracranial abnormalities.  However, given current deficits, suspect futher extension. Discussed with Neurology weakness, PFO, and plans for possible intervention.  Cardiology service follow-up in regards to recent  findings of PFO. TEE completed, positive for PFO. Patient transferred to CIR on 09/14/2019 .   Patient currently requires total with mobility secondary to muscle weakness, decreased cardiorespiratoy endurance, impaired timing and sequencing, unbalanced muscle activation, motor apraxia, decreased coordination and decreased motor planning, decreased visual motor skills, decreased attention to left, decreased initiation, decreased attention, decreased awareness, decreased problem solving, decreased safety awareness, decreased memory and delayed processing and decreased standing balance, decreased postural control, hemiplegia and decreased balance strategies.  Prior to hospitalization, patient was modified independent  with mobility and lived with Alone in a House home.  Home access is  Ramped entrance.  Patient will benefit from skilled PT intervention to maximize safe functional mobility, minimize fall risk and decrease caregiver burden for planned discharge home with 24 hour assist if someone over age of 44 can be the primary caregiver. If the family is not able to arrange this, would recommend SNF placement. Anticipate patient will benefit from follow up [place] (or SNF - see note abote) at discharge.  PT - End of Session Activity Tolerance: Tolerates < 10 min activity, no significant change in vital signs Endurance Deficit: Yes Endurance Deficit Description: decreased, moderate increase in work of breathing even w/ sitting OOB, spO2 >95% and HE in 90s bpm on RA PT Assessment Rehab Potential (ACUTE/IP ONLY): Fair PT Barriers to Discharge: Decreased caregiver support;Medical stability PT Barriers to Discharge Comments:  Pt w/ no caregiver during day (would not recommend pt's 55 yo daughter provide care during day when son and daughter-in-law work) PT Patient demonstrates impairments in the following area(s): Balance;Endurance;Motor;Behavior;Perception;Safety PT Transfers Functional Problem(s): Bed  Mobility;Floor;Bed to Chair;Car;Furniture PT Locomotion Functional Problem(s): Ambulation;Wheelchair Mobility;Stairs PT Plan PT Intensity: Minimum of 1-2 x/day ,45 to 90 minutes PT Frequency: 5 out of 7 days PT Duration Estimated Length of Stay: 3-4 weeks PT Treatment/Interventions: Ambulation/gait training;Cognitive remediation/compensation;Discharge planning;DME/adaptive equipment instruction;Functional mobility training;Pain management;Psychosocial support;Splinting/orthotics;Therapeutic Activities;UE/LE Strength taining/ROM;Visual/perceptual remediation/compensation;Wheelchair propulsion/positioning;UE/LE Coordination activities;Therapeutic Exercise;Stair training;Skin care/wound management;Patient/family education;Neuromuscular re-education;Functional electrical stimulation;Disease management/prevention;Community reintegration;Balance/vestibular training PT Transfers Anticipated Outcome(s): min assist PT Locomotion Anticipated Outcome(s): mod assist short distance gait w/ LRAD, otherwise primarily w/c level PT Recommendation Follow Up Recommendations: Home health PT(SNF if family cannot provide 24/7 assist from adult over age of 105) Patient destination: Home Equipment Recommended: To be determined Equipment Details: has RW and w/c  Skilled Therapeutic Intervention  Pt received in supine, agreeable to therapy and denies pain. Bed mobility w/ max assist. Static sitting balance w/ close superviison as pt briefly answered phone, needed verbal cues on how to answer phone as well. Max-total assist stand pivot to w/c, pt able to stand w/ min-mod assist however unable to follow simple/1-step tactile and verbal cues to initiate pivot steps. Total assist then needed to pivot bottom onto chair. Total assist w/c transport to/from therapy gym. Pt w/ slow processing to answer therapist's questions and to follow simple motor commands. Sit>stand at rail w/ min assist and attempted gait. Pt w/ fair L knee  control, however unable to motor plan taking step w/ either extremity despite max facilitation from therapist. Pt also w/ moderate increase in work of breathing, pt endorsed feeling slightly SOB. spO2 >95% and HR in 90s bpm on RA, verbal cues for breathing strategies w/ return to baseline breathing within a few minutes. Performed kinetron in seated @ 90 cm/sec, needed verbal and tactile cues to attend to task and for adequate LLE muscle activation. Performed x4 min for NMR and reciprocal movement pattern. Pt c/o increased fatigue, difficulty attending to task and feeling very tired. Returned to room and max assist stand pivot to EOB, again needing max assist to swing hips onto bed.   Instructed pt in results of PT evaluation as detailed below, PT POC, rehab potential, rehab goals, and discharge recommendations. Pt in agreement that her granddaughter would likely not be an appropriate caregiver for her during the day. Ended session in supine, all needs in reach.  PT Evaluation Precautions/Restrictions Precautions Precautions: Fall Precaution Comments: recent L humeral fx, no orders Restrictions Weight Bearing Restrictions: No Pain Pain Assessment Pain Scale: Faces Pain Score: 0-No pain Faces Pain Scale: Hurts a little bit Pain Type: Acute pain Pain Location: Back Pain Descriptors / Indicators: Aching Pain Onset: Gradual Pain Intervention(s): RN made aware Multiple Pain Sites: No Home Living/Prior Functioning Home Living Available Help at Discharge: Family;Available 24 hours/day Type of Home: House Home Access: Ramped entrance Home Layout: One level Additional Comments: 27 y/o granddaughter helps during the day and son and daughter in law help at night and weekends per chart review and discussion w/ pt  Lives With: Alone Prior Function Level of Independence: Independent with homemaking with ambulation;Independent with gait;Independent with transfers;Needs assistance with ADLs;Requires  assistive device for independence;Needs assistance with homemaking(mod/i w/ RW occasionally, needed some help w/ meals/chores and ADLs)  Able to Take Stairs?: Yes Driving: Yes Vocation: Retired Vision/Perception  Vision - Risk analyst:  Within Functional Limits Ocular Range of Motion: Within Functional Limits Alignment/Gaze Preference: Within Defined Limits Tracking/Visual Pursuits: Decreased smoothness of eye movement to LEFT superior field;Decreased smoothness of eye movement to LEFT inferior field;Other (comment);Requires cues, head turns, or add eye shifts to track(Pt unable to track to far L side) Perception Perception: Impaired(decreased awareness of L side of body) Praxis Praxis: Impaired Praxis Impairment Details: Ideomotor;Motor planning;Initiation Praxis-Other Comments: L side>R side  Cognition Overall Cognitive Status: Impaired/Different from baseline Arousal/Alertness: Awake/alert Orientation Level: Oriented X4 Memory: Impaired Memory Impairment: Storage deficit;Retrieval deficit;Decreased short term memory Decreased Short Term Memory: Verbal basic;Functional complex Problem Solving: Impaired Problem Solving Impairment: Verbal basic;Functional basic Executive Function: Organizing;Self Correcting Organizing: Impaired Organizing Impairment: Verbal basic;Functional basic Self Correcting: Impaired Self Correcting Impairment: Functional basic Safety/Judgment: Impaired(due to decreased awareness) Sensation Sensation Light Touch: Appears Intact(difficult to formally assess 2/2 cognition, pt moves all extremities in response to touch) Coordination Gross Motor Movements are Fluid and Coordinated: No Coordination and Movement Description: dense L hemi Motor  Motor Motor: Hemiplegia;Motor apraxia Motor - Skilled Clinical Observations: L hemi, motor apraxia L side > R side  Mobility Bed Mobility Bed Mobility: Rolling Left;Supine to Sit;Rolling Right;Sit to  Supine Rolling Right: Maximal Assistance - Patient 25-49% Rolling Left: Maximal Assistance - Patient 25-49% Supine to Sit: Maximal Assistance - Patient - Patient 25-49% Sit to Supine: Maximal Assistance - Patient 25-49% Transfers Transfers: Stand to Sit;Sit to Stand;Stand Pivot Transfers Sit to Stand: Moderate Assistance - Patient 50-74% Stand to Sit: Moderate Assistance - Patient 50-74% Stand Pivot Transfers: Total Assistance - Patient < 25% Stand Pivot Transfer Details: Manual facilitation for weight shifting;Manual facilitation for placement;Manual facilitation for weight bearing;Verbal cues for technique;Verbal cues for sequencing;Tactile cues for initiation;Tactile cues for weight shifting;Tactile cues for posture Transfer (Assistive device): None Locomotion  Gait Ambulation: No Gait Gait: No Stairs / Additional Locomotion Stairs: No Wheelchair Mobility Wheelchair Mobility: No  Trunk/Postural Assessment  Cervical Assessment Cervical Assessment: Exceptions to WFL(forward head) Thoracic Assessment Thoracic Assessment: Exceptions to WFL(rounded shoulders) Lumbar Assessment Lumbar Assessment: Exceptions to WFL(posterior pelvic tilt) Postural Control Postural Control: Deficits on evaluation(delayed/insufficient)  Balance Balance Balance Assessed: Yes Static Sitting Balance Static Sitting - Level of Assistance: 5: Stand by assistance Dynamic Sitting Balance Dynamic Sitting - Level of Assistance: 4: Min assist Static Standing Balance Static Standing - Level of Assistance: 4: Min assist Dynamic Standing Balance Dynamic Standing - Level of Assistance: 2: Max assist Extremity Assessment  RLE Assessment RLE Assessment: Exceptions to [facility] Passive Range of Motion (PROM) Comments: [facility] General Strength Comments: Hip strength 2-3/5, knee and ankle 5/5, could be related to motor apraxia, pt needed to lift R knee w/ UEs to achieve hip flexion LLE Assessment LLE Assessment: Exceptions  to [facility] Passive Range of Motion (PROM) Comments: [facility] General Strength Comments: Globally 2/5, could also be related to motor apraxia, therapist asking pt to lift LLE and pt w/ lift RLE, unable to correct even w/ verbal and tactile cues    Refer to Care Plan for Long Term Goals  Recommendations for other services: None   Discharge Criteria: Patient will be discharged from PT if patient refuses treatment 3 consecutive times without medical reason, if treatment goals not met, if there is a change in medical status, if patient makes no progress towards goals or if patient is discharged from hospital.  The above assessment, treatment plan, treatment alternatives and goals were discussed and mutually agreed upon: No family available/patient unable  Ervan Heber K Elzena Muston 09/15/2019, 10:48  AM  

## 2019-09-15 NOTE — Evaluation (Addendum)
Speech Language Pathology Assessment and Plan  Patient Details  Name: [name] [name] MRN: [id number] Date of Birth: 10/29/1950  SLP Diagnosis: Cognitive Impairments  Rehab Potential: Good ELOS: 3-4 weeks    Today's Date: 09/15/2019 SLP Individual Time: 3419-3790 SLP Individual Time Calculation (min): 55 min   Problem List:  Patient Active Problem List   Diagnosis Date Noted  . Acute bilat watershed infarction [facility]) 09/14/2019  . CKD (chronic kidney disease), stage II   . Uncontrolled type 2 diabetes mellitus with hyperglycemia ([place])   . PFO (patent foramen ovale)   . Acute CVA (cerebrovascular accident) ([place]) 09/11/2019  . Subarachnoid hemorrhage ([place]) 09/11/2019  . DVT (deep venous thrombosis) ([place]) 09/11/2019  . Aneurysm, thoracic aortic ([place])   . Embolic stroke ([place])   . Pulmonary embolism ([place])   . Acute cerebrovascular accident (CVA) ([place]) 08/18/2019  . COPD (chronic obstructive pulmonary disease) ([place])   . COPD exacerbation ([place]) 08/12/2018  . Hyponatremia 08/12/2018  . Acute respiratory failure with hypoxia ([place]) 10/26/2015  . CAP (community acquired pneumonia) 10/26/2015  . Type 2 diabetes mellitus ([place]) 10/26/2015  . Overactive bladder 09/10/2015  . Diabetes mellitus type 2, controlled, without complications ([place])   . Depression   . Insomnia   . GERD (gastroesophageal reflux disease)   . Hypercholesteremia   . Hypertension   . Chronic kidney disease   . Overweight 10/29/2009  . GERD 06/20/2008  . CHEST PAIN 06/20/2008  . CHOLECYSTECTOMY, HX OF 06/20/2008  . HYPERLIPIDEMIA 12/11/2007  . TOBACCO ABUSE 12/11/2007  . DEPRESSION 12/11/2007  . Essential hypertension 12/11/2007  . Coronary atherosclerosis 12/11/2007  . DUODENITIS, WITH HEMORRHAGE 12/11/2007   Past Medical History:  Past Medical History:  Diagnosis Date  . Aneurysm, thoracic aortic ([place])   . Anxiety   . Arthritis   . CAD (coronary artery disease)   . Chronic kidney disease    Renal  insufficiency  . COPD (chronic obstructive pulmonary disease) ([place])   . Depression   . Diabetes mellitus   . Diverticulosis of colon (without mention of hemorrhage)   . Duodenitis without mention of hemorrhage   . Embolic stroke ([place])   . GERD (gastroesophageal reflux disease)   . Hemorrhoids   . Hypercholesteremia   . Hypertension   . Insomnia   . Pulmonary embolism ([place])   . Tinnitus   . Vertigo    Past Surgical History:  Past Surgical History:  Procedure Laterality Date  . ANGIOPLASTY    . CHOLECYSTECTOMY    . ROTATOR CUFF REPAIR Right 04/2012  . TUBAL LIGATION      Assessment / Plan / Recommendation Clinical Impression   HPI: [name] [name]. Templer is a 69 year old right-handed female with history of CAD with history of angioplasty, CKD stage II, COPD with chronic hypoxemic respiratory failure maintained on 3 L of oxygen at home at night, type 2 diabetes mellitus, recent shoulder fracture nonsurgical managed with a sling, hypertension, hyperlipidemia, thoracic aortic aneurysm, patient quit smoking 12 months ago.  History taken from chart review and patient. Patient lives alone.  1 level home with a ramped entrance.  Used a walker occasionally prior to admission.  Her son did help some basic ADLs.  Patient had been admitted 08/18/2019 to 08/20/2019 for acute CVA with MRI showing watershed infarction in bilateral hemispheres.  CT angiogram head and neck revealed 50% stenosis of the proximal right internal carotid artery due to bulky atherosclerotic calcification.  Carotid Dopplers with no ICA stenosis.  TTE with normal  EF.  Patient was discharged home on aspirin and Plavix x3 months then Plavix alone.  On 08/23/2019 hospital evaluation by PCP noted to have shortness of breath where CT angiogram of the chest showed acute bilateral segmental subsegmental pulmonary emboli.  Subsequent DVT scan showed right lower extremity DVT involving the right peroneal veins.  Patient was started on Xarelto replacing  aspirin and Plavix.  On 08/30/2019 bubble study done by outpatient neurology showed a small PFO but it was not felt large enough to allow for emboli to pass from right to left side of heart.  Patient presented to the ED on 09/07/2019 due to left hemi-paresis. CT of the head as well as MRI of the brain showed evolution of old stroke without new infarcts. As a result, patient was subsequently discharged to home.  She presented on 09/11/2019 with increasing left-sided weakness as well as hypotension.  She did receive 500 mL fluid bolus.  MRI and imaging revealed continued interval evolution of previously seen subacute infarcts with multiple new scattered acute ischemic watershed type infarctions involving the bilateral cerebral hemispheres, right greater than left.  Associated petechial hemorrhage about a few of these infarcts without frank hemorrhagic transformation.  Small volume subarachnoid hemorrhage layering along the cortical sulcus within the right frontal lobe, new from previous tracing.  Neurology consulted hypercoagulable work-up.  Patient Xarelto has been changed to Eliquis given new infarctions and PE.  A follow-up stat cranial CT scan was completed this morning for increased left side weakness. Personally reviewed, no acute intracranial abnormalities.  However, given current deficits, suspect futher extension. Discussed with Neurology weakness, PFO, and plans for possible intervention.  Cardiology service follow-up in regards to recent findings of PFO. TEE completed, positive for PFO. Pt admitted to CIR 09/14/19 and SLP evaluation was completed 09/15/19 with results as follows:  Pt presents with moderate cognitive impairments impacting her sustained attention, emergent and anticipatory awareness, basic problem solving, and short term memory. Decreased awareness of the impact of her current physical and cognitive deficits presents a concern for her safety. She was also mostly unaware of errors throughout formal  and informal testing measures and had decreased insight into what her needs would be upon returning home. Pt scored a 15/30 on the [facility] version 7.1, with decreased attention as well as working and short term memory believed to heavily influence her performance on all subtests. In functional conversation, pt OX4 and verbally recalled new medical issues and timeline of hospitalizations with 100% accuracy, however she demonstrated decreased recall of familiar medication names and functions (recalled ~1/3 home medicines, per those listed in chart).  Recommend Pt receive skilled ST to address above listed cognitive impairments in order to maximize safety, functional independence, and reduce burden of care upon discharge.   Skilled Therapeutic Interventions          Cognitive-linguistic evaluation was administered and results were reviewed with pt (see above for details).   SLP Assessment  Patient will need skilled [facility] during CIR admission    Recommendations  Patient destination: Home Follow up Recommendations: Home Health SLP;24 hour supervision/assistance Equipment Recommended: None recommended by SLP    SLP Frequency 3 to 5 out of 7 days   SLP Duration  SLP Intensity  SLP Treatment/Interventions 3-4 weeks  Minumum of 1-2 x/day, 30 to 90 minutes  Cognitive remediation/compensation;Cueing hierarchy;Functional tasks;Internal/external aids;Patient/family education;Therapeutic Activities    Pain Pain Assessment Pain Scale: Faces Pain Score: 0-No pain Faces Pain Scale: Hurts a little bit  Pain Type: Acute pain Pain Location: Back Pain Descriptors / Indicators: Aching Pain Onset: Gradual Pain Intervention(s): RN made aware Multiple Pain Sites: No  Prior Functioning Type of Home: House  Lives With: Alone Available Help at Discharge: Family;Available 24 hours/day Vocation: Retired  Programmer, systems Overall Cognitive Status: Impaired/Different from  baseline Arousal/Alertness: Awake/alert Orientation Level: Oriented X4 Memory: Impaired Memory Impairment: Storage deficit;Retrieval deficit;Decreased short term memory Decreased Short Term Memory: Verbal basic;Functional complex Problem Solving: Impaired Problem Solving Impairment: Verbal basic;Functional basic Executive Function: Organizing;Self Correcting Organizing: Impaired Organizing Impairment: Verbal basic;Functional basic Self Correcting: Impaired Self Correcting Impairment: Functional basic Safety/Judgment: Impaired(due to decreased awareness)  Comprehension Auditory Comprehension Overall Auditory Comprehension: Appears within functional limits for tasks assessed Yes/No Questions: Within Functional Limits Commands: Within Functional Limits Conversation: Simple Visual Recognition/Discrimination Discrimination: Not tested Reading Comprehension Reading Status: Not tested Expression Expression Primary Mode of Expression: Verbal Verbal Expression Overall Verbal Expression: Appears within functional limits for tasks assessed Initiation: No impairment Repetition: No impairment Naming: No impairment Pragmatics: No impairment Interfering Components: Attention Non-Verbal Means of Communication: Not applicable Written Expression Dominant Hand: Right Written Expression: Not tested Oral Motor Oral Motor/Sensory Function Overall Oral Motor/Sensory Function: Within functional limits Motor Speech Overall Motor Speech: Appears within functional limits for tasks assessed Respiration: Within functional limits Phonation: Normal Resonance: Within functional limits Articulation: Within functional limitis Intelligibility: Intelligible Motor Planning: Witnin functional limits Motor Speech Errors: Not applicable   Intelligibility: Intelligible   Short Term Goals: Week 1: SLP Short Term Goal 1 (Week 1): Pt will sustain attention to tasks for 5 minute intervals with Mod A cues  for redirection. SLP Short Term Goal 2 (Week 1): Pt will recall new and/or complex daily information with Mod A verbal/visual cues for use of compensatory strategies. SLP Short Term Goal 3 (Week 1): Pt will demonstrate problem solving during basic to mildly complex tasks with Mod A verbal/visual cues. SLP Short Term Goal 4 (Week 1): Pt will demonstrate emergent awareness by detecting errors during functional tasks with Mod A verbal/visual cues.  Refer to Care Plan for Long Term Goals  Recommendations for other services: None   Discharge Criteria: Patient will be discharged from SLP if patient refuses treatment 3 consecutive times without medical reason, if treatment goals not met, if there is a change in medical status, if patient makes no progress towards goals or if patient is discharged from hospital.  The above assessment, treatment plan, treatment alternatives and goals were discussed and mutually agreed upon: by patient  Arbutus Leas 09/15/2019, 9:55 AM

## 2019-09-16 ENCOUNTER — Inpatient Hospital Stay (HOSPITAL_COMMUNITY): Payer: Medicare Other | Admitting: Physical Therapy

## 2019-09-16 ENCOUNTER — Inpatient Hospital Stay (HOSPITAL_COMMUNITY): Payer: Medicare Other | Admitting: Occupational Therapy

## 2019-09-16 ENCOUNTER — Inpatient Hospital Stay (HOSPITAL_COMMUNITY): Payer: Medicare Other | Admitting: Speech Pathology

## 2019-09-16 ENCOUNTER — Ambulatory Visit: Payer: Medicare Other | Admitting: Neurology

## 2019-09-16 ENCOUNTER — Inpatient Hospital Stay (HOSPITAL_COMMUNITY): Payer: Medicare Other

## 2019-09-16 LAB — GLUCOSE, CAPILLARY
Glucose-Capillary: 146 mg/dL — ABNORMAL HIGH (ref 70–99)
Glucose-Capillary: 206 mg/dL — ABNORMAL HIGH (ref 70–99)
Glucose-Capillary: 151 mg/dL — ABNORMAL HIGH (ref 70–99)
Glucose-Capillary: 178 mg/dL — ABNORMAL HIGH (ref 70–99)

## 2019-09-16 MED ORDER — METFORMIN HCL 500 MG PO TABS
250.0000 mg | ORAL_TABLET | Freq: Two times a day (BID) | ORAL | Status: DC
  Administered 2019-09-16 – 2019-09-18 (×4): 250 mg via ORAL
  Filled 2019-09-16 (×5): qty 1

## 2019-09-16 NOTE — Progress Notes (Signed)
Occupational Therapy Session Note  Patient Details  Name: [name] [name] MRN: [id number] Date of Birth: 05-10-1951  Today's Date: 09/16/2019 OT Individual Time: 1300-1330 OT Individual Time Calculation (min): 30 min    Short Term Goals: Week 1:  OT Short Term Goal 1 (Week 1): Pt will complete sit<>stand with consistent mod A in preparation for BADL tasks OT Short Term Goal 2 (Week 1): Pt will locate 2 grooming items on L side of the sink with min questioing cues OT Short Term Goal 3 (Week 1): Pt will complete 1 step of UB dressing task without verbal cues  Skilled Therapeutic Interventions/Progress Updates:    1:1 Pt in bed finishing lunch. Pt was trying to eat the rest of her lunch with a straw instead of a utensil with no recognition of error. Pt came to EOB with max A. Pt able to sit EOB but required total A to come into the w/c and therapist would have benefited from a 2nd person assisting with transfer. Attempted to transfer to the toilet in the bathroom but unable to safely transfer. Used the STEDY to transfer to the [facility] over the commode. Pt required total A to come into a more upright position to perform clothing  Management. Pt had soiled brief but was able to void on the toilet. Transferred the pt back to the tilt in space with total A with the STEDY. Left with seat belt on.   Therapy Documentation Precautions:  Precautions Precautions: Fall Precaution Comments: recent L humeral fx, no orders Restrictions Weight Bearing Restrictions: No General:   Vital Signs: Therapy Vitals Temp: 97.8 F (36.6 C) Pulse Rate: 83 Resp: 16 BP: (!) 135/55 Patient Position (if appropriate): Sitting Oxygen Therapy SpO2: 94 % O2 Device: Room Air Pain: Pain Assessment Pain Score: 6  Pain Type: Acute pain Pain Location: Head Pain Orientation: Anterior Pain Descriptors / Indicators: Aching Pain Onset: Gradual Patients Stated Pain Goal: 2 Pain Intervention(s): Repositioned;RN made  aware Multiple Pain Sites: No ADL: ADL Eating: Minimal assistance Grooming: Minimal assistance Upper Body Bathing: Not assessed Lower Body Bathing: Not assessed Upper Body Dressing: Maximal assistance Lower Body Dressing: Maximal assistance, Dependent Toileting: Maximal assistance Toilet Transfer: Dependent, Maximal assistance Vision   Perception    Praxis   Exercises:   Other Treatments:     Therapy/Group: Individual Therapy  [facility] [place] 09/16/2019, 3:47 PM

## 2019-09-16 NOTE — Progress Notes (Signed)
Social Work Assessment and Plan   Patient Details  Name: [name] [name] MRN: [id number] Date of Birth: [birth year]  Today's Date: 09/16/2019  Problem List:  Patient Active Problem List   Diagnosis Date Noted  . Acute bilat watershed infarction [facility]) 09/14/2019  . CKD (chronic kidney disease), stage II   . Uncontrolled type 2 diabetes mellitus with hyperglycemia ([place])   . PFO (patent foramen ovale)   . Acute CVA (cerebrovascular accident) ([place]) 09/11/2019  . Subarachnoid hemorrhage ([place]) 09/11/2019  . DVT (deep venous thrombosis) ([place]) 09/11/2019  . Aneurysm, thoracic aortic ([place])   . Embolic stroke ([place])   . Pulmonary embolism ([place])   . Acute cerebrovascular accident (CVA) ([place]) 08/18/2019  . COPD (chronic obstructive pulmonary disease) ([place])   . COPD exacerbation ([place]) 08/12/2018  . Hyponatremia 08/12/2018  . Acute respiratory failure with hypoxia ([place]) 10/26/2015  . CAP (community acquired pneumonia) 10/26/2015  . Type 2 diabetes mellitus ([place]) 10/26/2015  . Overactive bladder 09/10/2015  . Diabetes mellitus type 2, controlled, without complications ([place])   . Depression   . Insomnia   . GERD (gastroesophageal reflux disease)   . Hypercholesteremia   . Hypertension   . Chronic kidney disease   . Overweight 10/29/2009  . GERD 06/20/2008  . CHEST PAIN 06/20/2008  . CHOLECYSTECTOMY, HX OF 06/20/2008  . HYPERLIPIDEMIA 12/11/2007  . TOBACCO ABUSE 12/11/2007  . DEPRESSION 12/11/2007  . Essential hypertension 12/11/2007  . Coronary atherosclerosis 12/11/2007  . DUODENITIS, WITH HEMORRHAGE 12/11/2007   Past Medical History:  Past Medical History:  Diagnosis Date  . Aneurysm, thoracic aortic ([place])   . Anxiety   . Arthritis   . CAD (coronary artery disease)   . Chronic kidney disease    Renal insufficiency  . COPD (chronic obstructive pulmonary disease) ([place])   . Depression   . Diabetes mellitus   . Diverticulosis of colon (without mention of hemorrhage)   .  Duodenitis without mention of hemorrhage   . Embolic stroke ([place])   . GERD (gastroesophageal reflux disease)   . Hemorrhoids   . Hypercholesteremia   . Hypertension   . Insomnia   . Pulmonary embolism ([place])   . Tinnitus   . Vertigo    Past Surgical History:  Past Surgical History:  Procedure Laterality Date  . ANGIOPLASTY    . BUBBLE STUDY  09/14/2019   Procedure: BUBBLE STUDY;  Surgeon: Donato Heinz, MD;  Location: [facility];  Service: Endoscopy;;  . CHOLECYSTECTOMY    . ROTATOR CUFF REPAIR Right 04/2012  . TEE WITHOUT CARDIOVERSION N/A 09/14/2019   Procedure: TRANSESOPHAGEAL ECHOCARDIOGRAM (TEE);  Surgeon: Donato Heinz, MD;  [facility] ENDOSCOPY;  Service: Endoscopy;  Laterality: N/A;  . TUBAL LIGATION     Social History:  reports that she quit smoking about 13 months ago. Her smoking use included cigarettes. She has never used smokeless tobacco. She reports that she does not drink alcohol or use drugs.  Family / Support Systems Marital Status: Widow/Widower How Long?: July 2020 Patient Roles: Parent Children: son, Anyjah Cundiff @ [phone] Anticipated Caregiver: son, daughter in law and 47 year old grand daughter Ability/Limitations of Caregiver: son works days; 28 year old had been assisting as needed during the day Caregiver Availability: 24/7 Family Dynamics: Pt describes son and his family as very supportive.  Social History Preferred language: English Religion: Baptist Cultural Background: NA Read: Yes Write: Yes Employment Status: Retired Public relations account executive Issues: none Guardian/Conservator: none - per MD, pt is  capable of making decisions on her own behalf.   Abuse/Neglect Abuse/Neglect Assessment Can Be Completed: Yes Physical Abuse: Denies Verbal Abuse: Denies Sexual Abuse: Denies Exploitation of patient/patient's resources: Denies Self-Neglect: Denies  Emotional Status Pt's affect, behavior and adjustment status: Pt  sitting up with w/c and able to complete assessment without much difficulty, however, did appear very fatigued.  Speech was somewhat slowed but intelligible.  Flat affect overall and denies any significant emotional distress.  Did speak briefly about her husband's death and she does endorse grief/ depression following that.  Will refer for neuropsychology consult for additional mood/ coping assessment. Recent Psychosocial Issues: Husband's suicide in 03/16/2019 Psychiatric History: none per pt report Substance Abuse History: none  Patient / Family Perceptions, Expectations & Goals Pt/Family understanding of illness & functional limitations: Pt and son with good, general understanding of her stroke(s) and current functional limitations Premorbid pt/family roles/activities: Pt was indepedent overall Anticipated changes in roles/activities/participation: Son and dtr-in-law to provide primary caregiver support. Pt/family expectations/goals: Pt hopeful to reach some level of independence again, however, realistic that she will need assistance at hospital d/c.  Community Resources [employer]s: None Premorbid Home Care/DME Agencies: None Transportation available at discharge: yes Resource referrals recommended: Neuropsychology  Discharge Planning Living Arrangements: Alone Support Systems: Children, Other relatives Type of Residence: Private residence Insurance Resources: [employer] Resources: Radio broadcast assistant Screen Referred: No Money Management: Patient Does the patient have any problems obtaining your medications?: No Home Management: pt Patient/Family Preliminary Plans: Pt to return to her home with son and his family providing 24/7 assistance. Social Work Anticipated Follow Up Needs: HH/OP Expected length of stay: 3-4 weeks  Clinical Impression Elderly woman here following another stroke/ extension and with significant physical limitations and longer LOS expected.   Good support from son and his family.  Pt denies any significant emotional distress, however, of note her husband died in 03-16-19.  Will refer for neuropsychology consult.  Will follow for d/c planning and support needs.  Bryanah Sidell 09/16/2019, 3:27 PM

## 2019-09-16 NOTE — Progress Notes (Signed)
Occupational Therapy Session Note  Patient Details  Name: [name] [name] MRN: [id number] Date of Birth: 07/23/1951  Today's Date: 09/16/2019 OT Individual Time: [id number] OT Individual Time Calculation (min): 55 min    Short Term Goals: Week 1:  OT Short Term Goal 1 (Week 1): Pt will complete sit<>stand with consistent mod A in preparation for BADL tasks OT Short Term Goal 2 (Week 1): Pt will locate 2 grooming items on L side of the sink with min questioing cues OT Short Term Goal 3 (Week 1): Pt will complete 1 step of UB dressing task without verbal cues  Skilled Therapeutic Interventions/Progress Updates:    1;1. Pt received in bed agreeable to OT. Pt requesting to reposition for eating reporting pain in RUE. Pt rolls B with doffing soiled brief and donning clean brief and pants. Pt completes MAX A stand pivot transfer with +2 present for safety steadying hips as pt demo significant motor planning deficits for pivoting to chair to R. Pt eats meal with pillow supporting LUE and all items placed on L with mod VC for locating/visual scanning. Educated pt on estim for preventing shoulder sublux, pain relief and NMR, however all units being used at this time. Pt agreeale to attempt at a later date. Pt completes stand pivot transfer back to bed as motor perseverations pushing pt to L with no awareness or ability to repositon hips will increase fall risk from standard chair. Consulted with PT to get pt TIS. Exited session with pt seated in bed, exit alarm on and cal light tin reach  Therapy Documentation Precautions:  Precautions Precautions: Fall Precaution Comments: recent L humeral fx, no orders Restrictions Weight Bearing Restrictions: No General:   Vital Signs:  Pain:   ADL: ADL Eating: Minimal assistance Grooming: Minimal assistance Upper Body Bathing: Not assessed Lower Body Bathing: Not assessed Upper Body Dressing: Maximal assistance Lower Body Dressing: Maximal assistance,  Dependent Toileting: Maximal assistance Toilet Transfer: Dependent, Maximal assistance Vision   Perception    Praxis   Exercises:   Other Treatments:     Therapy/Group: Individual Therapy  Tonny Branch 09/16/2019, 8:59 AM

## 2019-09-16 NOTE — Discharge Instructions (Signed)
Inpatient Rehab Discharge Instructions  [name] [name] Discharge date and time: No discharge date for patient encounter.   Activities/Precautions/ Functional Status: Activity: activity as tolerated Diet: diabetic diet Wound Care: none needed Functional status:  ___ No restrictions     ___ Walk up steps independently ___ 24/7 supervision/assistance   ___ Walk up steps with assistance ___ Intermittent supervision/assistance  ___ Bathe/dress independently ___ Walk with walker     _x__ Bathe/dress with assistance ___ Walk Independently    ___ Shower independently ___ Walk with assistance    ___ Shower with assistance ___ No alcohol     ___ Return to work/school ________  Special Instructions: No driving smoking or alcohol STROKE/TIA DISCHARGE INSTRUCTIONS SMOKING Cigarette smoking nearly doubles your risk of having a stroke & is the single most alterable risk factor  If you smoke or have smoked in the last 12 months, you are advised to quit smoking for your health.  Most of the excess cardiovascular risk related to smoking disappears within a year of stopping.  Ask you doctor about anti-smoking medications  [place] Quit Line: 1-800-QUIT NOW  Free Smoking Cessation Classes [phone]  CHOLESTEROL Know your levels; limit fat & cholesterol in your diet  Lipid Panel     Component Value Date/Time   CHOL 136 08/19/2019 0500   TRIG 70 08/19/2019 0500   HDL 53 08/19/2019 0500   CHOLHDL 2.6 08/19/2019 0500   VLDL 14 08/19/2019 0500   LDLCALC 69 08/19/2019 0500   LDLCALC 98 04/26/2019 0932      Many patients benefit from treatment even if their cholesterol is at goal.  Goal: Total Cholesterol (CHOL) less than 160  Goal:  Triglycerides (TRIG) less than 150  Goal:  HDL greater than 40  Goal:  LDL (LDLCALC) less than 100   BLOOD PRESSURE American Stroke Association blood pressure target is less that 120/80 mm/Hg  Your discharge blood pressure is:  BP: (!) 172/83  Monitor your  blood pressure  Limit your salt and alcohol intake  Many individuals will require more than one medication for high blood pressure  DIABETES (A1c is a blood sugar average for last 3 months) Goal HGBA1c is under 7% (HBGA1c is blood sugar average for last 3 months)  Diabetes:     Lab Results  Component Value Date   HGBA1C 9.4 (H) 08/19/2019     Your HGBA1c can be lowered with medications, healthy diet, and exercise.  Check your blood sugar as directed by your physician  Call your physician if you experience unexplained or low blood sugars.  PHYSICAL ACTIVITY/REHABILITATION Goal is 30 minutes at least 4 days per week  Activity: Increase activity slowly, Therapies: Physical Therapy: Home Health Return to work:   Activity decreases your risk of heart attack and stroke and makes your heart stronger.  It helps control your weight and blood pressure; helps you relax and can improve your mood.  Participate in a regular exercise program.  Talk with your doctor about the best form of exercise for you (dancing, walking, swimming, cycling).  DIET/WEIGHT Goal is to maintain a healthy weight  Your discharge diet is:  Diet Order            Diet Carb Modified Fluid consistency: Thin; Room service appropriate? Yes  Diet effective now              liquids Your height is:  Height: 5\' 4"  (162.6 cm) Your current weight is: Weight: 91 kg Your Body Mass  Index (BMI) is:  BMI (Calculated): 34.42  Following the type of diet specifically designed for you will help prevent another stroke.  Your goal weight range is:    Your goal Body Mass Index (BMI) is 19-24.  Healthy food habits can help reduce 3 risk factors for stroke:  High cholesterol, hypertension, and excess weight.  RESOURCES Stroke/Support Group:  Call [phone]   STROKE EDUCATION PROVIDED/REVIEWED AND GIVEN TO PATIENT Stroke warning signs and symptoms How to activate emergency medical system (call 911). Medications prescribed at  discharge. Need for follow-up after discharge. Personal risk factors for stroke. Pneumonia vaccine given:  Flu vaccine given:  My questions have been answered, the writing is legible, and I understand these instructions.  I will adhere to these goals & educational materials that have been provided to me after my discharge from the hospital.      My questions have been answered and I understand these instructions. I will adhere to these goals and the provided educational materials after my discharge from the hospital.  Patient/Caregiver Signature _______________________________ Date __________  Clinician Signature _______________________________________ Date __________  Please bring this form and your medication list with you to all your follow-up doctor's appointments.  ====================================================================== Information on my medicine - ELIQUIS (apixaban)  This medication education was reviewed with me or my healthcare representative as part of my discharge preparation.    Why was Eliquis prescribed for you? Eliquis was prescribed to treat blood clots that may have been found in the veins of your legs (deep vein thrombosis) or in your lungs (pulmonary embolism) and to reduce the risk of them occurring again.  What do You need to know about Eliquis ? The dose is ONE 5 mg tablet taken TWICE daily.  Eliquis may be taken with or without food.   Try to take the dose about the same time in the morning and in the evening. If you have difficulty swallowing the tablet whole please discuss with your pharmacist how to take the medication safely.  Take Eliquis exactly as prescribed and DO NOT stop taking Eliquis without talking to the doctor who prescribed the medication.  Stopping may increase your risk of developing a new blood clot.  Refill your prescription before you run out.  After discharge, you should have regular check-up appointments with your  healthcare provider that is prescribing your Eliquis.    What do you do if you miss a dose? If a dose of ELIQUIS is not taken at the scheduled time, take it as soon as possible on the same day and twice-daily administration should be resumed. The dose should not be doubled to make up for a missed dose.  Important Safety Information A possible side effect of Eliquis is bleeding. You should call your healthcare provider right away if you experience any of the following: ? Bleeding from an injury or your nose that does not stop. ? Unusual colored urine (red or dark brown) or unusual colored stools (red or black). ? Unusual bruising for unknown reasons. ? A serious fall or if you hit your head (even if there is no bleeding).  Some medicines may interact with Eliquis and might increase your risk of bleeding or clotting while on Eliquis. To help avoid this, consult your healthcare provider or pharmacist prior to using any new prescription or non-prescription medications, including herbals, vitamins, non-steroidal anti-inflammatory drugs (NSAIDs) and supplements.  This website has more information on Eliquis (apixaban): http://www.eliquis.com/eliquis/home  ============================================================= Pulmonary Embolism    A pulmonary  embolism (PE) is a sudden blockage or decrease of blood flow in one or both lungs. Most blockages come from a blood clot that forms in the vein of a lower leg, thigh, or arm (deep vein thrombosis, DVT) and travels to the lungs. A clot is blood that has thickened into a gel or solid. PE is a dangerous and life-threatening condition that needs to be treated right away.  What are the causes? This condition is usually caused by a blood clot that forms in a vein and moves to the lungs. In rare cases, it may be caused by air, fat, part of a tumor, or other tissue that moves through the veins and into the lungs.  What increases the risk? The following  factors may make you more likely to develop this condition: 1. Experiencing a traumatic injury, such as breaking a hip or leg. 2. Having: ? A spinal cord injury. ? Orthopedic surgery, especially hip or knee replacement. ? Any major surgery. ? A stroke. ? DVT. ? Blood clots or blood clotting disease. ? Long-term (chronic) lung or heart disease. ? Cancer treated with chemotherapy. ? A central venous catheter. 3. Taking medicines that contain estrogen. These include birth control pills and hormone replacement therapy. 4. Being: ? Pregnant. ? In the period of time after your baby is delivered (postpartum). ? Older than age 38. ? Overweight. ? A smoker, especially if you have other risks.  What are the signs or symptoms? Symptoms of this condition usually start suddenly and include:  Shortness of breath during activity or at rest.  Coughing, coughing up blood, or coughing up blood-tinged mucus.  Chest pain that is often worse with deep breaths.  Rapid or irregular heartbeat.  Feeling light-headed or dizzy.  Fainting.  Feeling anxious.  Fever.  Sweating.  Pain and swelling in a leg. This is a symptom of DVT, which can lead to PE. How is this diagnosed? This condition may be diagnosed based on:  Your medical history.  A physical exam.  Blood tests.  CT pulmonary angiogram. This test checks blood flow in and around your lungs.  Ventilation-perfusion scan, also called a lung VQ scan. This test measures air flow and blood flow to the lungs.  An ultrasound of the legs.  How is this treated? Treatment for this condition depends on many factors, such as the cause of your PE, your risk for bleeding or developing more clots, and other medical conditions you have. Treatment aims to remove, dissolve, or stop blood clots from forming or growing larger. Treatment may include: 1. Medicines, such as: ? Blood thinning medicines (anticoagulants) to stop clots from  forming. ? Medicines that dissolve clots (thrombolytics). 2. Procedures, such as: ? Using a flexible tube to remove a blood clot (embolectomy) or to deliver medicine to destroy it (catheter-directed thrombolysis). ? Inserting a filter into a large vein that carries blood to the heart (inferior vena cava). This filter (vena cava filter) catches blood clots before they reach the lungs. ? Surgery to remove the clot (surgical embolectomy). This is rare. You may need a combination of immediate, long-term (up to 3 months after diagnosis), and extended (more than 3 months after diagnosis) treatments. Your treatment may continue for several months (maintenance therapy). You and your health care provider will work together to choose the treatment program that is best for you.  Follow these instructions at home: Medicines 1. Take over-the-counter and prescription medicines only as told by your health care provider. 2. If  you are taking an anticoagulant medicine: ? Take the medicine every day at the same time each day. ? Understand what foods and drugs interact with your medicine. ? Understand the side effects of this medicine, including excessive bruising or bleeding. Ask your health care provider or pharmacist about other side effects.  General instructions  Wear a medical alert bracelet or carry a medical alert card that says you have had a PE and lists what medicines you take.  Ask your health care provider when you may return to your normal activities. Avoid sitting or lying for a long time without moving.  Maintain a healthy weight. Ask your health care provider what weight is healthy for you.  Do not use any products that contain nicotine or tobacco, such as cigarettes, e-cigarettes, and chewing tobacco. If you need help quitting, ask your health care provider.  Talk with your health care provider about any travel plans. It is important to make sure that you are still able to take your medicine  while on trips.  Keep all follow-up visits as told by your health care provider. This is important.  Contact a health care provider if:  You missed a dose of your blood thinner medicine.  Get help right away if: 1. You have: ? New or increased pain, swelling, warmth, or redness in an arm or leg. ? Numbness or tingling in an arm or leg. ? Shortness of breath during activity or at rest. ? A fever. ? Chest pain. ? A rapid or irregular heartbeat. ? A severe headache. ? Vision changes. ? A serious fall or accident, or you hit your head. ? Stomach (abdominal) pain. ? Blood in your vomit, stool, or urine. ? A cut that will not stop bleeding. 2. You cough up blood. 3. You feel light-headed or dizzy. 4. You cannot move your arms or legs. 5. You are confused or have memory loss.  These symptoms may represent a serious problem that is an emergency. Do not wait to see if the symptoms will go away. Get medical help right away. Call your local emergency services (911 in the [place] Do not drive yourself to the hospital. Summary  A pulmonary embolism (PE) is a sudden blockage or decrease of blood flow in one or both lungs. PE is a dangerous and life-threatening condition that needs to be treated right away.  Treatments for this condition usually include medicines to thin your blood (anticoagulants) or medicines to break apart blood clots (thrombolytics).  If you are given blood thinners, it is important to take the medicine every day at the same time each day.  Understand what foods and drugs interact with any medicines that you are taking.  If you have signs of PE or DVT, call your local emergency services (911 in the [place] This information is not intended to replace advice given to you by your health care provider. Make sure you discuss any questions you have with your health care provider. Document Revised: 05/05/2018 Document Reviewed: 05/05/2018 Elsevier Patient Education  2020  [name].

## 2019-09-16 NOTE — Progress Notes (Signed)
Physical Therapy Session Note  Patient Details  Name: [name] [name] MRN: [id number] Date of Birth: Feb 24, 1951  Today's Date: 09/16/2019 PT Individual Time: 1020-1100 PT Individual Time Calculation (min): 40 min   Short Term Goals: Week 1:  PT Short Term Goal 1 (Week 1): Pt will initiate gait training PT Short Term Goal 2 (Week 1): Pt will perform bed mobility w/ mod assist PT Short Term Goal 3 (Week 1): Pt will perform bed<>chair transfer w/ max assist consistently PT Short Term Goal 4 (Week 1): Pt will attend to L side of body during functional mobility w/ min cues  Skilled Therapeutic Interventions/Progress Updates:   Pt received in supine and agreeable to therapy, denies pain. Supine>sit w/ total assist w/ verbal and tactile cues to attend to L side of body. Stand pivot to w/c w/ mod assist to stand and total assist to swing hips into chair. This also required multiple attempts 2/2 impaired motor planning and slow processing, verbal and visual cues for technique and UE placement. Pt unable to take pivot step w/ RLE, prompting total assist to pivot onto chair. Pt in [place] 2/2 OT report of restlessness and poor postural control in standard manual chair, as well as to work on OOB tolerance. Total assist w/c transport to/from therapy gym. Worked on upright tolerance and cognitive remediation while sitting in full upright - therapist adjusting aspects of w/c to pt's size. Worked on attention to task, following simple commands, and initiation/termination of motor task while identifying red pegs and placing on peg board. Needed mod cues overall, esp for initiation and termination of task. Pt needed 2-3 rest breaks tilted back 2/2 fatigue. Pt also c/o 4/10 headache towards end of session. Returned to room and BP taken, see details below, WNL for pt and RN present and aware of pain. Total assist stand/squat pivot to EOB and sit>supine. Ended session in supine 2/2 fatigue, all needs in reach.   Therapy  Documentation Precautions:  Precautions Precautions: Fall Precaution Comments: recent L humeral fx, no orders Restrictions Weight Bearing Restrictions: No Vital Signs: Therapy Vitals BP: (!) 161/62 Patient Position (if appropriate): Sitting Pain: Pain Assessment Pain Scale: 0-10 Pain Score: 5  Pain Type: Acute pain Pain Location: Back Pain Orientation: Left Pain Descriptors / Indicators: Aching Pain Intervention(s): Medication (See eMAR)  Therapy/Group: Individual Therapy  [name] [name] [name] [name] 09/16/2019, 12:11 PM

## 2019-09-16 NOTE — Progress Notes (Signed)
Speech Language Pathology Daily Session Note  Patient Details  Name: [name] [name] MRN: [id number] Date of Birth: Oct 11, 1950  Today's Date: 09/16/2019 SLP Individual Time: 1355-1430 SLP Individual Time Calculation (min): 35 min and Today's Date: 09/16/2019 SLP Missed Time: 25 Minutes Missed Time Reason: Pain  Short Term Goals: Week 1: SLP Short Term Goal 1 (Week 1): Pt will sustain attention to tasks for 5 minute intervals with Mod A cues for redirection. SLP Short Term Goal 2 (Week 1): Pt will recall new and/or complex daily information with Mod A verbal/visual cues for use of compensatory strategies. SLP Short Term Goal 3 (Week 1): Pt will demonstrate problem solving during basic to mildly complex tasks with Mod A verbal/visual cues. SLP Short Term Goal 4 (Week 1): Pt will demonstrate emergent awareness by detecting errors during functional tasks with Mod A verbal/visual cues.  Skilled Therapeutic Interventions: Skilled treatment session focused on cognitive goals. SLP facilitated session by providing Max A verbal cues for sustained attention to task due to headache pain, RN made aware. Patient required Max-Total A to self-monitor and correct errors during a basic reading task due to left inattention requiring both verbal and visual cues. Patient also required Max verbal cues to scan to left field of environment to locate specific items from a field of 6. Patient groaning and rubbing her head frequently throughout session, therefore, patient missed remaining 25 minutes due to pain. Patient left reclined in tilt-in-space wheelchair with alarm on and all needs within reach. Continue with current plan of care.      Pain Pain Assessment Pain Score: 6  Pain Type: Acute pain Pain Location: Head Pain Orientation: Anterior Pain Descriptors / Indicators: Aching Pain Onset: Gradual Patients Stated Pain Goal: 2 Pain Intervention(s): Repositioned;RN made aware Multiple Pain Sites:  No  Therapy/Group: Individual Therapy  Diontae Route 09/16/2019, 2:53 PM

## 2019-09-16 NOTE — Care Management (Signed)
[facility] Individual Statement of Services  Patient Name:  [name] [name]  Date:  09/16/2019  Welcome to the [facility].  Our goal is to provide you with an individualized program based on your diagnosis and situation, designed to meet your specific needs.  With this comprehensive rehabilitation program, you will be expected to participate in at least 3 hours of rehabilitation therapies Monday-Friday, with modified therapy programming on the weekends.  Your rehabilitation program will include the following services:  Physical Therapy (PT), Occupational Therapy (OT), Speech Therapy (ST), 24 hour per day rehabilitation nursing, Therapeutic Recreaction (TR), Neuropsychology, Case Management (Social Worker), Rehabilitation Medicine, Nutrition Services and Pharmacy Services  Weekly team conferences will be held on Tuesdays to discuss your progress.  Your Social Worker will talk with you frequently to get your input and to update you on team discussions.  Team conferences with you and your family in attendance may also be held.  Expected length of stay: 3-4 weeks   Overall anticipated outcome: min assistance  Depending on your progress and recovery, your program may change. Your Social Worker will coordinate services and will keep you informed of any changes. Your Social Worker's name and contact numbers are listed  below.  The following services may also be recommended but are not provided by the [facility] will be made to provide these services after discharge if needed.  Arrangements include referral to agencies that provide these services.  Your insurance has been verified to be:  Medicare; [place] [facility] Your primary doctor is:  [place]  Pertinent information will be shared with your doctor and your insurance  company.  Social Worker:  [place], [place] or ([phone]   Information discussed with and copy given to patient by: Lennart Pall, 09/16/2019, 3:32 PM

## 2019-09-16 NOTE — IPOC Note (Signed)
Overall Plan of Care [facility]) Patient Details Name: [name] [name] MRN: [id number] DOB: Aug 16, 1950  Admitting Diagnosis: Acute bilat watershed infarction [facility])  Hospital Problems: Principal Problem:   Acute bilat watershed infarction [facility])     Functional Problem List: Nursing Bladder, Bowel, Endurance, Skin Integrity, Safety, Pain, Nutrition, Medication Management  PT Balance, Endurance, Motor, Behavior, Perception, Safety  OT Balance, Cognition, Behavior, Endurance, Motor, Perception, Safety  SLP Cognition, Perception  TR         Basic ADL's: OT Eating, Bathing, Grooming, Dressing, Toileting     Advanced  ADL's: OT       Transfers: PT Bed Mobility, Floor, Bed to Chair, Car, Manufacturing systems engineer, Metallurgist: PT Ambulation, Emergency planning/management officer, Stairs     Additional Impairments: OT Fuctional Use of Upper Extremity  SLP Social Cognition   Problem Solving, Memory, Awareness, Attention  TR      Anticipated Outcomes Item Anticipated Outcome  Self Feeding Supervision  Swallowing      Basic self-care  Min A  Toileting  Min A   Bathroom Transfers Min A  Bowel/Bladder  To be continent of bowel and bladder with min assist  Transfers  min assist  Locomotion  mod assist short distance gait w/ LRAD, otherwise primarily w/c level  Communication     Cognition  Min A  Pain  <3 on a 0-10 pain scale  Safety/Judgment  Supervision assistance   Therapy Plan: PT Intensity: Minimum of 1-2 x/day ,45 to 90 minutes PT Frequency: 5 out of 7 days PT Duration Estimated Length of Stay: 3-4 weeks OT Intensity: Minimum of 1-2 x/day, 45 to 90 minutes OT Frequency: 5 out of 7 days OT Duration/Estimated Length of Stay: 3-4 weeks SLP Intensity: Minumum of 1-2 x/day, 30 to 90 minutes SLP Frequency: 3 to 5 out of 7 days SLP Duration/Estimated Length of Stay: 3-4 weeks   Due to the current state of emergency, patients may not be receiving their 3-hours of  Medicare-mandated therapy.   Team Interventions: Nursing Interventions Patient/Family Education, Bladder Management, Bowel Management, Dysphagia/Aspiration Precaution Training, Disease Management/Prevention, Discharge Planning, Pain Management, Medication Management, Psychosocial Support  PT interventions Ambulation/gait training, Cognitive remediation/compensation, Discharge planning, DME/adaptive equipment instruction, Functional mobility training, Pain management, Psychosocial support, Splinting/orthotics, Therapeutic Activities, UE/LE Strength taining/ROM, Visual/perceptual remediation/compensation, Wheelchair propulsion/positioning, UE/LE Coordination activities, Therapeutic Exercise, Stair training, Skin care/wound management, Patient/family education, Neuromuscular re-education, Functional electrical stimulation, Disease management/prevention, Academic librarian, Training and development officer  OT Interventions Training and development officer, Community reintegration, Cognitive remediation/compensation, Discharge planning, DME/adaptive equipment instruction, Functional electrical stimulation, Functional mobility training, Neuromuscular re-education, Pain management, Patient/family education, Self Care/advanced ADL retraining, Splinting/orthotics, Therapeutic Activities, Therapeutic Exercise, UE/LE Strength taining/ROM, UE/LE Coordination activities, Wheelchair propulsion/positioning, Visual/perceptual remediation/compensation  SLP Interventions Cognitive remediation/compensation, Cueing hierarchy, Functional tasks, Internal/external aids, Patient/family education, Therapeutic Activities  TR Interventions    SW/CM Interventions Discharge Planning, Psychosocial Support, Patient/Family Education   Barriers to Discharge MD  Medical stability  Nursing Medication compliance, Lack of/limited family support, Medical stability, Decreased caregiver support    PT Decreased caregiver support, Medical  stability Pt w/ no caregiver during day (would not recommend pt's 14 yo daughter provide care during day when son and daughter-in-law work)  OT      SLP Lack of/limited family support    SW       Team Discharge Planning: Destination: PT-Home ,OT- Home , SLP-(Home VS SNF) Projected Follow-up: PT-Home health PT(SNF if family cannot provide 24/7 assist from adult over age  of 18), OT-  Home health OT, SLP-Skilled Nursing facility, Home Health SLP, 24 hour supervision/assistance Projected Equipment Needs: PT-To be determined, OT- To be determined, SLP-None recommended by SLP Equipment Details: PT-has RW and w/c, OT-  Patient/family involved in discharge planning: PT- Patient unable/family or caregiver not available,  OT-Patient, SLP-Patient  MD ELOS: 21-28 days Medical Rehab Prognosis:  Excellent Assessment: The patient has been admitted for CIR therapies with the diagnosis of bilateral watershed infarcts, right frontal hemorrhage. The team will be addressing functional mobility, strength, stamina, balance, safety, adaptive techniques and equipment, self-care, bowel and bladder mgt, patient and caregiver education, NMR, visual-spatial awareness, orthotics, cognition, communication. Goals have been set at min assist with self-care and transfers, mod assist with gait, min assist with w/c mobility, min assist with cognition.   Due to the current state of emergency, patients may not be receiving their 3 hours per day of Medicare-mandated therapy.    Meredith Staggers, MD, FAAPMR      See Team Conference Notes for weekly updates to the plan of care

## 2019-09-16 NOTE — Progress Notes (Addendum)
[name] PHYSICAL MEDICINE & REHABILITATION PROGRESS NOTE   Subjective/Complaints: Slept ok. Feels that therapy went well. Has mild low back pain.   ROS: Patient denies fever, rash, sore throat, blurred vision, nausea, vomiting, diarrhea, cough, shortness of breath or chest pain, joint or back pain, headache, or mood change.   Objective:   CT HEAD WO CONTRAST  Result Date: 09/14/2019 CLINICAL DATA:  Neuro deficit with acute stroke suspected EXAM: CT HEAD WITHOUT CONTRAST TECHNIQUE: Contiguous axial images were obtained from the base of the skull through the vertex without intravenous contrast. COMPARISON:  09/11/2019 FINDINGS: Brain: Small patchy acute to subacute infarcts along the bilateral cerebral convexities with subarachnoid hemorrhage along the high and anterior right frontal sulcus, appearance stable from prior MRI. No evidence of acute infarct or interval hemorrhage. No hydrocephalus or worrisome swelling. Vascular: No hyperdense vessel or unexpected calcification. Skull: Normal. Negative for fracture or focal lesion. Sinuses/Orbits: No acute finding. IMPRESSION: Multifocal cortical infarction with small volume subarachnoid hemorrhage in the high right frontal lobe. No new or progressive finding when compared to brain MRI 09/11/2019. Electronically Signed   By: Monte Fantasia M.D.   On: 09/14/2019 11:07   ECHO TEE  Result Date: 09/14/2019   TRANSESOPHOGEAL ECHO REPORT   Patient Name:   [name] [name] Date of Exam: 09/14/2019 Medical Rec #:  [id number]       Height:       64.0 in Accession #:    [id number]      Weight:       200.6 lb Date of Birth:  09-20-1950      BSA:          1.96 m Patient Age:    69 years        BP:           139/85 mmHg Patient Gender: F               HR:           72 bpm. Exam Location:  Inpatient  Procedure: Transesophageal Echo, Cardiac Doppler and Color Doppler Indications:     stroke  History:         Patient has prior history of Echocardiogram examinations, most                   recent 08/19/2019.  Sonographer:     Johny Chess Referring Phys:  [id number] Woodfin Ganja THOMPSON Diagnosing Phys: Oswaldo Milian MD  PROCEDURE: Patients was monitored while under deep sedation. The transesophogeal probe was passed through the esophogus of the patient. The patient developed no complications during the procedure. IMPRESSIONS  1. Left ventricular ejection fraction, by visual estimation, is 60 to 65%. The left ventricle has normal function. There is mildly increased left ventricular hypertrophy.  2. Global right ventricle has normal systolic function.The right ventricular size is normal. No increase in right ventricular wall thickness.  3. The mitral valve is normal in structure. Trivial mitral valve regurgitation.  4. The tricuspid valve is normal in structure. Tricuspid valve regurgitation is trivial.  5. The aortic valve is tricuspid. Aortic valve regurgitation is mild.  6. The pulmonic valve was grossly normal. Pulmonic valve regurgitation is trivial.  7. Lipomatous hypertrophy of interatrial septum. Positive bubble study consistent wtih PFO. FINDINGS  Left Ventricle: Left ventricular ejection fraction, by visual estimation, is 60 to 65%. The left ventricle has normal function. The left ventricle has no regional wall motion abnormalities. There is mildly increased left ventricular  hypertrophy. Right Ventricle: The right ventricular size is normal. No increase in right ventricular wall thickness. Global RV systolic function is has normal systolic function. Left Atrium: Left atrial size was normal in size. Right Atrium: Right atrial size was normal in size Prominent Chiari network and Prominent Eustachian valve. Pericardium: There is no evidence of pericardial effusion. Mitral Valve: The mitral valve is normal in structure. Trivial mitral valve regurgitation. Tricuspid Valve: The tricuspid valve is normal in structure. Tricuspid valve regurgitation is trivial. Aortic Valve: The  aortic valve is tricuspid. Aortic valve regurgitation is mild. Pulmonic Valve: The pulmonic valve was grossly normal. Pulmonic valve regurgitation is trivial. Aorta: The aortic root is normal in size and structure. Shunts: Agitated saline contrast was given intravenously to evaluate for intracardiac shunting. Saline shunting was observed within 3-6 cardiac cycles suggestive of interatrial shunt. Evidence of atrial level shunting detected by color flow Doppler.  Oswaldo Milian MD Electronically signed by Oswaldo Milian MD Signature Date/Time: 09/14/2019/7:10:52 PM    Final    Recent Labs    09/14/19 0236 09/15/19 0537  WBC 9.2 9.5  HGB 11.9* 12.6  HCT 36.9 37.6  PLT 300 313   Recent Labs    09/14/19 0236 09/15/19 0537  NA 139 136  K 3.9 3.3*  CL 102 103  CO2 26 24  GLUCOSE 165* 180*  BUN 11 11  CREATININE 0.92 0.70  CALCIUM 8.7* 9.2    Intake/Output Summary (Last 24 hours) at 09/16/2019 1003 Last data filed at 09/16/2019 0900 Gross per 24 hour  Intake 315 ml  Output --  Net 315 ml     Physical Exam: Vital Signs Blood pressure (!) 182/75, pulse 81, temperature 98.3 F (36.8 C), resp. rate 18, height 5\' 4"  (1.626 m), weight 91 kg, SpO2 95 %. Constitutional: No distress . Vital signs reviewed. HEENT: EOMI, oral membranes moist Neck: supple Cardiovascular: RRR without murmur. No JVD    Respiratory: CTA Bilaterally without wheezes or rales. Normal effort    GI: BS +, non-tender, non-distended  Musculoskeletal:     Comments: no edema, left heel cord a little tight Neurological: She is alert and O x 3. No focal CN abnl. Left inattention.  Motor: RUE/RLE: 5/5 proximal distal LUE: Shoulder abduction, elbow flexion/extension tr-1/5, handgrip 3-4/5 but engages inconsistently LLE: Hip flexion tr-1/5, distally 0/5 Sensation diminished to touch bilateral plantar feet and LLE, minimal sensory loss LUE. No resting tone  Skin: Skin is warm and dry.  Psychiatric: pleasant,  delayed    Assessment/Plan: 1. Functional deficits secondary to bicortical infarcts, Right frontal SAH which require 3+ hours per day of interdisciplinary therapy in a comprehensive inpatient rehab setting.  Physiatrist is providing close team supervision and 24 hour management of active medical problems listed below.  Physiatrist and rehab team continue to assess barriers to discharge/monitor patient progress toward functional and medical goals  Care Tool:  Bathing  Bathing activity did not occur: Refused           Bathing assist       Upper Body Dressing/Undressing Upper body dressing   What is the patient wearing?: Pull over shirt    Upper body assist Assist Level: Maximal Assistance - Patient 25 - 49%    Lower Body Dressing/Undressing Lower body dressing      What is the patient wearing?: Pants, Incontinence brief     Lower body assist Assist for lower body dressing: Total Assistance - Patient < 25%     Toileting Toileting  Toileting assist Assist for toileting: Maximal Assistance - Patient 25 - 49%     Transfers Chair/bed transfer  Transfers assist     Chair/bed transfer assist level: Total Assistance - Patient < 25%     Locomotion Ambulation   Ambulation assist   Ambulation activity did not occur: Safety/medical concerns          Walk 10 feet activity   Assist  Walk 10 feet activity did not occur: Safety/medical concerns        Walk 50 feet activity   Assist Walk 50 feet with 2 turns activity did not occur: Safety/medical concerns         Walk 150 feet activity   Assist Walk 150 feet activity did not occur: Safety/medical concerns         Walk 10 feet on uneven surface  activity   Assist Walk 10 feet on uneven surfaces activity did not occur: Safety/medical concerns         Wheelchair     Assist Will patient use wheelchair at discharge?: No(LT goals not set )             Wheelchair 50 feet with  2 turns activity    Assist            Wheelchair 150 feet activity     Assist          Blood pressure (!) 182/75, pulse 81, temperature 98.3 F (36.8 C), resp. rate 18, height 5\' 4"  (1.626 m), weight 91 kg, SpO2 95 %.  Medical Problem List and Plan: 1.  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 Patient with bilateral cerebral and right occipital infarcts last week as well as pulmonary emboli with DVT.             -patient may shower             -ELOS/Goals: 17-20 days/Min A             -Patient continue CIR therapies including PT, OT, and SLP   -PRAFO LLE 2.  Antithrombotics: -DVT/anticoagulation: Eliquis             -antiplatelet therapy: N/A 3. Pain Management: Lidoderm patch change as directed, hydrocodone as needed 4. Mood: Valium as needed for anxiety and depression             -antipsychotic agents: N/A 5. Neuropsych: This patient is capable of making decisions on her own behalf. 6. Skin/Wound Care: Routine skin checks 7. Fluids/Electrolytes/Nutrition: encourage PO  -I personally reviewed the patient's labs today.     -K+ 3.3---repleting   2/5 recheck K+ monday 8.  PFO.  Follow cardiology services.  TEE completed, suggesting PFO, await further Cards recs.. 9.  Hypertension.  Cardizem 120 mg daily, Lasix 20 mg daily, Cozaar 50 mg daily            2/5  SBP elevated again this morning but was having some back discomfort  -follow for now as BP's have been inconsistent  --avoid hypotension 10.  Hyperlipidemia: Continue Lipitor 11.  Diabetes mellitus with hyperglycemia.  Hemoglobin A1c 9.4.  SSI.  Patient on Glucophage 1000 mg twice daily prior to admission.  Resume as needed            2/5 cbg's with intermittent elevation. Resume metformin at 250mg  bid 12.  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  Continue nebulizer treatments.      -  tolerating therapies thus far 13.  CKD stage II.                2.4 BUN/Cr 11/0.7 14.  Restless leg syndrome.  Requip 2 mg 3 times daily    LOS: 2 days A FACE TO FACE EVALUATION WAS PERFORMED  Meredith Staggers 09/16/2019, 10:03 AM

## 2019-09-17 ENCOUNTER — Inpatient Hospital Stay (HOSPITAL_COMMUNITY): Payer: Medicare Other | Admitting: Physical Therapy

## 2019-09-17 ENCOUNTER — Inpatient Hospital Stay (HOSPITAL_COMMUNITY): Payer: Medicare Other | Admitting: Speech Pathology

## 2019-09-17 ENCOUNTER — Inpatient Hospital Stay (HOSPITAL_COMMUNITY): Payer: Medicare Other

## 2019-09-17 LAB — GLUCOSE, CAPILLARY
Glucose-Capillary: 157 mg/dL — ABNORMAL HIGH (ref 70–99)
Glucose-Capillary: 171 mg/dL — ABNORMAL HIGH (ref 70–99)
Glucose-Capillary: 163 mg/dL — ABNORMAL HIGH (ref 70–99)
Glucose-Capillary: 139 mg/dL — ABNORMAL HIGH (ref 70–99)

## 2019-09-17 NOTE — Significant Event (Signed)
Patient's left radial pulse 2+-strong. Patient's right pulse unable to feel- required doppler. Per patient's nurse this is a change from assessment this morning. Both hands warm, dry and pink. Patient alert x3 but remains drowsy and slow to converse. MD Lovorn notified of findings. Cardiology was consulted. No additional orders at this time. Continue to monitor

## 2019-09-17 NOTE — Significant Event (Signed)
Rapid Response Event Note  Overview: Time Called: [id number] Arrival Time: [id number] Event Type: Hypotension  Initial Focused Assessment: RN called because BP 39/27   Patient is a little sleepy but awakes and answers questions, oriented. Upon my arrival she is lying in trendelenburg. Doppler SBP 40  Interventions: Right FA PIV placed.  250 cc NS bolus Repeat doppler SBP 78 Automatic cuff BP on Right arm: 80/40                                     Left arm: 163/63                                     Right Leg: 138/44  MD notified initially about the low BP Then notified regarding the difference in BP on the right and the left arm. Prior right arm bp 135/55, 167/53, 150/50  Per RN assessment this am her right and left radial pulse is 2+ Now her right radial pulse is doppler                Left radial is 2+ Both hands/arms are warm and pink Assessment reported to MD.    Plan of Care (if not transferred): MD called consult.  Event Summary: Name of Physician Notified: Dr Becky Sax at 1435    at       Event End Time: [place]  Raliegh Ip

## 2019-09-17 NOTE — Progress Notes (Signed)
Physical Therapy Session Note  Patient Details  Name: [name] [name] MRN: [id number] Date of Birth: Jan 23, 1951  Today's Date: 09/17/2019 PT Individual Time: [id number] PT Individual Time Calculation (min): 55 min   Short Term Goals: Week 1:  PT Short Term Goal 1 (Week 1): Pt will initiate gait training PT Short Term Goal 2 (Week 1): Pt will perform bed mobility w/ mod assist PT Short Term Goal 3 (Week 1): Pt will perform bed<>chair transfer w/ max assist consistently PT Short Term Goal 4 (Week 1): Pt will attend to L side of body during functional mobility w/ min cues  Skilled Therapeutic Interventions/Progress Updates:  Pt received in bed, lying sideways with brief coming off & pt unaware she had hit call button & stating she didn't need anything. Brief clean but therapist provided dependent assist for donning fresh one. Pt requires total assist for rolling L<>R as pt constantly attempts to flex R hip/knee to push to roll L despite cuing for rolling R & pt requires total assist for scooting L<>R in bed 2/2 significantly impaired ability to coordinate movement & follow instructions. Provided total assist for donning pants & non skid socks. Pt requires max assist +2 for supine>sitting EOB but pt able to transfer sit<>stand with mod assist +2 for safety with stedy; transferred pt to TIS w/c with use of stedy. While in w/c therapist provided set up assist for breakfast tray, placing all items midline or L of midline. Pt consumed applesauce which was sitting directly in front of her but requires max cuing to attend to items to L of midline but once she turns her head/eyes to L pt is able to locate items with less cuing. Pt set up at sink & located brush on L side of sink with min cuing, pt brushes hair with RUE with supervision & pt washed face in same manner. Pt left in TIS w/c with chair alarm donned, call bell in lap.  LLE plantarflexed & therapist attempts to perform PROM but unable to move joint  while in supine but once pt in w/c with hip/knee flexed pt's L ankle able to come to more neutral position.  Provided pt with LUE arm trough for use in w/c.   Therapy Documentation Precautions:  Precautions Precautions: Fall Precaution Comments: recent L humeral fx, no orders Restrictions Weight Bearing Restrictions: No  Pain: Pt c/o 5/10 pain in LUE with pain limiting ability to roll to L side in bed - repositioning & rest breaks provided & pain meds requested from nurse, MD also made aware.  Therapy/Group: Individual Therapy  Waunita Schooner 09/17/2019, 9:35 AM

## 2019-09-17 NOTE — Progress Notes (Signed)
Occupational Therapy Session Note  Patient Details  Name: [name] [name] MRN: [id number] Date of Birth: Jul 22, 1951  Today's Date: 09/17/2019 OT Individual Time: 1045-1200 OT Individual Time Calculation (min): 75 min    Short Term Goals: Week 1:  OT Short Term Goal 1 (Week 1): Pt will complete sit<>stand with consistent mod A in preparation for BADL tasks OT Short Term Goal 2 (Week 1): Pt will locate 2 grooming items on L side of the sink with min questioing cues OT Short Term Goal 3 (Week 1): Pt will complete 1 step of UB dressing task without verbal cues  Skilled Therapeutic Interventions/Progress Updates:    Pt received sitting up in the TIS w/c, reporting fatigue but no pain. Pt agreeable to ADL session. Pt completed grooming tasks at the sink seated with cueing required for motor planning and termination of task. Pt completed UB bathing with min A for reaching under RUE, good awareness of LUE and thoroughness of bathing. Pt able to verbalize hemi dressing technique but then still initiating dressing R side first. Pt required mod manual facilitation to correct dressing apraxia. Once initiated, pt was able to carry through motor plan, mod A overall. Pt required max A to don pants overall. Pt able to stand with max A with use of sink for UE support and blocking of the LLE. Pt fatigued and required increased rest breaks throughout session. Pt completed blocked practice of sit <> stands in the stedy with min-mod A overall. Cueing provided for midline orientation and upright posture. Pt was transferred back to bed and saebo was applied to her LUE for 20 min, unattended. Bed alarm set.   Saebo Stim One: No adverse skin reaction or pain reported.  330 pulse width 35 Hz pulse rate On 8 sec/ off 8 sec Ramp up/ down 2 sec Symmetrical Biphasic wave form  Max intensity 138mA at 500 Ohm load   Therapy Documentation Precautions:  Precautions Precautions: Fall Precaution Comments: recent L humeral  fx, no orders Restrictions Weight Bearing Restrictions: No   Therapy/Group: Individual Therapy  RAMINA SACCHETTI 09/17/2019, 7:23 AM

## 2019-09-17 NOTE — Progress Notes (Signed)
[place] PHYSICAL MEDICINE & REHABILITATION PROGRESS NOTE   Subjective/Complaints:   Pt reports her LUE hurts when they use cera steady- however otherwise, denies any issues- no back pain this AM.     ROS: Patient denies fever, rash, sore throat, blurred vision, nausea, vomiting, diarrhea, cough, shortness of breath or chest pain, joint or back pain, headache, or mood change.   Objective:   No results found. Recent Labs    09/15/19 0537  WBC 9.5  HGB 12.6  HCT 37.6  PLT 313   Recent Labs    09/15/19 0537  NA 136  K 3.3*  CL 103  CO2 24  GLUCOSE 180*  BUN 11  CREATININE 0.70  CALCIUM 9.2    Intake/Output Summary (Last 24 hours) at 09/17/2019 1313 Last data filed at 09/17/2019 0745 Gross per 24 hour  Intake 280 ml  Output --  Net 280 ml     Physical Exam: Vital Signs Blood pressure 90/60, pulse 77, temperature 98.1 F (36.7 C), resp. rate 19, height 5\' 4"  (1.626 m), weight 91 kg, SpO2 94 %. Constitutional: No distress . Vital signs reviewed. Working with PT on steady transfer- needs 2 people to get on it/assistance required, NAD HEENT: EOMI, oral membranes moist Neck: supple Cardiovascular: RRR without murmur. No JVD    Respiratory: CTA Bilaterally without wheezes or rales. Normal effort    GI: BS +, non-tender, non-distended  Musculoskeletal:     Comments: no edema, left heel cord a little tight Neurological: She is alert and O x 3. No focal CN abnl. Left inattention.  Motor: RUE/RLE: 5/5 proximal distal LUE: Shoulder abduction, elbow flexion/extension tr-1/5, handgrip 3-4/5 but engages inconsistently- not able to grip steady this AM without a  Lot of assistance LLE: Hip flexion tr-1/5, distally 0/5 Sensation diminished to touch bilateral plantar feet and LLE, minimal sensory loss LUE. No resting tone  Skin: Skin is warm and dry.  Psychiatric: pleasant, delayed    Assessment/Plan: 1. Functional deficits secondary to bicortical infarcts, Right frontal  SAH which require 3+ hours per day of interdisciplinary therapy in a comprehensive inpatient rehab setting.  Physiatrist is providing close team supervision and 24 hour management of active medical problems listed below.  Physiatrist and rehab team continue to assess barriers to discharge/monitor patient progress toward functional and medical goals  Care Tool:  Bathing  Bathing activity did not occur: Refused           Bathing assist       Upper Body Dressing/Undressing Upper body dressing   What is the patient wearing?: Pull over shirt    Upper body assist Assist Level: Maximal Assistance - Patient 25 - 49%    Lower Body Dressing/Undressing Lower body dressing      What is the patient wearing?: Pants, Incontinence brief     Lower body assist Assist for lower body dressing: Total Assistance - Patient < 25%     Toileting Toileting    Toileting assist Assist for toileting: Maximal Assistance - Patient 25 - 49%     Transfers Chair/bed transfer  Transfers assist     Chair/bed transfer assist level: Dependent - mechanical lift     Locomotion Ambulation   Ambulation assist   Ambulation activity did not occur: Safety/medical concerns          Walk 10 feet activity   Assist  Walk 10 feet activity did not occur: Safety/medical concerns        Walk 50 feet activity  Assist Walk 50 feet with 2 turns activity did not occur: Safety/medical concerns         Walk 150 feet activity   Assist Walk 150 feet activity did not occur: Safety/medical concerns         Walk 10 feet on uneven surface  activity   Assist Walk 10 feet on uneven surfaces activity did not occur: Safety/medical concerns         Wheelchair     Assist Will patient use wheelchair at discharge?: No(LT goals not set )             Wheelchair 50 feet with 2 turns activity    Assist            Wheelchair 150 feet activity     Assist           Blood pressure 90/60, pulse 77, temperature 98.1 F (36.7 C), resp. rate 19, height 5\' 4"  (1.626 m), weight 91 kg, SpO2 94 %.  Medical Problem List and Plan: 1.  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 Patient with bilateral cerebral and right occipital infarcts last week as well as pulmonary emboli with DVT.             -patient may shower             -ELOS/Goals: 17-20 days/Min A             -Patient continue CIR therapies including PT, OT, and SLP   -PRAFO LLE 2.  Antithrombotics: -DVT/anticoagulation: Eliquis             -antiplatelet therapy: N/A 3. Pain Management: Lidoderm patch change as directed, hydrocodone as needed 4. Mood: Valium as needed for anxiety and depression             -antipsychotic agents: N/A 5. Neuropsych: This patient is capable of making decisions on her own behalf. 6. Skin/Wound Care: Routine skin checks 7. Fluids/Electrolytes/Nutrition: encourage PO  -I personally reviewed the patient's labs today.     -K+ 3.3---repleting   2/5 recheck K+ monday 8.  PFO.  Follow cardiology services.  TEE completed, suggesting PFO, await further Cards recs.. 9.  Hypertension.  Cardizem 120 mg daily, Lasix 20 mg daily, Cozaar 50 mg daily            2/5  SBP elevated again this morning but was having some back discomfort  -follow for now as BP's have been inconsistent  --avoid hypotension 10.  Hyperlipidemia: Continue Lipitor 11.  Diabetes mellitus with hyperglycemia.  Hemoglobin A1c 9.4.  SSI.  Patient on Glucophage 1000 mg twice daily prior to admission.  Resume as needed            2/5 cbg's with intermittent elevation. Resume metformin at 250mg  bid  2/6- BGs 150s-206- will con't metformin since only been 24 hours- will monitor 12.  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  Continue nebulizer treatments.     -tolerating therapies thus far 13.   CKD stage II.                2/4 BUN/Cr 11/0.7 14.  Restless leg syndrome.  Requip 2 mg 3 times daily    LOS: 3 days A FACE TO FACE EVALUATION WAS PERFORMED  [name] [name] 09/17/2019, 1:13 PM

## 2019-09-17 NOTE — Progress Notes (Signed)
Speech Language Pathology Daily Session Note  Patient Details  Name: [name] [name] MRN: [id number] Date of Birth: 02-25-1951  Today's Date: 09/17/2019 SLP Individual Time: 1347-1410 SLP Individual Time Calculation (min): 23 min  Short Term Goals: Week 1: SLP Short Term Goal 1 (Week 1): Pt will sustain attention to tasks for 5 minute intervals with Mod A cues for redirection. SLP Short Term Goal 2 (Week 1): Pt will recall new and/or complex daily information with Mod A verbal/visual cues for use of compensatory strategies. SLP Short Term Goal 3 (Week 1): Pt will demonstrate problem solving during basic to mildly complex tasks with Mod A verbal/visual cues. SLP Short Term Goal 4 (Week 1): Pt will demonstrate emergent awareness by detecting errors during functional tasks with Mod A verbal/visual cues.  Skilled Therapeutic Interventions:  Pt was seen for skilled ST targeting cognitive goals.  Pt was awake but lethargic upon therapist's arrival.  Pt was laying on her side with one foot hanging off the bed and she reported feeling uncomfortable with that position.  SLP repositioned pt to sitting upright with left upper and lower extremities supported with pillows to maximize pt comfort and alertness for participation in treatment session.  SLP attempted a grocery planning task with pt today; however, pt needed total assist for any task initiation due to lethargy.  Therefore, task was abandoned in favor of a much simpler symbol cancellation task to address attention to task and visual scanning.  Pt needed between max and total assist to locate 10/10 symbols within task.  Given lethargy with minimal response to attempts to arouse pt, session was ended early.  Pt was left in bed with bed alarm set and call bell within reach.  Continue per current plan of care.    Pain Pain Assessment Pain Scale: 0-10 Pain Score: 0-No pain  Therapy/Group: Individual Therapy  Pookela Sellin, Selinda Orion 09/17/2019, 2:16 PM

## 2019-09-18 LAB — GLUCOSE, CAPILLARY
Glucose-Capillary: 139 mg/dL — ABNORMAL HIGH (ref 70–99)
Glucose-Capillary: 165 mg/dL — ABNORMAL HIGH (ref 70–99)
Glucose-Capillary: 129 mg/dL — ABNORMAL HIGH (ref 70–99)
Glucose-Capillary: 186 mg/dL — ABNORMAL HIGH (ref 70–99)

## 2019-09-18 MED ORDER — METFORMIN HCL 500 MG PO TABS
500.0000 mg | ORAL_TABLET | Freq: Two times a day (BID) | ORAL | Status: DC
  Administered 2019-09-18 – 2019-10-07 (×38): 500 mg via ORAL
  Filled 2019-09-18 (×39): qty 1

## 2019-09-18 NOTE — Progress Notes (Addendum)
Patient slept mostly throughout the night. Pt had large BM around 2330. Pt had her right hand in her stool and touched her hair with it and touched the side rails with it. NT and RN assisted pt into Stedy to take shower and changed pt linen. After that, pt slept for the rest of the night. Left Prafo boot in place during night. No signs of distress at this time, call light in reach.

## 2019-09-18 NOTE — Progress Notes (Signed)
[place] PHYSICAL MEDICINE & REHABILITATION PROGRESS NOTE   Subjective/Complaints:   Pt reports she feels good; denies any specific issues, but does report "moving the same"; feeling OK- although some staff are concerned she's not acting the same.   She was interactive with me; although woke from deep sleep when I entered room.   Per note, had large stool last night after rapid response yesterday afternoon and slept well; after being cleaned up.     ROS: Patient denies fever, rash, sore throat, blurred vision, nausea, vomiting, diarrhea, cough, shortness of breath or chest pain, joint or back pain, headache, or mood change.   Objective:   No results found. No results for input(s): WBC, HGB, HCT, PLT in the last 72 hours. No results for input(s): NA, K, CL, CO2, GLUCOSE, BUN, CREATININE, CALCIUM in the last 72 hours.  Intake/Output Summary (Last 24 hours) at 09/18/2019 1229 Last data filed at 09/18/2019 0726 Gross per 24 hour  Intake 600 ml  Output --  Net 600 ml     Physical Exam: Vital Signs Blood pressure (!) 174/69, pulse 83, temperature 97.9 F (36.6 C), resp. rate 20, height 5\' 4"  (1.626 m), weight 91 kg, SpO2 96 %. Constitutional: No distress . Vital signs reviewed. Working with PT on steady transfer- needs 2 people to get on it/assistance required, NAD HEENT: EOMI, oral membranes moist Neck: supple Cardiovascular: RRR without murmur. No JVD    Respiratory: CTA Bilaterally without wheezes or rales. Normal effort    GI: BS +, non-tender, non-distended  Musculoskeletal:     Comments: no edema, left heel cord a little tight Neurological: She is alert and O x 3. No focal CN abnl. Left inattention.  Motor: RUE/RLE: 5/5 proximal distal LUE: Shoulder abduction, elbow flexion/extension tr-1/5, handgrip 3-4/5 but engages inconsistently- not able to grip steady this AM without a  Lot of assistance LLE: Hip flexion tr-1/5, distally 0/5 Sensation diminished to touch bilateral  plantar feet and LLE, minimal sensory loss LUE. No resting tone  Skin: Skin is warm and dry.  Psychiatric: pleasant, delayed    Assessment/Plan: 1. Functional deficits secondary to bicortical infarcts, Right frontal SAH which require 3+ hours per day of interdisciplinary therapy in a comprehensive inpatient rehab setting.  Physiatrist is providing close team supervision and 24 hour management of active medical problems listed below.  Physiatrist and rehab team continue to assess barriers to discharge/monitor patient progress toward functional and medical goals  Care Tool:  Bathing  Bathing activity did not occur: Refused           Bathing assist       Upper Body Dressing/Undressing Upper body dressing   What is the patient wearing?: Pull over shirt    Upper body assist Assist Level: Maximal Assistance - Patient 25 - 49%    Lower Body Dressing/Undressing Lower body dressing      What is the patient wearing?: Pants, Incontinence brief     Lower body assist Assist for lower body dressing: Total Assistance - Patient < 25%     Toileting Toileting    Toileting assist Assist for toileting: Maximal Assistance - Patient 25 - 49%     Transfers Chair/bed transfer  Transfers assist     Chair/bed transfer assist level: Dependent - mechanical lift     Locomotion Ambulation   Ambulation assist   Ambulation activity did not occur: Safety/medical concerns          Walk 10 feet activity   Assist  Walk 10 feet activity did not occur: Safety/medical concerns        Walk 50 feet activity   Assist Walk 50 feet with 2 turns activity did not occur: Safety/medical concerns         Walk 150 feet activity   Assist Walk 150 feet activity did not occur: Safety/medical concerns         Walk 10 feet on uneven surface  activity   Assist Walk 10 feet on uneven surfaces activity did not occur: Safety/medical concerns          Wheelchair     Assist Will patient use wheelchair at discharge?: No(LT goals not set )             Wheelchair 50 feet with 2 turns activity    Assist            Wheelchair 150 feet activity     Assist          Blood pressure (!) 174/69, pulse 83, temperature 97.9 F (36.6 C), resp. rate 20, height 5\' 4"  (1.626 m), weight 91 kg, SpO2 96 %.  Medical Problem List and Plan: 1.  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 Patient with bilateral cerebral and right occipital infarcts last week as well as pulmonary emboli with DVT.             -patient may shower             -ELOS/Goals: 17-20 days/Min A             -Patient continue CIR therapies including PT, OT, and SLP   -PRAFO LLE  2/7- Was called that pt "not acting like herself" noted that energy level a little less than when seen yesterday, but answering questions appropriately and moving R side the same as well as left- which has been affected by CVA/SAH.  2.  Antithrombotics: -DVT/anticoagulation: Eliquis             -antiplatelet therapy: N/A 3. Pain Management: Lidoderm patch change as directed, hydrocodone as needed 4. Mood: Valium as needed for anxiety and depression             -antipsychotic agents: N/A 5. Neuropsych: This patient is capable of making decisions on her own behalf. 6. Skin/Wound Care: Routine skin checks 7. Fluids/Electrolytes/Nutrition: encourage PO  -I personally reviewed the patient's labs today.     -K+ 3.3---repleting   2/5 recheck K+ monday 8.  PFO.  Follow cardiology services.  TEE completed, suggesting PFO, await further Cards recs.. 9.  Hypertension.  Cardizem 120 mg daily, Lasix 20 mg daily, Cozaar 50 mg daily            2/5  SBP elevated again this morning but was having some back discomfort  -follow for now as BP's have been inconsistent  --avoid hypotension 10.  Hyperlipidemia: Continue  Lipitor 11.  Diabetes mellitus with hyperglycemia.  Hemoglobin A1c 9.4.  SSI.  Patient on Glucophage 1000 mg twice daily prior to admission.  Resume as needed            2/5 cbg's with intermittent elevation. Resume metformin at 250mg  bid  2/6- BGs 150s-206- will con't metformin since only been 24 hours- will monitor  2/7- BGs 130s-170s- will increase Metformin to 500 mg BID 12.  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  Continue nebulizer treatments.     -tolerating therapies thus  far 13.  CKD stage II.                2/4 BUN/Cr 11/0.7 14.  Restless leg syndrome.  Requip 2 mg 3 times daily 15. BP difference between arms-  2/7- consulted Cardiology- he felt there were no interventions but have them take BP in LUE from now on- order placed.     LOS: 4 days A FACE TO FACE EVALUATION WAS PERFORMED  Alin Hutchins 09/18/2019, 12:29 PM

## 2019-09-19 ENCOUNTER — Inpatient Hospital Stay (HOSPITAL_COMMUNITY): Payer: Medicare Other

## 2019-09-19 ENCOUNTER — Inpatient Hospital Stay (HOSPITAL_COMMUNITY): Payer: Medicare Other | Admitting: Physical Therapy

## 2019-09-19 ENCOUNTER — Inpatient Hospital Stay (HOSPITAL_COMMUNITY): Payer: Medicare Other | Admitting: Speech Pathology

## 2019-09-19 LAB — BASIC METABOLIC PANEL
Anion gap: 9 (ref 5–15)
BUN: 8 mg/dL (ref 8–23)
CO2: 27 mmol/L (ref 22–32)
Calcium: 9.6 mg/dL (ref 8.9–10.3)
Chloride: 102 mmol/L (ref 98–111)
Creatinine, Ser: 0.64 mg/dL (ref 0.44–1.00)
GFR calc Af Amer: >60 mL/min (ref >60)
GFR calc non Af Amer: >60 mL/min (ref >60)
Glucose, Bld: 168 mg/dL — ABNORMAL HIGH (ref 70–99)
Potassium: 3.6 mmol/L (ref 3.5–5.1)
Sodium: 138 mmol/L (ref 135–145)

## 2019-09-19 LAB — URINALYSIS, COMPLETE (UACMP) WITH MICROSCOPIC
Bacteria, UA: NONE SEEN
Bilirubin Urine: NEGATIVE
Glucose, UA: NEGATIVE mg/dL
Hgb urine dipstick: NEGATIVE
Ketones, ur: NEGATIVE mg/dL
Leukocytes,Ua: NEGATIVE
Nitrite: NEGATIVE
Protein, ur: NEGATIVE mg/dL
Specific Gravity, Urine: 1.017 (ref 1.005–1.030)
pH: 6 (ref 5.0–8.0)

## 2019-09-19 LAB — GLUCOSE, CAPILLARY
Glucose-Capillary: 166 mg/dL — ABNORMAL HIGH (ref 70–99)
Glucose-Capillary: 173 mg/dL — ABNORMAL HIGH (ref 70–99)
Glucose-Capillary: 136 mg/dL — ABNORMAL HIGH (ref 70–99)
Glucose-Capillary: 127 mg/dL — ABNORMAL HIGH (ref 70–99)

## 2019-09-19 LAB — CBC WITH DIFFERENTIAL/PLATELET
Abs Immature Granulocytes: 0.08 10*3/uL — ABNORMAL HIGH (ref 0.00–0.07)
Basophils Absolute: 0.1 10*3/uL (ref 0.0–0.1)
Basophils Relative: 1 %
Eosinophils Absolute: 0.2 10*3/uL (ref 0.0–0.5)
Eosinophils Relative: 2 %
HCT: 41.3 % (ref 36.0–46.0)
Hemoglobin: 13.5 g/dL (ref 12.0–15.0)
Immature Granulocytes: 1 %
Lymphocytes Relative: 30 %
Lymphs Abs: 3 10*3/uL (ref 0.7–4.0)
MCH: 28.6 pg (ref 26.0–34.0)
MCHC: 32.7 g/dL (ref 30.0–36.0)
MCV: 87.5 fL (ref 80.0–100.0)
Monocytes Absolute: 0.9 10*3/uL (ref 0.1–1.0)
Monocytes Relative: 9 %
Neutro Abs: 5.9 10*3/uL (ref 1.7–7.7)
Neutrophils Relative %: 57 %
Platelets: 373 10*3/uL (ref 150–400)
RBC: 4.72 MIL/uL (ref 3.87–5.11)
RDW: 15.4 % (ref 11.5–15.5)
WBC: 10.1 10*3/uL (ref 4.0–10.5)
nRBC: 0 % (ref 0.0–0.2)

## 2019-09-19 LAB — FACTOR 5 LEIDEN

## 2019-09-19 NOTE — Progress Notes (Signed)
Physical Therapy Session Note  Patient Details  Name: [name] [name] MRN: [id number] Date of Birth: 07/23/1951  Today's Date: 09/19/2019 PT Individual Time: [id number] PT Individual Time Calculation (min): 55 min   Short Term Goals: Week 1:  PT Short Term Goal 1 (Week 1): Pt will initiate gait training PT Short Term Goal 2 (Week 1): Pt will perform bed mobility w/ mod assist PT Short Term Goal 3 (Week 1): Pt will perform bed<>chair transfer w/ max assist consistently PT Short Term Goal 4 (Week 1): Pt will attend to L side of body during functional mobility w/ min cues  Skilled Therapeutic Interventions/Progress Updates:   Pt in TIS, agreeable to therapy and no c/o pain. Pt had not eaten any of her lunch. Set-up w/ tray in front of her. Overall needed mod-max cues to self-feed w/ RUE. Verbal, tactile, and visual cues for sequencing, initiation, termination, attention to task, and for L environmental awareness. Pt at times would attempt to pick up non-food items w/ spoon, when told that wasn't food she just stated "ok". Pt intermittently closing eyes, reports being tired, min cues to stay awake. After ~50% of meal, pt declined any further 2/2 fullness. Total assist w/c transport to/from therapy gym. Attempted to work on sit<>stands at rail, however pt unable to motor plan leaning forward off back of chair and placing BLEs on ground. Provided w/ various verbal and tactile cues in attempt to trigger correct motor plan, however pt appeared to go into increased extensor tone w/ attempts at mobility, her hips began to slide forward in chair and RLE went into extension. +2 assist needed to safely scoot back in chair. Pt able to verbalize that she was having trouble w/ task, but unable to fix. Required multiple attempts to stand, but able to do so w/ total assist +2, unable to reach full upright however. No increase in tone felt in RLE, but LLE increase in tone felt through entire knee ROM at a constant  velocity. This did not appear as "true" tone to therapist, but will continue to monitor. Re-attempted sit>stand w/ stedy as she has used this previously today and more successful, able to stand w/ total assist. Once in standing, pt pushing to L side, unable to reach full upright and midline despite max cues from therapist. Returned to seated. Pt w/ increased difficulty remaining awake as session progressed, mod cues to attend to task. Added half lap tray to L side of w/c for improved upright posture and LUE support. Pt endorsed decreased L shoulder discomfort w/ this. Returned to room and performed stedy transfer to EOB, needed total assist to boost up and unable to reach full upright 2/2 L pushing. Therapist was able to move flaps into place on stedy however. Sit>supine w/ total assist. Ended session in supine, all needs in reach. Missed 20 min of skilled PT 2/2 fatigue.   Note: Pt appears worse functionally today compared to last session w/ this therapist. Last session, pt was able to safely perform stand/squat pivot w/ therapist's assist, however this therapist would NOT feel safe attempting that transfer today. Pt also w/ slower processing and initiation today. Made MD aware of status.   Therapy Documentation Precautions:  Precautions Precautions: Fall Precaution Comments: recent L humeral fx, no orders Restrictions Weight Bearing Restrictions: No Vital Signs: Therapy Vitals Temp: 97.9 F (36.6 C) Pulse Rate: 86 Resp: 18 BP: (!) 136/53 Patient Position (if appropriate): Sitting Oxygen Therapy SpO2: 96 % O2 Device: Room Air  Therapy/Group: Individual Therapy  [name] [name] 09/19/2019, 3:45 PM

## 2019-09-19 NOTE — Progress Notes (Signed)
[place] PHYSICAL MEDICINE & REHABILITATION PROGRESS NOTE   Subjective/Complaints:   No new issues. On bed pan emptying bowels. OT about to work with her.   ROS: Patient denies fever, rash, sore throat, blurred vision, nausea, vomiting, diarrhea, cough, shortness of breath or chest pain, joint or back pain, headache, or mood change.   Objective:   No results found. Recent Labs    09/19/19 0508  WBC 10.1  HGB 13.5  HCT 41.3  PLT 373   Recent Labs    09/19/19 0508  NA 138  K 3.6  CL 102  CO2 27  GLUCOSE 168*  BUN 8  CREATININE 0.64  CALCIUM 9.6    Intake/Output Summary (Last 24 hours) at 09/19/2019 1018 Last data filed at 09/19/2019 0900 Gross per 24 hour  Intake 600 ml  Output --  Net 600 ml     Physical Exam: Vital Signs Blood pressure (!) 161/87, pulse 80, temperature 99.2 F (37.3 C), temperature source Oral, resp. rate 18, height 5\' 4"  (1.626 m), weight 91 kg, SpO2 95 %. Constitutional: No distress . Vital signs reviewed. HEENT: EOMI, oral membranes moist Neck: supple Cardiovascular: RRR without murmur. No JVD    Respiratory: CTA Bilaterally without wheezes or rales. Normal effort    GI: BS +, non-tender, non-distended  Musculoskeletal:     Comments: no edema, left heel cord remains a little tight Neurological: She is alert and O x 3. No focal CN abnl. Left inattention.  Motor: RUE/RLE: 5/5 proximal distal LUE: Shoulder abduction, elbow flexion/extension tr-1/5, handgrip 3-4/5 but engages inconsistently- engages with tactile cues.  LLE: Hip flexion tr-1/5, distally 0/5 Sensation diminished to touch bilateral plantar feet and LLE, minimal sensory loss LUE. No resting tone  Skin: Skin is warm and dry.  Psychiatric: pleasant, delayed    Assessment/Plan: 1. Functional deficits secondary to bicortical infarcts, Right frontal SAH which require 3+ hours per day of interdisciplinary therapy in a comprehensive inpatient rehab setting.  Physiatrist is  providing close team supervision and 24 hour management of active medical problems listed below.  Physiatrist and rehab team continue to assess barriers to discharge/monitor patient progress toward functional and medical goals  Care Tool:  Bathing  Bathing activity did not occur: Refused           Bathing assist       Upper Body Dressing/Undressing Upper body dressing   What is the patient wearing?: Hospital gown only    Upper body assist Assist Level: Maximal Assistance - Patient 25 - 49%    Lower Body Dressing/Undressing Lower body dressing      What is the patient wearing?: Pants, Incontinence brief     Lower body assist Assist for lower body dressing: Total Assistance - Patient < 25%     Toileting Toileting    Toileting assist Assist for toileting: Total Assistance - Patient < 25%     Transfers Chair/bed transfer  Transfers assist     Chair/bed transfer assist level: Dependent - mechanical lift     Locomotion Ambulation   Ambulation assist   Ambulation activity did not occur: Safety/medical concerns          Walk 10 feet activity   Assist  Walk 10 feet activity did not occur: Safety/medical concerns        Walk 50 feet activity   Assist Walk 50 feet with 2 turns activity did not occur: Safety/medical concerns         Walk 150 feet activity  Assist Walk 150 feet activity did not occur: Safety/medical concerns         Walk 10 feet on uneven surface  activity   Assist Walk 10 feet on uneven surfaces activity did not occur: Safety/medical concerns         Wheelchair     Assist Will patient use wheelchair at discharge?: No(LT goals not set )             Wheelchair 50 feet with 2 turns activity    Assist            Wheelchair 150 feet activity     Assist          Blood pressure (!) 161/87, pulse 80, temperature 99.2 F (37.3 C), temperature source Oral, resp. rate 18, height 5\' 4"  (1.626  m), weight 91 kg, SpO2 95 %.  Medical Problem List and Plan: 1.  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 Patient with bilateral cerebral and right occipital infarcts last week as well as pulmonary emboli with DVT.             -patient may shower             -ELOS/Goals: 17-20 days/Min A             -Patient continue CIR therapies including PT, OT, and SLP   -PRAFO LLE--not sure this is being placed each night 2. DVT/anticoagulation: Eliquis             -antiplatelet therapy: N/A 3. Pain Management: Lidoderm patch change as directed, hydrocodone as needed 4. Mood: Valium as needed for anxiety and depression             -antipsychotic agents: N/A 5. Neuropsych: This patient is capable of making decisions on her own behalf. 6. Skin/Wound Care: Routine skin checks 7. Fluids/Electrolytes/Nutrition: encourage PO  -I personally reviewed the patient's labs today.     -K+ 3.3---repleting   2/5 recheck K+ monday 8.  PFO.  Follow cardiology services.  TEE completed, suggesting PFO, await further Cards recs.. 9.  Hypertension.  Cardizem 120 mg daily, Lasix 20 mg daily, Cozaar 50 mg daily            2/8  SBP still a little high  -follow for trend, avoid hypotension 10.  Hyperlipidemia: Continue Lipitor 11.  Diabetes mellitus with hyperglycemia.  Hemoglobin A1c 9.4.  SSI.  Patient on Glucophage 1000 mg twice daily prior to admission.  Resume as needed            2/5 cbg's with intermittent elevation. Resume metformin at 250mg  bid  2/6- BGs 150s-206- will con't metformin since only been 24 hours- will monitor  2/7- BGs 130s-170s- will increase Metformin to 500 mg BID  2/8 follow for trend with recently increased metformin 12.  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  Continue nebulizer treatments.     -tolerating therapies thus far 13.  CKD stage II.                2/4  BUN/Cr 11/0.7 14.  Restless leg syndrome.  Requip 2 mg 3 times daily 15. BP difference between arms?  2/7- consulted Cardiology- he felt there were no interventions but have them take BP in LUE from now on- order placed.     LOS: 5 days A FACE TO FACE EVALUATION WAS PERFORMED  Meredith Staggers 09/19/2019, 10:18 AM

## 2019-09-19 NOTE — Progress Notes (Signed)
Speech Language Pathology Daily Session Note  Patient Details  Name: [name] [name] MRN: [id number] Date of Birth: [birth year]  Today's Date: 09/19/2019 SLP Individual Time: 1000-1055 SLP Individual Time Calculation (min): 55 min  Short Term Goals: Week 1: SLP Short Term Goal 1 (Week 1): Pt will sustain attention to tasks for 5 minute intervals with Mod A cues for redirection. SLP Short Term Goal 2 (Week 1): Pt will recall new and/or complex daily information with Mod A verbal/visual cues for use of compensatory strategies. SLP Short Term Goal 3 (Week 1): Pt will demonstrate problem solving during basic to mildly complex tasks with Mod A verbal/visual cues. SLP Short Term Goal 4 (Week 1): Pt will demonstrate emergent awareness by detecting errors during functional tasks with Mod A verbal/visual cues.  Skilled Therapeutic Interventions: Skilled treatment session focused on cognitive goals. SLP facilitated session by providing Min A verbal cues for recall of her current medications and their functions. However, total A multimodal cues were needed for basic problem solving while organizing a BID pill box. Patient became perseverative on certain steps of task and would answer "ok" when SLP attempted to redirect or cue her but then would continue on with task as she wanted. Patient had awareness of this but was unable to self-monitor or correct. Total A multimodal cues were also needed for anticipatory awareness as she reported she could complete the task independently at discharge although it took 30 minutes to organize 1 pill for the week with assistance. Patient left upright in wheelchair with alarm on and all needs within reach. Continue with current plan of care.      Pain 6/10 headache RN aware  Therapy/Group: Individual Therapy  [name] [name] 09/19/2019, 12:22 PM

## 2019-09-19 NOTE — Progress Notes (Signed)
Occupational Therapy Session Note  Patient Details  Name: [name] [name] MRN: [id number] Date of Birth: 10/07/1950  Today's Date: 09/19/2019 OT Individual Time: 0830-0930 OT Individual Time Calculation (min): 60 min    Short Term Goals: Week 1:  OT Short Term Goal 1 (Week 1): Pt will complete sit<>stand with consistent mod A in preparation for BADL tasks OT Short Term Goal 2 (Week 1): Pt will locate 2 grooming items on L side of the sink with min questioing cues OT Short Term Goal 3 (Week 1): Pt will complete 1 step of UB dressing task without verbal cues  Skilled Therapeutic Interventions/Progress Updates:    Pt received supine, weight shifting off bottom and reporting need for BM. Initiated bed mobility to EOB but pt requiring total A and with heavy posterior lean sitting EOB. 2/2 urgency of BM, returned pt to supine and pt rolled for bedpan placement. Pt with small amount of BM incontinence already, further voiding loose stool.  Pt rolled toward the R with min A, significant motor planning deficits with following commands this session- more notable than saturday's session. Pt able to complete posterior peri hygiene with min A overall with verbal and tactile cueing provided. Pt rolled to the L with max-total A. Pt reporting pain in her L shoulder with any mobility. +2 assistance acquired for transfer. Pt required max +2 to transition to EOB and mod A for sitting balance. Pt used stedy to complete sit > stand from elevated EOB with mod +2 assist. Pt transferred to TIS w/c. Pt completed UB bathing at the sink with mod A overall, mod cueing for sequencing. Mod A to don shirt. Pt left sitting up with all needs met chair alarm belt activated.   Pt oriented but more confused this session, increased difficulty following commands and with motor planning compared to previous sessions.   Therapy Documentation Precautions:  Precautions Precautions: Fall Precaution Comments: recent L humeral fx, no  orders Restrictions Weight Bearing Restrictions: No   Therapy/Group: Individual Therapy  Braeden H Davis 09/19/2019, 6:57 AM 

## 2019-09-19 NOTE — Plan of Care (Signed)
  Problem: Consults Goal: RH STROKE PATIENT EDUCATION Description: See Patient Education module for education specifics  Outcome: Progressing Goal: Diabetes Guidelines if Diabetic/Glucose > 140 Description: If diabetic or lab glucose is > 140 mg/dl - Initiate Diabetes/Hyperglycemia Guidelines & Document Interventions  Outcome: Progressing   Problem: RH BLADDER ELIMINATION Goal: RH STG MANAGE BLADDER WITH ASSISTANCE Description: STG Manage Bladder With min Assistance Outcome: Progressing   Problem: RH SKIN INTEGRITY Goal: RH STG MAINTAIN SKIN INTEGRITY WITH ASSISTANCE Description: STG Maintain Skin Integrity With min Assistance. Outcome: Progressing Goal: RH STG ABLE TO PERFORM INCISION/WOUND CARE W/ASSISTANCE Description: STG Able To Perform Incision/Wound Care With min Assistance. Outcome: Progressing   Problem: RH SAFETY Goal: RH STG ADHERE TO SAFETY PRECAUTIONS W/ASSISTANCE/DEVICE Description: STG Adhere to Safety Precautions With min Assistance and appropriate assistive Device. Outcome: Progressing   Problem: RH PAIN MANAGEMENT Goal: RH STG PAIN MANAGED AT OR BELOW PT'S PAIN GOAL Description: <3 on a 0-10 pain scale Outcome: Progressing   Problem: RH KNOWLEDGE DEFICIT Goal: RH STG INCREASE KNOWLEDGE OF DIABETES Description: Patient will demonstrate knowledge of diabetes medications, dietary restrictions, and follow up care with the MD at discharge with min assist from the staff. Outcome: Progressing Goal: RH STG INCREASE KNOWLEDGE OF HYPERTENSION Description: Patient will demonstrate knowledge of HTN medications, dietary restrictions, and follow up care with the MD at discharge with min assist from the staff.  Outcome: Progressing Goal: RH STG INCREASE KNOWLEDGE OF DYSPHAGIA/FLUID INTAKE Description: Patient will demonstrate knowledge of dysphagia diets, restrictions, and proper swallowing techniques with min assist from staff. Outcome: Progressing Goal: RH STG INCREASE  KNOWLEGDE OF HYPERLIPIDEMIA Description: Patient will demonstrate knowledge of HLD medications, dietary restrictions, and follow up care with the MD at discharge with min assist from the staff.  Outcome: Progressing Goal: RH STG INCREASE KNOWLEDGE OF STROKE PROPHYLAXIS Description: Patient will demonstrate knowledge of stroke prevention medication and follow up care with the MD at discharge with min assist from staff. Outcome: Progressing   Problem: RH BOWEL ELIMINATION Goal: RH STG MANAGE BOWEL W/MEDICATION W/ASSISTANCE Description: STG Manage Bowel with Medication with Assistance. Outcome: Progressing

## 2019-09-20 ENCOUNTER — Inpatient Hospital Stay (HOSPITAL_COMMUNITY): Payer: Medicare Other

## 2019-09-20 ENCOUNTER — Inpatient Hospital Stay (HOSPITAL_COMMUNITY): Payer: Medicare Other | Admitting: Speech Pathology

## 2019-09-20 ENCOUNTER — Inpatient Hospital Stay (HOSPITAL_COMMUNITY): Payer: Medicare Other | Admitting: Physical Therapy

## 2019-09-20 DIAGNOSIS — R404 Transient alteration of awareness: Secondary | ICD-10-CM

## 2019-09-20 DIAGNOSIS — N182 Chronic kidney disease, stage 2 (mild): Secondary | ICD-10-CM

## 2019-09-20 LAB — GLUCOSE, CAPILLARY
Glucose-Capillary: 151 mg/dL — ABNORMAL HIGH (ref 70–99)
Glucose-Capillary: 142 mg/dL — ABNORMAL HIGH (ref 70–99)
Glucose-Capillary: 120 mg/dL — ABNORMAL HIGH (ref 70–99)
Glucose-Capillary: 124 mg/dL — ABNORMAL HIGH (ref 70–99)

## 2019-09-20 MED ORDER — ROPINIROLE HCL 1 MG PO TABS: 2.0000 mg | ORAL_TABLET | Freq: Every day | ORAL | Status: DC

## 2019-09-20 NOTE — Progress Notes (Signed)
Social Work Patient ID: [name] [name], female   DOB: 1951-05-21, 69 y.o.   MRN: [id number]  Have reviewed team conference with pt's son, [name], who is aware that team has set LOS for 4 weeks and min assist goals overall.  He understands that pt with minimal progress and may need to downgrade goals.  Have, also, asked that MD/ PA follow up with son with medical update pending results of CT.  Son continues to plan for pt to d/c home with family providing 24/7.  Continue to follow.   Rondia Higginbotham, LCSW

## 2019-09-20 NOTE — Patient Care Conference (Signed)
Inpatient RehabilitationTeam Conference and Plan of Care Update Date: 09/20/2019   Time: 10:00 AM    Patient Name: [name] [name]      Medical Record Number: [id number]  Date of Birth: 17-May-1951 Sex: Female         Room/Bed: 4W04C/4W04C-01 Payor Info: Payor: MEDICARE / Plan: MEDICARE PART A AND B / Product Type: *No Product type* /    Admit Date/Time:  09/14/2019  4:51 PM  Primary Diagnosis:  Acute bilat watershed infarction [facility])  Patient Active Problem List   Diagnosis Date Noted  . Acute bilat watershed infarction [facility]) 09/14/2019  . CKD (chronic kidney disease), stage II   . Uncontrolled type 2 diabetes mellitus with hyperglycemia ([place])   . PFO (patent foramen ovale)   . Acute CVA (cerebrovascular accident) ([place]) 09/11/2019  . Subarachnoid hemorrhage ([place]) 09/11/2019  . DVT (deep venous thrombosis) ([place]) 09/11/2019  . Aneurysm, thoracic aortic ([place])   . Embolic stroke ([place])   . Pulmonary embolism ([place])   . Acute cerebrovascular accident (CVA) ([facility]) 08/18/2019  . COPD (chronic obstructive pulmonary disease) ([place])   . COPD exacerbation ([place]) 08/12/2018  . Hyponatremia 08/12/2018  . Acute respiratory failure with hypoxia ([place]) 10/26/2015  . CAP (community acquired pneumonia) 10/26/2015  . Type 2 diabetes mellitus ([place]) 10/26/2015  . Overactive bladder 09/10/2015  . Diabetes mellitus type 2, controlled, without complications ([place])   . Depression   . Insomnia   . GERD (gastroesophageal reflux disease)   . Hypercholesteremia   . Hypertension   . Chronic kidney disease   . Overweight 10/29/2009  . GERD 06/20/2008  . CHEST PAIN 06/20/2008  . CHOLECYSTECTOMY, HX OF 06/20/2008  . HYPERLIPIDEMIA 12/11/2007  . TOBACCO ABUSE 12/11/2007  . DEPRESSION 12/11/2007  . Essential hypertension 12/11/2007  . Coronary atherosclerosis 12/11/2007  . DUODENITIS, WITH HEMORRHAGE 12/11/2007    Expected Discharge Date: Expected Discharge Date: (Estimated LOS 4 weeks)  Team Members  Present: Physician leading conference: Dr. Alger Simons Social Worker Present: Lennart Pall, LCSW Nurse Present: Other (comment)(Olivia Capranica, RN) Case Manager: Karene Fry, RN PT Present: Burnard Bunting, PT OT Present: Laverle Hobby, OT SLP Present: Weston Anna, SLP PPS Coordinator present : Ileana Ladd, Burna Mortimer, SLP     Current Status/Progress Goal Weekly Team Focus  Bowel/Bladder   Pt is incont x2. LBM 09/19/19  Pt will regain control of B/B with normal bowel pattern  Q2h toileting on [facility]   Swallow/Nutrition/ Hydration             ADL's   mod A UB ADLs, max A +2 LB ADLs, max +2 transfers. Poor motor planning. Excellent distal control/movement in LUE, poor shoulder voluntary movement and associated pain  min A overall  L NMR, motor planning, ADL retraining, ADL transfers   Mobility   max-total assist bed mobility, total to +2 assist sit<>stand and stedy transfer, no gait yet  min assist overall, w/c level  OOB tolerance, motor planning, initiation, initiating gait, NMR and balance, postural control   Communication             Safety/Cognition/ Behavioral Observations  Max-Total A  Min A  attention, problem solving, recall and emergent awareness   Pain   Pt denies pain at this time. Pt c/o of 7/10, PRN Norco given and effective.  Pt pain will be below 3  Assess pain Qshift/PRN   Skin   No signs of obvious skin breakdown  Pt will remain free of infection and further breakdown.  Assess skin Qshift/PRN    Rehab Goals Patient on target to meet rehab goals: Yes *See Care Plan and progress notes for long and short-term goals.     Barriers to Discharge  Current Status/Progress Possible Resolutions Date Resolved   Nursing                  PT                    OT                  SLP                SW                Discharge Planning/Teaching Needs:  Home with son and his family able to provide 24/7 support.  Teaching needs TBD   Team Discussion: Watershed  infarct, R frontal SAH, slow to process, early tone, inc bowel, CT scan to follow up, meds adjusted.  RN inc B/B, BM X 2 today, mentation improved.  OT mod UB, max +2 LB and transfers, post lean, poor motor planning, pain L shoulder, goals min A.  PT total bed, tot +2 sit to stand, min A goals.  SLP tot A cognition tasks, will need 24/7 assist.   Revisions to Treatment Plan: N/A     Medical Summary Current Status: bi-cerebral infarcts, right SAH. left hemiparesis and inattention. incontinent, DM Weekly Focus/Goal: AMS work up. improve mentation, reduce neurosedating meds. rx dm  Barriers to Discharge: Medical stability   Possible Resolutions to Barriers: see medical chart   Continued Need for Acute Rehabilitation Level of Care: The patient requires daily medical management by a physician with specialized training in physical medicine and rehabilitation for the following reasons: Direction of a multidisciplinary physical rehabilitation program to maximize functional independence : Yes Medical management of patient stability for increased activity during participation in an intensive rehabilitation regime.: Yes Analysis of laboratory values and/or radiology reports with any subsequent need for medication adjustment and/or medical intervention. : Yes   I attest that I was present, lead the team conference, and concur with the assessment and plan of the team.   Jodell Cipro M 09/20/2019, 4:28 PM   Team conference was held via web/ teleconference due to [place] - 19

## 2019-09-20 NOTE — Progress Notes (Signed)
Speech Language Pathology Daily Session Note  Patient Details  Name: TRINA DEATON MRN: [id number] Date of Birth: [birth year]  Today's Date: 09/20/2019 SLP Individual Time: 1300-1325 SLP Individual Time Calculation (min): 25 min  Short Term Goals: Week 1: SLP Short Term Goal 1 (Week 1): Pt will sustain attention to tasks for 5 minute intervals with Mod A cues for redirection. SLP Short Term Goal 2 (Week 1): Pt will recall new and/or complex daily information with Mod A verbal/visual cues for use of compensatory strategies. SLP Short Term Goal 3 (Week 1): Pt will demonstrate problem solving during basic to mildly complex tasks with Mod A verbal/visual cues. SLP Short Term Goal 4 (Week 1): Pt will demonstrate emergent awareness by detecting errors during functional tasks with Mod A verbal/visual cues.  Skilled Therapeutic Interventions: Skilled treatment session focused on cognitive goals. SLP facilitated session by providing Max A verbal cues for visual scanning to the left field of environment during a basic calendar making task. Patient's ability to attend to the left improved with a smaller visual field (from a field of 2) requiring overall Min verbal cues. Also suspect patient's function with calendar task was impacted by restlessness and decreased sustained attention (~60 seconds). Patient left upright in tilt-in-space wheelchair with alarm on and all needs within reach. Continue with current plan of care.        Pain No/Denies Pain   Therapy/Group: Individual Therapy  Thoams Siefert 09/20/2019, 4:01 PM

## 2019-09-20 NOTE — Progress Notes (Signed)
Occupational Therapy Session Note  Patient Details  Name: [name] [name] MRN: [id number] Date of Birth: 04-16-1951  Today's Date: 09/20/2019 OT Individual Time: 9791-5041 OT Individual Time Calculation (min): 57 min   Session 2: OT Individual Time: 3643-8377 OT Individual Time Calculation (min): 30 min    Short Term Goals: Week 1:  OT Short Term Goal 1 (Week 1): Pt will complete sit<>stand with consistent mod A in preparation for BADL tasks OT Short Term Goal 2 (Week 1): Pt will locate 2 grooming items on L side of the sink with min questioing cues OT Short Term Goal 3 (Week 1): Pt will complete 1 step of UB dressing task without verbal cues  Skilled Therapeutic Interventions/Progress Updates:    Pt received supine with stool incontinence, NT present assisting. Pt completed rolling to the R with CGA, L with max- total A. Total A for peri hygiene at bed level. Pants donned with max A, with pt making efforts to bridge with RLE and to pull up with RUE. Pt completed transfer to EOB with max +2 assist. Moderate posterior lean present EOB. Charlaine Dalton was used to transfer pt into perched seated. Pt completed UB bathing at the sink in perched sitting with mod cueing required for midline orientation. Fluctuating CGA-min A required for sitting balance. Pt was transferred into TIS w/c with mod +2 to stand, diffiuclty tucking pelvis to achieve full stand. Pt demonstrated ability to verbally plan functional activities but still with severe impairments in executing any motor plan. Pt required HOH to put toothpaste on toothbrush and then was able to brush teeth with cueing needed for termination/sequencing. Shirt donned mod-max A overall. Pt was left sitting up in the TIS with all needs within reach, chair alarm belt fastened.   Session 2: Pt received sitting up in the TIS w/c with no c/o pain, R lateral lean. Pt was taken to the therapy gym via w/c. Pt stood with the stedy with mod +2 assist, heavy assist needed  to fully tuck pelvis to bring down flaps to sit. Pt sat EOM and required intermittent cueing/min A to maintain midline orientation, leaning to the L. Pt completed functional reaching with the R UE with clothespins to address motor planning and initiation/termination of tasks. Pt required MAX cueing to terminate any motor plan this session. Pt returned to TIS w/c and was left sitting up in the room with all needs met. Chair alarm set.   Therapy Documentation Precautions:  Precautions Precautions: Fall Precaution Comments: recent L humeral fx, no orders Restrictions Weight Bearing Restrictions: No  Therapy/Group: Individual Therapy  NEHEMIE CASSERLY 09/20/2019, 6:47 AM

## 2019-09-20 NOTE — Plan of Care (Signed)
  Problem: Consults Goal: RH STROKE PATIENT EDUCATION Description: See Patient Education module for education specifics  Outcome: Progressing Goal: Diabetes Guidelines if Diabetic/Glucose > 140 Description: If diabetic or lab glucose is > 140 mg/dl - Initiate Diabetes/Hyperglycemia Guidelines & Document Interventions  Outcome: Progressing   Problem: RH BLADDER ELIMINATION Goal: RH STG MANAGE BLADDER WITH ASSISTANCE Description: STG Manage Bladder With min Assistance Outcome: Progressing   Problem: RH SKIN INTEGRITY Goal: RH STG MAINTAIN SKIN INTEGRITY WITH ASSISTANCE Description: STG Maintain Skin Integrity With min Assistance. Outcome: Progressing Goal: RH STG ABLE TO PERFORM INCISION/WOUND CARE W/ASSISTANCE Description: STG Able To Perform Incision/Wound Care With min Assistance. Outcome: Progressing   Problem: RH SAFETY Goal: RH STG ADHERE TO SAFETY PRECAUTIONS W/ASSISTANCE/DEVICE Description: STG Adhere to Safety Precautions With min Assistance and appropriate assistive Device. Outcome: Progressing   Problem: RH PAIN MANAGEMENT Goal: RH STG PAIN MANAGED AT OR BELOW PT'S PAIN GOAL Description: <3 on a 0-10 pain scale Outcome: Progressing   Problem: RH KNOWLEDGE DEFICIT Goal: RH STG INCREASE KNOWLEDGE OF DIABETES Description: Patient will demonstrate knowledge of diabetes medications, dietary restrictions, and follow up care with the MD at discharge with min assist from the staff. Outcome: Progressing Goal: RH STG INCREASE KNOWLEDGE OF HYPERTENSION Description: Patient will demonstrate knowledge of HTN medications, dietary restrictions, and follow up care with the MD at discharge with min assist from the staff.  Outcome: Progressing Goal: RH STG INCREASE KNOWLEDGE OF DYSPHAGIA/FLUID INTAKE Description: Patient will demonstrate knowledge of dysphagia diets, restrictions, and proper swallowing techniques with min assist from staff. Outcome: Progressing Goal: RH STG INCREASE  KNOWLEGDE OF HYPERLIPIDEMIA Description: Patient will demonstrate knowledge of HLD medications, dietary restrictions, and follow up care with the MD at discharge with min assist from the staff.  Outcome: Progressing Goal: RH STG INCREASE KNOWLEDGE OF STROKE PROPHYLAXIS Description: Patient will demonstrate knowledge of stroke prevention medication and follow up care with the MD at discharge with min assist from staff. Outcome: Progressing   Problem: RH BOWEL ELIMINATION Goal: RH STG MANAGE BOWEL W/MEDICATION W/ASSISTANCE Description: STG Manage Bowel with Medication with Assistance. Outcome: Progressing

## 2019-09-20 NOTE — Progress Notes (Addendum)
[facility] PHYSICAL MEDICINE & REHABILITATION PROGRESS NOTE   Subjective/Complaints: Pt still having regular, soft, incontinent stools. Just had one when I saw her this am. Pt doesn't realize that she's been incontinent. Slept last night apparently. Therapy noticing delays in processing as well as less engagement of left side, emerging tone.   ROS: Limited due to cognitive/behavioral    Objective:   No results found. Recent Labs    09/19/19 0508  WBC 10.1  HGB 13.5  HCT 41.3  PLT 373   Recent Labs    09/19/19 0508  NA 138  K 3.6  CL 102  CO2 27  GLUCOSE 168*  BUN 8  CREATININE 0.64  CALCIUM 9.6    Intake/Output Summary (Last 24 hours) at 09/20/2019 0941 Last data filed at 09/20/2019 0700 Gross per 24 hour  Intake 360 ml  Output 300 ml  Net 60 ml     Physical Exam: Vital Signs Blood pressure (!) 150/52, pulse 85, temperature 97.7 F (36.5 C), resp. rate 18, height 5\' 4"  (1.626 m), weight 91 kg, SpO2 96 %. Constitutional: No distress . Vital signs reviewed. Sitting in mushy stool on bed/diaper, legs HEENT: EOMI, oral membranes moist Neck: supple Cardiovascular: RRR without murmur. No JVD    Respiratory: CTA Bilaterally without wheezes or rales. Normal effort    GI: BS +, non-tender, non-distended  Musculoskeletal:     Comments: no edema, left heel cord remains a little tight Neurological: She is alert to self. No focal CN abnl. Left inattention.  Motor: RUE/RLE: 5/5 proximal distal LUE: Shoulder abduction, elbow flexion/extension tr-1/5, handgrip 3-4/5 but engages inconsistently- engages still with tactile>verbal cues  LLE: Hip flexion tr-1/5, distally 0/5 Sensation diminished to touch bilateral plantar feet and LLE, minimal sensory loss LUE. No resting tone was seen today Skin: Skin is warm and dry.  Psychiatric:flat, delayed   Assessment/Plan: 1. Functional deficits secondary to bicortical infarcts, Right frontal SAH which require 3+ hours per day of  interdisciplinary therapy in a comprehensive inpatient rehab setting.  Physiatrist is providing close team supervision and 24 hour management of active medical problems listed below.  Physiatrist and rehab team continue to assess barriers to discharge/monitor patient progress toward functional and medical goals  Care Tool:  Bathing  Bathing activity did not occur: Refused           Bathing assist       Upper Body Dressing/Undressing Upper body dressing   What is the patient wearing?: Hospital gown only    Upper body assist Assist Level: Maximal Assistance - Patient 25 - 49%    Lower Body Dressing/Undressing Lower body dressing      What is the patient wearing?: Pants, Incontinence brief     Lower body assist Assist for lower body dressing: Total Assistance - Patient < 25%     Toileting Toileting    Toileting assist Assist for toileting: Total Assistance - Patient < 25%     Transfers Chair/bed transfer  Transfers assist     Chair/bed transfer assist level: Dependent - mechanical lift     Locomotion Ambulation   Ambulation assist   Ambulation activity did not occur: Safety/medical concerns          Walk 10 feet activity   Assist  Walk 10 feet activity did not occur: Safety/medical concerns        Walk 50 feet activity   Assist Walk 50 feet with 2 turns activity did not occur: Safety/medical concerns  Walk 150 feet activity   Assist Walk 150 feet activity did not occur: Safety/medical concerns         Walk 10 feet on uneven surface  activity   Assist Walk 10 feet on uneven surfaces activity did not occur: Safety/medical concerns         Wheelchair     Assist Will patient use wheelchair at discharge?: No(LT goals not set )             Wheelchair 50 feet with 2 turns activity    Assist            Wheelchair 150 feet activity     Assist          Blood pressure (!) 150/52, pulse 85,  temperature 97.7 F (36.5 C), resp. rate 18, height 5\' 4"  (1.626 m), weight 91 kg, SpO2 96 %.  Medical Problem List and Plan: 1.  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 Patient with bilateral cerebral and right occipital infarcts last week as well as pulmonary emboli with DVT.             -patient may shower             -Interdisciplinary Team Conference today               -Patient continue CIR therapies including PT, OT, and SLP   -PRAFO LLE--not sure this is being placed each night 2. DVT/anticoagulation: Eliquis             -antiplatelet therapy: N/A 3. Pain Management: Lidoderm patch change as directed, hydrocodone as needed 4. Mood: Valium as needed for anxiety and depression             -antipsychotic agents: N/A 5. Neuropsych: This patient is capable of making decisions on her own behalf. 6. Skin/Wound Care: Routine skin checks 7. Fluids/Electrolytes/Nutrition: encourage PO  -2/8 K+ 3.6 8.  PFO.  Follow cardiology services.  TEE completed, suggesting PFO, await further Cards recs.. 9.  Hypertension.  Cardizem 120 mg daily, Lasix 20 mg daily, Cozaar 50 mg daily            2/9  SBP still a little high  -follow for trend, avoid hypotension 10.  Hyperlipidemia: Continue Lipitor 11.  Diabetes mellitus with hyperglycemia.  Hemoglobin A1c 9.4.  SSI.  Patient on Glucophage 1000 mg twice daily prior to admission.  Resume as needed            2/5 cbg's with intermittent elevation. Resume metformin at 250mg  bid  2/6- BGs 150s-206- will con't metformin since only been 24 hours- will monitor  2/7- BGs 130s-170s- will increase Metformin to 500 mg BID  2/9 cbg's under better control, intake inconsistent though 12.  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  Continue nebulizer treatments.     -tolerating therapies thus far 13.  CKD stage II.                2/8 BUN/Cr  8/0.6 14.  Restless leg syndrome.  Requip 2 mg 3 times daily--Reduce to HS as she was only using HS at home  15. BP difference between arms?  2/7- consulted Cardiology- he felt there were no interventions but have them take BP in LUE from now on- order placed.  16. AMS: UA -, UCX pending, not having frank diarrhea   2/9-afebrile today     -receiving several medications which  are very neurosedating including hydrocodone, lorazepam, requip    -held or adjusted dosing of all these     -will check HCT today   Greater than 35 total minutes was spent in examination of patient, assessment of pertinent data,  formulation of a treatment plan, and in discussion with patient and/or family.     LOS: 6 days A FACE TO FACE EVALUATION WAS PERFORMED  Meredith Staggers 09/20/2019, 9:41 AM

## 2019-09-20 NOTE — Progress Notes (Addendum)
Physical Therapy Session Note  Patient Details  Name: [name] [name] MRN: [id number] Date of Birth: [birth year]  Today's Date: 09/20/2019 PT Individual Time: 1100-1158 AND [id number] PT Individual Time Calculation (min): 58 min AND 10 min  Short Term Goals: Week 1:  PT Short Term Goal 1 (Week 1): Pt will initiate gait training PT Short Term Goal 2 (Week 1): Pt will perform bed mobility w/ mod assist PT Short Term Goal 3 (Week 1): Pt will perform bed<>chair transfer w/ max assist consistently PT Short Term Goal 4 (Week 1): Pt will attend to L side of body during functional mobility w/ min cues  Skilled Therapeutic Interventions/Progress Updates:   Session 1:  Pt in TIS and agreeable to therapy, pain 3/10 HA but denies intervention. Worked on sit<>stands at rail in hallway, stood w/ +2 assist, needed mod assist to block RLE from going into extension and to block L knee from buckling, total assist to boost at hips. Pt able to initiate and attend to task better today, however needed max-total to remain standing. Upright improves w/ visual mirror feedback and all weight shifted onto RLE. Provided w/ multiple various cues in attempts to equalize weight distribution, pt unable to motor plan and actively resisting manual assist. Performed x3 reps. Stedy +2  transfer to edge of mat and worked on sitting balance and L attention. Posture improves w/ mirror and verbal feedback to CGA, otherwise mod assist. Sat edge of mat for 10+ min while working on attending to LUE, reaching across midline w/ RUE and anterior trunk lean. Max verbal, tactile, and manual cues to facilitate anterior trunk lean, mod-max cues to attend to L visual field. Stedy transfer back to TIS and performed kinetron 1 min x3 in seated. Pt able to attend to task well, verbal and manual cues to visually attend to BLEs moving in reciprocal pattern, but unable to visually reach midline. Palpable muscle activation on L side. Adjusted pt's TIS  armrest to allow for more upright posture in sitting. Returned to room and ended session in TIS, all needs in reach.   Session 2:  Pt in [place], requesting to return to supine 2/2 fatigue, pt also halfway closing eyes, difficulty keeping eyes open. Stedy transfer to EOB w/ total assist +1 to stand up at first, needed +2 to stand 2nd time 2/2 fatigue and impaired motor planning. Sit>supine w/ total assist +2. Rearranged pt's room to promote L attention w/ watching TV and w/ all objects in L visual field. Ended session in supine, all needs in reach. Missed 20 min of skilled PT 2/2 fatigue.   Therapy Documentation Precautions:  Precautions Precautions: Fall Precaution Comments: recent L humeral fx, no orders Restrictions Weight Bearing Restrictions: No Pain: Pain Assessment Pain Scale: 0-10 Pain Score: 2   Therapy/Group: Individual Therapy  Halil Rentz K Thorn Demas 09/20/2019, 11:59 AM

## 2019-09-21 ENCOUNTER — Inpatient Hospital Stay (HOSPITAL_COMMUNITY): Payer: Medicare Other | Admitting: Physical Therapy

## 2019-09-21 ENCOUNTER — Inpatient Hospital Stay (HOSPITAL_COMMUNITY): Payer: Medicare Other

## 2019-09-21 ENCOUNTER — Inpatient Hospital Stay (HOSPITAL_COMMUNITY): Payer: Medicare Other | Admitting: Speech Pathology

## 2019-09-21 DIAGNOSIS — I69354 Hemiplegia and hemiparesis following cerebral infarction affecting left non-dominant side: Secondary | ICD-10-CM | POA: Diagnosis not present

## 2019-09-21 LAB — GLUCOSE, CAPILLARY
Glucose-Capillary: 156 mg/dL — ABNORMAL HIGH (ref 70–99)
Glucose-Capillary: 136 mg/dL — ABNORMAL HIGH (ref 70–99)
Glucose-Capillary: 134 mg/dL — ABNORMAL HIGH (ref 70–99)
Glucose-Capillary: 135 mg/dL — ABNORMAL HIGH (ref 70–99)

## 2019-09-21 LAB — URINE CULTURE: Culture: NO GROWTH

## 2019-09-21 MED ORDER — DIAZEPAM 2 MG PO TABS
2.0000 mg | ORAL_TABLET | Freq: Every day | ORAL | Status: DC | PRN
  Administered 2019-09-29 – 2019-10-04 (×2): 2 mg via ORAL
  Filled 2019-09-21 (×3): qty 1

## 2019-09-21 MED ORDER — DIAZEPAM 2 MG PO TABS: 2.0000 mg | ORAL_TABLET | ORAL | Status: DC | PRN

## 2019-09-21 MED ORDER — LOPERAMIDE HCL 2 MG PO CAPS
2.0000 mg | ORAL_CAPSULE | ORAL | Status: DC | PRN
  Administered 2019-09-21 – 2019-09-22 (×2): 2 mg via ORAL
  Filled 2019-09-21 (×2): qty 1

## 2019-09-21 MED ORDER — DIAZEPAM 2 MG PO TABS
2.0000 mg | ORAL_TABLET | Freq: Every day | ORAL | Status: DC | PRN
  Administered 2019-09-21 (×2): 2 mg via ORAL
  Filled 2019-09-21 (×2): qty 1

## 2019-09-21 MED ORDER — ROPINIROLE HCL 1 MG PO TABS
2.0000 mg | ORAL_TABLET | Freq: Three times a day (TID) | ORAL | Status: DC
  Administered 2019-09-21 – 2019-09-24 (×8): 2 mg via ORAL
  Administered 2019-09-24: 21:00:00 1 mg via ORAL
  Administered 2019-09-25 – 2019-10-07 (×37): 2 mg via ORAL
  Filled 2019-09-21 (×46): qty 2

## 2019-09-21 NOTE — Plan of Care (Signed)
  Problem: Consults Goal: RH STROKE PATIENT EDUCATION Description: See Patient Education module for education specifics  Outcome: Progressing Goal: Diabetes Guidelines if Diabetic/Glucose > 140 Description: If diabetic or lab glucose is > 140 mg/dl - Initiate Diabetes/Hyperglycemia Guidelines & Document Interventions  Outcome: Progressing   Problem: RH BLADDER ELIMINATION Goal: RH STG MANAGE BLADDER WITH ASSISTANCE Description: STG Manage Bladder With min Assistance Outcome: Progressing   Problem: RH SKIN INTEGRITY Goal: RH STG MAINTAIN SKIN INTEGRITY WITH ASSISTANCE Description: STG Maintain Skin Integrity With min Assistance. Outcome: Progressing Goal: RH STG ABLE TO PERFORM INCISION/WOUND CARE W/ASSISTANCE Description: STG Able To Perform Incision/Wound Care With min Assistance. Outcome: Progressing   Problem: RH SAFETY Goal: RH STG ADHERE TO SAFETY PRECAUTIONS W/ASSISTANCE/DEVICE Description: STG Adhere to Safety Precautions With min Assistance and appropriate assistive Device. Outcome: Progressing   Problem: RH PAIN MANAGEMENT Goal: RH STG PAIN MANAGED AT OR BELOW PT'S PAIN GOAL Description: <3 on a 0-10 pain scale Outcome: Progressing   Problem: RH KNOWLEDGE DEFICIT Goal: RH STG INCREASE KNOWLEDGE OF DIABETES Description: Patient will demonstrate knowledge of diabetes medications, dietary restrictions, and follow up care with the MD at discharge with min assist from the staff. Outcome: Progressing Goal: RH STG INCREASE KNOWLEDGE OF HYPERTENSION Description: Patient will demonstrate knowledge of HTN medications, dietary restrictions, and follow up care with the MD at discharge with min assist from the staff.  Outcome: Progressing Goal: RH STG INCREASE KNOWLEDGE OF DYSPHAGIA/FLUID INTAKE Description: Patient will demonstrate knowledge of dysphagia diets, restrictions, and proper swallowing techniques with min assist from staff. Outcome: Progressing Goal: RH STG INCREASE  KNOWLEGDE OF HYPERLIPIDEMIA Description: Patient will demonstrate knowledge of HLD medications, dietary restrictions, and follow up care with the MD at discharge with min assist from the staff.  Outcome: Progressing Goal: RH STG INCREASE KNOWLEDGE OF STROKE PROPHYLAXIS Description: Patient will demonstrate knowledge of stroke prevention medication and follow up care with the MD at discharge with min assist from staff. Outcome: Progressing   Problem: RH BOWEL ELIMINATION Goal: RH STG MANAGE BOWEL W/MEDICATION W/ASSISTANCE Description: STG Manage Bowel with Medication with Assistance. Outcome: Progressing

## 2019-09-21 NOTE — Telephone Encounter (Signed)
Patient currently in inpatient rehab after a second stroke.

## 2019-09-21 NOTE — Progress Notes (Signed)
Speech Language Pathology Daily Session Note  Patient Details  Name: ANNAHY THRONE MRN: [id number] Date of Birth: 1951-07-04  Today's Date: 09/21/2019 SLP Individual Time: 1255-1335 SLP Individual Time Calculation (min): 40 min  Short Term Goals: Week 1: SLP Short Term Goal 1 (Week 1): Pt will sustain attention to tasks for 5 minute intervals with Mod A cues for redirection. SLP Short Term Goal 2 (Week 1): Pt will recall new and/or complex daily information with Mod A verbal/visual cues for use of compensatory strategies. SLP Short Term Goal 3 (Week 1): Pt will demonstrate problem solving during basic to mildly complex tasks with Mod A verbal/visual cues. SLP Short Term Goal 4 (Week 1): Pt will demonstrate emergent awareness by detecting errors during functional tasks with Mod A verbal/visual cues.  Skilled Therapeutic Interventions: Skilled treatment session focused on cognitive goals. Upon arrival, patient was awake and was extremely restless in bed. SLP attempted to reposition the patient X 3 to maximize safety with PO intake but attempts were only effective for ~60 seconds due to level of restlessness. Patient required total A for scooping of food items due to severity of attention issues but was able to bring the spoon to her mouth independently. Patient also scanned to the left field of environment to locate the spoon from the clinician with overall Min A verbal cues. Patient was independently oriented to month, situation and place and could successfully participate in a basic conversation. Patient was also apologetic throughout session due severe restlessness. Patient handed off to transport. Continue with current plan of care.      Pain No/Denies Pain   Therapy/Group: Individual Therapy  Malayja Freund 09/21/2019, 2:35 PM

## 2019-09-21 NOTE — Progress Notes (Addendum)
Cranial CT scan showed progressive areas of cortical infarct when compared with the study from 3 days prior.  Previously seen subarachnoid hemorrhage now resolved.  No new hemorrhage seen.  Discussed with neurology services no change in course of care and continue Eliquis.  Her son was contacted and discussed at length latest findings of CT scan and plan of care.  Son did clarify that she did use Requip 3 times a day prior to admission

## 2019-09-21 NOTE — Progress Notes (Addendum)
Physical Therapy Session Note  Patient Details  Name: [name] [name] MRN: [id number] Date of Birth: 09/27/1950  Today's Date: 09/21/2019 PT Individual Time: 0913-1021 PT Individual Time Calculation (min): 68 min   Short Term Goals: Week 1:  PT Short Term Goal 1 (Week 1): Pt will initiate gait training PT Short Term Goal 2 (Week 1): Pt will perform bed mobility w/ mod assist PT Short Term Goal 3 (Week 1): Pt will perform bed<>chair transfer w/ max assist consistently PT Short Term Goal 4 (Week 1): Pt will attend to L side of body during functional mobility w/ min cues  Skilled Therapeutic Interventions/Progress Updates:   Pt in supine, noted to be incontinent of bowel and pt was attempting to clean it up w/ blanket. No c/o pain. Therapist and NT performed pericare and brief management, max-total assist to roll in both directions while NT changed brief and linens. Once finished, had pt wash up in supine, total assist to wash RUE and verbal/visual/tactile cues to wash face, LUE, and chest. Donned pants total assist in supine, w/ max cues, pt was able to perform small bridge to bring pants over hips. Supine>sit w/ total assist +2 and donned shirt w/ total assist for time management. Min-mod assist for static sitting balance for 15 min while pt brushed hair and then while 2nd helper went to retrieve stedy. Needed mod manual and verbal cues to attend to task and to maintain midline. Stedy transfer +2 to TIS, improved ability to initiate sit>stand. Total assist w/c transport to/from therapy gym. Worked on w/c parts management w/ headrest to improve support as pt tends to lean to R. Purpose not to fix posture, but to improve safety as pt leans significantly to R when head is unsupported. Worked on sit>stands at rail w/ max verbal and tactile cues to initiate, step-by-step sequencing, and to reach full upright posture. Maintained static stance w/ max-total assist for 20-30 sec at a time x3. Therapist  providing manual facilitation of RLE neutral extension in stance. Returned to room via w/c. Stedy transfer back to EOB. Ended session in supine, all needs in reach.   Therapy Documentation Precautions:  Precautions Precautions: Fall Precaution Comments: recent L humeral fx, no orders Restrictions Weight Bearing Restrictions: No Pain: Pain Assessment Pain Scale: 0-10 Pain Score: 0-No pain  Therapy/Group: Individual Therapy  [name] Clent Demark 09/21/2019, 10:23 AM

## 2019-09-21 NOTE — Progress Notes (Signed)
Spoke with son at bedside for an extended period of time. Son very concerned with her restless legs he stated this had been a big issue for the last number of years and she has been maintained on Requip 2 mg 3 times daily for many years as well as Valium 5 mg daily as needed. Her lethargy is much improved and cranial CT scan reviewed by neurology services advised to continue Eliquis. Son request to resume Requip 2 mg 3 times daily as well as Valium 5 mg daily as needed. Orders have been submitted spoke with nursing in regards to monitoring mental status.

## 2019-09-21 NOTE — Progress Notes (Signed)
CT called to take patient for head CT ordered on 2/9 at 09:30 for increased lethargy and confusion, as well as increased tone on left side. This nurse informed CT that patient unable to stay still due to restless leg syndrome. On-call provider contacted about potential medication for patient; due to indication for CT, no new orders given. CT notified and will contact day shift nurse. This nurse will report to next shift.

## 2019-09-21 NOTE — Progress Notes (Addendum)
Occupational Therapy Session Note  Patient Details  Name: [name] [name] MRN: [id number] Date of Birth: 1950/09/21  Today's Date: 09/21/2019 OT Individual Time: 0730-0830 OT Individual Time Calculation (min): 60 min   Session 2: OT Individual Time: 1552-0802 OT Individual Time Calculation (min): 24 min    Short Term Goals: Week 1:  OT Short Term Goal 1 (Week 1): Pt will complete sit<>stand with consistent mod A in preparation for BADL tasks OT Short Term Goal 2 (Week 1): Pt will locate 2 grooming items on L side of the sink with min questioing cues OT Short Term Goal 3 (Week 1): Pt will complete 1 step of UB dressing task without verbal cues  Skilled Therapeutic Interventions/Progress Updates:    Pt received supine with no c/o pain, incredibly restless. Pt fully oriented. Pt sweating and her temperature was checked- 98.6 orally. Assisted pt in changing brief and completing peri hygiene 2/2 pt putting hands in her brief frequently. Pt able to wash anterior/posteriorly with min cueing for initiation and termination. Pt then rolled to her side and began pulling at brief around her buttocks. When asked if she needed to have a BM, pt stated " I think so." Very small amount of incontinence already present and pt was rolled to the R with CGA for bedpan placement. It was deemed unsafe to get pt OOB this session 2/2 restlessness. Pt voided very loose BM on bedpan and was provided max A for peri hygiene and to don new brief. Total A to roll L. Pt was transferred to EOB with max A after rolling to her R side. Pt sat EOB and ate breakfast with min-mod cueing overall for attention to task and progression. Pt with poor sitting balance with anterior and L lean, requiring mod A overall to maintain upright posture. Pt was able to self feed breakfast after set up assist and had no s/s of aspiration. Pt returned to supine in bed and the saebo e-stim was placed on her L middle and anterior deltoid as described  below. Pt reported no pain before or after application and there were no adverse skin reactions. 20 min unattended completed.   Saebo Stim One 330 pulse width 35 Hz pulse rate On 8 sec/ off 8 sec Ramp up/ down 2 sec Symmetrical Biphasic wave form  Max intensity 135m at 500 Ohm load  Session 2: Pt received supine, incredibly restless, reporting being very hot but no pain. Pt requested a drink and was assisted in setting up cup to drink and holding steady 2/2 restlessness. Pt completed bed mobility, rolling R and L for chuck pad to be adjusted and brief checked- no BM present. Pt required total A for rolling to the L. Pt unable to activate L hand gross grasp this session despite variety of cueing. Pt completed similar task as yesterday's session to assess carryover, of clipping/reaching with resistive clothespins and pt demonstrated improvement in termination, requiring only min cueing to follow commands. Pt completed oral care at bed level with set up assist and min cueing. Pt was left supine with all needs met, bed alarm set.   Therapy Documentation Precautions:  Precautions Precautions: Fall Precaution Comments: recent L humeral fx, no orders Restrictions Weight Bearing Restrictions: No  Therapy/Group: Individual Therapy  SCRISLYN WILLBANKS2/05/2020, 6:54 AM

## 2019-09-21 NOTE — Progress Notes (Signed)
[place] PHYSICAL MEDICINE & REHABILITATION PROGRESS NOTE   Subjective/Complaints: AOx3 this morning, improved cognition and arousal with yesterday's medication changes. Asks for morning medications. Discussed changes that were made due to her lethargy. With severe restless leg syndrome this morning.     ROS: Limited due to cognitive/behavioral    Objective:   No results found. Recent Labs    09/19/19 0508  WBC 10.1  HGB 13.5  HCT 41.3  PLT 373   Recent Labs    09/19/19 0508  NA 138  K 3.6  CL 102  CO2 27  GLUCOSE 168*  BUN 8  CREATININE 0.64  CALCIUM 9.6    Intake/Output Summary (Last 24 hours) at 09/21/2019 [id number] Last data filed at 09/21/2019 0330 Gross per 24 hour  Intake 120 ml  Output 350 ml  Net -230 ml     Physical Exam: Vital Signs Blood pressure (!) 162/53, pulse 100, temperature 98.5 F (36.9 C), resp. rate 18, height 5\' 4"  (1.626 m), weight 91 kg, SpO2 96 %. Constitutional: No distress . Vital signs reviewed. Lying in bed.  HEENT: EOMI, oral membranes moist Neck: supple Cardiovascular: RRR without murmur. No JVD    Respiratory: CTA Bilaterally without wheezes or rales. Normal effort    GI: BS +, non-tender, non-distended  Musculoskeletal:     Comments: no edema, left heel cord remains a little tight Neurological: She is alert to self. No focal CN abnl. Left inattention.  Motor: RUE/RLE: 5/5 proximal distal LUE: Shoulder abduction, elbow flexion/extension tr-1/5, handgrip 3-4/5 but engages inconsistently- engages still with tactile>verbal cues  LLE: Hip flexion tr-1/5, distally 0/5 Sensation diminished to touch bilateral plantar feet and LLE, minimal sensory loss LUE. No resting tone was seen today Severe restless leg symptoms this morning. Skin: Skin is warm and dry.  Psychiatric:flat, delayed   Assessment/Plan: 1. Functional deficits secondary to bicortical infarcts, Right frontal SAH which require 3+ hours per day of interdisciplinary  therapy in a comprehensive inpatient rehab setting.  Physiatrist is providing close team supervision and 24 hour management of active medical problems listed below.  Physiatrist and rehab team continue to assess barriers to discharge/monitor patient progress toward functional and medical goals  Care Tool:  Bathing  Bathing activity did not occur: Refused           Bathing assist       Upper Body Dressing/Undressing Upper body dressing   What is the patient wearing?: Hospital gown only    Upper body assist Assist Level: Maximal Assistance - Patient 25 - 49%    Lower Body Dressing/Undressing Lower body dressing      What is the patient wearing?: Pants, Incontinence brief     Lower body assist Assist for lower body dressing: Total Assistance - Patient < 25%     Toileting Toileting    Toileting assist Assist for toileting: Total Assistance - Patient < 25%     Transfers Chair/bed transfer  Transfers assist     Chair/bed transfer assist level: Dependent - mechanical lift     Locomotion Ambulation   Ambulation assist   Ambulation activity did not occur: Safety/medical concerns          Walk 10 feet activity   Assist  Walk 10 feet activity did not occur: Safety/medical concerns        Walk 50 feet activity   Assist Walk 50 feet with 2 turns activity did not occur: Safety/medical concerns         Walk  150 feet activity   Assist Walk 150 feet activity did not occur: Safety/medical concerns         Walk 10 feet on uneven surface  activity   Assist Walk 10 feet on uneven surfaces activity did not occur: Safety/medical concerns         Wheelchair     Assist Will patient use wheelchair at discharge?: No(LT goals not set )             Wheelchair 50 feet with 2 turns activity    Assist            Wheelchair 150 feet activity     Assist          Blood pressure (!) 162/53, pulse 100, temperature 98.5 F  (36.9 C), resp. rate 18, height 5\' 4"  (1.626 m), weight 91 kg, SpO2 96 %.  Medical Problem List and Plan: 1.  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 Patient with bilateral cerebral and right occipital infarcts last week as well as pulmonary emboli with DVT.             -patient may shower             -Interdisciplinary Team Conference today               -Patient continue CIR therapies including PT, OT, and SLP   -PRAFO LLE--not sure this is being placed each night 2. DVT/anticoagulation: Eliquis             -antiplatelet therapy: N/A 3. Pain Management: Lidoderm patch change as directed, hydrocodone as needed 4. Mood: Valium as needed for anxiety and depression             -antipsychotic agents: N/A 5. Neuropsych: This patient is capable of making decisions on her own behalf. 6. Skin/Wound Care: Routine skin checks 7. Fluids/Electrolytes/Nutrition: encourage PO  -2/8 K+ 3.6 8.  PFO.  Follow cardiology services.  TEE completed, suggesting PFO, await further Cards recs.. 9.  Hypertension.  Cardizem 120 mg daily, Lasix 20 mg daily, Cozaar 50 mg daily            2/9  SBP still a little high  2/10: continues to be elevated with high of 180/133 last night, but 131/61 yesterday afternoon. Continue to monitor given lability.   -follow for trend, avoid hypotension 10.  Hyperlipidemia: Continue Lipitor 11.  Diabetes mellitus with hyperglycemia.  Hemoglobin A1c 9.4.  SSI.  Patient on Glucophage 1000 mg twice daily prior to admission.  Resume as needed            2/5 cbg's with intermittent elevation. Resume metformin at 250mg  bid  2/6- BGs 150s-206- will con't metformin since only been 24 hours- will monitor  2/7- BGs 130s-170s- will increase Metformin to 500 mg BID  2/9 cbg's under better control, intake inconsistent though  2/10: adequate control.  12.  COPD/tobacco abuse with chronic hypoxemic respiratory failure  on home oxygen dependent.  Continue nebulizer treatments.     -tolerating therapies thus far 13.  CKD stage II.                2/8 BUN/Cr 8/0.6 14.  Restless leg syndrome.  Requip 2 mg 3 times daily--Reduce to HS as she was only using HS at home   2/10: Patient states that she was using Requip TID at home and she does have severe restless legs this morning. Lethargy appears much  improved with yesterday's medication changes. If remains stable and restless legs continue to be severe, can consider increasing to BID and assessing for worsening  Lethargy.  15. BP difference between arms?  2/7- consulted Cardiology- he felt there were no interventions but have them take BP in LUE from now on- order placed.  16. AMS: UA -, UCX pending, not having frank diarrhea   2/9-afebrile today     -receiving several medications which are very neurosedating including hydrocodone, lorazepam, requip    -held or adjusted dosing of all these     -will check HCT today. 2/10: Ordered one-time 2mg  valium to calm patient prior to head CT so she will be able to tolerate study.    Greater than 35 total minutes was spent in examination of patient, assessment of pertinent data,  formulation of a treatment plan, and in discussion with patient and/or family.     LOS: 7 days A FACE TO FACE EVALUATION WAS PERFORMED  [name] [name] 09/21/2019, 9:37 AM

## 2019-09-21 NOTE — Progress Notes (Signed)
Physical Therapy Weekly Progress Note  Patient Details  Name: [name] [name] MRN: [id number] Date of Birth: 03/13/1951  Beginning of progress report period: September 15, 2019 End of progress report period: September 22, 2019  Today's Date: 09/22/2019 PT Individual Time: 0920-1005 PT Individual Time Calculation (min): 45 min  and Today's Date: 09/22/2019 PT Missed Time: 30 Minutes Missed Time Reason: Patient fatigue  Patient has met 0 of 4 short term goals. Pt has made minimal progress over last week 2/2 worsening of functional status and significantly impaired motor planning. Pt w/ CT scan today showing progressive areas of infarction consistent w/ overall worsening of her clinical presentation and deficits. She now requires +2 assist for all mobility including bed mobility and stedy transfers. Pt requires significant skilled cues to follow simple and familiar commands, although is able to verbalize that she is not being successful at task.  Patient continues to demonstrate the following deficits muscle weakness and muscle joint tightness, decreased cardiorespiratoy endurance, impaired timing and sequencing, unbalanced muscle activation, motor apraxia, decreased coordination and decreased motor planning, decreased midline orientation and decreased attention to left, decreased initiation, decreased attention, decreased awareness, decreased problem solving, decreased safety awareness, decreased memory and delayed processing and decreased sitting balance, decreased standing balance, decreased postural control, hemiplegia and decreased balance strategies and therefore will continue to benefit from skilled PT intervention to increase functional independence with mobility.  Patient not progressing toward long term goals.  See goal revision..  Plan of care revisions: LTGs downgraded to mod assist overall and gait goal discontinued 2/2 lack of progress.  PT Short Term Goals Week 1:  PT Short Term Goal 1  (Week 1): Pt will initiate gait training PT Short Term Goal 1 - Progress (Week 1): Not progressing PT Short Term Goal 2 (Week 1): Pt will perform bed mobility w/ mod assist PT Short Term Goal 2 - Progress (Week 1): Not progressing PT Short Term Goal 3 (Week 1): Pt will perform bed<>chair transfer w/ max assist consistently PT Short Term Goal 3 - Progress (Week 1): Not progressing PT Short Term Goal 4 (Week 1): Pt will attend to L side of body during functional mobility w/ min cues PT Short Term Goal 4 - Progress (Week 1): Not progressing Week 2:  PT Short Term Goal 1 (Week 2): Pt will perform bed mobility w/ max assist +1 PT Short Term Goal 2 (Week 2): Pt will perform sit<>stand w/ max assist +1 PT Short Term Goal 3 (Week 2): Pt will maintain static sitting balance w/ CGA PT Short Term Goal 4 (Week 2): Pt will initiate functional mobility w/ min cues 50% of the time  Skilled Therapeutic Interventions/Progress Updates:   Pt received in supine, restlessness much improved today and pt denied pain. Pt incontinent of void. R/L rolling w/ max-total assist x1 for pericare, brief management, and changing of sheets. Pt continues to need max multimodal cues to follow simple 1-step commands. Donned pants and socks in supine total assist for time management. +2 assist supine>sit. Stedy transfer +2 to TIS w/ verbal and tactile cues for full upright posture in TIS. Total assist w/c transport to/from therapy gym. Worked on sit<>stands at rail. 1st stand required only max assist +1 and pt able to reach full upright posture w/ use of mirror and mod cues. Performed x3 reps and upon 3rd rep, pt required total assist and was not able to reach full upright despite max cues. Pt reports feeling more tired and pt noticeably more  fatigued. Worked on L environmental awareness and motor planning w/ simple task to reach for red pegs and place on peg board. Pt able to perform x1 w/o cues but needed max tactile, verbal, and  hand-over-hand cues to terminate task and initiate reaching for another peg. Pt also w/ increased frustration towards therapist assisting her. Pt reporting she was too tired. Returned to room and ended session tilted in TIS, alarm belt engaged. All needs in reach. Missed 30 min of skilled PT 2/2 fatigue.   Therapy Documentation Precautions:  Precautions Precautions: Fall Precaution Comments: recent L humeral fx, no orders Restrictions Weight Bearing Restrictions: No Vital Signs: Therapy Vitals Temp: 98.3 F (36.8 C) Pulse Rate: (!) 105 Resp: 20 BP: (!) 172/66 Patient Position (if appropriate): Lying Oxygen Therapy SpO2: 96 % O2 Device: Room Air  Therapy/Group: Individual Therapy  Jossalyn Forgione Clent Demark 09/21/2019, 5:29 PM

## 2019-09-21 NOTE — Progress Notes (Signed)
Patient restless all night. Unable to sleep due to legs and right arm movement. States she "can't help it. I'm sorry I just can't stay still." ROM exercises/stretches attempted by charge nurse to relieve restless leg syndrome; no relief. Left prafo boot worn for most of night until patient asked for it to be removed around 04:00. No signs of distress at this time, call light within reach.

## 2019-09-22 ENCOUNTER — Inpatient Hospital Stay (HOSPITAL_COMMUNITY): Payer: Medicare Other

## 2019-09-22 ENCOUNTER — Inpatient Hospital Stay (HOSPITAL_COMMUNITY): Payer: Medicare Other | Admitting: Speech Pathology

## 2019-09-22 ENCOUNTER — Inpatient Hospital Stay (HOSPITAL_COMMUNITY): Payer: Medicare Other | Admitting: Physical Therapy

## 2019-09-22 DIAGNOSIS — I2699 Other pulmonary embolism without acute cor pulmonale: Secondary | ICD-10-CM

## 2019-09-22 DIAGNOSIS — Z8673 Personal history of transient ischemic attack (TIA), and cerebral infarction without residual deficits: Secondary | ICD-10-CM

## 2019-09-22 LAB — GLUCOSE, CAPILLARY
Glucose-Capillary: 161 mg/dL — ABNORMAL HIGH (ref 70–99)
Glucose-Capillary: 211 mg/dL — ABNORMAL HIGH (ref 70–99)
Glucose-Capillary: 152 mg/dL — ABNORMAL HIGH (ref 70–99)
Glucose-Capillary: 151 mg/dL — ABNORMAL HIGH (ref 70–99)

## 2019-09-22 MED ORDER — ALUM & MAG HYDROXIDE-SIMETH 200-200-20 MG/5 ML NICU TOPICAL: 1.0000 "application " | TOPICAL | Status: DC | PRN

## 2019-09-22 MED ORDER — IOHEXOL 300 MG/ML  SOLN
100.0000 mL | Freq: Once | INTRAMUSCULAR | Status: AC | PRN
  Administered 2019-09-22: 100 mL via INTRAVENOUS

## 2019-09-22 MED ORDER — MUSCLE RUB 10-15 % EX CREA
TOPICAL_CREAM | Freq: Two times a day (BID) | CUTANEOUS | Status: DC
  Administered 2019-09-28: 1 via TOPICAL
  Filled 2019-09-22: qty 85

## 2019-09-22 MED ORDER — TRAMADOL HCL 50 MG PO TABS
25.0000 mg | ORAL_TABLET | Freq: Two times a day (BID) | ORAL | Status: DC | PRN
  Administered 2019-09-22 – 2019-10-04 (×6): 25 mg via ORAL
  Filled 2019-09-22 (×7): qty 1

## 2019-09-22 MED ORDER — SIMETHICONE 80 MG PO CHEW
80.0000 mg | CHEWABLE_TABLET | Freq: Four times a day (QID) | ORAL | Status: DC | PRN
  Administered 2019-09-22: 80 mg via ORAL
  Filled 2019-09-22: qty 1

## 2019-09-22 NOTE — Consult Note (Addendum)
Stroke Neurology Consultation Note  Consult Requested by: Silvestre Mesi, PA, Inpatient Rehab  Reason for Consult: increased lethargy and gaze preference  Consult Date:  09/22/19  History of Present Illness:  [name] [name] is an 69 y.o. 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 Post d/c was  found to have a PE during w/u for SOB and age indeterminate R peroneal DVT, started on Xarelto. She was also found to have a PFO.  Plans were for hypercoagulable workup. Prior to completion, she developed recurrent B embolic infarcts as well as a R frontal lobe SAH, was admitted to the hospital, changed Xarelto to PE dose Eliquis and d/c to inpatient rehab.   While in CIR, 2/9 therapy noticing delays in processing as well as less engagement of left side and increased lethargy. Requip and valium were both held with some improvement. 2/10 CT head showed progressive areas of cortical infarcts, and SAH resolved. As per son's request, Requip and valium were resumed and today lethargy again further improved but patient developed new R gaze preference and L neglect. No seizure noted. MRI showed progression with previous bifrontal infarcts as well as small new area of infarcts at right lateral occipital lobe. Neurology was called for re-evaluation.   Past Medical History:  Diagnosis Date  . Aneurysm, thoracic aortic ([place])   . Anxiety   . Arthritis   . CAD (coronary artery disease)   . Chronic kidney disease    Renal insufficiency  . COPD (chronic obstructive pulmonary disease) ([place])   . Depression   . Diabetes mellitus   . Diverticulosis of colon (without mention of hemorrhage)   . Duodenitis without mention of hemorrhage   . Embolic stroke ([place])   . GERD (gastroesophageal reflux disease)   . Hemorrhoids   . Hypercholesteremia   . Hypertension   . Insomnia   . Pulmonary embolism ([place])   . Tinnitus   . Vertigo      Past  Surgical History:  Procedure Laterality Date  . ANGIOPLASTY    . BUBBLE STUDY  09/14/2019   Procedure: BUBBLE STUDY;  Surgeon: Donato Heinz, MD;  Location: [place];  Service: Endoscopy;;  . CHOLECYSTECTOMY    . ROTATOR CUFF REPAIR Right 04/2012  . TEE WITHOUT CARDIOVERSION N/A 09/14/2019   Procedure: TRANSESOPHAGEAL ECHOCARDIOGRAM (TEE);  Surgeon: Donato Heinz, MD;  Location: [facility] ENDOSCOPY;  Service: Endoscopy;  Laterality: N/A;  . TUBAL LIGATION      Family History  Problem Relation Age of Onset  . Diabetes Mother   . Hemophilia Mother   . Breast cancer Sister   . Heart disease Father   . Colon polyps Father   . Diabetes Brother   . Colon polyps Sister     Social History:  reports that she quit smoking about 13 months ago. Her smoking use included cigarettes. She has never used smokeless tobacco. She reports that she does not drink alcohol or use drugs.  Review of Systems: A full ROS was attempted today and was able to be performed.  Systems assessed include - Constitutional, Eyes, HENT, Respiratory, Cardiovascular, Gastrointestinal, Genitourinary, Integument/breast, Hematologic/lymphatic, Musculoskeletal, Neurological, Behavioral/Psych, Endocrine, Allergic/Immunologic - with pertinent responses as per HPI.  Allergies:  Allergies  Allergen Reactions  . Guaifenesin & Derivatives Anaphylaxis    Severe Reaction  . Tape Other (See Comments)    TAPE PEELS OFF THE SKIN!!!!!!!  . Benazepril Cough  . Morphine Nausea Only  .  Zyrtec [Cetirizine] Swelling and Other (See Comments)    Hands and feet became swollen  . Latex Rash and Other (See Comments)    No powdered gloves!!    Medications: I have reviewed the patient's current medications.  Test Results: CBC:  Recent Labs  Lab 09/19/19 0508  WBC 10.1  NEUTROABS 5.9  HGB 13.5  HCT 41.3  MCV 87.5  PLT [id number]   Basic Metabolic Panel:  Recent Labs  Lab 09/19/19 0508  NA 138  K 3.6  CL 102  CO2 27   GLUCOSE 168*  BUN 8  CREATININE 0.64  CALCIUM 9.6   CBG:  Recent Labs  Lab 09/21/19 1230 09/21/19 1726 09/21/19 2057 09/22/19 0638 09/22/19 1222  GLUCAP 136* 134* 135* 161* 211*   Urinalysis:  Recent Labs  Lab 09/19/19 1900  COLORURINE YELLOW  LABSPEC 1.017  PHURINE 6.0  GLUCOSEU NEGATIVE  HGBUR NEGATIVE  BILIRUBINUR NEGATIVE  KETONESUR NEGATIVE  PROTEINUR NEGATIVE  NITRITE NEGATIVE  LEUKOCYTESUR NEGATIVE   Microbiology:  Results for orders placed or performed during the hospital encounter of 09/14/19  Culture, Urine     Status: None   Collection Time: 09/19/19  4:51 PM   Specimen: Urine, Random  Result Value Ref Range Status   Specimen Description URINE, RANDOM  Final   Special Requests NONE  Final   Culture   Final    NO GROWTH Performed at [facility], [place] [address], [place], [place] [address]    Report Status 09/21/2019 FINAL  Final   Lipid Panel:     Component Value Date/Time   CHOL 136 08/19/2019 0500   TRIG 70 08/19/2019 0500   HDL 53 08/19/2019 0500   CHOLHDL 2.6 08/19/2019 0500   VLDL 14 08/19/2019 0500   LDLCALC 69 08/19/2019 0500   LDLCALC 98 04/26/2019 0932   HgbA1c:  Lab Results  Component Value Date   HGBA1C 9.4 (H) 08/19/2019   Urine Drug Screen:     Component Value Date/Time   LABOPIA POSITIVE (A) 09/07/2019 1950   COCAINSCRNUR NONE DETECTED 09/07/2019 1950   LABBENZ POSITIVE (A) 09/07/2019 1950   AMPHETMU NONE DETECTED 09/07/2019 1950   THCU NONE DETECTED 09/07/2019 1950   LABBARB NONE DETECTED 09/07/2019 1950    CT HEAD WO CONTRAST  Result Date: 09/21/2019 CLINICAL DATA:  Increased lethargy and confusion EXAM: CT HEAD WITHOUT CONTRAST TECHNIQUE: Contiguous axial images were obtained from the base of the skull through the vertex without intravenous contrast. COMPARISON:  09/14/19 FINDINGS: Brain: Progressive cortical infarction is noted bilaterally particularly within the higher parietal lobe and frontal lobes. Some  parietooccipital infarct is noted on the right as well. Previously seen subarachnoid hemorrhage is not well appreciated on today's exam. Vascular: No hyperdense vessel or unexpected calcification. Skull: Normal. Negative for fracture or focal lesion. Sinuses/Orbits: No acute finding. Other: None. IMPRESSION: Progressive areas of cortical infarct when compared with the study from 3 days previous. Previously seen subarachnoid hemorrhage has resolved in the interval. No new hemorrhage is seen. Electronically Signed   By: Inez Catalina M.D.   On: 09/21/2019 14:29   CT HEAD WO CONTRAST  Result Date: 09/14/2019 CLINICAL DATA:  Neuro deficit with acute stroke suspected EXAM: CT HEAD WITHOUT CONTRAST TECHNIQUE: Contiguous axial images were obtained from the base of the skull through the vertex without intravenous contrast. COMPARISON:  09/11/2019 FINDINGS: Brain: Small patchy acute to subacute infarcts along the bilateral cerebral convexities with subarachnoid hemorrhage along the high and anterior right  frontal sulcus, appearance stable from prior MRI. No evidence of acute infarct or interval hemorrhage. No hydrocephalus or worrisome swelling. Vascular: No hyperdense vessel or unexpected calcification. Skull: Normal. Negative for fracture or focal lesion. Sinuses/Orbits: No acute finding. IMPRESSION: Multifocal cortical infarction with small volume subarachnoid hemorrhage in the high right frontal lobe. No new or progressive finding when compared to brain MRI 09/11/2019. Electronically Signed   By: Monte Fantasia M.D.   On: 09/14/2019 11:07   CT Chest W Contrast  Result Date: 08/24/2019 CLINICAL DATA:  Dyspnea.  COPD.  Former smoker. EXAM: CT CHEST WITH CONTRAST TECHNIQUE: Multidetector CT imaging of the chest was performed during intravenous contrast administration. CONTRAST:  82mL ISOVUE-300 IOPAMIDOL (ISOVUE-300) INJECTION 61% COMPARISON:  08/18/2019 chest radiograph. 10/27/2015 chest CT angiogram. FINDINGS:  Cardiovascular: Normal heart size. No significant pericardial effusion/thickening. Three-vessel coronary atherosclerosis. Atherosclerotic thoracic aorta with stable ectatic 4.0 cm ascending thoracic aorta. Stable dilated main pulmonary artery (3.8 cm diameter). Incidentally noted are acute segmental and subsegmental pulmonary emboli in the bilateral upper and bilateral lower lobes. No central or saddle pulmonary emboli. Mediastinum/Nodes: No discrete thyroid nodules. Unremarkable esophagus. No pathologically enlarged axillary, mediastinal or hilar lymph nodes. Lungs/Pleura: No pneumothorax. No pleural effusion. Mild centrilobular and paraseptal emphysema. No acute consolidative airspace disease, lung masses or significant pulmonary nodules. Upper abdomen: No acute abnormality. Musculoskeletal: No aggressive appearing focal osseous lesions. Moderate thoracic spondylosis. IMPRESSION: 1. Acute bilateral segmental and subsegmental pulmonary emboli. 2. Stable dilated main pulmonary artery, suggesting chronic pulmonary arterial hypertension. 3. Stable ectatic 4.0 cm ascending thoracic aorta. Recommend annual imaging followup by CTA or MRA. This recommendation follows 2010 ACCF/AHA/AATS/ACR/ASA/SCA/SCAI/SIR/STS/SVM Guidelines for the Diagnosis and Management of Patients with Thoracic Aortic Disease. Circulation. 2010; [id number]. Aortic aneurysm NOS (ICD10-I71.9). 4. Three-vessel coronary atherosclerosis. Aortic Atherosclerosis (ICD10-I70.0) and Emphysema (ICD10-J43.9). Critical Value/emergent results were called by telephone at the time of interpretation on 08/24/2019 at 8:22 am to provider Dyke Maes, RN in DR. PICKARD's office, who verbally acknowledged these results. Electronically Signed   By: Ilona Sorrel M.D.   On: 08/24/2019 08:32   MR BRAIN WO CONTRAST  Result Date: 09/11/2019 CLINICAL DATA:  Initial evaluation for acute difficulty walking, stroke suspected. EXAM: MRI HEAD WITHOUT CONTRAST TECHNIQUE:  Multiplanar, multiecho pulse sequences of the brain and surrounding structures were obtained without intravenous contrast. COMPARISON:  Comparison made with multiple recent brain MRIs, most recent of which from 09/07/2019. FINDINGS: Brain: Generalized age-related cerebral atrophy with chronic microvascular ischemic disease again noted. Multiple remote lacunar infarcts present within the hemispheric cerebral white matter. Continued interval evolution of previously seen subacute watershed type infarcts involving the bilateral cerebral hemispheres. However, multiple new watershed type infarcts are now seen on today's exam, increased in size and number but with similar distribution as compared to previous. Overall, involvement is slightly worse within the right cerebral hemisphere as compared to the left. New patchy involvement of the bilateral parieto-occipital regions as well, also greater on the right (series 5, image 66). Now seen are a few scattered foci of susceptibility artifact associated with these infarcts, consistent with associated petechial hemorrhage (series 14, image 46). Additionally, there is a serpiginous focus of FLAIR and susceptibility artifact seen layering along a cortical sulcus within the right frontal lobe, concerning for a small volume subarachnoid hemorrhage, new from previous (series 14, image 46). No frank intraparenchymal hematoma. No significant mass effect. No mass lesion or midline shift. No hydrocephalus. No extra-axial fluid collection. Pituitary gland suprasellar region normal. Midline structures intact.  Vascular: Major intracranial vascular flow voids are maintained. Skull and upper cervical spine: Craniocervical junction normal. Upper cervical spine within normal limits. Bone marrow signal intensity normal. No scalp soft tissue abnormality. Sinuses/Orbits: Globes and orbital soft tissues within normal limits. Paranasal sinuses remain largely clear. No significant mastoid effusion.  Other: None. IMPRESSION: 1. Continued interval evolution of previously seen subacute infarcts, with multiple new scattered acute ischemic watershed type infarcts involving the bilateral cerebral hemispheres, right greater than left. Associated petechial hemorrhage about a few of these infarcts without frank hemorrhagic transformation. 2. Small volume subarachnoid hemorrhage layering along the cortical sulcus within the right frontal lobe, new from previous. 3. Otherwise stable exam with underlying cerebral atrophy, chronic microvascular ischemic disease, and multiple remote lacunar infarcts involving the hemispheric cerebral white matter. Critical Value/emergent results were called by telephone at the time of interpretation on 09/11/2019 at 8:43 pm to provider [facility] , who verbally acknowledged these results. Electronically Signed   By: Jeannine Boga M.D.   On: 09/11/2019 20:43   MR BRAIN WO CONTRAST  Result Date: 09/07/2019 CLINICAL DATA:  Initial evaluation for no deficit, possible acute stroke. History of recent strokes earlier this month. EXAM: MRI HEAD WITHOUT CONTRAST TECHNIQUE: Multiplanar, multiecho pulse sequences of the brain and surrounding structures were obtained without intravenous contrast. COMPARISON:  Previous CT from earlier same day as well as prior MRI from 08/18/2019. FINDINGS: Brain: Generalized age-related cerebral atrophy with chronic microvascular ischemic disease the and noted. Multiple remote lacunar infarcts noted within the hemispheric cerebral white matter. There has been interval evolution of previously seen small volume watershed type infarcts involving the bilateral cerebral hemispheres. Overall, number and distribution of these infarcts is relatively similar, with perhaps a few new subcentimeter foci seen bilaterally. Overall, appearance is felt to be consistent with normal expected interval evolution, with no definite new or significant infarct identified. No  evidence for hemorrhagic transformation, mass effect, or other complication. No other acute intracranial abnormality. No mass lesion, mass effect, or midline shift. No hydrocephalus. No extra-axial fluid collection. Pituitary gland within normal limits. Midline structures intact. Vascular: Major intracranial vascular flow voids are maintained. Skull and upper cervical spine: Craniocervical junction within normal limits. Degenerative spondylosis and facet hypertrophy at C4-5 with resultant mild spinal stenosis. Visualized upper cervical spine otherwise unremarkable. Bone marrow signal intensity within normal limits. No scalp soft tissue abnormality. Sinuses/Orbits: Globes and orbital soft tissues within normal limits. Mild scattered mucosal thickening noted within the ethmoidal air cells. Paranasal sinuses are otherwise clear. No mastoid effusion. Inner ear structures are within normal limits. Other: None. IMPRESSION: 1. Interval evolution of previously seen scattered small volume watershed type infarcts involving the bilateral cerebral hemispheres. Overall, appearance and distribution is relatively similar, and felt to be within normal limits for normal expected evolutionary changes. No hemorrhagic transformation or other complication identified. 2. Underlying atrophy with chronic microvascular ischemic disease. No other new acute intracranial abnormality. Electronically Signed   By: Jeannine Boga M.D.   On: 09/07/2019 19:26   VAS [place] TRANSCRANIAL DOPPLER W BUBBLES  Result Date: 08/30/2019  Transcranial Doppler with Bubble Indications: Stroke. Performing Technologist: Abram Sander RVS  Examination Guidelines: A complete evaluation includes B-mode imaging, spectral Doppler, color Doppler, and power Doppler as needed of all accessible portions of each vessel. Bilateral testing is considered an integral part of a complete examination. Limited examinations for reoccurring indications may be performed as noted.   Summary:  A vascular evaluation was performed. The right middle cerebral artery  was studied. An IV was inserted into the patient's right hand . Verbal informed consent was obtained.  HITS heard at rest: 1-2 Clinically insignificant. Weakly positive TCD Bubble study indiicative of a small likely clinically insignificant right to left shunt *See table(s) above for TCD measurements and observations.  Diagnosing physician: Antony Contras MD Electronically signed by Antony Contras MD on 08/30/2019 at 3:27:16 PM.    Final    ECHO TEE  Result Date: 09/14/2019   TRANSESOPHOGEAL ECHO REPORT   Patient Name:   ZAYANA MANAGO Date of Exam: 09/14/2019 Medical Rec #:  [id number]       Height:       64.0 in Accession #:    [id number]      Weight:       200.6 lb Date of Birth:  11/07/1950      BSA:          1.96 m Patient Age:    26 years        BP:           139/85 mmHg Patient Gender: F               HR:           72 bpm. Exam Location:  Inpatient  Procedure: Transesophageal Echo, Cardiac Doppler and Color Doppler Indications:     stroke  History:         Patient has prior history of Echocardiogram examinations, most                  recent 08/19/2019.  Sonographer:     Johny Chess Referring Phys:  [id number] Woodfin Ganja THOMPSON Diagnosing Phys: Oswaldo Milian MD  PROCEDURE: Patients was monitored while under deep sedation. The transesophogeal probe was passed through the esophogus of the patient. The patient developed no complications during the procedure. IMPRESSIONS  1. Left ventricular ejection fraction, by visual estimation, is 60 to 65%. The left ventricle has normal function. There is mildly increased left ventricular hypertrophy.  2. Global right ventricle has normal systolic function.The right ventricular size is normal. No increase in right ventricular wall thickness.  3. The mitral valve is normal in structure. Trivial mitral valve regurgitation.  4. The tricuspid valve is normal in structure. Tricuspid valve  regurgitation is trivial.  5. The aortic valve is tricuspid. Aortic valve regurgitation is mild.  6. The pulmonic valve was grossly normal. Pulmonic valve regurgitation is trivial.  7. Lipomatous hypertrophy of interatrial septum. Positive bubble study consistent wtih PFO. FINDINGS  Left Ventricle: Left ventricular ejection fraction, by visual estimation, is 60 to 65%. The left ventricle has normal function. The left ventricle has no regional wall motion abnormalities. There is mildly increased left ventricular hypertrophy. Right Ventricle: The right ventricular size is normal. No increase in right ventricular wall thickness. Global RV systolic function is has normal systolic function. Left Atrium: Left atrial size was normal in size. Right Atrium: Right atrial size was normal in size Prominent Chiari network and Prominent Eustachian valve. Pericardium: There is no evidence of pericardial effusion. Mitral Valve: The mitral valve is normal in structure. Trivial mitral valve regurgitation. Tricuspid Valve: The tricuspid valve is normal in structure. Tricuspid valve regurgitation is trivial. Aortic Valve: The aortic valve is tricuspid. Aortic valve regurgitation is mild. Pulmonic Valve: The pulmonic valve was grossly normal. Pulmonic valve regurgitation is trivial. Aorta: The aortic root is normal in size and structure. Shunts: Agitated saline contrast was given intravenously to evaluate for  intracardiac shunting. Saline shunting was observed within 3-6 cardiac cycles suggestive of interatrial shunt. Evidence of atrial level shunting detected by color flow Doppler.  Oswaldo Milian MD Electronically signed by Oswaldo Milian MD Signature Date/Time: 09/14/2019/7:10:52 PM    Final    CT HEAD CODE STROKE WO CONTRAST  Result Date: 09/07/2019 CLINICAL DATA:  Code stroke. Neuro deficit, acute, stroke suspected. EXAM: CT HEAD WITHOUT CONTRAST TECHNIQUE: Contiguous axial images were obtained from the base of the  skull through the vertex without intravenous contrast. COMPARISON:  CT angiogram head/neck 08/19/2019, brain MRI 08/18/2019, head CT 08/18/2019 FINDINGS: Brain: Multifocal subacute watershed infarcts again demonstrated within the bilateral cerebral hemispheres as well as a small subacute right occipital lobe infarct largely better appreciated on prior MRI 08/18/2019. No new demarcated infarct is identified. No evidence of acute intracranial hemorrhage. No evidence of intracranial mass. No midline shift or extra-axial fluid collection. Background mild ill-defined hypoattenuation within cerebral white matter is nonspecific, but consistent with chronic small vessel ischemic disease. Mild generalized parenchymal atrophy. Vascular: No hyperdense vessel.  Atherosclerotic calcifications. Skull: Normal. Negative for fracture or focal lesion. Sinuses/Orbits: Visualized orbits demonstrate no acute abnormality. Tiny left ethmoid sinus osteoma. Minimal ethmoid sinus mucosal thickening. No significant mastoid effusion. These results were called by telephone at the time of interpretation on 09/07/2019 at 5:20 pm to provider Dr. Rory Percy, who verbally acknowledged these results. IMPRESSION: Multifocal subacute watershed infarcts again demonstrated within the bilateral cerebral hemispheres as well as a small subacute right occipital lobe infarct, largely better appreciated on prior MRI 08/18/2019. No new demarcated infarct is identified. No evidence of acute intracranial hemorrhage. Stable background mild generalized parenchymal atrophy and chronic small vessel ischemic disease. Electronically Signed   By: Kellie Simmering DO   On: 09/07/2019 17:21   VAS [place] LOWER EXTREMITY VENOUS (DVT)  Result Date: 08/30/2019  Lower Venous Study Indications: Stroke.  Comparison Study: 04/06/18 Performing Technologist: Abram Sander RVS  Examination Guidelines: A complete evaluation includes B-mode imaging, spectral Doppler, color Doppler, and power  Doppler as needed of all accessible portions of each vessel. Bilateral testing is considered an integral part of a complete examination. Limited examinations for reoccurring indications may be performed as noted.  +---------+---------------+---------+-----------+----------+-----------------+ RIGHT    CompressibilityPhasicitySpontaneityPropertiesThrombus Aging    +---------+---------------+---------+-----------+----------+-----------------+ CFV      Full           Yes      Yes                                    +---------+---------------+---------+-----------+----------+-----------------+ SFJ      Full                                                           +---------+---------------+---------+-----------+----------+-----------------+ FV Prox  Full                                                           +---------+---------------+---------+-----------+----------+-----------------+ FV Mid   Full                                                           +---------+---------------+---------+-----------+----------+-----------------+  FV DistalFull                                                           +---------+---------------+---------+-----------+----------+-----------------+ PFV      Full                                                           +---------+---------------+---------+-----------+----------+-----------------+ POP      Full           Yes      Yes                                    +---------+---------------+---------+-----------+----------+-----------------+ PTV      Full                                                           +---------+---------------+---------+-----------+----------+-----------------+ PERO     None                                         Age Indeterminate +---------+---------------+---------+-----------+----------+-----------------+    +---------+---------------+---------+-----------+----------+--------------+ LEFT     CompressibilityPhasicitySpontaneityPropertiesThrombus Aging +---------+---------------+---------+-----------+----------+--------------+ CFV      Full           Yes      Yes                                 +---------+---------------+---------+-----------+----------+--------------+ SFJ      Full                                                        +---------+---------------+---------+-----------+----------+--------------+ FV Prox  Full                                                        +---------+---------------+---------+-----------+----------+--------------+ FV Mid   Full                                                        +---------+---------------+---------+-----------+----------+--------------+ FV DistalFull                                                        +---------+---------------+---------+-----------+----------+--------------+  PFV      Full                                                        +---------+---------------+---------+-----------+----------+--------------+ POP      Full           Yes      Yes                                 +---------+---------------+---------+-----------+----------+--------------+ PTV      Full                                                        +---------+---------------+---------+-----------+----------+--------------+ PERO     Full                                                        +---------+---------------+---------+-----------+----------+--------------+     Summary: Right: Findings consistent with age indeterminate deep vein thrombosis involving the right peroneal veins. No cystic structure found in the popliteal fossa. Left: There is no evidence of deep vein thrombosis in the lower extremity. No cystic structure found in the popliteal fossa.  *See table(s) above for measurements and observations.  Electronically signed by Deitra Mayo MD on 08/30/2019 at 3:49:27 PM.    Final     EKG: normal EKG, normal sinus rhythm, septal infart age indeterminate.  Physical Examination: Temp:  [97.8 F (36.6 C)-97.9 F (36.6 C)] 97.8 F (36.6 C) (02/11 0636) Pulse Rate:  [95-104] 97 (02/11 1455) Resp:  [20] 20 (02/11 1455) BP: (147-165)/(49-78) 165/49 (02/11 1455) SpO2:  [97 %-99 %] 99 % (02/11 1455)  General - Well nourished, well developed, in no apparent distress.  Ophthalmologic - fundi not visualized due to noncooperation.  Cardiovascular - Regular rate and rhythm.  Neuro - awake alert orientated to age, self place, people and year, not orientated to month.  Decreased attention and concentration, not able to back spell WORLD.  Following most of the simple commands, but still has difficulty with some simple commands.  Naming 4/5, able to repeat.  No aphasia, no dysarthria.  Right gaze preference, did not cross midline.  Left visual and sensory neglect.  Left facial droop, tongue midline.  Left upper extremity 0/5 proximal and distal, with limited shoulder ROM due to shoulder fracture.  Left lower extremity 0/5, 1/5 with pain stimulation.  Positive Babinski on the left.  Increased muscle tone around left upper extremity.  Decreased muscle tone on the left lower extremity.  Sensation subjectively symmetric, however significant sensory neglect on the left.  Right finger-to-nose intact.  Gait not tested.  Assessment:  [name] is a 69 y.o. female with history of recurrent embolic infarcts, PE and DVT who developed lethargy and worsening neuro sx on rehab.    Stroke progression - etiology not quite clear - DDx include 1. from imaging pattern concerning for hypotension / hypoperfusion - pt does have  significant stenosis at right CCA origin and ICA bulb, however pt BP in good range for the last 48 hours, only had low BP on 2/6, 2/2, 2/1 and 1/31 2. Still has possibility of further  emboli from DVT/PE in the setting of PFO even on AC 3. Hypercoagulable due to malignancy - less likely since CT chest neg, no weight loss 4. PAF - tele no afib, already on [facility], but can not completely rule out    CT head 2/10 Progressive cortical infarcts, SAH resolved. No new hemorrhage   MRI  2/11 Confluent new areas of acute/subacute infarction in the bilateral superior frontal gyri, more extensive on the right. Much smaller new area of right lateral occipital lobe infarction.  BP goal 130-160, encourage po intake, consider IVF if low BP or dehydration  Recommend to check orthostatic vitals  Continue eliquis for PE/DVT treatment, will discuss with team regarding possible IVC filter placement  Will do CT abd/pelvis to further rule out malignancy  Continue PT/OT  Hx Strokes  08/18/2019 - MRI showed R>L B anterior circulation and R occipital infarcts embolic d/t unknown source.  CTA head and neck right ICA 50% stenosis, but also right CCA proximal high-grade stenosis.  Carotid Doppler and TTE unremarkable.  LDL 69 and A1c 9.4.  Placed on aspirin and plavix x 3 weeks then plavix alone.   1/13 - CT chest showed bilateral PE  1/19 - BLE venous Doppler right age indeterminant DVT involving right peroneal veins.  TCD bubble study showed insignificant PFO  2/3 - TEE showed positive PFO  Found to have PFO as an OP. Found to have PE and started on Xarelto.  09/07/2019 - New R>L Cerebral hemisphere watershed infarcts w/ petechial hemorrhage. Embolic w/o source known. Changed Xarelto to PE dose Eliquis  09/11/2019 - NEW R frontal lobe SAH  2/3 - CIR admission  PFO  Found during TCD at follow up after first stroke  2/3 - TEE showed a positive PFO  Possible paradoxical emboli as etiology of stroke  Given stroke progression even on DOAC, may consider IVC filter placement  PE / R lower leg DVT  CT chest 08/24/2019 - Found segmental and subsegmental PE by PCP when c/o of SOB (after first  stroke admission)  LE dopplers 08/30/19 - RLE peroneal DVT age indeterminate   Placed on  Xarelto at time of PE/DVT dx ->changed to Eliquis following recurrent embolic stroke, PE dose  Continue Eliquis for DVT/PE treatment  CT chest negative for malignancy, will check CT abdomen pelvis to further rule out  May consider IVC filter placement given stroke prevention even on [facility]  Hypertension . Documented hypotension during ED stay with 2nd stroke, treated w/ multiple fluid boluses, but found significant BP fluctuation between arms with hypotension not felt to be true. Cards consulted on rehab w. Recommendations to take only in LUE.  . BP remains labile, 130-180s . Long term BP goal 130-150 give significant right CCA proximal and ICA bulb stenosis . Recommend to check orthostatic vitals. . Avoid low BP . Encourage p.o. intake . Consider IV fluid if needed  Carotid stenosis  CTA head and neck right ICA 50% stenosis, but also right CCA proximal high-grade stenosis.   Not candidate for intervention at this time  Follow up with [facility] as outpt  Avoid low BP  BP goal 130-150.   Hyperlipidemia  On lipitor 20  LDL 69, goal < 70  Continue statin   Diabetes type II, Uncontrolled  HgbA1c  9.4, goal < 7.0  On glucophage and metformin  Current glucose control good though intake remains inconsistent   PCP follow-up closely as outpatient  Other Stroke Risk Factors  Advanced age  Cigarette smoker, guit ~1 yr ago, advised to conitnue smoking cessation  Obesity, Body mass index is 34.44 kg/m., recommend weight loss, diet and exercise as appropriate   Coronary artery disease s/p PCI  Other Active Problems  CKD stage II  COPD/chronic hypoxemic respiratory failure on home O2@3L   RLS on requip and valium  On lidoderm patch for pain  Hospital day # 8   Thank you for this consultation and allowing [place] to participate in the care of this patient. Will follow up.  I had long  discussion with son at bedside, updated pt current condition, treatment plan and potential prognosis, and answered all the questions. Son expressed understanding and appreciation.    Rosalin Hawking, MD PhD Stroke Neurology 09/22/2019 6:23 PM  To contact Stroke Continuity provider, please refer to [email or web address] After hours, contact General Neurology

## 2019-09-22 NOTE — Progress Notes (Signed)
Pt back in room.

## 2019-09-22 NOTE — Progress Notes (Signed)
Called to patient room by nursing with therapy at bedside.  Patient noted to have significant right gaze preference and left neglect.  She had had a good day overall up until this episode.  Vital signs are stable.  Contact made to neurology advised MRI of the brain and again they would review yesterday CT of the head.  Current plan is to remain on Eliquis at this time and Dr.Xu will follow up today.

## 2019-09-22 NOTE — Plan of Care (Signed)
  Problem: RH Balance Goal: LTG Patient will maintain dynamic standing balance (PT) Description: LTG:  Patient will maintain dynamic standing balance with assistance during mobility activities (PT) Flowsheets (Taken 09/22/2019 1237) LTG: Pt will maintain dynamic standing balance during mobility activities with:: (LTG downgraded 2/11 due to lack of progress - AAT) Moderate Assistance - Patient 50 - 74% Note: LTG downgraded 2/11 due to lack of progress - AAT   Problem: Sit to Stand Goal: LTG:  Patient will perform sit to stand with assistance level (PT) Description: LTG:  Patient will perform sit to stand with assistance level (PT) Flowsheets (Taken 09/22/2019 1237) LTG: PT will perform sit to stand in preparation for functional mobility with assistance level: (LTG downgraded 2/11 due to lack of progress - AAT) Moderate Assistance - Patient 50 - 74% Note: LTG downgraded 2/11 due to lack of progress - AAT   Problem: RH Bed Mobility Goal: LTG Patient will perform bed mobility with assist (PT) Description: LTG: Patient will perform bed mobility with assistance, with/without cues (PT). Flowsheets (Taken 09/22/2019 1237) LTG: Pt will perform bed mobility with assistance level of: (LTG downgraded 2/11 due to lack of progress - AAT) Moderate Assistance - Patient 50 - 74% Note: LTG downgraded 2/11 due to lack of progress - AAT   Problem: RH Bed to Chair Transfers Goal: LTG Patient will perform bed/chair transfers w/assist (PT) Description: LTG: Patient will perform bed to chair transfers with assistance (PT). Flowsheets (Taken 09/22/2019 1237) LTG: Pt will perform Bed to Chair Transfers with assistance level: (LTG downgraded 2/11 due to lack of progress - AAT) Moderate Assistance - Patient 50 - 74% Note: LTG downgraded 2/11 due to lack of progress - AAT   Problem: RH Car Transfers Goal: LTG Patient will perform car transfers with assist (PT) Description: LTG: Patient will perform car transfers with  assistance (PT). Flowsheets (Taken 09/22/2019 1237) LTG: Pt will perform car transfers with assist:: (LTG downgraded 2/11 due to lack of progress - AAT) Moderate Assistance - Patient 50 - 74% Note: LTG downgraded 2/11 due to lack of progress - AAT   Problem: RH Ambulation Goal: LTG Patient will ambulate in controlled environment (PT) Description: LTG: Patient will ambulate in a controlled environment, # of feet with assistance (PT). Outcome: Not Applicable Flowsheets (Taken 09/22/2019 1237) LTG: Pt will ambulate in controlled environ  assist needed:: (LTG discontinued 2/11 due to lack of progress - AAT) -- Note: LTG discontinued 2/11 due to lack of progress - AAT

## 2019-09-22 NOTE — Progress Notes (Signed)
Ct called spoke with Wes regarding pt finished oral contrast

## 2019-09-22 NOTE — Progress Notes (Signed)
[facility] PHYSICAL MEDICINE & REHABILITATION PROGRESS NOTE   Subjective/Complaints: Much more alert this morning. Slept better with meds being resumed as noted by PA. Denies pain this morning. Feels up for therapies today  ROS: Patient denies fever, rash, sore throat, blurred vision, nausea, vomiting, diarrhea, cough, shortness of breath or chest pain, joint or back pain, headache, or mood change.      Objective:   CT HEAD WO CONTRAST  Result Date: 09/21/2019 CLINICAL DATA:  Increased lethargy and confusion EXAM: CT HEAD WITHOUT CONTRAST TECHNIQUE: Contiguous axial images were obtained from the base of the skull through the vertex without intravenous contrast. COMPARISON:  09/14/19 FINDINGS: Brain: Progressive cortical infarction is noted bilaterally particularly within the higher parietal lobe and frontal lobes. Some parietooccipital infarct is noted on the right as well. Previously seen subarachnoid hemorrhage is not well appreciated on today's exam. Vascular: No hyperdense vessel or unexpected calcification. Skull: Normal. Negative for fracture or focal lesion. Sinuses/Orbits: No acute finding. Other: None. IMPRESSION: Progressive areas of cortical infarct when compared with the study from 3 days previous. Previously seen subarachnoid hemorrhage has resolved in the interval. No new hemorrhage is seen. Electronically Signed   By: Inez Catalina M.D.   On: 09/21/2019 14:29   No results for input(s): WBC, HGB, HCT, PLT in the last 72 hours. No results for input(s): NA, K, CL, CO2, GLUCOSE, BUN, CREATININE, CALCIUM in the last 72 hours.  Intake/Output Summary (Last 24 hours) at 09/22/2019 0955 Last data filed at 09/22/2019 0845 Gross per 24 hour  Intake 220 ml  Output --  Net 220 ml     Physical Exam: Vital Signs Blood pressure (!) 157/73, pulse 95, temperature 97.8 F (36.6 C), temperature source Oral, resp. rate 20, height 5\' 4"  (1.626 m), weight 91 kg, SpO2 98 %. Constitutional: No  distress . Vital signs reviewed. HEENT: EOMI, oral membranes moist Neck: supple Cardiovascular: RRR without murmur. No JVD    Respiratory: CTA Bilaterally without wheezes or rales. Normal effort    GI: BS +, non-tender, non-distended  Musculoskeletal:     Comments: lee heel cord tight, left shoulder with crepitus and limited ROM, 1/2" sublux.  Neurological:   No focal CN abnl. Left inattention.  Motor: RUE/RLE: 5/5 proximal distal LUE: Shoulder abduction, elbow flexion/extension tr-1/5, handgrip 3-4/5 but needs tactile cueing LLE: Hip flexion tr1/5, distally 0/5 Sensation diminished to touch bilateral plantar feet and LLE, minimal sensory loss LUE. No resting tone   Skin: Skin is warm and dry.  Psychiatric:more alert and engaging  Assessment/Plan: 1. Functional deficits secondary to bicortical infarcts, Right frontal SAH which require 3+ hours per day of interdisciplinary therapy in a comprehensive inpatient rehab setting.  Physiatrist is providing close team supervision and 24 hour management of active medical problems listed below.  Physiatrist and rehab team continue to assess barriers to discharge/monitor patient progress toward functional and medical goals  Care Tool:  Bathing  Bathing activity did not occur: Refused           Bathing assist       Upper Body Dressing/Undressing Upper body dressing   What is the patient wearing?: Hospital gown only    Upper body assist Assist Level: Maximal Assistance - Patient 25 - 49%    Lower Body Dressing/Undressing Lower body dressing      What is the patient wearing?: Pants, Incontinence brief     Lower body assist Assist for lower body dressing: Total Assistance - Patient < 25%  Toileting Toileting    Toileting assist Assist for toileting: Total Assistance - Patient < 25%     Transfers Chair/bed transfer  Transfers assist     Chair/bed transfer assist level: Dependent - mechanical lift      Locomotion Ambulation   Ambulation assist   Ambulation activity did not occur: Safety/medical concerns          Walk 10 feet activity   Assist  Walk 10 feet activity did not occur: Safety/medical concerns        Walk 50 feet activity   Assist Walk 50 feet with 2 turns activity did not occur: Safety/medical concerns         Walk 150 feet activity   Assist Walk 150 feet activity did not occur: Safety/medical concerns         Walk 10 feet on uneven surface  activity   Assist Walk 10 feet on uneven surfaces activity did not occur: Safety/medical concerns         Wheelchair     Assist Will patient use wheelchair at discharge?: No(LT goals not set )             Wheelchair 50 feet with 2 turns activity    Assist            Wheelchair 150 feet activity     Assist          Blood pressure (!) 157/73, pulse 95, temperature 97.8 F (36.6 C), temperature source Oral, resp. rate 20, height 5\' 4"  (1.626 m), weight 91 kg, SpO2 98 %.  Medical Problem List and Plan: 1.  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 Patient with bilateral cerebral and right occipital infarcts last week as well as pulmonary emboli with DVT.             -patient may shower              -Patient continue CIR therapies including PT, OT, and SLP   -PRAFO LLE--not sure this is being placed each night 2. DVT/anticoagulation: Eliquis             -antiplatelet therapy: N/A 3. Pain Management: Lidoderm patch change as directed  -hydrocodone stopped d/t ams  -sports cream  -try low dose tramadol 25mg  q8 prn  -left RTC/OA shoulder: had left shoulder injection 2 mos ago apparently   -pain worsened by left HP, capsulitis   -consider another injection   -in the meantime shoulder ROM and support 4. Mood: Valium as needed for anxiety and depression             -antipsychotic agents: N/A 5.  Neuropsych: This patient is capable of making decisions on her own behalf. 6. Skin/Wound Care: Routine skin checks 7. Fluids/Electrolytes/Nutrition: encourage PO  -2/8 K+ 3.6 8.  PFO.  Follow cardiology services.  TEE completed, suggesting PFO, await further Cards recs.. 9.  Hypertension.  Cardizem 120 mg daily, Lasix 20 mg daily, Cozaar 50 mg daily            2/9  SBP still a little high  2/11 still some elevation of SBP but inconsistent.    No changes to regimen today 10.  Hyperlipidemia: Continue Lipitor 11.  Diabetes mellitus with hyperglycemia.  Hemoglobin A1c 9.4.  SSI.  Patient on Glucophage 1000 mg twice daily prior to admission.  Resume as needed            2/5 cbg's with intermittent  elevation. Resume metformin at 250mg  bid  2/6- BGs 150s-206- will con't metformin since only been 24 hours- will monitor  2/7- BGs 130s-170s- will increase Metformin to 500 mg BID  2/9 cbg's under better control, intake inconsistent though  2/11: adequate control at present 12.  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  Continue nebulizer treatments.     -tolerating therapies thus far 13.  CKD stage II.                2/8 BUN/Cr 8/0.6 14.  Restless leg syndrome.  Requip 2 mg 3 times daily--Reduce to HS as she was only using HS at home   2/11 home requip resumed d/t improved arousal.  15. BP difference between arms?  2/7- consulted Cardiology- he felt there were no interventions but have them take BP in LUE from now on- order placed.  16. AMS: UA -, UCX pending, not having frank diarrhea   2/9-afebrile today  2/11: pt now more alert after drug holiday   -try to limit hydrocodone as possible   -requip and prn 2mg  valium resumed   -HCT reviewed and shows improvement of hemorrhage and expected evolution of infarcts   -continue to monitor    Greater than 35 total minutes was spent in examination of patient, assessment of pertinent data,  formulation of  a treatment plan, and in discussion with patient and/or family.     LOS: 8 days A FACE TO FACE EVALUATION WAS PERFORMED  Meredith Staggers 09/22/2019, 9:55 AM

## 2019-09-22 NOTE — Progress Notes (Signed)
Pt in therapy; unable to turn head to left with eyes staying to right of midline, pt responds verbally but difficult to follow commands at this time, Tramadol 25mg  administered for pain at lunch, notified Dan, new orders pending. Pt placed in bed with call light in reach. VS 165/49, HR 97, O2 99% RA, RR 20

## 2019-09-22 NOTE — Progress Notes (Signed)
Occupational Therapy Weekly Progress Note  Patient Details  Name: [name] [name] MRN: [id number] Date of Birth: [birth year]  Beginning of progress report period: August 15, 2019 End of progress report period: August 22, 2019  Today's Date: 09/22/2019 OT Individual Time: 1415-1450 OT Individual Time Calculation (min): 35 min    Patient has met 1 of 3 short term goals.  Pt is making slow progress this reporting period with increased need for +2 A during all mobility and ADL tasks d/t increased restlessness/motor planning deficits/perseverative motor movements. Restlessness being evaluated by medical team. Pt initially demo mil improvement in L attention during self feeding and grooming tasks, however during end of reporting period midline shift to R gaze preference. Sitting balance requires MOD A-total A at times with L/anterior lean and standing/transfers requiring +2 A in stedy lift.   Patient continues to demonstrate the following deficits: muscle weakness, decreased cardiorespiratoy endurance, impaired timing and sequencing, unbalanced muscle activation, motor apraxia, ataxia, decreased coordination and decreased motor planning, decreased midline orientation, decreased attention to left and ideational apraxia, decreased initiation, decreased attention, decreased awareness, decreased problem solving, decreased safety awareness, decreased memory and delayed processing and decreased sitting balance, decreased standing balance, decreased postural control, hemiplegia and decreased balance strategies and therefore will continue to benefit from skilled OT intervention to enhance overall performance with BADL and iADL.  Patient progressing toward long term goals..  Continue plan of care.  OT Short Term Goals Week 1:  OT Short Term Goal 1 (Week 1): Pt will complete sit<>stand with consistent mod A in preparation for BADL tasks OT Short Term Goal 1 - Progress (Week 1): Not met OT Short Term Goal 2  (Week 1): Pt will locate 2 grooming items on L side of the sink with min questioing cues OT Short Term Goal 2 - Progress (Week 1): Met OT Short Term Goal 3 (Week 1): Pt will complete 1 step of UB dressing task without verbal cues OT Short Term Goal 3 - Progress (Week 1): Progressing toward goal Week 2:  OT Short Term Goal 1 (Week 2): Pt will complete 1 step of UB dressing to demo improved apraxia OT Short Term Goal 2 (Week 2): Pt will sit EOB 5 min wiht MIN A for sitting balnace OT Short Term Goal 3 (Week 2): Pt will locate items on sink/tray on L with no more than 1 VC OT Short Term Goal 4 (Week 2): Pt will sit to stand in stedy wiht MAX A of 1 to decrease BOC  Skilled Therapeutic Interventions/Progress Updates:    1:1. Pt received in TIS agreeable to OT. Pt able to locate therapist on L with Vc for turning head and eyes. Pt completes sit to stand in stedy with MAX A of 2 to check brief (clean) and transfer to EOM. Pt with significant retropulsion/extension tone and OT determines unsafe to sit EOM. Returned to [employer] as stated above with RN alerted to onset of R gaze preference/midline shift. Pt unable to locate OT at midline or L. Pt returned to bed and PA alerted. Vitals taken as OT exiting room.   Therapy Documentation Precautions:  Precautions Precautions: Fall Precaution Comments: recent L humeral fx, no orders Restrictions Weight Bearing Restrictions: No General:   Vital Signs: Therapy Vitals Temp: 97.8 F (36.6 C) Temp Source: Oral Pulse Rate: 95 BP: (!) 157/73 Patient Position (if appropriate): Lying Oxygen Therapy SpO2: 98 % O2 Device: Room Air Pain:   ADL: ADL Eating: Minimal assistance Grooming: Minimal assistance  Upper Body Bathing: Not assessed Lower Body Bathing: Not assessed Upper Body Dressing: Maximal assistance Lower Body Dressing: Maximal assistance, Dependent Toileting: Maximal assistance Toilet Transfer: Dependent, Maximal assistance Vision    Perception    Praxis   Exercises:   Other Treatments:     Therapy/Group: Individual Therapy  Tonny Branch 09/22/2019, 6:49 AM

## 2019-09-22 NOTE — Progress Notes (Signed)
Speech Language Pathology Weekly Progress and Session Note  Patient Details  Name: [name] [name] MRN: [id number] Date of Birth: 22-Apr-1951  Beginning of progress report period: September 15, 2019 End of progress report period: September 22, 2019  Today's Date: 09/22/2019 SLP Individual Time: 1100-1153 SLP Individual Time Calculation (min): 53 min  Short Term Goals: Week 1: SLP Short Term Goal 1 (Week 1): Pt will sustain attention to tasks for 5 minute intervals with Mod A cues for redirection. SLP Short Term Goal 1 - Progress (Week 1): Not met SLP Short Term Goal 2 (Week 1): Pt will recall new and/or complex daily information with Mod A verbal/visual cues for use of compensatory strategies. SLP Short Term Goal 2 - Progress (Week 1): Not met SLP Short Term Goal 3 (Week 1): Pt will demonstrate problem solving during basic tasks with Mod A verbal/visual cues. SLP Short Term Goal 3 - Progress (Week 1): Not met SLP Short Term Goal 4 (Week 1): Pt will demonstrate emergent awareness by detecting errors during functional tasks with Mod A verbal/visual cues. SLP Short Term Goal 4 - Progress (Week 1): Not met    New Short Term Goals: Week 2: SLP Short Term Goal 1 (Week 2): Pt will sustain attention to tasks for 5 minute intervals with Mod A cues for redirection. SLP Short Term Goal 2 (Week 2): Pt will recall new and/or complex daily information with Mod A verbal/visual cues for use of compensatory strategies. SLP Short Term Goal 3 (Week 2): Pt will demonstrate problem solving during basic tasks with Mod A verbal/visual cues. SLP Short Term Goal 4 (Week 2): Pt will demonstrate emergent awareness by detecting errors during functional tasks with Max A verbal/visual cues. SLP Short Term Goal 5 (Week 2): Patient will scan to her left field of envionrment during functional tasks with Max A multimodal cues.  Weekly Progress Updates: Patient has made minimal gains and has not met any STGs this reporting  period. Currently, patient requires overall Max-Total A to complete functional and familiar tasks safely in regards to sustained attention, recall with use of strategies, scanning to the left field of environment, problem solving and awareness. Patient consistently appears more cognitively intact during a verbal conversation, however, demonstrates severe impairments during any functional and familiar task. Suspect function has been impacted by patient's severe restless leg syndrome resulting in generalized restlessness and inability to attend to any task effectively. Patient and family education ongoing. Patient would benefit from continued skilled SLP intervention to maximize her cognitive functioning and overall functional independence prior to discharge.    Intensity: Minumum of 1-2 x/day, 30 to 90 minutes Frequency: 3 to 5 out of 7 days Duration/Length of Stay: 3-4 weeks Treatment/Interventions: Cognitive remediation/compensation;Cueing hierarchy;Functional tasks;Internal/external aids;Patient/family education;Therapeutic Activities;Environmental controls   Daily Session  Skilled Therapeutic Interventions: Skilled treatment session focused on cognitive goals. SLP facilitated session by providing Max-Total A multimodal cues for initiation of both functional and novel tasks. SLP utilized hand over hand assist along with time constraints, all of which were unsuccessful. Patient maintained attention to task for ~30-60 seconds despite decreased restlessness today. Patient reported she "can't do anything because her head is moving so fast" but was unable to elaborate. Patient left upright in tilt-in-space wheelchair with alarm on and all needs within reach. Continue with current plan of care.     Pain No/Denies Pain   Therapy/Group: Individual Therapy  Kalvin Buss 09/22/2019, 6:35 AM

## 2019-09-23 ENCOUNTER — Inpatient Hospital Stay (HOSPITAL_COMMUNITY): Payer: Medicare Other | Admitting: Speech Pathology

## 2019-09-23 ENCOUNTER — Inpatient Hospital Stay (HOSPITAL_COMMUNITY): Payer: Medicare Other

## 2019-09-23 ENCOUNTER — Inpatient Hospital Stay (HOSPITAL_COMMUNITY): Payer: Medicare Other | Admitting: Physical Therapy

## 2019-09-23 ENCOUNTER — Other Ambulatory Visit: Payer: Self-pay | Admitting: *Deleted

## 2019-09-23 LAB — BASIC METABOLIC PANEL WITH GFR
Anion gap: 14 (ref 5–15)
BUN: 28 mg/dL — ABNORMAL HIGH (ref 8–23)
CO2: 20 mmol/L — ABNORMAL LOW (ref 22–32)
Calcium: 9.4 mg/dL (ref 8.9–10.3)
Chloride: 97 mmol/L — ABNORMAL LOW (ref 98–111)
Creatinine, Ser: 0.87 mg/dL (ref 0.44–1.00)
GFR calc Af Amer: >60 mL/min
GFR calc non Af Amer: >60 mL/min
Glucose, Bld: 158 mg/dL — ABNORMAL HIGH (ref 70–99)
Potassium: 3.7 mmol/L (ref 3.5–5.1)
Sodium: 131 mmol/L — ABNORMAL LOW (ref 135–145)

## 2019-09-23 LAB — GLUCOSE, CAPILLARY
Glucose-Capillary: 142 mg/dL — ABNORMAL HIGH (ref 70–99)
Glucose-Capillary: 141 mg/dL — ABNORMAL HIGH (ref 70–99)
Glucose-Capillary: 146 mg/dL — ABNORMAL HIGH (ref 70–99)
Glucose-Capillary: 130 mg/dL — ABNORMAL HIGH (ref 70–99)

## 2019-09-23 NOTE — Patient Outreach (Addendum)
[facility]) Care Management  09/23/2019  [name] [name] 1950-09-25 [id number]   Per chart review, patient admitted to inpatient rehab on 09/14/2019, and remains inpatient.  No patient outreach at this time due to inpatient status, and will close case to this RNCM, since patient remains inpatient, being followed for discharge disposition / discharge planning.Marland Kitchen  Update sent to [name] ( [name], [name], and [name]) and [facility] [employer] Social Worker Museum/gallery conservator [name] via [place].     [name], BSN, [facility] Telephonic CM Phone: [phone] Fax: [phone]

## 2019-09-23 NOTE — Progress Notes (Signed)
Occupational Therapy Session Note  Patient Details  Name: [name] [name] MRN: [id number] Date of Birth: 07/31/1951  Today's Date: 09/23/2019 OT Individual Time: 1500-1555 OT Individual Time Calculation (min): 55 min    Short Term Goals: Week 1:  OT Short Term Goal 1 (Week 1): Pt will complete sit<>stand with consistent mod A in preparation for BADL tasks OT Short Term Goal 1 - Progress (Week 1): Not met OT Short Term Goal 2 (Week 1): Pt will locate 2 grooming items on L side of the sink with min questioing cues OT Short Term Goal 2 - Progress (Week 1): Met OT Short Term Goal 3 (Week 1): Pt will complete 1 step of UB dressing task without verbal cues OT Short Term Goal 3 - Progress (Week 1): Progressing toward goal  Skilled Therapeutic Interventions/Progress Updates:    1:1. Pt received in TIS agreeable ot OT with no report of pain. Pt with little verbal output besides, "yeah." Pt completes total A+2 sit to stand in stedy to transfer from TIS to EOM. Pt demo little to no initiation of any movement/visual scanning this date and unable to visually fixate on any object placed in front of patient. Attempted to engage pt in making valentine while seated EOM for granddaughter by placing stickers onto card at midline, however despite HOH A pt only taps tabletop with hand with sticker stuck to hand. Despite HOH A pt unable to hold pen or attempt to sign name as pt shifts pen back and forth with repetitive movements. Pt demo increased sensory seeking movements this date( ie. Swinging RLE, tapping table, rubbing stomach). Pt requires overall max-total A for sitting balance at EOM with LOB in B directions and posteriorly. Pt unable to engage in reaching task to locate cones on R or at midline despite manual facilitation of head turns. Pt returned to room/bed via stedy and requires total A +2 to roll in B directions for OT to check brief for incontinent stool, change brief, cleanse and don new brief. Pt  unable to initate or sequence or follow commands to roll in either direction. Exited session with pt seated in bed, exit alarm on and call light in reach  Therapy Documentation Precautions:  Precautions Precautions: Fall Precaution Comments: recent L humeral fx, no orders Restrictions Weight Bearing Restrictions: No General:   Vital Signs: Therapy Vitals Temp: 98.2 F (36.8 C) Temp Source: Oral Pulse Rate: 93 Resp: 17 BP: (!) 160/54 Patient Position (if appropriate): Sitting Oxygen Therapy SpO2: 93 % O2 Device: Room Air Pain: Pain Assessment Pain Scale: 0-10 Pain Score: 7  Pain Type: Acute pain Pain Location: Head Pain Descriptors / Indicators: Headache Pain Intervention(s): Medication (See eMAR) ADL: ADL Eating: Minimal assistance Grooming: Minimal assistance Upper Body Bathing: Not assessed Lower Body Bathing: Not assessed Upper Body Dressing: Maximal assistance Lower Body Dressing: Maximal assistance, Dependent Toileting: Maximal assistance Toilet Transfer: Dependent, Maximal assistance Vision   Perception    Praxis   Exercises:   Other Treatments:     Therapy/Group: Individual Therapy  Stephanie M Schlosser 09/23/2019, 3:57 PM 

## 2019-09-23 NOTE — Progress Notes (Signed)
Speech Language Pathology Daily Make-Up Session Note  Patient Details  Name: [name] [name] MRN: [id number] Date of Birth: [birth year]  Today's Date: 09/23/2019 SLP Individual Time: 1407-1430 SLP Individual Time Calculation (min): 23 min  Short Term Goals: Week 2: SLP Short Term Goal 1 (Week 2): Pt will sustain attention to tasks for 5 minute intervals with Mod A cues for redirection. SLP Short Term Goal 2 (Week 2): Pt will recall new and/or complex daily information with Mod A verbal/visual cues for use of compensatory strategies. SLP Short Term Goal 3 (Week 2): Pt will demonstrate problem solving during basic tasks with Mod A verbal/visual cues. SLP Short Term Goal 4 (Week 2): Pt will demonstrate emergent awareness by detecting errors during functional tasks with Max A verbal/visual cues. SLP Short Term Goal 5 (Week 2): Patient will scan to her left field of envionrment during functional tasks with Max A multimodal cues.  Skilled Therapeutic Interventions: Skilled treatment session focused on cognitive goals. SLP facilitated session by providing total A for initiation while asking patient to choose a specific item from a field of 2. Patient was able to name the items on the table but was unable to initiate picking up a specific item without hand over hand assist. Patient was handed a color pencil in attempt to initiate coloring on a sheet. Patient initiated task and demonstrated focused attention to task for ~30 seconds then attempted to color on her forehead. Patient left upright in wheelchair with alarm on and all needs within reach. Continue with current plan of care.      Pain No/Denies Pain   Therapy/Group: Individual Therapy  [name] 09/23/2019, 3:01 PM

## 2019-09-23 NOTE — Progress Notes (Signed)
Physical Therapy Session Note  Patient Details  Name: [name] [name] MRN: [id number] Date of Birth: [birth year]  Today's Date: 09/23/2019 PT Individual Time: [id number] PT Individual Time Calculation (min): 72 min   Short Term Goals: Week 2:  PT Short Term Goal 1 (Week 2): Pt will perform bed mobility w/ max assist +1 PT Short Term Goal 2 (Week 2): Pt will perform sit<>stand w/ max assist +1 PT Short Term Goal 3 (Week 2): Pt will maintain static sitting balance w/ CGA PT Short Term Goal 4 (Week 2): Pt will initiate functional mobility w/ min cues 50% of the time  Skilled Therapeutic Interventions/Progress Updates: Pt presents supine in bed and agreeable to participate in therapy.  Pt performed multiple rolls side to side in bed w/ total assist and verbal and manual assist to flex left knee for hook-lying position and hand over hand for use of right hand on side rail.  Pt incontinent of stool and required clean-up w/ assist of rehab tech.  Pt assisted w/ donning of pants dependent for left LE but participates w/ right.  Pt HOB elevated to visualize pulling up pants.  Attempted bridging to pull pants up but minimal success.  Pt again rolled for pants and to pull down shirt.  Pt required 2 person assist for sup to sit transfer and then scoot to EOB.  Pt sat EOB x 5-7' w/ initial assist of mod A to maintain balance and decreased to occasional CGA.  Pt performed sit to stand w/ 2 person assist for [facility] lift to TIS.Pt does participate w/ sit to stand using right extremities.  Pt performed seated reaching and stacking cones w/ mod A for forward lean and hand over hand 70% of time for right UE use, constant re-direction to left.  Pt performed standing in Stedy lift for peg board w/ facilitation for posture and left UE placement on lift handle.  Pt lists to left.  Pt tolerated manual ROM for c/spine rotation to left w/ improved mobility and decreased c/o pain left c/spine.  Pt left in TIS tilted back w/ chair  alarm on and all needs with/in reach.     Therapy Documentation Precautions:  Precautions Precautions: Fall Precaution Comments: recent L humeral fx, no orders Restrictions Weight Bearing Restrictions: No General:   Vital Signs:  Pain:  Pt states pain in left shoulder w/ left side-lying, but unable to quantify level.      Therapy/Group: Individual Therapy  Ladoris Gene 09/23/2019, 12:02 PM

## 2019-09-23 NOTE — Progress Notes (Signed)
STROKE TEAM PROGRESS NOTE   INTERVAL HISTORY Patient was seen in the gym working with PT.  Patient sitting along with PT, still has right gaze preference, barely cross midline.  Still has significant left-sided weakness.  Discussed with Dr. Naaman Plummer, will consider IVC filter.  Vitals:   09/22/19 1940 09/23/19 0021 09/23/19 0339 09/23/19 1441  BP: (!) 156/44 125/76 (!) 157/55 (!) 160/54  Pulse: 100 80 78 93  Resp: 18 18 18 17   Temp: 97.8 F (36.6 C) 98 F (36.7 C) 98.7 F (37.1 C) 98.2 F (36.8 C)  TempSrc: Oral Oral Oral Oral  SpO2: 95% 94% 98% 93%  Weight:      Height:       CBC:  Recent Labs  Lab 09/19/19 0508  WBC 10.1  NEUTROABS 5.9  HGB 13.5  HCT 41.3  MCV 87.5  PLT [id number]   Basic Metabolic Panel:  Recent Labs  Lab 09/19/19 0508  NA 138  K 3.6  CL 102  CO2 27  GLUCOSE 168*  BUN 8  CREATININE 0.64  CALCIUM 9.6   IMAGING past 24 hours MR BRAIN WO CONTRAST  Result Date: 09/22/2019 CLINICAL DATA:  69 year old female status post widespread scattered and watershed infarcts in both cerebral hemispheres last month. New onset left side neglect and right side gaze while in therapy. EXAM: MRI HEAD WITHOUT CONTRAST TECHNIQUE: Multiplanar, multiecho pulse sequences of the brain and surrounding structures were obtained without intravenous contrast. COMPARISON:  Brain MRI 09/11/2019 and earlier. FINDINGS: The examination had to be discontinued prior to completion due to patient altered mental status. Diagnostic diffusion-weighted imaging plus axial T2, FLAIR, sagittal T1 and coronal T2 weighted imaging was obtained. Brain: Confluent new areas of restricted diffusion in the right middle and bilateral superior frontal gyri when compared to 09/11/2019 (series 3, image 40). Confluent superior right perirolandic involvement. Much smaller new or increased area of right lateral occipital lobe diffusion restriction on series 3, image 26. No deep gray nuclei, brainstem or cerebellar  involvement. Confluent cytotoxic edema in the new areas of ischemia. No midline shift or significant intracranial mass effect. Basilar cisterns remain patent. No ventriculomegaly. Expected evolution of the January ischemic disease. No new signal abnormality in the deep gray nuclei, brainstem or cerebellum. Pituitary and cervicomedullary junction appear to remain normal. Vascular: Major intracranial vascular flow voids are preserved and stable. Skull and upper cervical spine: Grossly negative visible cervical spine. Normal bone marrow signal. Sinuses/Orbits: Negative orbits. Paranasal sinuses and mastoids are stable and well pneumatized. Other: Scalp and face soft tissues appear negative. IMPRESSION: 1. Confluent new areas of acute/subacute infarction in the bilateral superior frontal gyri, more extensive on the right. Much smaller new area of right lateral occipital lobe infarction. 2. Cytotoxic edema with no evidence of hemorrhage hemorrhage, and no significant intracranial mass effect. 3. The examination had to be discontinued prior to completion due to patient altered mental status. Electronically Signed   By: Genevie Ann M.D.   On: 09/22/2019 17:09   CT ABDOMEN PELVIS W CONTRAST  Result Date: 09/22/2019 CLINICAL DATA:  Stroke, hypercoagulability EXAM: CT ABDOMEN AND PELVIS WITH CONTRAST TECHNIQUE: Multidetector CT imaging of the abdomen and pelvis was performed using the standard protocol following bolus administration of intravenous contrast. CONTRAST:  124mL OMNIPAQUE IOHEXOL 300 MG/ML  SOLN COMPARISON:  08/23/2019 FINDINGS: Lower chest: No acute pleural or parenchymal lung disease. Hepatobiliary: No focal liver abnormality is seen. Status post cholecystectomy. No biliary dilatation. Pancreas: Unremarkable. No pancreatic ductal dilatation or surrounding  inflammatory changes. Spleen: Normal in size without focal abnormality. Adrenals/Urinary Tract: There is a 3 mm nonobstructing calculus lower pole left kidney.  The right kidney is unremarkable. Ureters and bladder are unremarkable. The adrenals appear normal. Stomach/Bowel: No bowel obstruction or ileus. Normal appendix right lower quadrant. Vascular/Lymphatic: There is moderate diffuse atherosclerosis throughout the abdominal aorta. No pathologic mediastinal or hilar adenopathy. Reproductive: Uterus and bilateral adnexa are unremarkable. Other: No abdominal wall hernia or abnormality. No abdominopelvic ascites. Musculoskeletal: No acute or destructive bony lesions. Reconstructed images demonstrate no additional findings. IMPRESSION: 1. No acute intra-abdominal process. 2. 3 mm nonobstructing calculus lower pole left kidney. 3. Moderate diffuse atherosclerosis throughout the abdominal aorta. Electronically Signed   By: Randa Ngo M.D.   On: 09/22/2019 23:49     PHYSICAL EXAM   General - Well nourished, well developed, in no apparent distress.  Ophthalmologic - fundi not visualized due to noncooperation.  Cardiovascular - Regular rate and rhythm.  Neuro - awake alert but lethargic, orientated to age, self place, people and year, not orientated to month. Following limited simple commands.  Naming 4/5, able to repeat.  No aphasia, no dysarthria.  Right gaze preference, barely cross midline.  Left visual and sensory neglect.  Left facial droop, tongue midline.  Left upper extremity 0/5 proximal and distal, with limited shoulder ROM due to shoulder fracture.  Left lower extremity 0/5, 1/5 with pain stimulation.  Positive Babinski on the left.  Increased muscle tone around left upper extremity.  Decreased muscle tone on the left lower extremity.  Sensation subjectively symmetric, however significant sensory neglect on the left.  Right finger-to-nose intact.  Gait not tested.  ASSESSMENT/PLAN [name] [name] is a 69 y.o. female with history of recurrent embolic infarcts, PE and DVT who developed lethargy and worsening neuro sx on rehab.    Stroke  progression - etiology not quite clear - possible hypotension / hypoperfusion vs. further emboli from DVT/PE/PFO even on [facility]   CT head 2/10 Progressive cortical infarcts, SAH resolved. No new hemorrhage   MRI  2/11 Confluent new areas of acute/subacute infarction in the bilateral superior frontal gyri, more extensive on the right. Much smaller new area of right lateral occipital lobe infarction.  CT abd/pelvis neg for malignancy  BP goal 130-160, encourage po intake  Continue eliquis for PE/DVT treatment  Discussed with Dr. Naaman Plummer, plan for IVC filter placement - NPO after midnight  Continue PT/OT  Hx Strokes  08/18/2019 - MRI showed R>L B anterior circulation and R occipital infarcts embolic d/t unknown source.  CTA head and neck right ICA 50% stenosis, but also right CCA proximal high-grade stenosis.  Carotid Doppler and TTE unremarkable.  LDL 69 and A1c 9.4.  Placed on aspirin and plavix x 3 weeks then plavix alone.   1/13 - CT chest showed bilateral PE  1/19 - BLE venous Doppler right age indeterminant DVT involving right peroneal veins.  TCD bubble study showed insignificant PFO  2/3 - TEE showed positive PFO  Found to have PFO as an OP. Found to have PE and started on Xarelto.  09/07/2019 - New R>L Cerebral hemisphere watershed infarcts w/ petechial hemorrhage. Embolic w/o source known. Changed Xarelto to PE dose Eliquis  09/11/2019 - NEW R frontal lobe SAH  2/3 - CIR admission  2/11 - CT abd/pel - no malignancy  PFO  Found during TCD at follow up after first stroke  2/3 - TEE showed a positive PFO  Possible paradoxical emboli as  etiology of stroke  Given stroke progression even on DOAC  Discussed with Dr. Naaman Plummer, plan for IVC filter placement - NPO after midnight  PE / R lower leg DVT  CT chest 08/24/2019 - Found segmental and subsegmental PE by PCP when c/o of SOB (after first stroke admission)  LE dopplers 08/30/19 - RLE peroneal DVT age indeterminate    Placed on  Xarelto at time of PE/DVT dx ->changed to Eliquis following recurrent embolic stroke, PE dose  Continue Eliquis for DVT/PE treatment  CT chest/abd/pelvis negative for malignancy  Plan  IVC filter placement given stroke progression even on [facility]  Hypertension  Documented hypotension during ED stay with 2nd stroke, treated w/ multiple fluid boluses, but found significant BP fluctuation between arms with hypotension not felt to be true. Cards consulted on rehab w. Recommendations to take only in LUE.   BP remains labile, 130-180s  Long term BP goal 130-150 given significant right CCA proximal and ICA bulb stenosis  Avoid low BP  Encourage p.o. intake  Consider IV fluid if needed  Carotid stenosis  CTA head and neck right ICA 50% stenosis, but also right CCA proximal high-grade stenosis.   Not candidate for intervention at this time  Follow up with [facility] as outpt  Avoid low BP  BP long term goal 130-150   Hyperlipidemia  On lipitor 20  LDL 69, goal < 70  Continue statin   Diabetes type II, Uncontrolled  HgbA1c 9.4, goal < 7.0  On glucophage and metformin  Current glucose control good though intake remains inconsistent   PCP follow-up closely as outpatient  Other Stroke Risk Factors  Advanced age  Cigarette smoker, guit ~1 yr ago, advised to conitnue smoking cessation  Obesity, Body mass index is 34.44 kg/m., recommend weight loss, diet and exercise as appropriate   Coronary artery disease s/p PCI  Other Active Problems  CKD stage II  COPD/chronic hypoxemic respiratory failure on home O2@3L   RLS on requip and valium  On lidoderm patch for pain  Neurology will sign off. Please call with questions. Pt will follow up with stroke clinic NP at [facility] in about 4 weeks. Thanks for the consult.  [name] Hawking, MD PhD Stroke Neurology 09/23/2019 6:13 PM  To contact Stroke Continuity provider, please refer to [email or web address] After hours, contact  General Neurology

## 2019-09-23 NOTE — Progress Notes (Signed)
Speech Language Pathology Daily Session Note  Patient Details  Name: [name] [name] MRN: [id number] Date of Birth: 1951-06-15  Today's Date: 09/23/2019 SLP Individual Time: [id number] SLP Individual Time Calculation (min): 55 min  Short Term Goals: Week 2: SLP Short Term Goal 1 (Week 2): Pt will sustain attention to tasks for 5 minute intervals with Mod A cues for redirection. SLP Short Term Goal 2 (Week 2): Pt will recall new and/or complex daily information with Mod A verbal/visual cues for use of compensatory strategies. SLP Short Term Goal 3 (Week 2): Pt will demonstrate problem solving during basic tasks with Mod A verbal/visual cues. SLP Short Term Goal 4 (Week 2): Pt will demonstrate emergent awareness by detecting errors during functional tasks with Max A verbal/visual cues. SLP Short Term Goal 5 (Week 2): Patient will scan to her left field of envionrment during functional tasks with Max A multimodal cues.  Skilled Therapeutic Interventions: Skilled treatment session focused on cognitive goals. Upon arrival, patient was sitting upright in bed with her breakfast tray in front of her. She had spilled water and was chewing on a piece of plastic from her coffee lid. Patient expectorated plastic per SLP's request. SLP left room for a new gown and upon returning the room, the patient had taken a straw from her bedside table and was chewing on the paper/plastic which had to be removed by SLP. Recommend full supervision with meals due to patient's declining cognitive functioning impacting her safety. SLP also facilitated session by providing total hand over hand assist for initiation and problem solving during a basic card sorting task from a field of 2. Patient was oriented to place and situation but required total A for orientation to month. Patient left upright in bed with alarm on and all needs within reach. Continue with current plan of care.      Pain No/Denies Pain   Therapy/Group:  Individual Therapy  Jolan Upchurch 09/23/2019, 2:54 PM

## 2019-09-23 NOTE — Progress Notes (Signed)
[facility] PHYSICAL MEDICINE & REHABILITATION PROGRESS NOTE   Subjective/Complaints: Up in bed chewing on straw/paper wrapper. No distress. Says she slept.   ROS: Patient denies fever, rash, sore throat, blurred vision, nausea, vomiting, diarrhea, cough, shortness of breath or chest pain, joint or back pain, headache,       Objective:   CT HEAD WO CONTRAST  Result Date: 09/21/2019 CLINICAL DATA:  Increased lethargy and confusion EXAM: CT HEAD WITHOUT CONTRAST TECHNIQUE: Contiguous axial images were obtained from the base of the skull through the vertex without intravenous contrast. COMPARISON:  09/14/19 FINDINGS: Brain: Progressive cortical infarction is noted bilaterally particularly within the higher parietal lobe and frontal lobes. Some parietooccipital infarct is noted on the right as well. Previously seen subarachnoid hemorrhage is not well appreciated on today's exam. Vascular: No hyperdense vessel or unexpected calcification. Skull: Normal. Negative for fracture or focal lesion. Sinuses/Orbits: No acute finding. Other: None. IMPRESSION: Progressive areas of cortical infarct when compared with the study from 3 days previous. Previously seen subarachnoid hemorrhage has resolved in the interval. No new hemorrhage is seen. Electronically Signed   By: Inez Catalina M.D.   On: 09/21/2019 14:29   MR BRAIN WO CONTRAST  Result Date: 09/22/2019 CLINICAL DATA:  69 year old female status post widespread scattered and watershed infarcts in both cerebral hemispheres last month. New onset left side neglect and right side gaze while in therapy. EXAM: MRI HEAD WITHOUT CONTRAST TECHNIQUE: Multiplanar, multiecho pulse sequences of the brain and surrounding structures were obtained without intravenous contrast. COMPARISON:  Brain MRI 09/11/2019 and earlier. FINDINGS: The examination had to be discontinued prior to completion due to patient altered mental status. Diagnostic diffusion-weighted imaging plus axial  T2, FLAIR, sagittal T1 and coronal T2 weighted imaging was obtained. Brain: Confluent new areas of restricted diffusion in the right middle and bilateral superior frontal gyri when compared to 09/11/2019 (series 3, image 40). Confluent superior right perirolandic involvement. Much smaller new or increased area of right lateral occipital lobe diffusion restriction on series 3, image 26. No deep gray nuclei, brainstem or cerebellar involvement. Confluent cytotoxic edema in the new areas of ischemia. No midline shift or significant intracranial mass effect. Basilar cisterns remain patent. No ventriculomegaly. Expected evolution of the January ischemic disease. No new signal abnormality in the deep gray nuclei, brainstem or cerebellum. Pituitary and cervicomedullary junction appear to remain normal. Vascular: Major intracranial vascular flow voids are preserved and stable. Skull and upper cervical spine: Grossly negative visible cervical spine. Normal bone marrow signal. Sinuses/Orbits: Negative orbits. Paranasal sinuses and mastoids are stable and well pneumatized. Other: Scalp and face soft tissues appear negative. IMPRESSION: 1. Confluent new areas of acute/subacute infarction in the bilateral superior frontal gyri, more extensive on the right. Much smaller new area of right lateral occipital lobe infarction. 2. Cytotoxic edema with no evidence of hemorrhage hemorrhage, and no significant intracranial mass effect. 3. The examination had to be discontinued prior to completion due to patient altered mental status. Electronically Signed   By: Genevie Ann M.D.   On: 09/22/2019 17:09   CT ABDOMEN PELVIS W CONTRAST  Result Date: 09/22/2019 CLINICAL DATA:  Stroke, hypercoagulability EXAM: CT ABDOMEN AND PELVIS WITH CONTRAST TECHNIQUE: Multidetector CT imaging of the abdomen and pelvis was performed using the standard protocol following bolus administration of intravenous contrast. CONTRAST:  143mL OMNIPAQUE IOHEXOL 300  MG/ML  SOLN COMPARISON:  08/23/2019 FINDINGS: Lower chest: No acute pleural or parenchymal lung disease. Hepatobiliary: No focal liver abnormality is seen. Status  post cholecystectomy. No biliary dilatation. Pancreas: Unremarkable. No pancreatic ductal dilatation or surrounding inflammatory changes. Spleen: Normal in size without focal abnormality. Adrenals/Urinary Tract: There is a 3 mm nonobstructing calculus lower pole left kidney. The right kidney is unremarkable. Ureters and bladder are unremarkable. The adrenals appear normal. Stomach/Bowel: No bowel obstruction or ileus. Normal appendix right lower quadrant. Vascular/Lymphatic: There is moderate diffuse atherosclerosis throughout the abdominal aorta. No pathologic mediastinal or hilar adenopathy. Reproductive: Uterus and bilateral adnexa are unremarkable. Other: No abdominal wall hernia or abnormality. No abdominopelvic ascites. Musculoskeletal: No acute or destructive bony lesions. Reconstructed images demonstrate no additional findings. IMPRESSION: 1. No acute intra-abdominal process. 2. 3 mm nonobstructing calculus lower pole left kidney. 3. Moderate diffuse atherosclerosis throughout the abdominal aorta. Electronically Signed   By: Randa Ngo M.D.   On: 09/22/2019 23:49   No results for input(s): WBC, HGB, HCT, PLT in the last 72 hours. No results for input(s): NA, K, CL, CO2, GLUCOSE, BUN, CREATININE, CALCIUM in the last 72 hours.  Intake/Output Summary (Last 24 hours) at 09/23/2019 1016 Last data filed at 09/23/2019 0815 Gross per 24 hour  Intake 1040 ml  Output 450 ml  Net 590 ml     Physical Exam: Vital Signs Blood pressure (!) 157/55, pulse 78, temperature 98.7 F (37.1 C), temperature source Oral, resp. rate 18, height 5\' 4"  (1.626 m), weight 91 kg, SpO2 98 %. Constitutional: No distress . Vital signs reviewed. HEENT: EOMI, oral membranes moist Neck: supple Cardiovascular: RRR without murmur. No JVD    Respiratory: CTA  Bilaterally without wheezes or rales. Normal effort    GI: BS +, non-tender, non-distended  Musculoskeletal:     Comments: lee heel cord remains tight, left shoulder with crepitus and limited ROM, 1/2" sublux---no changes  Neurological: Oriented to place, name, reason she's here.  No focal CN abnl. Left inattention is more severe  Motor: RUE/RLE: 5/5 proximal distal LUE: minimal activation of left upper ext today  LLE: 0/5 Sensation diminished to touch bilateral plantar feet and LLE, minimal sensory loss LUE. Sl extensor tone in LLE   Skin: Skin is warm and dry.  Psychiatric:flat  Assessment/Plan: 1. Functional deficits secondary to bicortical infarcts, Right frontal SAH which require 3+ hours per day of interdisciplinary therapy in a comprehensive inpatient rehab setting.  Physiatrist is providing close team supervision and 24 hour management of active medical problems listed below.  Physiatrist and rehab team continue to assess barriers to discharge/monitor patient progress toward functional and medical goals  Care Tool:  Bathing  Bathing activity did not occur: Refused           Bathing assist       Upper Body Dressing/Undressing Upper body dressing   What is the patient wearing?: Hospital gown only    Upper body assist Assist Level: Maximal Assistance - Patient 25 - 49%    Lower Body Dressing/Undressing Lower body dressing      What is the patient wearing?: Pants, Incontinence brief     Lower body assist Assist for lower body dressing: Total Assistance - Patient < 25%     Toileting Toileting    Toileting assist Assist for toileting: Total Assistance - Patient < 25%     Transfers Chair/bed transfer  Transfers assist     Chair/bed transfer assist level: Dependent - mechanical lift     Locomotion Ambulation   Ambulation assist   Ambulation activity did not occur: Safety/medical concerns  Walk 10 feet activity   Assist  Walk 10  feet activity did not occur: Safety/medical concerns        Walk 50 feet activity   Assist Walk 50 feet with 2 turns activity did not occur: Safety/medical concerns         Walk 150 feet activity   Assist Walk 150 feet activity did not occur: Safety/medical concerns         Walk 10 feet on uneven surface  activity   Assist Walk 10 feet on uneven surfaces activity did not occur: Safety/medical concerns         Wheelchair     Assist Will patient use wheelchair at discharge?: No(LT goals not set )             Wheelchair 50 feet with 2 turns activity    Assist            Wheelchair 150 feet activity     Assist          Blood pressure (!) 157/55, pulse 78, temperature 98.7 F (37.1 C), temperature source Oral, resp. rate 18, height 5\' 4"  (1.626 m), weight 91 kg, SpO2 98 %.  Medical Problem List and Plan: 1.  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 Patient with bilateral cerebral and right occipital infarcts last week as well as pulmonary emboli with DVT.             2/12-pt with increased left inattention and lethargy yesterday   -MRI yesterday revealed progression of bi-frontal infarcts as well as new mall infarcts right lateral occ lobe   -?etiology, CT abdomen without malignancy   -?venous source given PFO given lack of other obvious etiologies. IVCF being considered   -spoke with Dr. Erlinda Hong and son at length today. We have decided to proceed with IVCF   -continue therapies as tolerated   -son wants full Code at present 2. DVT/anticoagulation/Hx of PE: Eliquis             -antiplatelet therapy: N/A 3. Pain Management: Lidoderm patch change as directed  -hydrocodone  d/t ams  -sports cream  -try low dose tramadol 25mg  q8 prn  -left RTC/OA shoulder: had left shoulder injection 2 mos ago apparently   -pain worsened by left HP, capsulitis   -consider another  injection at some point when more medically stable   -in the meantime shoulder ROM and support 4. Mood: Valium as needed for anxiety and depression             -antipsychotic agents: N/A 5. Neuropsych: This patient is capable of making decisions on her own behalf. 6. Skin/Wound Care: Routine skin checks 7. Fluids/Electrolytes/Nutrition: encourage PO  -2/8 K+ 3.6 8.  PFO.  Follow cardiology services.  TEE completed, suggesting PFO, await further Cards recs.. 9.  Hypertension.  Cardizem 120 mg daily, Lasix 20 mg daily, Cozaar 50 mg daily            2/12 allow permissive HTN for adequate cerebral perfusion. 10.  Hyperlipidemia: Continue Lipitor 11.  Diabetes mellitus with hyperglycemia.  Hemoglobin A1c 9.4.  SSI.  Patient on Glucophage 1000 mg twice daily prior to admission.  Resume as needed            2/5 cbg's with intermittent elevation. Resume metformin at 250mg  bid  2/6- BGs 150s-206- will con't metformin since only been 24 hours- will monitor  2/7- BGs 130s-170s- will increase Metformin to  500 mg BID  2/9 cbg's under better control, intake inconsistent though  2/12: adequate control at present 12.  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  Continue nebulizer treatments.     -tolerating therapies thus far 13.  CKD stage II.                2/8 BUN/Cr 8/0.6 14.  Restless leg syndrome.  Requip 2 mg 3 times daily--Reduced to HS d/t lethargy  2/11 home requip resumed d/t improved arousal.  15. BP difference between arms?  2/7- consulted Cardiology- he felt there were no interventions but have them take BP in LUE from now on- order placed.       Greater than 35 total minutes was spent in examination of patient, assessment of pertinent data,  formulation of a treatment plan. Greater than 50% in direct discussion with MD and family.       LOS: 9 days A FACE TO FACE EVALUATION WAS PERFORMED  Meredith Staggers 09/23/2019, 10:16 AM

## 2019-09-23 NOTE — Consult Note (Signed)
Chief Complaint: Patient was seen in consultation today for cryptogenic bilateral embolic infarcts  Referring Physician(s): Dr. Rosalin Hawking  Supervising Physician: Aletta Edouard  Patient Status: [facility] - In-pt  History of Present Illness: [name] [name] is a 69 y.o. female with past medical history of CAD, CKD, COPD, DM, GERD, HTN, PFO, and cryptogenic bilateral embolic infarcts in [id number] now admitted to [facility] for ongoing rehabilitation.  After discharge, patient was found to have a PE as well as an age indeterminate R peroneal vein DVT.  She is on aspirin and Plavix post stroke, as well as Xarelto for her DVT. During her stay in CIR, she has shown progressive symptoms of her stroke- delays in processing, less engagement of left, increased lethargy. Repeat CT Head 2/10 shows progressive areas of cortical infarcts.  MRI showed progression of bifrontal infarcts as well as new area of infarct at the right lateral occipital lobe.   IR consulted for consideration of IVC filter placement.  Case reviewed and approved by Dr. Kathlene Cote.   Patient assessed at bedside this afternoon. She is alert, resting in her wheelchair.  She answers some questions appropriately and correctly, but is not able to provide history and does not demonstrate complete understanding of her current situation.  Past Medical History:  Diagnosis Date  . Aneurysm, thoracic aortic ([place])   . Anxiety   . Arthritis   . CAD (coronary artery disease)   . Chronic kidney disease    Renal insufficiency  . COPD (chronic obstructive pulmonary disease) ([place])   . Depression   . Diabetes mellitus   . Diverticulosis of colon (without mention of hemorrhage)   . Duodenitis without mention of hemorrhage   . Embolic stroke ([place])   . GERD (gastroesophageal reflux disease)   . Hemorrhoids   . Hypercholesteremia   . Hypertension   . Insomnia   . Pulmonary embolism ([place])   . Tinnitus   . Vertigo     Past Surgical History:    Procedure Laterality Date  . ANGIOPLASTY    . BUBBLE STUDY  09/14/2019   Procedure: BUBBLE STUDY;  Surgeon: Donato Heinz, MD;  Location: [facility];  Service: Endoscopy;;  . CHOLECYSTECTOMY    . ROTATOR CUFF REPAIR Right 04/2012  . TEE WITHOUT CARDIOVERSION N/A 09/14/2019   Procedure: TRANSESOPHAGEAL ECHOCARDIOGRAM (TEE);  Surgeon: Donato Heinz, MD;  Location: [facility] ENDOSCOPY;  Service: Endoscopy;  Laterality: N/A;  . TUBAL LIGATION      Allergies: Guaifenesin & derivatives, Tape, Benazepril, Morphine, Zyrtec [cetirizine], and Latex  Medications: Prior to Admission medications   Medication Sig Start Date End Date Taking? Authorizing Provider  albuterol (VENTOLIN HFA) 108 (90 Base) MCG/ACT inhaler Inhale 2 puffs into the lungs every 4 (four) hours as needed for wheezing or shortness of breath. 06/07/19   Susy Frizzle, MD  apixaban (ELIQUIS) 5 MG TABS tablet Take 1 tablet (5 mg total) by mouth 2 (two) times daily. 09/14/19   Terrilee Croak, MD  atorvastatin (LIPITOR) 20 MG tablet Take 1 tablet (20 mg total) by mouth daily. Patient taking differently: Take 20 mg by mouth at bedtime.  04/29/19   Susy Frizzle, MD  bimatoprost (LUMIGAN) 0.01 % SOLN Place 1 drop into both eyes at bedtime.    [provider]  brinzolamide (AZOPT) 1 % ophthalmic suspension Place 1 drop into both eyes every 12 (twelve) hours.     [provider]  Continuous Blood Gluc Sensor (FREESTYLE LIBRE 14 DAY SENSOR) MISC  CHANGE EVERY 14 DAYS Patient taking differently: Inject 1 patch into the skin every 14 (fourteen) days.  07/26/19   Susy Frizzle, MD  dexlansoprazole (DEXILANT) 60 MG capsule Take 1 capsule (60 mg total) by mouth daily. 04/27/19   Susy Frizzle, MD  diazepam (VALIUM) 5 MG tablet Take 1 tablet (5 mg total) by mouth every 12 (twelve) hours as needed for anxiety (insomnia). Patient taking differently: Take 5 mg by mouth every 12 (twelve) hours as needed (anxiety  or insomnia).  06/07/19   Susy Frizzle, MD  diltiazem (CARDIZEM) 120 MG tablet Take 120 mg by mouth daily.    [provider]  ergocalciferol (VITAMIN D2) 1.25 MG (50000 UT) capsule Take 1 capsule (50,000 Units total) by mouth once a week. Patient taking differently: Take 50,000 Units by mouth every Tuesday.  05/24/19   Susy Frizzle, MD  escitalopram (LEXAPRO) 10 MG tablet Take 1 tablet (10 mg total) by mouth daily. 06/07/19   Susy Frizzle, MD  furosemide (LASIX) 40 MG tablet Take 1 tablet (40 mg total) by mouth daily. 08/09/19   Susy Frizzle, MD  HYDROcodone-acetaminophen (NORCO) 10-325 MG per tablet Take 1 tablet by mouth every 6 (six) hours as needed for moderate pain.     [provider]  insulin aspart (NOVOLOG) 100 UNIT/ML injection Inject 0-5 Units into the skin at bedtime. 09/14/19   Dahal, Marlowe Aschoff, MD  insulin aspart (NOVOLOG) 100 UNIT/ML injection Inject 0-9 Units into the skin 3 (three) times daily with meals. 09/14/19   Dahal, Marlowe Aschoff, MD  ipratropium-albuterol (DUONEB) 0.5-2.5 (3) MG/3ML SOLN INHALE 1 VIAL VIA NEBULIZER EVERY 6 HOURS AS NEEDED Patient taking differently: Take 3 mLs by nebulization every 6 (six) hours as needed (as directed).  10/25/18   Susy Frizzle, MD  losartan (COZAAR) 100 MG tablet Take 1 tablet (100 mg total) by mouth daily. 04/27/19   Susy Frizzle, MD  metFORMIN (GLUCOPHAGE) 1000 MG tablet Take 1 tablet (1,000 mg total) by mouth 2 (two) times daily with a meal. 08/19/19   Susy Frizzle, MD  OXYGEN Inhale 2 L into the lungs at bedtime.     [provider]  potassium chloride SA (KLOR-CON) 20 MEQ tablet Take 1 tablet (20 mEq total) by mouth daily. 09/09/19   Susy Frizzle, MD  rOPINIRole (REQUIP) 2 MG tablet Take 1 tablet (2 mg total) by mouth 3 (three) times daily. Patient taking differently: Take 2 mg by mouth at bedtime.  04/27/19   Susy Frizzle, MD  Tiotropium Bromide-Olodaterol 2.5-2.5 MCG/ACT AERS  Inhale 2 puffs into the lungs daily. Patient taking differently: Inhale 1 puff into the lungs daily.  06/07/19   Susy Frizzle, MD     Family History  Problem Relation Age of Onset  . Diabetes Mother   . Hemophilia Mother   . Breast cancer Sister   . Heart disease Father   . Colon polyps Father   . Diabetes Brother   . Colon polyps Sister     Social History   Socioeconomic History  . Marital status: Widowed    Spouse name: Not on file  . Number of children: 1  . Years of education: Not on file  . Highest education level: Not on file  Occupational History  . Occupation: Retired  Tobacco Use  . Smoking status: Former Smoker    Types: Cigarettes    Quit date: 08/12/2018    Years since quitting: 1.1  .  Smokeless tobacco: Never Used  Substance and Sexual Activity  . Alcohol use: No    Alcohol/week: 0.0 standard drinks  . Drug use: No  . Sexual activity: Not on file  Other Topics Concern  . Not on file  Social History Narrative  . Not on file   Social Determinants of Health   Financial Resource Strain:   . Difficulty of Paying Living Expenses: Not on file  Food Insecurity:   . Worried About Charity fundraiser in the Last Year: Not on file  . Ran Out of Food in the Last Year: Not on file  Transportation Needs: No Transportation Needs  . Lack of Transportation (Medical): No  . Lack of Transportation (Non-Medical): No  Physical Activity:   . Days of Exercise per Week: Not on file  . Minutes of Exercise per Session: Not on file  Stress:   . Feeling of Stress : Not on file  Social Connections:   . Frequency of Communication with Friends and Family: Not on file  . Frequency of Social Gatherings with Friends and Family: Not on file  . Attends Religious Services: Not on file  . Active Member of Clubs or Organizations: Not on file  . Attends Archivist Meetings: Not on file  . Marital Status: Not on file     Review of Systems: A 12 point ROS discussed  and pertinent positives are indicated in the HPI above.  All other systems are negative.  Review of Systems  Unable to perform ROS: Mental status change    Vital Signs: BP (!) 160/54 (BP Location: Left Arm)   Pulse 93   Temp 98.2 F (36.8 C) (Oral)   Resp 17   Ht 5\' 4"  (1.626 m)   Wt 200 lb 9.9 oz (91 kg)   SpO2 93%   BMI 34.44 kg/m   Physical Exam Vitals and nursing note reviewed.  Constitutional:      General: She is not in acute distress.    Appearance: Normal appearance. She is not ill-appearing.  HENT:     Mouth/Throat:     Mouth: Mucous membranes are moist.     Pharynx: Oropharynx is clear.  Cardiovascular:     Rate and Rhythm: Normal rate and regular rhythm.  Pulmonary:     Effort: Pulmonary effort is normal. No respiratory distress.     Breath sounds: Normal breath sounds.  Abdominal:     General: Abdomen is flat.     Palpations: Abdomen is soft.  Skin:    General: Skin is warm and dry.  Neurological:     Mental Status: She is alert. She is confused.     Motor: Weakness present.  Psychiatric:        Mood and Affect: Mood normal.        Behavior: Behavior normal.      MD Evaluation Airway: WNL Heart: WNL Abdomen: WNL Chest/ Lungs: WNL ASA  Classification: 3 Mallampati/Airway Score: Two   Imaging: CT HEAD WO CONTRAST  Result Date: 09/21/2019 CLINICAL DATA:  Increased lethargy and confusion EXAM: CT HEAD WITHOUT CONTRAST TECHNIQUE: Contiguous axial images were obtained from the base of the skull through the vertex without intravenous contrast. COMPARISON:  09/14/19 FINDINGS: Brain: Progressive cortical infarction is noted bilaterally particularly within the higher parietal lobe and frontal lobes. Some parietooccipital infarct is noted on the right as well. Previously seen subarachnoid hemorrhage is not well appreciated on today's exam. Vascular: No hyperdense vessel or unexpected calcification. Skull:  Normal. Negative for fracture or focal lesion.  Sinuses/Orbits: No acute finding. Other: None. IMPRESSION: Progressive areas of cortical infarct when compared with the study from 3 days previous. Previously seen subarachnoid hemorrhage has resolved in the interval. No new hemorrhage is seen. Electronically Signed   By: Inez Catalina M.D.   On: 09/21/2019 14:29   CT HEAD WO CONTRAST  Result Date: 09/14/2019 CLINICAL DATA:  Neuro deficit with acute stroke suspected EXAM: CT HEAD WITHOUT CONTRAST TECHNIQUE: Contiguous axial images were obtained from the base of the skull through the vertex without intravenous contrast. COMPARISON:  09/11/2019 FINDINGS: Brain: Small patchy acute to subacute infarcts along the bilateral cerebral convexities with subarachnoid hemorrhage along the high and anterior right frontal sulcus, appearance stable from prior MRI. No evidence of acute infarct or interval hemorrhage. No hydrocephalus or worrisome swelling. Vascular: No hyperdense vessel or unexpected calcification. Skull: Normal. Negative for fracture or focal lesion. Sinuses/Orbits: No acute finding. IMPRESSION: Multifocal cortical infarction with small volume subarachnoid hemorrhage in the high right frontal lobe. No new or progressive finding when compared to brain MRI 09/11/2019. Electronically Signed   By: Monte Fantasia M.D.   On: 09/14/2019 11:07   MR BRAIN WO CONTRAST  Result Date: 09/22/2019 CLINICAL DATA:  69 year old female status post widespread scattered and watershed infarcts in both cerebral hemispheres last month. New onset left side neglect and right side gaze while in therapy. EXAM: MRI HEAD WITHOUT CONTRAST TECHNIQUE: Multiplanar, multiecho pulse sequences of the brain and surrounding structures were obtained without intravenous contrast. COMPARISON:  Brain MRI 09/11/2019 and earlier. FINDINGS: The examination had to be discontinued prior to completion due to patient altered mental status. Diagnostic diffusion-weighted imaging plus axial T2, FLAIR,  sagittal T1 and coronal T2 weighted imaging was obtained. Brain: Confluent new areas of restricted diffusion in the right middle and bilateral superior frontal gyri when compared to 09/11/2019 (series 3, image 40). Confluent superior right perirolandic involvement. Much smaller new or increased area of right lateral occipital lobe diffusion restriction on series 3, image 26. No deep gray nuclei, brainstem or cerebellar involvement. Confluent cytotoxic edema in the new areas of ischemia. No midline shift or significant intracranial mass effect. Basilar cisterns remain patent. No ventriculomegaly. Expected evolution of the January ischemic disease. No new signal abnormality in the deep gray nuclei, brainstem or cerebellum. Pituitary and cervicomedullary junction appear to remain normal. Vascular: Major intracranial vascular flow voids are preserved and stable. Skull and upper cervical spine: Grossly negative visible cervical spine. Normal bone marrow signal. Sinuses/Orbits: Negative orbits. Paranasal sinuses and mastoids are stable and well pneumatized. Other: Scalp and face soft tissues appear negative. IMPRESSION: 1. Confluent new areas of acute/subacute infarction in the bilateral superior frontal gyri, more extensive on the right. Much smaller new area of right lateral occipital lobe infarction. 2. Cytotoxic edema with no evidence of hemorrhage hemorrhage, and no significant intracranial mass effect. 3. The examination had to be discontinued prior to completion due to patient altered mental status. Electronically Signed   By: Genevie Ann M.D.   On: 09/22/2019 17:09   MR BRAIN WO CONTRAST  Result Date: 09/11/2019 CLINICAL DATA:  Initial evaluation for acute difficulty walking, stroke suspected. EXAM: MRI HEAD WITHOUT CONTRAST TECHNIQUE: Multiplanar, multiecho pulse sequences of the brain and surrounding structures were obtained without intravenous contrast. COMPARISON:  Comparison made with multiple recent brain  MRIs, most recent of which from 09/07/2019. FINDINGS: Brain: Generalized age-related cerebral atrophy with chronic microvascular ischemic disease again noted. Multiple remote  lacunar infarcts present within the hemispheric cerebral white matter. Continued interval evolution of previously seen subacute watershed type infarcts involving the bilateral cerebral hemispheres. However, multiple new watershed type infarcts are now seen on today's exam, increased in size and number but with similar distribution as compared to previous. Overall, involvement is slightly worse within the right cerebral hemisphere as compared to the left. New patchy involvement of the bilateral parieto-occipital regions as well, also greater on the right (series 5, image 66). Now seen are a few scattered foci of susceptibility artifact associated with these infarcts, consistent with associated petechial hemorrhage (series 14, image 46). Additionally, there is a serpiginous focus of FLAIR and susceptibility artifact seen layering along a cortical sulcus within the right frontal lobe, concerning for a small volume subarachnoid hemorrhage, new from previous (series 14, image 46). No frank intraparenchymal hematoma. No significant mass effect. No mass lesion or midline shift. No hydrocephalus. No extra-axial fluid collection. Pituitary gland suprasellar region normal. Midline structures intact. Vascular: Major intracranial vascular flow voids are maintained. Skull and upper cervical spine: Craniocervical junction normal. Upper cervical spine within normal limits. Bone marrow signal intensity normal. No scalp soft tissue abnormality. Sinuses/Orbits: Globes and orbital soft tissues within normal limits. Paranasal sinuses remain largely clear. No significant mastoid effusion. Other: None. IMPRESSION: 1. Continued interval evolution of previously seen subacute infarcts, with multiple new scattered acute ischemic watershed type infarcts involving the  bilateral cerebral hemispheres, right greater than left. Associated petechial hemorrhage about a few of these infarcts without frank hemorrhagic transformation. 2. Small volume subarachnoid hemorrhage layering along the cortical sulcus within the right frontal lobe, new from previous. 3. Otherwise stable exam with underlying cerebral atrophy, chronic microvascular ischemic disease, and multiple remote lacunar infarcts involving the hemispheric cerebral white matter. Critical Value/emergent results were called by telephone at the time of interpretation on 09/11/2019 at 8:43 pm to provider [facility] , who verbally acknowledged these results. Electronically Signed   By: Jeannine Boga M.D.   On: 09/11/2019 20:43   MR BRAIN WO CONTRAST  Result Date: 09/07/2019 CLINICAL DATA:  Initial evaluation for no deficit, possible acute stroke. History of recent strokes earlier this month. EXAM: MRI HEAD WITHOUT CONTRAST TECHNIQUE: Multiplanar, multiecho pulse sequences of the brain and surrounding structures were obtained without intravenous contrast. COMPARISON:  Previous CT from earlier same day as well as prior MRI from 08/18/2019. FINDINGS: Brain: Generalized age-related cerebral atrophy with chronic microvascular ischemic disease the and noted. Multiple remote lacunar infarcts noted within the hemispheric cerebral white matter. There has been interval evolution of previously seen small volume watershed type infarcts involving the bilateral cerebral hemispheres. Overall, number and distribution of these infarcts is relatively similar, with perhaps a few new subcentimeter foci seen bilaterally. Overall, appearance is felt to be consistent with normal expected interval evolution, with no definite new or significant infarct identified. No evidence for hemorrhagic transformation, mass effect, or other complication. No other acute intracranial abnormality. No mass lesion, mass effect, or midline shift. No  hydrocephalus. No extra-axial fluid collection. Pituitary gland within normal limits. Midline structures intact. Vascular: Major intracranial vascular flow voids are maintained. Skull and upper cervical spine: Craniocervical junction within normal limits. Degenerative spondylosis and facet hypertrophy at C4-5 with resultant mild spinal stenosis. Visualized upper cervical spine otherwise unremarkable. Bone marrow signal intensity within normal limits. No scalp soft tissue abnormality. Sinuses/Orbits: Globes and orbital soft tissues within normal limits. Mild scattered mucosal thickening noted within the ethmoidal air cells. Paranasal sinuses are  otherwise clear. No mastoid effusion. Inner ear structures are within normal limits. Other: None. IMPRESSION: 1. Interval evolution of previously seen scattered small volume watershed type infarcts involving the bilateral cerebral hemispheres. Overall, appearance and distribution is relatively similar, and felt to be within normal limits for normal expected evolutionary changes. No hemorrhagic transformation or other complication identified. 2. Underlying atrophy with chronic microvascular ischemic disease. No other new acute intracranial abnormality. Electronically Signed   By: Jeannine Boga M.D.   On: 09/07/2019 19:26   CT ABDOMEN PELVIS W CONTRAST  Result Date: 09/22/2019 CLINICAL DATA:  Stroke, hypercoagulability EXAM: CT ABDOMEN AND PELVIS WITH CONTRAST TECHNIQUE: Multidetector CT imaging of the abdomen and pelvis was performed using the standard protocol following bolus administration of intravenous contrast. CONTRAST:  116mL OMNIPAQUE IOHEXOL 300 MG/ML  SOLN COMPARISON:  08/23/2019 FINDINGS: Lower chest: No acute pleural or parenchymal lung disease. Hepatobiliary: No focal liver abnormality is seen. Status post cholecystectomy. No biliary dilatation. Pancreas: Unremarkable. No pancreatic ductal dilatation or surrounding inflammatory changes. Spleen: Normal  in size without focal abnormality. Adrenals/Urinary Tract: There is a 3 mm nonobstructing calculus lower pole left kidney. The right kidney is unremarkable. Ureters and bladder are unremarkable. The adrenals appear normal. Stomach/Bowel: No bowel obstruction or ileus. Normal appendix right lower quadrant. Vascular/Lymphatic: There is moderate diffuse atherosclerosis throughout the abdominal aorta. No pathologic mediastinal or hilar adenopathy. Reproductive: Uterus and bilateral adnexa are unremarkable. Other: No abdominal wall hernia or abnormality. No abdominopelvic ascites. Musculoskeletal: No acute or destructive bony lesions. Reconstructed images demonstrate no additional findings. IMPRESSION: 1. No acute intra-abdominal process. 2. 3 mm nonobstructing calculus lower pole left kidney. 3. Moderate diffuse atherosclerosis throughout the abdominal aorta. Electronically Signed   By: Randa Ngo M.D.   On: 09/22/2019 23:49   VAS [place] TRANSCRANIAL DOPPLER W BUBBLES  Result Date: 08/30/2019  Transcranial Doppler with Bubble Indications: Stroke. Performing Technologist: Abram Sander RVS  Examination Guidelines: A complete evaluation includes B-mode imaging, spectral Doppler, color Doppler, and power Doppler as needed of all accessible portions of each vessel. Bilateral testing is considered an integral part of a complete examination. Limited examinations for reoccurring indications may be performed as noted.  Summary:  A vascular evaluation was performed. The right middle cerebral artery was studied. An IV was inserted into the patient's right hand . Verbal informed consent was obtained.  HITS heard at rest: 1-2 Clinically insignificant. Weakly positive TCD Bubble study indiicative of a small likely clinically insignificant right to left shunt *See table(s) above for TCD measurements and observations.  Diagnosing physician: Antony Contras MD Electronically signed by Antony Contras MD on 08/30/2019 at 3:27:16 PM.     Final    ECHO TEE  Result Date: 09/14/2019   TRANSESOPHOGEAL ECHO REPORT   Patient Name:   MAKYNZIE REDING Date of Exam: 09/14/2019 Medical Rec #:  [id number]       Height:       64.0 in Accession #:    [id number]      Weight:       200.6 lb Date of Birth:  04/13/1951      BSA:          1.96 m Patient Age:    33 years        BP:           139/85 mmHg Patient Gender: F               HR:  72 bpm. Exam Location:  Inpatient  Procedure: Transesophageal Echo, Cardiac Doppler and Color Doppler Indications:     stroke  History:         Patient has prior history of Echocardiogram examinations, most                  recent 08/19/2019.  Sonographer:     Johny Chess Referring Phys:  [id number] Woodfin Ganja THOMPSON Diagnosing Phys: Oswaldo Milian MD  PROCEDURE: Patients was monitored while under deep sedation. The transesophogeal probe was passed through the esophogus of the patient. The patient developed no complications during the procedure. IMPRESSIONS  1. Left ventricular ejection fraction, by visual estimation, is 60 to 65%. The left ventricle has normal function. There is mildly increased left ventricular hypertrophy.  2. Global right ventricle has normal systolic function.The right ventricular size is normal. No increase in right ventricular wall thickness.  3. The mitral valve is normal in structure. Trivial mitral valve regurgitation.  4. The tricuspid valve is normal in structure. Tricuspid valve regurgitation is trivial.  5. The aortic valve is tricuspid. Aortic valve regurgitation is mild.  6. The pulmonic valve was grossly normal. Pulmonic valve regurgitation is trivial.  7. Lipomatous hypertrophy of interatrial septum. Positive bubble study consistent wtih PFO. FINDINGS  Left Ventricle: Left ventricular ejection fraction, by visual estimation, is 60 to 65%. The left ventricle has normal function. The left ventricle has no regional wall motion abnormalities. There is mildly increased left ventricular  hypertrophy. Right Ventricle: The right ventricular size is normal. No increase in right ventricular wall thickness. Global RV systolic function is has normal systolic function. Left Atrium: Left atrial size was normal in size. Right Atrium: Right atrial size was normal in size Prominent Chiari network and Prominent Eustachian valve. Pericardium: There is no evidence of pericardial effusion. Mitral Valve: The mitral valve is normal in structure. Trivial mitral valve regurgitation. Tricuspid Valve: The tricuspid valve is normal in structure. Tricuspid valve regurgitation is trivial. Aortic Valve: The aortic valve is tricuspid. Aortic valve regurgitation is mild. Pulmonic Valve: The pulmonic valve was grossly normal. Pulmonic valve regurgitation is trivial. Aorta: The aortic root is normal in size and structure. Shunts: Agitated saline contrast was given intravenously to evaluate for intracardiac shunting. Saline shunting was observed within 3-6 cardiac cycles suggestive of interatrial shunt. Evidence of atrial level shunting detected by color flow Doppler.  Oswaldo Milian MD Electronically signed by Oswaldo Milian MD Signature Date/Time: 09/14/2019/7:10:52 PM    Final    CT HEAD CODE STROKE WO CONTRAST  Result Date: 09/07/2019 CLINICAL DATA:  Code stroke. Neuro deficit, acute, stroke suspected. EXAM: CT HEAD WITHOUT CONTRAST TECHNIQUE: Contiguous axial images were obtained from the base of the skull through the vertex without intravenous contrast. COMPARISON:  CT angiogram head/neck 08/19/2019, brain MRI 08/18/2019, head CT 08/18/2019 FINDINGS: Brain: Multifocal subacute watershed infarcts again demonstrated within the bilateral cerebral hemispheres as well as a small subacute right occipital lobe infarct largely better appreciated on prior MRI 08/18/2019. No new demarcated infarct is identified. No evidence of acute intracranial hemorrhage. No evidence of intracranial mass. No midline shift or  extra-axial fluid collection. Background mild ill-defined hypoattenuation within cerebral white matter is nonspecific, but consistent with chronic small vessel ischemic disease. Mild generalized parenchymal atrophy. Vascular: No hyperdense vessel.  Atherosclerotic calcifications. Skull: Normal. Negative for fracture or focal lesion. Sinuses/Orbits: Visualized orbits demonstrate no acute abnormality. Tiny left ethmoid sinus osteoma. Minimal ethmoid sinus mucosal thickening. No significant mastoid effusion. These  results were called by telephone at the time of interpretation on 09/07/2019 at 5:20 pm to provider Dr. Rory Percy, who verbally acknowledged these results. IMPRESSION: Multifocal subacute watershed infarcts again demonstrated within the bilateral cerebral hemispheres as well as a small subacute right occipital lobe infarct, largely better appreciated on prior MRI 08/18/2019. No new demarcated infarct is identified. No evidence of acute intracranial hemorrhage. Stable background mild generalized parenchymal atrophy and chronic small vessel ischemic disease. Electronically Signed   By: Kellie Simmering DO   On: 09/07/2019 17:21   VAS [place] LOWER EXTREMITY VENOUS (DVT)  Result Date: 08/30/2019  Lower Venous Study Indications: Stroke.  Comparison Study: 04/06/18 Performing Technologist: Abram Sander RVS  Examination Guidelines: A complete evaluation includes B-mode imaging, spectral Doppler, color Doppler, and power Doppler as needed of all accessible portions of each vessel. Bilateral testing is considered an integral part of a complete examination. Limited examinations for reoccurring indications may be performed as noted.  +---------+---------------+---------+-----------+----------+-----------------+ RIGHT    CompressibilityPhasicitySpontaneityPropertiesThrombus Aging    +---------+---------------+---------+-----------+----------+-----------------+ CFV      Full           Yes      Yes                                     +---------+---------------+---------+-----------+----------+-----------------+ SFJ      Full                                                           +---------+---------------+---------+-----------+----------+-----------------+ FV Prox  Full                                                           +---------+---------------+---------+-----------+----------+-----------------+ FV Mid   Full                                                           +---------+---------------+---------+-----------+----------+-----------------+ FV DistalFull                                                           +---------+---------------+---------+-----------+----------+-----------------+ PFV      Full                                                           +---------+---------------+---------+-----------+----------+-----------------+ POP      Full           Yes      Yes                                    +---------+---------------+---------+-----------+----------+-----------------+  PTV      Full                                                           +---------+---------------+---------+-----------+----------+-----------------+ PERO     None                                         Age Indeterminate +---------+---------------+---------+-----------+----------+-----------------+   +---------+---------------+---------+-----------+----------+--------------+ LEFT     CompressibilityPhasicitySpontaneityPropertiesThrombus Aging +---------+---------------+---------+-----------+----------+--------------+ CFV      Full           Yes      Yes                                 +---------+---------------+---------+-----------+----------+--------------+ SFJ      Full                                                        +---------+---------------+---------+-----------+----------+--------------+ FV Prox  Full                                                         +---------+---------------+---------+-----------+----------+--------------+ FV Mid   Full                                                        +---------+---------------+---------+-----------+----------+--------------+ FV DistalFull                                                        +---------+---------------+---------+-----------+----------+--------------+ PFV      Full                                                        +---------+---------------+---------+-----------+----------+--------------+ POP      Full           Yes      Yes                                 +---------+---------------+---------+-----------+----------+--------------+ PTV      Full                                                        +---------+---------------+---------+-----------+----------+--------------+  PERO     Full                                                        +---------+---------------+---------+-----------+----------+--------------+     Summary: Right: Findings consistent with age indeterminate deep vein thrombosis involving the right peroneal veins. No cystic structure found in the popliteal fossa. Left: There is no evidence of deep vein thrombosis in the lower extremity. No cystic structure found in the popliteal fossa.  *See table(s) above for measurements and observations. Electronically signed by Deitra Mayo MD on 08/30/2019 at 3:49:27 PM.    Final     Labs:  CBC: Recent Labs    09/11/19 1556 09/14/19 0236 09/15/19 0537 09/19/19 0508  WBC 9.9 9.2 9.5 10.1  HGB 12.3 11.9* 12.6 13.5  HCT 38.2 36.9 37.6 41.3  PLT 315 300 313 373    COAGS: Recent Labs    09/07/19 1623  INR 1.2  APTT 28    BMP: Recent Labs    09/11/19 1556 09/14/19 0236 09/15/19 0537 09/19/19 0508  NA 141 139 136 138  K 3.8 3.9 3.3* 3.6  CL 106 102 103 102  CO2 25 26 24 27   GLUCOSE 124* 165* 180* 168*  BUN 12 11 11 8   CALCIUM 9.4 8.7* 9.2 9.6    CREATININE 0.79 0.92 0.70 0.64  GFRNONAA >60 >60 >60 >60  GFRAA >60 >60 >60 >60    LIVER FUNCTION TESTS: Recent Labs    08/18/19 1542 09/07/19 1623 09/11/19 1556 09/15/19 0537  BILITOT 0.3 0.2* 0.2* 0.4  AST 24 28 28 22   ALT 32 30 30 24   ALKPHOS 52 49 49 47  PROT 6.8 6.9 6.7 6.4*  ALBUMIN 3.1* 3.5 3.3* 3.2*    TUMOR MARKERS: No results for input(s): AFPTM, CEA, CA199, CHROMGRNA in the last 8760 hours.  Assessment and Plan: Recurrent embolic strokes Patient with progression of her neurologic deficits while in rehab.   She does have a PFO with recent findings of an age indeterminate right peroneal DVT.  She is on Eliquis, however concern for paradoxical emboli as the source of her ongoing strokes.  Recent bubble study was weakly positive.   Not currently on aspirin or Plavix.   Neurology has assessed Mrs. Marocco and feel that an IVC filter is indicated for additional protection against potentially mobile thrombus.   Case reviewed and approved by Dr. Kathlene Cote.  Spoke with son, Serenidy Amabile, who provides consent for procedure.   NPO p MN for possible filter placement tomorrow.   Risks and benefits discussed with the patient including, but not limited to bleeding, infection, contrast induced renal failure, filter fracture or migration which can lead to emergency surgery or even death, strut penetration with damage or irritation to adjacent structures and caval thrombosis.  All of the patient's questions were answered, patient is agreeable to proceed. Consent signed and in chart.  Thank you for this interesting consult.  I greatly enjoyed meeting [name] [name] and look forward to participating in their care.  A copy of this report was sent to the requesting provider on this date.  Electronically Signed: Docia Barrier, PA 09/23/2019, 4:43 PM   I spent a total of 40 Minutes    in face to face in clinical consultation, greater than 50% of which was  counseling/coordinating care for recurrent embolic strokes, PE, DVT.

## 2019-09-24 ENCOUNTER — Inpatient Hospital Stay (HOSPITAL_COMMUNITY): Payer: Medicare Other

## 2019-09-24 ENCOUNTER — Inpatient Hospital Stay (HOSPITAL_COMMUNITY): Payer: Medicare Other | Admitting: Speech Pathology

## 2019-09-24 HISTORY — PX: IR IVC FILTER PLMT / S&I /IMG GUID/MOD SED: IMG701

## 2019-09-24 LAB — GLUCOSE, CAPILLARY
Glucose-Capillary: 143 mg/dL — ABNORMAL HIGH (ref 70–99)
Glucose-Capillary: 137 mg/dL — ABNORMAL HIGH (ref 70–99)
Glucose-Capillary: 123 mg/dL — ABNORMAL HIGH (ref 70–99)
Glucose-Capillary: 110 mg/dL — ABNORMAL HIGH (ref 70–99)

## 2019-09-24 MED ORDER — IOHEXOL 300 MG/ML  SOLN
100.0000 mL | Freq: Once | INTRAMUSCULAR | Status: AC | PRN
  Administered 2019-09-24: 35 mL via INTRAVENOUS

## 2019-09-24 MED ORDER — MIDAZOLAM HCL 2 MG/2ML IJ SOLN
INTRAMUSCULAR | Status: AC
  Filled 2019-09-24: qty 2

## 2019-09-24 MED ORDER — SODIUM CHLORIDE 0.9 % IV SOLN
INTRAVENOUS | Status: AC | PRN
  Administered 2019-09-24: 10 mL/h via INTRAVENOUS

## 2019-09-24 MED ORDER — MIDAZOLAM HCL 2 MG/2ML IJ SOLN
INTRAMUSCULAR | Status: AC | PRN
  Administered 2019-09-24: 1 mg via INTRAVENOUS
  Administered 2019-09-24: 0.5 mg via INTRAVENOUS

## 2019-09-24 MED ORDER — LIDOCAINE HCL 1 % IJ SOLN
INTRAMUSCULAR | Status: AC
  Filled 2019-09-24: qty 20

## 2019-09-24 MED ORDER — FENTANYL CITRATE (PF) 100 MCG/2ML IJ SOLN
INTRAMUSCULAR | Status: AC | PRN
  Administered 2019-09-24 (×2): 25 ug via INTRAVENOUS

## 2019-09-24 MED ORDER — FENTANYL CITRATE (PF) 100 MCG/2ML IJ SOLN
INTRAMUSCULAR | Status: AC
  Filled 2019-09-24: qty 2

## 2019-09-24 NOTE — Progress Notes (Signed)
Speech Language Pathology Daily Session Note  Patient Details  Name: [name] [name] MRN: [id number] Date of Birth: 11-Oct-1950  Today's Date: 09/24/2019 SLP Individual Time: 24-1400 SLP Individual Time Calculation (min): 28 min  Short Term Goals: Week 2: SLP Short Term Goal 1 (Week 2): Pt will sustain attention to tasks for 5 minute intervals with Mod A cues for redirection. SLP Short Term Goal 2 (Week 2): Pt will recall new and/or complex daily information with Mod A verbal/visual cues for use of compensatory strategies. SLP Short Term Goal 3 (Week 2): Pt will demonstrate problem solving during basic tasks with Mod A verbal/visual cues. SLP Short Term Goal 4 (Week 2): Pt will demonstrate emergent awareness by detecting errors during functional tasks with Max A verbal/visual cues. SLP Short Term Goal 5 (Week 2): Patient will scan to her left field of envionrment during functional tasks with Max A multimodal cues.  Skilled Therapeutic Interventions:  Pt was seen for skilled ST targeting cognitive goals.  Pt was seated in wheelchair with nursing upon therapist's arrival, getting ready to eat lunch.  Pt required tray set up by therapist to minimize distractions and max assist multimodal cues for self feeding due to decreased task initiation and decreased attention to task.  Pt ate ~3 bites of her lunch before declining any more due to 9/10 headache.  Pt was obviously distracted by headache and appeared to be in distress somewhat as evidenced by increased respiratory rate and grabbing at her forehead.  RN was made aware and administered medications.  Due to pain limiting ability to participate in treatment, pt missed 32 min of scheduled treatment.  SLP will continue efforts at next available appointment.  Continue per current plan of care.    Pain Pain Assessment Pain Scale: 0-10 Pain Score: 9  Faces Pain Scale: No hurt Pain Location: Head Pain Descriptors / Indicators:  Headache  Therapy/Group: Individual Therapy  Hannan Hutmacher, Selinda Orion 09/24/2019, 2:05 PM

## 2019-09-24 NOTE — Procedures (Signed)
Interventional Radiology Procedure Note  Procedure: IVC venogram, IVC filter placement  Complications: None  Estimated Blood Loss: < 10 mL  Findings: IVC venogram shows normally patent IVC. [place] IVC filter placed in infrarenal IVC.  Venetia Night. Kathlene Cote, M.D Pager:  [phone]

## 2019-09-24 NOTE — Progress Notes (Signed)
[facility] PHYSICAL MEDICINE & REHABILITATION PROGRESS NOTE   Subjective/Complaints: Patient incontinent of loose bowel.  Scheduled for vena cava filter today.  Denies any abdominal pain no nausea or vomiting  ROS: Patient denies cp SOB N/V/D  Objective:   MR BRAIN WO CONTRAST  Result Date: 09/22/2019 CLINICAL DATA:  69 year old female status post widespread scattered and watershed infarcts in both cerebral hemispheres last month. New onset left side neglect and right side gaze while in therapy. EXAM: MRI HEAD WITHOUT CONTRAST TECHNIQUE: Multiplanar, multiecho pulse sequences of the brain and surrounding structures were obtained without intravenous contrast. COMPARISON:  Brain MRI 09/11/2019 and earlier. FINDINGS: The examination had to be discontinued prior to completion due to patient altered mental status. Diagnostic diffusion-weighted imaging plus axial T2, FLAIR, sagittal T1 and coronal T2 weighted imaging was obtained. Brain: Confluent new areas of restricted diffusion in the right middle and bilateral superior frontal gyri when compared to 09/11/2019 (series 3, image 40). Confluent superior right perirolandic involvement. Much smaller new or increased area of right lateral occipital lobe diffusion restriction on series 3, image 26. No deep gray nuclei, brainstem or cerebellar involvement. Confluent cytotoxic edema in the new areas of ischemia. No midline shift or significant intracranial mass effect. Basilar cisterns remain patent. No ventriculomegaly. Expected evolution of the January ischemic disease. No new signal abnormality in the deep gray nuclei, brainstem or cerebellum. Pituitary and cervicomedullary junction appear to remain normal. Vascular: Major intracranial vascular flow voids are preserved and stable. Skull and upper cervical spine: Grossly negative visible cervical spine. Normal bone marrow signal. Sinuses/Orbits: Negative orbits. Paranasal sinuses and mastoids are stable and well  pneumatized. Other: Scalp and face soft tissues appear negative. IMPRESSION: 1. Confluent new areas of acute/subacute infarction in the bilateral superior frontal gyri, more extensive on the right. Much smaller new area of right lateral occipital lobe infarction. 2. Cytotoxic edema with no evidence of hemorrhage hemorrhage, and no significant intracranial mass effect. 3. The examination had to be discontinued prior to completion due to patient altered mental status. Electronically Signed   By: Genevie Ann M.D.   On: 09/22/2019 17:09   CT ABDOMEN PELVIS W CONTRAST  Result Date: 09/22/2019 CLINICAL DATA:  Stroke, hypercoagulability EXAM: CT ABDOMEN AND PELVIS WITH CONTRAST TECHNIQUE: Multidetector CT imaging of the abdomen and pelvis was performed using the standard protocol following bolus administration of intravenous contrast. CONTRAST:  119mL OMNIPAQUE IOHEXOL 300 MG/ML  SOLN COMPARISON:  08/23/2019 FINDINGS: Lower chest: No acute pleural or parenchymal lung disease. Hepatobiliary: No focal liver abnormality is seen. Status post cholecystectomy. No biliary dilatation. Pancreas: Unremarkable. No pancreatic ductal dilatation or surrounding inflammatory changes. Spleen: Normal in size without focal abnormality. Adrenals/Urinary Tract: There is a 3 mm nonobstructing calculus lower pole left kidney. The right kidney is unremarkable. Ureters and bladder are unremarkable. The adrenals appear normal. Stomach/Bowel: No bowel obstruction or ileus. Normal appendix right lower quadrant. Vascular/Lymphatic: There is moderate diffuse atherosclerosis throughout the abdominal aorta. No pathologic mediastinal or hilar adenopathy. Reproductive: Uterus and bilateral adnexa are unremarkable. Other: No abdominal wall hernia or abnormality. No abdominopelvic ascites. Musculoskeletal: No acute or destructive bony lesions. Reconstructed images demonstrate no additional findings. IMPRESSION: 1. No acute intra-abdominal process. 2. 3 mm  nonobstructing calculus lower pole left kidney. 3. Moderate diffuse atherosclerosis throughout the abdominal aorta. Electronically Signed   By: Randa Ngo M.D.   On: 09/22/2019 23:49   No results for input(s): WBC, HGB, HCT, PLT in the last 72 hours. Recent Labs  09/23/19 1637  NA 131*  K 3.7  CL 97*  CO2 20*  GLUCOSE 158*  BUN 28*  CREATININE 0.87  CALCIUM 9.4    Intake/Output Summary (Last 24 hours) at 09/24/2019 0916 Last data filed at 09/24/2019 0700 Gross per 24 hour  Intake 190 ml  Output 350 ml  Net -160 ml     Physical Exam: Vital Signs Blood pressure (!) 163/55, pulse 95, temperature 98.3 F (36.8 C), resp. rate 18, height 5\' 4"  (1.626 m), weight 91 kg, SpO2 96 %. Constitutional: No distress . Vital signs reviewed. HEENT: EOMI, oral membranes moist Neck: supple Cardiovascular: RRR without murmur. No JVD    Respiratory: CTA Bilaterally without wheezes or rales. Normal effort    GI: BS +, non-tender, non-distended  Musculoskeletal:     Comments: lee heel cord remains tight, left shoulder with crepitus and limited ROM, 1/2" sublux---no changes  Neurological: Oriented to person and place but not time no focal CN abnl. Left inattention is more severe  Motor: RUE/RLE: 4/5 proximal distal LUE: 0/5 LLE: 0/5  LUE. Sl extensor tone in LLE   Skin: Skin is warm and dry.  Psychiatric:flat  Assessment/Plan: 1. Functional deficits secondary to bicortical infarcts, Right frontal SAH which require 3+ hours per day of interdisciplinary therapy in a comprehensive inpatient rehab setting.  Physiatrist is providing close team supervision and 24 hour management of active medical problems listed below.  Physiatrist and rehab team continue to assess barriers to discharge/monitor patient progress toward functional and medical goals  Care Tool:  Bathing  Bathing activity did not occur: Refused           Bathing assist       Upper Body Dressing/Undressing Upper body  dressing   What is the patient wearing?: Hospital gown only    Upper body assist Assist Level: Maximal Assistance - Patient 25 - 49%    Lower Body Dressing/Undressing Lower body dressing      What is the patient wearing?: Pants, Incontinence brief     Lower body assist Assist for lower body dressing: Total Assistance - Patient < 25%     Toileting Toileting    Toileting assist Assist for toileting: Total Assistance - Patient < 25%     Transfers Chair/bed transfer  Transfers assist     Chair/bed transfer assist level: Dependent - mechanical lift     Locomotion Ambulation   Ambulation assist   Ambulation activity did not occur: Safety/medical concerns          Walk 10 feet activity   Assist  Walk 10 feet activity did not occur: Safety/medical concerns        Walk 50 feet activity   Assist Walk 50 feet with 2 turns activity did not occur: Safety/medical concerns         Walk 150 feet activity   Assist Walk 150 feet activity did not occur: Safety/medical concerns         Walk 10 feet on uneven surface  activity   Assist Walk 10 feet on uneven surfaces activity did not occur: Safety/medical concerns         Wheelchair     Assist Will patient use wheelchair at discharge?: No(LT goals not set )             Wheelchair 50 feet with 2 turns activity    Assist            Wheelchair 150 feet activity     Assist  Blood pressure (!) 163/55, pulse 95, temperature 98.3 F (36.8 C), resp. rate 18, height 5\' 4"  (1.626 m), weight 91 kg, SpO2 96 %.  Medical Problem List and Plan: 1.  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 Patient with bilateral cerebral and right occipital infarcts last week as well as pulmonary emboli with DVT.             2/12-pt with increased left inattention and lethargy yesterday   -MRI yesterday revealed  progression of bi-frontal infarcts as well as new mall infarcts right lateral occ lobe   -?etiology, CT abdomen without malignancy   -?venous source given PFO given lack of other obvious etiologies. IVCF today as recommended by neurology   -    2. DVT/anticoagulation/Hx of PE: Eliquis             -antiplatelet therapy: N/A 3. Pain Management: Lidoderm patch change as directed  -hydrocodone  d/t ams  -sports cream  -try low dose tramadol 25mg  q8 prn  -left RTC/OA shoulder: had left shoulder injection 2 mos ago apparently   -pain worsened by left HP, capsulitis   -consider another injection at some point when more medically stable   -in the meantime shoulder ROM and support 4. Mood: Valium as needed for anxiety and depression             -antipsychotic agents: N/A 5. Neuropsych: This patient is capable of making decisions on her own behalf. 6. Skin/Wound Care: Routine skin checks 7. Fluids/Electrolytes/Nutrition: encourage PO  -2/8 K+ 3.6 8.  PFO.  Follow cardiology services.  TEE completed, suggesting PFO, await further Cards recs.. 9.  Hypertension.  Cardizem 120 mg daily, Lasix 20 mg daily, Cozaar 50 mg daily            2/12 allow permissive HTN for adequate cerebral perfusion. 10.  Hyperlipidemia: Continue Lipitor 11.  Diabetes mellitus with hyperglycemia.  Hemoglobin A1c 9.4.  SSI.  Patient on Glucophage 1000 mg twice daily prior to admission.  Resume as needed           CBG (last 3)  Recent Labs    09/23/19 1644 09/23/19 2124 09/24/19 0632  GLUCAP 146* 130* 143*  Controlled 09/24/2019   13.  CKD stage II.                2/8 BUN/Cr 8/0.6 14.  Restless leg syndrome.  Requip 2 mg 3 times daily--Reduced to HS d/t lethargy  2/11 home requip resumed d/t improved arousal.  15. BP difference between arms?  2/7- consulted Cardiology- he felt there were no interventions but have them take BP in LUE from now on- order placed.         LOS: 10 days A FACE TO FACE EVALUATION WAS  PERFORMED  Charlett Blake 09/24/2019, 9:16 AM

## 2019-09-24 NOTE — Progress Notes (Signed)
Occupational Therapy Note  Patient Details  Name: [name] [name] MRN: [id number] Date of Birth: November 20, 1950  Today's Date: 09/24/2019 OT Missed Time: 57 Minutes Missed Time Reason: Unavailable (comment)(IVC filter placement)  Pt missed 60 min skilled OT d/t off unit for IVC filter placement. Will follow up as available per POC   [facility] 09/24/2019, 11:08 AM

## 2019-09-24 NOTE — Progress Notes (Signed)
Patient's NPO status maintained after midnight for possible IVC filter placement today. CHG bath given at night 2/12 and in morning 2/13.

## 2019-09-24 NOTE — Progress Notes (Signed)
Occupational Therapy Session Note  Patient Details  Name: [name] [name] MRN: [id number] Date of Birth: 1951/05/04  Today's Date: 09/24/2019 OT Individual Time: 1130-1200 OT Individual Time Calculation (min): 30 min    Short Term Goals: Week 1:  OT Short Term Goal 1 (Week 1): Pt will complete sit<>stand with consistent mod A in preparation for BADL tasks OT Short Term Goal 1 - Progress (Week 1): Not met OT Short Term Goal 2 (Week 1): Pt will locate 2 grooming items on L side of the sink with min questioing cues OT Short Term Goal 2 - Progress (Week 1): Met OT Short Term Goal 3 (Week 1): Pt will complete 1 step of UB dressing task without verbal cues OT Short Term Goal 3 - Progress (Week 1): Progressing toward goal  Skilled Therapeutic Interventions/Progress Updates:    1:1.  Pt retruned from IVC filter placement with no restrictions on therapy. Pt with no pain reported and agreeable dressing. Pt more verbally and motorically responsive today. Pt with minimally improved initiation however significant termination deficits still present. Pt able to scan to midline this date to state color of selected shirt. Pt requires HOH A to iniate rolling and MAX A of 2 to change brief/don new pants. Pt able to wash under L arm pit spontaneously instead of washing face. Pt dons shirt total A with HOH A to pick up LUE with RUE to place into shirt and thread head into shirt. Pt completes steady transfer with MAX A +2 and pt initiating stand. At sink pt able to locate comb on L with manual facilitation of head turns. Pt combs both sides of head. Exited session with pt seated in TIS, tilted back, exi talarm on and call light in reach. BP 178/60 and RN aware.   Therapy Documentation Precautions:  Precautions Precautions: Fall Precaution Comments: recent L humeral fx, no orders Restrictions Weight Bearing Restrictions: No General: General OT Amount of Missed Time: 60 Minutes Vital Signs: Therapy  Vitals Pulse Rate: 79 Resp: 10 BP: 136/60 Patient Position (if appropriate): Lying Oxygen Therapy SpO2: 100 % O2 Device: Nasal Cannula O2 Flow Rate (L/min): 2 L/min End Tidal CO2 (EtCO2): 43 Pain: Pain Assessment Pain Scale: Faces Pain Score: 0-No pain Faces Pain Scale: No hurt ADL: ADL Eating: Minimal assistance Grooming: Minimal assistance Upper Body Bathing: Not assessed Lower Body Bathing: Not assessed Upper Body Dressing: Maximal assistance Lower Body Dressing: Maximal assistance, Dependent Toileting: Maximal assistance Toilet Transfer: Dependent, Maximal assistance Vision   Perception    Praxis   Exercises:   Other Treatments:     Therapy/Group: Individual Therapy  Tonny Branch 09/24/2019, 12:07 PM

## 2019-09-25 LAB — GLUCOSE, CAPILLARY
Glucose-Capillary: 120 mg/dL — ABNORMAL HIGH (ref 70–99)
Glucose-Capillary: 128 mg/dL — ABNORMAL HIGH (ref 70–99)
Glucose-Capillary: 125 mg/dL — ABNORMAL HIGH (ref 70–99)
Glucose-Capillary: 137 mg/dL — ABNORMAL HIGH (ref 70–99)

## 2019-09-25 NOTE — Progress Notes (Signed)
[place] PHYSICAL MEDICINE & REHABILITATION PROGRESS NOTE   Subjective/Complaints: Appreciate IR note, IVC filter placed using right IJ access, patient does not recall where filter was placed.  She is oriented to person.  She is awake and alert and follows simple commands  ROS: Patient denies cp SOB N/V/D  Objective:   IR IVC FILTER PLMT / S&I /IMG GUID/MOD SED  Result Date: 09/24/2019 CLINICAL DATA:  Right peroneal vein DVT, patent foramen ovale and persistent multiple cerebral infarcts with clinical suspicion of paradoxical embolization causing cerebral infarcts. Request has been made to place an IVC filter to try to decrease potential for additional paradoxical embolization. EXAM: 1. ULTRASOUND GUIDANCE FOR VASCULAR ACCESS OF THE RIGHT INTERNAL JUGULAR VEIN. 2. IVC VENOGRAM. 3. PERCUTANEOUS IVC FILTER PLACEMENT. ANESTHESIA/SEDATION: 1.5 mg IV Versed; 50 mcg IV Fentanyl. Total Moderate Sedation Time: 15 minutes. The patient's level of consciousness and physiologic status were continuously monitored during the procedure by Radiology nursing. CONTRAST:  41mL OMNIPAQUE IOHEXOL 300 MG/ML  SOLN FLUOROSCOPY TIME:  1 minutes and 48 seconds.  44.0 mGy. PROCEDURE: The procedure, risks, benefits, and alternatives were explained to the patient. Questions regarding the procedure were encouraged and answered. The patient understands and consents to the procedure. A time-out was performed prior to initiating the procedure. The right neck was prepped with chlorhexidine in a sterile fashion, and a sterile drape was applied covering the operative field. A sterile gown and sterile gloves were used for the procedure. Local anesthesia was provided with 1% Lidocaine. Ultrasound was utilized to confirm patency of the right internal jugular vein. Under direct ultrasound guidance, a 21 gauge needle was advanced into the right internal jugular vein with ultrasound image documentation performed. After securing access with a  micropuncture dilator, a guidewire was advanced into the inferior vena cava. A deployment sheath was advanced over the guidewire. This was utilized to perform IVC venography. The deployment sheath was further positioned in an appropriate location for filter deployment. A Bard Denali IVC filter was then advanced in the sheath. This was then fully deployed in the infrarenal IVC. Final filter position was confirmed with a fluoroscopic spot image. After the procedure the sheath was removed and hemostasis obtained with manual compression. COMPLICATIONS: None. FINDINGS: IVC venography demonstrates a normal caliber IVC with no evidence of thrombus. Renal veins are identified bilaterally. The IVC filter was successfully positioned below the level of the renal veins and is appropriately oriented. IMPRESSION: Placement of percutaneous IVC filter in infrarenal IVC. IVC venogram shows no evidence of IVC thrombus and normal caliber of the inferior vena cava. PLAN: Due to patient related comorbidities and/or clinical necessity, this IVC filter should be considered a permanent device. This patient will not be actively followed for future filter retrieval. Electronically Signed   By: Aletta Edouard M.D.   On: 09/24/2019 11:41   No results for input(s): WBC, HGB, HCT, PLT in the last 72 hours. Recent Labs    09/23/19 1637  NA 131*  K 3.7  CL 97*  CO2 20*  GLUCOSE 158*  BUN 28*  CREATININE 0.87  CALCIUM 9.4    Intake/Output Summary (Last 24 hours) at 09/25/2019 0926 Last data filed at 09/25/2019 0755 Gross per 24 hour  Intake 340 ml  Output 0 ml  Net 340 ml     Physical Exam: Vital Signs Blood pressure (!) 152/69, pulse 80, temperature 98 F (36.7 C), resp. rate 18, height 5\' 4"  (1.626 m), weight 83.6 kg, SpO2 95 %. Constitutional: No  distress . Vital signs reviewed. HEENT: EOMI, oral membranes moist Neck: supple Cardiovascular: RRR without murmur. No JVD    Respiratory: CTA Bilaterally without wheezes  or rales. Normal effort    GI: BS +, non-tender, non-distended  Musculoskeletal:   No pain with upper extremity range of motion Neurological: Oriented to person and place but not time no focal CN abnl. Left inattention is more severe  Motor: RUE/RLE: 4/5 proximal distal unchanged LUE: 0/5 LLE: 0/5  LUE. Sl extensor tone in LLE   Skin: Skin is warm and dry.  Psychiatric:flat  Assessment/Plan: 1. Functional deficits secondary to bicortical infarcts, Right frontal SAH which require 3+ hours per day of interdisciplinary therapy in a comprehensive inpatient rehab setting.  Physiatrist is providing close team supervision and 24 hour management of active medical problems listed below.  Physiatrist and rehab team continue to assess barriers to discharge/monitor patient progress toward functional and medical goals  Care Tool:  Bathing  Bathing activity did not occur: Refused           Bathing assist       Upper Body Dressing/Undressing Upper body dressing   What is the patient wearing?: Hospital gown only    Upper body assist Assist Level: Maximal Assistance - Patient 25 - 49%    Lower Body Dressing/Undressing Lower body dressing      What is the patient wearing?: Pants, Incontinence brief     Lower body assist Assist for lower body dressing: Total Assistance - Patient < 25%     Toileting Toileting    Toileting assist Assist for toileting: Dependent - Patient 0%     Transfers Chair/bed transfer  Transfers assist     Chair/bed transfer assist level: Dependent - mechanical lift     Locomotion Ambulation   Ambulation assist   Ambulation activity did not occur: Safety/medical concerns          Walk 10 feet activity   Assist  Walk 10 feet activity did not occur: Safety/medical concerns        Walk 50 feet activity   Assist Walk 50 feet with 2 turns activity did not occur: Safety/medical concerns         Walk 150 feet activity   Assist  Walk 150 feet activity did not occur: Safety/medical concerns         Walk 10 feet on uneven surface  activity   Assist Walk 10 feet on uneven surfaces activity did not occur: Safety/medical concerns         Wheelchair     Assist Will patient use wheelchair at discharge?: No(LT goals not set )             Wheelchair 50 feet with 2 turns activity    Assist            Wheelchair 150 feet activity     Assist          Blood pressure (!) 152/69, pulse 80, temperature 98 F (36.7 C), resp. rate 18, height 5\' 4"  ([id number] m), weight 83.6 kg, SpO2 95 %.  Medical Problem List and Plan: 1.  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 Patient with bilateral cerebral and right occipital infarcts last week as well as pulmonary emboli with DVT.             No neuro changes since yesterday   -MRI yesterday revealed progression of bi-frontal infarcts as well as new mall  infarcts right lateral occ lobe   -?etiology, CT abdomen without malignancy   -?venous source given PFO given lack of other obvious etiologies. IVCF today as recommended by neurology, completed by IR on 09/23/2018 right IJ route   -    2. DVT/anticoagulation/Hx of PE: Eliquis             -antiplatelet therapy: N/A 3. Pain Management: Lidoderm patch change as directed  -hydrocodone  d/t ams  -sports cream  -try low dose tramadol 25mg  q8 prn  -left RTC/OA shoulder: had left shoulder injection 2 mos ago apparently   -pain worsened by left HP, capsulitis   -consider another injection at some point when more medically stable   -in the meantime shoulder ROM and support 4. Mood: Valium as needed for anxiety and depression             -antipsychotic agents: N/A 5. Neuropsych: This patient is capable of making decisions on her own behalf. 6. Skin/Wound Care: Routine skin checks 7. Fluids/Electrolytes/Nutrition: encourage  PO  -2/8 K+ 3.6 8.  PFO.  Follow cardiology services.  TEE completed, suggesting PFO, await further Cards recs.. 9.  Hypertension.  Cardizem 120 mg daily, Lasix 20 mg daily, Cozaar 50 mg daily            2/12 allow permissive HTN for adequate cerebral perfusion. 10.  Hyperlipidemia: Continue Lipitor 11.  Diabetes mellitus with hyperglycemia.  Hemoglobin A1c 9.4.  SSI.  Patient on Glucophage 1000 mg twice daily prior to admission.  Resume as needed           CBG (last 3)  Recent Labs    09/24/19 1648 09/24/19 2110 09/25/19 0611  GLUCAP 123* 110* 120*  Controlled 09/25/2019   13.  CKD stage II.                2/8 BUN/Cr 8/0.6 14.  Restless leg syndrome.  Requip 2 mg 3 times daily--Reduced to HS d/t lethargy  2/11 home requip resumed d/t improved arousal.  15. BP difference between arms?  2/7- consulted Cardiology- he felt there were no interventions but have them take BP in LUE from now on- order placed.         LOS: 11 days A FACE TO FACE EVALUATION WAS PERFORMED  Charlett Blake 09/25/2019, 9:26 AM

## 2019-09-25 NOTE — Progress Notes (Signed)
Pt monitored q1h throughout shift for safety. Bed in lowest position with wheels locked and call bell within reach.

## 2019-09-26 ENCOUNTER — Other Ambulatory Visit: Payer: Self-pay | Admitting: *Deleted

## 2019-09-26 ENCOUNTER — Inpatient Hospital Stay (HOSPITAL_COMMUNITY): Payer: Medicare Other

## 2019-09-26 ENCOUNTER — Inpatient Hospital Stay (HOSPITAL_COMMUNITY): Payer: Medicare Other | Admitting: Physical Therapy

## 2019-09-26 ENCOUNTER — Other Ambulatory Visit: Payer: Self-pay

## 2019-09-26 ENCOUNTER — Inpatient Hospital Stay (HOSPITAL_COMMUNITY): Payer: Medicare Other | Admitting: Occupational Therapy

## 2019-09-26 ENCOUNTER — Inpatient Hospital Stay (HOSPITAL_COMMUNITY): Payer: Medicare Other | Admitting: Speech Pathology

## 2019-09-26 DIAGNOSIS — I2609 Other pulmonary embolism with acute cor pulmonale: Secondary | ICD-10-CM

## 2019-09-26 DIAGNOSIS — I6340 Cerebral infarction due to embolism of unspecified cerebral artery: Secondary | ICD-10-CM

## 2019-09-26 DIAGNOSIS — Q211 Atrial septal defect: Secondary | ICD-10-CM

## 2019-09-26 DIAGNOSIS — I82451 Acute embolism and thrombosis of right peroneal vein: Secondary | ICD-10-CM

## 2019-09-26 DIAGNOSIS — I6521 Occlusion and stenosis of right carotid artery: Secondary | ICD-10-CM

## 2019-09-26 LAB — GLUCOSE, CAPILLARY
Glucose-Capillary: 154 mg/dL — ABNORMAL HIGH (ref 70–99)
Glucose-Capillary: 163 mg/dL — ABNORMAL HIGH (ref 70–99)
Glucose-Capillary: 97 mg/dL (ref 70–99)

## 2019-09-26 NOTE — Progress Notes (Signed)
[place] PHYSICAL MEDICINE & REHABILITATION PROGRESS NOTE   Subjective/Complaints: Up with SLP. No complaints. Taking meds. Discussed that her mind often is distracted by the thoughts of her husband's suicide  ROS: limited d/t cognitive   Objective:   IR IVC FILTER PLMT / S&I /IMG GUID/MOD SED  Result Date: 09/24/2019 CLINICAL DATA:  Right peroneal vein DVT, patent foramen ovale and persistent multiple cerebral infarcts with clinical suspicion of paradoxical embolization causing cerebral infarcts. Request has been made to place an IVC filter to try to decrease potential for additional paradoxical embolization. EXAM: 1. ULTRASOUND GUIDANCE FOR VASCULAR ACCESS OF THE RIGHT INTERNAL JUGULAR VEIN. 2. IVC VENOGRAM. 3. PERCUTANEOUS IVC FILTER PLACEMENT. ANESTHESIA/SEDATION: 1.5 mg IV Versed; 50 mcg IV Fentanyl. Total Moderate Sedation Time: 15 minutes. The patient's level of consciousness and physiologic status were continuously monitored during the procedure by Radiology nursing. CONTRAST:  7mL OMNIPAQUE IOHEXOL 300 MG/ML  SOLN FLUOROSCOPY TIME:  1 minutes and 48 seconds.  44.0 mGy. PROCEDURE: The procedure, risks, benefits, and alternatives were explained to the patient. Questions regarding the procedure were encouraged and answered. The patient understands and consents to the procedure. A time-out was performed prior to initiating the procedure. The right neck was prepped with chlorhexidine in a sterile fashion, and a sterile drape was applied covering the operative field. A sterile gown and sterile gloves were used for the procedure. Local anesthesia was provided with 1% Lidocaine. Ultrasound was utilized to confirm patency of the right internal jugular vein. Under direct ultrasound guidance, a 21 gauge needle was advanced into the right internal jugular vein with ultrasound image documentation performed. After securing access with a micropuncture dilator, a guidewire was advanced into the inferior  vena cava. A deployment sheath was advanced over the guidewire. This was utilized to perform IVC venography. The deployment sheath was further positioned in an appropriate location for filter deployment. A Bard Denali IVC filter was then advanced in the sheath. This was then fully deployed in the infrarenal IVC. Final filter position was confirmed with a fluoroscopic spot image. After the procedure the sheath was removed and hemostasis obtained with manual compression. COMPLICATIONS: None. FINDINGS: IVC venography demonstrates a normal caliber IVC with no evidence of thrombus. Renal veins are identified bilaterally. The IVC filter was successfully positioned below the level of the renal veins and is appropriately oriented. IMPRESSION: Placement of percutaneous IVC filter in infrarenal IVC. IVC venogram shows no evidence of IVC thrombus and normal caliber of the inferior vena cava. PLAN: Due to patient related comorbidities and/or clinical necessity, this IVC filter should be considered a permanent device. This patient will not be actively followed for future filter retrieval. Electronically Signed   By: Aletta Edouard M.D.   On: 09/24/2019 11:41   No results for input(s): WBC, HGB, HCT, PLT in the last 72 hours. Recent Labs    09/23/19 1637  NA 131*  K 3.7  CL 97*  CO2 20*  GLUCOSE 158*  BUN 28*  CREATININE 0.87  CALCIUM 9.4    Intake/Output Summary (Last 24 hours) at 09/26/2019 0947 Last data filed at 09/26/2019 0844 Gross per 24 hour  Intake 460 ml  Output --  Net 460 ml     Physical Exam: Vital Signs Blood pressure (!) 176/66, pulse 85, temperature 97.9 F (36.6 C), resp. rate 20, height 5\' 4"  (1.626 m), weight 83.6 kg, SpO2 100 %. Constitutional: No distress . Vital signs reviewed. HEENT: EOMI, oral membranes moist Neck: supple Cardiovascular: RRR without  murmur. No JVD    Respiratory: CTA Bilaterally without wheezes or rales. Normal effort    GI: BS +, non-tender, non-distended    Musculoskeletal:   No pain with upper extremity range of motion Neurological: oriented to place, reason she's here. Remembered why ivcf was placed.  Left inattention is more severe  Motor: RUE/RLE: 4/5 proximal distal unchanged LUE: 0/5 with increased flexor tone LLE: 0/5 increased extensor tone     Skin: Skin is warm and dry.  Psychiatric:flat  Assessment/Plan: 1. Functional deficits secondary to bicortical infarcts, Right frontal SAH which require 3+ hours per day of interdisciplinary therapy in a comprehensive inpatient rehab setting.  Physiatrist is providing close team supervision and 24 hour management of active medical problems listed below.  Physiatrist and rehab team continue to assess barriers to discharge/monitor patient progress toward functional and medical goals  Care Tool:  Bathing  Bathing activity did not occur: Refused           Bathing assist       Upper Body Dressing/Undressing Upper body dressing   What is the patient wearing?: Hospital gown only    Upper body assist Assist Level: Maximal Assistance - Patient 25 - 49%    Lower Body Dressing/Undressing Lower body dressing      What is the patient wearing?: Pants, Incontinence brief     Lower body assist Assist for lower body dressing: Total Assistance - Patient < 25%     Toileting Toileting    Toileting assist Assist for toileting: Dependent - Patient 0%     Transfers Chair/bed transfer  Transfers assist     Chair/bed transfer assist level: Dependent - mechanical lift     Locomotion Ambulation   Ambulation assist   Ambulation activity did not occur: Safety/medical concerns          Walk 10 feet activity   Assist  Walk 10 feet activity did not occur: Safety/medical concerns        Walk 50 feet activity   Assist Walk 50 feet with 2 turns activity did not occur: Safety/medical concerns         Walk 150 feet activity   Assist Walk 150 feet activity did not  occur: Safety/medical concerns         Walk 10 feet on uneven surface  activity   Assist Walk 10 feet on uneven surfaces activity did not occur: Safety/medical concerns         Wheelchair     Assist Will patient use wheelchair at discharge?: No(LT goals not set )             Wheelchair 50 feet with 2 turns activity    Assist            Wheelchair 150 feet activity     Assist          Blood pressure (!) 176/66, pulse 85, temperature 97.9 F (36.6 C), resp. rate 20, height 5\' 4"  (1.626 m), weight 83.6 kg, SpO2 100 %.  Medical Problem List and Plan: 1.  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 Patient with bilateral cerebral and right occipital infarcts last week as well as pulmonary emboli with DVT.             No neuro changes since yesterday   -MRI yesterday revealed progression of bi-frontal infarcts as well as new mall infarcts right lateral occ lobe   -?etiology, CT abdomen without malignancy   -?  venous source given PFO given lack of other obvious etiologies. IVCF placed as recommended by neurology, completed by IR on 09/23/2018 right IJ route   -    2. DVT/anticoagulation/Hx of PE: Eliquis             -antiplatelet therapy: N/A 3. Pain Management: Lidoderm patch change as directed  -hydrocodone  d/t ams  -sports cream  -  low dose tramadol 25mg  q8 prn  -left RTC/OA shoulder: had left shoulder injection 2 mos ago apparently   -pain worsened by left HP, capsulitis   -consider another injection at some point when more medically stable   -continue shoulder ROM and support  -emerging tone LUE/LLE- ROM/orthotics for now   -WHO for LUE   -hesitate medicating d/t recent lethargy 4. Mood: Valium as needed for anxiety and depression             -antipsychotic agents: N/A  -expressing some anxiety/depression/distraction d/t husband's suicide. Has difficulty disengaging  from thoughts at times.    -neuropsych input might be helpful   -on lexapro 10mg  daily currently 5. Neuropsych: This patient is capable of making decisions on her own behalf. 6. Skin/Wound Care: Routine skin checks 7. Fluids/Electrolytes/Nutrition: encourage PO  -2/12 k+ 3.7 8.  PFO.  Follow cardiology services.  TEE completed, suggesting PFO, await further Cards recs.. 9.  Hypertension.  Cardizem 120 mg daily, Lasix 20 mg daily, Cozaar 50 mg daily            2/15  allow permissive HTN for adequate cerebral perfusion. 10.  Hyperlipidemia: Continue Lipitor 11.  Diabetes mellitus with hyperglycemia.  Hemoglobin A1c 9.4.  SSI.  Patient on Glucophage 1000 mg twice daily prior to admission.  Resume as needed           CBG (last 3)  Recent Labs    09/25/19 1122 09/25/19 1649 09/25/19 2117  GLUCAP 128* 125* 137*  Controlled 09/26/2019-no changes today   13.  CKD stage II.                2/8 BUN/Cr 8/0.6 14.  Restless leg syndrome.  Requip 2 mg 3 times daily--Initially Reduced to HS d/t lethargy  2/11 home requip resumed d/t improved arousal.  15. BP difference between arms?  2/7- consulted Cardiology- he felt there were no interventions but have them take BP in LUE from now on- order placed.         LOS: 12 days A FACE TO FACE EVALUATION WAS PERFORMED  Meredith Staggers 09/26/2019, 9:47 AM

## 2019-09-26 NOTE — Progress Notes (Signed)
Speech Language Pathology Daily Session Note  Patient Details  Name: KAMARA PRIMO MRN: [id number] Date of Birth: 08-Nov-1950  Today's Date: 09/26/2019 SLP Individual Time: [id number] SLP Individual Time Calculation (min): 55 min  Short Term Goals: Week 2: SLP Short Term Goal 1 (Week 2): Pt will sustain attention to tasks for 5 minute intervals with Mod A cues for redirection. SLP Short Term Goal 2 (Week 2): Pt will recall new and/or complex daily information with Mod A verbal/visual cues for use of compensatory strategies. SLP Short Term Goal 3 (Week 2): Pt will demonstrate problem solving during basic tasks with Mod A verbal/visual cues. SLP Short Term Goal 4 (Week 2): Pt will demonstrate emergent awareness by detecting errors during functional tasks with Max A verbal/visual cues. SLP Short Term Goal 5 (Week 2): Patient will scan to her left field of envionrment during functional tasks with Max A multimodal cues.  Skilled Therapeutic Interventions: Skilled treatment session focused on cognitive goals. SLP facilitated session by providing extra time and Max A multimodal cues for initiation and sustained attention to a sorting task. Patient able to generate specific amounts of change with extra time and overall Mod A verbal cues for problem solving. Extra time and hand over hand assist were needed for initiation of tasking her medications whole with thin. Patient completed task with extra time (RN present).  Patient left upright in bed with alarm on and all needs within reach. Continue with current plan of care.      Pain No/Denies Pain   Therapy/Group: Individual Therapy  Christien Berthelot 09/26/2019, 12:38 PM

## 2019-09-26 NOTE — Patient Outreach (Signed)
[facility]) Care Management  09/26/2019  MCKINNLEY BALLINGER Sep 11, 1950 [id number]   [facility] closure due to inpatient status.  Ronn Melena, BSW Social Worker [phone]

## 2019-09-26 NOTE — Progress Notes (Signed)
Occupational Therapy Session Note  Patient Details  Name: [name] [name] MRN: [id number] Date of Birth: 04-29-1951  Today's Date: 09/26/2019 OT Individual Time: 1415-1540 OT Individual Time Calculation (min): 85 min    Skilled Therapeutic Interventions/Progress Updates:    Pt received supine with soiled brief. Pt rolled R with min A and required total A for peri hygiene to clean loose BM. Pt unaware of BM. Pt completed anterior peri hygiene with min A for thoroughness, requiring tactile cueing to initiate. Pt transitioned to EOB with total +2 assist. Max A for static sitting balance. Charlaine Dalton was used to transfer pt- pt able to stand from EOB in stedy with mod +2 assist. Pt sat in TIS w/c and completed oral care with set up assist. Increased time/cueing provided for pt to scan L and midline. Pt completed UB bathing with min A overall, good L body awareness, initiating washing under L arm without cueing. Pt still with painful L shoulder with any shoulder flexion and increased tone overall. Pt donned new gown with mod A. Pt was taken down to the therapy gym where she completed sit > stand in the stedy. She sat perched to improve BLE weightbearing and completed R functional reaching to promote reduced L lean. Max cueing required to attend to and initiate/terminate task. Pt was returned to her room and with stedy returned to supine in bed. Pt was set up to eat lunch. Min A overall required to eat lunch. [name] arrived at end of session and discussion was initiated re CLOF and d/c planning. Family is very supportive and cares deeply about pt, wanting best for her but understanding current burden of care. Updates likely after team conference meeting tomorrow. Pt was left supine with all needs met, bed alarm set.   Therapy Documentation Precautions:  Precautions Precautions: Fall Precaution Comments: recent L humeral fx, no orders Restrictions Weight Bearing Restrictions: No  Therapy/Group: Individual  Therapy  CICLEY GANESH 09/26/2019, 4:39 PM

## 2019-09-26 NOTE — Progress Notes (Signed)
Physical Therapy Session Note  Patient Details  Name: [name] [name] MRN: [id number] Date of Birth: June 15, 1951  Today's Date: 09/26/2019 PT Individual Time: 1005-1058 PT Individual Time Calculation (min): 53 min   Short Term Goals: Week 2:  PT Short Term Goal 1 (Week 2): Pt will perform bed mobility w/ max assist +1 PT Short Term Goal 2 (Week 2): Pt will perform sit<>stand w/ max assist +1 PT Short Term Goal 3 (Week 2): Pt will maintain static sitting balance w/ CGA PT Short Term Goal 4 (Week 2): Pt will initiate functional mobility w/ min cues 50% of the time  Skilled Therapeutic Interventions/Progress Updates:   Pt in supine and agreeable to therapy, no c/o pain. Pt noted to be incontinent of bowel. R/L rolling w/ total assist w/ verbal/tactile cues for technique while 2nd helper performed pericare and brief management. Pt will attend to L environment w/ auditory stimuli today, continues to need max cues for impaired motor planning. Supine>sit w/ total assist, max verbal, manual, and tactile cues for technique. Pt initiated movement, however unable to break R extensor pattern w/ volitional activity. Mod-max assist for static sitting balance at EOB and 2/2 posterior lean bias. Able to weight shift forward at trunk w/ increased time. +2 stedy transfer to TIS. Total assist w/c transport to/from therapy gym. Worked on sit<>stands in stedy w/ mirror for visual feedback of midline. Performed mod-max assist +2 sit<>stand and to remain standing, x4-5 reps. Multimodal cues, including mirror, for midline. Pt most successful when fixating on posture in mirror or w/ external cues such as "lift your head to the ceiling". Pt w/ mild pushing to L, unable to bring L knee off stedy support either. Stood 15-30 sec at a time. Therapist performed passive L knee extension and L elbow extension ROM and stretching during seated rest breaks. Pt unable to achieve full extension of either 2/2 increased flexor tone and  mild pain/discomfort w/ stretching. Transitioned to working on sit<>stands at rail. Made multiple attempts, pt pushing into R body extension w/ volitional movement w/ this task as well. Eventually able to get to standing w/ total +2 assist. Unable to reach full upright. Worked on L attention and BLE reciprocal movement pattern on kinetron. Performed w/ mod-max assist to facilitate, however palpable muscle contraction of LLE. Performed multiple reps of 30-60 sec. Pt w/ decreased participation and stated "I'm done". Ended session tilted in TIS at nurses station and in direct care of nurse, all needs in reach.   Therapy Documentation Precautions:  Precautions Precautions: Fall Precaution Comments: recent L humeral fx, no orders Restrictions Weight Bearing Restrictions: No  Therapy/Group: Individual Therapy  Deloss Amico K Azai Gaffin 09/26/2019, 11:03 AM

## 2019-09-26 NOTE — Patient Outreach (Signed)
[facility]) Care Management  09/26/2019  [name] [name] [birth year] [id number]   Case status changed to not active per [facility] Liaison, [place] Brewer's request.    Colbert Coyer. Annia Friendly, BSN, [place] [facility] Telephonic CM Phone: [phone] Fax: [phone]

## 2019-09-26 NOTE — Progress Notes (Signed)
Occupational Therapy Session Note  Patient Details  Name: [name] [name] MRN: [id number] Date of Birth: 1951/04/03  Today's Date: 09/26/2019 OT Individual Time: 0710-0720 OT Individual Time Calculation (min): 10 min  and Today's Date: 09/26/2019 OT Missed Time: 20 Minutes Missed Time Reason: Patient unwilling/refused to participate without medical reason   Short Term Goals: Week 1:  OT Short Term Goal 1 (Week 1): Pt will complete sit<>stand with consistent mod A in preparation for BADL tasks OT Short Term Goal 1 - Progress (Week 1): Not met OT Short Term Goal 2 (Week 1): Pt will locate 2 grooming items on L side of the sink with min questioing cues OT Short Term Goal 2 - Progress (Week 1): Met OT Short Term Goal 3 (Week 1): Pt will complete 1 step of UB dressing task without verbal cues OT Short Term Goal 3 - Progress (Week 1): Progressing toward goal Week 2:  OT Short Term Goal 1 (Week 2): Pt will complete 1 step of UB dressing to demo improved apraxia OT Short Term Goal 2 (Week 2): Pt will sit EOB 5 min wiht MIN A for sitting balnace OT Short Term Goal 3 (Week 2): Pt will locate items on sink/tray on L with no more than 1 VC OT Short Term Goal 4 (Week 2): Pt will sit to stand in stedy wiht MAX A of 1 to decrease BOC  Skilled Therapeutic Interventions/Progress Updates:    Upon entering the room, pt supine in bed with covers thrown off and brief pulled down. Pt allowing therapist to pull gown back down for modesty but refused any other self care. OT providing maximal encouragement for participation but pt continues to decline session. Pt remained in bed with bed alarm activated and call bell within reach upon exiting the room.   Therapy Documentation Precautions:  Precautions Precautions: Fall Precaution Comments: recent L humeral fx, no orders Restrictions Weight Bearing Restrictions: No General: General OT Amount of Missed Time: 20 Minutes Vital Signs: Therapy Vitals Temp:  97.9 F (36.6 C) Pulse Rate: 85 BP: (!) 176/66 Patient Position (if appropriate): Lying Oxygen Therapy SpO2: 100 % O2 Device: Room Air Pain: Pain Assessment Pain Scale: 0-10 Pain Score: 4  Pain Type: Acute pain Pain Location: Head Pain Orientation: Anterior Pain Descriptors / Indicators: Headache Pain Frequency: Intermittent Pain Onset: Gradual Patients Stated Pain Goal: 0 Pain Intervention(s): Medication (See eMAR) ADL: ADL Eating: Minimal assistance Grooming: Minimal assistance Upper Body Bathing: Not assessed Lower Body Bathing: Not assessed Upper Body Dressing: Maximal assistance Lower Body Dressing: Maximal assistance, Dependent Toileting: Maximal assistance Toilet Transfer: Dependent, Maximal assistance   Therapy/Group: Individual Therapy  Gypsy Decant 09/26/2019, 7:23 AM

## 2019-09-27 ENCOUNTER — Inpatient Hospital Stay (HOSPITAL_COMMUNITY): Payer: Medicare Other | Admitting: Occupational Therapy

## 2019-09-27 ENCOUNTER — Inpatient Hospital Stay (HOSPITAL_COMMUNITY): Payer: Medicare Other

## 2019-09-27 ENCOUNTER — Encounter (HOSPITAL_COMMUNITY): Payer: Medicare Other | Admitting: Psychology

## 2019-09-27 ENCOUNTER — Inpatient Hospital Stay (HOSPITAL_COMMUNITY): Payer: Medicare Other | Admitting: Physical Therapy

## 2019-09-27 ENCOUNTER — Inpatient Hospital Stay (HOSPITAL_COMMUNITY): Payer: Medicare Other | Admitting: Speech Pathology

## 2019-09-27 LAB — GLUCOSE, CAPILLARY
Glucose-Capillary: 143 mg/dL — ABNORMAL HIGH (ref 70–99)
Glucose-Capillary: 134 mg/dL — ABNORMAL HIGH (ref 70–99)
Glucose-Capillary: 158 mg/dL — ABNORMAL HIGH (ref 70–99)
Glucose-Capillary: 178 mg/dL — ABNORMAL HIGH (ref 70–99)
Glucose-Capillary: 142 mg/dL — ABNORMAL HIGH (ref 70–99)
Glucose-Capillary: 100 mg/dL — ABNORMAL HIGH (ref 70–99)

## 2019-09-27 MED ORDER — TIZANIDINE HCL 2 MG PO TABS
1.0000 mg | ORAL_TABLET | Freq: Three times a day (TID) | ORAL | Status: DC
  Administered 2019-09-27: 13:00:00 1 mg via ORAL
  Filled 2019-09-27: qty 1

## 2019-09-27 MED ORDER — SODIUM CHLORIDE 0.9 % IV BOLUS
500.0000 mL | Freq: Once | INTRAVENOUS | Status: AC
  Administered 2019-09-27: 500 mL via INTRAVENOUS

## 2019-09-27 MED ORDER — POTASSIUM CHLORIDE IN NACL 20-0.9 MEQ/L-% IV SOLN
INTRAVENOUS | Status: DC
  Administered 2019-09-29: 10:00:00 1000 mL via INTRAVENOUS
  Filled 2019-09-27 (×5): qty 1000

## 2019-09-27 MED ORDER — SODIUM CHLORIDE 0.9 % IV SOLN: INTRAVENOUS | Status: DC

## 2019-09-27 MED ORDER — LEVETIRACETAM IN NACL 500 MG/100ML IV SOLN: 500.0000 mg | Freq: Two times a day (BID) | INTRAVENOUS | Status: DC

## 2019-09-27 MED ORDER — LORAZEPAM 2 MG/ML IJ SOLN
1.0000 mg | Freq: Once | INTRAMUSCULAR | Status: AC
  Administered 2019-09-27: 1 mg via INTRAVENOUS

## 2019-09-27 MED ORDER — LEVETIRACETAM IN NACL 1000 MG/100ML IV SOLN
1000.0000 mg | Freq: Two times a day (BID) | INTRAVENOUS | Status: DC
  Administered 2019-09-27 – 2019-09-30 (×6): 1000 mg via INTRAVENOUS
  Filled 2019-09-27 (×7): qty 100

## 2019-09-27 MED FILL — Medication: Qty: 1 | Status: AC

## 2019-09-27 NOTE — Significant Event (Signed)
Rapid Response Event Note  Overview: Neurologic - Unresponsive   Initial Focused Assessment: Patient was working with OT and became unresponsive and hypotensive -coupled with LT sided gaze preference. Per staff her head was also turned to the LT. Upon arrival, staff informed that they had administered Zanaflex. Staff had already administered Ativan 1 mg IM and there was no improvement in her mental status. Upon arrival, I quickly placed on PIV in her and started a bolus, she was breathing on her own but her jaw was clenched and she still appeared to be seizing. Her lips were blue and I was concerned about airway protection, I immediately called a Code Blue for airway support and logistical support.  RT assisted with ventilations for a few minutes, her color improved, and then the patient was more awake and we transitioned her to NRB 15L 100%. BP improved and her saturations were 100% and I weaned the oxygen off. Her neuro exam is quite hard to discern, she is flaccid on the LT side and has LT sided hemianopsia but has had significant apraxia from staff so often cannot follow commands on the RT side. The episode lasted at least 15 minutes per staff. Patient started to talk, could tell her name, birthdate - age, and where is was. Could stick her tongue to command. Neuro MD to bedside. Code Blue arrived earlier and their assistance was appreciated. Rehab PA and MD were the bedside during the event as well  Interventions: -- PIV -- 500 cc NS bolus  -- Keppra 1gm IV x 1 -- STAT CT Head    Plan of Care: -- F/U with CT -- EEG  -- Monitor neurologic status -- Rest per medical team   Event Summary:  Call Time 1346 Arrival Time 1349  End Time 1455   Kelechi Astarita R

## 2019-09-27 NOTE — Progress Notes (Signed)
  Patient Name: [name] [name]   MRN: [id number]   Date of Birth/ Sex: [birth year] , female      Admission Date: 09/14/2019  Attending Provider: Meredith Staggers, MD  Primary Diagnosis: Acute bilat watershed infarction [facility])   Indication: Pt was in her usual state of health until approx 1:30 PM, when she was noted to be unresponsive, with gaze deviated to left. Code blue was subsequently called. At the time of arrival on scene, ACLS protocol was underway, but stopped as patient began to slowly regain consciousness. Rapid response continued.   Technical Description:  - CPR performance duration:  0 minute  - Was defibrillation or cardioversion used? No   - Was external pacer placed? No  - Was patient intubated pre/post CPR? No   Medications Administered: Y = Yes; Blank = No Amiodarone    Atropine    Calcium    Epinephrine    Lidocaine    Magnesium    Norepinephrine    Phenylephrine    Sodium bicarbonate    Vasopressin     Post CPR evaluation:  - Final Status - Was patient successfully resuscitated ? Yes - What is current rhythm? NSR - What is current hemodynamic status? Stable  Miscellaneous Information:  - Labs sent, including: CBC, BMP  - Primary team notified?  Yes- at bedside  - Family Notified? No  - Additional notes/ transfer status:  Dr. Leonel Ramsay, neurology, at bedside, likely seizure s/p xanaflex in setting of bilateral watershed infarcts and stable SAH. Patient receiving 1g Keppra loading dose now.     Gladys Damme, MD  09/27/2019, 2:26 PM

## 2019-09-27 NOTE — Progress Notes (Signed)
EEG complete - results pending 

## 2019-09-27 NOTE — Progress Notes (Signed)
Pt unavailable due to going to CT scan - will check back as schedule permits.

## 2019-09-27 NOTE — Progress Notes (Signed)
EEG being completed now

## 2019-09-27 NOTE — Progress Notes (Signed)
Occupational Therapy Session Note  Patient Details  Name: [name] [name] MRN: [id number] Date of Birth: [birth year]  Today's Date: 09/27/2019 OT Individual Time: 1305-1330 OT Individual Time Calculation (min): 25 min    Short Term Goals: Week 1:  OT Short Term Goal 1 (Week 1): Pt will complete sit<>stand with consistent mod A in preparation for BADL tasks OT Short Term Goal 1 - Progress (Week 1): Not met OT Short Term Goal 2 (Week 1): Pt will locate 2 grooming items on L side of the sink with min questioing cues OT Short Term Goal 2 - Progress (Week 1): Met OT Short Term Goal 3 (Week 1): Pt will complete 1 step of UB dressing task without verbal cues OT Short Term Goal 3 - Progress (Week 1): Progressing toward goal Week 2:  OT Short Term Goal 1 (Week 2): Pt will complete 1 step of UB dressing to demo improved apraxia OT Short Term Goal 2 (Week 2): Pt will sit EOB 5 min wiht MIN A for sitting balnace OT Short Term Goal 3 (Week 2): Pt will locate items on sink/tray on L with no more than 1 VC OT Short Term Goal 4 (Week 2): Pt will sit to stand in stedy wiht MAX A of 1 to decrease BOC Week 3:    Week 4:     Skilled Therapeutic Interventions/Progress Updates:    Hanger came to fit pt for rest hand splint and signed for it. Pt has just finished feeding herself lunch and was resting in tilt in space w/c. Attempted to transition pt into bathroom for timed toileting and possible shower and dressing. Pt required total A to get into STEDY. Stopped at sink in front of mirror to focus on trunk posture at midline- requiring mod to total A. Transitioned into bathroom and then became more unresponsive.. Taken out and back to EOB and began to take vitals. Pt becoming diaphoretic and NT came in and Nursing later. Initially code called. See flow sheets for vitals. Pt with gaze deviated to the left and unable to break gaze. Left pt with code team.   Therapy Documentation Precautions:   Precautions Precautions: Fall Precaution Comments: recent L humeral fx, no orders Restrictions Weight Bearing Restrictions: No General: General OT Amount of Missed Time: 35 Minutes PT Missed Treatment Reason: MD hold (Comment)(Rapid response) Vital Signs: Therapy Vitals Pulse Rate: 67 Resp: (!) 21 BP: (!) 120/33 Patient Position (if appropriate): Lying Oxygen Therapy SpO2: 97 % O2 Device: Room Air Pain:  no indications of pain  Therapy/Group: Individual Therapy  Willeen Cass [facility] 09/27/2019, 3:27 PM

## 2019-09-27 NOTE — Progress Notes (Signed)
Late entry, Nurse called to room while OT in room approx 1350. Pt at side of bed after therapy attempted to get with stedy, Pt noted with left gaze, pt placed back to bed, NP called with call to rapid, pt became unable to respond, eyes unresponsive with nystagmus  , pt noted with difficulty breathing with lips turning blue, pt was provided O2 with rebreather, ativan administered IM to right thigh, iv placed with fluids running with bolus, Keppra iv administered, pt became responsive and oriented to self while keppra infusing. Pt taken to CT with EEG pending. Dr Dagoberto Ligas and P. Love, PA at bedside. Pt resting in bed at this time with needs met.

## 2019-09-27 NOTE — Progress Notes (Addendum)
Late note for event this afternoon around 1:45 pm. Contacted by nurse regarding patient's unresponsiveness with left gaze preference. She was in STEDY with OT when she became unresponsive. SBP 60's initially and was in 80's for about 30 minutes. Head turned to left with left gaze preference and unresponsiveness. Had received Zanaflex 45 minutes before--hypotension likely SE also concerns of lowered seizure threshold.  Respiratory status initially stable with HOB 30 degrees but with puffing breaths.  Dr. Alveta Heimlich paged and present. Ativan 1 mg ordered but no  IV access therefore given IM without improvement. Nacl Bolus and IV Keppra 1000 mg ordered as patient without response to this. Rapid RN activated.  When [facility] lowered lips turned blue --NRBM placed and code blue activated briefly. Neurology consulted due to status epilepticus--episode lasted approx 15+ minutes before patient started showing slow arousal. Stat CT head and EEG ordered.  Son updated on events

## 2019-09-27 NOTE — Progress Notes (Signed)
Speech Language Pathology Daily Session Note  Patient Details  Name: [name] [name] MRN: [id number] Date of Birth: Nov 26, 1950  Today's Date: 09/27/2019 SLP Individual Time: 0725-0825 SLP Individual Time Calculation (min): 60 min  Short Term Goals: Week 2: SLP Short Term Goal 1 (Week 2): Pt will sustain attention to tasks for 5 minute intervals with Mod A cues for redirection. SLP Short Term Goal 2 (Week 2): Pt will recall new and/or complex daily information with Mod A verbal/visual cues for use of compensatory strategies. SLP Short Term Goal 3 (Week 2): Pt will demonstrate problem solving during basic tasks with Mod A verbal/visual cues. SLP Short Term Goal 4 (Week 2): Pt will demonstrate emergent awareness by detecting errors during functional tasks with Max A verbal/visual cues. SLP Short Term Goal 5 (Week 2): Patient will scan to her left field of envionrment during functional tasks with Max A multimodal cues.  Skilled Therapeutic Interventions: Skilled treatment session focused on cognitive goals. SLP facilitated session by providing Mod A verbal cues for attention to left field of environment and initiation with tray set-up.  Patient able to self-feed but required Mod verbal cues for sustained attention to task. SLP also facilitated session by providing Max-total A for scanning to left field of environment while crossing out days that have already happened on a calendar to maximize orientation to date. Patient was incontinent of bowel without awareness and required total A for self-care. Patient left upright in bed with all needs within reach and alarm on. Continue with current plan of care.      Pain No/Denies Pain   Therapy/Group: Individual Therapy  [name] [name] 09/27/2019, 10:05 AM

## 2019-09-27 NOTE — Progress Notes (Signed)
[place] PHYSICAL MEDICINE & REHABILITATION PROGRESS NOTE   Subjective/Complaints: In bed. Denies new pain. Slept   ROS: Patient denies fever, rash, sore throat, blurred vision, nausea, vomiting, diarrhea, cough, shortness of breath or chest pain, joint or back pain, headache, or mood change.    Objective:   No results found. No results for input(s): WBC, HGB, HCT, PLT in the last 72 hours. No results for input(s): NA, K, CL, CO2, GLUCOSE, BUN, CREATININE, CALCIUM in the last 72 hours.  Intake/Output Summary (Last 24 hours) at 09/27/2019 0952 Last data filed at 09/27/2019 0803 Gross per 24 hour  Intake 480 ml  Output --  Net 480 ml     Physical Exam: Vital Signs Blood pressure (!) 142/59, pulse 81, temperature 98.2 F (36.8 C), temperature source Oral, resp. rate 20, height 5\' 4"  (1.626 m), weight 83.6 kg, SpO2 94 %. Constitutional: No distress . Vital signs reviewed. HEENT: EOMI, oral membranes moist Neck: supple Cardiovascular: RRR without murmur. No JVD    Respiratory: CTA Bilaterally without wheezes or rales. Normal effort    GI: BS +, non-tender, non-distended  Musculoskeletal:   No pain with upper extremity range of motion Neurological: oriented to place, reason she's here. Remembered why ivcf was placed.  Left inattention is more severe  Motor: RUE/RLE: 4/5 proximal distal unchanged LUE: 0/5 with increased flexor tone LLE: 0/5 increased extensor tone     Skin: Skin is warm and dry.  Psychiatric:flat  Assessment/Plan: 1. Functional deficits secondary to bicortical infarcts, Right frontal SAH which require 3+ hours per day of interdisciplinary therapy in a comprehensive inpatient rehab setting.  Physiatrist is providing close team supervision and 24 hour management of active medical problems listed below.  Physiatrist and rehab team continue to assess barriers to discharge/monitor patient progress toward functional and medical goals  Care Tool:  Bathing   Bathing activity did not occur: Refused           Bathing assist       Upper Body Dressing/Undressing Upper body dressing   What is the patient wearing?: Hospital gown only    Upper body assist Assist Level: Maximal Assistance - Patient 25 - 49%    Lower Body Dressing/Undressing Lower body dressing      What is the patient wearing?: Pants, Incontinence brief     Lower body assist Assist for lower body dressing: Total Assistance - Patient < 25%     Toileting Toileting    Toileting assist Assist for toileting: Dependent - Patient 0%     Transfers Chair/bed transfer  Transfers assist     Chair/bed transfer assist level: Dependent - mechanical lift     Locomotion Ambulation   Ambulation assist   Ambulation activity did not occur: Safety/medical concerns          Walk 10 feet activity   Assist  Walk 10 feet activity did not occur: Safety/medical concerns        Walk 50 feet activity   Assist Walk 50 feet with 2 turns activity did not occur: Safety/medical concerns         Walk 150 feet activity   Assist Walk 150 feet activity did not occur: Safety/medical concerns         Walk 10 feet on uneven surface  activity   Assist Walk 10 feet on uneven surfaces activity did not occur: Safety/medical concerns         Wheelchair     Assist Will patient use wheelchair at  discharge?: No(LT goals not set )             Wheelchair 50 feet with 2 turns activity    Assist            Wheelchair 150 feet activity     Assist          Blood pressure (!) 142/59, pulse 81, temperature 98.2 F (36.8 C), temperature source Oral, resp. rate 20, height 5\' 4"  (1.626 m), weight 83.6 kg, SpO2 94 %.  Medical Problem List and Plan: 1.  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 Patient with bilateral cerebral and right occipital infarcts last week as  well as pulmonary emboli with DVT.             No neuro changes since yesterday   -MRI yesterday revealed progression of bi-frontal infarcts as well as new mall infarcts right lateral occ lobe   -?etiology, CT abdomen without malignancy   -?venous source given PFO given lack of other obvious etiologies. IVCF placed as recommended by neurology, completed by IR on 09/23/2018 right IJ route   2/15: emerging tone     -trial of tizanidine 1mg  tid    -ordered WHO LUE    2. DVT/anticoagulation/Hx of PE: Eliquis             -antiplatelet therapy: N/A 3. Pain Management: Lidoderm patch change as directed  -hydrocodone  d/t ams  -sports cream  -  low dose tramadol 25mg  q8 prn  -left RTC/OA shoulder: had left shoulder injection 2 mos ago apparently   -pain worsened by left HP, capsulitis   -consider another injection at some point when more medically stable   -continue shoulder ROM and support  -emerging tone LUE/LLE- ROM/orthotics for now   -WHO for LUE   -hesitate medicating d/t recent lethargy 4. Mood: Valium as needed for anxiety and depression             -antipsychotic agents: N/A  -expressing some anxiety/depression/distraction d/t husband's suicide. Has difficulty disengaging from thoughts at times.    -neuropsych input might be helpful--requested assessment   -on lexapro 10mg  daily currently 5. Neuropsych: This patient is capable of making decisions on her own behalf. 6. Skin/Wound Care: Routine skin checks 7. Fluids/Electrolytes/Nutrition: encourage PO  -2/12 k+ 3.7 8.  PFO.  Follow cardiology services.  TEE completed, suggesting PFO, await further Cards recs.. 9.  Hypertension.  Cardizem 120 mg daily, Lasix 20 mg daily, Cozaar 50 mg daily            2/15  allow permissive HTN for adequate cerebral perfusion. 10.  Hyperlipidemia: Continue Lipitor 11.  Diabetes mellitus with hyperglycemia.  Hemoglobin A1c 9.4.  SSI.  Patient on Glucophage 1000 mg twice daily prior to admission.   Resume as needed           CBG (last 3)  Recent Labs    09/26/19 1640 09/26/19 2059 09/27/19 0555  GLUCAP 163* 97 134*  Controlled 09/27/2019-no changes toda   13.  CKD stage II.                2/8 BUN/Cr 8/0.6 14.  Restless leg syndrome.  Requip 2 mg 3 times daily--Initially Reduced to HS d/t lethargy  2/11 home requip resumed d/t improved arousal.       LOS: 13 days A FACE TO [place] 09/27/2019, 9:52 AM

## 2019-09-27 NOTE — Progress Notes (Signed)
Physical Therapy Session Note  Patient Details  Name: [name] [name] MRN: [id number] Date of Birth: 1951-05-19  Today's Date: 09/27/2019 PT Individual Time: 0905-0950 AND 1132-1156 PT Individual Time Calculation (min): 45 min AND 24 min  Short Term Goals: Week 2:  PT Short Term Goal 1 (Week 2): Pt will perform bed mobility w/ max assist +1 PT Short Term Goal 2 (Week 2): Pt will perform sit<>stand w/ max assist +1 PT Short Term Goal 3 (Week 2): Pt will maintain static sitting balance w/ CGA PT Short Term Goal 4 (Week 2): Pt will initiate functional mobility w/ min cues 50% of the time  Skilled Therapeutic Interventions/Progress Updates:   Session 1:  Pt in supine and agreeable to therapy, no c/o pain. Supine>sit w/ total assist and max assist to maintain static sitting 2/2 posterior lean bias. Stedy transfer to TIS w/ max assist to stand and to maintain midline posture during transfer. Continues to need max tactile and verbal cues for step-by-step sequencing, initiation, and termination of functional tasks. Total assist w/c transport to/from therapy gym. Worked on sit<>stands in stedy w/ +2 assist so therapist is able to provide more skilled cues for midline and upright posture. Mirror for visual feedback of midline w/ max multimodal cues to achieve and maintain for 3-5 sec at a time. Unable to maintain static stance for more than 5-10 sec 2/2 fatigue and loss of postural control w/ fatigue. Performed 4-5 reps in total. Pt also needed mod cues to keep RLE on stedy. Transferred to edge of mat w/ stedy and worked on static sitting balance w/ mirror for visual feedback. Needed mod-max assist overall in 4-5 min bouts x2 reps. Max verbal cues to attend to task, manual and tactile cues for weight shifting at pelvis and trunk. Reclined rest break on wedge while therapist provided passive LUE stretching into elbow extension, unable to reach full extension but improved ability to tolerate stretching this  date. Stedy transfer back to w/c 2/2 pt's fatigue and worked on crossing midline on L side w/ reaching using RUE. Max verbal cues but able to perform 10+ reps w/o physical assist. Returned to room total assist and ended session in TIS, all needs in reach.   Session 2:  Pt received in TIS, agreeable to therapy and no c/o pain. Total assist w/c transport to/from therapy gym. Worked on reciprocal movement pattern and limb dissociation, initiation and attention to task, and L body/environmental awareness while performing kinetron. Mod verbal, tactile, and visual cues to attend to LLE and to task in general. Emphasis on achieving equal and reciprocal movements. Pt would not spontaneously attend to L side, but did demonstrate palpable LLE contraction w/ movements. Performed 60 sec x5 reps, needed min assist to keep reciprocal pattern going. Attempted to activate LLE only w/ therapist providing resistance on R side, pt unable to isolate any LLE movement, would only attempt w/ RLE despite max cues. Passive LLE stretching into knee extension and ankle DF, 60 sec hold x3 reps for LLE tone management. Mild discomfort noted at end range, but increased ability to tolerate end range hold than yesterday's session. Returned to room total assist and ended session in TIS, all needs in reach.   Session 3: Pt missed 30 min of skilled PT in PM 2/2 rapid response.   Therapy Documentation Precautions:  Precautions Precautions: Fall Precaution Comments: recent L humeral fx, no orders Restrictions Weight Bearing Restrictions: No General: PT Amount of Missed Time (min): 15 Minutes PT Missed  Treatment Reason: Patient fatigue   Therapy/Group: Individual Therapy  [name] [name] [name] [name] 09/27/2019, 10:03 AM

## 2019-09-27 NOTE — Patient Care Conference (Signed)
Inpatient RehabilitationTeam Conference and Plan of Care Update Date: 09/27/2019   Time: 10:00 AM   Patient Name: [name] [name]      Medical Record Number: [id number]  Date of Birth: 1951-02-09 Sex: Female         Room/Bed: 4W04C/4W04C-01 Payor Info: Payor: MEDICARE / Plan: MEDICARE PART A AND B / Product Type: *No Product type* /    Admit Date/Time:  09/14/2019  4:51 PM  Primary Diagnosis:  Acute bilat watershed infarction [facility])  Patient Active Problem List   Diagnosis Date Noted  . Acute bilat watershed infarction [facility]) 09/14/2019  . CKD (chronic kidney disease), stage II   . Uncontrolled type 2 diabetes mellitus with hyperglycemia ([place])   . PFO (patent foramen ovale)   . Acute CVA (cerebrovascular accident) ([place]) 09/11/2019  . Subarachnoid hemorrhage ([place]) 09/11/2019  . DVT (deep venous thrombosis) ([place]) 09/11/2019  . Aneurysm, thoracic aortic ([place])   . Embolic stroke ([place])   . Pulmonary embolism ([place])   . Acute cerebrovascular accident (CVA) ([place]) 08/18/2019  . COPD (chronic obstructive pulmonary disease) ([place])   . COPD exacerbation ([place]) 08/12/2018  . Hyponatremia 08/12/2018  . Acute respiratory failure with hypoxia ([place]) 10/26/2015  . CAP (community acquired pneumonia) 10/26/2015  . Type 2 diabetes mellitus ([place]) 10/26/2015  . Overactive bladder 09/10/2015  . Diabetes mellitus type 2, controlled, without complications ([place])   . Depression   . Insomnia   . GERD (gastroesophageal reflux disease)   . Hypercholesteremia   . Hypertension   . Chronic kidney disease   . Overweight 10/29/2009  . GERD 06/20/2008  . CHEST PAIN 06/20/2008  . CHOLECYSTECTOMY, HX OF 06/20/2008  . HYPERLIPIDEMIA 12/11/2007  . TOBACCO ABUSE 12/11/2007  . DEPRESSION 12/11/2007  . Essential hypertension 12/11/2007  . Coronary atherosclerosis 12/11/2007  . DUODENITIS, WITH HEMORRHAGE 12/11/2007    Expected Discharge Date: Expected Discharge Date: (May need SNF placement at [place])  Team Members  Present: Physician leading conference: Dr. Alger Simons Social Worker Present: Lennart Pall, LCSW(Auria Barbra Sarks, [place]) Nurse Present: Isla Pence, RN Case Manager: Karene Fry, RN PT Present: Burnard Bunting, PT OT Present: Laverle Hobby, OT SLP Present: Weston Anna, SLP PPS Coordinator present : Gunnar Fusi, SLP     Current Status/Progress Goal Weekly Team Focus  Bowel/Bladder   Pt incontinent of B&B. LBM 2/15  Pt will regain control of B/B with normal bowel pattern  timed toileting, assess needs prn/q shift   Swallow/Nutrition/ Hydration             ADL's   Mod A UB ADLS, max +2 LB ADLs, max +2 transfers. Poor L attention, L hemi with shoulder pain  Mod A overall  L NMR, motor planning, bed mobility, d/c planning, ADL retraining   Mobility   max-total assist bed mobility, +2 sit<>stands and transfers w/ stedy, fluctuating presentation  mod assist, w/c level, gait goals discontinued  motor planning, L attention/awareness, NMR and balance, postural control and midline orientation   Communication             Safety/Cognition/ Behavioral Observations  Max-Tota A  Min A  attention, initiation, orientation, awareness   Pain   pt denies pain  Pt pain will be below 3  assess pain q shift/prn   Skin   MASD to groin, small incision from IVC placement R IJ (open to air)  Pt will remain free of infection and further breakdown.  assess skin qshift/prn    Rehab Goals Patient on target to  meet rehab goals: No *See Care Plan and progress notes for long and short-term goals.     Barriers to Discharge  Current Status/Progress Possible Resolutions Date Resolved   Nursing                  PT                    OT Decreased caregiver support  Pt will require heavy assist and high burden of care upon d/c             SLP                SW                Discharge Planning/Teaching Needs:  Plan has been for pt to d/c home with son and his family able to provide 24/7 support.   Concern, however, over slow progress and some decline due to medical issues - discuss with son to determine if plan remains the same vs change to SNF.  son has observed   Team Discussion: Further infarcts last week, IVC filter placed, L hemi, more tone developing, meds added.  Splint L wrist and foot splint when resting.  RN inc B/B, MASD groin.  OT mod UB D, max/tot +2 LB, inc B/B, talked to son yesterday, goals mod A.  PT max/tot bed, +2 steady, max cues for rolling, mod A goals.  SLP max/tot cognition, can't initiate.  May need SNF at [place].   Revisions to Treatment Plan: N/A     Medical Summary Current Status: stroke extension. s/p IVCF. increased left hemiparesis with tone---tizanidine trial, splinting. bowel and bladder continence, chronic shoulder pain Weekly Focus/Goal: improve functional mobility and tone.  Barriers to Discharge: Medical stability       Continued Need for Acute Rehabilitation Level of Care: The patient requires daily medical management by a physician with specialized training in physical medicine and rehabilitation for the following reasons: Direction of a multidisciplinary physical rehabilitation program to maximize functional independence : Yes Medical management of patient stability for increased activity during participation in an intensive rehabilitation regime.: Yes Analysis of laboratory values and/or radiology reports with any subsequent need for medication adjustment and/or medical intervention. : Yes   I attest that I was present, lead the team conference, and concur with the assessment and plan of the team.   Jodell Cipro M 09/27/2019, 2:54 PM   Team conference was held via web/ teleconference due to [place] - 19

## 2019-09-27 NOTE — Consult Note (Signed)
Neurology Consultation  Reason for Consult: Seizure Referring Physician: Dr. Tessa Lerner  CC: Possible seizure  History is obtained from: Nurses and rapid response  HPI: [name] [name] is a 69 y.o. female who was recently admitted to inpatient rehab.  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  She was started on aspirin and Plavix.  Post discharge she was found to have a PE and age indeterminant right peroneal DVT and started on Xarelto.  She was also found to have a PFO.  Later patient was found to have developed recurrent bilateral embolic infarcts as well as right frontal lobe subarachnoid hemorrhage.  She was changed to Eliquis and DC to inpatient rehab.  Today neurology was called to bedside secondary to patient dropping blood pressure into the 0000000 systolically and having possible seizure.  Per staff patient was being transferred with a Harrel Lemon when suddenly she dropped her blood pressure systolically into the 0000000.  She was noted to become unresponsive with a left gaze deviation which had nystagmus.  Patient was placed in the bed in a supine position and slowly became more responsive and able to answer questions.  Given history of CVA and left gaze deviation 1 g Keppra was given to patient as a loading dose.  Stat EEG was obtained and patient was immediately brought down to CT to evaluate for intraparenchymal abnormalities.  Of note patient was also given Zanaflex approximately 1 hour prior to the event    Past Medical History:  Diagnosis Date  . Aneurysm, thoracic aortic ([place])   . Anxiety   . Arthritis   . CAD (coronary artery disease)   . Chronic kidney disease    Renal insufficiency  . COPD (chronic obstructive pulmonary disease) ([place])   . Depression   . Diabetes mellitus   . Diverticulosis of colon (without mention of hemorrhage)   . Duodenitis without mention of hemorrhage   . Embolic stroke ([place])   . GERD (gastroesophageal  reflux disease)   . Hemorrhoids   . Hypercholesteremia   . Hypertension   . Insomnia   . Pulmonary embolism ([place])   . Tinnitus   . Vertigo      Family History  Problem Relation Age of Onset  . Diabetes Mother   . Hemophilia Mother   . Breast cancer Sister   . Heart disease Father   . Colon polyps Father   . Diabetes Brother   . Colon polyps Sister     Social History:   reports that she quit smoking about 13 months ago. Her smoking use included cigarettes. She has never used smokeless tobacco. She reports that she does not drink alcohol or use drugs.  Medications  Current Facility-Administered Medications:  .  acetaminophen (TYLENOL) tablet 650 mg, 650 mg, Oral, Q4H PRN, 650 mg at 09/26/19 1141 **OR** acetaminophen (TYLENOL) 160 MG/5ML solution 650 mg, 650 mg, Per Tube, Q4H PRN **OR** acetaminophen (TYLENOL) suppository 650 mg, 650 mg, Rectal, Q4H PRN, Angiulli, Lavon Paganini, PA-C .  apixaban (ELIQUIS) tablet 5 mg, 5 mg, Oral, BID, Angiulli, Lavon Paganini, PA-C, 5 mg at 09/27/19 0813 .  atorvastatin (LIPITOR) tablet 20 mg, 20 mg, Oral, QHS, Angiulli, Lavon Paganini, PA-C, 20 mg at 09/26/19 2211 .  diazepam (VALIUM) tablet 2 mg, 2 mg, Oral, Daily PRN, Angiulli, Lavon Paganini, PA-C .  diltiazem (CARDIZEM CD) 24 hr capsule 120 mg, 120 mg, Oral, Daily, Angiulli, Lavon Paganini, PA-C, 120 mg at 09/27/19 0813 .  escitalopram (  LEXAPRO) tablet 10 mg, 10 mg, Oral, Daily, Angiulli, Lavon Paganini, PA-C, 10 mg at 09/27/19 [id number] .  furosemide (LASIX) tablet 20 mg, 20 mg, Oral, Daily, Angiulli, Lavon Paganini, PA-C, 20 mg at 09/27/19 0813 .  insulin aspart (novoLOG) injection 0-9 Units, 0-9 Units, Subcutaneous, TID WC, Angiulli, Lavon Paganini, PA-C, 2 Units at 09/27/19 1232 .  ipratropium-albuterol (DUONEB) 0.5-2.5 (3) MG/3ML nebulizer solution 3 mL, 3 mL, Nebulization, Q6H PRN, Angiulli, Lavon Paganini, PA-C .  levETIRAcetam (KEPPRA) IVPB 1000 mg/100 mL premix, 1,000 mg, Intravenous, Q12H, Lovorn, Megan, MD .  lidocaine (LIDODERM) 5 % 1  patch, 1 patch, Transdermal, Q24H, Angiulli, Lavon Paganini, PA-C, 1 patch at 09/27/19 1233 .  loperamide (IMODIUM) capsule 2 mg, 2 mg, Oral, PRN, Cathlyn Parsons, PA-C, 2 mg at 09/22/19 0039 .  LORazepam (ATIVAN) injection 1 mg, 1 mg, Intravenous, Once, Love, Pamela S, PA-C .  losartan (COZAAR) tablet 50 mg, 50 mg, Oral, Daily, Angiulli, Lavon Paganini, PA-C, 50 mg at 09/27/19 [id number] .  metFORMIN (GLUCOPHAGE) tablet 500 mg, 500 mg, Oral, BID WC, Lovorn, Megan, MD, 500 mg at 09/27/19 0813 .  Muscle Rub CREA, , Topical, BID, Meredith Staggers, MD, Given at 09/27/19 [phone] .  potassium chloride SA (KLOR-CON) CR tablet 20 mEq, 20 mEq, Oral, Daily, Meredith Staggers, MD, 20 mEq at 09/27/19 [phone] .  rOPINIRole (REQUIP) tablet 2 mg, 2 mg, Oral, TID, Angiulli, Lavon Paganini, PA-C, 2 mg at 09/27/19 1233 .  senna-docusate (Senokot-S) tablet 1 tablet, 1 tablet, Oral, QHS PRN, Angiulli, Lavon Paganini, PA-C .  simethicone (MYLICON) chewable tablet 80 mg, 80 mg, Oral, QID PRN, Cathlyn Parsons, PA-C, 80 mg at 09/22/19 1348 .  sodium chloride 0.9 % bolus 500 mL, 500 mL, Intravenous, Once, Love, Pamela S, PA-C .  sorbitol 70 % solution 30 mL, 30 mL, Oral, Daily PRN, Cathlyn Parsons, PA-C, 30 mL at 09/17/19 2035 .  traMADol (ULTRAM) tablet 25 mg, 25 mg, Oral, Q12H PRN, Meredith Staggers, MD, 25 mg at 09/25/19 0412  ROS:  Unable to obtain due to  mental status.   Exam: Current vital signs: BP (!) 120/33 (BP Location: Left Arm)   Pulse 67   Temp 98.2 F (36.8 C) (Oral)   Resp (!) 21   Ht 5\' 4"  (1.626 m)   Wt 83.6 kg   SpO2 97%   BMI 31.64 kg/m  Vital signs in last 24 hours: Temp:  [97.3 F (36.3 C)-98.2 F (36.8 C)] 98.2 F (36.8 C) (02/16 0324) Pulse Rate:  [52-103] 67 (02/16 1456) Resp:  [21] 21 (02/16 1340) BP: (85-173)/(33-155) 120/33 (02/16 1456) SpO2:  [83 %-97 %] 97 % (02/16 1456)   Constitutional: Appears well-developed and well-nourished.  Eyes: No scleral injection HENT: No OP obstrucion Head:  Normocephalic.  Cardiovascular: Palpable Respiratory: Effort normal, non-labored breathing GI: Soft.  No distension. There is no tenderness.  Skin: WDI  Neuro: Mental Status: Patient is awake, alert, oriented to person, place, month,  Speech-clear but minimal Cranial Nerves: II: Left hemianopsia III,IV, VI: EOMI without ptosis or diploplia. Pupils equal, round and reactive to light V: Facial sensation is symmetric to temperature VII: Mild left face asymmetry VIII: hearing is intact to voice XI: Shoulder shrug decreased on the left XII: tongue is midline without atrophy or fasciculations.  Motor: Flaccid on the left arm and leg Sensory: Response to noxious stimuli on the right and left Deep Tendon Reflexes: 2+ and symmetric in the biceps and patellae. Plantars: Toes  are downgoing bilaterally.   Labs I have reviewed labs in epic and the results pertinent to this consultation are:   CBC    Component Value Date/Time   WBC 10.1 09/19/2019 0508   RBC 4.72 09/19/2019 0508   HGB 13.5 09/19/2019 0508   HCT 41.3 09/19/2019 0508   PLT 373 09/19/2019 0508   MCV 87.5 09/19/2019 0508   MCH 28.6 09/19/2019 0508   MCHC 32.7 09/19/2019 0508   RDW 15.4 09/19/2019 0508   LYMPHSABS 3.0 09/19/2019 0508   MONOABS 0.9 09/19/2019 0508   EOSABS 0.2 09/19/2019 0508   BASOSABS 0.1 09/19/2019 0508    CMP     Component Value Date/Time   NA 131 (L) 09/23/2019 1637   K 3.7 09/23/2019 1637   CL 97 (L) 09/23/2019 1637   CO2 20 (L) 09/23/2019 1637   GLUCOSE 158 (H) 09/23/2019 1637   BUN 28 (H) 09/23/2019 1637   CREATININE 0.87 09/23/2019 1637   CREATININE 1.08 (H) 07/22/2019 1101   CALCIUM 9.4 09/23/2019 1637   PROT 6.4 (L) 09/15/2019 0537   ALBUMIN 3.2 (L) 09/15/2019 0537   AST 22 09/15/2019 0537   ALT 24 09/15/2019 0537   ALKPHOS 47 09/15/2019 0537   BILITOT 0.4 09/15/2019 0537   GFRNONAA >60 09/23/2019 1637   GFRNONAA 52 (L) 07/22/2019 1101   GFRAA >60 09/23/2019 1637   GFRAA  61 07/22/2019 1101    Lipid Panel     Component Value Date/Time   CHOL 136 08/19/2019 0500   TRIG 70 08/19/2019 0500   HDL 53 08/19/2019 0500   CHOLHDL 2.6 08/19/2019 0500   VLDL 14 08/19/2019 0500   LDLCALC 69 08/19/2019 0500   LDLCALC 98 04/26/2019 0932     Imaging I have reviewed the images obtained:  CT-scan of the brain-Subacute cerebral infarcts bilaterally, right greater than left are similar in size and distribution to prior studies. Questionable but not definite areas of mild hemorrhage in the right frontal parietal infarct. No midline shift.  Etta Quill PA-C [facility] [phone]  M-F  (9:00 am- 5:00 PM)  09/27/2019, 2:58 PM   I have seen the patient reviewed the above note.  I have reviewed the head CT, the area of mild hemorrhage is petechial at best, and I would not favor changing anticoagulant therapy at this time.  Assessment:  This is a 69 year old female who initially presented to the hospital secondary to bilateral watershed strokes.  She was transferred to [facility] for further rehab.  Patient has no history of seizures however today while therapy was working with patient she was noticed to have a left gaze deviation with left head turning with altered mental status in the setting of a drop in blood pressure with systolic reading of 0000000.  Given patient's left gaze deviation and head turning differential includes syncopal episode versus seizure activity.  At this point etiology is unclear however CT of head was within normal limits and EEG has been ordered.  During consultation patient was slowly returning to baseline and able to follow commands and answer questions.  Even though the episode today was focal, with systolics in the 0000000 even if it was a seizure it was provoked.  Of more concern are the episodes of transient altered mental status that have been happening over the past few weeks.  I do think an EEG would be prudent.  If this is negative, then  we could consider therapeutic trial of Keppra to see if these episodes stop,  but I think it is also possible that they represent presyncope.  Impression:  -Seizure versus syncopal episode   Recommendations: -EEG -Loaded with Keppra 1 g-this has been done -Continue Keppra 500 twice daily for the time being -At this point seizure precautions -Agree with holding Zanaflex as this can lower seizure threshold   Roland Rack, MD [facility] [phone]  If 7pm- 7am, please page neurology on call as listed in [place].

## 2019-09-28 ENCOUNTER — Inpatient Hospital Stay (HOSPITAL_COMMUNITY): Payer: Medicare Other | Admitting: Occupational Therapy

## 2019-09-28 ENCOUNTER — Inpatient Hospital Stay (HOSPITAL_COMMUNITY): Payer: Medicare Other | Admitting: Physical Therapy

## 2019-09-28 ENCOUNTER — Inpatient Hospital Stay (HOSPITAL_COMMUNITY): Payer: Medicare Other | Admitting: Speech Pathology

## 2019-09-28 DIAGNOSIS — R9401 Abnormal electroencephalogram [EEG]: Secondary | ICD-10-CM

## 2019-09-28 LAB — GLUCOSE, CAPILLARY
Glucose-Capillary: 109 mg/dL — ABNORMAL HIGH (ref 70–99)
Glucose-Capillary: 181 mg/dL — ABNORMAL HIGH (ref 70–99)
Glucose-Capillary: 115 mg/dL — ABNORMAL HIGH (ref 70–99)

## 2019-09-28 LAB — CBC WITH DIFFERENTIAL/PLATELET
Abs Immature Granulocytes: 0.09 10*3/uL — ABNORMAL HIGH (ref 0.00–0.07)
Basophils Absolute: 0.1 10*3/uL (ref 0.0–0.1)
Basophils Relative: 1 %
Eosinophils Absolute: 0.1 10*3/uL (ref 0.0–0.5)
Eosinophils Relative: 1 %
HCT: 41.2 % (ref 36.0–46.0)
Hemoglobin: 13.6 g/dL (ref 12.0–15.0)
Immature Granulocytes: 1 %
Lymphocytes Relative: 28 %
Lymphs Abs: 2.8 10*3/uL (ref 0.7–4.0)
MCH: 28.8 pg (ref 26.0–34.0)
MCHC: 33 g/dL (ref 30.0–36.0)
MCV: 87.3 fL (ref 80.0–100.0)
Monocytes Absolute: 0.7 10*3/uL (ref 0.1–1.0)
Monocytes Relative: 7 %
Neutro Abs: 6.3 10*3/uL (ref 1.7–7.7)
Neutrophils Relative %: 62 %
Platelets: 413 10*3/uL — ABNORMAL HIGH (ref 150–400)
RBC: 4.72 MIL/uL (ref 3.87–5.11)
RDW: 15.2 % (ref 11.5–15.5)
WBC: 10 10*3/uL (ref 4.0–10.5)
nRBC: 0 % (ref 0.0–0.2)

## 2019-09-28 NOTE — Progress Notes (Signed)
[facility] PHYSICAL MEDICINE & REHABILITATION PROGRESS NOTE   Subjective/Complaints: Appears very sleepy this morning. Closing her eyes while nurse is giving her medications. Denies pain, constipation. Says she slept well last night.   ROS: Patient denies fever, rash, sore throat, blurred vision, nausea, vomiting, diarrhea, cough, shortness of breath or chest pain, joint or back pain, headache, or mood change.    Objective:   CT HEAD WO CONTRAST  Result Date: 09/27/2019 CLINICAL DATA:  Stroke.  Seizure. EXAM: CT HEAD WITHOUT CONTRAST TECHNIQUE: Contiguous axial images were obtained from the base of the skull through the vertex without intravenous contrast. COMPARISON:  CT head 09/21/2019.  MRI 09/22/2019 FINDINGS: Brain: Recent infarcts in both cerebral hemispheres similar in distribution and size to the prior studies. Low-density areas of subacute infarct in the right frontal parietal lobe appear unchanged. Possible but not definite small areas of hemorrhage in the infarcts on the right. Recent infarct in the left frontal lobe anteriorly is unchanged without evidence of hemorrhage. Left parietal infarct is smaller and unchanged. Ventricle size normal.  No midline shift. Vascular: Negative for hyperdense vessel Skull: Negative Sinuses/Orbits: Negative Other: None IMPRESSION: Subacute cerebral infarcts bilaterally, right greater than left are similar in size and distribution to prior studies. Questionable but not definite areas of mild hemorrhage in the right frontal parietal infarct. No midline shift. Electronically Signed   By: Franchot Gallo M.D.   On: 09/27/2019 14:47   No results for input(s): WBC, HGB, HCT, PLT in the last 72 hours. No results for input(s): NA, K, CL, CO2, GLUCOSE, BUN, CREATININE, CALCIUM in the last 72 hours.  Intake/Output Summary (Last 24 hours) at 09/28/2019 [id number] Last data filed at 09/28/2019 0300 Gross per 24 hour  Intake 872.41 ml  Output 500 ml  Net 372.41 ml      Physical Exam: Vital Signs Blood pressure (!) 151/69, pulse 79, temperature 97.9 F (36.6 C), resp. rate 18, height 5\' 4"  (1.626 m), weight 83.6 kg, SpO2 97 %. Constitutional: No distress . Vital signs reviewed. Very sleepy this morning.  HEENT: EOMI, oral membranes moist Neck: supple Cardiovascular: RRR without murmur. No JVD    Respiratory: CTA Bilaterally without wheezes or rales. Normal effort    GI: BS +, non-tender, non-distended  Musculoskeletal:   No pain with upper extremity range of motion Neurological: oriented to place, reason she's here. Remembered why ivcf was placed.  Left inattention is more severe  Motor: RUE/RLE: 4/5 proximal distal unchanged LUE: 0/5 with increased flexor tone LLE: 0/5 increased extensor tone     Skin: Skin is warm and dry.  Psychiatric:flat  Assessment/Plan: 1. Functional deficits secondary to bicortical infarcts, Right frontal SAH which require 3+ hours per day of interdisciplinary therapy in a comprehensive inpatient rehab setting.  Physiatrist is providing close team supervision and 24 hour management of active medical problems listed below.  Physiatrist and rehab team continue to assess barriers to discharge/monitor patient progress toward functional and medical goals  Care Tool:  Bathing  Bathing activity did not occur: Refused           Bathing assist       Upper Body Dressing/Undressing Upper body dressing   What is the patient wearing?: Hospital gown only    Upper body assist Assist Level: Maximal Assistance - Patient 25 - 49%    Lower Body Dressing/Undressing Lower body dressing      What is the patient wearing?: Pants, Incontinence brief     Lower body assist  Assist for lower body dressing: Total Assistance - Patient < 25%     Toileting Toileting    Toileting assist Assist for toileting: Dependent - Patient 0%     Transfers Chair/bed transfer  Transfers assist     Chair/bed transfer assist level:  Dependent - mechanical lift     Locomotion Ambulation   Ambulation assist   Ambulation activity did not occur: Safety/medical concerns          Walk 10 feet activity   Assist  Walk 10 feet activity did not occur: Safety/medical concerns        Walk 50 feet activity   Assist Walk 50 feet with 2 turns activity did not occur: Safety/medical concerns         Walk 150 feet activity   Assist Walk 150 feet activity did not occur: Safety/medical concerns         Walk 10 feet on uneven surface  activity   Assist Walk 10 feet on uneven surfaces activity did not occur: Safety/medical concerns         Wheelchair     Assist Will patient use wheelchair at discharge?: No(LT goals not set )             Wheelchair 50 feet with 2 turns activity    Assist            Wheelchair 150 feet activity     Assist          Blood pressure (!) 151/69, pulse 79, temperature 97.9 F (36.6 C), resp. rate 18, height 5\' 4"  (1.626 m), weight 83.6 kg, SpO2 97 %.  Medical Problem List and Plan: 1.  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 Patient with bilateral cerebral and right occipital infarcts last week as well as pulmonary emboli with DVT.             No neuro changes since yesterday   -MRI yesterday revealed progression of bi-frontal infarcts as well as new mall infarcts right lateral occ lobe   -?etiology, CT abdomen without malignancy   -?venous source given PFO given lack of other obvious etiologies. IVCF placed as recommended by neurology, completed by IR on 09/23/2018 right IJ route   2/15: emerging tone     -trial of tizanidine 1mg  tid    -ordered WHO LUE 2. DVT/anticoagulation/Hx of PE: Eliquis             -antiplatelet therapy: N/A 3. Pain Management: Lidoderm patch change as directed  -hydrocodone  d/t ams  -sports cream  -  low dose tramadol 25mg  q8  prn  -left RTC/OA shoulder: had left shoulder injection 2 mos ago apparently   -pain worsened by left HP, capsulitis   -consider another injection at some point when more medically stable   -continue shoulder ROM and support  -emerging tone LUE/LLE- ROM/orthotics for now   -WHO for LUE   -hesitate medicating d/t recent lethargy  2/17: pain is well controlled.  4. Mood: Valium as needed for anxiety and depression             -antipsychotic agents: N/A  -expressing some anxiety/depression/distraction d/t husband's suicide. Has difficulty disengaging from thoughts at times.    -neuropsych input might be helpful--requested assessment   -on lexapro 10mg  daily currently 5. Neuropsych: This patient is capable of making decisions on her own behalf. 6. Skin/Wound Care: Routine skin checks 7. Fluids/Electrolytes/Nutrition: encourage PO  -2/12 k+  3.7 8.  PFO.  Follow cardiology services.  TEE completed, suggesting PFO, await further Cards recs.. 9.  Hypertension.  Cardizem 120 mg daily, Lasix 20 mg daily, Cozaar 50 mg daily            2/15  allow permissive HTN for adequate cerebral perfusion. 10.  Hyperlipidemia: Continue Lipitor 11.  Diabetes mellitus with hyperglycemia.  Hemoglobin A1c 9.4.  SSI.  Patient on Glucophage 1000 mg twice daily prior to admission.  Resume as needed           CBG (last 3)  Recent Labs    09/27/19 1637 09/27/19 2112 09/28/19 0612  GLUCAP 142* 100* 109*  Controlled 09/28/2019-no changes today 13.  CKD stage II.                2/8 BUN/Cr 8/0.6 14.  Restless leg syndrome.  Requip 2 mg 3 times daily--Initially Reduced to HS d/t lethargy  2/11 home requip resumed d/t improved arousal.  15. Unresponsiveness:  Had unresponsive episode yesterday with left gaze preference after receiving Tizanidine. Was evaluated by Dr. Dagoberto Ligas and Reesa Chew and the time and given IM Ativan, IV Keppra, NaCl bolus with rapid response activated. Lips turned blue and NRBM was placed.  Neurology was consulted regarding status epilepticus; episode lasted 15 minutes. Stat CT and EEG were ordered; EEG results are pending.  I have personally reviewed Head CT which shows bilateral subacute cerebral infarcts that are stable from prior with questionable areas of mild hemorrhage in right frontal parietal infarct with no midline shift. Patient is easily arousable and communicative this morning but very sleepy. Tizanidine has been discontinued and will follow up EEG results today. Son was updated yesterday.       LOS: 14 days A FACE TO FACE EVALUATION WAS PERFORMED  Tekeisha Hakim P Fayelynn Distel 09/28/2019, 9:22 AM

## 2019-09-28 NOTE — Progress Notes (Signed)
Speech Language Pathology Weekly Progress and Session Note  Patient Details  Name: [name] [name] MRN: [id number] Date of Birth: Jan 11, 1951  Beginning of progress report period: September 22, 2019 End of progress report period: September 28, 2019  Today's Date: 09/28/2019 SLP Individual Time: 9678-9381 SLP Individual Time Calculation (min): 55 min  Short Term Goals: Week 2: SLP Short Term Goal 1 (Week 2): Pt will sustain attention to tasks for 5 minute intervals with Mod A cues for redirection. SLP Short Term Goal 1 - Progress (Week 2): Not met SLP Short Term Goal 2 (Week 2): Pt will recall new and/or complex daily information with Mod A verbal/visual cues for use of compensatory strategies. SLP Short Term Goal 2 - Progress (Week 2): Not met SLP Short Term Goal 3 (Week 2): Pt will demonstrate problem solving during basic tasks with Mod A verbal/visual cues. SLP Short Term Goal 3 - Progress (Week 2): Not met SLP Short Term Goal 4 (Week 2): Pt will demonstrate emergent awareness by detecting errors during functional tasks with Max A verbal/visual cues. SLP Short Term Goal 4 - Progress (Week 2): Not met SLP Short Term Goal 5 (Week 2): Patient will scan to her left field of envionrment during functional tasks with Max A multimodal cues. SLP Short Term Goal 5 - Progress (Week 2): Met    New Short Term Goals: Week 3: SLP Short Term Goal 1 (Week 3): Pt will sustain attention to tasks for 5 minute intervals with Mod A cues for redirection. SLP Short Term Goal 2 (Week 3): Pt will recall new and/or complex daily information with Max A verbal/visual cues for use of compensatory strategies. SLP Short Term Goal 3 (Week 3): Pt will demonstrate problem solving during basic tasks with Max A verbal/visual cues. SLP Short Term Goal 4 (Week 3): Patient will scan to her left field of envionrment during functional tasks with Mod A multimodal cues.  Weekly Progress Updates: Patient continues to make minimal  gains and has met 1 of 5 STGs this reporting period. Currently, patient continues to require overall Max-Total A multimodal cues to complete basic and functional tasks in regard to sustained attention, problem solving, recall, scanning to left field of environment and awareness. Patient's function continues to be impacted by ongoing medical issues. Patient and family education ongoing. Patient would benefit from continued skilled SLP intervention to maximize her cognitive functioning and overall functional independence prior to discharge.     Intensity: Minumum of 1-2 x/day, 30 to 90 minutes Frequency: 3 to 5 out of 7 days Duration/Length of Stay: TBD due to possible SNF placement Treatment/Interventions: Cognitive remediation/compensation;Cueing hierarchy;Functional tasks;Internal/external aids;Patient/family education;Therapeutic Activities;Environmental controls   Daily Session  Skilled Therapeutic Interventions: Skilled treatment session focused on cognitive goals. Upon arrival, patient appeared lethargic while upright in the wheelchair. SLP facilitated session by providing Mod A verbal cues for patient to scan to left field of environment to name and locate 4 functional items and Max-Total A to identify certain items within a specific category or functional items needed within a functional situation. Patient transferred back to bed at end of session via the [facility] with total +2 assist. Patient was incontinent of urine and required total A for pericare. Patient left supine in bed with alarm on and all needs within reach. Continue with current plan of care.     Pain No/Denies Pain   Therapy/Group: Individual Therapy  Enijah Furr 09/28/2019, 6:51 AM

## 2019-09-28 NOTE — Plan of Care (Signed)
Pt therapy frequency downgraded to QD 2/2 lack of progress towards LTGs and decreased tolerance to OOB therapeutic activity.   Burnard Bunting, PT, DPT

## 2019-09-28 NOTE — Progress Notes (Signed)
Physical Therapy Session Note  Patient Details  Name: [name] [name] MRN: [id number] Date of Birth: November 26, 1950  Today's Date: 09/28/2019 PT Individual Time: 1415-1510 PT Individual Time Calculation (min): 55 min   Short Term Goals: Week 2:  PT Short Term Goal 1 (Week 2): Pt will perform bed mobility w/ max assist +1 PT Short Term Goal 2 (Week 2): Pt will perform sit<>stand w/ max assist +1 PT Short Term Goal 3 (Week 2): Pt will maintain static sitting balance w/ CGA PT Short Term Goal 4 (Week 2): Pt will initiate functional mobility w/ min cues 50% of the time  Skilled Therapeutic Interventions/Progress Updates:   Pt in supine and agreeable to therapy, denies pain. Pt w/ vital signs not WNL and NT rechecking manually. Vitals ultimately WNL, pt appearing well and RN agreeable for session to proceed. Dependent supine>sit. Pt able to respond "yes" when asked to initiate movement to EOB, however ultimately needing total assist to transition to EOB and mod-max assist to maintain static sitting balance at first, fading to min-mod assist after 2-3 min. Verbal, tactile, and manual cues for anterior weight shifting, BLE placement on floor, and for midline orientation. 2nd helper donned socks and pants, sit<>stand vis 3-musketeers at EOB w/ total assist to pull pants over hips in standing. Pt unable to reach full standing despite max multimodal cues. Unable to find bariatric stedy to transfer pt, performed dependent slide board transfer to [place] for time management. Pt would initiate scooting back in chair, however needed max-total assist w/ verbal and manual cues for head/hips relationship. Total assist w/c transport to/from therapy gym. Worked on sit<>stands at rail, performed x2 reps w/ total assist to boost, not fully upright however. Max verbal, tactile, manual cues for technique, BLE placement, to block L knee in standing, and for midline. Pt w/ mirror in front of her, unable to attend to visual image of  herself in mirror to correct posture. Made 3rd attempt when pt's R foot began to slide under w/c, pt slowly sliding off w/c and needed +3 assist to safely sit back in TIS. Pt unsure why that happened and appeared to have very little awareness that she almost fell. Tilted back in TIS for safety and pt appreciative, said she was feeling tired. Transitioned to seated activity 2/2 fatigue. Worked on pt using RUE to cross midline, grab horseshoe, and toss w/ some effort. Pt needed min verbal cues at first, faded to max step-by-step verbal and tactile cues after 8-10 reps. Pt also noted to have both eyelids closing, difficulty w/ keeping open. Pt stated "I just don't feel good" but denied dizziness, lightheadedness, or pain. Returned to room and tilted back in TIS, pt reported feeling better overall. Ended session in TIS, all needs in reach. RN made aware of status. Missed 20 min of skilled PT 2/2 fatigue.   Therapy Documentation Precautions:  Precautions Precautions: Fall Precaution Comments: recent L humeral fx, no orders Restrictions Weight Bearing Restrictions: No Vital Signs: Therapy Vitals Temp: (!) 97.5 F (36.4 C) Temp Source: Oral Pulse Rate: 75 Resp: 18 BP: (!) 150/48 Oxygen Therapy SpO2: 97 % O2 Device: Room Air  Therapy/Group: Individual Therapy  Lannis Lichtenwalner Clent Demark 09/28/2019, 5:13 PM

## 2019-09-28 NOTE — Progress Notes (Signed)
Occupational Therapy Session Note  Patient Details  Name: [name] [name] MRN: [id number] Date of Birth: 1951/04/13  Today's Date: 09/28/2019 OT Individual Time: 0900-1000 OT Individual Time Calculation (min): 60 min    Short Term Goals: Week 2:  OT Short Term Goal 1 (Week 2): Pt will complete 1 step of UB dressing to demo improved apraxia OT Short Term Goal 2 (Week 2): Pt will sit EOB 5 min wiht MIN A for sitting balnace OT Short Term Goal 3 (Week 2): Pt will locate items on sink/tray on L with no more than 1 VC OT Short Term Goal 4 (Week 2): Pt will sit to stand in stedy wiht MAX A of 1 to decrease BOC Week 3:    Week 4:     Skilled Therapeutic Interventions/Progress Updates:    1:1 Pt in bed alert and awake. Pt appears to be similar to her baseline this morning after yesterday's events. Pt came to EOB with total A (to roll to the right) and total to come up into sitting from sidelying. Pt required min to total A to maintain sitting balance (static balance) at EOB. Pt does present with pushing behaviors towards the left side. Focus on visual attention to left with max multimodal cues. Pt participated in dressing at EOB. Pt required hand over hand to initiate threading left UE and following through to don shirt; overall requiring total A. Pt with improved sitting balance with holding onto arm rest on chair in front of her (with therapist sitting in the chair) to maintain a more forward posture. Pt able to tolerate crossing left LE over right but again required total  A to threading pants bilaterally. Total A to don shoes. In order to engage in self care tasks at [facility]; a second person was required to manage sitting balance. Performed sit to stand three musketeers with mod A +2.   Performed slide board transfer with +2 with goal for pt to initiate movement with cue of 1,2,3/ Pt able to maintain forward posture with mod A and +2 for scooting along board in to tilt in space w/c. Engaged in eating  breakfast. Noted pt with difficulty terminating tasks of eating still scrapping an empty portion of plate and difficulty restarting eating after wiping mouth or taking a drink.   Left resting in the tilt in space chair reclined.  Therapy Documentation Precautions:  Precautions Precautions: Fall Precaution Comments: recent L humeral fx, no orders Restrictions Weight Bearing Restrictions: No Pain:  ongoing c/o pain in left shoulder with movement- pt with tone in UE and shoulder. [place]: 3 in shoulder, elbow, hand  Therapy/Group: Individual Therapy  Willeen Cass [facility] 09/28/2019, 10:26 AM

## 2019-09-28 NOTE — Procedures (Signed)
Patient Name: [name] [name]  MRN: [id number]  Epilepsy Attending: Lora Havens  Referring Physician/Provider: Olin Hauser love, PA Date: 09/27/2019 Duration: 22.38 minutes  Patient history: 69 year old female with subacute cerebral infarcts bilaterally, right greater than left who was noted to have an episode of unresponsiveness with left gaze preference.  EEG to evaluate for seizures.  Level of alertness: Awake  AEDs during EEG study: Keppra  Technical aspects: This EEG study was done with scalp electrodes positioned according to the 10-20 International system of electrode placement. Electrical activity was acquired at a sampling rate of 500Hz  and reviewed with a high frequency filter of 70Hz  and a low frequency filter of 1Hz . EEG data were recorded continuously and digitally stored.   Description: No clear posterior dominant was seen.  EEG showed continuous generalized rhythmic 2 to 3 Hz delta slowing.  Hyperventilation photic summation were not performed.  Abnormalities -Continuous slow, generalized   IMPRESSION: This study is suggestive of moderate to severe diffuse encephalopathy, nonspecific to etiology but could be secondary to underlying strokes. No seizures or epileptiform discharges were seen throughout the recording.    [name] [name]

## 2019-09-29 ENCOUNTER — Inpatient Hospital Stay (HOSPITAL_COMMUNITY): Payer: Medicare Other | Admitting: Speech Pathology

## 2019-09-29 ENCOUNTER — Encounter: Payer: Self-pay | Admitting: Hematology

## 2019-09-29 ENCOUNTER — Inpatient Hospital Stay (HOSPITAL_COMMUNITY): Payer: Medicare Other | Admitting: Physical Therapy

## 2019-09-29 ENCOUNTER — Telehealth: Payer: Self-pay | Admitting: Hematology

## 2019-09-29 DIAGNOSIS — R569 Unspecified convulsions: Secondary | ICD-10-CM

## 2019-09-29 DIAGNOSIS — G8111 Spastic hemiplegia affecting right dominant side: Secondary | ICD-10-CM

## 2019-09-29 LAB — BASIC METABOLIC PANEL
Anion gap: 11 (ref 5–15)
BUN: 7 mg/dL — ABNORMAL LOW (ref 8–23)
CO2: 23 mmol/L (ref 22–32)
Calcium: 9.1 mg/dL (ref 8.9–10.3)
Chloride: 100 mmol/L (ref 98–111)
Creatinine, Ser: 0.6 mg/dL (ref 0.44–1.00)
GFR calc Af Amer: >60 mL/min (ref >60)
GFR calc non Af Amer: >60 mL/min (ref >60)
Glucose, Bld: 133 mg/dL — ABNORMAL HIGH (ref 70–99)
Potassium: 3.4 mmol/L — ABNORMAL LOW (ref 3.5–5.1)
Sodium: 134 mmol/L — ABNORMAL LOW (ref 135–145)

## 2019-09-29 LAB — GLUCOSE, CAPILLARY
Glucose-Capillary: 96 mg/dL (ref 70–99)
Glucose-Capillary: 117 mg/dL — ABNORMAL HIGH (ref 70–99)
Glucose-Capillary: 115 mg/dL — ABNORMAL HIGH (ref 70–99)
Glucose-Capillary: 179 mg/dL — ABNORMAL HIGH (ref 70–99)
Glucose-Capillary: 87 mg/dL (ref 70–99)

## 2019-09-29 NOTE — Telephone Encounter (Signed)
Received a new hem referral from Dr. Dennard Schaumann for DVT. Pt has been scheduled to seee Dr. Irene Limbo on 3/25 at 1pm. Pt is currently in the hospital. Will mail the pt a letter.

## 2019-09-29 NOTE — Progress Notes (Addendum)
PHYSICAL MEDICINE & REHABILITATION PROGRESS NOTE   Subjective/Complaints: Pt alert, up in w/c. Holding onto sink. Denies pain. Doesn't recall events of yesterday  ROS: Limited due to cognitive/behavioral   Objective:   CT HEAD WO CONTRAST  Result Date: 09/27/2019 CLINICAL DATA:  Stroke.  Seizure. EXAM: CT HEAD WITHOUT CONTRAST TECHNIQUE: Contiguous axial images were obtained from the base of the skull through the vertex without intravenous contrast. COMPARISON:  CT head 09/21/2019.  MRI 09/22/2019 FINDINGS: Brain: Recent infarcts in both cerebral hemispheres similar in distribution and size to the prior studies. Low-density areas of subacute infarct in the right frontal parietal lobe appear unchanged. Possible but not definite small areas of hemorrhage in the infarcts on the right. Recent infarct in the left frontal lobe anteriorly is unchanged without evidence of hemorrhage. Left parietal infarct is smaller and unchanged. Ventricle size normal.  No midline shift. Vascular: Negative for hyperdense vessel Skull: Negative Sinuses/Orbits: Negative Other: None IMPRESSION: Subacute cerebral infarcts bilaterally, right greater than left are similar in size and distribution to prior studies. Questionable but not definite areas of mild hemorrhage in the right frontal parietal infarct. No midline shift. Electronically Signed   By: Franchot Gallo M.D.   On: 09/27/2019 14:47   EEG adult  Result Date: 09/28/2019 Lora Havens, MD     09/28/2019  4:02 PM Patient Name: [name] [name] MRN: [id number] Epilepsy Attending: Lora Havens Referring Physician/Provider: Olin Hauser love, PA Date: 09/27/2019 Duration: 22.38 minutes Patient history: 69 year old female with subacute cerebral infarcts bilaterally, right greater than left who was noted to have an episode of unresponsiveness with left gaze preference.  EEG to evaluate for seizures. Level of alertness: Awake AEDs during EEG study: Keppra Technical  aspects: This EEG study was done with scalp electrodes positioned according to the 10-20 International system of electrode placement. Electrical activity was acquired at a sampling rate of 500Hz  and reviewed with a high frequency filter of 70Hz  and a low frequency filter of 1Hz . EEG data were recorded continuously and digitally stored. Description: No clear posterior dominant was seen.  EEG showed continuous generalized rhythmic 2 to 3 Hz delta slowing.  Hyperventilation photic summation were not performed. Abnormalities -Continuous slow, generalized IMPRESSION: This study is suggestive of moderate to severe diffuse encephalopathy, nonspecific to etiology but could be secondary to underlying strokes. No seizures or epileptiform discharges were seen throughout the recording. Lora Havens   Recent Labs    09/28/19 1227  WBC 10.0  HGB 13.6  HCT 41.2  PLT 413*   No results for input(s): NA, K, CL, CO2, GLUCOSE, BUN, CREATININE, CALCIUM in the last 72 hours.  Intake/Output Summary (Last 24 hours) at 09/29/2019 0953 Last data filed at 09/28/2019 1842 Gross per 24 hour  Intake 120 ml  Output --  Net 120 ml     Physical Exam: Vital Signs Blood pressure (!) 148/54, pulse 90, temperature 98.2 F (36.8 C), temperature source Oral, resp. rate 18, height 5\' 4"  (1.626 m), weight 83.6 kg, SpO2 98 %. Constitutional: No distress . Vital signs reviewed. HEENT: EOMI, oral membranes moist Neck: supple Cardiovascular: RRR without murmur. No JVD    Respiratory: CTA Bilaterally without wheezes or rales. Normal effort    GI: BS +, non-tender, non-distended  Musculoskeletal:   No pain with upper extremity range of motion Neurological: oriented to place, reason she's here. Knows she has therapy today Motor: RUE/RLE: 4/5 proximal distal unchanged LUE: 0/5 with ongoing flexor tone LLE: 0/5  continued extensor tone  Skin: Skin is warm and dry.  Psychiatric:flat  Assessment/Plan: 1. Functional deficits  secondary to bicortical infarcts, Right frontal SAH which require 3+ hours per day of interdisciplinary therapy in a comprehensive inpatient rehab setting.  Physiatrist is providing close team supervision and 24 hour management of active medical problems listed below.  Physiatrist and rehab team continue to assess barriers to discharge/monitor patient progress toward functional and medical goals  Care Tool:  Bathing  Bathing activity did not occur: Refused           Bathing assist       Upper Body Dressing/Undressing Upper body dressing   What is the patient wearing?: Pull over shirt    Upper body assist Assist Level: Maximal Assistance - Patient 25 - 49%    Lower Body Dressing/Undressing Lower body dressing      What is the patient wearing?: Pants     Lower body assist Assist for lower body dressing: Total Assistance - Patient < 25%     Toileting Toileting    Toileting assist Assist for toileting: Dependent - Patient 0%     Transfers Chair/bed transfer  Transfers assist     Chair/bed transfer assist level: 2 Helpers     Locomotion Ambulation   Ambulation assist   Ambulation activity did not occur: Safety/medical concerns          Walk 10 feet activity   Assist  Walk 10 feet activity did not occur: Safety/medical concerns        Walk 50 feet activity   Assist Walk 50 feet with 2 turns activity did not occur: Safety/medical concerns         Walk 150 feet activity   Assist Walk 150 feet activity did not occur: Safety/medical concerns         Walk 10 feet on uneven surface  activity   Assist Walk 10 feet on uneven surfaces activity did not occur: Safety/medical concerns         Wheelchair     Assist Will patient use wheelchair at discharge?: No(LT goals not set )             Wheelchair 50 feet with 2 turns activity    Assist            Wheelchair 150 feet activity     Assist           Blood pressure (!) 148/54, pulse 90, temperature 98.2 F (36.8 C), temperature source Oral, resp. rate 18, height 5\' 4"  (1.626 m), weight 83.6 kg, SpO2 98 %.  Medical Problem List and Plan: 1.  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 Patient with bilateral cerebral and right occipital infarcts last week as well as pulmonary emboli with DVT.             No neuro changes since yesterday   -MRI yesterday revealed progression of bi-frontal infarcts as well as new mall infarcts right lateral occ lobe   -?etiology, CT abdomen without malignancy   -?venous source given PFO given lack of other obvious etiologies. IVCF placed as recommended by neurology, completed by IR on 09/23/2018 right IJ route   -emerging tone: unable to tolerate tizanidine.     -will stay with splinting/ROM for now    -botox as outpt 2. DVT/anticoagulation/Hx of PE: Eliquis             -antiplatelet therapy: N/A 3. Pain Management:  Lidoderm patch change as directed  -hydrocodone  d/t ams  -sports cream  -  low dose tramadol 25mg  q8 prn  -left RTC/OA shoulder: had left shoulder injection 2 mos ago apparently   -pain worsened by left HP, capsulitis   -consider another injection at some point when more medically stable   -continue shoulder ROM and support  -emerging tone LUE/LLE- ROM/orthotics for now   -WHO for LUE   -hesitate medicating d/t recent lethargy  2/17: pain is well controlled.  4. Mood: Valium as needed for anxiety and depression             -antipsychotic agents: N/A  -expressing some anxiety/depression/distraction d/t husband's suicide. Has difficulty disengaging from thoughts at times.    -neuropsych input might be helpful--requested assessment   -on lexapro 10mg  daily currently 5. Neuropsych: This patient is capable of making decisions on her own behalf. 6. Skin/Wound Care: Routine skin checks 7.  Fluids/Electrolytes/Nutrition: encourage PO  -2/12 k+ 3.7 8.  PFO.  Follow cardiology services.  TEE completed, suggesting PFO, await further Cards recs.. 9.  Hypertension.  Cardizem 120 mg daily, Lasix 20 mg daily, Cozaar 50 mg daily            2/15  allow permissive HTN for adequate cerebral perfusion. 10.  Hyperlipidemia: Continue Lipitor 11.  Diabetes mellitus with hyperglycemia.  Hemoglobin A1c 9.4.  SSI.  Patient on Glucophage 1000 mg twice daily prior to admission.  Resume as needed           CBG (last 3)  Recent Labs    09/28/19 1641 09/28/19 2119 09/29/19 0558  GLUCAP 115* 96 117*  Controlled 09/29/2019-no changes today 13.  CKD stage II.                2/8 BUN/Cr 8/0.6 14.  Restless leg syndrome.  Requip 2 mg 3 times daily--Initially Reduced to HS d/t lethargy  2/11 home requip resumed d/t improved arousal.  15. Unresponsiveness: ?seizure  -keppra initiated after episode on 2/16  -EEG demonstrated generalized slowing, CT without changes  2/18: continue keppra for now.   -appreciate neuro help         -labs pending for today    LOS: 15 days A FACE TO [place] 09/29/2019, 9:53 AM

## 2019-09-29 NOTE — Progress Notes (Signed)
Subjective: No further seizures.   Exam: Vitals:   09/29/19 0353 09/29/19 0835  BP:  (!) 148/54  Pulse: 90   Resp:    Temp: 98.2 F (36.8 C)   SpO2: 98%    Gen: In bed, NAD Resp: non-labored breathing, no acute distress Abd: soft, nt  Neuro: MS: Awake, alert, interactive and appropriate CN: Pupils equal round and reactive,?  Mild right gaze preference but she is readily able to cross midline, she is able to count fingers in all 4 visual quadrants Motor: She has a dense left hemiparesis, but also has some drift on the right  Pertinent Labs: Sodium 131 from 2/12 Creatinine 0.87  Impression: 69 year old female with recent watershed strokes who had an acute episode of altered mental status in the setting of severe hypotension.  I do not think that seizure was the driver of this episode, and gaze deviation may or may not have been associated with seizure, or focal deficits due to cerebral hypoperfusion.  The intermittent episodes of staring spells that she was having prior to this, however, are concerning for possible partial seizures.  The fact that she has no memory of the events but does not fully lose consciousness, coupled with the fact that she reportedly did not have severe hypotension with these events are things that support possible seizures.   The fact that she has not had any since starting Keppra would be an argument to continue this medication.  It is not clear to me at this time that she really needs it long-term, but at least in the short to intermediate term, I would favor continuing it as long as she does not have any further episodes.  If the episodes continue or worsen, then reconsideration of this may be needed.  Recommendations: 1) repeat BMP 2) continue Keppra at current dose 3) neurology will be available as needed moving forward, if she continues to have spells please call.  Roland Rack, MD [facility] [phone]  If 7pm- 7am,  please page neurology on call as listed in [place].

## 2019-09-29 NOTE — Telephone Encounter (Signed)
Duplicate encounter

## 2019-09-29 NOTE — Progress Notes (Signed)
Physical Therapy Weekly Progress Note  Patient Details  Name: [name] [name] MRN: [id number] Date of Birth: 08-30-1950  Beginning of progress report period: September 22, 2019 End of progress report period: September 29, 2019  Today's Date: 09/29/2019 PT Individual Time: 1015-1030 PT Individual Time Calculation (min): 15 min   Patient has met 0 of 4 short term goals. Pt continues to make minimal progress towards LTGs. She requires total assist for bed mobility, min-mod assist for static sitting balance, and +2 assist for sit<>stands and stedy transfers. She also now requires maximove transfer w/ nursing staff 2/2 significant skilled cues required for safe stedy transfer. She has improved to min cues at best for cues needed for initiation, L attention, midline orientation, and sequencing of tasks, however this progresses to max cues after 20-30 min of activity.   Patient continues to demonstrate the following deficits muscle weakness and muscle joint tightness, decreased cardiorespiratoy endurance, impaired timing and sequencing, abnormal tone, unbalanced muscle activation, motor apraxia, decreased coordination and decreased motor planning, decreased midline orientation, decreased attention to left and left side neglect, decreased initiation, decreased attention, decreased awareness, decreased problem solving and decreased safety awareness and decreased sitting balance, decreased standing balance, decreased postural control, hemiplegia and decreased balance strategies and therefore will continue to benefit from skilled PT intervention to increase functional independence with mobility and to decreased burden of care at next level.   Patient not progressing toward long term goals.  See goal revision..  Plan of care revisions: LTGs downgraded to mod-max assist overall, w/c level, 2/2 lack of progress.   PT Short Term Goals Week 2:  PT Short Term Goal 1 (Week 2): Pt will perform bed mobility w/ max  assist +1 PT Short Term Goal 1 - Progress (Week 2): Progressing toward goal PT Short Term Goal 2 (Week 2): Pt will perform sit<>stand w/ max assist +1 PT Short Term Goal 2 - Progress (Week 2): Not met PT Short Term Goal 3 (Week 2): Pt will maintain static sitting balance w/ CGA PT Short Term Goal 3 - Progress (Week 2): Not met PT Short Term Goal 4 (Week 2): Pt will initiate functional mobility w/ min cues 50% of the time PT Short Term Goal 4 - Progress (Week 2): Progressing toward goal Week 3:  PT Short Term Goal 1 (Week 3): =LTGs due to ELOS  Skilled Therapeutic Interventions/Progress Updates:   Pt in TIS and agreeable to therapy, no c/o pain. Total assist w/c transport to/from therapy gym. Worked on upright sitting balance in chair while using RUE to cross midline and grab horseshoes to toss. Pt tossed x2 w/ min cues. Pt then reported she needed to use the bathroom. Total assist transport back to room. +2 assist slide board transfer to EOB for time management. R/L rolling w/ max assist +1 w/ verbal and tactile cues for technique. Pt incontinent of void already. Total assist for brief management and pericare. Pt holding head entire time in supine, states she has a headache and is "so tired". RN made aware. Missed 30 min of skilled PT 2/2 fatigue/headache.   Therapy Documentation Precautions:  Precautions Precautions: Fall Precaution Comments: recent L humeral fx, no orders Restrictions Weight Bearing Restrictions: No Vital Signs: Therapy Vitals BP: (!) 148/54  Therapy/Group: Individual Therapy  Rodman Recupero Clent Demark 09/29/2019, 10:45 AM

## 2019-09-29 NOTE — Progress Notes (Signed)
Speech Language Pathology Daily Session Note  Patient Details  Name: [name] [name] MRN: [id number] Date of Birth: 1951/04/22  Today's Date: 09/29/2019 SLP Individual Time: 0715-0745 SLP Individual Time Calculation (min): 30 min  Short Term Goals: Week 3: SLP Short Term Goal 1 (Week 3): Pt will sustain attention to tasks for 5 minute intervals with Mod A cues for redirection. SLP Short Term Goal 2 (Week 3): Pt will recall new and/or complex daily information with Max A verbal/visual cues for use of compensatory strategies. SLP Short Term Goal 3 (Week 3): Pt will demonstrate problem solving during basic tasks with Max A verbal/visual cues. SLP Short Term Goal 4 (Week 3): Patient will scan to her left field of envionrment during functional tasks with Mod A multimodal cues.  Skilled Therapeutic Interventions: Skilled treatment session focused on cognitive goals. Upon arrival, patient was awake in bed while holding the phone up to her ear. Patient reported, she "was just listening" but the phone was not turned on. Patient required total A for repositioning and Max encouragement for PO intake. SLP provided Mod verbal cues for sustained attention to self-feeding and for scanning to midline to locate yogurt. Patient left upright in bed with all needs within reach and alarm on. Continue with current plan of care.      Pain No/Denies Pain   Therapy/Group: Individual Therapy  Imir Brumbach 09/29/2019, 7:53 AM

## 2019-09-29 NOTE — Plan of Care (Signed)
  Problem: RH Balance Goal: LTG Patient will maintain dynamic sitting balance (PT) Description: LTG:  Patient will maintain dynamic sitting balance with assistance during mobility activities (PT) Flowsheets (Taken 09/29/2019 1418) LTG: Pt will maintain dynamic sitting balance during mobility activities with:: (goal downgraded 2/18 due to lack of progress - [name] T) Moderate Assistance - Patient 50 - 74% Note: goal downgraded 2/18 due to lack of progress - [name] T Goal: LTG Patient will maintain dynamic standing balance (PT) Description: LTG:  Patient will maintain dynamic standing balance with assistance during mobility activities (PT) Outcome: Not Applicable Flowsheets (Taken 09/29/2019 1418) LTG: Pt will maintain dynamic standing balance during mobility activities with:: (goal discontinued 2/18 due to lack of progress - [name] T) -- Note: goal discontinued 2/18 due to lack of progress - [name] T   Problem: RH Bed to Chair Transfers Goal: LTG Patient will perform bed/chair transfers w/assist (PT) Description: LTG: Patient will perform bed to chair transfers with assistance (PT). Flowsheets (Taken 09/29/2019 1418) LTG: Pt will perform Bed to Chair Transfers with assistance level: (goal downgraded 2/18 due to lack of progress - [name] T) Maximal Assistance - Patient 25 - 49% Note: goal downgraded 2/18 due to lack of progress - [name] T   Problem: RH Car Transfers Goal: LTG Patient will perform car transfers with assist (PT) Description: LTG: Patient will perform car transfers with assistance (PT). Outcome: Not Applicable Flowsheets (Taken 09/29/2019 1418) LTG: Pt will perform car transfers with assist:: (goal discontinued 2/18 due to lack of progress - [name] T) -- Note: goal discontinued 2/18 due to lack of progress - [name] T

## 2019-09-29 NOTE — Progress Notes (Signed)
Responded to PIV consult. PT working with patient until 0900. VAST to return at a later time.

## 2019-09-29 NOTE — Progress Notes (Signed)
Occupational Therapy Session Note  Patient Details  Name: [name] [name] MRN: [id number] Date of Birth: 03-Apr-1951  Today's Date: 09/29/2019 OT Individual Time: 0815-0900 OT Individual Time Calculation (min): 45 min    Short Term Goals: Week 1:  OT Short Term Goal 1 (Week 1): Pt will complete sit<>stand with consistent mod A in preparation for BADL tasks OT Short Term Goal 1 - Progress (Week 1): Not met OT Short Term Goal 2 (Week 1): Pt will locate 2 grooming items on L side of the sink with min questioing cues OT Short Term Goal 2 - Progress (Week 1): Met OT Short Term Goal 3 (Week 1): Pt will complete 1 step of UB dressing task without verbal cues OT Short Term Goal 3 - Progress (Week 1): Progressing toward goal  Skilled Therapeutic Interventions/Progress Updates:    1:1. Pt received in bed agreeable to OT with no pain reported. Pt completes rolling with max-total +2 A to don breif/cleanse peri area after incontinent bladder movement and don pants. Supine>sitting EOB with MAX-total A and VC for sequencing. Lateral scoot transfer with SB to L with total A and VC for initiation via counting and manual facilitation of hand placement. +2 steadying equipment. Pt dons shirt with max A as pt able to thread head. When asked sequencing questions pt states, "I dont know. im tired, Ok?" pt grooms seated at sink with VC for L attention and head turns and VC for termination of tasks. Exited sesion seated in TIS, tilted back, belt alarm on and call light tinr each  Therapy Documentation Precautions:  Precautions Precautions: Fall Precaution Comments: recent L humeral fx, no orders Restrictions Weight Bearing Restrictions: No General:   Vital Signs: Therapy Vitals BP: (!) 148/54 Pain:   ADL: ADL Eating: Minimal assistance Grooming: Minimal assistance Upper Body Bathing: Not assessed Lower Body Bathing: Not assessed Upper Body Dressing: Maximal assistance Lower Body Dressing: Maximal  assistance, Dependent Toileting: Maximal assistance Toilet Transfer: Dependent, Maximal assistance Vision   Perception    Praxis   Exercises:   Other Treatments:     Therapy/Group: Individual Therapy  Tonny Branch 09/29/2019, 9:00 AM

## 2019-09-29 NOTE — Plan of Care (Signed)
  Problem: RH Problem Solving Goal: LTG Patient will demonstrate problem solving for (SLP) Description: LTG:  Patient will demonstrate problem solving for basic/complex daily situations with cues  (SLP) Flowsheets (Taken 09/29/2019 0751) LTG: Patient will demonstrate problem solving for (SLP): Basic daily situations LTG Patient will demonstrate problem solving for: Maximal Assistance - Patient 25 - 49% Note: Downgraded due to lack of progress   Problem: RH Memory Goal: LTG Patient will use memory compensatory aids to (SLP) Description: LTG:  Patient will use memory compensatory aids to recall biographical/new, daily complex information with cues (SLP) Flowsheets (Taken 09/29/2019 0751) LTG: Patient will use memory compensatory aids to (SLP): Maximal Assistance - Patient 25 - 49%   Problem: RH Attention Goal: LTG Patient will demonstrate this level of attention during functional activites (SLP) Description: LTG:  Patient will will demonstrate this level of attention during functional activites (SLP) Flowsheets (Taken 09/29/2019 0751) Patient will demonstrate during cognitive/linguistic activities the attention type of: Sustained LTG: Patient will demonstrate this level of attention during cognitive/linguistic activities with assistance of (SLP): Maximal Assistance - Patient 25 - 49% Note: Downgraded due to lack of progress   Problem: RH Awareness Goal: LTG: Patient will demonstrate awareness during functional activites type of (SLP) Description: LTG: Patient will demonstrate awareness during functional activites type of (SLP) Flowsheets (Taken 09/29/2019 0751) Patient will demonstrate during cognitive/linguistic activities awareness type of: Intellectual LTG: Patient will demonstrate awareness during cognitive/linguistic activities with assistance of (SLP): Maximal Assistance - Patient 25 - 49% Note: Downgraded due to lack of progress

## 2019-09-30 ENCOUNTER — Inpatient Hospital Stay (HOSPITAL_COMMUNITY): Payer: Medicare Other

## 2019-09-30 ENCOUNTER — Inpatient Hospital Stay (HOSPITAL_COMMUNITY): Payer: Medicare Other | Admitting: Speech Pathology

## 2019-09-30 LAB — GLUCOSE, CAPILLARY
Glucose-Capillary: 136 mg/dL — ABNORMAL HIGH (ref 70–99)
Glucose-Capillary: 110 mg/dL — ABNORMAL HIGH (ref 70–99)
Glucose-Capillary: 123 mg/dL — ABNORMAL HIGH (ref 70–99)
Glucose-Capillary: 119 mg/dL — ABNORMAL HIGH (ref 70–99)

## 2019-09-30 MED ORDER — LEVETIRACETAM 500 MG PO TABS
1000.0000 mg | ORAL_TABLET | Freq: Two times a day (BID) | ORAL | Status: DC
  Administered 2019-09-30 – 2019-10-07 (×15): 1000 mg via ORAL
  Filled 2019-09-30 (×15): qty 2

## 2019-09-30 MED ORDER — FUROSEMIDE 20 MG PO TABS
20.0000 mg | ORAL_TABLET | Freq: Every day | ORAL | Status: DC
  Administered 2019-10-04 – 2019-10-07 (×4): 20 mg via ORAL
  Filled 2019-09-30 (×4): qty 1

## 2019-09-30 NOTE — NC FL2 (Signed)
[place] LEVEL OF CARE SCREENING TOOL     IDENTIFICATION  Patient Name: [name] [name] Birthdate: 01-May-1951 Sex: female Admission Date (Current Location): 09/14/2019  [facility] and [place] Number:  Herbalist and Address:  [facility]. [facility], [place] [address] [place], [place] [address]      Provider Number: [id number]  Attending Physician Name and Address:  Meredith Staggers, MD  Relative Name and Phone Number:  son, Cenia Furrow @ [phone]    Current Level of Care: Other (Comment)(Acute Inpatient Rehab) Recommended Level of Care: [place] Prior Approval Number:    Date Approved/Denied:   PASRR Number: [id number]  Discharge Plan: SNF    Current Diagnoses: Patient Active Problem List   Diagnosis Date Noted  . Seizures ([place])   . Spastic hemiparesis of right dominant side ()   . Acute bilat watershed infarction [facility]) 09/14/2019  . CKD (chronic kidney disease), stage II   . Uncontrolled type 2 diabetes mellitus with hyperglycemia ([place])   . PFO (patent foramen ovale)   . Acute CVA (cerebrovascular accident) ([place]) 09/11/2019  . Subarachnoid hemorrhage ([place]) 09/11/2019  . DVT (deep venous thrombosis) ([place]) 09/11/2019  . Aneurysm, thoracic aortic ([place])   . Embolic stroke ([place])   . Pulmonary embolism ([place])   . Acute cerebrovascular accident (CVA) ([place]) 08/18/2019  . COPD (chronic obstructive pulmonary disease) ([place])   . COPD exacerbation ([place]) 08/12/2018  . Hyponatremia 08/12/2018  . Acute respiratory failure with hypoxia ([place]) 10/26/2015  . CAP (community acquired pneumonia) 10/26/2015  . Type 2 diabetes mellitus ([place]) 10/26/2015  . Overactive bladder 09/10/2015  . Diabetes mellitus type 2, controlled, without complications ([place])   . Depression   . Insomnia   . GERD (gastroesophageal reflux disease)   . Hypercholesteremia   . Hypertension   . Chronic kidney disease   . Overweight 10/29/2009  . GERD  06/20/2008  . CHEST PAIN 06/20/2008  . CHOLECYSTECTOMY, HX OF 06/20/2008  . HYPERLIPIDEMIA 12/11/2007  . TOBACCO ABUSE 12/11/2007  . DEPRESSION 12/11/2007  . Essential hypertension 12/11/2007  . Coronary atherosclerosis 12/11/2007  . DUODENITIS, WITH HEMORRHAGE 12/11/2007    Orientation RESPIRATION BLADDER Height & Weight     Self  Normal Incontinent Weight: 184 lb 4.9 oz (83.6 kg) Height:  5\' 4"  (162.6 cm)  BEHAVIORAL SYMPTOMS/MOOD NEUROLOGICAL BOWEL NUTRITION STATUS    Convulsions/Seizures Incontinent Diet  AMBULATORY STATUS COMMUNICATION OF NEEDS Skin   Total Care Verbally Normal                       Personal Care Assistance Level of Assistance  Bathing, Feeding, Dressing, Total care Bathing Assistance: Maximum assistance Feeding assistance: Limited assistance Dressing Assistance: Maximum assistance Total Care Assistance: Maximum assistance   Functional Limitations Info             SPECIAL CARE FACTORS FREQUENCY  PT (By licensed PT), Speech therapy, OT (By licensed OT)     PT Frequency: 5x/wk OT Frequency: 5x/wk     Speech Therapy Frequency: 5x/wk      Contractures Contractures Info: Not present    Additional Factors Info  Code Status, Psychotropic, Insulin Sliding Scale Code Status Info: Full   Psychotropic Info: see MAR Insulin Sliding Scale Info: see MAR       Current Medications (09/30/2019):  This is the current hospital active medication list Current Facility-Administered Medications  Medication Dose Route Frequency Provider Last Rate Last Admin  .  0.9 % NaCl with KCl 20 mEq/ L  infusion   Intravenous Continuous Bary Leriche, PA-C 75 mL/hr at 09/30/19 0045 New Bag at 09/30/19 0045  . acetaminophen (TYLENOL) tablet 650 mg  650 mg Oral Q4H PRN Cathlyn Parsons, PA-C   650 mg at 09/29/19 1215   Or  . acetaminophen (TYLENOL) 160 MG/5ML solution 650 mg  650 mg Per Tube Q4H PRN Angiulli, Lavon Paganini, PA-C       Or  . acetaminophen (TYLENOL)  suppository 650 mg  650 mg Rectal Q4H PRN Angiulli, Lavon Paganini, PA-C      . apixaban (ELIQUIS) tablet 5 mg  5 mg Oral BID AngiulliLavon Paganini, PA-C   5 mg at 09/30/19 0849  . atorvastatin (LIPITOR) tablet 20 mg  20 mg Oral QHS Cathlyn Parsons, PA-C   20 mg at 09/29/19 2108  . diazepam (VALIUM) tablet 2 mg  2 mg Oral Daily PRN Cathlyn Parsons, PA-C   2 mg at 09/29/19 2108  . diltiazem (CARDIZEM CD) 24 hr capsule 120 mg  120 mg Oral Daily Cathlyn Parsons, PA-C   120 mg at 09/30/19 [id number]  . escitalopram (LEXAPRO) tablet 10 mg  10 mg Oral Daily Cathlyn Parsons, PA-C   10 mg at 09/30/19 0848  . furosemide (LASIX) tablet 20 mg  20 mg Oral Daily Cathlyn Parsons, PA-C   20 mg at 09/30/19 0848  . insulin aspart (novoLOG) injection 0-9 Units  0-9 Units Subcutaneous TID WC Cathlyn Parsons, PA-C   1 Units at 09/30/19 [phone]  . ipratropium-albuterol (DUONEB) 0.5-2.5 (3) MG/3ML nebulizer solution 3 mL  3 mL Nebulization Q6H PRN Angiulli, Lavon Paganini, PA-C      . levETIRAcetam (KEPPRA) IVPB 1000 mg/100 mL premix  1,000 mg Intravenous Q12H Lovorn, Megan, MD 400 mL/hr at 09/30/19 0102 1,000 mg at 09/30/19 0102  . lidocaine (LIDODERM) 5 % 1 patch  1 patch Transdermal Q24H Cathlyn Parsons, PA-C   1 patch at 09/29/19 1132  . loperamide (IMODIUM) capsule 2 mg  2 mg Oral PRN Cathlyn Parsons, PA-C   2 mg at 09/22/19 0039  . losartan (COZAAR) tablet 50 mg  50 mg Oral Daily Cathlyn Parsons, PA-C   50 mg at 09/30/19 0849  . metFORMIN (GLUCOPHAGE) tablet 500 mg  500 mg Oral BID WC Lovorn, Megan, MD   500 mg at 09/30/19 0848  . Muscle Rub CREA   Topical BID Meredith Staggers, MD   Given at 09/30/19 [phone]  . potassium chloride SA (KLOR-CON) CR tablet 20 mEq  20 mEq Oral Daily Meredith Staggers, MD   20 mEq at 09/30/19 0848  . rOPINIRole (REQUIP) tablet 2 mg  2 mg Oral TID Cathlyn Parsons, PA-C   2 mg at 09/30/19 0848  . senna-docusate (Senokot-S) tablet 1 tablet  1 tablet Oral QHS PRN Angiulli, Lavon Paganini,  PA-C      . simethicone [facility]) chewable tablet 80 mg  80 mg Oral QID PRN Cathlyn Parsons, PA-C   80 mg at 09/22/19 1348  . sorbitol 70 % solution 30 mL  30 mL Oral Daily PRN Cathlyn Parsons, PA-C   30 mL at 09/17/19 2035  . traMADol (ULTRAM) tablet 25 mg  25 mg Oral Q12H PRN Meredith Staggers, MD   25 mg at 09/25/19 [id number]     Discharge Medications: Please see discharge summary for a list of discharge medications.  Relevant Imaging Results:  Relevant Lab Results:   Additional Information SS# [id number]  Lennart Pall, LCSW

## 2019-09-30 NOTE — Progress Notes (Signed)
Occupational Therapy Weekly Progress Note  Patient Details  Name: EVIE CROSTON MRN: [id number] Date of Birth: 1951-07-12  Beginning of progress report period: September 22, 2019 End of progress report period: September 30, 2019  Today's Date: 09/30/2019 OT Individual Time: 1115-1200 OT Individual Time Calculation (min): 45 min    Patient has met 1 of 4 short term goals.  Pt continues to make minimal progress. Pt continues to have medical issues impacting participation as well as fatigue. Pt currently requires +2 A for all functional transfers and max-total A for ADLs at bed level of seated in TIS. Pt continues to be limited by severe apraxia, L neglect and deficits in cognition. Pt schedule has been downgraded to QD d/t limited participation while seeking options for next level of care.   Patient continues to demonstrate the following deficits: muscle weakness, decreased cardiorespiratoy endurance, impaired timing and sequencing, abnormal tone, unbalanced muscle activation, motor apraxia, ataxia, decreased coordination and decreased motor planning, left side neglect and ideational apraxia, decreased initiation, decreased attention, decreased awareness, decreased problem solving, decreased safety awareness, decreased memory and delayed processing and decreased sitting balance, decreased standing balance, decreased postural control, hemiplegia and decreased balance strategies and therefore will continue to benefit from skilled OT intervention to enhance overall performance with BADL and iADL.  Patient not progressing toward long term goals.  See goal revision..  Plan of care revisions: discontinued dynamic standing balance and tub transfer goals, likely need to downgrade remainder of goals to MAX A d/t lack of progress and new functional deficits d/t recurrent CVA/medical issues.  OT Short Term Goals Week 2:  OT Short Term Goal 1 (Week 2): Pt will complete 1 step of UB dressing to demo improved  apraxia OT Short Term Goal 1 - Progress (Week 2): Met OT Short Term Goal 2 (Week 2): Pt will sit EOB 5 min wiht MIN A for sitting balnace OT Short Term Goal 2 - Progress (Week 2): Progressing toward goal OT Short Term Goal 3 (Week 2): Pt will locate items on sink/tray on L with no more than 1 VC OT Short Term Goal 3 - Progress (Week 2): Progressing toward goal OT Short Term Goal 4 (Week 2): Pt will sit to stand in stedy wiht MAX A of 1 to decrease BOC OT Short Term Goal 4 - Progress (Week 2): Progressing toward goal Week 3:  OT Short Term Goal 1 (Week 3): Pt will sit EOB/EOM for 5 min with no more than min A for static sitting balance OT Short Term Goal 2 (Week 3): Pt will locate grooming items at midline for improved visual scanning and no more than 1 VC OT Short Term Goal 3 (Week 3): Pt will initiate scooting hips across slide board with multimodal cuing and max A consistently Week 4:     Skilled Therapeutic Interventions/Progress Updates:    1;1. Pt received in TIS agreeable to OT and no c/o pain. Pt completes lateral scoot transfer across SB in B directions TIS<>EOM/EOB with total A +2 with VC for hand placement and initiation with scooting via counting. Pt able to sit EOB with tacilte cues and up to min A for posture. MAX VC for using head control for righting reactions to improve trunk control/weight shifting ant/post for 5 min EOM. Pt noticably wet and returned to room. Mod increasing to total A to roll B in bed d/t inattention. Pt states, "just because youre looking at me isnt going to make me do it." Pt requires  tactile cues to reach across to locate grab bars. Exited session after total A peri care/brief change, in bed, exit alarm on and call light inreach  Therapy Documentation Precautions:  Precautions Precautions: Fall Precaution Comments: recent L humeral fx, no orders Restrictions Weight Bearing Restrictions: No General:   Vital Signs: Therapy Vitals Temp: 98.6 F (37  C) Pulse Rate: 94 Resp: 18 BP: 108/79 Patient Position (if appropriate): Lying Oxygen Therapy SpO2: 96 % O2 Device: Room Air Pain:   ADL: ADL Eating: Minimal assistance Grooming: Minimal assistance Upper Body Bathing: Not assessed Lower Body Bathing: Not assessed Upper Body Dressing: Maximal assistance Lower Body Dressing: Maximal assistance, Dependent Toileting: Maximal assistance Toilet Transfer: Dependent, Maximal assistance Vision   Perception    Praxis   Exercises:   Other Treatments:     Therapy/Group: Individual Therapy  Tonny Branch 09/30/2019, 6:51 AM

## 2019-09-30 NOTE — Progress Notes (Signed)
Physical Therapy Session Note  Patient Details  Name: [name] [name] MRN: [id number] Date of Birth: Apr 08, 1951  Today's Date: 09/30/2019 PT Individual Time: [id number] PT Individual Time Calculation (min): 40 min   Short Term Goals: Week 3:  PT Short Term Goal 1 (Week 3): =LTGs due to ELOS  Skilled Therapeutic Interventions/Progress Updates:   Pt in supine and agreeable to therapy, no c/o pain. Pt incontinent of void. R/L rolling w/ max-total assist w/ verbal and tactile cues for technique, 2nd helper performing pericare and brief management. Total assist to don pants and to pull over hips during rolling. Supine>sit w/ total assist +1. Again, verbal and tactile cues for technique. Min-mod assist to sit EOB while 2nd helper set up transfer, total assist to change shirt for time management. Slide board transfer to TIS w/ total assist +2. Verbal and manual cues for anterior weight shifting at hips and to use RUE/LE to scoot across board. Pt did make attempt to push, but it was not sufficient enough to scoot herself. Total assist w/c transport to/from therapy gym. Attempted to work on sit<>stands at rail. After multiple attempts, was able to stand w/ +2 assist but unable to remain standing 2/2 poor motor planning and R side extensor bias. Transitioned to standing w/ stedy, sit>stand w/ +2 assist w/ max tactile, verbal, and visual cues to achieve midline and for upright posture. Pt most successful w/ mirror for visual feedback and cues to bring R shoulder to therapist's shoulder on R side. Cues also for RUE management to prevent pushing and for increased glut activation to bring pelvis anteriorly. Performed x3-4 stands in total w/ max-total assist +2 to boost into stance and to maintain standing. Returned to room and ended session tilted in TIS, all needs in reach.   Therapy Documentation Precautions:  Precautions Precautions: Fall Precaution Comments: recent L humeral fx, no  orders Restrictions Weight Bearing Restrictions: No Vital Signs: Therapy Vitals Temp: 98.6 F (37 C) Pulse Rate: 94 Resp: 18 BP: 114/80 Patient Position (if appropriate): Lying Oxygen Therapy SpO2: 96 % O2 Device: Room Air Pain: Pain Assessment Pain Scale: 0-10 Pain Score: 0-No pain  Therapy/Group: Individual Therapy  [name] [name] [name] [name] 09/30/2019, 8:56 AM

## 2019-09-30 NOTE — Progress Notes (Signed)
Social Work Patient ID: [name] [name], female   DOB: [birth year], 69 y.o.   MRN: [id number]  Spoke with pt's son yesterday afternoon about pt's current status/ care needs and he confirms that he would like to have SW pursue SNF.  Will begin bed search process.    Madaleine Simmon, LCSW

## 2019-09-30 NOTE — Progress Notes (Addendum)
[facility] PHYSICAL MEDICINE & REHABILITATION PROGRESS NOTE   Subjective/Complaints: Pt up in w/c. Just got finished with a session of PT. Can't recall what she worked on with PT.   ROS: Limited due to cognitive/behavioral   Objective:   EEG adult  Result Date: 09/28/2019 Lora Havens, MD     09/28/2019  4:02 PM Patient Name: [name] [name] MRN: [id number] Epilepsy Attending: Lora Havens Referring Physician/Provider: Olin Hauser love, PA Date: 09/27/2019 Duration: 22.38 minutes Patient history: 69 year old female with subacute cerebral infarcts bilaterally, right greater than left who was noted to have an episode of unresponsiveness with left gaze preference.  EEG to evaluate for seizures. Level of alertness: Awake AEDs during EEG study: Keppra Technical aspects: This EEG study was done with scalp electrodes positioned according to the 10-20 International system of electrode placement. Electrical activity was acquired at a sampling rate of 500Hz  and reviewed with a high frequency filter of 70Hz  and a low frequency filter of 1Hz . EEG data were recorded continuously and digitally stored. Description: No clear posterior dominant was seen.  EEG showed continuous generalized rhythmic 2 to 3 Hz delta slowing.  Hyperventilation photic summation were not performed. Abnormalities -Continuous slow, generalized IMPRESSION: This study is suggestive of moderate to severe diffuse encephalopathy, nonspecific to etiology but could be secondary to underlying strokes. No seizures or epileptiform discharges were seen throughout the recording. Lora Havens   Recent Labs    09/28/19 1227  WBC 10.0  HGB 13.6  HCT 41.2  PLT 413*   Recent Labs    09/29/19 1059  NA 134*  K 3.4*  CL 100  CO2 23  GLUCOSE 133*  BUN 7*  CREATININE 0.60  CALCIUM 9.1    Intake/Output Summary (Last 24 hours) at 09/30/2019 1001 Last data filed at 09/30/2019 0730 Gross per 24 hour  Intake 340 ml  Output --  Net 340 ml      Physical Exam: Vital Signs Blood pressure 114/80, pulse 94, temperature 98.6 F (37 C), resp. rate 18, height 5\' 4"  (1.626 m), weight 83.6 kg, SpO2 96 %. Constitutional: No distress . Vital signs reviewed. HEENT: EOMI, oral membranes moist Neck: supple Cardiovascular: RRR without murmur. No JVD    Respiratory: CTA Bilaterally without wheezes or rales. Normal effort    GI: BS +, non-tender, non-distended  Musculoskeletal:   No pain with upper extremity range of motion Neurological: oriented name, place, "had stroke" Motor: RUE/RLE: 4/5 proximal distal unchanged LUE: 0/5 with continued flexor tone 2/4 LLE: 0/5 continued extensor tone 1+/4  Skin: Skin is warm and dry.  Psychiatric: flat but engages easily  Assessment/Plan: 1. Functional deficits secondary to bicortical infarcts, Right frontal SAH which require 3+ hours per day of interdisciplinary therapy in a comprehensive inpatient rehab setting.  Physiatrist is providing close team supervision and 24 hour management of active medical problems listed below.  Physiatrist and rehab team continue to assess barriers to discharge/monitor patient progress toward functional and medical goals  Care Tool:  Bathing  Bathing activity did not occur: Refused           Bathing assist       Upper Body Dressing/Undressing Upper body dressing   What is the patient wearing?: Pull over shirt    Upper body assist Assist Level: Maximal Assistance - Patient 25 - 49%    Lower Body Dressing/Undressing Lower body dressing      What is the patient wearing?: Pants     Lower body  assist Assist for lower body dressing: Total Assistance - Patient < 25%     Toileting Toileting    Toileting assist Assist for toileting: Dependent - Patient 0%     Transfers Chair/bed transfer  Transfers assist     Chair/bed transfer assist level: 2 Helpers     Locomotion Ambulation   Ambulation assist   Ambulation activity did not occur:  Safety/medical concerns          Walk 10 feet activity   Assist  Walk 10 feet activity did not occur: Safety/medical concerns        Walk 50 feet activity   Assist Walk 50 feet with 2 turns activity did not occur: Safety/medical concerns         Walk 150 feet activity   Assist Walk 150 feet activity did not occur: Safety/medical concerns         Walk 10 feet on uneven surface  activity   Assist Walk 10 feet on uneven surfaces activity did not occur: Safety/medical concerns         Wheelchair     Assist Will patient use wheelchair at discharge?: No(LT goals not set )             Wheelchair 50 feet with 2 turns activity    Assist            Wheelchair 150 feet activity     Assist          Blood pressure 114/80, pulse 94, temperature 98.6 F (37 C), resp. rate 18, height 5\' 4"  (1.626 m), weight 83.6 kg, SpO2 96 %.  Medical Problem List and Plan: 1.  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 Patient with bilateral cerebral and right occipital infarcts last week as well as pulmonary emboli with DVT.  -recent MRI   revealed progression of bi-frontal infarcts as well as new mall infarcts right lateral occ lobe   -?venous source given PFO given lack of other obvious etiologies. IVCF placed as recommended by neurology, completed by IR on 09/23/2018 right IJ route  2/19-emerging tone: unable to tolerate tizanidine.     -will stay with splinting/ROM for now    -botox as outpt  2/19-now looking at SNF placement given extension of CVA's and pt's increased anticipated care needs at home                2. DVT/anticoagulation/Hx of PE: Eliquis             -antiplatelet therapy: N/A 3. Pain Management: Lidoderm patch change as directed  -hydrocodone  d/t ams  -sports cream  -  low dose tramadol 25mg  q8 prn  -left RTC/OA shoulder: had left shoulder injection 2  mos ago apparently   -pain worsened by left HP, capsulitis   -consider another injection at some point when more medically stable   -continue shoulder ROM and support  -emerging tone LUE/LLE- ROM/orthotics for now   -WHO for LUE   -hesitate medicating d/t recent lethargy  2/19: pain appearscontrolled.  4. Mood: Valium as needed for anxiety and depression             -antipsychotic agents: N/A  -expressing some anxiety/depression/distraction d/t husband's suicide. Has difficulty disengaging from thoughts at times.    -neuropsych input  requested     -on lexapro 10mg  daily currently 5. Neuropsych: This patient is capable of making decisions on her own behalf. 6. Skin/Wound Care: Routine  skin checks 7. Fluids/Electrolytes/Nutrition: encourage PO  -2/18 K+ 3.4---replace   -BUN/Cr ok  2/19 DC IVF 8.  PFO.  Follow cardiology services.  TEE completed, suggesting PFO, await further Cards recs.. 9.  Hypertension.  Cardizem 120 mg daily, Lasix 20 mg daily, Cozaar 50 mg daily            2/19  allow permissive HTN for adequate cerebral perfusion. 10.  Hyperlipidemia: Continue Lipitor 11.  Diabetes mellitus with hyperglycemia.  Hemoglobin A1c 9.4.  SSI.  Patient on Glucophage 1000 mg twice daily prior to admission.  Resume as needed           CBG (last 3)  Recent Labs    09/29/19 1709 09/29/19 2104 09/30/19 0635  GLUCAP 179* 87 136*  Controlled 09/30/2019-no changes today 13.  CKD stage II.                2/8 BUN/Cr 8/0.6 14.  Restless leg syndrome.  Requip 2 mg 3 times daily--Initially Reduced to HS d/t lethargy  2/11 home requip resumed d/t improved arousal.  15. Unresponsive episode Tuesday afternoon   ?seizure   -keppra initiated after episode on 2/16   -EEG demonstrated generalized slowing, CT without changes  2/19: continue keppra, change to oral   -maintain adequate po   -avoid spasticity medications. Suspect episode was d/t low bp/tizanidine effects   LOS: 16 days A FACE TO  FACE EVALUATION WAS PERFORMED  Meredith Staggers 09/30/2019, 10:01 AM

## 2019-09-30 NOTE — Progress Notes (Signed)
Speech Language Pathology Daily Session Note  Patient Details  Name: [name] [name] MRN: [id number] Date of Birth: 1951-06-27  Today's Date: 09/30/2019 SLP Individual Time: [id number] SLP Individual Time Calculation (min): 30 min  Short Term Goals: Week 3: SLP Short Term Goal 1 (Week 3): Pt will sustain attention to tasks for 5 minute intervals with Mod A cues for redirection. SLP Short Term Goal 2 (Week 3): Pt will recall new and/or complex daily information with Max A verbal/visual cues for use of compensatory strategies. SLP Short Term Goal 3 (Week 3): Pt will demonstrate problem solving during basic tasks with Max A verbal/visual cues. SLP Short Term Goal 4 (Week 3): Patient will scan to her left field of envionrment during functional tasks with Mod A multimodal cues.  Skilled Therapeutic Interventions:  Skilled treatment session targeted cognition goals. SLP facilitated session by providing Max A multimodal cues to scan to left of midline and Mod A multimodal cues to scan to midline to locate basic familiar items like lotion and comb. Pt required Max A multimodal cues to initial combing her hair and initiating application of lotion. SLP further facilitated session by providing Total A multimodal cues for orientation information. Pt left upright in her wheelchair, lap belt alarm on and all needs within reach. Continue per current plan of care.      Pain Pain Assessment Pain Scale: 0-10 Pain Score: 0-No pain  Therapy/Group: Individual Therapy  Jessi Jessop 09/30/2019, 10:59 AM

## 2019-10-01 ENCOUNTER — Inpatient Hospital Stay (HOSPITAL_COMMUNITY): Payer: Medicare Other

## 2019-10-01 ENCOUNTER — Inpatient Hospital Stay (HOSPITAL_COMMUNITY): Payer: Medicare Other | Admitting: Occupational Therapy

## 2019-10-01 DIAGNOSIS — E871 Hypo-osmolality and hyponatremia: Secondary | ICD-10-CM

## 2019-10-01 DIAGNOSIS — E119 Type 2 diabetes mellitus without complications: Secondary | ICD-10-CM

## 2019-10-01 DIAGNOSIS — I639 Cerebral infarction, unspecified: Secondary | ICD-10-CM

## 2019-10-01 DIAGNOSIS — E876 Hypokalemia: Secondary | ICD-10-CM

## 2019-10-01 DIAGNOSIS — F329 Major depressive disorder, single episode, unspecified: Secondary | ICD-10-CM

## 2019-10-01 DIAGNOSIS — R0989 Other specified symptoms and signs involving the circulatory and respiratory systems: Secondary | ICD-10-CM

## 2019-10-01 LAB — GLUCOSE, CAPILLARY
Glucose-Capillary: 115 mg/dL — ABNORMAL HIGH (ref 70–99)
Glucose-Capillary: 122 mg/dL — ABNORMAL HIGH (ref 70–99)
Glucose-Capillary: 130 mg/dL — ABNORMAL HIGH (ref 70–99)
Glucose-Capillary: 134 mg/dL — ABNORMAL HIGH (ref 70–99)

## 2019-10-01 NOTE — Progress Notes (Signed)
[facility] PHYSICAL MEDICINE & REHABILITATION PROGRESS NOTE   Subjective/Complaints: Patient seen laying in bed this morning.  No reported issues overnight.  She is still sleepy this morning, with limited engagement.  ROS: Limited due to cognition/behavior.  Objective:   No results found. No results for input(s): WBC, HGB, HCT, PLT in the last 72 hours. Recent Labs    09/29/19 1059  NA 134*  K 3.4*  CL 100  CO2 23  GLUCOSE 133*  BUN 7*  CREATININE 0.60  CALCIUM 9.1    Intake/Output Summary (Last 24 hours) at 10/01/2019 1429 Last data filed at 10/01/2019 0730 Gross per 24 hour  Intake 150 ml  Output --  Net 150 ml     Physical Exam: Vital Signs Blood pressure (!) 174/64, pulse 90, temperature 97.9 F (36.6 C), resp. rate 18, height 5\' 4"  (1.626 m), weight 83.6 kg, SpO2 94 %. Constitutional: No distress . Vital signs reviewed. HENT: Normocephalic.  Atraumatic. Eyes: Keeps eyes closed.  No discharge. Cardiovascular: No JVD. Respiratory: Normal effort.  No stridor. GI: Non-distended. Skin: Warm and dry.  Intact. Psych: Unable to assess due to mentation. Musc: No edema in extremities.  No tenderness in extremities. Neurological: Somnolent. No movement noted on left side. Increased tone noted in left upper and left lower extremity. Assessment/Plan: 1. Functional deficits secondary to bicortical infarcts, Right frontal SAH which require 3+ hours per day of interdisciplinary therapy in a comprehensive inpatient rehab setting.  Physiatrist is providing close team supervision and 24 hour management of active medical problems listed below.  Physiatrist and rehab team continue to assess barriers to discharge/monitor patient progress toward functional and medical goals  Care Tool:  Bathing  Bathing activity did not occur: Refused           Bathing assist       Upper Body Dressing/Undressing Upper body dressing   What is the patient wearing?: Pull over shirt     Upper body assist Assist Level: Maximal Assistance - Patient 25 - 49%    Lower Body Dressing/Undressing Lower body dressing      What is the patient wearing?: Pants     Lower body assist Assist for lower body dressing: Total Assistance - Patient < 25%     Toileting Toileting    Toileting assist Assist for toileting: Dependent - Patient 0%     Transfers Chair/bed transfer  Transfers assist     Chair/bed transfer assist level: 2 Helpers     Locomotion Ambulation   Ambulation assist   Ambulation activity did not occur: Safety/medical concerns          Walk 10 feet activity   Assist  Walk 10 feet activity did not occur: Safety/medical concerns        Walk 50 feet activity   Assist Walk 50 feet with 2 turns activity did not occur: Safety/medical concerns         Walk 150 feet activity   Assist Walk 150 feet activity did not occur: Safety/medical concerns         Walk 10 feet on uneven surface  activity   Assist Walk 10 feet on uneven surfaces activity did not occur: Safety/medical concerns         Wheelchair     Assist Will patient use wheelchair at discharge?: No(LT goals not set )             Wheelchair 50 feet with 2 turns activity    Assist  Wheelchair 150 feet activity     Assist          Blood pressure (!) 174/64, pulse 90, temperature 97.9 F (36.6 C), resp. rate 18, height 5\' 4"  (1.626 m), weight 83.6 kg, SpO2 94 %.  Medical Problem List and Plan: 1.  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 Patient with bilateral cerebral and right occipital infarcts as well as pulmonary emboli with DVT.  -recent MRI revealed progression of bi-frontal infarcts as well as new mall infarcts right lateral occ lobe   -?venous source given PFO and lack of other obvious etiologies. IVCF placed as recommended by  neurology, completed by IR on 09/23/2018 right IJ route  Unable to tolerate tizanidine.     Continue orthoses and range of motion    -botox as outpt  Continue CIR-plan for SNF given extension of CVA's and pt's increased anticipated care needs at home 2. DVT/anticoagulation/Hx of PE: Eliquis             -antiplatelet therapy: N/A 3. Pain Management: Lidoderm patch change as directed  -hydrocodone  D/ced due to ams  -sports cream  -low dose tramadol 25mg  q8 prn  -left RTC/OA shoulder: had left shoulder injection 2 mos ago apparently   -pain worsened by left HP, capsulitis   Consider another injection at some point when more medically stable   -continue shoulder ROM and support  Appears controlled on 2/20 4. Mood: Valium as needed for anxiety and depression             -antipsychotic agents: N/A  -expressing some anxiety/depression/distraction d/t husband's suicide. Has difficulty disengaging from thoughts at times.    -Await neuropsych input     -on lexapro 10mg  daily currently 5. Neuropsych: This patient is not fully capable of making decisions on her own behalf. 6. Skin/Wound Care: Routine skin checks 7. Fluids/Electrolytes/Nutrition: encourage PO  Potassium 3.4 on 2/18, continue supplementation-labs ordered for Monday  Sodium 134 on 2/18, labs ordered for Monday  8.  PFO.  Follow cardiology services.  TEE completed, suggesting PFO, await further Cards recs.. 9.  Hypertension.  Cardizem 120 mg daily, Lasix 20 mg daily, Cozaar 50 mg daily            Labile on 2/20, monitor for trend, avoid hypoperfusion 10.  Hyperlipidemia: Continue Lipitor 11.  Diabetes mellitus with hyperglycemia.  Hemoglobin A1c 9.4.  SSI.  Patient on Glucophage 1000 mg twice daily prior to admission.  Resume as needed           CBG (last 3)  Recent Labs    09/30/19 2055 10/01/19 0613 10/01/19 1133  GLUCAP 119* 115* 122*   Slight elevated on 2/20 13.  ?  CKD stage II.               BUN/creatinine 7/0.6 on  2/18 14.  Restless leg syndrome.  Requip 2 mg 3 times daily--Initially Reduced to HS d/t lethargy  2/11 home requip resumed d/t improved arousal.  15. Unresponsive episode Tuesday afternoon   ?seizure   -keppra initiated after episode on 2/16   -EEG demonstrated generalized slowing, CT without changes  -avoid spasticity medications. Suspect episode was d/t low bp/tizanidine effects   LOS: 17 days A FACE TO FACE EVALUATION WAS PERFORMED  [name] [name] Lorie Phenix 10/01/2019, 2:29 PM

## 2019-10-02 ENCOUNTER — Inpatient Hospital Stay (HOSPITAL_COMMUNITY): Payer: Medicare Other | Admitting: Speech Pathology

## 2019-10-02 LAB — GLUCOSE, CAPILLARY
Glucose-Capillary: 141 mg/dL — ABNORMAL HIGH (ref 70–99)
Glucose-Capillary: 181 mg/dL — ABNORMAL HIGH (ref 70–99)
Glucose-Capillary: 140 mg/dL — ABNORMAL HIGH (ref 70–99)
Glucose-Capillary: 117 mg/dL — ABNORMAL HIGH (ref 70–99)

## 2019-10-02 MED ORDER — LOSARTAN POTASSIUM 50 MG PO TABS
75.0000 mg | ORAL_TABLET | Freq: Every day | ORAL | Status: DC
  Administered 2019-10-03 – 2019-10-07 (×5): 75 mg via ORAL
  Filled 2019-10-02 (×5): qty 2

## 2019-10-02 NOTE — Progress Notes (Signed)
Speech Language Pathology Daily Session Note  Patient Details  Name: [name] [name] MRN: [id number] Date of Birth: 09/05/1950  Today's Date: 10/02/2019 SLP Individual Time: [id number] SLP Individual Time Calculation (min): 31 min  Short Term Goals: Week 3: SLP Short Term Goal 1 (Week 3): Pt will sustain attention to tasks for 5 minute intervals with Mod A cues for redirection. SLP Short Term Goal 2 (Week 3): Pt will recall new and/or complex daily information with Max A verbal/visual cues for use of compensatory strategies. SLP Short Term Goal 3 (Week 3): Pt will demonstrate problem solving during basic tasks with Max A verbal/visual cues. SLP Short Term Goal 4 (Week 3): Patient will scan to her left field of envionrment during functional tasks with Mod A multimodal cues.  Skilled Therapeutic Interventions:  Pt was seen for skilled ST targeting cognitive goals.  Upon arrival, pt was in bed with wet incontinence brief which was changed by SLP and nursing.  Pt had not eaten lunch yet so today's therapy session focused on problem solving and attention goals in the context of her meal.  Following tray set up, pt was able to sustain her attention to feeding herself for ~15 minutes in a quiet environment with only one item presented to her at a time with min assist verbal cues.  Pt could problem solve cutting her meats with min assist and reported that she wanted to be able to do it herself when SLP offered to help further.  Pt was left in bed with bed alarm set and call bell within reach.  Continue per current plan of care.    Pain Pain Assessment Pain Scale: 0-10 Pain Score: 0-No pain  Therapy/Group: Individual Therapy  Sinthia Karabin, Selinda Orion 10/02/2019, 2:29 PM

## 2019-10-02 NOTE — Progress Notes (Signed)
[facility] PHYSICAL MEDICINE & REHABILITATION PROGRESS NOTE   Subjective/Complaints: Patient seen sitting up in bed this morning.  She states she slept well overnight.  She denies complaints.  ROS: Denies CP, SOB, N/V/D  Objective:   No results found. No results for input(s): WBC, HGB, HCT, PLT in the last 72 hours. Recent Labs    09/29/19 1059  NA 134*  K 3.4*  CL 100  CO2 23  GLUCOSE 133*  BUN 7*  CREATININE 0.60  CALCIUM 9.1    Intake/Output Summary (Last 24 hours) at 10/02/2019 0917 Last data filed at 10/02/2019 0730 Gross per 24 hour  Intake 480 ml  Output --  Net 480 ml     Physical Exam: Vital Signs Blood pressure (!) 177/63, pulse 83, temperature 98.2 F (36.8 C), temperature source Oral, resp. rate 17, height 5\' 4"  (1.626 m), weight 83.3 kg, SpO2 96 %. Constitutional: No distress . Vital signs reviewed. HENT: Normocephalic.  Atraumatic. Eyes: EOMI. No discharge. Cardiovascular: No JVD. Respiratory: Normal effort.  No stridor. GI: Non-distended. Skin: Warm and dry.  Intact. Psych: Normal mood.  Normal behavior. Musc: No edema in extremities.  No tenderness in extremities. Neurological: Alert and oriented, except for date of month LUE/LLE: 0/5 proximal to distal  Assessment/Plan: 1. Functional deficits secondary to bicortical infarcts, Right frontal SAH which require 3+ hours per day of interdisciplinary therapy in a comprehensive inpatient rehab setting.  Physiatrist is providing close team supervision and 24 hour management of active medical problems listed below.  Physiatrist and rehab team continue to assess barriers to discharge/monitor patient progress toward functional and medical goals  Care Tool:  Bathing  Bathing activity did not occur: Refused           Bathing assist       Upper Body Dressing/Undressing Upper body dressing   What is the patient wearing?: Pull over shirt    Upper body assist Assist Level: Maximal Assistance -  Patient 25 - 49%    Lower Body Dressing/Undressing Lower body dressing      What is the patient wearing?: Pants     Lower body assist Assist for lower body dressing: Total Assistance - Patient < 25%     Toileting Toileting    Toileting assist Assist for toileting: Dependent - Patient 0%     Transfers Chair/bed transfer  Transfers assist     Chair/bed transfer assist level: 2 Helpers     Locomotion Ambulation   Ambulation assist   Ambulation activity did not occur: Safety/medical concerns          Walk 10 feet activity   Assist  Walk 10 feet activity did not occur: Safety/medical concerns        Walk 50 feet activity   Assist Walk 50 feet with 2 turns activity did not occur: Safety/medical concerns         Walk 150 feet activity   Assist Walk 150 feet activity did not occur: Safety/medical concerns         Walk 10 feet on uneven surface  activity   Assist Walk 10 feet on uneven surfaces activity did not occur: Safety/medical concerns         Wheelchair     Assist Will patient use wheelchair at discharge?: No(LT goals not set )             Wheelchair 50 feet with 2 turns activity    Assist  Wheelchair 150 feet activity     Assist          Blood pressure (!) 177/63, pulse 83, temperature 98.2 F (36.8 C), temperature source Oral, resp. rate 17, height 5\' 4"  (1.626 m), weight 83.3 kg, SpO2 96 %.  Medical Problem List and Plan: 1.  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 Patient with bilateral cerebral and right occipital infarcts as well as pulmonary emboli with DVT.  -recent MRI revealed progression of bi-frontal infarcts as well as new mall infarcts right lateral occ lobe   -?venous source given PFO and lack of other obvious etiologies. IVCF placed as recommended by neurology, completed by IR on  09/23/2018 right IJ route  Unable to tolerate tizanidine.     Continue orthoses and range of motion    -botox as outpt  Continue CIR-plan for SNF given extension of CVA's and pt's increased anticipated care needs at home 2. DVT/anticoagulation/Hx of PE: Eliquis             -antiplatelet therapy: N/A 3. Pain Management: Lidoderm patch change as directed  -hydrocodone  D/ced due to ams  -sports cream  -low dose tramadol 25mg  q8 prn  -left RTC/OA shoulder: had left shoulder injection 2 mos ago apparently   -pain worsened by left HP, capsulitis   Consider another injection at some point when more medically stable   -continue shoulder ROM and support  Controlled on 2/21 4. Mood: Valium as needed for anxiety and depression             -antipsychotic agents: N/A  -Has expressed some anxiety/depression/distraction d/t husband's suicide. Has difficulty disengaging from thoughts at times.    -Await neuropsych input     -on lexapro 10mg  daily currently 5. Neuropsych: This patient is not fully capable of making decisions on her own behalf. 6. Skin/Wound Care: Routine skin checks 7. Fluids/Electrolytes/Nutrition: encourage PO  Potassium 3.4 on 2/18, continue supplementation-labs ordered for tomorrow  Sodium 134 on 2/18, labs ordered for tomorrow 8.  PFO.  Follow cardiology services.  TEE completed, suggesting PFO, await further Cards recs.. 9.  Hypertension.  Cardizem 120 mg daily, Lasix 20 mg daily  Cozaar 50 mg daily, increased to 75 on 2/22            Trending up on 2/21 10.  Hyperlipidemia: Continue Lipitor 11.  Diabetes mellitus with hyperglycemia.  Hemoglobin A1c 9.4.  SSI.  Patient on Glucophage 1000 mg twice daily prior to admission.  Resume as needed           CBG (last 3)  Recent Labs    10/01/19 1643 10/01/19 2109 10/02/19 0619  GLUCAP 130* 134* 141*   Relatively controlled on 2/21 13.  ?  CKD stage II.               BUN/creatinine 7/0.6 on 2/18 14.  Restless leg syndrome.   Requip 2 mg 3 times daily--Initially Reduced to HS d/t lethargy  2/11 home requip resumed d/t improved arousal.  15. Unresponsive episode 2/16   ?seizure   -keppra initiated after episode on 2/16   -EEG demonstrated generalized slowing, CT without changes  -avoid spasticity medications. Suspect episode was d/t low bp/tizanidine effects   No repeat seizures since initiation of Keppra  LOS: 18 days A FACE TO FACE EVALUATION WAS PERFORMED  Yassmin Binegar Lorie Phenix 10/02/2019, 9:17 AM

## 2019-10-03 ENCOUNTER — Inpatient Hospital Stay (HOSPITAL_COMMUNITY): Payer: Medicare Other

## 2019-10-03 ENCOUNTER — Inpatient Hospital Stay (HOSPITAL_COMMUNITY): Payer: Medicare Other | Admitting: Speech Pathology

## 2019-10-03 LAB — CBC
HCT: 39.5 % (ref 36.0–46.0)
Hemoglobin: 13.3 g/dL (ref 12.0–15.0)
MCH: 29.5 pg (ref 26.0–34.0)
MCHC: 33.7 g/dL (ref 30.0–36.0)
MCV: 87.6 fL (ref 80.0–100.0)
Platelets: 435 10*3/uL — ABNORMAL HIGH (ref 150–400)
RBC: 4.51 MIL/uL (ref 3.87–5.11)
RDW: 14.8 % (ref 11.5–15.5)
WBC: 11.1 10*3/uL — ABNORMAL HIGH (ref 4.0–10.5)
nRBC: 0 % (ref 0.0–0.2)

## 2019-10-03 LAB — GLUCOSE, CAPILLARY
Glucose-Capillary: 124 mg/dL — ABNORMAL HIGH (ref 70–99)
Glucose-Capillary: 134 mg/dL — ABNORMAL HIGH (ref 70–99)
Glucose-Capillary: 126 mg/dL — ABNORMAL HIGH (ref 70–99)
Glucose-Capillary: 138 mg/dL — ABNORMAL HIGH (ref 70–99)

## 2019-10-03 LAB — BASIC METABOLIC PANEL
Anion gap: 11 (ref 5–15)
BUN: 13 mg/dL (ref 8–23)
CO2: 25 mmol/L (ref 22–32)
Calcium: 9.8 mg/dL (ref 8.9–10.3)
Chloride: 99 mmol/L (ref 98–111)
Creatinine, Ser: 0.72 mg/dL (ref 0.44–1.00)
GFR calc Af Amer: >60 mL/min (ref >60)
GFR calc non Af Amer: >60 mL/min (ref >60)
Glucose, Bld: 129 mg/dL — ABNORMAL HIGH (ref 70–99)
Potassium: 3.4 mmol/L — ABNORMAL LOW (ref 3.5–5.1)
Sodium: 135 mmol/L (ref 135–145)

## 2019-10-03 MED ORDER — POTASSIUM CHLORIDE CRYS ER 20 MEQ PO TBCR
20.0000 meq | EXTENDED_RELEASE_TABLET | Freq: Two times a day (BID) | ORAL | Status: DC
  Administered 2019-10-03 – 2019-10-07 (×8): 20 meq via ORAL
  Filled 2019-10-03 (×8): qty 1

## 2019-10-03 NOTE — Progress Notes (Signed)
Physical Therapy Session Note  Patient Details  Name: [name] [name] MRN: [id number] Date of Birth: 08/03/1951  Today's Date: 10/03/2019 PT Individual Time: 0918-1000 PT Individual Time Calculation (min): 42 min   Short Term Goals: Week 3:  PT Short Term Goal 1 (Week 3): =LTGs due to ELOS  Skilled Therapeutic Interventions/Progress Updates:     Patient in bed upon PT arrival. Patient alert and agreeable to PT session. Patient indicated intermittent L shoulder pain with passive movement during session, RN made aware. PT provided repositioning, rest breaks, and distraction as pain interventions throughout session. PT donned B TED hose with patient in supine at beginning of session with total A.  Therapeutic Activity: Bed Mobility: Patient performed rolling R/L with mod-max A of 1-2 people. She was incontinent of bowl and required total A for peri-care and LB dressing to doff an incontinence brief and don a new incontinence brief and paper scrub pants while rolling in the bed. She performed supine to/from sit with mod A +2 in a flat bed with min use of bed rails. Provided verbal cues for bringing LEs off and pushing to sit up. She sat EOB at PT donned B tennis shoes with total A for time management and energy conservation. She required mod-min A for sitting balance with posterior R lean, provided multimodal cues for forward and L weight shift in sitting.  Transfers: Patient performed a slide board transfer bed>TIS w/c on slide decline with max A +2 and total A for board placement. Provided cues for hand placement, board placement, and head-hips relationship for proper technique and decreased assist with transfers. Patient with decreased L trunk control and max-total A for L LE management throughout transfer.   Patient in [place] w/c in the room, RN aware, at end of session with breaks locked, seat belt alarm set, and all needs within reach.    Therapy Documentation Precautions:   Precautions Precautions: Fall Precaution Comments: recent L humeral fx, no orders Restrictions Weight Bearing Restrictions: No    Therapy/Group: Individual Therapy  Lynden Carrithers L [name] PT, DPT  10/03/2019, 12:45 PM

## 2019-10-03 NOTE — Progress Notes (Signed)
Occupational Therapy Session Note  Patient Details  Name: [name] [name] MRN: [id number] Date of Birth: 23-Jun-1951  Today's Date: 10/03/2019 OT Individual Time: 1120-1200 OT Individual Time Calculation (min): 40 min    Short Term Goals: Week 3:  OT Short Term Goal 1 (Week 3): Pt will sit EOB/EOM for 5 min with no more than min A for static sitting balance OT Short Term Goal 2 (Week 3): Pt will locate grooming items at midline for improved visual scanning and no more than 1 VC OT Short Term Goal 3 (Week 3): Pt will initiate scooting hips across slide board with multimodal cuing and max A consistently  Skilled Therapeutic Interventions/Progress Updates:  Self-care retraining: Patient met seated in TIS WC in agreement with OT treatment session. Patient performed face washing seated at sink level with Min A and Max verbal/tactile cues for termination of task. Patient completed UB bathing/dressing with Max A and hand over hand with Max verbal/tactile cues for sequencing. Redness noted under R<L breast. Powder applied to maintain skin integrity and decrease risk of skin breakdown. Patient able to recal hemi-dressing techniques for donning affected UE first and doffing last.   Neuromuscular re-education: Passive stretching applied to LUE prior to performance of towel pushes completed with dominant RUE over affected LUE to facilitate soft tissue elongation and decrease risk of contractures. Multimodal cues provided to maintain upright seated position. Patient left seated in TIS WC with call bell within reach and chair alarm belt activated.    Therapy Documentation Precautions:  Precautions Precautions: Fall Precaution Comments: recent L humeral fx, no orders Restrictions Weight Bearing Restrictions: No Pain: Patient c/o pain in LUE with PROM.  Therapy/Group: Individual Therapy  [name] 10/03/2019, 1:10 PM

## 2019-10-03 NOTE — Progress Notes (Signed)
Pts O2 at 88-90% on room air. Pt denies SOB at this time. Pt placed on 2L O2, and O2 increased to 97%

## 2019-10-03 NOTE — Progress Notes (Signed)
Speech Language Pathology Daily Session Note  Patient Details  Name: ANAMARIA STATZ MRN: [id number] Date of Birth: [birth year]  Today's Date: 10/03/2019 SLP Individual Time: 0730-0800 SLP Individual Time Calculation (min): 30 min  Short Term Goals: Week 3: SLP Short Term Goal 1 (Week 3): Pt will sustain attention to tasks for 5 minute intervals with Mod A cues for redirection. SLP Short Term Goal 2 (Week 3): Pt will recall new and/or complex daily information with Max A verbal/visual cues for use of compensatory strategies. SLP Short Term Goal 3 (Week 3): Pt will demonstrate problem solving during basic tasks with Max A verbal/visual cues. SLP Short Term Goal 4 (Week 3): Patient will scan to her left field of envionrment during functional tasks with Mod A multimodal cues.  Skilled Therapeutic Interventions: Skilled treatment session focused on cognitive goals. SLP facilitated session by providing extra time and Mod verbal cues for patient to scan to the left field of environment to locate items on her breakfast tray. Mod A verbal cues were also needed for tray set-up and problem solving with self-feeding.  Patient demonstrated sustained attention to self-feeding for ~15 minutes with Min verbal cues required for redirection. Patient left upright in bed with alarm on and all needs within reach. Continue with current plan of care.      Pain No/Denies Pain   Therapy/Group: Individual Therapy  Jarret Torre 10/03/2019, 11:47 AM

## 2019-10-03 NOTE — Progress Notes (Signed)
Social Work Patient ID: [name] [name], female   DOB: 12-27-1950, 69 y.o.   MRN: [id number]    SW met with pt to discuss previous discussion with regard to placement. Pt confirms SNF is still the plan. Pt has not reviewed SNF list. SW provided SNF list ([email or web address]) to pt to review. [facility] Active. SW sent out SNF referral.   [name], MSW, [place] Office: [phone] Cell: [phone] Fax: [phone]

## 2019-10-03 NOTE — Progress Notes (Signed)
[facility] PHYSICAL MEDICINE & REHABILITATION PROGRESS NOTE   Subjective/Complaints: Sitting up in bed. Denies pain. Says she slept.   ROS: Patient denies fever, rash, sore throat, blurred vision, nausea, vomiting, diarrhea, cough, shortness of breath or chest pain, joint or back pain, headache, or mood change.    Objective:   No results found. Recent Labs    10/03/19 0537  WBC 11.1*  HGB 13.3  HCT 39.5  PLT 435*   Recent Labs    10/03/19 0537  NA 135  K 3.4*  CL 99  CO2 25  GLUCOSE 129*  BUN 13  CREATININE 0.72  CALCIUM 9.8    Intake/Output Summary (Last 24 hours) at 10/03/2019 1023 Last data filed at 10/02/2019 1700 Gross per 24 hour  Intake 30 ml  Output --  Net 30 ml     Physical Exam: Vital Signs Blood pressure (!) 158/60, pulse 79, temperature 97.8 F (36.6 C), temperature source Oral, resp. rate 18, height 5\' 4"  (1.626 m), weight 83.3 kg, SpO2 100 %. Constitutional: No distress . Vital signs reviewed. HEENT: EOMI, oral membranes moist Neck: supple Cardiovascular: RRR without murmur. No JVD    Respiratory: CTA Bilaterally without wheezes or rales. Normal effort    GI: BS +, non-tender, non-distended  Skin: Warm and dry.  Intact. Psych: flat Musc: No edema in extremities.  No tenderness in extremities. Neurological: Alert and oriented to place, month, reason. Limited insight otherwise, STM deficits LUE/LLE: 0/5 proximal to distal, significant left inattention. DTR's 3+ on left. Flexor upper, extensor pattern lower  Assessment/Plan: 1. Functional deficits secondary to bicortical infarcts, Right frontal SAH which require 3+ hours per day of interdisciplinary therapy in a comprehensive inpatient rehab setting.  Physiatrist is providing close team supervision and 24 hour management of active medical problems listed below.  Physiatrist and rehab team continue to assess barriers to discharge/monitor patient progress toward functional and medical  goals  Care Tool:  Bathing  Bathing activity did not occur: Refused           Bathing assist       Upper Body Dressing/Undressing Upper body dressing   What is the patient wearing?: Pull over shirt    Upper body assist Assist Level: Maximal Assistance - Patient 25 - 49%    Lower Body Dressing/Undressing Lower body dressing      What is the patient wearing?: Pants     Lower body assist Assist for lower body dressing: Total Assistance - Patient < 25%     Toileting Toileting    Toileting assist Assist for toileting: Dependent - Patient 0%     Transfers Chair/bed transfer  Transfers assist     Chair/bed transfer assist level: 2 Helpers     Locomotion Ambulation   Ambulation assist   Ambulation activity did not occur: Safety/medical concerns          Walk 10 feet activity   Assist  Walk 10 feet activity did not occur: Safety/medical concerns        Walk 50 feet activity   Assist Walk 50 feet with 2 turns activity did not occur: Safety/medical concerns         Walk 150 feet activity   Assist Walk 150 feet activity did not occur: Safety/medical concerns         Walk 10 feet on uneven surface  activity   Assist Walk 10 feet on uneven surfaces activity did not occur: Safety/medical concerns  Wheelchair     Assist Will patient use wheelchair at discharge?: No(LT goals not set )             Wheelchair 50 feet with 2 turns activity    Assist            Wheelchair 150 feet activity     Assist          Blood pressure (!) 158/60, pulse 79, temperature 97.8 F (36.6 C), temperature source Oral, resp. rate 18, height 5\' 4"  (1.626 m), weight 83.3 kg, SpO2 100 %.  Medical Problem List and Plan: 1.  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 Patient with bilateral cerebral and right occipital infarcts as well  as pulmonary emboli with DVT.  -recent MRI revealed progression of bi-frontal infarcts as well as new mall infarcts right lateral occ lobe   -?venous source given PFO and lack of other obvious etiologies. IVCF placed as recommended by neurology, completed by IR on 09/23/2018 right IJ route  Unable to tolerate tizanidine.     Continue orthoses and range of motion    -botox as outpt  SNF now pending given extension of CVA's and pt's increased anticipated care needs at home 2. DVT/anticoagulation/Hx of PE: Eliquis             -antiplatelet therapy: N/A 3. Pain Management: Lidoderm patch change as directed  -hydrocodone  D/ced due to ams  -sports cream  -low dose tramadol 25mg  q8 prn  -left RTC/OA shoulder: had left shoulder injection 2 mos ago apparently   -pain worsened by left HP, capsulitis   Consider another injection at some point when more medically stable   -continue shoulder ROM and support  Denies pain 2/22 4. Mood: Valium as needed for anxiety and depression             -antipsychotic agents: N/A  -Has expressed some anxiety/depression/distraction d/t husband's suicide. Has difficulty disengaging from thoughts at times.    -have requested neuropsych input    -on lexapro 10mg  daily currently 5. Neuropsych: This patient is not fully capable of making decisions on her own behalf. 6. Skin/Wound Care: Routine skin checks 7. Fluids/Electrolytes/Nutrition: encourage PO  Potassium 3.4 again today 2/22. Continue supp  Sodium 135 2/22 8.  PFO.  Follow cardiology services.  TEE completed, suggesting PFO, await further Cards recs.. 9.  Hypertension.  Cardizem 120 mg daily, Lasix 20 mg daily  Cozaar 50 mg daily, increased to 75 on 2/22            2/22 follow for pattern 10.  Hyperlipidemia: Continue Lipitor 11.  Diabetes mellitus with hyperglycemia.  Hemoglobin A1c 9.4.  SSI.  Patient on Glucophage 1000 mg twice daily prior to admission.  Resume as needed           CBG (last 3)  Recent  Labs    10/02/19 1654 10/02/19 2106 10/03/19 0608  GLUCAP 140* 117* 124*   Relatively controlled on 2/22 13.  ?  CKD stage II.               BUN/creatinine 13/0.72 14.  Restless leg syndrome.  Requip 2 mg 3 times daily--Initially Reduced to HS d/t lethargy  2/11 home requip resumed d/t improved arousal.  15. Unresponsive episode 2/16   ?seizure   -keppra initiated after episode on 2/16   -EEG demonstrated generalized slowing, CT without changes  -avoid spasticity medications. Suspect episode was d/t  low bp/tizanidine effects   No repeat seizures since initiation of Keppra--d/w neuro re: lower dosing  15. Leukocytosis: WBC's slowly creeping up.    -no signs of infection   -recheck Wednesday  LOS: 19 days A FACE TO FACE EVALUATION WAS PERFORMED  Meredith Staggers 10/03/2019, 10:23 AM

## 2019-10-03 NOTE — Plan of Care (Signed)
Goals downgraded to max A overall to reflect decline in function and impending SNF d/c  Transfer goal d/c 2/2 need for lift equipment   Problem: Sit to Stand Goal: LTG:  Patient will perform sit to stand in prep for activites of daily living with assistance level (OT) Description: LTG:  Patient will perform sit to stand in prep for activites of daily living with assistance level (OT) Outcome: Not Applicable Flowsheets (Taken 10/03/2019 0649) LTG: PT will perform sit to stand in prep for activites of daily living with assistance level: (goal d/c 2/22) --   Problem: RH Grooming Goal: LTG Patient will perform grooming w/assist,cues/equip (OT) Description: LTG: Patient will perform grooming with assist, with/without cues using equipment (OT) Flowsheets (Taken 10/03/2019 0649) LTG: Pt will perform grooming with assistance level of: (goal downgraded 2/22) Minimal Assistance - Patient > 75%   Problem: RH Bathing Goal: LTG Patient will bathe all body parts with assist levels (OT) Description: LTG: Patient will bathe all body parts with assist levels (OT) Flowsheets (Taken 10/03/2019 0649) LTG: Pt will perform bathing with assistance level/cueing: (goal downgraded 2/22) Maximal Assistance - Patient 25 - 49%   Problem: RH Functional Use of Upper Extremity Goal: LTG Patient will use RT/LT upper extremity as a (OT) Description: LTG: Patient will use right/left upper extremity as a stabilizer/gross assist/diminished/nondominant/dominant level with assist, with/without cues during functional activity (OT) Flowsheets (Taken 10/03/2019 0649) LTG: Use of upper extremity in functional activities: (goal downgraded 2/22) -- LTG: Pt will use upper extremity in functional activity with assistance level of: Maximal Assistance - Patient 25 - 49%   Problem: RH Dressing Goal: LTG Patient will perform lower body dressing w/assist (OT) Description: LTG: Patient will perform lower body dressing with assist,  with/without cues in positioning using equipment (OT) Flowsheets (Taken 10/03/2019 0649) LTG: Pt will perform lower body dressing with assistance level of: (goal downgraded 2/22) Total Assistance - Patient < 25%

## 2019-10-04 ENCOUNTER — Inpatient Hospital Stay (HOSPITAL_COMMUNITY): Payer: Medicare Other

## 2019-10-04 ENCOUNTER — Inpatient Hospital Stay (HOSPITAL_COMMUNITY): Payer: Medicare Other | Admitting: Physical Therapy

## 2019-10-04 ENCOUNTER — Inpatient Hospital Stay (HOSPITAL_COMMUNITY): Payer: Medicare Other | Admitting: Speech Pathology

## 2019-10-04 LAB — GLUCOSE, CAPILLARY
Glucose-Capillary: 116 mg/dL — ABNORMAL HIGH (ref 70–99)
Glucose-Capillary: 186 mg/dL — ABNORMAL HIGH (ref 70–99)
Glucose-Capillary: 108 mg/dL — ABNORMAL HIGH (ref 70–99)
Glucose-Capillary: 130 mg/dL — ABNORMAL HIGH (ref 70–99)

## 2019-10-04 NOTE — Progress Notes (Signed)
Occupational Therapy Session Note  Patient Details  Name: [name] [name] MRN: [id number] Date of Birth: Sep 22, 1950  Today's Date: 10/04/2019 OT Individual Time: 7373-6681 OT Individual Time Calculation (min): 25 min    Short Term Goals: Week 3:  OT Short Term Goal 1 (Week 3): Pt will sit EOB/EOM for 5 min with no more than min A for static sitting balance OT Short Term Goal 2 (Week 3): Pt will locate grooming items at midline for improved visual scanning and no more than 1 VC OT Short Term Goal 3 (Week 3): Pt will initiate scooting hips across slide board with multimodal cuing and max A consistently  Skilled Therapeutic Interventions/Progress Updates:    Pt received supine with no c/o pain. Pt incontinent of urine and bed wet. Pt completed rolling R with mod A and L with total A +2 for brief management and peri hygiene. Pants were donned max A +2 supine. Pt c/o pain in her L shoulder with tactile input/PROM- rest breaks provided as needed. Pt transitioned to EOB with max A +2 and required mod-max A for EOB sitting balance. Pt used stedy for sit <> stand with +2 max assist. Pt was left sitting up in the TIS w/c with chair alarm belt set. All needs met.   Therapy Documentation Precautions:  Precautions Precautions: Fall Precaution Comments: recent L humeral fx, no orders Restrictions Weight Bearing Restrictions: No   Therapy/Group: Individual Therapy  SHACARA COZINE 10/04/2019, 6:43 AM

## 2019-10-04 NOTE — Progress Notes (Signed)
Speech Language Pathology Daily Session Note  Patient Details  Name: [name] [name] MRN: [id number] Date of Birth: March 31, 1951  Today's Date: 10/04/2019 SLP Individual Time: 0825-0855 SLP Individual Time Calculation (min): 30 min  Short Term Goals: Week 3: SLP Short Term Goal 1 (Week 3): Pt will sustain attention to tasks for 5 minute intervals with Mod A cues for redirection. SLP Short Term Goal 2 (Week 3): Pt will recall new and/or complex daily information with Max A verbal/visual cues for use of compensatory strategies. SLP Short Term Goal 3 (Week 3): Pt will demonstrate problem solving during basic tasks with Max A verbal/visual cues. SLP Short Term Goal 4 (Week 3): Patient will scan to her left field of envionrment during functional tasks with Mod A multimodal cues.  Skilled Therapeutic Interventions: Skilled treatment session focused on cognitive goals. SLP facilitated session by providing extra time and Min verbal cues for patient to scan to the left field of environment to locate items on her breakfast tray. Min A verbal cues were also needed for problem solving with tray set-up and sustained attention to self-feeding for ~20 minutes. Patient left upright in bed with alarm on and all needs within reach. Continue with current plan of care.      Pain No/Denies Pain   Therapy/Group: Individual Therapy  [name] 10/04/2019, 10:13 AM

## 2019-10-04 NOTE — Progress Notes (Addendum)
Social Work Patient ID: [name] [name], female   DOB: April 03, 1951, 69 y.o.   MRN: [id number]   SW received phone call from pt son [name] [phone]) to discuss SNF preferred locations. Preference is 1) [facility], 2) [facility], and 3) [facility]. Pt states he spoke with Admissions- [name] at [facility] and informed there was bed availability. SW sent referral to SNF locations.   SW left message for [name] [place]/Admissions with [facility] [phone]) to discuss referral. SW waiting on follow-up.    [name], MSW, [place] Office: [phone] Cell: [phone] Fax: [phone]

## 2019-10-04 NOTE — Progress Notes (Signed)
[facility] PHYSICAL MEDICINE & REHABILITATION PROGRESS NOTE   Subjective/Complaints: Up in bed. No new complaints. Denies pain. Was restless last night.   ROS: Limited due to cognitive/behavioral    Objective:   No results found. Recent Labs    10/03/19 0537  WBC 11.1*  HGB 13.3  HCT 39.5  PLT 435*   Recent Labs    10/03/19 0537  NA 135  K 3.4*  CL 99  CO2 25  GLUCOSE 129*  BUN 13  CREATININE 0.72  CALCIUM 9.8   No intake or output data in the 24 hours ending 10/04/19 1117   Physical Exam: Vital Signs Blood pressure (!) 166/73, pulse 88, temperature 97.6 F (36.4 C), temperature source Oral, resp. rate 18, height 5\' 4"  (1.626 m), weight 83.3 kg, SpO2 98 %. Constitutional: No distress . Vital signs reviewed. HEENT: EOMI, oral membranes moist Neck: supple Cardiovascular: RRR without murmur. No JVD    Respiratory: CTA Bilaterally without wheezes or rales. Normal effort    GI: BS +, non-tender, non-distended  Skin: Warm and dry.  Intact. Psych: flat Musc: No edema in extremities.  No tenderness in extremities. Neurological: Alert and oriented to place, month, day, reason she's here. Limited insight otherwise, STM deficits ongoing LUE/LLE: 0/5 proximal to distal, significant left inattention. DTR's 3+ on left. Flexor upper, extensor pattern to tone---MAS 2/4  Assessment/Plan: 1. Functional deficits secondary to bicortical infarcts, Right frontal SAH which require 3+ hours per day of interdisciplinary therapy in a comprehensive inpatient rehab setting.  Physiatrist is providing close team supervision and 24 hour management of active medical problems listed below.  Physiatrist and rehab team continue to assess barriers to discharge/monitor patient progress toward functional and medical goals  Care Tool:  Bathing  Bathing activity did not occur: Refused Body parts bathed by patient: Left arm, Chest, Abdomen, Face   Body parts bathed by helper: Front perineal  area, Buttocks     Bathing assist Assist Level: 2 Helpers     Upper Body Dressing/Undressing Upper body dressing   What is the patient wearing?: Pull over shirt    Upper body assist Assist Level: Maximal Assistance - Patient 25 - 49%    Lower Body Dressing/Undressing Lower body dressing      What is the patient wearing?: Pants     Lower body assist Assist for lower body dressing: 2 Helpers     Toileting Toileting    Toileting assist Assist for toileting: Dependent - Patient 0%     Transfers Chair/bed transfer  Transfers assist     Chair/bed transfer assist level: 2 Helpers     Locomotion Ambulation   Ambulation assist   Ambulation activity did not occur: Safety/medical concerns          Walk 10 feet activity   Assist  Walk 10 feet activity did not occur: Safety/medical concerns        Walk 50 feet activity   Assist Walk 50 feet with 2 turns activity did not occur: Safety/medical concerns         Walk 150 feet activity   Assist Walk 150 feet activity did not occur: Safety/medical concerns         Walk 10 feet on uneven surface  activity   Assist Walk 10 feet on uneven surfaces activity did not occur: Safety/medical concerns         Wheelchair     Assist Will patient use wheelchair at discharge?: No(LT goals not set )  Wheelchair 50 feet with 2 turns activity    Assist            Wheelchair 150 feet activity     Assist          Blood pressure (!) 166/73, pulse 88, temperature 97.6 F (36.4 C), temperature source Oral, resp. rate 18, height 5\' 4"  (1.626 m), weight 83.3 kg, SpO2 98 %.  Medical Problem List and Plan: 1.  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 Patient with bilateral cerebral and right occipital infarcts as well as pulmonary emboli with DVT.  -recent MRI revealed progression of  bi-frontal infarcts as well as new mall infarcts right lateral occ lobe   -?venous source given PFO and lack of other obvious etiologies. IVCF placed as recommended by neurology, completed by IR on 09/23/2018 right IJ route  Unable to tolerate tizanidine.     Continue orthoses and range of motion    -botox as outpt  SNF  pending given extension of CVA's and pt's increased anticipated care needs at home 2. DVT/anticoagulation/Hx of PE: Eliquis             -antiplatelet therapy: N/A 3. Pain Management: Lidoderm patch change as directed  -hydrocodone  D/ced due to ams  -sports cream  -low dose tramadol 25mg  q8 prn  -left RTC/OA shoulder: had left shoulder injection 2 mos ago apparently   -pain worsened by left HP, capsulitis   Consider another injection at some point when more medically stable   -continue shoulder ROM and support  Denies pain 2/22 4. Mood: Valium as needed for anxiety and depression             -antipsychotic agents: N/A  -Has expressed some anxiety/depression/distraction d/t husband's suicide. Has difficulty disengaging from thoughts at times.    -have requested neuropsych input    -on lexapro 10mg  daily currently 5. Neuropsych: This patient is not fully capable of making decisions on her own behalf. 6. Skin/Wound Care: Routine skin checks 7. Fluids/Electrolytes/Nutrition: encourage PO  Potassium 3.4 again  2/22. Continue supp  Sodium 135 2/22 8.  PFO.  Follow cardiology services.  TEE completed, suggesting PFO, await further Cards recs.. 9.  Hypertension.  Cardizem 120 mg daily, Lasix 20 mg daily  Cozaar 50 mg daily, increased to 75 on 2/22            2/23 some improvement--continue to follow for now 10.  Hyperlipidemia: Continue Lipitor 11.  Diabetes mellitus with hyperglycemia.  Hemoglobin A1c 9.4.  SSI.  Patient on Glucophage 1000 mg twice daily prior to admission.  Resume as needed           CBG (last 3)  Recent Labs    10/03/19 1732 10/03/19 2115  10/04/19 0812  GLUCAP 126* 138* 116*   Relatively controlled on 2/23 13.  ?  CKD stage II.               BUN/creatinine 13/0.72 14.  Restless leg syndrome.  Requip 2 mg 3 times daily--Initially Reduced to HS d/t lethargy  2/11 home requip resumed d/t improved arousal.  15. Unresponsive episode 2/16   ?seizure   -keppra initiated after episode on 2/16   -EEG demonstrated generalized slowing, CT without changes  -avoid spasticity medications. Suspect episode was d/t low bp/tizanidine effects   No repeat seizures since initiation of Keppra--d/w neuro re: lower dosing  15. Leukocytosis: WBC's slowly creeping up.    -  no signs of infection   -recheck 2/24  LOS: 20 days A FACE TO [place] 10/04/2019, 11:17 AM

## 2019-10-04 NOTE — Progress Notes (Signed)
Physical Therapy Session Note  Patient Details  Name: [name] [name] MRN: [id number] Date of Birth: December 03, 1950  Today's Date: 10/04/2019 PT Individual Time: [id number] AND 1345-1408 PT Individual Time Calculation (min): 24 min AND 23 min  Short Term Goals: Week 3:  PT Short Term Goal 1 (Week 3): =LTGs due to ELOS  Skilled Therapeutic Interventions/Progress Updates:   Session 1:  Pt in TIS and agreeable to therapy, reports pain in L shoulder but decreases w/ propping and repositioning. Total assist w/c transport to/from therapy gym. Worked on reciprocal movement pattern and BLE strengthening on kinetron. Performed 3 min x3 @ level 80 cm/sec w/ min-mod assist from therapist to facilitate smooth and fluid pattern. Pt actively engaging and attending to task well w/ only min verbal cues throughout. Total assist w/c transport back to room. Total +2 to reposition and scoot back all the way in the w/c. Ended session tilted in TIS, all needs in reach.   Session 2:  Pt in TIS and agreeable to therapy, denies pain. Total assist w/c transport to/from therapy gym. Once in gym, therapist assisting pt w/ scooting back in chair when pt noted to be incontinent. Returned to room and performed slide board transfer to EOB w/ total assist +2. Attempted to have pt assist w/ scooting, however unable to sequence and problem solve when transferring towards R side. Dependent sit>supine and R/L rolling while 2nd helper performed pericare and brief management. Pt does initiate rolling w/ tactile, verbal, and manual cues for technique, UE/LE placement, and to turn head in direction she is rolling. Ended session in supine, all needs in reach.   Therapy Documentation Precautions:  Precautions Precautions: Fall Precaution Comments: recent L humeral fx, no orders Restrictions Weight Bearing Restrictions: No Vital Signs: Therapy Vitals Pulse Rate: 88 BP: (!) 166/73  Therapy/Group: Individual Therapy  [name] Clent Demark 10/04/2019, 12:10 PM

## 2019-10-04 NOTE — Patient Care Conference (Signed)
Inpatient RehabilitationTeam Conference and Plan of Care Update Date: 10/04/2019   Time: 10:00 AM   Patient Name: [name] [name]      Medical Record Number: [id number]  Date of Birth: [birth year] Sex: Female         Room/Bed: 4W04C/4W04C-01 Payor Info: Payor: MEDICARE / Plan: MEDICARE PART A AND B / Product Type: *No Product type* /    Admit Date/Time:  09/14/2019  4:51 PM  Primary Diagnosis:  Acute bilat watershed infarction [facility])  Patient Active Problem List   Diagnosis Date Noted  . Labile blood pressure   . Hypokalemia   . Seizures ([place])   . Spastic hemiparesis of right dominant side ([place])   . Acute bilat watershed infarction [facility]) 09/14/2019  . CKD (chronic kidney disease), stage II   . Uncontrolled type 2 diabetes mellitus with hyperglycemia ([place])   . PFO (patent foramen ovale)   . Acute CVA (cerebrovascular accident) ([place]) 09/11/2019  . Subarachnoid hemorrhage ([place]) 09/11/2019  . DVT (deep venous thrombosis) ([place]) 09/11/2019  . Aneurysm, thoracic aortic ([place])   . Embolic stroke ([place])   . Pulmonary embolism ([place])   . Acute cerebrovascular accident (CVA) ([place]) 08/18/2019  . COPD (chronic obstructive pulmonary disease) ([place])   . COPD exacerbation ([place]) 08/12/2018  . Hyponatremia 08/12/2018  . Acute respiratory failure with hypoxia ([place]) 10/26/2015  . CAP (community acquired pneumonia) 10/26/2015  . Type 2 diabetes mellitus ([place]) 10/26/2015  . Overactive bladder 09/10/2015  . Diabetes mellitus type 2, controlled, without complications ([place])   . Depression   . Insomnia   . GERD (gastroesophageal reflux disease)   . Hypercholesteremia   . Hypertension   . Chronic kidney disease   . Overweight 10/29/2009  . GERD 06/20/2008  . CHEST PAIN 06/20/2008  . CHOLECYSTECTOMY, HX OF 06/20/2008  . HYPERLIPIDEMIA 12/11/2007  . TOBACCO ABUSE 12/11/2007  . DEPRESSION 12/11/2007  . Essential hypertension 12/11/2007  . Coronary atherosclerosis 12/11/2007  . DUODENITIS, WITH  HEMORRHAGE 12/11/2007    Expected Discharge Date: Expected Discharge Date: (Plan is for SNF at [place])  Team Members Present: Physician leading conference: Dr. Alger Simons Social Worker Present: [name], LCSW) Nurse Present: Other (comment)(Inka Jamison Oka, LPN) Case Manager: Karene Fry, RN PT Present: Burnard Bunting, PT OT Present: Laverle Hobby, OT SLP Present: Weston Anna, SLP PPS Coordinator present : Gunnar Fusi, SLP     Current Status/Progress Goal Weekly Team Focus  Bowel/Bladder   pt is incontinent of B/B, LBM 10/03/19  Pt will regain control of B/B with normal bowel pattern  timed toileting/PRN   Swallow/Nutrition/ Hydration             ADL's   Mod-max A UB ADLs, max +2 LB ADLs, hoyer + stedy +2 transfers, L hemi with shoulder pain  Max A overall, transfer goals d/c  L NMR, motor planning, ADL retraining, cognition/sequencing   Mobility   max-total +2 for all mobility, slide board transfers, poor midline orientation/postural control  mod-max assist overall, w/c level  motor planning, L attention/awareness, NMR and postural control, midline orientation   Communication             Safety/Cognition/ Behavioral Observations  Max-Total A  Max A  attention, initaition, orientatin, visual scanning   Pain   Pt denies being in pain at this time. Pt c/o of shoudler pain on previous shift- PRN tylenol given.  Pt pain will be below 3  assess pain qshift/ PRN   Skin   MASD to groin  Pt will remain free of infection and further breakdown.  Assess skin qshift/prn    Rehab Goals Patient on target to meet rehab goals: Yes *See Care Plan and progress notes for long and short-term goals.     Barriers to Discharge  Current Status/Progress Possible Resolutions Date Resolved   Nursing                  PT                    OT                  SLP                SW                Discharge Planning/Teaching Needs:  Plan is to d/c to SNF. Referral sent out  Family  education as recommended by therapy   Team Discussion: Had an unresponsive episode, on keppra, has increased tone.  RN BS 116, MASD groin/buttocks, inc B/B.  OT max UB ADL, tot +2 LB ADL, max +2 steady or maximove, goals are max A.  PT +2, max cues, mod/max midline control, goals mod/max.  SLP max/total, confused, not making gains.  Has several bed offers; has son who will chose SNF placement.   Revisions to Treatment Plan: N/A     Medical Summary Current Status: ?seizures vs response to tizanidine (low bp). no events since last week. spastic left hemiparesis. more alert Weekly Focus/Goal: tone mgt, bp control, maximize arousal  Barriers to Discharge: Medical stability   Possible Resolutions to Barriers: see medical progress notes   Continued Need for Acute Rehabilitation Level of Care: The patient requires daily medical management by a physician with specialized training in physical medicine and rehabilitation for the following reasons: Direction of a multidisciplinary physical rehabilitation program to maximize functional independence : Yes Medical management of patient stability for increased activity during participation in an intensive rehabilitation regime.: Yes Analysis of laboratory values and/or radiology reports with any subsequent need for medication adjustment and/or medical intervention. : Yes   I attest that I was present, lead the team conference, and concur with the assessment and plan of the team.   [name] 10/04/2019, 5:22 PM   Team conference was held via web/ teleconference due to [place] - 19

## 2019-10-04 NOTE — Plan of Care (Signed)
  Problem: Consults Goal: RH STROKE PATIENT EDUCATION Description: See Patient Education module for education specifics  Outcome: Progressing Goal: Diabetes Guidelines if Diabetic/Glucose > 140 Description: If diabetic or lab glucose is > 140 mg/dl - Initiate Diabetes/Hyperglycemia Guidelines & Document Interventions  Outcome: Progressing   Problem: RH BLADDER ELIMINATION Goal: RH STG MANAGE BLADDER WITH ASSISTANCE Description: STG Manage Bladder With min Assistance Outcome: Progressing   Problem: RH SKIN INTEGRITY Goal: RH STG MAINTAIN SKIN INTEGRITY WITH ASSISTANCE Description: STG Maintain Skin Integrity With min Assistance. Outcome: Progressing Goal: RH STG ABLE TO PERFORM INCISION/WOUND CARE W/ASSISTANCE Description: STG Able To Perform Incision/Wound Care With min Assistance. Outcome: Progressing   Problem: RH SAFETY Goal: RH STG ADHERE TO SAFETY PRECAUTIONS W/ASSISTANCE/DEVICE Description: STG Adhere to Safety Precautions With min Assistance and appropriate assistive Device. Outcome: Progressing   Problem: RH PAIN MANAGEMENT Goal: RH STG PAIN MANAGED AT OR BELOW PT'S PAIN GOAL Description: <3 on a 0-10 pain scale Outcome: Progressing   Problem: RH KNOWLEDGE DEFICIT Goal: RH STG INCREASE KNOWLEDGE OF DIABETES Description: Patient will demonstrate knowledge of diabetes medications, dietary restrictions, and follow up care with the MD at discharge with min assist from the staff. Outcome: Progressing Goal: RH STG INCREASE KNOWLEDGE OF HYPERTENSION Description: Patient will demonstrate knowledge of HTN medications, dietary restrictions, and follow up care with the MD at discharge with min assist from the staff.  Outcome: Progressing Goal: RH STG INCREASE KNOWLEDGE OF DYSPHAGIA/FLUID INTAKE Description: Patient will demonstrate knowledge of dysphagia diets, restrictions, and proper swallowing techniques with min assist from staff. Outcome: Progressing Goal: RH STG INCREASE  KNOWLEGDE OF HYPERLIPIDEMIA Description: Patient will demonstrate knowledge of HLD medications, dietary restrictions, and follow up care with the MD at discharge with min assist from the staff.  Outcome: Progressing Goal: RH STG INCREASE KNOWLEDGE OF STROKE PROPHYLAXIS Description: Patient will demonstrate knowledge of stroke prevention medication and follow up care with the MD at discharge with min assist from staff. Outcome: Progressing   Problem: RH BOWEL ELIMINATION Goal: RH STG MANAGE BOWEL W/MEDICATION W/ASSISTANCE Description: STG Manage Bowel with Medication with Assistance. Outcome: Progressing

## 2019-10-05 ENCOUNTER — Inpatient Hospital Stay (HOSPITAL_COMMUNITY): Payer: Medicare Other | Admitting: Physical Therapy

## 2019-10-05 ENCOUNTER — Inpatient Hospital Stay (HOSPITAL_COMMUNITY): Payer: Medicare Other | Admitting: Speech Pathology

## 2019-10-05 LAB — CBC
HCT: 40.8 % (ref 36.0–46.0)
Hemoglobin: 13.9 g/dL (ref 12.0–15.0)
MCH: 29.6 pg (ref 26.0–34.0)
MCHC: 34.1 g/dL (ref 30.0–36.0)
MCV: 86.8 fL (ref 80.0–100.0)
Platelets: 405 10*3/uL — ABNORMAL HIGH (ref 150–400)
RBC: 4.7 MIL/uL (ref 3.87–5.11)
RDW: 15 % (ref 11.5–15.5)
WBC: 10.7 10*3/uL — ABNORMAL HIGH (ref 4.0–10.5)
nRBC: 0 % (ref 0.0–0.2)

## 2019-10-05 LAB — GLUCOSE, CAPILLARY
Glucose-Capillary: 136 mg/dL — ABNORMAL HIGH (ref 70–99)
Glucose-Capillary: 129 mg/dL — ABNORMAL HIGH (ref 70–99)
Glucose-Capillary: 124 mg/dL — ABNORMAL HIGH (ref 70–99)
Glucose-Capillary: 154 mg/dL — ABNORMAL HIGH (ref 70–99)

## 2019-10-05 MED ORDER — NON FORMULARY: 3.0000 mg | Freq: Every evening | Status: DC | PRN

## 2019-10-05 MED ORDER — MELATONIN 3 MG PO TABS
3.0000 mg | ORAL_TABLET | Freq: Every evening | ORAL | Status: DC | PRN
  Administered 2019-10-06: 3 mg via ORAL
  Filled 2019-10-05 (×2): qty 1

## 2019-10-05 NOTE — Progress Notes (Signed)
[facility] PHYSICAL MEDICINE & REHABILITATION PROGRESS NOTE   Subjective/Complaints: [name] has no complaints this morning. She says that she has no pain and is moving her bowels regularly. Slept poorly last night.  Hgb and PC both slightly decreased.   ROS: Limited due to cognitive/behavioral    Objective:   No results found. Recent Labs    10/03/19 0537 10/05/19 0705  WBC 11.1* 10.7*  HGB 13.3 13.9  HCT 39.5 40.8  PLT 435* 405*   Recent Labs    10/03/19 0537  NA 135  K 3.4*  CL 99  CO2 25  GLUCOSE 129*  BUN 13  CREATININE 0.72  CALCIUM 9.8    Intake/Output Summary (Last 24 hours) at 10/05/2019 0931 Last data filed at 10/05/2019 0814 Gross per 24 hour  Intake 480 ml  Output --  Net 480 ml     Physical Exam: Vital Signs Blood pressure 108/87, pulse 77, temperature 97.8 F (36.6 C), temperature source Oral, resp. rate 18, height 5\' 4"  (1.626 m), weight 83.3 kg, SpO2 98 %. Constitutional: No distress . Vital signs reviewed. Lying in bed. Appears comfortable.  HEENT: EOMI, oral membranes moist Neck: supple Cardiovascular: RRR without murmur. No JVD    Respiratory: CTA Bilaterally without wheezes or rales. Normal effort    GI: BS +, non-tender, non-distended  Skin: Warm and dry.  Intact. Psych: flat Musc: No edema in extremities.  No tenderness in extremities. Neurological: Alert and oriented to place, month, day, reason she's here. Limited insight otherwise, STM deficits ongoing. Restless leg syndrome is well controlled.  LUE/LLE: 0/5 proximal to distal, significant left inattention. DTR's 3+ on left. Flexor upper, extensor pattern to tone---MAS 2/4  Assessment/Plan: 1. Functional deficits secondary to bicortical infarcts, Right frontal SAH which require 3+ hours per day of interdisciplinary therapy in a comprehensive inpatient rehab setting.  Physiatrist is providing close team supervision and 24 hour management of active medical problems listed  below.  Physiatrist and rehab team continue to assess barriers to discharge/monitor patient progress toward functional and medical goals  Care Tool:  Bathing  Bathing activity did not occur: Refused Body parts bathed by patient: Left arm, Chest, Abdomen, Face   Body parts bathed by helper: Front perineal area, Buttocks     Bathing assist Assist Level: 2 Helpers     Upper Body Dressing/Undressing Upper body dressing   What is the patient wearing?: Pull over shirt    Upper body assist Assist Level: Maximal Assistance - Patient 25 - 49%    Lower Body Dressing/Undressing Lower body dressing      What is the patient wearing?: Pants     Lower body assist Assist for lower body dressing: 2 Helpers     Toileting Toileting    Toileting assist Assist for toileting: Dependent - Patient 0%     Transfers Chair/bed transfer  Transfers assist     Chair/bed transfer assist level: 2 Helpers     Locomotion Ambulation   Ambulation assist   Ambulation activity did not occur: Safety/medical concerns          Walk 10 feet activity   Assist  Walk 10 feet activity did not occur: Safety/medical concerns        Walk 50 feet activity   Assist Walk 50 feet with 2 turns activity did not occur: Safety/medical concerns         Walk 150 feet activity   Assist Walk 150 feet activity did not occur: Safety/medical concerns  Walk 10 feet on uneven surface  activity   Assist Walk 10 feet on uneven surfaces activity did not occur: Safety/medical concerns         Wheelchair     Assist Will patient use wheelchair at discharge?: No(LT goals not set )             Wheelchair 50 feet with 2 turns activity    Assist            Wheelchair 150 feet activity     Assist          Blood pressure 108/87, pulse 77, temperature 97.8 F (36.6 C), temperature source Oral, resp. rate 18, height 5\' 4"  (1.626 m), weight 83.3 kg, SpO2 98  %.  Medical Problem List and Plan: 1.  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 Patient with bilateral cerebral and right occipital infarcts as well as pulmonary emboli with DVT.  -recent MRI revealed progression of bi-frontal infarcts as well as new mall infarcts right lateral occ lobe   -?venous source given PFO and lack of other obvious etiologies. IVCF placed as recommended by neurology, completed by IR on 09/23/2018 right IJ route  Unable to tolerate tizanidine.     Continue orthoses and range of motion    -botox as outpt  SNF pending given extension of CVA's and pt's increased anticipated care needs at home 2. DVT/anticoagulation/Hx of PE: Eliquis             -antiplatelet therapy: N/A 3. Pain Management: Lidoderm patch change as directed  -hydrocodone  D/ced due to ams  -sports cream  -low dose tramadol 25mg  q8 prn  -left RTC/OA shoulder: had left shoulder injection 2 mos ago apparently   -pain worsened by left HP, capsulitis   Consider another injection at some point when more medically stable   -continue shoulder ROM and support  Denies pain 2/22, 2/24 4. Mood: Valium as needed for anxiety and depression             -antipsychotic agents: N/A  -Has expressed some anxiety/depression/distraction d/t husband's suicide. Has difficulty disengaging from thoughts at times.    -have requested neuropsych input    -on lexapro 10mg  daily currently 5. Neuropsych: This patient is not fully capable of making decisions on her own behalf. 6. Skin/Wound Care: Routine skin checks 7. Fluids/Electrolytes/Nutrition: encourage PO  Potassium 3.4 again  2/22. Continue supp  Sodium 135 2/22 8.  PFO.  Follow cardiology services.  TEE completed, suggesting PFO, await further Cards recs.. 9.  Hypertension.  Cardizem 120 mg daily, Lasix 20 mg daily  Cozaar 50 mg daily, increased to 75 on 2/22            2/23 some  improvement--continue to follow for now  2/24:  flowsheet reviewed. Has been quite labile with range from SBP 106 to 166. Will continue to monitor.  10.  Hyperlipidemia: Continue Lipitor 11.  Diabetes mellitus with hyperglycemia.  Hemoglobin A1c 9.4.  SSI.  Patient on Glucophage 1000 mg twice daily prior to admission.  Resume as needed           CBG (last 3)  Recent Labs    10/04/19 1653 10/04/19 2111 10/05/19 0618  GLUCAP 108* 130* 136*   Relatively controlled on 2/23, 2/24.  13.  ?  CKD stage II.               BUN/creatinine 13/0.72 14.  Restless leg syndrome.  Requip 2 mg 3 times daily--Initially Reduced to HS d/t lethargy  2/11 home requip resumed d/t improved arousal.  15. Unresponsive episode 2/16   ?seizure   -keppra initiated after episode on 2/16   -EEG demonstrated generalized slowing, CT without changes  -avoid spasticity medications. Suspect episode was d/t low bp/tizanidine effects   No repeat seizures since initiation of Keppra--d/w neuro re: lower dosing  15. Leukocytosis: WBC's slowly creeping up.    -no signs of infection   -recheck 2/24 16. Insomnia: 2/24: Will add melatonin HS prn.   LOS: 21 days A FACE TO FACE EVALUATION WAS PERFORMED  Izora Ribas 10/05/2019, 9:31 AM

## 2019-10-05 NOTE — Progress Notes (Signed)
Physical Therapy Session Note  Patient Details  Name: [name] [name] MRN: [id number] Date of Birth: Aug 15, 1950  Today's Date: 10/05/2019 PT Individual Time: 1125-1155 PT Individual Time Calculation (min): 30 min   Short Term Goals: Week 3:  PT Short Term Goal 1 (Week 3): =LTGs due to ELOS  Skilled Therapeutic Interventions/Progress Updates:   Pt in supine and noted to be incontinent of urine. Pt agreeable to therapy and denies pain. R/L rolling w/ max assist, verbal and tactile cues for technique and RUE placement. Improved ability to initiate and produce enough force to roll w/ assist today. 2nd helper performed pericare and brief management. Donned new pants in supine, pt able to thread RLE w/o assist. Supine>sit w/ total assist +2. Min-mod static sitting balance prior to stedy +2 transfer to TIS. Verbal and tactile cues for RUE placement on stedy and pt able to boost into standing w/ min assist +2 today. However max assist for midline postural control. Total assist w/c transport to/from therapy gym. Performed sit>stand x2 in stedy. Mirror for visual feedback of midline orientation and max-total assist +1 sit>stand and to maintain standing. Pt w/ increased slowness of movement and reported feeling tired. Returned to room and ended session tilted in TIS, all needs in reach.   Therapy Documentation Precautions:  Precautions Precautions: Fall Precaution Comments: recent L humeral fx, no orders Restrictions Weight Bearing Restrictions: No  Therapy/Group: Individual Therapy  Isao Seltzer Clent Demark 10/05/2019, 12:46 PM

## 2019-10-05 NOTE — Progress Notes (Signed)
Occupational Therapy Session Note  Patient Details  Name: [name] [name] MRN: [id number] Date of Birth: [birth year]  Today's Date: 10/05/2019 OT Individual Time: 2248-2500 OT Individual Time Calculation (min): 30 min    Short Term Goals: Week 3:  OT Short Term Goal 1 (Week 3): Pt will sit EOB/EOM for 5 min with no more than min A for static sitting balance OT Short Term Goal 2 (Week 3): Pt will locate grooming items at midline for improved visual scanning and no more than 1 VC OT Short Term Goal 3 (Week 3): Pt will initiate scooting hips across slide board with multimodal cuing and max A consistently  Skilled Therapeutic Interventions/Progress Updates:    Pt received supine with no c/o pain. Pt incontinent of BM. Pt rolled to the R with min A for total A peri care and brief change. Pt able to don pants with max +2 assist with pt requiring HOH asistance to find pants to pull up in supine. Pt transitioned into sidelying and then into sitting EOB with HOB raised and max +2 assist. Max A overall required for static sitting balance EOB. Pt doffed shirt with max A, requiring multimodal cueing to initiate/sequence. Pt completed UB bathing with min A. Max A to don shirt with good recall of hemi technique. Pt demonstrated improvement with task termination this session. Pt returned to supine with all needs met, RN present.   Therapy Documentation Precautions:  Precautions Precautions: Fall Precaution Comments: recent L humeral fx, no orders Restrictions Weight Bearing Restrictions: No  Therapy/Group: Individual Therapy  [name] 10/05/2019, 6:53 AM

## 2019-10-05 NOTE — Progress Notes (Signed)
Social Work Patient ID: [name] [name], female   DOB: Mar 18, 1951, 69 y.o.   MRN: [id number]    Referral declined by [facility] via [place].   SW spoke with [facility] liaison [phone]) accepting referral. Able to accept pt at anytime. States pt will need COVID test prior to admission. SW informed will speak with pt son [name] [phone]) on above. Pt son states he spoke with Admissions at [facility] and was informed referral was not received. Son is insistent on pt going to [facility]. He intends to follow-up with facility. SW left message for [name] Manus/Admissions with [facility] [phone]) to discuss referral. SW waiting on follow-up.   [name], MSW, [place] Office: [phone] Cell: [phone] Fax: [phone]

## 2019-10-05 NOTE — Progress Notes (Signed)
Speech Language Pathology Daily Session Note  Patient Details  Name: [name] [name] MRN: [id number] Date of Birth: 1950-09-30  Today's Date: 10/05/2019 SLP Individual Time: 0930-1010 SLP Individual Time Calculation (min): 40 min  Short Term Goals: Week 3: SLP Short Term Goal 1 (Week 3): Pt will sustain attention to tasks for 5 minute intervals with Mod A cues for redirection. SLP Short Term Goal 2 (Week 3): Pt will recall new and/or complex daily information with Max A verbal/visual cues for use of compensatory strategies. SLP Short Term Goal 3 (Week 3): Pt will demonstrate problem solving during basic tasks with Max A verbal/visual cues. SLP Short Term Goal 4 (Week 3): Patient will scan to her left field of envionrment during functional tasks with Mod A multimodal cues.  Skilled Therapeutic Interventions: Skilled treatment session focused on cognitive goals. SLP facilitated session by providing overall Mod A verbal cues for scanning to the left field of environment, initiation and problem solving during a basic problem solving task. Patient demonstrated sustained attention to task for ~10 minutes with overall Min A verbal cues, however, as session progressed Max A multimodal cues were needed for attention and initiation. Patient utilized a calendar to recall the date with Mod verbal and visual cues and independently recalled the another physician came to see her this morning. Patient left upright in bed with alarm on and all needs within reach. Continue with current plan of care.      Pain No/Denies Pain   Therapy/Group: Individual Therapy  Jasmynn Pfalzgraf 10/05/2019, 11:05 AM

## 2019-10-06 ENCOUNTER — Inpatient Hospital Stay (HOSPITAL_COMMUNITY): Payer: Medicare Other | Admitting: Physical Therapy

## 2019-10-06 ENCOUNTER — Inpatient Hospital Stay (HOSPITAL_COMMUNITY): Payer: Medicare Other

## 2019-10-06 ENCOUNTER — Inpatient Hospital Stay (HOSPITAL_COMMUNITY): Payer: Medicare Other | Admitting: Speech Pathology

## 2019-10-06 LAB — GLUCOSE, CAPILLARY
Glucose-Capillary: 169 mg/dL — ABNORMAL HIGH (ref 70–99)
Glucose-Capillary: 146 mg/dL — ABNORMAL HIGH (ref 70–99)
Glucose-Capillary: 176 mg/dL — ABNORMAL HIGH (ref 70–99)
Glucose-Capillary: 86 mg/dL (ref 70–99)

## 2019-10-06 LAB — SARS CORONAVIRUS 2 (TAT 6-24 HRS): SARS Coronavirus 2: NEGATIVE

## 2019-10-06 MED ORDER — METFORMIN HCL 500 MG PO TABS: 500.0000 mg | ORAL_TABLET | Freq: Two times a day (BID) | ORAL | Status: AC

## 2019-10-06 MED ORDER — DIAZEPAM 2 MG PO TABS: 2.0000 mg | ORAL_TABLET | Freq: Every day | ORAL | 0 refills | Status: DC | PRN

## 2019-10-06 MED ORDER — FUROSEMIDE 20 MG PO TABS: 20.0000 mg | ORAL_TABLET | Freq: Every day | ORAL | Status: AC

## 2019-10-06 MED ORDER — LOSARTAN POTASSIUM 25 MG PO TABS: 75.0000 mg | ORAL_TABLET | Freq: Every day | ORAL | Status: AC

## 2019-10-06 MED ORDER — MELATONIN 3 MG PO TABS: 3.0000 mg | ORAL_TABLET | Freq: Every day | ORAL | 0 refills | Status: AC

## 2019-10-06 MED ORDER — LIDOCAINE 5 % EX PTCH: 1.0000 | MEDICATED_PATCH | CUTANEOUS | 0 refills | Status: AC

## 2019-10-06 MED ORDER — ACETAMINOPHEN 325 MG PO TABS: 650.0000 mg | ORAL_TABLET | ORAL | Status: AC | PRN

## 2019-10-06 MED ORDER — LEVETIRACETAM 1000 MG PO TABS: 1000.0000 mg | ORAL_TABLET | Freq: Two times a day (BID) | ORAL | Status: AC

## 2019-10-06 MED ORDER — TRAMADOL HCL 50 MG PO TABS: 25.0000 mg | ORAL_TABLET | Freq: Two times a day (BID) | ORAL | 0 refills | Status: AC | PRN

## 2019-10-06 MED ORDER — MELATONIN 3 MG PO TABS
3.0000 mg | ORAL_TABLET | Freq: Every day | ORAL | Status: DC
  Administered 2019-10-06: 3 mg via ORAL
  Filled 2019-10-06 (×2): qty 1

## 2019-10-06 NOTE — Progress Notes (Signed)
Occupational Therapy Session Note  Patient Details  Name: [name] [name] MRN: [id number] Date of Birth: 20-Jul-1951  Today's Date: 10/06/2019 OT Individual Time: 1515-1600 OT Individual Time Calculation (min): 45 min    Short Term Goals: Week 1:  OT Short Term Goal 1 (Week 1): Pt will complete sit<>stand with consistent mod A in preparation for BADL tasks OT Short Term Goal 1 - Progress (Week 1): Not met OT Short Term Goal 2 (Week 1): Pt will locate 2 grooming items on L side of the sink with min questioing cues OT Short Term Goal 2 - Progress (Week 1): Met OT Short Term Goal 3 (Week 1): Pt will complete 1 step of UB dressing task without verbal cues OT Short Term Goal 3 - Progress (Week 1): Progressing toward goal  Skilled Therapeutic Interventions/Progress Updates:    1:1. Pt received in bed agreeable to tx with pain in BLE and RN applies muscle rub to LEs. Pt rolls in B directions with total +2 A with no command following this date for dependent peri care and breif change. Pt completes SBT with total +2 A with manual facilitation of weight shift and hand placement EOB<>TIS. Pt sits EOM for 5 min with brief periods of supervision up to total A with max VC for using head movements to begin weight shifting anterior/posteriorly. Exited session with pt seated in bed, exit alarm on and call light in reach  Therapy Documentation Precautions:  Precautions Precautions: Fall Precaution Comments: recent L humeral fx, no orders Restrictions Weight Bearing Restrictions: No General:   Vital Signs: Therapy Vitals Temp: 98.1 F (36.7 C) Temp Source: Oral Pulse Rate: 96 Resp: 18 BP: (!) 145/85 Patient Position (if appropriate): Sitting Oxygen Therapy SpO2: 95 % O2 Device: Room Air Pain:   ADL: ADL Eating: Minimal assistance Grooming: Minimal assistance Upper Body Bathing: Not assessed Lower Body Bathing: Not assessed Upper Body Dressing: Maximal assistance Lower Body Dressing:  Maximal assistance, Dependent Toileting: Maximal assistance Toilet Transfer: Dependent, Maximal assistance Vision   Perception    Praxis   Exercises:   Other Treatments:     Therapy/Group: Individual Therapy  Tonny Branch 10/06/2019, 4:04 PM

## 2019-10-06 NOTE — Progress Notes (Addendum)
Covid test sent and pending.  Results printed and given to CW.

## 2019-10-06 NOTE — Discharge Summary (Signed)
Physician Discharge Summary  Patient ID: [name] [name] MRN: [id number] DOB/AGE: October 17, 1950 69 y.o.  Admit date: 09/14/2019 Discharge date: 10/07/2019  Discharge Diagnoses:  Principal Problem:   Acute bilat watershed infarction [facility]) Active Problems:   Diabetes mellitus type 2, controlled, without complications ([place])   DVT (deep venous thrombosis) (HCC)   PFO (patent foramen ovale)   Seizures (HCC)   Spastic hemiparesis of right dominant side (HCC)   Labile blood pressure   Hypokalemia Mood stabilization CKD stage II Restless leg syndrome  Discharged Condition: Stable  Significant Diagnostic Studies: CT HEAD WO CONTRAST  Result Date: 09/27/2019 CLINICAL DATA:  Stroke.  Seizure. EXAM: CT HEAD WITHOUT CONTRAST TECHNIQUE: Contiguous axial images were obtained from the base of the skull through the vertex without intravenous contrast. COMPARISON:  CT head 09/21/2019.  MRI 09/22/2019 FINDINGS: Brain: Recent infarcts in both cerebral hemispheres similar in distribution and size to the prior studies. Low-density areas of subacute infarct in the right frontal parietal lobe appear unchanged. Possible but not definite small areas of hemorrhage in the infarcts on the right. Recent infarct in the left frontal lobe anteriorly is unchanged without evidence of hemorrhage. Left parietal infarct is smaller and unchanged. Ventricle size normal.  No midline shift. Vascular: Negative for hyperdense vessel Skull: Negative Sinuses/Orbits: Negative Other: None IMPRESSION: Subacute cerebral infarcts bilaterally, right greater than left are similar in size and distribution to prior studies. Questionable but not definite areas of mild hemorrhage in the right frontal parietal infarct. No midline shift. Electronically Signed   By: Franchot Gallo M.D.   On: 09/27/2019 14:47   CT HEAD WO CONTRAST  Result Date: 09/21/2019 CLINICAL DATA:  Increased lethargy and confusion EXAM: CT HEAD WITHOUT CONTRAST TECHNIQUE:  Contiguous axial images were obtained from the base of the skull through the vertex without intravenous contrast. COMPARISON:  09/14/19 FINDINGS: Brain: Progressive cortical infarction is noted bilaterally particularly within the higher parietal lobe and frontal lobes. Some parietooccipital infarct is noted on the right as well. Previously seen subarachnoid hemorrhage is not well appreciated on today's exam. Vascular: No hyperdense vessel or unexpected calcification. Skull: Normal. Negative for fracture or focal lesion. Sinuses/Orbits: No acute finding. Other: None. IMPRESSION: Progressive areas of cortical infarct when compared with the study from 3 days previous. Previously seen subarachnoid hemorrhage has resolved in the interval. No new hemorrhage is seen. Electronically Signed   By: Inez Catalina M.D.   On: 09/21/2019 14:29   CT HEAD WO CONTRAST  Result Date: 09/14/2019 CLINICAL DATA:  Neuro deficit with acute stroke suspected EXAM: CT HEAD WITHOUT CONTRAST TECHNIQUE: Contiguous axial images were obtained from the base of the skull through the vertex without intravenous contrast. COMPARISON:  09/11/2019 FINDINGS: Brain: Small patchy acute to subacute infarcts along the bilateral cerebral convexities with subarachnoid hemorrhage along the high and anterior right frontal sulcus, appearance stable from prior MRI. No evidence of acute infarct or interval hemorrhage. No hydrocephalus or worrisome swelling. Vascular: No hyperdense vessel or unexpected calcification. Skull: Normal. Negative for fracture or focal lesion. Sinuses/Orbits: No acute finding. IMPRESSION: Multifocal cortical infarction with small volume subarachnoid hemorrhage in the high right frontal lobe. No new or progressive finding when compared to brain MRI 09/11/2019. Electronically Signed   By: Monte Fantasia M.D.   On: 09/14/2019 11:07   MR BRAIN WO CONTRAST  Result Date: 09/22/2019 CLINICAL DATA:  69 year old female status post widespread  scattered and watershed infarcts in both cerebral hemispheres last month. New onset left side neglect  and right side gaze while in therapy. EXAM: MRI HEAD WITHOUT CONTRAST TECHNIQUE: Multiplanar, multiecho pulse sequences of the brain and surrounding structures were obtained without intravenous contrast. COMPARISON:  Brain MRI 09/11/2019 and earlier. FINDINGS: The examination had to be discontinued prior to completion due to patient altered mental status. Diagnostic diffusion-weighted imaging plus axial T2, FLAIR, sagittal T1 and coronal T2 weighted imaging was obtained. Brain: Confluent new areas of restricted diffusion in the right middle and bilateral superior frontal gyri when compared to 09/11/2019 (series 3, image 40). Confluent superior right perirolandic involvement. Much smaller new or increased area of right lateral occipital lobe diffusion restriction on series 3, image 26. No deep gray nuclei, brainstem or cerebellar involvement. Confluent cytotoxic edema in the new areas of ischemia. No midline shift or significant intracranial mass effect. Basilar cisterns remain patent. No ventriculomegaly. Expected evolution of the January ischemic disease. No new signal abnormality in the deep gray nuclei, brainstem or cerebellum. Pituitary and cervicomedullary junction appear to remain normal. Vascular: Major intracranial vascular flow voids are preserved and stable. Skull and upper cervical spine: Grossly negative visible cervical spine. Normal bone marrow signal. Sinuses/Orbits: Negative orbits. Paranasal sinuses and mastoids are stable and well pneumatized. Other: Scalp and face soft tissues appear negative. IMPRESSION: 1. Confluent new areas of acute/subacute infarction in the bilateral superior frontal gyri, more extensive on the right. Much smaller new area of right lateral occipital lobe infarction. 2. Cytotoxic edema with no evidence of hemorrhage hemorrhage, and no significant intracranial mass effect. 3.  The examination had to be discontinued prior to completion due to patient altered mental status. Electronically Signed   By: Genevie Ann M.D.   On: 09/22/2019 17:09   MR BRAIN WO CONTRAST  Result Date: 09/11/2019 CLINICAL DATA:  Initial evaluation for acute difficulty walking, stroke suspected. EXAM: MRI HEAD WITHOUT CONTRAST TECHNIQUE: Multiplanar, multiecho pulse sequences of the brain and surrounding structures were obtained without intravenous contrast. COMPARISON:  Comparison made with multiple recent brain MRIs, most recent of which from 09/07/2019. FINDINGS: Brain: Generalized age-related cerebral atrophy with chronic microvascular ischemic disease again noted. Multiple remote lacunar infarcts present within the hemispheric cerebral white matter. Continued interval evolution of previously seen subacute watershed type infarcts involving the bilateral cerebral hemispheres. However, multiple new watershed type infarcts are now seen on today's exam, increased in size and number but with similar distribution as compared to previous. Overall, involvement is slightly worse within the right cerebral hemisphere as compared to the left. New patchy involvement of the bilateral parieto-occipital regions as well, also greater on the right (series 5, image 66). Now seen are a few scattered foci of susceptibility artifact associated with these infarcts, consistent with associated petechial hemorrhage (series 14, image 46). Additionally, there is a serpiginous focus of FLAIR and susceptibility artifact seen layering along a cortical sulcus within the right frontal lobe, concerning for a small volume subarachnoid hemorrhage, new from previous (series 14, image 46). No frank intraparenchymal hematoma. No significant mass effect. No mass lesion or midline shift. No hydrocephalus. No extra-axial fluid collection. Pituitary gland suprasellar region normal. Midline structures intact. Vascular: Major intracranial vascular flow  voids are maintained. Skull and upper cervical spine: Craniocervical junction normal. Upper cervical spine within normal limits. Bone marrow signal intensity normal. No scalp soft tissue abnormality. Sinuses/Orbits: Globes and orbital soft tissues within normal limits. Paranasal sinuses remain largely clear. No significant mastoid effusion. Other: None. IMPRESSION: 1. Continued interval evolution of previously seen subacute infarcts, with multiple new scattered  acute ischemic watershed type infarcts involving the bilateral cerebral hemispheres, right greater than left. Associated petechial hemorrhage about a few of these infarcts without frank hemorrhagic transformation. 2. Small volume subarachnoid hemorrhage layering along the cortical sulcus within the right frontal lobe, new from previous. 3. Otherwise stable exam with underlying cerebral atrophy, chronic microvascular ischemic disease, and multiple remote lacunar infarcts involving the hemispheric cerebral white matter. Critical Value/emergent results were called by telephone at the time of interpretation on 09/11/2019 at 8:43 pm to provider [facility] , who verbally acknowledged these results. Electronically Signed   By: Jeannine Boga M.D.   On: 09/11/2019 20:43   MR BRAIN WO CONTRAST  Result Date: 09/07/2019 CLINICAL DATA:  Initial evaluation for no deficit, possible acute stroke. History of recent strokes earlier this month. EXAM: MRI HEAD WITHOUT CONTRAST TECHNIQUE: Multiplanar, multiecho pulse sequences of the brain and surrounding structures were obtained without intravenous contrast. COMPARISON:  Previous CT from earlier same day as well as prior MRI from 08/18/2019. FINDINGS: Brain: Generalized age-related cerebral atrophy with chronic microvascular ischemic disease the and noted. Multiple remote lacunar infarcts noted within the hemispheric cerebral white matter. There has been interval evolution of previously seen small volume watershed  type infarcts involving the bilateral cerebral hemispheres. Overall, number and distribution of these infarcts is relatively similar, with perhaps a few new subcentimeter foci seen bilaterally. Overall, appearance is felt to be consistent with normal expected interval evolution, with no definite new or significant infarct identified. No evidence for hemorrhagic transformation, mass effect, or other complication. No other acute intracranial abnormality. No mass lesion, mass effect, or midline shift. No hydrocephalus. No extra-axial fluid collection. Pituitary gland within normal limits. Midline structures intact. Vascular: Major intracranial vascular flow voids are maintained. Skull and upper cervical spine: Craniocervical junction within normal limits. Degenerative spondylosis and facet hypertrophy at C4-5 with resultant mild spinal stenosis. Visualized upper cervical spine otherwise unremarkable. Bone marrow signal intensity within normal limits. No scalp soft tissue abnormality. Sinuses/Orbits: Globes and orbital soft tissues within normal limits. Mild scattered mucosal thickening noted within the ethmoidal air cells. Paranasal sinuses are otherwise clear. No mastoid effusion. Inner ear structures are within normal limits. Other: None. IMPRESSION: 1. Interval evolution of previously seen scattered small volume watershed type infarcts involving the bilateral cerebral hemispheres. Overall, appearance and distribution is relatively similar, and felt to be within normal limits for normal expected evolutionary changes. No hemorrhagic transformation or other complication identified. 2. Underlying atrophy with chronic microvascular ischemic disease. No other new acute intracranial abnormality. Electronically Signed   By: Jeannine Boga M.D.   On: 09/07/2019 19:26   CT ABDOMEN PELVIS W CONTRAST  Result Date: 09/22/2019 CLINICAL DATA:  Stroke, hypercoagulability EXAM: CT ABDOMEN AND PELVIS WITH CONTRAST  TECHNIQUE: Multidetector CT imaging of the abdomen and pelvis was performed using the standard protocol following bolus administration of intravenous contrast. CONTRAST:  133mL OMNIPAQUE IOHEXOL 300 MG/ML  SOLN COMPARISON:  08/23/2019 FINDINGS: Lower chest: No acute pleural or parenchymal lung disease. Hepatobiliary: No focal liver abnormality is seen. Status post cholecystectomy. No biliary dilatation. Pancreas: Unremarkable. No pancreatic ductal dilatation or surrounding inflammatory changes. Spleen: Normal in size without focal abnormality. Adrenals/Urinary Tract: There is a 3 mm nonobstructing calculus lower pole left kidney. The right kidney is unremarkable. Ureters and bladder are unremarkable. The adrenals appear normal. Stomach/Bowel: No bowel obstruction or ileus. Normal appendix right lower quadrant. Vascular/Lymphatic: There is moderate diffuse atherosclerosis throughout the abdominal aorta. No pathologic mediastinal or hilar adenopathy.  Reproductive: Uterus and bilateral adnexa are unremarkable. Other: No abdominal wall hernia or abnormality. No abdominopelvic ascites. Musculoskeletal: No acute or destructive bony lesions. Reconstructed images demonstrate no additional findings. IMPRESSION: 1. No acute intra-abdominal process. 2. 3 mm nonobstructing calculus lower pole left kidney. 3. Moderate diffuse atherosclerosis throughout the abdominal aorta. Electronically Signed   By: Randa Ngo M.D.   On: 09/22/2019 23:49   IR IVC FILTER PLMT / S&I Burke Keels GUID/MOD SED  Result Date: 09/24/2019 CLINICAL DATA:  Right peroneal vein DVT, patent foramen ovale and persistent multiple cerebral infarcts with clinical suspicion of paradoxical embolization causing cerebral infarcts. Request has been made to place an IVC filter to try to decrease potential for additional paradoxical embolization. EXAM: 1. ULTRASOUND GUIDANCE FOR VASCULAR ACCESS OF THE RIGHT INTERNAL JUGULAR VEIN. 2. IVC VENOGRAM. 3. PERCUTANEOUS IVC  FILTER PLACEMENT. ANESTHESIA/SEDATION: 1.5 mg IV Versed; 50 mcg IV Fentanyl. Total Moderate Sedation Time: 15 minutes. The patient's level of consciousness and physiologic status were continuously monitored during the procedure by Radiology nursing. CONTRAST:  62mL OMNIPAQUE IOHEXOL 300 MG/ML  SOLN FLUOROSCOPY TIME:  1 minutes and 48 seconds.  44.0 mGy. PROCEDURE: The procedure, risks, benefits, and alternatives were explained to the patient. Questions regarding the procedure were encouraged and answered. The patient understands and consents to the procedure. A time-out was performed prior to initiating the procedure. The right neck was prepped with chlorhexidine in a sterile fashion, and a sterile drape was applied covering the operative field. A sterile gown and sterile gloves were used for the procedure. Local anesthesia was provided with 1% Lidocaine. Ultrasound was utilized to confirm patency of the right internal jugular vein. Under direct ultrasound guidance, a 21 gauge needle was advanced into the right internal jugular vein with ultrasound image documentation performed. After securing access with a micropuncture dilator, a guidewire was advanced into the inferior vena cava. A deployment sheath was advanced over the guidewire. This was utilized to perform IVC venography. The deployment sheath was further positioned in an appropriate location for filter deployment. A Bard Denali IVC filter was then advanced in the sheath. This was then fully deployed in the infrarenal IVC. Final filter position was confirmed with a fluoroscopic spot image. After the procedure the sheath was removed and hemostasis obtained with manual compression. COMPLICATIONS: None. FINDINGS: IVC venography demonstrates a normal caliber IVC with no evidence of thrombus. Renal veins are identified bilaterally. The IVC filter was successfully positioned below the level of the renal veins and is appropriately oriented. IMPRESSION: Placement of  percutaneous IVC filter in infrarenal IVC. IVC venogram shows no evidence of IVC thrombus and normal caliber of the inferior vena cava. PLAN: Due to patient related comorbidities and/or clinical necessity, this IVC filter should be considered a permanent device. This patient will not be actively followed for future filter retrieval. Electronically Signed   By: Aletta Edouard M.D.   On: 09/24/2019 11:41   EEG adult  Result Date: 09/28/2019 Lora Havens, MD     09/28/2019  4:02 PM Patient Name: [name] [name] MRN: [id number] Epilepsy Attending: Lora Havens Referring Physician/Provider: Olin Hauser love, PA Date: 09/27/2019 Duration: 22.38 minutes Patient history: 69 year old female with subacute cerebral infarcts bilaterally, right greater than left who was noted to have an episode of unresponsiveness with left gaze preference.  EEG to evaluate for seizures. Level of alertness: Awake AEDs during EEG study: Keppra Technical aspects: This EEG study was done with scalp electrodes positioned according to the 10-20 International system  of electrode placement. Electrical activity was acquired at a sampling rate of 500Hz  and reviewed with a high frequency filter of 70Hz  and a low frequency filter of 1Hz . EEG data were recorded continuously and digitally stored. Description: No clear posterior dominant was seen.  EEG showed continuous generalized rhythmic 2 to 3 Hz delta slowing.  Hyperventilation photic summation were not performed. Abnormalities -Continuous slow, generalized IMPRESSION: This study is suggestive of moderate to severe diffuse encephalopathy, nonspecific to etiology but could be secondary to underlying strokes. No seizures or epileptiform discharges were seen throughout the recording. Lora Havens   ECHO TEE  Result Date: 09/14/2019   TRANSESOPHOGEAL ECHO REPORT   Patient Name:   [name] [name] Date of Exam: 09/14/2019 Medical Rec #:  [id number]       Height:       64.0 in Accession #:     [id number]      Weight:       200.6 lb Date of Birth:  1951/05/12      BSA:          1.96 m Patient Age:    67 years        BP:           139/85 mmHg Patient Gender: F               HR:           72 bpm. Exam Location:  Inpatient  Procedure: Transesophageal Echo, Cardiac Doppler and Color Doppler Indications:     stroke  History:         Patient has prior history of Echocardiogram examinations, most                  recent 08/19/2019.  Sonographer:     Johny Chess Referring Phys:  [id number] Woodfin Ganja THOMPSON Diagnosing Phys: Oswaldo Milian MD  PROCEDURE: Patients was monitored while under deep sedation. The transesophogeal probe was passed through the esophogus of the patient. The patient developed no complications during the procedure. IMPRESSIONS  1. Left ventricular ejection fraction, by visual estimation, is 60 to 65%. The left ventricle has normal function. There is mildly increased left ventricular hypertrophy.  2. Global right ventricle has normal systolic function.The right ventricular size is normal. No increase in right ventricular wall thickness.  3. The mitral valve is normal in structure. Trivial mitral valve regurgitation.  4. The tricuspid valve is normal in structure. Tricuspid valve regurgitation is trivial.  5. The aortic valve is tricuspid. Aortic valve regurgitation is mild.  6. The pulmonic valve was grossly normal. Pulmonic valve regurgitation is trivial.  7. Lipomatous hypertrophy of interatrial septum. Positive bubble study consistent wtih PFO. FINDINGS  Left Ventricle: Left ventricular ejection fraction, by visual estimation, is 60 to 65%. The left ventricle has normal function. The left ventricle has no regional wall motion abnormalities. There is mildly increased left ventricular hypertrophy. Right Ventricle: The right ventricular size is normal. No increase in right ventricular wall thickness. Global RV systolic function is has normal systolic function. Left Atrium: Left atrial  size was normal in size. Right Atrium: Right atrial size was normal in size Prominent Chiari network and Prominent Eustachian valve. Pericardium: There is no evidence of pericardial effusion. Mitral Valve: The mitral valve is normal in structure. Trivial mitral valve regurgitation. Tricuspid Valve: The tricuspid valve is normal in structure. Tricuspid valve regurgitation is trivial. Aortic Valve: The aortic valve is tricuspid. Aortic valve regurgitation is mild. Pulmonic Valve:  The pulmonic valve was grossly normal. Pulmonic valve regurgitation is trivial. Aorta: The aortic root is normal in size and structure. Shunts: Agitated saline contrast was given intravenously to evaluate for intracardiac shunting. Saline shunting was observed within 3-6 cardiac cycles suggestive of interatrial shunt. Evidence of atrial level shunting detected by color flow Doppler.  Oswaldo Milian MD Electronically signed by Oswaldo Milian MD Signature Date/Time: 09/14/2019/7:10:52 PM    Final    CT HEAD CODE STROKE WO CONTRAST  Result Date: 09/07/2019 CLINICAL DATA:  Code stroke. Neuro deficit, acute, stroke suspected. EXAM: CT HEAD WITHOUT CONTRAST TECHNIQUE: Contiguous axial images were obtained from the base of the skull through the vertex without intravenous contrast. COMPARISON:  CT angiogram head/neck 08/19/2019, brain MRI 08/18/2019, head CT 08/18/2019 FINDINGS: Brain: Multifocal subacute watershed infarcts again demonstrated within the bilateral cerebral hemispheres as well as a small subacute right occipital lobe infarct largely better appreciated on prior MRI 08/18/2019. No new demarcated infarct is identified. No evidence of acute intracranial hemorrhage. No evidence of intracranial mass. No midline shift or extra-axial fluid collection. Background mild ill-defined hypoattenuation within cerebral white matter is nonspecific, but consistent with chronic small vessel ischemic disease. Mild generalized parenchymal  atrophy. Vascular: No hyperdense vessel.  Atherosclerotic calcifications. Skull: Normal. Negative for fracture or focal lesion. Sinuses/Orbits: Visualized orbits demonstrate no acute abnormality. Tiny left ethmoid sinus osteoma. Minimal ethmoid sinus mucosal thickening. No significant mastoid effusion. These results were called by telephone at the time of interpretation on 09/07/2019 at 5:20 pm to provider Dr. Rory Percy, who verbally acknowledged these results. IMPRESSION: Multifocal subacute watershed infarcts again demonstrated within the bilateral cerebral hemispheres as well as a small subacute right occipital lobe infarct, largely better appreciated on prior MRI 08/18/2019. No new demarcated infarct is identified. No evidence of acute intracranial hemorrhage. Stable background mild generalized parenchymal atrophy and chronic small vessel ischemic disease. Electronically Signed   By: Kellie Simmering DO   On: 09/07/2019 17:21    Labs:  Basic Metabolic Panel: Recent Labs  Lab 10/03/19 0537  NA 135  K 3.4*  CL 99  CO2 25  GLUCOSE 129*  BUN 13  CREATININE 0.72  CALCIUM 9.8    CBC: Recent Labs  Lab 10/03/19 0537 10/05/19 0705  WBC 11.1* 10.7*  HGB 13.3 13.9  HCT 39.5 40.8  MCV 87.6 86.8  PLT 435* 405*    CBG: Recent Labs  Lab 10/05/19 2102 10/06/19 0617 10/06/19 1150 10/06/19 1630 10/06/19 2120  GLUCAP 154* 169* 146* 176* 86   Family history.  Mother with diabetes melitis as well as hemophilia.  Sister with breast cancer.  Father with CAD and colon polyps.  Brother with diabetes mellitus.  Denies any esophageal cancer or rectal cancer  Brief HPI:   GERALDIN DZIALO is a 69 y.o. right-handed female with history of CAD with angioplasty, CKD stage II, COPD with chronic hypoxemic respiratory failure maintained on 3 L of oxygen at home at night, type 2 diabetes mellitus, recent shoulder fracture nonsurgical managed with a sling, hypertension, hyperlipidemia, thoracic aortic aneurysm,  patient quit smoking 12 months ago.  She lives alone 1 level home.  Used a walker occasionally prior to admission.  Patient had admission 08/18/2019 to 08/20/2019 for acute CVA with MRI showing watershed infarction in bilateral hemispheres.  CT angiogram of head and neck revealed 50% stenosis of the proximal right internal carotid artery due to bulky atherosclerotic calcification.  Carotid Dopplers with no ICA stenosis.  TTE with normal ejection fraction.  Patient was discharged home on aspirin and Plavix x3 months then Plavix alone.  On 08/23/2019 hospital evaluation by PCP noted to have shortness of breath where CT angiogram of the chest showed acute bilateral segmental subsegmental pulmonary emboli.  Subsequent DVT scan showed right lower extremity DVT involving the right peroneal vein.  Patient started on Xarelto replacing aspirin and Plavix.  On 08/30/2019 bubble study done by outpatient neurology showed a small PFO but it was not felt large enough to allow for emboli to pass from right to left side of heart.  Patient presented to the ED 09/07/2019 due to left hemiparesis.  CT of the head as well as MRI of the brain showed evolution of old stroke without new infarction.  As result, patient was subsequent discharged home.  She presented on 09/11/2019 with increasing left-sided weakness as well as hypotension.  She did receive a fluid bolus.  MRI and imaging revealed continued interval evolution of previously seen subacute infarcts with multiple new scattered acute ischemic watershed type infarctions involving the bilateral cerebral hemispheres right greater than left.  Associated petechial hemorrhage about a few of these infarcts without frank hemorrhagic transformation.  Small volume subarachnoid hemorrhage layering along the cortical sulcus within the right frontal lobe, new from previous tracing.  Neurology consulted hypercoagulable work-up patient Xarelto changed to Eliquis given new infarctions and pulmonary  emboli.  A follow-up CT of the head completed for increasing left side weakness reviewed showed no acute changes.  However given current deficits suspect further extension discussed with neurology services, PFO and plans for possible intervention cardiology follow-up in regards to recent findings of PFO.  TEE completed positive for PFO.  Patient was admitted for a comprehensive rehab program.   Hospital Course: SAMREET HEGGIE was admitted to rehab 09/14/2019 for inpatient therapies to consist of PT, ST and OT at least three hours five days a week. Past admission physiatrist, therapy team and rehab RN have worked together to provide customized collaborative inpatient rehab.  Pertaining to patient's by cerebral hemispheric watershed infarctions with right frontal subarachnoid hemorrhage.  Patient with bilateral cerebral and occipital infarcts as well as pulmonary emboli with DVT.  Most recent MRI revealed progression of bifrontal infarction as well as new small infarct right lateral occipital lobe.  There was question venous source given PFO and lack of other obvious etiologies I VCF placed as recommended by neurology services 09/24/2019 right IJ route.  Patient did remain on Eliquis with no bleeding episodes.  Increased spasticity noted consideration of Botox as outpatient she was fitted with an elbow splint to maintain extension.  Pain managed with the use of Lidoderm patch as well as tramadol.  Patient had been on hydrocodone but discontinued due to lethargy.  She continued on Requip for restless leg syndrome monitoring of sedation.  Mood stabilization with Valium as needed for anxiety she had been on this prior to admission as well as scheduled Lexapro.  She was using melatonin to help aid in her sleep.  Her blood pressure monitor closely for any orthostasis as she continued on Cozaar 75 mg daily as well as Cardizem and low-dose Lasix.  Patient unresponsive episode 09/27/2019 there was question of seizure EEG  negative she was placed on Keppra for seizure prophylaxis recommendations by neurology service to continue as directed.  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  Diabetes mellitus hemoglobin A1c 9.4 she continued to be maintained on Glucophage.  She was  tolerating a regular consistency diet.   Blood pressures were monitored on TID basis and controlled  Diabetes has been monitored with ac/hs CBG checks and SSI was use prn for tighter BS control.   She was receiving a routine toileting schedule  [name] [name] has made gains during rehab stay and is attending therapies  [name] [name] will continue to receive follow up therapies   after discharge  Rehab course: During patient's stay in rehab weekly team conferences were held to monitor patient's progress, set goals and discuss barriers to discharge. At admission, patient required minimal assist 20 feet rolling walker minimal assist sit to stand minimal assist stand pivot transfers moderate assist upper body bathing moderate assist upper body dressing moderate assist lower body bathing max assist lower body dressing  Physical exam.  Blood pressure 181/72 pulse 72 temperature 97.9 respirations 19 oxygen saturations 95% room air Constitutional.  No acute distress HEENT Head.  Normocephalic and atraumatic Eyes.  Pupils round and reactive to light no discharge without nystagmus Neck.  Supple nontender no JVD without thyromegaly Cardiac regular rate rhythm without any extra sounds or murmur heard Respiratory.  Effort normal no respiratory distress without wheeze GI.  Soft nontender positive bowel sounds without rebound Musculoskeletal.  No edema or tenderness in extremities Neurological.  She was alert follow commands answers basic questions.  Patient did have a left neglect Motor.  Right upper right lower extremity 5 out of 5 proximal distal left upper extremity shoulder abduction elbow flexion extension 0  out of 5 hand grip 4 out of 5 with myotonia-like left lower extremity.  Hip flexion 1 out of 5 distally 0 out of 5 Sensation diminished to light touch  /She  has had improvement in activity tolerance, balance, postural control as well as ability to compensate for deficits. [name] [name] has had improvement in functional use RUE/LUE  and RLE/LLE as well as improvement in awareness.  Patient rolls right to left with max assist verbal and tactile cues for technique and right upper extremity placement.  Improved ability to initiate and produce enough force to roll without assistance.  Donned new pants in supine, patient able to thread right lower extremity without assist.  Supine to sit with total assist +2.  Min mod static sitting balance prior to stedy +2 transfers.  Verbal and tactile cues for right upper extremity placement.  Total assist with transport to and from therapy gym.  Perform sit to stand x2 and stedy.  Patient required total assist for pericare.  Patient transitioned into side-lying and then into sitting edge of bed with head of bed raised and max assist plus to assist.  Max is overall required for static sitting balance edge of bed for ADLs.  Patient doffed shirt with max assist requiring multimodal cueing to initiate sequence.  Completed upper body bathing with minimal assist max assist to don shirt with good recall of hemitechnique.  Due to patient's limited advances it was felt skilled nursing facility was needed with bed becoming available 10/07/2019       Disposition: Discharge to skilled nursing facility    Diet: Carb modified diet  Special Instructions: No smoking or alcohol  2 L oxygen nasal cannula as needed keeping oxygen saturation greater than 90%  Medications at discharge 1.  Tylenol as needed 2.  Eliquis 5 mg p.o. twice daily 3.  Lipitor 20 mg p.o. nightly 4.  Valium 2 mg daily as needed anxiety 5.  Cardizem CD 120 mg p.o. daily 6.  Lexapro 10  mg p.o. daily 7.  Lasix 20 mg  p.o. daily 8.  Keppra 1000 mg p.o. twice daily 9.  Lidoderm patch 1 patch transdermal administer over 12 hours every 24 hours 10.  Cozaar 75 mg p.o. daily 11.  Melatonin 3 mg p.o. nightly 12.  Glucophage 500 mg p.o. twice daily 13.  Requip 2 mg p.o. 3 times daily 14.  Tramadol 25 mg p.o. every 12 hours as needed 15 Albuterol inhaler two puffs every four hours as needed 16.Lumigan 0.01% 0ne drop both eyes QHS 17 azopt 1% ophthalamic susp one drop both eyes every 12 hours 18 Vitamin D 50,000 po units weekly   Discharge Instructions    Ambulatory referral to Neurology   Complete by: As directed    Follow up with Dr. Leonie Man at [facility] in 4 weeks. Too complicated for NP to follow. Thanks.   Ambulatory referral to Physical Medicine Rehab   Complete by: As directed    Follow-up 1 month bilateral watershed infarctions.  Patient for skilled nursing facility placement       Contact information for follow-up providers    Meredith Staggers, MD Follow up.   Specialty: Physical Medicine and Rehabilitation Why: Office to call for appointment Contact information: [address] [name] [address] [phone]        Sherren Mocha, MD Follow up.   Specialty: Cardiology Why: Call for appointment Contact information: [id number] [place] [address] [phone]        Garvin Fila, MD. Schedule an appointment as soon as possible for a visit in 4 week(s).   Specialties: Neurology, Radiology Contact information: [address] [place] [address] [phone]            Contact information for after-discharge care    Destination    [facility] .   Service: Skilled Nursing Contact information: [address] [place] [phone]                  Signed: Cathlyn Parsons 10/07/2019, 5:13 AM

## 2019-10-06 NOTE — Progress Notes (Signed)
Social Work Patient ID: [name] [name], female   DOB: [birth year], 69 y.o.   MRN: [id number]   SW received return phone call from [place] with [facility] [phone]) about referral. Reports pt will need to have both COVID vaccines before being admitted. Reports she will follow-up with pt son.   SW informed will speak with pt son [name] [name] [phone]) on above. He stated he was aware on above as well after speaking with admissions. Would like to explore [facility] if possible as this location is closer to home.  SW spoke with Tracy/Admissions with [facility] [phone]) who accepted referral. Pt requires negative COVID test prior to admission. SW informed results will  SW left message for pt son [name] [name] to inform on above and stated there would be an update on transportation time, and room number.  *SW spoke pt son [name] [name] to inform on above. SW followed up with [facility] [phone]) to decline referral as pt going to another SNF.  SW spoke with Jana/[facility] [phone]) to schedule ambulance transport for 12pm pick up. SW updated medical team. SW will get pt t room number and nurse report information in the morning.   [name], MSW, [place] Office: [phone] Cell: [phone] Fax: [phone]

## 2019-10-06 NOTE — Progress Notes (Signed)
[place] PHYSICAL MEDICINE & REHABILITATION PROGRESS NOTE   Subjective/Complaints: Up with PT. Didn't sleep well again last night. Left arm still tight  ROS: Patient denies fever, rash, sore throat, blurred vision, nausea, vomiting, diarrhea, cough, shortness of breath or chest pain,   headache, or mood change.    Objective:   No results found. Recent Labs    10/05/19 0705  WBC 10.7*  HGB 13.9  HCT 40.8  PLT 405*   No results for input(s): NA, K, CL, CO2, GLUCOSE, BUN, CREATININE, CALCIUM in the last 72 hours.  Intake/Output Summary (Last 24 hours) at 10/06/2019 0915 Last data filed at 10/06/2019 0804 Gross per 24 hour  Intake 420 ml  Output 350 ml  Net 70 ml     Physical Exam: Vital Signs Blood pressure 140/67, pulse 81, temperature 97.8 F (36.6 C), temperature source Oral, resp. rate 18, height 5\' 4"  (1.626 m), weight 83.3 kg, SpO2 92 %. Constitutional: No distress . Vital signs reviewed. HEENT: EOMI, oral membranes moist Neck: supple Cardiovascular: RRR without murmur. No JVD    Respiratory/Chest: CTA Bilaterally without wheezes or rales. Normal effort    GI/Abdomen: BS +, non-tender, non-distended Ext: no edema, pulses intact Skin: Warm and dry.  Intact. Psych: flat Musc: No edema in extremities.  No tenderness in extremities. Neurological: Alert and oriented to place, month, day, reason she's here. Engages quickly.  STM deficits.    LUE/LLE: 0/5 proximal to distal, significant left inattention. DTR's 3+ on left. Flexor upper tone---MAS 2-3/4, LLE trace  Assessment/Plan: 1. Functional deficits secondary to bicortical infarcts, Right frontal SAH which require 3+ hours per day of interdisciplinary therapy in a comprehensive inpatient rehab setting.  Physiatrist is providing close team supervision and 24 hour management of active medical problems listed below.  Physiatrist and rehab team continue to assess barriers to discharge/monitor patient progress toward  functional and medical goals  Care Tool:  Bathing  Bathing activity did not occur: Refused Body parts bathed by patient: Left arm, Chest, Abdomen, Face   Body parts bathed by helper: Front perineal area, Buttocks     Bathing assist Assist Level: 2 Helpers     Upper Body Dressing/Undressing Upper body dressing   What is the patient wearing?: Pull over shirt    Upper body assist Assist Level: Maximal Assistance - Patient 25 - 49%    Lower Body Dressing/Undressing Lower body dressing      What is the patient wearing?: Pants     Lower body assist Assist for lower body dressing: 2 Helpers     Toileting Toileting    Toileting assist Assist for toileting: Dependent - Patient 0%     Transfers Chair/bed transfer  Transfers assist     Chair/bed transfer assist level: 2 Helpers     Locomotion Ambulation   Ambulation assist   Ambulation activity did not occur: Safety/medical concerns          Walk 10 feet activity   Assist  Walk 10 feet activity did not occur: Safety/medical concerns        Walk 50 feet activity   Assist Walk 50 feet with 2 turns activity did not occur: Safety/medical concerns         Walk 150 feet activity   Assist Walk 150 feet activity did not occur: Safety/medical concerns         Walk 10 feet on uneven surface  activity   Assist Walk 10 feet on uneven surfaces activity did not occur:  Safety/medical concerns         Wheelchair     Assist Will patient use wheelchair at discharge?: No(LT goals not set )             Wheelchair 50 feet with 2 turns activity    Assist            Wheelchair 150 feet activity     Assist          Blood pressure 140/67, pulse 81, temperature 97.8 F (36.6 C), temperature source Oral, resp. rate 18, height 5\' 4"  (1.626 m), weight 83.3 kg, SpO2 92 %.  Medical Problem List and Plan: 1.  Left hemiparesis-now spastic, limitations in self-care secondary to  bi-cerebral hemisphere watershed infarct with right frontal SAH. Patient with bilateral cerebral and right occipital infarcts as well as pulmonary emboli with DVT.  -recent MRI revealed progression of bi-frontal infarcts as well as new mall infarcts right lateral occ lobe   -?venous source given PFO and lack of other obvious etiologies. IVCF placed as recommended by neurology, completed by IR on 09/23/2018 right IJ route  Unable to tolerate tizanidine.     Continue orthoses and range of motion    -botox as outpt    -consider elbow splint to maintain extension  SNF pending --she is ready for discharge 2. DVT/anticoagulation/Hx of PE: Eliquis             -antiplatelet therapy: N/A 3. Pain Management: Lidoderm patch change as directed  -hydrocodone  D/ced due to ams  -sports cream  -low dose tramadol 25mg  q8 prn  -left RTC/OA shoulder: had left shoulder injection 2 mos ago apparently   -pain worsened by left HP, capsulitis   Consider another injection at some point when more medically stable   -continue shoulder ROM and support  2/25 Right elbow painful with extending d/t tone 4. Mood: Valium as needed for anxiety and depression             -antipsychotic agents: N/A  -Has expressed some anxiety/depression/distraction d/t husband's suicide. Has difficulty disengaging from thoughts at times.    -have requested neuropsych input    -on lexapro 10mg  daily currently 5. Neuropsych: This patient is not fully capable of making decisions on her own behalf. 6. Skin/Wound Care: Routine skin checks 7. Fluids/Electrolytes/Nutrition: encourage PO  Potassium 3.4 again  2/22. Continue supp  Sodium 135 2/22 8.  PFO.  Follow cardiology services.  TEE completed, suggesting PFO, await further Cards recs.. 9.  Hypertension.  Cardizem 120 mg daily, Lasix 20 mg daily  Cozaar 50 mg daily, increased to 75 on 2/22            2/23 some improvement--continue to follow for now  2/24:  flowsheet reviewed. Has been  quite labile with range from SBP 106 to 166. Will continue to monitor.  10.  Hyperlipidemia: Continue Lipitor 11.  Diabetes mellitus with hyperglycemia.  Hemoglobin A1c 9.4.  SSI.  Patient on Glucophage 1000 mg twice daily prior to admission.  Resume as needed           CBG (last 3)  Recent Labs    10/05/19 1649 10/05/19 2102 10/06/19 0617  GLUCAP 124* 154* 169*   Relatively controlled on 2/23, 2/24.  13.  ?  CKD stage II.               BUN/creatinine 13/0.72 14.  Restless leg syndrome.  Requip 2 mg 3 times daily--Initially Reduced to HS d/t lethargy  2/11 home requip resumed d/t improved arousal.  15. Unresponsive episode 2/16   ?seizure    -keppra initiated after episode on 2/16   -EEG demonstrated generalized slowing, CT without changes  -avoid spasticity medications. Suspect episode was d/t low bp/tizanidine effects   No repeat seizures since initiation of Keppra--d/w neuro re: lower dosing  15. Leukocytosis: WBC's slowly creeping up.    -no signs of infection   -recheck 2/24 10.7 16. Insomnia: 2/24: added melatonin HS prn. --received at 0100   2/25 will schedule at 9pm  LOS: 22 days A FACE TO [place] 10/06/2019, 9:15 AM

## 2019-10-06 NOTE — Progress Notes (Signed)
Physical Therapy Discharge Summary  Patient Details  Name: [name] [name] MRN: [id number] Date of Birth: [birth year]  Patient has met 0 of 4 long term goals. Pt with progression of neurological changes throughout CIR admission. Despite goals adjusted as a result, pt still does not meet goals at this time 2/2 functional decline and significant cognitive deficits limiting carryover.  Patient to discharge at a dependent wheelchair level. Focus has been to decrease burden of care w/ emphasis on bed mobility and transfers. She is consistently performing R/L rolling w/ mod-max assist and requiring less skilled cues for technique and positioning. She requires +2 assist for sit<>supine and transfers via slide board or stedy.   Patient's care partner (son) unavailable to provide the necessary physical assistance at discharge, pt to d/c to SNF.  Reasons goals not met: see above  Recommendation:  Patient will benefit from ongoing skilled PT services in skilled nursing facility setting to continue to advance safe functional mobility, address ongoing impairments in L hemiplegia, postural control, midline orientation, and motor planning, and minimize fall risk.  Equipment: n/a  Reasons for discharge: lack of progress toward goals and discharge from hospital  Patient/family agrees with progress made and goals achieved: Yes  PT Discharge Precautions/Restrictions Precautions Precautions: Fall Restrictions Weight Bearing Restrictions: No Vital Signs Therapy Vitals Temp: 98.1 F (36.7 C) Temp Source: Oral Pulse Rate: 96 Resp: 18 BP: (!) 145/85 Patient Position (if appropriate): Sitting Oxygen Therapy SpO2: 95 % O2 Device: Room Air Vision/Perception  Vision - Assessment Eye Alignment: Within Functional Limits Ocular Range of Motion: Within Functional Limits Alignment/Gaze Preference: Head turned;Gaze right Tracking/Visual Pursuits: Decreased smoothness of eye movement to LEFT superior  field;Decreased smoothness of eye movement to LEFT inferior field;Requires cues, head turns, or add eye shifts to track Perception Perception: Impaired Inattention/Neglect: Does not attend to left side of body;Does not attend to left visual field Praxis Praxis: Impaired Praxis Impairment Details: Ideomotor;Motor planning;Initiation  Cognition   Sensation Sensation Light Touch: Appears Intact Coordination Gross Motor Movements are Fluid and Coordinated: No Coordination and Movement Description: Dense L hemi Motor  Motor Motor: Hemiplegia;Motor apraxia;Abnormal tone;Abnormal postural alignment and control Motor - Discharge Observations: Dense L hemi, motor apraxia, L pushing, increased L hamstring tone  Mobility Bed Mobility Bed Mobility: Rolling Left;Supine to Sit;Rolling Right;Sit to Supine Rolling Right: Total Assistance - Patient < 25% Rolling Left: Moderate Assistance - Patient 50-74% Supine to Sit: Total Assistance - Patient < 25% Sit to Supine: Total Assistance - Patient < 25% Transfers Transfers: Sit to Stand;Stand to Sit;Transfer via [place] to Stand: Total Assistance - Patient < 25% Stand to Sit: Total Assistance - Patient < 25% Stand Pivot Transfers: Dependent - mechanical lift Stand Pivot Transfer Details: Manual facilitation for weight shifting;Manual facilitation for placement;Manual facilitation for weight bearing;Verbal cues for technique;Verbal cues for sequencing;Tactile cues for initiation;Tactile cues for weight shifting;Tactile cues for posture Transfer via Lift Equipment: Probation officer Ambulation: No Gait Gait: No Stairs / Additional Locomotion Stairs: No Wheelchair Mobility Wheelchair Mobility: No  Trunk/Postural Assessment  Cervical Assessment Cervical Assessment: Exceptions to WFL(forward head) Thoracic Assessment Thoracic Assessment: Exceptions to WFL(kyphotic) Lumbar Assessment Lumbar Assessment: Exceptions to WFL(posterior  pelvic tilt) Postural Control Postural Control: Deficits on evaluation(L lateral lean/pushing, posterior lean bias)  Balance Balance Balance Assessed: Yes Static Sitting Balance Static Sitting - Level of Assistance: 4: Min assist Dynamic Sitting Balance Dynamic Sitting - Level of Assistance: 1: +1 Total assist Extremity Assessment  RLE Assessment RLE Assessment:  Exceptions to [facility] General Strength Comments: Hip strength 3/5, knee and ankle 5/5 LLE Assessment LLE Assessment: Exceptions to [facility] Passive Range of Motion (PROM) Comments: R knee extension and ankle DF tight, however able to reach end range w/ stretching, General Strength Comments: Pt unable to volitionally activate LLE, no activation palpated w/ automatic tasks    Janki Dike K Akaisha Truman 10/06/2019, 5:32 PM

## 2019-10-06 NOTE — Progress Notes (Signed)
Physical Therapy Session Note  Patient Details  Name: MATILDA WALDON MRN: [id number] Date of Birth: 1951-02-08  Today's Date: 10/06/2019 PT Individual Time: 0805-0840 PT Individual Time Calculation (min): 35 min   Short Term Goals: Week 3:  PT Short Term Goal 1 (Week 3): =LTGs due to ELOS  Skilled Therapeutic Interventions/Progress Updates:   Pt in supine and agreeable to therapy, no c/o pain. R/L rolling to don new brief (brief had been torn) and to don pants. Mod assist to roll to L, total assist to roll to R. Supine>sit w/ total assist and verbal/tactile cues for technique. Worked on maintaining static sitting balance while engaging in self-care tasks. Max fading to min assist to maintain w/ verbal and tactile cues for posture and to continue to actively correct to midline from L lateral lean. Pt brushed hair and washed face, no cues needed to initiate or terminate task. Maintained static sitting for ~10 min in total before performing total assist +2 slide board transfer to TIS. +2 assist needed to scoot hips back in chair. Therapist attempting to passively extend L elbow and rotate UE externally to more neutral UE posture. Propped UE up on towel rolls to hold externally rotated position. Ended session tilted in TIS, all needs in reach.   Therapy Documentation Precautions:  Precautions Precautions: Fall Precaution Comments: recent L humeral fx, no orders Restrictions Weight Bearing Restrictions: No  Therapy/Group: Individual Therapy  Leesha Veno K Twala Collings 10/06/2019, 8:45 AM

## 2019-10-06 NOTE — Progress Notes (Signed)
Speech Language Pathology Daily Session Note  Patient Details  Name: [name] [name] MRN: [id number] Date of Birth: 1951-04-23  Today's Date: 10/06/2019 SLP Individual Time: 0930-1010 SLP Individual Time Calculation (min): 40 min  Short Term Goals: Week 3: SLP Short Term Goal 1 (Week 3): Pt will sustain attention to tasks for 5 minute intervals with Mod A cues for redirection. SLP Short Term Goal 2 (Week 3): Pt will recall new and/or complex daily information with Max A verbal/visual cues for use of compensatory strategies. SLP Short Term Goal 3 (Week 3): Pt will demonstrate problem solving during basic tasks with Max A verbal/visual cues. SLP Short Term Goal 4 (Week 3): Patient will scan to her left field of envionrment during functional tasks with Mod A multimodal cues.  Skilled Therapeutic Interventions: Skilled treatment session focused on cognitive goals. Upon arrival, patient was upright in the wheelchair but had been incontinent. Patient was transferred back to bed via the [facility] and required Max A for bed mobility while changing her brief. SLP also facilitated session by providing Max A multimodal cues for problem solving and attention while trying to identify safety issues while utilizing picture cards. Patient left upright in bed with alarm on and all needs within reach. Continue with current plan of care.    Pain No/Denies Pain   Therapy/Group: Individual Therapy  Debbrah Sampedro 10/06/2019, 12:38 PM

## 2019-10-07 DIAGNOSIS — I639 Cerebral infarction, unspecified: Secondary | ICD-10-CM | POA: Diagnosis not present

## 2019-10-07 DIAGNOSIS — Q211 Atrial septal defect: Secondary | ICD-10-CM | POA: Diagnosis not present

## 2019-10-07 DIAGNOSIS — I10 Essential (primary) hypertension: Secondary | ICD-10-CM | POA: Diagnosis not present

## 2019-10-07 DIAGNOSIS — I2510 Atherosclerotic heart disease of native coronary artery without angina pectoris: Secondary | ICD-10-CM | POA: Diagnosis not present

## 2019-10-07 DIAGNOSIS — I712 Thoracic aortic aneurysm, without rupture: Secondary | ICD-10-CM | POA: Diagnosis not present

## 2019-10-07 DIAGNOSIS — E119 Type 2 diabetes mellitus without complications: Secondary | ICD-10-CM | POA: Diagnosis not present

## 2019-10-07 DIAGNOSIS — I2699 Other pulmonary embolism without acute cor pulmonale: Secondary | ICD-10-CM | POA: Diagnosis not present

## 2019-10-07 DIAGNOSIS — M79601 Pain in right arm: Secondary | ICD-10-CM | POA: Diagnosis not present

## 2019-10-07 DIAGNOSIS — I2694 Multiple subsegmental pulmonary emboli without acute cor pulmonale: Secondary | ICD-10-CM | POA: Diagnosis not present

## 2019-10-07 DIAGNOSIS — R1310 Dysphagia, unspecified: Secondary | ICD-10-CM | POA: Diagnosis not present

## 2019-10-07 DIAGNOSIS — S4292XD Fracture of left shoulder girdle, part unspecified, subsequent encounter for fracture with routine healing: Secondary | ICD-10-CM | POA: Diagnosis not present

## 2019-10-07 DIAGNOSIS — M6281 Muscle weakness (generalized): Secondary | ICD-10-CM | POA: Diagnosis not present

## 2019-10-07 DIAGNOSIS — I6389 Other cerebral infarction: Secondary | ICD-10-CM | POA: Diagnosis not present

## 2019-10-07 DIAGNOSIS — S61001D Unspecified open wound of right thumb without damage to nail, subsequent encounter: Secondary | ICD-10-CM | POA: Diagnosis not present

## 2019-10-07 DIAGNOSIS — R230 Cyanosis: Secondary | ICD-10-CM | POA: Diagnosis not present

## 2019-10-07 DIAGNOSIS — R278 Other lack of coordination: Secondary | ICD-10-CM | POA: Diagnosis not present

## 2019-10-07 DIAGNOSIS — E785 Hyperlipidemia, unspecified: Secondary | ICD-10-CM | POA: Diagnosis not present

## 2019-10-07 DIAGNOSIS — Z20828 Contact with and (suspected) exposure to other viral communicable diseases: Secondary | ICD-10-CM | POA: Diagnosis not present

## 2019-10-07 DIAGNOSIS — M79603 Pain in arm, unspecified: Secondary | ICD-10-CM | POA: Diagnosis not present

## 2019-10-07 DIAGNOSIS — R410 Disorientation, unspecified: Secondary | ICD-10-CM | POA: Diagnosis not present

## 2019-10-07 DIAGNOSIS — L819 Disorder of pigmentation, unspecified: Secondary | ICD-10-CM | POA: Diagnosis not present

## 2019-10-07 DIAGNOSIS — D72829 Elevated white blood cell count, unspecified: Secondary | ICD-10-CM | POA: Diagnosis not present

## 2019-10-07 DIAGNOSIS — K5700 Diverticulitis of small intestine with perforation and abscess without bleeding: Secondary | ICD-10-CM | POA: Diagnosis not present

## 2019-10-07 DIAGNOSIS — E876 Hypokalemia: Secondary | ICD-10-CM | POA: Diagnosis not present

## 2019-10-07 DIAGNOSIS — I739 Peripheral vascular disease, unspecified: Secondary | ICD-10-CM | POA: Diagnosis not present

## 2019-10-07 DIAGNOSIS — R799 Abnormal finding of blood chemistry, unspecified: Secondary | ICD-10-CM | POA: Diagnosis not present

## 2019-10-07 DIAGNOSIS — Z7401 Bed confinement status: Secondary | ICD-10-CM | POA: Diagnosis not present

## 2019-10-07 DIAGNOSIS — J449 Chronic obstructive pulmonary disease, unspecified: Secondary | ICD-10-CM | POA: Diagnosis not present

## 2019-10-07 DIAGNOSIS — G8111 Spastic hemiplegia affecting right dominant side: Secondary | ICD-10-CM | POA: Diagnosis not present

## 2019-10-07 DIAGNOSIS — R569 Unspecified convulsions: Secondary | ICD-10-CM | POA: Diagnosis not present

## 2019-10-07 DIAGNOSIS — I82409 Acute embolism and thrombosis of unspecified deep veins of unspecified lower extremity: Secondary | ICD-10-CM | POA: Diagnosis not present

## 2019-10-07 DIAGNOSIS — I959 Hypotension, unspecified: Secondary | ICD-10-CM | POA: Diagnosis not present

## 2019-10-07 DIAGNOSIS — R7989 Other specified abnormal findings of blood chemistry: Secondary | ICD-10-CM | POA: Diagnosis not present

## 2019-10-07 DIAGNOSIS — M6259 Muscle wasting and atrophy, not elsewhere classified, multiple sites: Secondary | ICD-10-CM | POA: Diagnosis not present

## 2019-10-07 DIAGNOSIS — Z8781 Personal history of (healed) traumatic fracture: Secondary | ICD-10-CM | POA: Diagnosis not present

## 2019-10-07 DIAGNOSIS — G8114 Spastic hemiplegia affecting left nondominant side: Secondary | ICD-10-CM | POA: Diagnosis not present

## 2019-10-07 DIAGNOSIS — R41841 Cognitive communication deficit: Secondary | ICD-10-CM | POA: Diagnosis not present

## 2019-10-07 DIAGNOSIS — R0989 Other specified symptoms and signs involving the circulatory and respiratory systems: Secondary | ICD-10-CM | POA: Diagnosis not present

## 2019-10-07 DIAGNOSIS — I69054 Hemiplegia and hemiparesis following nontraumatic subarachnoid hemorrhage affecting left non-dominant side: Secondary | ICD-10-CM | POA: Diagnosis not present

## 2019-10-07 DIAGNOSIS — M25512 Pain in left shoulder: Secondary | ICD-10-CM | POA: Diagnosis not present

## 2019-10-07 DIAGNOSIS — Z79899 Other long term (current) drug therapy: Secondary | ICD-10-CM | POA: Diagnosis not present

## 2019-10-07 DIAGNOSIS — K1379 Other lesions of oral mucosa: Secondary | ICD-10-CM | POA: Diagnosis not present

## 2019-10-07 DIAGNOSIS — I69318 Other symptoms and signs involving cognitive functions following cerebral infarction: Secondary | ICD-10-CM | POA: Diagnosis not present

## 2019-10-07 DIAGNOSIS — G2581 Restless legs syndrome: Secondary | ICD-10-CM | POA: Diagnosis not present

## 2019-10-07 DIAGNOSIS — E46 Unspecified protein-calorie malnutrition: Secondary | ICD-10-CM | POA: Diagnosis not present

## 2019-10-07 DIAGNOSIS — M1380 Other specified arthritis, unspecified site: Secondary | ICD-10-CM | POA: Diagnosis not present

## 2019-10-07 DIAGNOSIS — S61001A Unspecified open wound of right thumb without damage to nail, initial encounter: Secondary | ICD-10-CM | POA: Diagnosis not present

## 2019-10-07 DIAGNOSIS — I69314 Frontal lobe and executive function deficit following cerebral infarction: Secondary | ICD-10-CM | POA: Diagnosis not present

## 2019-10-07 DIAGNOSIS — W19XXXA Unspecified fall, initial encounter: Secondary | ICD-10-CM | POA: Diagnosis not present

## 2019-10-07 DIAGNOSIS — F338 Other recurrent depressive disorders: Secondary | ICD-10-CM | POA: Diagnosis not present

## 2019-10-07 DIAGNOSIS — S42252D Displaced fracture of greater tuberosity of left humerus, subsequent encounter for fracture with routine healing: Secondary | ICD-10-CM | POA: Diagnosis not present

## 2019-10-07 DIAGNOSIS — L989 Disorder of the skin and subcutaneous tissue, unspecified: Secondary | ICD-10-CM | POA: Diagnosis not present

## 2019-10-07 DIAGNOSIS — U071 COVID-19: Secondary | ICD-10-CM | POA: Diagnosis not present

## 2019-10-07 DIAGNOSIS — M2550 Pain in unspecified joint: Secondary | ICD-10-CM | POA: Diagnosis not present

## 2019-10-07 DIAGNOSIS — L03011 Cellulitis of right finger: Secondary | ICD-10-CM | POA: Diagnosis not present

## 2019-10-07 DIAGNOSIS — G459 Transient cerebral ischemic attack, unspecified: Secondary | ICD-10-CM | POA: Diagnosis not present

## 2019-10-07 DIAGNOSIS — I69352 Hemiplegia and hemiparesis following cerebral infarction affecting left dominant side: Secondary | ICD-10-CM | POA: Diagnosis not present

## 2019-10-07 DIAGNOSIS — K219 Gastro-esophageal reflux disease without esophagitis: Secondary | ICD-10-CM | POA: Diagnosis not present

## 2019-10-07 DIAGNOSIS — G8110 Spastic hemiplegia affecting unspecified side: Secondary | ICD-10-CM | POA: Diagnosis not present

## 2019-10-07 DIAGNOSIS — N189 Chronic kidney disease, unspecified: Secondary | ICD-10-CM | POA: Diagnosis not present

## 2019-10-07 DIAGNOSIS — Z20822 Contact with and (suspected) exposure to covid-19: Secondary | ICD-10-CM | POA: Diagnosis not present

## 2019-10-07 DIAGNOSIS — N390 Urinary tract infection, site not specified: Secondary | ICD-10-CM | POA: Diagnosis not present

## 2019-10-07 DIAGNOSIS — R1311 Dysphagia, oral phase: Secondary | ICD-10-CM | POA: Diagnosis not present

## 2019-10-07 DIAGNOSIS — F419 Anxiety disorder, unspecified: Secondary | ICD-10-CM | POA: Diagnosis not present

## 2019-10-07 LAB — GLUCOSE, CAPILLARY
Glucose-Capillary: 175 mg/dL — ABNORMAL HIGH (ref 70–99)
Glucose-Capillary: 170 mg/dL — ABNORMAL HIGH (ref 70–99)

## 2019-10-07 NOTE — Progress Notes (Signed)
[place] PHYSICAL MEDICINE & REHABILITATION PROGRESS NOTE   Subjective/Complaints: No new issues this morning. Slept well. Denies pain  ROS: Limited due to cognitive/behavioral .    Objective:   No results found. Recent Labs    10/05/19 0705  WBC 10.7*  HGB 13.9  HCT 40.8  PLT 405*   No results for input(s): NA, K, CL, CO2, GLUCOSE, BUN, CREATININE, CALCIUM in the last 72 hours.  Intake/Output Summary (Last 24 hours) at 10/07/2019 1216 Last data filed at 10/07/2019 0752 Gross per 24 hour  Intake 920 ml  Output --  Net 920 ml     Physical Exam: Vital Signs Blood pressure (!) 146/61, pulse 97, temperature 97.6 F (36.4 C), temperature source Oral, resp. rate 18, height 5\' 4"  (1.626 m), weight 83.3 kg, SpO2 94 %. Constitutional: No distress . Vital signs reviewed. HEENT: EOMI, oral membranes moist Neck: supple Cardiovascular: RRR without murmur. No JVD    Respiratory/Chest: CTA Bilaterally without wheezes or rales. Normal effort    GI/Abdomen: BS +, non-tender, non-distended Ext: no edema, pulses present Skin: Warm and dry.  Intact. Psych: flat Musc: No edema in extremities.  No tenderness in extremities. Neurological: Alert and oriented to place, month, day, reason she's here. Engages quickly.  STM deficits.    LUE/LLE: 0/5 proximal to distal, significant left inattention. DTR's 3+ on left. Flexor upper tone---MAS 2-3/4, LLE remains trace  Assessment/Plan: 1. Functional deficits secondary to bicortical infarcts, Right frontal SAH which require 3+ hours per day of interdisciplinary therapy in a comprehensive inpatient rehab setting.  Physiatrist is providing close team supervision and 24 hour management of active medical problems listed below.  Physiatrist and rehab team continue to assess barriers to discharge/monitor patient progress toward functional and medical goals  Care Tool:  Bathing  Bathing activity did not occur: Refused Body parts bathed by patient:  Left arm, Chest, Abdomen, Face   Body parts bathed by helper: Front perineal area, Buttocks     Bathing assist Assist Level: 2 Helpers     Upper Body Dressing/Undressing Upper body dressing   What is the patient wearing?: Pull over shirt    Upper body assist Assist Level: Maximal Assistance - Patient 25 - 49%    Lower Body Dressing/Undressing Lower body dressing      What is the patient wearing?: Pants     Lower body assist Assist for lower body dressing: 2 Helpers     Toileting Toileting    Toileting assist Assist for toileting: Dependent - Patient 0%     Transfers Chair/bed transfer  Transfers assist     Chair/bed transfer assist level: Dependent - mechanical lift     Locomotion Ambulation   Ambulation assist   Ambulation activity did not occur: Safety/medical concerns          Walk 10 feet activity   Assist  Walk 10 feet activity did not occur: Safety/medical concerns        Walk 50 feet activity   Assist Walk 50 feet with 2 turns activity did not occur: Safety/medical concerns         Walk 150 feet activity   Assist Walk 150 feet activity did not occur: Safety/medical concerns         Walk 10 feet on uneven surface  activity   Assist Walk 10 feet on uneven surfaces activity did not occur: Safety/medical concerns         Wheelchair     Assist Will patient use wheelchair  at discharge?: No   Wheelchair activity did not occur: N/A         Wheelchair 50 feet with 2 turns activity    Assist    Wheelchair 50 feet with 2 turns activity did not occur: N/A       Wheelchair 150 feet activity     Assist  Wheelchair 150 feet activity did not occur: N/A       Blood pressure (!) 146/61, pulse 97, temperature 97.6 F (36.4 C), temperature source Oral, resp. rate 18, height 5\' 4"  (1.626 m), weight 83.3 kg, SpO2 94 %.  Medical Problem List and Plan: 1.  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 Patient with bilateral cerebral and right occipital infarcts as well as pulmonary emboli with DVT.  -recent MRI revealed progression of bi-frontal infarcts as well as new mall infarcts right lateral occ lobe   -?venous source given PFO and lack of other obvious etiologies. IVCF placed as recommended by neurology, completed by IR on 09/23/2018 right IJ route  Unable to tolerate tizanidine.    -bledsoe elbow brace 0-60 degrees---increase to full extension as tolerated   -bring to office for botox in 3-4 weeks 2. DVT/anticoagulation/Hx of PE: Eliquis             -antiplatelet therapy: N/A 3. Pain Management: Lidoderm patch change as directed  -hydrocodone  D/ced due to ams  -sports cream  -low dose tramadol 25mg  q8 prn  -left RTC/OA shoulder: had left shoulder injection 2 mos ago apparently   -pain worsened by left HP, capsulitis   Consider another injection at some point when more medically stable   -continue shoulder ROM and support  2/25 Right elbow painful with extending d/t tone 4. Mood: Valium as needed for anxiety and depression             -antipsychotic agents: N/A  -Has expressed some anxiety/depression/distraction d/t husband's suicide. Has difficulty disengaging from thoughts at times.    -have requested neuropsych input    -on lexapro 10mg  daily currently 5. Neuropsych: This patient is not fully capable of making decisions on her own behalf. 6. Skin/Wound Care: Routine skin checks 7. Fluids/Electrolytes/Nutrition: encourage PO  Potassium 3.4 again  2/22. Continue supp  Sodium 135 2/22 8.  PFO.  Follow cardiology services.  TEE completed, suggesting PFO, await further Cards recs.. 9.  Hypertension.  Cardizem 120 mg daily, Lasix 20 mg daily  Cozaar 50 mg daily, increased to 75 on 2/22            2/23 some improvement--continue to follow for now  2/26 bp under reasonable control. 10.   Hyperlipidemia: Continue Lipitor 11.  Diabetes mellitus with hyperglycemia.  Hemoglobin A1c 9.4.  SSI.  Patient on Glucophage 1000 mg twice daily prior to admission.  Resume as needed           CBG (last 3)  Recent Labs    10/06/19 2120 10/07/19 0613 10/07/19 1136  GLUCAP 86 175* 170*   Relatively controlled on 2/26  13.  ?  CKD stage II.               BUN/creatinine 13/0.72 14.  Restless leg syndrome.  Requip 2 mg 3 times daily--Initially Reduced to HS d/t lethargy  2/11 home requip resumed d/t improved arousal.  15. Unresponsive episode 2/16   ?seizure    -keppra initiated after episode on 2/16   -EEG demonstrated generalized  slowing, CT without changes  -avoid spasticity medications. Suspect episode was d/t low bp/tizanidine effects   No repeat seizures since initiation of Keppra--d/w neuro re: lower dosing as outpt 15. Leukocytosis: WBC's slowly creeping up.    -no signs of infection   -recheck 2/24 10.7 16. Insomnia: 2/24: added melatonin HS prn. --received at 0100   2/25   scheduled at 9pm  LOS: 23 days A FACE TO [place] 10/07/2019, 12:16 PM

## 2019-10-07 NOTE — Progress Notes (Signed)
Speech Language Pathology Discharge Summary  Patient Details  Name: [name] [name] MRN: [id number] Date of Birth: 1951-06-15  Patient has met 3 of 4 long term goals.  Patient to discharge at overall Max level.   Reasons goals not met: Patient requires Max-Total A for basic problem solving   Clinical Impression/Discharge Summary: Patient has made minimal gains and has met 3 of 4 LTGs this admission. Currently, patient requires overall Max A multimodal cues to complete functional and familiar tasks safely in regards to sustained attention, intellectual awareness and recall with use of visual aids. Max-Total A verbal cues are required for basic problem solving which can be exacerbated by lethargy at times. Patient education is complete and patient's family is unable to provide the level of assistance needed at this time, therefore, patient will discharge to a SNF. Patient would benefit from f/u SLP services to maximize her cognitive functioning in order to reduce caregiver burden.   Care Partner:  Caregiver Able to Provide Assistance: No  Type of Caregiver Assistance: Physical;Cognitive  Recommendation:  Skilled Nursing facility  Rationale for SLP Follow Up: Maximize cognitive function and independence;Reduce caregiver burden   Equipment: N/A   Reasons for discharge: Discharged from hospital   Patient/Family Agrees with Progress Made and Goals Achieved: Yes    [place], [place] 10/07/2019, 6:37 AM

## 2019-10-07 NOTE — Progress Notes (Signed)
Called report to [place] at [facility].

## 2019-10-07 NOTE — Progress Notes (Signed)
Social Work Discharge Note   The overall goal for the admission was met for:   Discharge location: Yes. Discharge to SNF- [facility] [phone]  Length of Stay: Yes. 23 days  Discharge activity level: Yes. Max asst +2  Home/community participation: Yes. Limited.   Services provided included: MD, RD, PT, OT, SLP, RN, CM, TR, Pharmacy, Neuropsych and SW  Financial Services: Medicare  Follow-up services arranged: Other: N/A- SNF  Comments (or additional information): Pt son [name] [phone]  Patient/Family verbalized understanding of follow-up arrangements: Yes  Individual responsible for coordination of the follow-up plan: Pt son [name] will continue to assist pt with coordinating care.   Confirmed correct DME delivered: Rana Snare 10/07/2019    [name], MSW, [place] Office: [phone] Cell: [phone] Fax: [phone]

## 2019-10-07 NOTE — Consult Note (Signed)
   [facility] CM Inpatient Consult   10/07/2019  [name] [name] 03-31-1951 [id number]    Follow- Up Note:   Patient is being followed for disposition and transitional needs for high risk score of unplanned readmission and hospitalizations as a benefit from her Medicare/ Next[employer] plan.  Patient was active with [facility]) SW (for Advance Directives) and RN CM (EMMI Stroke follow-up calls) prior to hospital admission.  Inpatient social work note reviewed, shows that patient is transitioning to skilled nursing facility (SNF: [place]).  Patient'sprimary care provider Samule Dry, MD.  No identifiable [facility] Care Management needs at this time.  Plan:  [facility] post acute RN coordinator will be made aware of patient's disposition to facility for post discharge follow up of needs.   For questionsand referral,please call:   Edwena Felty A. Phelicia Dantes, BSN, RN-BC [facility] Liaison Cell: [phone]

## 2019-10-07 NOTE — Progress Notes (Signed)
Social Work Patient ID: [name] [name], female   DOB: [birth year], 69 y.o.   MRN: [id number]   SW spoke with [name] [name]/Admissions with [name] [name] [phone]) to confirm pt discharge today. Pt going to [facility]/ Rm [place] Nurse report [phone].   SW called pt son [name] [name] [phone]) to inform on above.    SW provided updates/discharge packet to nursing.    [name] [name], MSW, [place] Office: [phone] Cell: [phone] Fax: [phone]

## 2019-10-07 NOTE — Progress Notes (Signed)
Occupational Therapy Discharge Summary  Patient Details  Name: [name] [name] MRN: [id number] Date of Birth: 1951-02-26   Patient has met 4 of 9 long term goals due to improved balance, postural control, functional use of  LEFT upper extremity and improved awareness.  Patient to discharge at [facility] Max Assist level.  Patient's care partner is independent to provide the necessary physical and cognitive assistance at discharge.    Reasons goals not met: Pt did not meet dressing, toileting or transfer goals d/t impairments resulting from extension of CVA while in rehab. Pt continues to profound deficits in perception, vision, cognition, sensation and increase tone/pushing impacting performance of ADLs and transfers  Recommendation:  Patient will benefit from ongoing skilled OT services in skilled nursing facility setting to continue to advance functional skills in the area of BADL and Reduce care partner burden.  Equipment: No equipment provided  Reasons for discharge: discharge from hospital  Patient/family agrees with progress made and goals achieved: Yes  OT Discharge Precautions/Restrictions  Precautions Precautions: Fall Precaution Comments: recent L humeral fx, no orders Restrictions Weight Bearing Restrictions: No General   Vital Signs  Pain   ADL ADL Eating: Minimal assistance Grooming: Minimal assistance Upper Body Bathing: Maximal assistance Where Assessed-Upper Body Bathing: Bed level Lower Body Bathing: Dependent Where Assessed-Lower Body Bathing: Bed level Upper Body Dressing: Maximal assistance Where Assessed-Upper Body Dressing: Bed level Lower Body Dressing: Dependent Where Assessed-Lower Body Dressing: Bed level Toileting: Dependent Where Assessed-Toileting: (bedpan) Toilet Transfer: Dependent(stedy +2) Toilet Transfer Equipment: Bedside commode Vision Baseline Vision/History: Wears glasses Wears Glasses: At all times Patient Visual Report: No  change from baseline Vision Assessment?: Yes Eye Alignment: Within Functional Limits Ocular Range of Motion: Restricted on the left Alignment/Gaze Preference: Head turned;Gaze right Tracking/Visual Pursuits: Decreased smoothness of eye movement to LEFT superior field;Decreased smoothness of eye movement to LEFT inferior field;Requires cues, head turns, or add eye shifts to track Visual Fields: No apparent deficits Perception  Perception: Impaired Inattention/Neglect: Does not attend to left side of body;Does not attend to left visual field Praxis Praxis Impairment Details: Ideomotor;Motor planning;Initiation Cognition Overall Cognitive Status: Impaired/Different from baseline Arousal/Alertness: Lethargic Orientation Level: Oriented to person;Oriented to place Attention: Sustained Sustained Attention: Impaired Memory: Impaired Awareness: Impaired Problem Solving: Impaired Sensation Sensation Light Touch: Appears Intact Coordination Gross Motor Movements are Fluid and Coordinated: No Fine Motor Movements are Fluid and Coordinated: No Motor  Motor Motor: Hemiplegia;Motor apraxia;Abnormal tone;Abnormal postural alignment and control Motor - Skilled Clinical Observations: L hemi, motor apraxia L side > R side Motor - Discharge Observations: Dense L hemi, motor apraxia, L pushing, increased L hamstring tone, flexor synergy RUE Mobility  Bed Mobility Rolling Left: Moderate Assistance - Patient 50-74% Supine to Sit: Total Assistance - Patient < 25% Sit to Supine: Total Assistance - Patient < 25% Transfers Sit to Stand: Total Assistance - Patient < 25% Stand to Sit: Total Assistance - Patient < 25%  Trunk/Postural Assessment  Cervical Assessment Cervical Assessment: (head forward, gaze R) Thoracic Assessment Thoracic Assessment: (kyphotic) Lumbar Assessment Lumbar Assessment: (posterior pelvic tilt) Postural Control Postural Control: Deficits on evaluation(post bias/pelvic  tile, L Lean/push)  Balance Static Sitting Balance Static Sitting - Level of Assistance: 4: Min assist Dynamic Sitting Balance Dynamic Sitting - Level of Assistance: 1: +1 Total assist Extremity/Trunk Assessment RUE Assessment RUE Assessment: Within Functional Limits LUE Assessment LUE Assessment: Exceptions to [facility] General Strength Comments: incr flexor tone, extension spilnt to decrease risk of contracture; LUE Body System: Neuro Brunstrum levels  for arm and hand: Arm;Hand Brunstrum level for arm: Stage II Synergy is developing Brunstrum level for hand: Stage II Synergy is developing   Tonny Branch 10/07/2019, 6:42 PM

## 2019-10-07 NOTE — Plan of Care (Signed)
VS reported, lunch packed for when arrives at [facility].

## 2019-10-10 DIAGNOSIS — I6389 Other cerebral infarction: Secondary | ICD-10-CM | POA: Diagnosis not present

## 2019-10-10 DIAGNOSIS — I2694 Multiple subsegmental pulmonary emboli without acute cor pulmonale: Secondary | ICD-10-CM | POA: Diagnosis not present

## 2019-10-10 DIAGNOSIS — E119 Type 2 diabetes mellitus without complications: Secondary | ICD-10-CM | POA: Diagnosis not present

## 2019-10-10 DIAGNOSIS — I10 Essential (primary) hypertension: Secondary | ICD-10-CM | POA: Diagnosis not present

## 2019-10-13 ENCOUNTER — Other Ambulatory Visit: Payer: Self-pay | Admitting: *Deleted

## 2019-10-13 NOTE — Patient Outreach (Signed)
Member screened for potential [facility] Care Management needs as a benefit of [place] [employer].  [name] [name] is currently receiving skilled therapy at [facility]. She was active with [facility] Management team prior to hospitalization.  Spoke with [facility] UM RN after her telephonic IDT meeting with [facility] staff. Member with confusion. Mostly dependent with therapy. Reportedly facility has made son aware that member will require 24/7 upon SNF dc. Reportedly facility has encouraged son to apply for Medicaid. Tentative transition plan is for home.   Will plan outreach to member's son to discuss transition plans. Will continue to follow while member resides in SNF.    Marthenia Rolling, MSN-Ed, RN,BSN [facility] Acute Care Coordinator [phone] [facility]) [phone]  (Toll free office)

## 2019-10-14 DIAGNOSIS — R799 Abnormal finding of blood chemistry, unspecified: Secondary | ICD-10-CM | POA: Diagnosis not present

## 2019-10-14 DIAGNOSIS — N390 Urinary tract infection, site not specified: Secondary | ICD-10-CM | POA: Diagnosis not present

## 2019-10-14 DIAGNOSIS — I2694 Multiple subsegmental pulmonary emboli without acute cor pulmonale: Secondary | ICD-10-CM | POA: Diagnosis not present

## 2019-10-14 DIAGNOSIS — I6389 Other cerebral infarction: Secondary | ICD-10-CM | POA: Diagnosis not present
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
## 2019-10-19 ENCOUNTER — Telehealth: Payer: Self-pay | Admitting: Family Medicine

## 2019-10-19 ENCOUNTER — Encounter: Payer: Self-pay | Admitting: Cardiology

## 2019-10-19 ENCOUNTER — Other Ambulatory Visit: Payer: Self-pay

## 2019-10-19 ENCOUNTER — Ambulatory Visit (INDEPENDENT_AMBULATORY_CARE_PROVIDER_SITE_OTHER): Payer: Medicare Other | Admitting: Cardiology

## 2019-10-19 ENCOUNTER — Telehealth: Payer: Self-pay | Admitting: Nurse Practitioner

## 2019-10-19 VITALS — BP 102/54 | HR 103 | Ht 64.0 in | Wt 177.3 lb

## 2019-10-19 DIAGNOSIS — I2699 Other pulmonary embolism without acute cor pulmonale: Secondary | ICD-10-CM | POA: Diagnosis not present

## 2019-10-19 DIAGNOSIS — I10 Essential (primary) hypertension: Secondary | ICD-10-CM

## 2019-10-19 DIAGNOSIS — Z79899 Other long term (current) drug therapy: Secondary | ICD-10-CM | POA: Diagnosis not present

## 2019-10-19 DIAGNOSIS — I639 Cerebral infarction, unspecified: Secondary | ICD-10-CM | POA: Diagnosis not present

## 2019-10-19 DIAGNOSIS — R799 Abnormal finding of blood chemistry, unspecified: Secondary | ICD-10-CM | POA: Diagnosis not present

## 2019-10-19 DIAGNOSIS — Q2112 Patent foramen ovale: Secondary | ICD-10-CM

## 2019-10-19 DIAGNOSIS — Q211 Atrial septal defect: Secondary | ICD-10-CM

## 2019-10-19 DIAGNOSIS — E876 Hypokalemia: Secondary | ICD-10-CM | POA: Diagnosis not present

## 2019-10-19 DIAGNOSIS — G2581 Restless legs syndrome: Secondary | ICD-10-CM | POA: Diagnosis not present

## 2019-10-19 LAB — CBC
Hematocrit: 43.3 % (ref 34.0–46.6)
Hemoglobin: 15 g/dL (ref 11.1–15.9)
MCH: 30 pg (ref 26.6–33.0)
MCHC: 34.6 g/dL (ref 31.5–35.7)
MCV: 87 fL (ref 79–97)
Platelets: 525 10*3/uL — ABNORMAL HIGH (ref 150–450)
RBC: 5 x10E6/uL (ref 3.77–5.28)
RDW: 14.5 % (ref 11.7–15.4)
WBC: 11.1 10*3/uL — ABNORMAL HIGH (ref 3.4–10.8)

## 2019-10-19 LAB — BASIC METABOLIC PANEL
BUN/Creatinine Ratio: 29 — ABNORMAL HIGH (ref 12–28)
BUN: 24 mg/dL (ref 8–27)
CO2: 22 mmol/L (ref 20–29)
Calcium: 10.1 mg/dL (ref 8.7–10.3)
Chloride: 96 mmol/L (ref 96–106)
Creatinine, Ser: 0.82 mg/dL (ref 0.57–1.00)
GFR calc Af Amer: 84 mL/min/{1.73_m2} (ref >59)
GFR calc non Af Amer: 73 mL/min/{1.73_m2} (ref >59)
Glucose: 155 mg/dL — ABNORMAL HIGH (ref 65–99)
Potassium: 2.8 mmol/L — CL (ref 3.5–5.2)
Sodium: 140 mmol/L (ref 134–144)

## 2019-10-19 NOTE — Telephone Encounter (Signed)
   I received a call from [facility] re: critical potassium of 2.8.  I called [address] @ [phone] and spoke to the nurse taking care of [name].  I was able to provide a verbal order for Kdur 28meq x 1 now, repeat in 2 hrs followed by kdur 49meq daily starting tomorrow.  F/u bmet in 1 week.    Lab Results  Component Value Date   CREATININE 0.82 10/19/2019   BUN 24 10/19/2019   NA 140 10/19/2019   K 2.8 (LL) 10/19/2019   CL 96 10/19/2019   CO2 22 10/19/2019    Lab Results  Component Value Date   WBC 11.1 (H) 10/19/2019   HGB 15.0 10/19/2019   HCT 43.3 10/19/2019   MCV 87 10/19/2019   PLT 525 (H) 10/19/2019     RN taking orders verbalized understanding.  Murray Hodgkins, NP 10/19/2019, 5:58 PM

## 2019-10-19 NOTE — Patient Instructions (Signed)
Medication Instructions:   Your physician recommends that you continue on your current medications as directed. Please refer to the Current Medication list given to you today.  *If you need a refill on your cardiac medications before your next appointment, please call your pharmacy*  Lab Work:  You will have labs drawn today: BMET/CBC  If you have labs (blood work) drawn today and your tests are completely normal, you will receive your results only by: Marland Kitchen MyChart Message (if you have MyChart) OR . A paper copy in the mail If you have any lab test that is abnormal or we need to change your treatment, we will call you to review the results.  Testing/Procedures:  None ordered today  Follow-Up: At [facility], you and your health needs are our priority.  As part of our continuing mission to provide you with exceptional heart care, we have created designated Provider Care Teams.  These Care Teams include your primary Cardiologist (physician) and Advanced Practice Providers (APPs -  Physician Assistants and Nurse Practitioners) who all work together to provide you with the care you need, when you need it.  We recommend signing up for the patient portal called "MyChart".  Sign up information is provided on this After Visit Summary.  MyChart is used to connect with patients for Virtual Visits (Telemedicine).  Patients are able to view lab/test results, encounter notes, upcoming appointments, etc.  Non-urgent messages can be sent to your provider as well.   To learn more about what you can do with MyChart, go to [email or web address].    Your next appointment:   4-5 month(s)  The format for your next appointment:   In Person  Provider:   Sherren Mocha, MD
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
## 2019-10-20 DIAGNOSIS — E876 Hypokalemia: Secondary | ICD-10-CM | POA: Diagnosis not present

## 2019-10-20 DIAGNOSIS — S4292XD Fracture of left shoulder girdle, part unspecified, subsequent encounter for fracture with routine healing: Secondary | ICD-10-CM | POA: Diagnosis not present

## 2019-10-20 DIAGNOSIS — Z79899 Other long term (current) drug therapy: Secondary | ICD-10-CM | POA: Diagnosis not present

## 2019-10-20 NOTE — Telephone Encounter (Signed)
Id be glad to call him, what number do I call?

## 2019-10-20 NOTE — Telephone Encounter (Signed)
You can reach him at [phone].

## 2019-10-21 DIAGNOSIS — E876 Hypokalemia: Secondary | ICD-10-CM | POA: Diagnosis not present

## 2019-10-21 DIAGNOSIS — G2581 Restless legs syndrome: Secondary | ICD-10-CM | POA: Diagnosis not present

## 2019-10-26 ENCOUNTER — Telehealth: Payer: Self-pay | Admitting: Hematology

## 2019-10-26 NOTE — Telephone Encounter (Signed)
Rescheduled appt per MD PAL. I tried to call both numbers on file. The home phone number does not have an answering maching, and her mobil number voicemail box was full. Sent reminder letter with calendar. Will try to call again.

## 2019-10-27 DIAGNOSIS — R799 Abnormal finding of blood chemistry, unspecified: Secondary | ICD-10-CM | POA: Diagnosis not present

## 2019-10-27 DIAGNOSIS — I6389 Other cerebral infarction: Secondary | ICD-10-CM | POA: Diagnosis not present

## 2019-10-28 ENCOUNTER — Other Ambulatory Visit: Payer: Self-pay | Admitting: *Deleted

## 2019-10-28 NOTE — Patient Outreach (Addendum)
[place] Coordinator follow up  [name] [name] remains in [facility] SNF receiving skilled therapy.  Telephone call made to [name] [name] (son) [phone]. Patient identifiers confirmed. [name] [name] endorses that he understands his mother will not be able to return home. States the plan is for member to remain for long term care. [name] [name] states his mother has [place] as secondary but does not have Medicaid. Asked [name] [name] if he needed information regarding applying for Medicaid. [name] [name] denies writer's assistance. [name] [name] endorses that he is working with facility regarding details for long term care placement.  [name] [name] Medical sales representative for call. Writer to provide contact information via email.  Will continue to follow while member resides in SNF.   Addendum: 1634pm telephone call made to facility social worker to gain clarification. SW not in today. Telephone call back to [name] [name] who states that he was told by facility that "[facility] will take care of everything." [name] [name] agreeable to Probation officer emailing him a Medicaid application and requirements just in case he will need Medicaid after all.    [name] Rolling, MSN-Ed, RN,BSN [place] Acute Care Coordinator [phone] [facility]) [phone]  (Toll free office)

## 2019-10-30 ENCOUNTER — Other Ambulatory Visit: Payer: Self-pay | Admitting: Family Medicine

## 2019-11-01 DIAGNOSIS — R230 Cyanosis: Secondary | ICD-10-CM | POA: Diagnosis not present

## 2019-11-01 DIAGNOSIS — I959 Hypotension, unspecified: Secondary | ICD-10-CM | POA: Diagnosis not present

## 2019-11-02 DIAGNOSIS — I959 Hypotension, unspecified: Secondary | ICD-10-CM | POA: Diagnosis not present

## 2019-11-02 DIAGNOSIS — R230 Cyanosis: Secondary | ICD-10-CM | POA: Diagnosis not present

## 2019-11-03 ENCOUNTER — Encounter: Payer: Medicare Other | Admitting: Hematology

## 2019-11-03 ENCOUNTER — Telehealth: Payer: Self-pay | Admitting: *Deleted

## 2019-11-03 ENCOUNTER — Other Ambulatory Visit: Payer: Self-pay | Admitting: *Deleted

## 2019-11-03 DIAGNOSIS — R230 Cyanosis: Secondary | ICD-10-CM | POA: Diagnosis not present

## 2019-11-03 DIAGNOSIS — I10 Essential (primary) hypertension: Secondary | ICD-10-CM | POA: Diagnosis not present

## 2019-11-03 DIAGNOSIS — E119 Type 2 diabetes mellitus without complications: Secondary | ICD-10-CM | POA: Diagnosis not present

## 2019-11-03 NOTE — Telephone Encounter (Signed)
Called [name] [name].  Wrote prescription for wheelchair on my desk and he knows to pick up.

## 2019-11-03 NOTE — Telephone Encounter (Signed)
Received VM from patient son, [name] [name].   Reports that he has a few concerns about patient .   Contacted patient to inquire.   Reports that patient is currently in rehab facility. States that he was advised to get [facility] for patient to return home. Requested order for WC. Advised that we would need to see patient for a Face to Face and have supporting documentation in chart notes before insurance will cover.  Patient noted to be upset. States that he was advised by PCP to contact him with any concerns, but he is unable to leave a message with PCP. States that he would like to talk with PCP today if possible.   No availability on today's schedule noted. MD please advise.

## 2019-11-03 NOTE — Patient Outreach (Signed)
Screened for potential [facility] Care Management needs as a benefit of  [employer] Medicare.  [name] is currently receiving skilled therapy at [facility].   Writer attended telephonic interdisciplinary team meeting to assess for disposition needs and transition plan for resident.   Facility reports the transition plan is for member to return home. However, it appears to be an unrealistic dc plan. Member will require a tilt/space specialized wheelchair. She remains total to extensive care with therapy. Facility SW states she sent [name] [name] information to the son. Discussed that son has not filled out application or has he completed a Medicaid application as of last week per writer's conversation with him.   Writer will plan to outreach member's son [name] [name] again to discuss his plans for member upon SNF dc.    [name] Rolling, MSN-Ed, RN,BSN [place] Acute Care Coordinator [phone] [facility]) [phone]  (Toll free office)

## 2019-11-07 ENCOUNTER — Other Ambulatory Visit: Payer: Self-pay | Admitting: *Deleted

## 2019-11-07 NOTE — Patient Outreach (Signed)
[facility] Post- Acute Care Coordinator follow up  Mrs. Jen remains at [facility] SNF receiving skilled therapy.   Telephone call made to Bertina Ogle (son) [phone] to follow up on disposition plans and the status of him applying for Medicaid and VA benefits. John states it not a good time to talk. Asked that writer call him back another time closer to 5 or after.   Will plan outreach at later time per son's request.  Will continue to follow while member resides in SNF.  Marthenia Rolling, MSN-Ed, RN,BSN [place] Acute Care Coordinator [phone] [facility]) [phone]  (Toll free office)

## 2019-11-09 ENCOUNTER — Other Ambulatory Visit: Payer: Self-pay | Admitting: *Deleted

## 2019-11-09 NOTE — Patient Outreach (Signed)
[facility] Post Acute Care Coordinator follow up  Telephone call made to Danaysia Scavone (son) [phone]. However, [name] states he is still at work and cannot talk. Requests that writer call him back again tomorrow after 430pm.  Will plan outreach again tomorrow to discuss transition plans and potential [facility] needs.    Marthenia Rolling, MSN-Ed, RN,BSN [place] Acute Care Coordinator [phone] [facility]) [phone]  (Toll free office)

## 2019-11-10 ENCOUNTER — Other Ambulatory Visit: Payer: Self-pay | Admitting: *Deleted

## 2019-11-10 DIAGNOSIS — L989 Disorder of the skin and subcutaneous tissue, unspecified: Secondary | ICD-10-CM | POA: Diagnosis not present

## 2019-11-10 NOTE — Patient Outreach (Signed)
[facility] Post- Acute Care Coordinator follow up  [name] [name] remains at [facility].  Telephone call made to [name] (son) at the end of the day as requested. No answer. Left HIPAA compliant voicemail message requesting call back.  Facility reports disposition plan is to return home. Writer has made multiple unsuccessful outreaches to discuss transition plans and [facility] needs with son.  Will continue to follow for transition plans while member resides in SNF. Will attempt to reach son again at later time.   Marthenia Rolling, MSN-Ed, RN,BSN [place] Acute Care Coordinator [phone] [facility]) [phone]  (Toll free office)

## 2019-11-11 DIAGNOSIS — I6389 Other cerebral infarction: Secondary | ICD-10-CM | POA: Diagnosis not present

## 2019-11-11 DIAGNOSIS — J449 Chronic obstructive pulmonary disease, unspecified: Secondary | ICD-10-CM | POA: Diagnosis not present

## 2019-11-11 DIAGNOSIS — I2694 Multiple subsegmental pulmonary emboli without acute cor pulmonale: Secondary | ICD-10-CM | POA: Diagnosis not present

## 2019-11-11 DIAGNOSIS — I10 Essential (primary) hypertension: Secondary | ICD-10-CM | POA: Diagnosis not present

## 2019-11-14 DIAGNOSIS — K1379 Other lesions of oral mucosa: Secondary | ICD-10-CM | POA: Diagnosis not present

## 2019-11-15 ENCOUNTER — Other Ambulatory Visit: Payer: Self-pay | Admitting: *Deleted

## 2019-11-15 NOTE — Patient Outreach (Signed)
[place] Coordinator follow up  Mrs. Bloch remains at [facility] for skilled therapy.   Telephone call made to [facility] son [name] [phone]. John states he has completed and "sent off" applications for Medicaid and [employer]. States he recently purchased a specialized wheelchair [employer]site ge[employer]) for his mother that costs 3,000 dollars. Reports he has a couple of agencies he is going to call for private caregiver assistance. However, [name] affirms he has not been told when his mother is discharging from rehab. States, "it will be a while because she cannot walk."   Discussed [facility] Care Management services and Remote [facility] if member ultimately returns home. [name] is agreeable. Writer to send [place] Management information and Remote [facility] flier via email.   Will plan to make appropriate [facility] Care Management and [facility] referrals closer to SNF dc.    Marthenia Rolling, MSN-Ed, RN,BSN [place] Acute Care Coordinator [phone] [facility]) [phone]  (Toll free office)

## 2019-11-17 ENCOUNTER — Ambulatory Visit (INDEPENDENT_AMBULATORY_CARE_PROVIDER_SITE_OTHER): Payer: Medicare Other | Admitting: Neurology

## 2019-11-17 ENCOUNTER — Encounter: Payer: Medicare Other | Admitting: Hematology

## 2019-11-17 ENCOUNTER — Other Ambulatory Visit: Payer: Self-pay | Admitting: *Deleted

## 2019-11-17 ENCOUNTER — Encounter: Payer: Self-pay | Admitting: Neurology

## 2019-11-17 ENCOUNTER — Other Ambulatory Visit: Payer: Self-pay

## 2019-11-17 VITALS — BP 145/62 | HR 90 | Temp 97.6°F | Ht 69.0 in

## 2019-11-17 DIAGNOSIS — I639 Cerebral infarction, unspecified: Secondary | ICD-10-CM

## 2019-11-17 DIAGNOSIS — G8110 Spastic hemiplegia affecting unspecified side: Secondary | ICD-10-CM

## 2019-11-17 NOTE — Patient Outreach (Signed)
Screened for potential [facility] Care Management needs as a benefit of  [employer].  [name] [name] is currently receiving skilled therapy at [facility].  Writer attended telephonic interdisciplinary team meeting to assess for disposition needs and transition plan for resident.   Facility states [name] [name] transition plan is now to remain at [facility] for long term care. Reports palliative care referral is pending.  Will follow up to see if [facility] palliative has received referral.   [name] Rolling, MSN-Ed, RN,BSN [place] Acute Care Coordinator [phone] [facility]) [phone]  (Toll free office)

## 2019-11-17 NOTE — Progress Notes (Signed)
[facility] [address] [place]. [place] [address] [phone]       OFFICE FOLLOW-UP NOTE  [name] Date of Birth:  [birth year] Medical Record Number:  [id number]   HPI: [name] [name] is a 69 year old Caucasian lady seen today for initial office follow-up visit following hospital consultation for recurrent strokes in January as well as February 2021.  She has a past medical history of diabetes, hypertension, hyperlipidemia, coronary artery disease chronic renal insufficiency.  She was initially seen in consultation on 08/19/2019 when she developed sudden onset of left leg weakness and difficulty walking due to dragging.  She presented beyond time window for thrombolysis.  MRI scan showed multiple bilateral scattered embolic strokes more on the right than the left side and a pseudowatershed distribution.  CT angiogram did not show any significant extracranial or intracranial stenosis.  There was some aortic arch and proximal right subclavian stenosis noted.  Extracranial carotid Doppler showed no significant stenosis.  2D echo showed normal ejection fraction.  LDL cholesterol 69 mg percent and hemoglobin A1c was elevated at 9.4.  She is started on dual antiplatelet therapy.  She developed increased left leg weakness on 09/07/2019 while visiting in orthopedic office and code stroke was called.  However she stated when evaluated by stroke team that her weakness was actually a few days old and CT scan was obtained which showed a recent multifocal subacute watershed infarcts bilaterally without acute finding.  She returned again on 09/11/2019 with again increased left leg weakness.  She was found to previously have pulmonary embolism during admission on 08/18/2019 and was started on Xarelto on 08/24/2019.  A PFO was noted on TCD bubble study during the second admission she was found to be hypotensive and received fluid boluses and had a repeat MRI scan this time showed an additional new  right parietal embolic infarcts there is also a small subarachnoid hemorrhage in the right convexity.  Xarelto was discontinued and she was subsequently switched to Eliquis.  She had transesophageal echocardiogram done which showed no cardiac source of embolism or clot.  She is transferred to inpatient rehab where again on 09/22/2019 she was noted to be lethargic with right gaze preference and left-sided neglect.  No definite seizure activity was noted.  No acute abnormality was noted on the CT scan.  CT scan of the abdomen and pelvis showed no malignancy on 09/22/2019 CT chest on 08/24/2019 had shown segmental and subsequent segmental pulmonary embolism.  Lower extremity venous Doppler on 08/30/2019 had shown right lower extremity peroneal vein deep vein thrombosis of indeterminate age.  On 09/27/2019 neuro hospitalist again reconsulted when [name] while being transferred with a [facility] lift became unresponsive and her blood pressure dropped to 60 systolic she had left gaze deviation and some nystagmus.  She became gradually responsive and she was laid flat in the bed and was able to answer questions.  She was given 1 g Keppra load and started on it for presumed seizures even though the EEG was obtained on 09/28/2019 which showed no definite seizure activity but moderate to severe diffuse slowing bilaterally.  [name] is presently in [place] facility.  She has not made much physical progress and continues to have severe spastic left hemiplegia.  She also has a lot of pain in her left shoulder.  She is seeing Dr. Tessa Lerner for Botox injections soon.  She has shown cognitive improvement and is interactive and can speak but tends not to speak unless she is  spoken to.  She is getting physical occupational therapy but is not making much progress as per her [name].  ROS:   14 system review of systems is positive for weakness, pain, stiffness, gait difficulty, poor balance and all other systems negative  PMH:    Past Medical History:  Diagnosis Date  . Aneurysm, thoracic aortic ([place])   . Anxiety   . Arthritis   . CAD (coronary artery disease)   . Chronic kidney disease    Renal insufficiency  . COPD (chronic obstructive pulmonary disease) ([place])   . Depression   . Diabetes mellitus   . Diverticulosis of colon (without mention of hemorrhage)   . Duodenitis without mention of hemorrhage   . Embolic stroke ([place])   . GERD (gastroesophageal reflux disease)   . Hemorrhoids   . Hypercholesteremia   . Hypertension   . Insomnia   . Pulmonary embolism ([place])   . Tinnitus   . Vertigo     Social History:  Social History   Socioeconomic History  . Marital status: Widowed    Spouse name: Not on file  . Number of children: 1  . Years of education: Not on file  . Highest education level: Not on file  Occupational History  . Occupation: Retired  Tobacco Use  . Smoking status: Former Smoker    Types: Cigarettes    Quit date: 08/12/2018    Years since quitting: 1.2  . Smokeless tobacco: Never Used  Substance and Sexual Activity  . Alcohol use: No    Alcohol/week: 0.0 standard drinks  . Drug use: No  . Sexual activity: Not on file  Other Topics Concern  . Not on file  Social History Narrative  . Not on file   Social Determinants of Health   Financial Resource Strain:   . Difficulty of Paying Living Expenses:   Food Insecurity:   . Worried About Charity fundraiser in the Last Year:   . Arboriculturist in the Last Year:   Transportation Needs: No Transportation Needs  . Lack of Transportation (Medical): No  . Lack of Transportation (Non-Medical): No  Physical Activity:   . Days of Exercise per Week:   . Minutes of Exercise per Session:   Stress:   . Feeling of Stress :   Social Connections:   . Frequency of Communication with Friends and Family:   . Frequency of Social Gatherings with Friends and Family:   . Attends Religious Services:   . Active Member of Clubs or  Organizations:   . Attends Archivist Meetings:   Marland Kitchen Marital Status:   Intimate Partner Violence:   . Fear of Current or Ex-Partner:   . Emotionally Abused:   Marland Kitchen Physically Abused:   . Sexually Abused:     Medications:   Current Outpatient Medications on File Prior to Visit  Medication Sig Dispense Refill  . acetaminophen (TYLENOL) 325 MG tablet Take 2 tablets (650 mg total) by mouth every 4 (four) hours as needed for mild pain (or temp > 37.5 C (99.5 F)).    Marland Kitchen albuterol (VENTOLIN HFA) 108 (90 Base) MCG/ACT inhaler Inhale 2 puffs into the lungs every 4 (four) hours as needed for wheezing or shortness of breath. 54 g 3  . apixaban (ELIQUIS) 5 MG TABS tablet Take 5 mg by mouth 2 (two) times daily.    Marland Kitchen atorvastatin (LIPITOR) 20 MG tablet Take 20 mg by mouth at bedtime.    . bimatoprost (  LUMIGAN) 0.01 % SOLN Place 1 drop into both eyes at bedtime.    . brinzolamide (AZOPT) 1 % ophthalmic suspension Place 1 drop into both eyes every 12 (twelve) hours.     Marland Kitchen diltiazem (CARDIZEM) 120 MG tablet Take 120 mg by mouth daily.    Marland Kitchen escitalopram (LEXAPRO) 10 MG tablet Take 1 tablet (10 mg total) by mouth daily. 90 tablet 3  . furosemide (LASIX) 20 MG tablet Take 1 tablet (20 mg total) by mouth daily. 30 tablet   . levETIRAcetam (KEPPRA) 1000 MG tablet Take 1 tablet (1,000 mg total) by mouth 2 (two) times daily.    Marland Kitchen lidocaine (LIDODERM) 5 % Place 1 patch onto the skin daily. Remove & Discard patch within 12 hours or as directed by MD 5 patch 0  . losartan (COZAAR) 25 MG tablet Take 3 tablets (75 mg total) by mouth daily.    . Melatonin 3 MG TABS Take 1 tablet (3 mg total) by mouth at bedtime.  0  . metFORMIN (GLUCOPHAGE) 500 MG tablet Take 1 tablet (500 mg total) by mouth 2 (two) times daily with a meal.    . nitrofurantoin, macrocrystal-monohydrate, (MACROBID) 100 MG capsule Take 100 mg by mouth 2 (two) times daily.    . OXYGEN Inhale 2 L into the lungs at bedtime.     Marland Kitchen rOPINIRole (REQUIP)  2 MG tablet Take 1 tablet (2 mg total) by mouth 3 (three) times daily. 270 tablet 3  . traMADol (ULTRAM) 50 MG tablet Take 0.5 tablets (25 mg total) by mouth every 12 (twelve) hours as needed for severe pain. 5 tablet 0  . tuberculin (TUBERSOL) 5 UNIT/0.1ML injection Inject into the skin every 14 (fourteen) days.    . Vitamin D, Ergocalciferol, (DRISDOL) 1.25 MG (50000 UNIT) CAPS capsule TAKE 1 CAPSULE BY MOUTH ONE TIME PER WEEK 12 capsule 1   No current facility-administered medications on file prior to visit.    Allergies:   Allergies  Allergen Reactions  . Guaifenesin & Derivatives Anaphylaxis    Severe Reaction  . Tape Other (See Comments)    TAPE PEELS OFF THE SKIN!!!!!!!  . Zanaflex [Tizanidine Hcl] Other (See Comments)    Seizure activity  . Benazepril Cough  . Morphine Nausea Only  . Zyrtec [Cetirizine] Swelling and Other (See Comments)    Hands and feet became swollen  . Latex Rash and Other (See Comments)    No powdered gloves!!    Physical Exam General: well developed, well nourished, seated, in no evident distress Head: head normocephalic and atraumatic.  Neck: supple with no carotid or supraclavicular bruits Cardiovascular: regular rate and rhythm, no murmurs Musculoskeletal: no deformity Skin:  no rash/petichiae Vascular:  Normal pulses all extremities Vitals:   11/17/19 1310  BP: (!) 145/62  Pulse: 90  Temp: 97.6 F (36.4 C)   Neurologic Exam Mental Status: Awake and fully alert. Oriented to place and time. Recent and remote memory intact. Attention span, concentration and fund of knowledge appropriate. Mood and affect appropriate.  Mild dysarthria.  Follows simple 1 and two-step commands. Cranial Nerves: Fundoscopic exam reveals sharp disc margins. Pupils equal, briskly reactive to light. Extraocular movements show forced right gaze deviation and unable to look to the left.  Visual fields show dense left homonymous hemianopsia l to confrontation. Hearing  intact. Facial sensation intact.  Mild left lower facial weakness.  Tongue, palate moves normally and symmetrically.  Motor: Spastic left hemiplegia with 0/5 left upper and lower extremity strength  with increased tone and pain on passive movements.  Left shoulder tenderness.  Normal strength on the right side. Sensory.: intact to touch ,pinprick .position and vibratory sensation but slightly diminished on the left compared to the right.  Coordination: Rapid alternating movements normal in all extremities. Finger-to-nose and heel-to-shin performed accurately bilaterally. Gait and Station: Unable to test as [name] is bedridden and cannot even sit up with assistance or walk Reflexes: 2+ and asymmetric and brisker on the left. Toes downgoing.   NIHSS  15 Modified Rankin 4  ASSESSMENT: 69 year old Caucasian lady with multiple bicerebral embolic infarcts in January and February 2021 of cryptogenic etiology in the setting of deep vein thrombosis and pulmonary embolism and PFO and possibly paradoxical mechanism.     PLAN: I had a long discussion with the [name] [name] regarding her recent admissions for bilateral multiple strokes and answered questions.  Unfortunately she is not obtaining any significant improvement yet and has residual left gaze palsy, visual field loss and spastic left hemiplegia and is bedridden.  I recommend continue ongoing physical and occupational therapy and Eliquis for stroke prevention given history of DVT and pulmonary embolism.  Continue Keppra for seizure prophylaxis for now but repeat EEG study and if negative may consider reducing the dose and stopping in the future as documentation of definite seizure is lacking.  She will return for follow-up in the future in 3 months with my nurse practitioner call earlier if necessary Greater than 50% of time during this 30 minute visit was spent on counseling,explanation of diagnosis, planning of further management, discussion  with [name] and family and coordination of care Antony Contras, MD  [facility] [address] [place], [place] [address]  Phone [phone] Fax [phone] Note: This document was prepared with digital dictation and possible smart phrase technology. Any transcriptional errors that result from this process are unintentional

## 2019-11-17 NOTE — Patient Instructions (Signed)
I had a long discussion with the patient and her son regarding her recent admissions for bilateral multiple strokes and answered questions.  Unfortunately she is not obtaining any significant improvement yet and has residual left gaze palsy, visual field loss and spastic left hemiplegia and is bedridden.  I recommend continue ongoing physical and occupational therapy and Eliquis for stroke prevention given history of DVT and pulmonary embolism.  Continue Keppra for seizure prophylaxis for now but repeat EEG study and if negative may consider reducing the dose and stopping in the future.  She will return for follow-up in the future in 3 months with my nurse practitioner call earlier if necessary

## 2019-11-18 ENCOUNTER — Ambulatory Visit: Payer: Medicare Other | Admitting: Family Medicine

## 2019-11-18 DIAGNOSIS — S42252D Displaced fracture of greater tuberosity of left humerus, subsequent encounter for fracture with routine healing: Secondary | ICD-10-CM | POA: Diagnosis not present

## 2019-11-18 DIAGNOSIS — K1379 Other lesions of oral mucosa: Secondary | ICD-10-CM | POA: Diagnosis not present

## 2019-11-18 DIAGNOSIS — R1310 Dysphagia, unspecified: Secondary | ICD-10-CM | POA: Diagnosis not present

## 2019-11-18 NOTE — Telephone Encounter (Signed)
Cancelled Order 

## 2019-11-21 DIAGNOSIS — K1379 Other lesions of oral mucosa: Secondary | ICD-10-CM | POA: Diagnosis not present

## 2019-11-21 DIAGNOSIS — I10 Essential (primary) hypertension: Secondary | ICD-10-CM | POA: Diagnosis not present

## 2019-11-22 ENCOUNTER — Inpatient Hospital Stay: Payer: Medicare Other

## 2019-11-22 ENCOUNTER — Inpatient Hospital Stay: Payer: Medicare Other | Attending: Hematology | Admitting: Hematology

## 2019-11-22 ENCOUNTER — Other Ambulatory Visit: Payer: Self-pay

## 2019-11-22 NOTE — Progress Notes (Incomplete)
HEMATOLOGY/ONCOLOGY CONSULTATION NOTE  Date of Service: 11/22/2019  Patient Care Team: Susy Frizzle, MD as PCP - General (Family Medicine)  CHIEF COMPLAINTS/PURPOSE OF CONSULTATION:  DVT  HISTORY OF PRESENTING ILLNESS:  [name] [name] is a wonderful 69 y.o. female who has been referred to [place] by Dr Merryl Hacker for evaluation and management of DVT. She is accompanied today by ***. The pt reports that she is doing well overall.  The pt reports ***   Of note prior to the patient's visit today, pt has had *** completed on *** with results revealing ***.   Most recent lab results (***) of CBC is as follows: all values are WNL except for ***.  On review of systems, pt reports *** and denies ***and any other symptoms.   On PMHx the pt reports ***. On Social Hx the pt reports *** On Family Hx the pt reports ***  A: -Discussed patient's most recent labs from ***, *** -***  MEDICAL HISTORY:  Past Medical History:  Diagnosis Date  . Aneurysm, thoracic aortic ([place])   . Anxiety   . Arthritis   . CAD (coronary artery disease)   . Chronic kidney disease    Renal insufficiency  . COPD (chronic obstructive pulmonary disease) ([place])   . Depression   . Diabetes mellitus   . Diverticulosis of colon (without mention of hemorrhage)   . Duodenitis without mention of hemorrhage   . Embolic stroke ([place])   . GERD (gastroesophageal reflux disease)   . Hemorrhoids   . Hypercholesteremia   . Hypertension   . Insomnia   . Pulmonary embolism ([place])   . Tinnitus   . Vertigo     SURGICAL HISTORY: Past Surgical History:  Procedure Laterality Date  . ANGIOPLASTY    . BUBBLE STUDY  09/14/2019   Procedure: BUBBLE STUDY;  Surgeon: Donato Heinz, MD;  Location: [facility];  Service: Endoscopy;;  . CHOLECYSTECTOMY    . IR IVC FILTER PLMT / S&I /IMG GUID/MOD SED  09/24/2019  . ROTATOR CUFF REPAIR Right 04/2012  . TEE WITHOUT CARDIOVERSION N/A 09/14/2019   Procedure: TRANSESOPHAGEAL  ECHOCARDIOGRAM (TEE);  Surgeon: Donato Heinz, MD;  Location: [facility] ENDOSCOPY;  Service: Endoscopy;  Laterality: N/A;  . TUBAL LIGATION      SOCIAL HISTORY: Social History   Socioeconomic History  . Marital status: Widowed    Spouse name: Not on file  . Number of children: 1  . Years of education: Not on file  . Highest education level: Not on file  Occupational History  . Occupation: Retired  Tobacco Use  . Smoking status: Former Smoker    Types: Cigarettes    Quit date: 08/12/2018    Years since quitting: 1.2  . Smokeless tobacco: Never Used  Substance and Sexual Activity  . Alcohol use: No    Alcohol/week: 0.0 standard drinks  . Drug use: No  . Sexual activity: Not on file  Other Topics Concern  . Not on file  Social History Narrative  . Not on file   Social Determinants of Health   Financial Resource Strain:   . Difficulty of Paying Living Expenses:   Food Insecurity:   . Worried About Charity fundraiser in the Last Year:   . Arboriculturist in the Last Year:   Transportation Needs: No Transportation Needs  . Lack of Transportation (Medical): No  . Lack of Transportation (Non-Medical): No  Physical Activity:   . Days of  Exercise per Week:   . Minutes of Exercise per Session:   Stress:   . Feeling of Stress :   Social Connections:   . Frequency of Communication with Friends and Family:   . Frequency of Social Gatherings with Friends and Family:   . Attends Religious Services:   . Active Member of Clubs or Organizations:   . Attends Archivist Meetings:   Marland Kitchen Marital Status:   Intimate Partner Violence:   . Fear of Current or Ex-Partner:   . Emotionally Abused:   Marland Kitchen Physically Abused:   . Sexually Abused:     FAMILY HISTORY: Family History  Problem Relation Age of Onset  . Diabetes Mother   . Hemophilia Mother   . Breast cancer Sister   . Heart disease Father   . Colon polyps Father   . Diabetes Brother   . Colon polyps Sister      ALLERGIES:  is allergic to guaifenesin & derivatives; tape; zanaflex [tizanidine hcl]; benazepril; morphine; zyrtec [cetirizine]; and latex.  MEDICATIONS:  Current Outpatient Medications  Medication Sig Dispense Refill  . acetaminophen (TYLENOL) 325 MG tablet Take 2 tablets (650 mg total) by mouth every 4 (four) hours as needed for mild pain (or temp > 37.5 C (99.5 F)).    Marland Kitchen albuterol (VENTOLIN HFA) 108 (90 Base) MCG/ACT inhaler Inhale 2 puffs into the lungs every 4 (four) hours as needed for wheezing or shortness of breath. 54 g 3  . apixaban (ELIQUIS) 5 MG TABS tablet Take 5 mg by mouth 2 (two) times daily.    Marland Kitchen atorvastatin (LIPITOR) 20 MG tablet Take 20 mg by mouth at bedtime.    . bimatoprost (LUMIGAN) 0.01 % SOLN Place 1 drop into both eyes at bedtime.    . brinzolamide (AZOPT) 1 % ophthalmic suspension Place 1 drop into both eyes every 12 (twelve) hours.     Marland Kitchen diltiazem (CARDIZEM) 120 MG tablet Take 120 mg by mouth daily.    Marland Kitchen escitalopram (LEXAPRO) 10 MG tablet Take 1 tablet (10 mg total) by mouth daily. 90 tablet 3  . furosemide (LASIX) 20 MG tablet Take 1 tablet (20 mg total) by mouth daily. 30 tablet   . levETIRAcetam (KEPPRA) 1000 MG tablet Take 1 tablet (1,000 mg total) by mouth 2 (two) times daily.    Marland Kitchen lidocaine (LIDODERM) 5 % Place 1 patch onto the skin daily. Remove & Discard patch within 12 hours or as directed by MD 5 patch 0  . losartan (COZAAR) 25 MG tablet Take 3 tablets (75 mg total) by mouth daily.    . Melatonin 3 MG TABS Take 1 tablet (3 mg total) by mouth at bedtime.  0  . metFORMIN (GLUCOPHAGE) 500 MG tablet Take 1 tablet (500 mg total) by mouth 2 (two) times daily with a meal.    . nitrofurantoin, macrocrystal-monohydrate, (MACROBID) 100 MG capsule Take 100 mg by mouth 2 (two) times daily.    . OXYGEN Inhale 2 L into the lungs at bedtime.     Marland Kitchen rOPINIRole (REQUIP) 2 MG tablet Take 1 tablet (2 mg total) by mouth 3 (three) times daily. 270 tablet 3  . traMADol  (ULTRAM) 50 MG tablet Take 0.5 tablets (25 mg total) by mouth every 12 (twelve) hours as needed for severe pain. 5 tablet 0  . tuberculin (TUBERSOL) 5 UNIT/0.1ML injection Inject into the skin every 14 (fourteen) days.    . Vitamin D, Ergocalciferol, (DRISDOL) 1.25 MG (50000 UNIT) CAPS capsule TAKE  1 CAPSULE BY MOUTH ONE TIME PER WEEK 12 capsule 1   No current facility-administered medications for this visit.    REVIEW OF SYSTEMS:    10 Point review of Systems was done is negative except as noted above.  PHYSICAL EXAMINATION: ECOG PERFORMANCE STATUS: {CHL ONC ECOG [id number]  .There were no vitals filed for this visit. There were no vitals filed for this visit. .There is no height or weight on file to calculate BMI.  *** GENERAL:alert, in no acute distress and comfortable SKIN: no acute rashes, no significant lesions EYES: conjunctiva are pink and non-injected, sclera anicteric OROPHARYNX: MMM, no exudates, no oropharyngeal erythema or ulceration NECK: supple, no JVD LYMPH:  no palpable lymphadenopathy in the cervical, axillary or inguinal regions LUNGS: clear to auscultation b/l with normal respiratory effort HEART: regular rate & rhythm ABDOMEN:  normoactive bowel sounds , non tender, not distended. Extremity: no pedal edema PSYCH: alert & oriented x 3 with fluent speech NEURO: no focal motor/sensory deficits  LABORATORY DATA:  I have reviewed the data as listed  . CBC Latest Ref Rng & Units 10/19/2019 10/05/2019 10/03/2019  WBC 3.4 - 10.8 x10E3/uL 11.1(H) 10.7(H) 11.1(H)  Hemoglobin 11.1 - 15.9 g/dL 15.0 13.9 13.3  Hematocrit 34.0 - 46.6 % 43.3 40.8 39.5  Platelets 150 - 450 x10E3/uL 525(H) 405(H) 435(H)    . CMP Latest Ref Rng & Units 10/19/2019 10/03/2019 09/29/2019  Glucose 65 - 99 mg/dL 155(H) 129(H) 133(H)  BUN 8 - 27 mg/dL 24 13 7(L)  Creatinine 0.57 - 1.00 mg/dL 0.82 0.72 0.60  Sodium 134 - 144 mmol/L 140 135 134(L)  Potassium 3.5 - 5.2 mmol/L 2.8(LL) 3.4(L)  3.4(L)  Chloride 96 - 106 mmol/L 96 99 100  CO2 20 - 29 mmol/L 22 25 23   Calcium 8.7 - 10.3 mg/dL 10.1 9.8 9.1  Total Protein 6.5 - 8.1 g/dL - - -  Total Bilirubin 0.3 - 1.2 mg/dL - - -  Alkaline Phos 38 - 126 U/L - - -  AST 15 - 41 U/L - - -  ALT 0 - 44 U/L - - -     RADIOGRAPHIC STUDIES: I have personally reviewed the radiological images as listed and agreed with the findings in the report. No results found.  ASSESSMENT & PLAN:   PLAN: ***  FOLLOW UP: ***  All of the patients questions were answered with apparent satisfaction. The patient knows to call the clinic with any problems, questions or concerns.  I spent *** counseling the patient face to face. The total time spent in the appointment was *** and more than 50% was on counseling and direct patient cares.    Sullivan Lone MD [place] AAHIVMS [facility] Hematology/Oncology Physician [facility]  (Office):       [phone] (Work cell):  [phone] (Fax):           [phone]  11/22/2019 8:50 AM  I, Yevette Edwards, am acting as a scribe for Dr. Sullivan Lone.   {Add Barista Statement}

## 2019-11-23 ENCOUNTER — Other Ambulatory Visit: Payer: Self-pay

## 2019-11-23 ENCOUNTER — Encounter: Payer: Medicare Other | Attending: Physical Medicine & Rehabilitation | Admitting: Physical Medicine & Rehabilitation

## 2019-11-23 ENCOUNTER — Encounter: Payer: Self-pay | Admitting: Physical Medicine & Rehabilitation

## 2019-11-23 DIAGNOSIS — G8114 Spastic hemiplegia affecting left nondominant side: Secondary | ICD-10-CM | POA: Insufficient documentation

## 2019-11-23 NOTE — Progress Notes (Signed)
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 This includes bleeding, bruising, infection, excessive weakness, or medication side effects. A REMS form is on file and signed.  Needle: 57mm injectable monopolar needle electrode  right Number of units per muscle Pectoralis Major 0 units Pectoralis Minor 0 units Biceps 150 units Brachioradialis 50 units FCR 25 units FCU 25 units FDS 75 units FDP 75 units FPL 0 units Pronator Teres 0 units Pronator Quadratus 0 units Lumbricals 0 units All injections were done after obtaining appropriate EMG activity and after negative drawback for blood. The patient tolerated the procedure well. Post procedure instructions were given. Return in about 3 months (around 02/22/2020).  z

## 2019-11-23 NOTE — Patient Instructions (Signed)
PLEASE FEEL FREE TO CALL OUR OFFICE WITH ANY PROBLEMS OR QUESTIONS ([phone])      

## 2019-11-30 DIAGNOSIS — L03011 Cellulitis of right finger: Secondary | ICD-10-CM | POA: Diagnosis not present

## 2019-11-30 DIAGNOSIS — D72829 Elevated white blood cell count, unspecified: Secondary | ICD-10-CM | POA: Diagnosis not present

## 2019-11-30 DIAGNOSIS — S61001A Unspecified open wound of right thumb without damage to nail, initial encounter: Secondary | ICD-10-CM | POA: Diagnosis not present

## 2019-11-30 DIAGNOSIS — K1379 Other lesions of oral mucosa: Secondary | ICD-10-CM | POA: Diagnosis not present

## 2019-12-01 ENCOUNTER — Other Ambulatory Visit: Payer: Self-pay | Admitting: *Deleted

## 2019-12-01 DIAGNOSIS — M79603 Pain in arm, unspecified: Secondary | ICD-10-CM

## 2019-12-02 DIAGNOSIS — N390 Urinary tract infection, site not specified: Secondary | ICD-10-CM | POA: Diagnosis not present

## 2019-12-02 DIAGNOSIS — S61001D Unspecified open wound of right thumb without damage to nail, subsequent encounter: Secondary | ICD-10-CM | POA: Diagnosis not present

## 2019-12-02 DIAGNOSIS — L03011 Cellulitis of right finger: Secondary | ICD-10-CM | POA: Diagnosis not present

## 2019-12-05 ENCOUNTER — Other Ambulatory Visit: Payer: Self-pay

## 2019-12-05 ENCOUNTER — Ambulatory Visit (INDEPENDENT_AMBULATORY_CARE_PROVIDER_SITE_OTHER): Payer: No Typology Code available for payment source

## 2019-12-05 DIAGNOSIS — R410 Disorientation, unspecified: Secondary | ICD-10-CM | POA: Diagnosis not present

## 2019-12-05 DIAGNOSIS — G8110 Spastic hemiplegia affecting unspecified side: Secondary | ICD-10-CM

## 2019-12-07 DIAGNOSIS — S61001D Unspecified open wound of right thumb without damage to nail, subsequent encounter: Secondary | ICD-10-CM | POA: Diagnosis not present

## 2019-12-07 DIAGNOSIS — D72829 Elevated white blood cell count, unspecified: Secondary | ICD-10-CM | POA: Diagnosis not present

## 2019-12-07 DIAGNOSIS — R7989 Other specified abnormal findings of blood chemistry: Secondary | ICD-10-CM | POA: Diagnosis not present

## 2019-12-07 DIAGNOSIS — L03011 Cellulitis of right finger: Secondary | ICD-10-CM | POA: Diagnosis not present

## 2019-12-08 ENCOUNTER — Encounter: Payer: Self-pay | Admitting: Vascular Surgery

## 2019-12-08 ENCOUNTER — Ambulatory Visit (HOSPITAL_COMMUNITY)
Admission: RE | Admit: 2019-12-08 | Discharge: 2019-12-08 | Disposition: A | Discharge status: Home / Self Care | Payer: No Typology Code available for payment source | Source: Ambulatory Visit | Attending: Vascular Surgery | Admitting: Vascular Surgery

## 2019-12-08 ENCOUNTER — Other Ambulatory Visit: Payer: Self-pay

## 2019-12-08 ENCOUNTER — Ambulatory Visit (INDEPENDENT_AMBULATORY_CARE_PROVIDER_SITE_OTHER): Payer: Medicare Other | Admitting: Vascular Surgery

## 2019-12-08 VITALS — BP 104/54 | HR 93 | Temp 97.0°F | Resp 22 | Ht 64.0 in

## 2019-12-08 DIAGNOSIS — I639 Cerebral infarction, unspecified: Secondary | ICD-10-CM | POA: Diagnosis not present

## 2019-12-08 DIAGNOSIS — M79603 Pain in arm, unspecified: Secondary | ICD-10-CM | POA: Insufficient documentation

## 2019-12-08 DIAGNOSIS — I6389 Other cerebral infarction: Secondary | ICD-10-CM | POA: Diagnosis not present

## 2019-12-08 DIAGNOSIS — I959 Hypotension, unspecified: Secondary | ICD-10-CM | POA: Diagnosis not present

## 2019-12-08 DIAGNOSIS — I739 Peripheral vascular disease, unspecified: Secondary | ICD-10-CM

## 2019-12-08 DIAGNOSIS — L03011 Cellulitis of right finger: Secondary | ICD-10-CM | POA: Diagnosis not present

## 2019-12-08 DIAGNOSIS — E785 Hyperlipidemia, unspecified: Secondary | ICD-10-CM | POA: Diagnosis not present

## 2019-12-08 NOTE — Progress Notes (Signed)
Patient name: [name] [name] MRN: [id number] DOB: [birth year] Sex: female  REASON FOR CONSULT: Right hand ulcers  HPI: [name] [name] is a 69 y.o. female, with prior significant stroke resulting in left upper extremity left leg weakness right gaze preference.  He currently resides in a skilled nursing facility.  There were minimal records available for review today regarding her current problem and reason for her referral.  Apparently this is related to ulcerations on her right hand.  The patient is not really able to provide significant history due to cognitive deficits.  She is able to answer some yes/no questions.  She states that she has itching in the right arm and some of the ulcers on her right side are secondary to trauma from scratching.  He does not really describe pain in her right arm.  She has aches and pains all over.  This is very nonspecific.  Other medical problems include coronary artery disease, COPD, diabetes, elevated cholesterol, hypertension.  All of these are currently stable.  She also has had a prior pulmonary embolus and has an IVC filter.  Past Medical History:  Diagnosis Date  . Aneurysm, thoracic aortic ([place])   . Anxiety   . Arthritis   . CAD (coronary artery disease)   . Chronic kidney disease    Renal insufficiency  . COPD (chronic obstructive pulmonary disease) ([place])   . Depression   . Diabetes mellitus   . Diverticulosis of colon (without mention of hemorrhage)   . Duodenitis without mention of hemorrhage   . Embolic stroke ([place])   . GERD (gastroesophageal reflux disease)   . Hemorrhoids   . Hypercholesteremia   . Hypertension   . Insomnia   . Pulmonary embolism ([place])   . Tinnitus   . Vertigo    Past Surgical History:  Procedure Laterality Date  . ANGIOPLASTY    . BUBBLE STUDY  09/14/2019   Procedure: BUBBLE STUDY;  Surgeon: Donato Heinz, MD;  Location: [facility];  Service: Endoscopy;;  . CHOLECYSTECTOMY    . IR IVC FILTER PLMT /  S&I /IMG GUID/MOD SED  09/24/2019  . ROTATOR CUFF REPAIR Right 04/2012  . TEE WITHOUT CARDIOVERSION N/A 09/14/2019   Procedure: TRANSESOPHAGEAL ECHOCARDIOGRAM (TEE);  Surgeon: Donato Heinz, MD;  Location: [facility] ENDOSCOPY;  Service: Endoscopy;  Laterality: N/A;  . TUBAL LIGATION      Family History  Problem Relation Age of Onset  . Diabetes Mother   . Hemophilia Mother   . Breast cancer Sister   . Heart disease Father   . Colon polyps Father   . Diabetes Brother   . Colon polyps Sister     SOCIAL HISTORY: Social History   Socioeconomic History  . Marital status: Widowed    Spouse name: Not on file  . Number of children: 1  . Years of education: Not on file  . Highest education level: Not on file  Occupational History  . Occupation: Retired  Tobacco Use  . Smoking status: Former Smoker    Types: Cigarettes    Quit date: 08/12/2018    Years since quitting: 1.3  . Smokeless tobacco: Never Used  Substance and Sexual Activity  . Alcohol use: No    Alcohol/week: 0.0 standard drinks  . Drug use: No  . Sexual activity: Not on file  Other Topics Concern  . Not on file  Social History Narrative  . Not on file   Social Determinants of Health   Financial Resource  Strain:   . Difficulty of Paying Living Expenses:   Food Insecurity:   . Worried About Charity fundraiser in the Last Year:   . Arboriculturist in the Last Year:   Transportation Needs: No Transportation Needs  . Lack of Transportation (Medical): No  . Lack of Transportation (Non-Medical): No  Physical Activity:   . Days of Exercise per Week:   . Minutes of Exercise per Session:   Stress:   . Feeling of Stress :   Social Connections:   . Frequency of Communication with Friends and Family:   . Frequency of Social Gatherings with Friends and Family:   . Attends Religious Services:   . Active Member of Clubs or Organizations:   . Attends Archivist Meetings:   Marland Kitchen Marital Status:   Intimate  Partner Violence:   . Fear of Current or Ex-Partner:   . Emotionally Abused:   Marland Kitchen Physically Abused:   . Sexually Abused:     Allergies  Allergen Reactions  . Guaifenesin & Derivatives Anaphylaxis    Severe Reaction  . Tape Other (See Comments)    TAPE PEELS OFF THE SKIN!!!!!!!  . Zanaflex [Tizanidine Hcl] Other (See Comments)    Seizure activity  . Benazepril Cough  . Morphine Nausea Only  . Zyrtec [Cetirizine] Swelling and Other (See Comments)    Hands and feet became swollen  . Latex Rash and Other (See Comments)    No powdered gloves!!    Current Outpatient Medications  Medication Sig Dispense Refill  . acetaminophen (TYLENOL) 325 MG tablet Take 2 tablets (650 mg total) by mouth every 4 (four) hours as needed for mild pain (or temp > 37.5 C (99.5 F)).    Marland Kitchen albuterol (VENTOLIN HFA) 108 (90 Base) MCG/ACT inhaler Inhale 2 puffs into the lungs every 4 (four) hours as needed for wheezing or shortness of breath. 54 g 3  . apixaban (ELIQUIS) 5 MG TABS tablet Take 5 mg by mouth 2 (two) times daily.    Marland Kitchen atorvastatin (LIPITOR) 20 MG tablet Take 20 mg by mouth at bedtime.    . bimatoprost (LUMIGAN) 0.01 % SOLN Place 1 drop into both eyes at bedtime.    . brinzolamide (AZOPT) 1 % ophthalmic suspension Place 1 drop into both eyes every 12 (twelve) hours.     Marland Kitchen diltiazem (CARDIZEM) 120 MG tablet Take 120 mg by mouth daily.    Marland Kitchen escitalopram (LEXAPRO) 10 MG tablet Take 1 tablet (10 mg total) by mouth daily. 90 tablet 3  . furosemide (LASIX) 20 MG tablet Take 1 tablet (20 mg total) by mouth daily. 30 tablet   . levETIRAcetam (KEPPRA) 1000 MG tablet Take 1 tablet (1,000 mg total) by mouth 2 (two) times daily.    Marland Kitchen lidocaine (LIDODERM) 5 % Place 1 patch onto the skin daily. Remove & Discard patch within 12 hours or as directed by MD 5 patch 0  . losartan (COZAAR) 25 MG tablet Take 3 tablets (75 mg total) by mouth daily.    . Melatonin 3 MG TABS Take 1 tablet (3 mg total) by mouth at bedtime.   0  . metFORMIN (GLUCOPHAGE) 500 MG tablet Take 1 tablet (500 mg total) by mouth 2 (two) times daily with a meal.    . nitrofurantoin, macrocrystal-monohydrate, (MACROBID) 100 MG capsule Take 100 mg by mouth 2 (two) times daily.    . OXYGEN Inhale 2 L into the lungs at bedtime.     Marland Kitchen  rOPINIRole (REQUIP) 2 MG tablet Take 1 tablet (2 mg total) by mouth 3 (three) times daily. 270 tablet 3  . traMADol (ULTRAM) 50 MG tablet Take 0.5 tablets (25 mg total) by mouth every 12 (twelve) hours as needed for severe pain. 5 tablet 0  . Vitamin D, Ergocalciferol, (DRISDOL) 1.25 MG (50000 UNIT) CAPS capsule TAKE 1 CAPSULE BY MOUTH ONE TIME PER WEEK 12 capsule 1  . tuberculin (TUBERSOL) 5 UNIT/0.1ML injection Inject into the skin every 14 (fourteen) days.     No current facility-administered medications for this visit.    ROS:   Unable to obtain secondary to cognitive deficits.  Physical Examination  Vitals:   12/08/19 0950  BP: (!) 104/54  Pulse: 93  Resp: (!) 22  Temp: (!) 97 F (36.1 C)  TempSrc: Oral  SpO2: 95%  Height: 5\' 4"  (1.626 m)    Body mass index is 30.43 kg/m.  General:  Alert and oriented to person but not situation, no acute distress, patient is in wheelchair.  She does not always answer questions appropriately.   HEENT: Right gaze preference Neck: No JVD Cardiac: Regular Rate and Rhythm Skin: No rash, shallow ulcers tips of fingers right hand especially the right thumb which has some epidermal loss.  All of these wounds are in a variety of healing process.  All of these appear to be healing overall. Extremity Pulses:  2+ radial, brachial left side absent right brachial radial pulse  Musculoskeletal: No deformity or edema  Neurologic: Spastic paralysis left upper extremity, cognitive deficits very slow to answer questions not sure she understands all questions perfectly  DATA:  Patient had a bilateral upper extremity duplex today.  This showed occlusion of the right subclavian  artery.  She had monophasic Doppler flow throughout the entire right upper extremity.  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  She is very poor candidate for operation based on her overall debility from her recent stroke.  She currently has very superficial ulcerations on the right hand which should heal if she can avoid further trauma.  It appears due to her mental status she has a habit of itching and scratching and abrading the fingers on the right hand.   PLAN: Would only consider an intervention for her right subclavian occlusion if she had a limb threatening ischemia to the right arm.  This is very rare with just a subclavian artery occlusion.  If she is able to protect the hand and avoid further trauma these wounds should heal.  She will see [place] back in follow-up in 4 to 6 weeks to recheck the wounds on her right hand.  If she has significant deterioration of these wounds consideration for arteriogram and possible right carotid subclavian artery bypass would be considered.  However, the patient's overall debilitated state make her a very poor candidate for any intervention.   Ruta Hinds, MD [facility] Office: [phone] Pager: [phone]

## 2019-12-12 DIAGNOSIS — D72829 Elevated white blood cell count, unspecified: Secondary | ICD-10-CM | POA: Diagnosis not present

## 2019-12-12 DIAGNOSIS — I10 Essential (primary) hypertension: Secondary | ICD-10-CM | POA: Diagnosis not present

## 2019-12-12 DIAGNOSIS — N390 Urinary tract infection, site not specified: Secondary | ICD-10-CM | POA: Diagnosis not present

## 2019-12-12 DIAGNOSIS — I959 Hypotension, unspecified: Secondary | ICD-10-CM | POA: Diagnosis not present

## 2019-12-14 ENCOUNTER — Telehealth: Payer: Self-pay | Admitting: Neurology

## 2019-12-14 NOTE — Telephone Encounter (Signed)
[name] [name] on [place] called wanting to know if the pt's EEG results have come in. Please advise.

## 2019-12-14 NOTE — Telephone Encounter (Signed)
I called [name] [name] on dpr that EEG results can take up to 24 to 72 hours. [name] [name] appreciate call and will wait for results.

## 2019-12-15 NOTE — Telephone Encounter (Signed)
I called pts son [name] [name] that the  EEG study done on 12/12/2019 is slightly abnormal and shows mild slowing of brain activity which is likely result of her recent strokes.  No seizure activity.  Nothing to worry about. He verbalized understanding.

## 2019-12-15 NOTE — Telephone Encounter (Signed)
Kindly inform the patient that EEG study done on 12/12/2019 is slightly abnormal and shows mild slowing of brain activity which is likely result of her recent strokes.  No seizure activity.  Nothing to worry about

## 2019-12-16 DIAGNOSIS — I959 Hypotension, unspecified: Secondary | ICD-10-CM | POA: Diagnosis not present

## 2019-12-16 DIAGNOSIS — E119 Type 2 diabetes mellitus without complications: Secondary | ICD-10-CM | POA: Diagnosis not present

## 2019-12-16 DIAGNOSIS — U071 COVID-19: Secondary | ICD-10-CM | POA: Diagnosis not present

## 2019-12-16 DIAGNOSIS — I10 Essential (primary) hypertension: Secondary | ICD-10-CM | POA: Diagnosis not present

## 2019-12-16 NOTE — Progress Notes (Signed)
Kindly inform the patient that EEG study shows slight slowing of brainwave activity which can occur in patients with memory loss.  No seizure activity and nothing to worry about

## 2019-12-20 DIAGNOSIS — U071 COVID-19: Secondary | ICD-10-CM | POA: Diagnosis not present

## 2019-12-20 DIAGNOSIS — G8111 Spastic hemiplegia affecting right dominant side: Secondary | ICD-10-CM | POA: Diagnosis not present

## 2019-12-20 DIAGNOSIS — M6281 Muscle weakness (generalized): Secondary | ICD-10-CM | POA: Diagnosis not present

## 2019-12-20 DIAGNOSIS — R278 Other lack of coordination: Secondary | ICD-10-CM | POA: Diagnosis not present

## 2019-12-20 DIAGNOSIS — R05 Cough: Secondary | ICD-10-CM | POA: Diagnosis not present

## 2019-12-20 DIAGNOSIS — M6259 Muscle wasting and atrophy, not elsewhere classified, multiple sites: Secondary | ICD-10-CM | POA: Diagnosis not present

## 2019-12-20 DIAGNOSIS — R7989 Other specified abnormal findings of blood chemistry: Secondary | ICD-10-CM | POA: Diagnosis not present

## 2019-12-21 DIAGNOSIS — G8111 Spastic hemiplegia affecting right dominant side: Secondary | ICD-10-CM | POA: Diagnosis not present

## 2019-12-21 DIAGNOSIS — R278 Other lack of coordination: Secondary | ICD-10-CM | POA: Diagnosis not present

## 2019-12-21 DIAGNOSIS — M6281 Muscle weakness (generalized): Secondary | ICD-10-CM | POA: Diagnosis not present

## 2019-12-21 DIAGNOSIS — U071 COVID-19: Secondary | ICD-10-CM | POA: Diagnosis not present

## 2019-12-21 DIAGNOSIS — E871 Hypo-osmolality and hyponatremia: Secondary | ICD-10-CM | POA: Diagnosis not present

## 2019-12-21 DIAGNOSIS — I10 Essential (primary) hypertension: Secondary | ICD-10-CM | POA: Diagnosis not present

## 2019-12-21 DIAGNOSIS — M6259 Muscle wasting and atrophy, not elsewhere classified, multiple sites: Secondary | ICD-10-CM | POA: Diagnosis not present

## 2019-12-22 DIAGNOSIS — M6281 Muscle weakness (generalized): Secondary | ICD-10-CM | POA: Diagnosis not present

## 2019-12-22 DIAGNOSIS — G8111 Spastic hemiplegia affecting right dominant side: Secondary | ICD-10-CM | POA: Diagnosis not present

## 2019-12-22 DIAGNOSIS — M6259 Muscle wasting and atrophy, not elsewhere classified, multiple sites: Secondary | ICD-10-CM | POA: Diagnosis not present

## 2019-12-22 DIAGNOSIS — R278 Other lack of coordination: Secondary | ICD-10-CM | POA: Diagnosis not present

## 2019-12-26 ENCOUNTER — Other Ambulatory Visit: Payer: Self-pay | Admitting: *Deleted

## 2019-12-26 NOTE — Patient Outreach (Signed)
[facility] Post-Acute Care Coordinator follow u  [name] [name] has transitioned to long term care at [facility].   Will inquire again about palliative care referral with facility [place], MSN-Ed, RN,BSN [place] Acute Care Coordinator [phone] [facility]) [phone]  (Toll free office)

## 2019-12-28 DIAGNOSIS — G8111 Spastic hemiplegia affecting right dominant side: Secondary | ICD-10-CM | POA: Diagnosis not present

## 2019-12-28 DIAGNOSIS — R278 Other lack of coordination: Secondary | ICD-10-CM | POA: Diagnosis not present

## 2019-12-28 DIAGNOSIS — M6281 Muscle weakness (generalized): Secondary | ICD-10-CM | POA: Diagnosis not present

## 2019-12-28 DIAGNOSIS — M6259 Muscle wasting and atrophy, not elsewhere classified, multiple sites: Secondary | ICD-10-CM | POA: Diagnosis not present

## 2019-12-29 DIAGNOSIS — G8111 Spastic hemiplegia affecting right dominant side: Secondary | ICD-10-CM | POA: Diagnosis not present

## 2019-12-29 DIAGNOSIS — M6259 Muscle wasting and atrophy, not elsewhere classified, multiple sites: Secondary | ICD-10-CM | POA: Diagnosis not present

## 2019-12-29 DIAGNOSIS — M6281 Muscle weakness (generalized): Secondary | ICD-10-CM | POA: Diagnosis not present

## 2019-12-29 DIAGNOSIS — R278 Other lack of coordination: Secondary | ICD-10-CM | POA: Diagnosis not present

## 2020-01-02 DIAGNOSIS — R278 Other lack of coordination: Secondary | ICD-10-CM | POA: Diagnosis not present

## 2020-01-02 DIAGNOSIS — G8111 Spastic hemiplegia affecting right dominant side: Secondary | ICD-10-CM | POA: Diagnosis not present

## 2020-01-02 DIAGNOSIS — M6281 Muscle weakness (generalized): Secondary | ICD-10-CM | POA: Diagnosis not present

## 2020-01-02 DIAGNOSIS — M6259 Muscle wasting and atrophy, not elsewhere classified, multiple sites: Secondary | ICD-10-CM | POA: Diagnosis not present

## 2020-01-03 DIAGNOSIS — I2694 Multiple subsegmental pulmonary emboli without acute cor pulmonale: Secondary | ICD-10-CM | POA: Diagnosis not present

## 2020-01-03 DIAGNOSIS — I10 Essential (primary) hypertension: Secondary | ICD-10-CM | POA: Diagnosis not present

## 2020-01-03 DIAGNOSIS — I69354 Hemiplegia and hemiparesis following cerebral infarction affecting left non-dominant side: Secondary | ICD-10-CM | POA: Diagnosis not present

## 2020-01-03 DIAGNOSIS — G8111 Spastic hemiplegia affecting right dominant side: Secondary | ICD-10-CM | POA: Diagnosis not present

## 2020-01-03 DIAGNOSIS — R278 Other lack of coordination: Secondary | ICD-10-CM | POA: Diagnosis not present

## 2020-01-03 DIAGNOSIS — S4292XD Fracture of left shoulder girdle, part unspecified, subsequent encounter for fracture with routine healing: Secondary | ICD-10-CM | POA: Diagnosis not present

## 2020-01-03 DIAGNOSIS — M6259 Muscle wasting and atrophy, not elsewhere classified, multiple sites: Secondary | ICD-10-CM | POA: Diagnosis not present

## 2020-01-03 DIAGNOSIS — M6281 Muscle weakness (generalized): Secondary | ICD-10-CM | POA: Diagnosis not present

## 2020-01-04 DIAGNOSIS — G8111 Spastic hemiplegia affecting right dominant side: Secondary | ICD-10-CM | POA: Diagnosis not present

## 2020-01-04 DIAGNOSIS — M6259 Muscle wasting and atrophy, not elsewhere classified, multiple sites: Secondary | ICD-10-CM | POA: Diagnosis not present

## 2020-01-04 DIAGNOSIS — M6281 Muscle weakness (generalized): Secondary | ICD-10-CM | POA: Diagnosis not present

## 2020-01-04 DIAGNOSIS — R278 Other lack of coordination: Secondary | ICD-10-CM | POA: Diagnosis not present

## 2020-01-05 DIAGNOSIS — M6259 Muscle wasting and atrophy, not elsewhere classified, multiple sites: Secondary | ICD-10-CM | POA: Diagnosis not present

## 2020-01-05 DIAGNOSIS — M6281 Muscle weakness (generalized): Secondary | ICD-10-CM | POA: Diagnosis not present

## 2020-01-05 DIAGNOSIS — G8111 Spastic hemiplegia affecting right dominant side: Secondary | ICD-10-CM | POA: Diagnosis not present

## 2020-01-05 DIAGNOSIS — R278 Other lack of coordination: Secondary | ICD-10-CM | POA: Diagnosis not present

## 2020-01-06 DIAGNOSIS — M6281 Muscle weakness (generalized): Secondary | ICD-10-CM | POA: Diagnosis not present

## 2020-01-06 DIAGNOSIS — M6259 Muscle wasting and atrophy, not elsewhere classified, multiple sites: Secondary | ICD-10-CM | POA: Diagnosis not present

## 2020-01-06 DIAGNOSIS — G8111 Spastic hemiplegia affecting right dominant side: Secondary | ICD-10-CM | POA: Diagnosis not present

## 2020-01-06 DIAGNOSIS — R278 Other lack of coordination: Secondary | ICD-10-CM | POA: Diagnosis not present

## 2020-01-09 DIAGNOSIS — I10 Essential (primary) hypertension: Secondary | ICD-10-CM | POA: Diagnosis not present

## 2020-01-09 DIAGNOSIS — S4292XD Fracture of left shoulder girdle, part unspecified, subsequent encounter for fracture with routine healing: Secondary | ICD-10-CM | POA: Diagnosis not present

## 2020-01-11 DIAGNOSIS — R634 Abnormal weight loss: Secondary | ICD-10-CM | POA: Diagnosis not present

## 2020-01-11 DIAGNOSIS — R0989 Other specified symptoms and signs involving the circulatory and respiratory systems: Secondary | ICD-10-CM | POA: Diagnosis not present

## 2020-01-11 DIAGNOSIS — R410 Disorientation, unspecified: Secondary | ICD-10-CM | POA: Diagnosis not present

## 2020-01-12 ENCOUNTER — Ambulatory Visit: Payer: Medicare Other | Admitting: Orthopaedic Surgery

## 2020-01-12 ENCOUNTER — Ambulatory Visit: Payer: Medicare Other | Admitting: Vascular Surgery

## 2020-01-13 DIAGNOSIS — R410 Disorientation, unspecified: Secondary | ICD-10-CM | POA: Diagnosis not present

## 2020-01-13 DIAGNOSIS — R634 Abnormal weight loss: Secondary | ICD-10-CM | POA: Diagnosis not present

## 2020-01-13 DIAGNOSIS — S42252D Displaced fracture of greater tuberosity of left humerus, subsequent encounter for fracture with routine healing: Secondary | ICD-10-CM | POA: Diagnosis not present

## 2020-01-13 DIAGNOSIS — J449 Chronic obstructive pulmonary disease, unspecified: Secondary | ICD-10-CM | POA: Diagnosis not present

## 2020-01-16 DIAGNOSIS — S4292XD Fracture of left shoulder girdle, part unspecified, subsequent encounter for fracture with routine healing: Secondary | ICD-10-CM | POA: Diagnosis not present

## 2020-01-16 DIAGNOSIS — Z711 Person with feared health complaint in whom no diagnosis is made: Secondary | ICD-10-CM | POA: Diagnosis not present

## 2020-01-16 DIAGNOSIS — I69354 Hemiplegia and hemiparesis following cerebral infarction affecting left non-dominant side: Secondary | ICD-10-CM | POA: Diagnosis not present

## 2020-01-19 DIAGNOSIS — U071 COVID-19: Secondary | ICD-10-CM | POA: Diagnosis not present

## 2020-01-25 ENCOUNTER — Encounter: Payer: Self-pay | Admitting: Surgery

## 2020-01-27 ENCOUNTER — Encounter: Payer: Medicare Other | Admitting: Family Medicine

## 2020-02-09 ENCOUNTER — Encounter: Payer: Self-pay | Admitting: Surgery

## 2020-02-09 DIAGNOSIS — R278 Other lack of coordination: Secondary | ICD-10-CM | POA: Diagnosis not present

## 2020-02-09 DIAGNOSIS — I69054 Hemiplegia and hemiparesis following nontraumatic subarachnoid hemorrhage affecting left non-dominant side: Secondary | ICD-10-CM | POA: Diagnosis not present

## 2020-02-09 DIAGNOSIS — M24571 Contracture, right ankle: Secondary | ICD-10-CM | POA: Diagnosis not present

## 2020-02-09 DIAGNOSIS — M6259 Muscle wasting and atrophy, not elsewhere classified, multiple sites: Secondary | ICD-10-CM | POA: Diagnosis not present

## 2020-02-09 DIAGNOSIS — I69391 Dysphagia following cerebral infarction: Secondary | ICD-10-CM | POA: Diagnosis not present

## 2020-02-09 DIAGNOSIS — I69352 Hemiplegia and hemiparesis following cerebral infarction affecting left dominant side: Secondary | ICD-10-CM | POA: Diagnosis not present

## 2020-02-09 DIAGNOSIS — M24572 Contracture, left ankle: Secondary | ICD-10-CM | POA: Diagnosis not present

## 2020-02-09 DIAGNOSIS — G8111 Spastic hemiplegia affecting right dominant side: Secondary | ICD-10-CM | POA: Diagnosis not present

## 2020-02-09 DIAGNOSIS — M6281 Muscle weakness (generalized): Secondary | ICD-10-CM | POA: Diagnosis not present

## 2020-02-09 DIAGNOSIS — R4701 Aphasia: Secondary | ICD-10-CM | POA: Diagnosis not present

## 2020-02-10 DIAGNOSIS — G8111 Spastic hemiplegia affecting right dominant side: Secondary | ICD-10-CM | POA: Diagnosis not present

## 2020-02-10 DIAGNOSIS — I69352 Hemiplegia and hemiparesis following cerebral infarction affecting left dominant side: Secondary | ICD-10-CM | POA: Diagnosis not present

## 2020-02-10 DIAGNOSIS — M6281 Muscle weakness (generalized): Secondary | ICD-10-CM | POA: Diagnosis not present

## 2020-02-10 DIAGNOSIS — M24571 Contracture, right ankle: Secondary | ICD-10-CM | POA: Diagnosis not present

## 2020-02-10 DIAGNOSIS — M6259 Muscle wasting and atrophy, not elsewhere classified, multiple sites: Secondary | ICD-10-CM | POA: Diagnosis not present

## 2020-02-10 DIAGNOSIS — I69054 Hemiplegia and hemiparesis following nontraumatic subarachnoid hemorrhage affecting left non-dominant side: Secondary | ICD-10-CM | POA: Diagnosis not present

## 2020-02-12 DIAGNOSIS — M6259 Muscle wasting and atrophy, not elsewhere classified, multiple sites: Secondary | ICD-10-CM | POA: Diagnosis not present

## 2020-02-12 DIAGNOSIS — I69352 Hemiplegia and hemiparesis following cerebral infarction affecting left dominant side: Secondary | ICD-10-CM | POA: Diagnosis not present

## 2020-02-12 DIAGNOSIS — I69054 Hemiplegia and hemiparesis following nontraumatic subarachnoid hemorrhage affecting left non-dominant side: Secondary | ICD-10-CM | POA: Diagnosis not present

## 2020-02-12 DIAGNOSIS — M24571 Contracture, right ankle: Secondary | ICD-10-CM | POA: Diagnosis not present

## 2020-02-12 DIAGNOSIS — M6281 Muscle weakness (generalized): Secondary | ICD-10-CM | POA: Diagnosis not present

## 2020-02-12 DIAGNOSIS — G8111 Spastic hemiplegia affecting right dominant side: Secondary | ICD-10-CM | POA: Diagnosis not present

## 2020-02-13 DIAGNOSIS — M6259 Muscle wasting and atrophy, not elsewhere classified, multiple sites: Secondary | ICD-10-CM | POA: Diagnosis not present

## 2020-02-13 DIAGNOSIS — M6281 Muscle weakness (generalized): Secondary | ICD-10-CM | POA: Diagnosis not present

## 2020-02-13 DIAGNOSIS — G8111 Spastic hemiplegia affecting right dominant side: Secondary | ICD-10-CM | POA: Diagnosis not present

## 2020-02-13 DIAGNOSIS — M24571 Contracture, right ankle: Secondary | ICD-10-CM | POA: Diagnosis not present

## 2020-02-13 DIAGNOSIS — I69054 Hemiplegia and hemiparesis following nontraumatic subarachnoid hemorrhage affecting left non-dominant side: Secondary | ICD-10-CM | POA: Diagnosis not present

## 2020-02-13 DIAGNOSIS — I69352 Hemiplegia and hemiparesis following cerebral infarction affecting left dominant side: Secondary | ICD-10-CM | POA: Diagnosis not present

## 2020-02-14 DIAGNOSIS — I69352 Hemiplegia and hemiparesis following cerebral infarction affecting left dominant side: Secondary | ICD-10-CM | POA: Diagnosis not present

## 2020-02-14 DIAGNOSIS — M6259 Muscle wasting and atrophy, not elsewhere classified, multiple sites: Secondary | ICD-10-CM | POA: Diagnosis not present

## 2020-02-14 DIAGNOSIS — G8111 Spastic hemiplegia affecting right dominant side: Secondary | ICD-10-CM | POA: Diagnosis not present

## 2020-02-14 DIAGNOSIS — I69054 Hemiplegia and hemiparesis following nontraumatic subarachnoid hemorrhage affecting left non-dominant side: Secondary | ICD-10-CM | POA: Diagnosis not present

## 2020-02-14 DIAGNOSIS — M24571 Contracture, right ankle: Secondary | ICD-10-CM | POA: Diagnosis not present

## 2020-02-14 DIAGNOSIS — M6281 Muscle weakness (generalized): Secondary | ICD-10-CM | POA: Diagnosis not present

## 2020-02-15 DIAGNOSIS — M6259 Muscle wasting and atrophy, not elsewhere classified, multiple sites: Secondary | ICD-10-CM | POA: Diagnosis not present

## 2020-02-15 DIAGNOSIS — I69352 Hemiplegia and hemiparesis following cerebral infarction affecting left dominant side: Secondary | ICD-10-CM | POA: Diagnosis not present

## 2020-02-15 DIAGNOSIS — G8111 Spastic hemiplegia affecting right dominant side: Secondary | ICD-10-CM | POA: Diagnosis not present

## 2020-02-15 DIAGNOSIS — I69054 Hemiplegia and hemiparesis following nontraumatic subarachnoid hemorrhage affecting left non-dominant side: Secondary | ICD-10-CM | POA: Diagnosis not present

## 2020-02-15 DIAGNOSIS — M24571 Contracture, right ankle: Secondary | ICD-10-CM | POA: Diagnosis not present

## 2020-02-15 DIAGNOSIS — M6281 Muscle weakness (generalized): Secondary | ICD-10-CM | POA: Diagnosis not present

## 2020-02-16 DIAGNOSIS — I69352 Hemiplegia and hemiparesis following cerebral infarction affecting left dominant side: Secondary | ICD-10-CM | POA: Diagnosis not present

## 2020-02-16 DIAGNOSIS — I69054 Hemiplegia and hemiparesis following nontraumatic subarachnoid hemorrhage affecting left non-dominant side: Secondary | ICD-10-CM | POA: Diagnosis not present

## 2020-02-16 DIAGNOSIS — G8111 Spastic hemiplegia affecting right dominant side: Secondary | ICD-10-CM | POA: Diagnosis not present

## 2020-02-16 DIAGNOSIS — M24571 Contracture, right ankle: Secondary | ICD-10-CM | POA: Diagnosis not present

## 2020-02-16 DIAGNOSIS — M6259 Muscle wasting and atrophy, not elsewhere classified, multiple sites: Secondary | ICD-10-CM | POA: Diagnosis not present

## 2020-02-16 DIAGNOSIS — M6281 Muscle weakness (generalized): Secondary | ICD-10-CM | POA: Diagnosis not present

## 2020-02-22 ENCOUNTER — Encounter: Payer: Medicare Other | Admitting: Physical Medicine & Rehabilitation

## 2020-02-27 DIAGNOSIS — S43002D Unspecified subluxation of left shoulder joint, subsequent encounter: Secondary | ICD-10-CM | POA: Diagnosis not present

## 2020-02-27 DIAGNOSIS — G4700 Insomnia, unspecified: Secondary | ICD-10-CM | POA: Diagnosis not present

## 2020-02-27 DIAGNOSIS — F419 Anxiety disorder, unspecified: Secondary | ICD-10-CM | POA: Diagnosis not present

## 2020-02-27 DIAGNOSIS — J449 Chronic obstructive pulmonary disease, unspecified: Secondary | ICD-10-CM | POA: Diagnosis not present

## 2020-02-27 DIAGNOSIS — I10 Essential (primary) hypertension: Secondary | ICD-10-CM | POA: Diagnosis not present

## 2020-02-27 DIAGNOSIS — E119 Type 2 diabetes mellitus without complications: Secondary | ICD-10-CM | POA: Diagnosis not present

## 2020-02-27 DIAGNOSIS — I69354 Hemiplegia and hemiparesis following cerebral infarction affecting left non-dominant side: Secondary | ICD-10-CM | POA: Diagnosis not present

## 2020-02-27 DIAGNOSIS — I82409 Acute embolism and thrombosis of unspecified deep veins of unspecified lower extremity: Secondary | ICD-10-CM | POA: Diagnosis not present

## 2020-02-27 DIAGNOSIS — I2694 Multiple subsegmental pulmonary emboli without acute cor pulmonale: Secondary | ICD-10-CM | POA: Diagnosis not present

## 2020-02-27 DIAGNOSIS — F329 Major depressive disorder, single episode, unspecified: Secondary | ICD-10-CM | POA: Diagnosis not present

## 2020-02-27 DIAGNOSIS — I2510 Atherosclerotic heart disease of native coronary artery without angina pectoris: Secondary | ICD-10-CM | POA: Diagnosis not present

## 2020-02-29 ENCOUNTER — Other Ambulatory Visit: Payer: Self-pay

## 2020-02-29 ENCOUNTER — Encounter: Payer: Medicare Other | Attending: Physical Medicine & Rehabilitation | Admitting: Physical Medicine & Rehabilitation

## 2020-02-29 ENCOUNTER — Encounter: Payer: Self-pay | Admitting: Physical Medicine & Rehabilitation

## 2020-02-29 VITALS — BP 120/76 | HR 92 | Temp 97.6°F

## 2020-02-29 DIAGNOSIS — I609 Nontraumatic subarachnoid hemorrhage, unspecified: Secondary | ICD-10-CM | POA: Insufficient documentation

## 2020-02-29 DIAGNOSIS — G8114 Spastic hemiplegia affecting left nondominant side: Secondary | ICD-10-CM | POA: Insufficient documentation

## 2020-02-29 DIAGNOSIS — I639 Cerebral infarction, unspecified: Secondary | ICD-10-CM

## 2020-02-29 NOTE — Patient Instructions (Signed)
PLEASE FEEL FREE TO CALL OUR OFFICE WITH ANY PROBLEMS OR QUESTIONS ([phone])      

## 2020-02-29 NOTE — Progress Notes (Signed)
Subjective:    Patient ID: [name] [name], female    DOB: [birth year], 69 y.o.   MRN: [id number]  HPI   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  I saw her in April and we did Botox to her left elbow and wrist flexors.  She noticed improvement in her tone after the injections but unfortunately at the facility where she resides they did not perform any therapy and the application most of her splints have been sporadic at best.  Family is trying to find other options regarding facilities at this point.  Currently her tone is returned to near where it was prior to the Botox injections.  She is making improvements in her swallowing and overall arousal.  Her head control is improved.  She seems to be eating well but still is often being fed and they are not encouraging her to use her strong arm to help with feeding.  She reports that her mood is upbeat and she remains on Lexapro as previously prescribed.  Pain Inventory Average Pain 8 Pain Right Now 8 My pain is dull  In the last 24 hours, has pain interfered with the following? General activity 8 Relation with others na Enjoyment of life 8 What TIME of day is your pain at its worst? night Sleep (in general) Poor  Pain is worse with: some activites Pain improves with: rest Relief from Meds: nA  Mobility ability to climb steps?  no do you drive?  no use a wheelchair transfers alone  Function disabled: date disabled . I need assistance with the following:  feeding, dressing, bathing and toileting  Neuro/Psych weakness  Prior Studies Any changes since last visit?  no  Physicians involved in your care Any changes since last visit?  no   Family History  Problem Relation Age of Onset  . Diabetes Mother   . Hemophilia Mother   . Breast cancer Sister   . Heart disease Father   . Colon polyps Father   . Diabetes Brother   . Colon polyps Sister     Social History   Socioeconomic History  . Marital status: Widowed    Spouse name: Not on file  . Number of children: 1  . Years of education: Not on file  . Highest education level: Not on file  Occupational History  . Occupation: Retired  Tobacco Use  . Smoking status: Former Smoker    Types: Cigarettes    Quit date: 08/12/2018    Years since quitting: 1.5  . Smokeless tobacco: Never Used  Substance and Sexual Activity  . Alcohol use: No    Alcohol/week: 0.0 standard drinks  . Drug use: No  . Sexual activity: Not on file  Other Topics Concern  . Not on file  Social History Narrative  . Not on file   Social Determinants of Health   Financial Resource Strain:   . Difficulty of Paying Living Expenses:   Food Insecurity:   . Worried About Charity fundraiser in the Last Year:   . Arboriculturist in the Last Year:   Transportation Needs: No Transportation Needs  . Lack of Transportation (Medical): No  . Lack of Transportation (Non-Medical): No  Physical Activity:   . Days of Exercise per Week:   . Minutes of Exercise per Session:   Stress:   . Feeling of Stress :   Social Connections:   . Frequency of  Communication with Friends and Family:   . Frequency of Social Gatherings with Friends and Family:   . Attends Religious Services:   . Active Member of Clubs or Organizations:   . Attends Archivist Meetings:   Marland Kitchen Marital Status:    Past Surgical History:  Procedure Laterality Date  . ANGIOPLASTY    . BUBBLE STUDY  09/14/2019   Procedure: BUBBLE STUDY;  Surgeon: Donato Heinz, MD;  Location: [place];  Service: Endoscopy;;  . CHOLECYSTECTOMY    . IR IVC FILTER PLMT / S&I /IMG GUID/MOD SED  09/24/2019  . ROTATOR CUFF REPAIR Right 04/2012  . TEE WITHOUT CARDIOVERSION N/A 09/14/2019   Procedure: TRANSESOPHAGEAL ECHOCARDIOGRAM (TEE);  Surgeon: Donato Heinz, MD;  Location[facility] ENDOSCOPY;  Service: Endoscopy;  Laterality: N/A;  . TUBAL  LIGATION     Past Medical History:  Diagnosis Date  . Aneurysm, thoracic aortic ([place])   . Anxiety   . Arthritis   . CAD (coronary artery disease)   . Chronic kidney disease    Renal insufficiency  . COPD (chronic obstructive pulmonary disease) ([place])   . Depression   . Diabetes mellitus   . Diverticulosis of colon (without mention of hemorrhage)   . Duodenitis without mention of hemorrhage   . Embolic stroke ([place])   . GERD (gastroesophageal reflux disease)   . Hemorrhoids   . Hypercholesteremia   . Hypertension   . Insomnia   . Pulmonary embolism ([place])   . Tinnitus   . Vertigo    BP 120/76   Pulse 92   Temp 97.6 F (36.4 C)   SpO2 (!) 88%   Opioid Risk Score:   Fall Risk Score:  `1  Depression screen PHQ 2/9  Depression screen [facility] 2/9 10/05/2018 05/04/2018 08/10/2017 11/15/2015 11/10/2013  Decreased Interest 1 0 0 1 0  Down, Depressed, Hopeless 0 0 0 0 0  PHQ - 2 Score 1 0 0 1 0  Altered sleeping - - - 2 -  Tired, decreased energy - - - 1 -  Change in appetite - - - 1 -  Feeling bad or failure about yourself  - - - 0 -  Trouble concentrating - - - 0 -  Moving slowly or fidgety/restless - - - 1 -  Suicidal thoughts - - - 0 -  PHQ-9 Score - - - 6 -  Difficult doing work/chores - - - Not difficult at all -  Some recent data might be hidden    Review of Systems  Neurological: Positive for weakness.  All other systems reviewed and are negative.      Objective:   Physical Exam Gen: no distress, in w/c HEENT: oral mucosa pink and moist, NCAT Cardio: Reg rate Chest: normal effort, normal rate of breathing Abd: soft, non-distended Ext: no edema Skin: intact Neuro: spastic left hemiparesis (3/4 wrist and elbow, pecs) 2-3 hamstrings, 0-tr/5 underneath Musculoskeletal: pain with PROM at left knee/shoulder and elbow Psych: pleasant, normal affect more up beat.         Assessment: 1.  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  Mood: Reactive depression and anxiety    3.  Questionable seizure episode:   -Continue Keppra   Plan:  1.  Patient status post Botox injections to the left elbow and wrist flexors in April.  Didn't get any therapy afterwards.   -needs to wear braces as much as possible  -needs  therapy to work on her range of motion on a daily basis.  I discussed some exercises that the family could work on as well with her therapy is not.  -can't tolerate significant doses of orals  -will repeat botox, try 500u to LUE in about a month 2. Continue lexapro 3. Continue keppra   15  minutes of time was spent with the patient in examination and formulation of the treatment plan.  Follow-up with me in about 1 month's time.

## 2020-03-02 DIAGNOSIS — M24571 Contracture, right ankle: Secondary | ICD-10-CM | POA: Diagnosis not present

## 2020-03-02 DIAGNOSIS — M6281 Muscle weakness (generalized): Secondary | ICD-10-CM | POA: Diagnosis not present

## 2020-03-02 DIAGNOSIS — G8111 Spastic hemiplegia affecting right dominant side: Secondary | ICD-10-CM | POA: Diagnosis not present

## 2020-03-02 DIAGNOSIS — I69054 Hemiplegia and hemiparesis following nontraumatic subarachnoid hemorrhage affecting left non-dominant side: Secondary | ICD-10-CM | POA: Diagnosis not present

## 2020-03-02 DIAGNOSIS — Z20828 Contact with and (suspected) exposure to other viral communicable diseases: Secondary | ICD-10-CM | POA: Diagnosis not present

## 2020-03-02 DIAGNOSIS — I69352 Hemiplegia and hemiparesis following cerebral infarction affecting left dominant side: Secondary | ICD-10-CM | POA: Diagnosis not present

## 2020-03-02 DIAGNOSIS — M6259 Muscle wasting and atrophy, not elsewhere classified, multiple sites: Secondary | ICD-10-CM | POA: Diagnosis not present

## 2020-03-05 DIAGNOSIS — G8111 Spastic hemiplegia affecting right dominant side: Secondary | ICD-10-CM | POA: Diagnosis not present

## 2020-03-05 DIAGNOSIS — M6281 Muscle weakness (generalized): Secondary | ICD-10-CM | POA: Diagnosis not present

## 2020-03-05 DIAGNOSIS — M6259 Muscle wasting and atrophy, not elsewhere classified, multiple sites: Secondary | ICD-10-CM | POA: Diagnosis not present

## 2020-03-05 DIAGNOSIS — I69352 Hemiplegia and hemiparesis following cerebral infarction affecting left dominant side: Secondary | ICD-10-CM | POA: Diagnosis not present

## 2020-03-05 DIAGNOSIS — M24571 Contracture, right ankle: Secondary | ICD-10-CM | POA: Diagnosis not present

## 2020-03-05 DIAGNOSIS — I69054 Hemiplegia and hemiparesis following nontraumatic subarachnoid hemorrhage affecting left non-dominant side: Secondary | ICD-10-CM | POA: Diagnosis not present

## 2020-03-06 DIAGNOSIS — G8111 Spastic hemiplegia affecting right dominant side: Secondary | ICD-10-CM | POA: Diagnosis not present

## 2020-03-06 DIAGNOSIS — M6259 Muscle wasting and atrophy, not elsewhere classified, multiple sites: Secondary | ICD-10-CM | POA: Diagnosis not present

## 2020-03-06 DIAGNOSIS — I69054 Hemiplegia and hemiparesis following nontraumatic subarachnoid hemorrhage affecting left non-dominant side: Secondary | ICD-10-CM | POA: Diagnosis not present

## 2020-03-06 DIAGNOSIS — M24571 Contracture, right ankle: Secondary | ICD-10-CM | POA: Diagnosis not present

## 2020-03-06 DIAGNOSIS — I69352 Hemiplegia and hemiparesis following cerebral infarction affecting left dominant side: Secondary | ICD-10-CM | POA: Diagnosis not present

## 2020-03-06 DIAGNOSIS — M6281 Muscle weakness (generalized): Secondary | ICD-10-CM | POA: Diagnosis not present

## 2020-03-07 DIAGNOSIS — M6281 Muscle weakness (generalized): Secondary | ICD-10-CM | POA: Diagnosis not present

## 2020-03-07 DIAGNOSIS — I69352 Hemiplegia and hemiparesis following cerebral infarction affecting left dominant side: Secondary | ICD-10-CM | POA: Diagnosis not present

## 2020-03-07 DIAGNOSIS — G8111 Spastic hemiplegia affecting right dominant side: Secondary | ICD-10-CM | POA: Diagnosis not present

## 2020-03-07 DIAGNOSIS — L309 Dermatitis, unspecified: Secondary | ICD-10-CM | POA: Diagnosis not present

## 2020-03-07 DIAGNOSIS — I69054 Hemiplegia and hemiparesis following nontraumatic subarachnoid hemorrhage affecting left non-dominant side: Secondary | ICD-10-CM | POA: Diagnosis not present

## 2020-03-07 DIAGNOSIS — M6259 Muscle wasting and atrophy, not elsewhere classified, multiple sites: Secondary | ICD-10-CM | POA: Diagnosis not present

## 2020-03-07 DIAGNOSIS — M24571 Contracture, right ankle: Secondary | ICD-10-CM | POA: Diagnosis not present

## 2020-03-08 DIAGNOSIS — M6281 Muscle weakness (generalized): Secondary | ICD-10-CM | POA: Diagnosis not present

## 2020-03-08 DIAGNOSIS — M24571 Contracture, right ankle: Secondary | ICD-10-CM | POA: Diagnosis not present

## 2020-03-08 DIAGNOSIS — I69054 Hemiplegia and hemiparesis following nontraumatic subarachnoid hemorrhage affecting left non-dominant side: Secondary | ICD-10-CM | POA: Diagnosis not present

## 2020-03-08 DIAGNOSIS — G8111 Spastic hemiplegia affecting right dominant side: Secondary | ICD-10-CM | POA: Diagnosis not present

## 2020-03-08 DIAGNOSIS — M6259 Muscle wasting and atrophy, not elsewhere classified, multiple sites: Secondary | ICD-10-CM | POA: Diagnosis not present

## 2020-03-08 DIAGNOSIS — I69352 Hemiplegia and hemiparesis following cerebral infarction affecting left dominant side: Secondary | ICD-10-CM | POA: Diagnosis not present

## 2020-03-09 DIAGNOSIS — Z20822 Contact with and (suspected) exposure to covid-19: Secondary | ICD-10-CM | POA: Diagnosis not present

## 2020-03-09 DIAGNOSIS — I69352 Hemiplegia and hemiparesis following cerebral infarction affecting left dominant side: Secondary | ICD-10-CM | POA: Diagnosis not present

## 2020-03-09 DIAGNOSIS — M6259 Muscle wasting and atrophy, not elsewhere classified, multiple sites: Secondary | ICD-10-CM | POA: Diagnosis not present

## 2020-03-09 DIAGNOSIS — M24571 Contracture, right ankle: Secondary | ICD-10-CM | POA: Diagnosis not present

## 2020-03-09 DIAGNOSIS — G8111 Spastic hemiplegia affecting right dominant side: Secondary | ICD-10-CM | POA: Diagnosis not present

## 2020-03-09 DIAGNOSIS — M6281 Muscle weakness (generalized): Secondary | ICD-10-CM | POA: Diagnosis not present

## 2020-03-09 DIAGNOSIS — I69054 Hemiplegia and hemiparesis following nontraumatic subarachnoid hemorrhage affecting left non-dominant side: Secondary | ICD-10-CM | POA: Diagnosis not present

## 2020-03-10 DIAGNOSIS — M6259 Muscle wasting and atrophy, not elsewhere classified, multiple sites: Secondary | ICD-10-CM | POA: Diagnosis not present

## 2020-03-10 DIAGNOSIS — I69054 Hemiplegia and hemiparesis following nontraumatic subarachnoid hemorrhage affecting left non-dominant side: Secondary | ICD-10-CM | POA: Diagnosis not present

## 2020-03-10 DIAGNOSIS — I69352 Hemiplegia and hemiparesis following cerebral infarction affecting left dominant side: Secondary | ICD-10-CM | POA: Diagnosis not present

## 2020-03-10 DIAGNOSIS — M6281 Muscle weakness (generalized): Secondary | ICD-10-CM | POA: Diagnosis not present

## 2020-03-10 DIAGNOSIS — G8111 Spastic hemiplegia affecting right dominant side: Secondary | ICD-10-CM | POA: Diagnosis not present

## 2020-03-10 DIAGNOSIS — M24571 Contracture, right ankle: Secondary | ICD-10-CM | POA: Diagnosis not present

## 2020-03-12 DIAGNOSIS — I69391 Dysphagia following cerebral infarction: Secondary | ICD-10-CM | POA: Diagnosis not present

## 2020-03-12 DIAGNOSIS — G8111 Spastic hemiplegia affecting right dominant side: Secondary | ICD-10-CM | POA: Diagnosis not present

## 2020-03-12 DIAGNOSIS — M6259 Muscle wasting and atrophy, not elsewhere classified, multiple sites: Secondary | ICD-10-CM | POA: Diagnosis not present

## 2020-03-12 DIAGNOSIS — M24571 Contracture, right ankle: Secondary | ICD-10-CM | POA: Diagnosis not present

## 2020-03-12 DIAGNOSIS — I69054 Hemiplegia and hemiparesis following nontraumatic subarachnoid hemorrhage affecting left non-dominant side: Secondary | ICD-10-CM | POA: Diagnosis not present

## 2020-03-12 DIAGNOSIS — R278 Other lack of coordination: Secondary | ICD-10-CM | POA: Diagnosis not present

## 2020-03-12 DIAGNOSIS — R4701 Aphasia: Secondary | ICD-10-CM | POA: Diagnosis not present

## 2020-03-12 DIAGNOSIS — M6281 Muscle weakness (generalized): Secondary | ICD-10-CM | POA: Diagnosis not present

## 2020-03-12 DIAGNOSIS — I69352 Hemiplegia and hemiparesis following cerebral infarction affecting left dominant side: Secondary | ICD-10-CM | POA: Diagnosis not present

## 2020-03-12 DIAGNOSIS — M24572 Contracture, left ankle: Secondary | ICD-10-CM | POA: Diagnosis not present

## 2020-03-13 ENCOUNTER — Encounter: Payer: Medicare Other | Admitting: Family Medicine

## 2020-03-13 DIAGNOSIS — M6281 Muscle weakness (generalized): Secondary | ICD-10-CM | POA: Diagnosis not present

## 2020-03-13 DIAGNOSIS — G8111 Spastic hemiplegia affecting right dominant side: Secondary | ICD-10-CM | POA: Diagnosis not present

## 2020-03-13 DIAGNOSIS — M24571 Contracture, right ankle: Secondary | ICD-10-CM | POA: Diagnosis not present

## 2020-03-13 DIAGNOSIS — M6259 Muscle wasting and atrophy, not elsewhere classified, multiple sites: Secondary | ICD-10-CM | POA: Diagnosis not present

## 2020-03-13 DIAGNOSIS — I69352 Hemiplegia and hemiparesis following cerebral infarction affecting left dominant side: Secondary | ICD-10-CM | POA: Diagnosis not present

## 2020-03-13 DIAGNOSIS — I69054 Hemiplegia and hemiparesis following nontraumatic subarachnoid hemorrhage affecting left non-dominant side: Secondary | ICD-10-CM | POA: Diagnosis not present

## 2020-03-14 DIAGNOSIS — M24571 Contracture, right ankle: Secondary | ICD-10-CM | POA: Diagnosis not present

## 2020-03-14 DIAGNOSIS — M6259 Muscle wasting and atrophy, not elsewhere classified, multiple sites: Secondary | ICD-10-CM | POA: Diagnosis not present

## 2020-03-14 DIAGNOSIS — G8111 Spastic hemiplegia affecting right dominant side: Secondary | ICD-10-CM | POA: Diagnosis not present

## 2020-03-14 DIAGNOSIS — M6281 Muscle weakness (generalized): Secondary | ICD-10-CM | POA: Diagnosis not present

## 2020-03-14 DIAGNOSIS — I69054 Hemiplegia and hemiparesis following nontraumatic subarachnoid hemorrhage affecting left non-dominant side: Secondary | ICD-10-CM | POA: Diagnosis not present

## 2020-03-14 DIAGNOSIS — I69352 Hemiplegia and hemiparesis following cerebral infarction affecting left dominant side: Secondary | ICD-10-CM | POA: Diagnosis not present

## 2020-03-15 DIAGNOSIS — M6281 Muscle weakness (generalized): Secondary | ICD-10-CM | POA: Diagnosis not present

## 2020-03-15 DIAGNOSIS — I69054 Hemiplegia and hemiparesis following nontraumatic subarachnoid hemorrhage affecting left non-dominant side: Secondary | ICD-10-CM | POA: Diagnosis not present

## 2020-03-15 DIAGNOSIS — I69352 Hemiplegia and hemiparesis following cerebral infarction affecting left dominant side: Secondary | ICD-10-CM | POA: Diagnosis not present

## 2020-03-15 DIAGNOSIS — M6259 Muscle wasting and atrophy, not elsewhere classified, multiple sites: Secondary | ICD-10-CM | POA: Diagnosis not present

## 2020-03-15 DIAGNOSIS — M24571 Contracture, right ankle: Secondary | ICD-10-CM | POA: Diagnosis not present

## 2020-03-15 DIAGNOSIS — G8111 Spastic hemiplegia affecting right dominant side: Secondary | ICD-10-CM | POA: Diagnosis not present

## 2020-03-16 DIAGNOSIS — M6281 Muscle weakness (generalized): Secondary | ICD-10-CM | POA: Diagnosis not present

## 2020-03-16 DIAGNOSIS — I69054 Hemiplegia and hemiparesis following nontraumatic subarachnoid hemorrhage affecting left non-dominant side: Secondary | ICD-10-CM | POA: Diagnosis not present

## 2020-03-16 DIAGNOSIS — M6259 Muscle wasting and atrophy, not elsewhere classified, multiple sites: Secondary | ICD-10-CM | POA: Diagnosis not present

## 2020-03-16 DIAGNOSIS — G8111 Spastic hemiplegia affecting right dominant side: Secondary | ICD-10-CM | POA: Diagnosis not present

## 2020-03-16 DIAGNOSIS — M24571 Contracture, right ankle: Secondary | ICD-10-CM | POA: Diagnosis not present

## 2020-03-16 DIAGNOSIS — I69352 Hemiplegia and hemiparesis following cerebral infarction affecting left dominant side: Secondary | ICD-10-CM | POA: Diagnosis not present

## 2020-03-19 DIAGNOSIS — I69054 Hemiplegia and hemiparesis following nontraumatic subarachnoid hemorrhage affecting left non-dominant side: Secondary | ICD-10-CM | POA: Diagnosis not present

## 2020-03-19 DIAGNOSIS — M24571 Contracture, right ankle: Secondary | ICD-10-CM | POA: Diagnosis not present

## 2020-03-19 DIAGNOSIS — M6259 Muscle wasting and atrophy, not elsewhere classified, multiple sites: Secondary | ICD-10-CM | POA: Diagnosis not present

## 2020-03-19 DIAGNOSIS — M6281 Muscle weakness (generalized): Secondary | ICD-10-CM | POA: Diagnosis not present

## 2020-03-19 DIAGNOSIS — G8111 Spastic hemiplegia affecting right dominant side: Secondary | ICD-10-CM | POA: Diagnosis not present

## 2020-03-19 DIAGNOSIS — I69352 Hemiplegia and hemiparesis following cerebral infarction affecting left dominant side: Secondary | ICD-10-CM | POA: Diagnosis not present

## 2020-03-19 DIAGNOSIS — F338 Other recurrent depressive disorders: Secondary | ICD-10-CM | POA: Diagnosis not present

## 2020-03-19 DIAGNOSIS — G4700 Insomnia, unspecified: Secondary | ICD-10-CM | POA: Diagnosis not present

## 2020-03-20 DIAGNOSIS — I69352 Hemiplegia and hemiparesis following cerebral infarction affecting left dominant side: Secondary | ICD-10-CM | POA: Diagnosis not present

## 2020-03-20 DIAGNOSIS — I69054 Hemiplegia and hemiparesis following nontraumatic subarachnoid hemorrhage affecting left non-dominant side: Secondary | ICD-10-CM | POA: Diagnosis not present

## 2020-03-20 DIAGNOSIS — M24571 Contracture, right ankle: Secondary | ICD-10-CM | POA: Diagnosis not present

## 2020-03-20 DIAGNOSIS — M6259 Muscle wasting and atrophy, not elsewhere classified, multiple sites: Secondary | ICD-10-CM | POA: Diagnosis not present

## 2020-03-20 DIAGNOSIS — M6281 Muscle weakness (generalized): Secondary | ICD-10-CM | POA: Diagnosis not present

## 2020-03-20 DIAGNOSIS — G8111 Spastic hemiplegia affecting right dominant side: Secondary | ICD-10-CM | POA: Diagnosis not present

## 2020-03-21 DIAGNOSIS — M6281 Muscle weakness (generalized): Secondary | ICD-10-CM | POA: Diagnosis not present

## 2020-03-21 DIAGNOSIS — M6259 Muscle wasting and atrophy, not elsewhere classified, multiple sites: Secondary | ICD-10-CM | POA: Diagnosis not present

## 2020-03-21 DIAGNOSIS — I69352 Hemiplegia and hemiparesis following cerebral infarction affecting left dominant side: Secondary | ICD-10-CM | POA: Diagnosis not present

## 2020-03-21 DIAGNOSIS — G8111 Spastic hemiplegia affecting right dominant side: Secondary | ICD-10-CM | POA: Diagnosis not present

## 2020-03-21 DIAGNOSIS — I69054 Hemiplegia and hemiparesis following nontraumatic subarachnoid hemorrhage affecting left non-dominant side: Secondary | ICD-10-CM | POA: Diagnosis not present

## 2020-03-21 DIAGNOSIS — M24571 Contracture, right ankle: Secondary | ICD-10-CM | POA: Diagnosis not present

## 2020-03-22 ENCOUNTER — Ambulatory Visit (INDEPENDENT_AMBULATORY_CARE_PROVIDER_SITE_OTHER): Payer: Medicare Other | Admitting: Family Medicine

## 2020-03-22 ENCOUNTER — Other Ambulatory Visit: Payer: Self-pay

## 2020-03-22 VITALS — Temp 95.8°F

## 2020-03-22 DIAGNOSIS — Z09 Encounter for follow-up examination after completed treatment for conditions other than malignant neoplasm: Secondary | ICD-10-CM | POA: Diagnosis not present

## 2020-03-22 DIAGNOSIS — G8111 Spastic hemiplegia affecting right dominant side: Secondary | ICD-10-CM | POA: Diagnosis not present

## 2020-03-22 DIAGNOSIS — R7309 Other abnormal glucose: Secondary | ICD-10-CM | POA: Diagnosis not present

## 2020-03-22 DIAGNOSIS — Q2112 Patent foramen ovale: Secondary | ICD-10-CM

## 2020-03-22 DIAGNOSIS — R569 Unspecified convulsions: Secondary | ICD-10-CM

## 2020-03-22 DIAGNOSIS — M6281 Muscle weakness (generalized): Secondary | ICD-10-CM | POA: Diagnosis not present

## 2020-03-22 DIAGNOSIS — Q211 Atrial septal defect: Secondary | ICD-10-CM | POA: Diagnosis not present

## 2020-03-22 DIAGNOSIS — I63429 Cerebral infarction due to embolism of unspecified anterior cerebral artery: Secondary | ICD-10-CM

## 2020-03-22 DIAGNOSIS — I10 Essential (primary) hypertension: Secondary | ICD-10-CM | POA: Diagnosis not present

## 2020-03-22 DIAGNOSIS — I6930 Unspecified sequelae of cerebral infarction: Secondary | ICD-10-CM

## 2020-03-22 DIAGNOSIS — M24571 Contracture, right ankle: Secondary | ICD-10-CM | POA: Diagnosis not present

## 2020-03-22 DIAGNOSIS — I2694 Multiple subsegmental pulmonary emboli without acute cor pulmonale: Secondary | ICD-10-CM | POA: Diagnosis not present

## 2020-03-22 DIAGNOSIS — I69352 Hemiplegia and hemiparesis following cerebral infarction affecting left dominant side: Secondary | ICD-10-CM | POA: Diagnosis not present

## 2020-03-22 DIAGNOSIS — J449 Chronic obstructive pulmonary disease, unspecified: Secondary | ICD-10-CM | POA: Diagnosis not present

## 2020-03-22 DIAGNOSIS — I69054 Hemiplegia and hemiparesis following nontraumatic subarachnoid hemorrhage affecting left non-dominant side: Secondary | ICD-10-CM | POA: Diagnosis not present

## 2020-03-22 DIAGNOSIS — I6389 Other cerebral infarction: Secondary | ICD-10-CM

## 2020-03-22 DIAGNOSIS — M6259 Muscle wasting and atrophy, not elsewhere classified, multiple sites: Secondary | ICD-10-CM | POA: Diagnosis not present

## 2020-03-22 MED ORDER — BACLOFEN 5 MG PO TABS: 5.0000 mg | ORAL_TABLET | Freq: Three times a day (TID) | ORAL | 0 refills | Status: AC

## 2020-03-22 NOTE — Progress Notes (Signed)
Subjective:    Patient ID: [name] [name], female    DOB: 1951-02-03, 69 y.o.   MRN: [id number]  HPI I have not seen the patient since January.  At that time she suffered several strokes and was admitted to the hospital for a prolonged period of time.  Ultimately this culminated in the patient being placed in a skilled nursing facility.  I have copied the discharge summary from February 26 which provides a concise summary of the events that led up to her discharge to a skilled nursing facility:  Brief HPI:   [name] [name] is a 69 y.o. right-handed female with history of CAD with angioplasty, CKD stage II, COPD with chronic hypoxemic respiratory failure maintained on 3 L of oxygen at home at night, type 2 diabetes mellitus, recent shoulder fracture nonsurgical managed with a sling, hypertension, hyperlipidemia, thoracic aortic aneurysm, patient quit smoking 12 months ago.  Patient had admission 08/18/2019 to 08/20/2019 for acute CVA with MRI showing watershed infarction in bilateral hemispheres.  CT angiogram of head and neck revealed 50% stenosis of the proximal right internal carotid artery due to bulky atherosclerotic calcification.  Carotid Dopplers with no ICA stenosis.  TTE with normal ejection fraction.  Patient was discharged home on aspirin and Plavix x3 months then Plavix alone.  On 08/23/2019 hospital evaluation by PCP noted to have shortness of breath where CT angiogram of the chest showed acute bilateral segmental subsegmental pulmonary emboli.  Subsequent DVT scan showed right lower extremity DVT involving the right peroneal vein.  Patient started on Xarelto replacing aspirin and Plavix.  On 08/30/2019 bubble study done by outpatient neurology showed a small PFO but it was not felt large enough to allow for emboli to pass from right to left side of heart.  Patient presented to the ED 09/07/2019 due to left hemiparesis.  CT of the head as well as MRI of the brain showed evolution of old stroke  without new infarction.  As result, patient was subsequent discharged home.  She presented on 09/11/2019 with increasing left-sided weakness as well as hypotension.  She did receive a fluid bolus.  MRI and imaging revealed continued interval evolution of previously seen subacute infarcts with multiple new scattered acute ischemic watershed type infarctions involving the bilateral cerebral hemispheres right greater than left.  Associated petechial hemorrhage about a few of these infarcts without frank hemorrhagic transformation.  Small volume subarachnoid hemorrhage layering along the cortical sulcus within the right frontal lobe, new from previous tracing.  Neurology consulted hypercoagulable work-up patient Xarelto changed to Eliquis given new infarctions and pulmonary emboli.  A follow-up CT of the head completed for increasing left side weakness reviewed showed no acute changes.  However given current deficits suspect further extension discussed with neurology services, PFO and plans for possible intervention cardiology follow-up in regards to recent findings of PFO.  TEE completed positive for PFO.  Patient was admitted for a comprehensive rehab program.   Hospital Course: [name] [name] was admitted to rehab 09/14/2019 for inpatient therapies to consist of PT, ST and OT at least three hours five days a week. Past admission physiatrist, therapy team and rehab RN have worked together to provide customized collaborative inpatient rehab.  Pertaining to patient's by cerebral hemispheric watershed infarctions with right frontal subarachnoid hemorrhage.  Patient with bilateral cerebral and occipital infarcts as well as pulmonary emboli with DVT.  Most recent MRI revealed progression of bifrontal infarction as well as new small infarct right lateral occipital  lobe.  There was question venous source given PFO and lack of other obvious etiologies I VCF placed as recommended by neurology services 09/24/2019 right IJ  route.  Patient did remain on Eliquis with no bleeding episodes.  Increased spasticity noted consideration of Botox as outpatient she was fitted with an elbow splint to maintain extension.  Pain managed with the use of Lidoderm patch as well as tramadol.  Patient had been on hydrocodone but discontinued due to lethargy.  She continued on Requip for restless leg syndrome monitoring of sedation.  Mood stabilization with Valium as needed for anxiety she had been on this prior to admission as well as scheduled Lexapro.  She was using melatonin to help aid in her sleep.  Her blood pressure monitor closely for any orthostasis as she continued on Cozaar 75 mg daily as well as Cardizem and low-dose Lasix.  Patient unresponsive episode 09/27/2019 there was question of seizure EEG negative she was placed on Keppra for seizure prophylaxis recommendations by neurology service to continue as directed.  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  Diabetes mellitus hemoglobin A1c 9.4 she continued to be maintained on Glucophage.  She was tolerating a regular consistency diet.    I reviewed recent A/P from her PMR specialist Dr. Naaman Plummer:  Assessment: 1.  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  Mood: Reactive depression and anxiety               3.  Questionable seizure episode:                   -Continue Keppra  Plan:  1.  Patient status post Botox injections to the left elbow and wrist flexors in April.  Didn't get any therapy afterwards.              -needs to wear braces as much as possible             -needs therapy to work on her range of motion on a daily basis.  I discussed some exercises that the family could work on as well with her therapy is not.             -can't tolerate significant doses of orals             -will repeat botox, try 500u to LUE in about a month 2.  Continue lexapro 3. Continue keppra  Patient is here today with her son.  She is confined to a wheelchair.  She is unable to move her upper extremities or lower extremities.  She has a flexor contraction of her left wrist.  Her right arm is resting on a support with elbow bent at 90 degrees but has passive range of motion in the elbow shoulder and wrist.  I reviewed her medication list.  Her medication list is correct except for Lexapro is actually 5 mg a day.  She is also taking baclofen 5 mg 3 times a day.  I question if she needs to continue taking Keppra.  She has had an EEG in the hospital that showed no seizure activity and a follow-up EEG performed at her neurologist office that showed no seizure activity.  I question if the patient had a syncopal episode and had seizure activity related to syncope due to cerebral hypoxia from hypotension.  Therefore I want to reach out to her neurologist to determine if  she needs to continue Keppra.  She is also on Lasix and K. Dur however today on her exam there is no pitting edema in her legs.  Her lungs are clear to auscultation bilaterally.  She denies any shortness of breath or chest pain.  She states that they are checking her blood sugars daily and that they are always less than 100.  She denies any polyuria, polydipsia, or blurry vision.  Patient has a physician who is caring for her in the nursing home.  However he is wanting to try to transfer his mother to a nursing home closer to their family.  He is concerned because he does not feel that she is getting appropriate amounts of physical therapy at the nursing home.  Patient states that they are working with her once a week.  Dr. Tessa Lerner had recommended daily range of motion exercises to avoid contractures.  Patient never went for PFO closure however she did have an IVC filter placed. Past Medical History:  Diagnosis Date  . Aneurysm, thoracic aortic ([place])   . Anxiety   . Arthritis   . CAD (coronary  artery disease)   . Chronic kidney disease    Renal insufficiency  . COPD (chronic obstructive pulmonary disease) ([place])   . Depression   . Diabetes mellitus   . Diverticulosis of colon (without mention of hemorrhage)   . Duodenitis without mention of hemorrhage   . Embolic stroke ([place])   . GERD (gastroesophageal reflux disease)   . Hemorrhoids   . Hypercholesteremia   . Hypertension   . Insomnia   . Pulmonary embolism ([place])   . Tinnitus   . Vertigo    Past Surgical History:  Procedure Laterality Date  . ANGIOPLASTY    . BUBBLE STUDY  09/14/2019   Procedure: BUBBLE STUDY;  Surgeon: Donato Heinz, MD;  Location: [place];  Service: Endoscopy;;  . CHOLECYSTECTOMY    . IR IVC FILTER PLMT / S&I /IMG GUID/MOD SED  09/24/2019  . ROTATOR CUFF REPAIR Right 04/2012  . TEE WITHOUT CARDIOVERSION N/A 09/14/2019   Procedure: TRANSESOPHAGEAL ECHOCARDIOGRAM (TEE);  Surgeon: Donato Heinz, MD;  Location: [facility] ENDOSCOPY;  Service: Endoscopy;  Laterality: N/A;  . TUBAL LIGATION     Current Outpatient Medications on File Prior to Visit  Medication Sig Dispense Refill  . acetaminophen (TYLENOL) 325 MG tablet Take 2 tablets (650 mg total) by mouth every 4 (four) hours as needed for mild pain (or temp > 37.5 C (99.5 F)).    Marland Kitchen albuterol (VENTOLIN HFA) 108 (90 Base) MCG/ACT inhaler Inhale 2 puffs into the lungs every 4 (four) hours as needed for wheezing or shortness of breath. 54 g 3  . apixaban (ELIQUIS) 5 MG TABS tablet Take 5 mg by mouth 2 (two) times daily.    Marland Kitchen atorvastatin (LIPITOR) 20 MG tablet Take 20 mg by mouth at bedtime.    . bimatoprost (LUMIGAN) 0.01 % SOLN Place 1 drop into both eyes at bedtime.    . brinzolamide (AZOPT) 1 % ophthalmic suspension Place 1 drop into both eyes every 12 (twelve) hours.     Marland Kitchen diltiazem (CARDIZEM) 120 MG tablet Take 120 mg by mouth daily.    Marland Kitchen escitalopram (LEXAPRO) 10 MG tablet Take 1 tablet (10 mg total) by mouth daily. 90 tablet 3  .  furosemide (LASIX) 20 MG tablet Take 1 tablet (20 mg total) by mouth daily. 30 tablet   . levETIRAcetam (KEPPRA) 1000 MG tablet Take 1 tablet (  1,000 mg total) by mouth 2 (two) times daily.    Marland Kitchen lidocaine (LIDODERM) 5 % Place 1 patch onto the skin daily. Remove & Discard patch within 12 hours or as directed by MD 5 patch 0  . losartan (COZAAR) 25 MG tablet Take 3 tablets (75 mg total) by mouth daily.    . Melatonin 3 MG TABS Take 1 tablet (3 mg total) by mouth at bedtime.  0  . metFORMIN (GLUCOPHAGE) 500 MG tablet Take 1 tablet (500 mg total) by mouth 2 (two) times daily with a meal.    . nitrofurantoin, macrocrystal-monohydrate, (MACROBID) 100 MG capsule Take 100 mg by mouth 2 (two) times daily.    . OXYGEN Inhale 2 L into the lungs at bedtime.     Marland Kitchen rOPINIRole (REQUIP) 2 MG tablet Take 1 tablet (2 mg total) by mouth 3 (three) times daily. 270 tablet 3  . traMADol (ULTRAM) 50 MG tablet Take 0.5 tablets (25 mg total) by mouth every 12 (twelve) hours as needed for severe pain. 5 tablet 0  . tuberculin (TUBERSOL) 5 UNIT/0.1ML injection Inject into the skin every 14 (fourteen) days.    . Vitamin D, Ergocalciferol, (DRISDOL) 1.25 MG (50000 UNIT) CAPS capsule TAKE 1 CAPSULE BY MOUTH ONE TIME PER WEEK 12 capsule 1   No current facility-administered medications on file prior to visit.   Allergies  Allergen Reactions  . Guaifenesin & Derivatives Anaphylaxis    Severe Reaction  . Tape Other (See Comments)    TAPE PEELS OFF THE SKIN!!!!!!!  . Zanaflex [Tizanidine Hcl] Other (See Comments)    Seizure activity  . Benazepril Cough  . Morphine Nausea Only  . Zyrtec [Cetirizine] Swelling and Other (See Comments)    Hands and feet became swollen  . Latex Rash and Other (See Comments)    No powdered gloves!!   Social History   Socioeconomic History  . Marital status: Widowed    Spouse name: Not on file  . Number of children: 1  . Years of education: Not on file  . Highest education level: Not on  file  Occupational History  . Occupation: Retired  Tobacco Use  . Smoking status: Former Smoker    Types: Cigarettes    Quit date: 08/12/2018    Years since quitting: 1.6  . Smokeless tobacco: Never Used  Substance and Sexual Activity  . Alcohol use: No    Alcohol/week: 0.0 standard drinks  . Drug use: No  . Sexual activity: Not on file  Other Topics Concern  . Not on file  Social History Narrative  . Not on file   Social Determinants of Health   Financial Resource Strain:   . Difficulty of Paying Living Expenses:   Food Insecurity:   . Worried About Charity fundraiser in the Last Year:   . Arboriculturist in the Last Year:   Transportation Needs: No Transportation Needs  . Lack of Transportation (Medical): No  . Lack of Transportation (Non-Medical): No  Physical Activity:   . Days of Exercise per Week:   . Minutes of Exercise per Session:   Stress:   . Feeling of Stress :   Social Connections:   . Frequency of Communication with Friends and Family:   . Frequency of Social Gatherings with Friends and Family:   . Attends Religious Services:   . Active Member of Clubs or Organizations:   . Attends Archivist Meetings:   Marland Kitchen Marital Status:  Intimate Partner Violence:   . Fear of Current or Ex-Partner:   . Emotionally Abused:   Marland Kitchen Physically Abused:   . Sexually Abused:      Review of Systems  All other systems reviewed and are negative.      Objective:   Physical Exam Vitals reviewed.  Constitutional:      General: She is not in acute distress.    Appearance: She is obese. She is not ill-appearing, toxic-appearing or diaphoretic.  Cardiovascular:     Rate and Rhythm: Normal rate and regular rhythm.     Pulses: Normal pulses.     Heart sounds: Normal heart sounds. No murmur heard.  No friction rub. No gallop.   Pulmonary:     Effort: Pulmonary effort is normal. No respiratory distress.     Breath sounds: No wheezing, rhonchi or rales.    Abdominal:     General: Bowel sounds are normal.     Palpations: Abdomen is soft.  Musculoskeletal:     Right lower leg: No edema.     Left lower leg: No edema.  Neurological:     Mental Status: She is alert and oriented to person, place, and time.     Motor: Weakness present.     Coordination: Coordination abnormal.     Gait: Gait abnormal.   Patient has right gaze preference.  She is confined to a wheelchair.  She is unable to move her left arm or her left leg.  She has a flexor contraction of the left elbow and left wrist.  She also has weakness in the right arm and right leg.        Assessment & Plan:  Hospital discharge follow-up - Plan: Hemoglobin A1c, CBC with Differential/Platelet, COMPLETE METABOLIC PANEL WITH GFR  Multiple subsegmental pulmonary emboli without acute cor pulmonale (HCC)  Cerebrovascular accident (CVA) due to embolism of anterior cerebral artery, unspecified blood vessel laterality (HCC)  Chronic obstructive pulmonary disease, unspecified COPD type ([place])  Essential hypertension  Seizures (HCC)  PFO (patent foramen ovale)  Acute bilat watershed infarction ([place])  Obtain CBC, CMP, A1c to assess management of her diabetes.  Goal A1c is less than 7.  Continue Eliquis indefinitely due to IVC filter, history of pulmonary embolism, history of hypercoagulable state with multiple bilateral watershed infarctions.  Contact her neurologist to determine if he wants her to continue [place] given her normal EEG to avoid polypharmacy.  PFO has been managed by the placement of a IVC filter.  Patient could not tolerate surgical closure.  Blood pressure today is adequately controlled.  Will contact nursing home in [place] per patient request and check on bed availability.

## 2020-03-23 DIAGNOSIS — Z20828 Contact with and (suspected) exposure to other viral communicable diseases: Secondary | ICD-10-CM | POA: Diagnosis not present

## 2020-03-23 DIAGNOSIS — M6281 Muscle weakness (generalized): Secondary | ICD-10-CM | POA: Diagnosis not present

## 2020-03-23 DIAGNOSIS — I69352 Hemiplegia and hemiparesis following cerebral infarction affecting left dominant side: Secondary | ICD-10-CM | POA: Diagnosis not present

## 2020-03-23 DIAGNOSIS — G8111 Spastic hemiplegia affecting right dominant side: Secondary | ICD-10-CM | POA: Diagnosis not present

## 2020-03-23 DIAGNOSIS — M24571 Contracture, right ankle: Secondary | ICD-10-CM | POA: Diagnosis not present

## 2020-03-23 DIAGNOSIS — M6259 Muscle wasting and atrophy, not elsewhere classified, multiple sites: Secondary | ICD-10-CM | POA: Diagnosis not present

## 2020-03-23 DIAGNOSIS — I69054 Hemiplegia and hemiparesis following nontraumatic subarachnoid hemorrhage affecting left non-dominant side: Secondary | ICD-10-CM | POA: Diagnosis not present

## 2020-03-23 LAB — CBC WITH DIFFERENTIAL/PLATELET
Absolute Monocytes: 677 cells/uL (ref 200–950)
Basophils Absolute: 61 cells/uL (ref 0–200)
Basophils Relative: 0.6 %
Eosinophils Absolute: 242 cells/uL (ref 15–500)
Eosinophils Relative: 2.4 %
HCT: 40.8 % (ref 35.0–45.0)
Hemoglobin: 13.6 g/dL (ref 11.7–15.5)
Lymphs Abs: 2727 cells/uL (ref 850–3900)
MCH: 31.9 pg (ref 27.0–33.0)
MCHC: 33.3 g/dL (ref 32.0–36.0)
MCV: 95.6 fL (ref 80.0–100.0)
MPV: 10.2 fL (ref 7.5–12.5)
Monocytes Relative: 6.7 %
Neutro Abs: 6393 cells/uL (ref 1500–7800)
Neutrophils Relative %: 63.3 %
Platelets: 420 10*3/uL — ABNORMAL HIGH (ref 140–400)
RBC: 4.27 10*6/uL (ref 3.80–5.10)
RDW: 12.6 % (ref 11.0–15.0)
Total Lymphocyte: 27 %
WBC: 10.1 10*3/uL (ref 3.8–10.8)

## 2020-03-23 LAB — COMPLETE METABOLIC PANEL WITH GFR
AG Ratio: 1.4 (calc) (ref 1.0–2.5)
ALT: 13 U/L (ref 6–29)
AST: 13 U/L (ref 10–35)
Albumin: 3.7 g/dL (ref 3.6–5.1)
Alkaline phosphatase (APISO): 41 U/L (ref 37–153)
BUN: 20 mg/dL (ref 7–25)
CO2: 31 mmol/L (ref 20–32)
Calcium: 9.7 mg/dL (ref 8.6–10.4)
Chloride: 100 mmol/L (ref 98–110)
Creat: 0.67 mg/dL (ref 0.60–0.93)
GFR, Est African American: 103 mL/min/{1.73_m2} (ref >=60)
GFR, Est Non African American: 89 mL/min/{1.73_m2} (ref >=60)
Globulin: 2.7 g/dL (calc) (ref 1.9–3.7)
Glucose, Bld: 157 mg/dL — ABNORMAL HIGH (ref 65–99)
Potassium: 4.3 mmol/L (ref 3.5–5.3)
Sodium: 138 mmol/L (ref 135–146)
Total Bilirubin: 0.2 mg/dL (ref 0.2–1.2)
Total Protein: 6.4 g/dL (ref 6.1–8.1)

## 2020-03-23 LAB — HEMOGLOBIN A1C
Hgb A1c MFr Bld: 6.2 % of total Hgb — ABNORMAL HIGH (ref <5.7)
Mean Plasma Glucose: 131 (calc)
eAG (mmol/L): 7.3 (calc)

## 2020-03-26 ENCOUNTER — Ambulatory Visit: Payer: Medicare Other | Admitting: Family Medicine

## 2020-03-26 DIAGNOSIS — M6259 Muscle wasting and atrophy, not elsewhere classified, multiple sites: Secondary | ICD-10-CM | POA: Diagnosis not present

## 2020-03-26 DIAGNOSIS — M24571 Contracture, right ankle: Secondary | ICD-10-CM | POA: Diagnosis not present

## 2020-03-26 DIAGNOSIS — G8111 Spastic hemiplegia affecting right dominant side: Secondary | ICD-10-CM | POA: Diagnosis not present

## 2020-03-26 DIAGNOSIS — I69352 Hemiplegia and hemiparesis following cerebral infarction affecting left dominant side: Secondary | ICD-10-CM | POA: Diagnosis not present

## 2020-03-26 DIAGNOSIS — M6281 Muscle weakness (generalized): Secondary | ICD-10-CM | POA: Diagnosis not present

## 2020-03-26 DIAGNOSIS — I69054 Hemiplegia and hemiparesis following nontraumatic subarachnoid hemorrhage affecting left non-dominant side: Secondary | ICD-10-CM | POA: Diagnosis not present

## 2020-03-27 DIAGNOSIS — M24571 Contracture, right ankle: Secondary | ICD-10-CM | POA: Diagnosis not present

## 2020-03-27 DIAGNOSIS — G8111 Spastic hemiplegia affecting right dominant side: Secondary | ICD-10-CM | POA: Diagnosis not present

## 2020-03-27 DIAGNOSIS — I69352 Hemiplegia and hemiparesis following cerebral infarction affecting left dominant side: Secondary | ICD-10-CM | POA: Diagnosis not present

## 2020-03-27 DIAGNOSIS — M6259 Muscle wasting and atrophy, not elsewhere classified, multiple sites: Secondary | ICD-10-CM | POA: Diagnosis not present

## 2020-03-27 DIAGNOSIS — M6281 Muscle weakness (generalized): Secondary | ICD-10-CM | POA: Diagnosis not present

## 2020-03-27 DIAGNOSIS — I69054 Hemiplegia and hemiparesis following nontraumatic subarachnoid hemorrhage affecting left non-dominant side: Secondary | ICD-10-CM | POA: Diagnosis not present

## 2020-03-28 DIAGNOSIS — I69054 Hemiplegia and hemiparesis following nontraumatic subarachnoid hemorrhage affecting left non-dominant side: Secondary | ICD-10-CM | POA: Diagnosis not present

## 2020-03-28 DIAGNOSIS — I69352 Hemiplegia and hemiparesis following cerebral infarction affecting left dominant side: Secondary | ICD-10-CM | POA: Diagnosis not present

## 2020-03-28 DIAGNOSIS — M6281 Muscle weakness (generalized): Secondary | ICD-10-CM | POA: Diagnosis not present

## 2020-03-28 DIAGNOSIS — M24571 Contracture, right ankle: Secondary | ICD-10-CM | POA: Diagnosis not present

## 2020-03-28 DIAGNOSIS — M6259 Muscle wasting and atrophy, not elsewhere classified, multiple sites: Secondary | ICD-10-CM | POA: Diagnosis not present

## 2020-03-28 DIAGNOSIS — G8111 Spastic hemiplegia affecting right dominant side: Secondary | ICD-10-CM | POA: Diagnosis not present

## 2020-03-29 DIAGNOSIS — G8111 Spastic hemiplegia affecting right dominant side: Secondary | ICD-10-CM | POA: Diagnosis not present

## 2020-03-29 DIAGNOSIS — I69352 Hemiplegia and hemiparesis following cerebral infarction affecting left dominant side: Secondary | ICD-10-CM | POA: Diagnosis not present

## 2020-03-29 DIAGNOSIS — M6281 Muscle weakness (generalized): Secondary | ICD-10-CM | POA: Diagnosis not present

## 2020-03-29 DIAGNOSIS — M6259 Muscle wasting and atrophy, not elsewhere classified, multiple sites: Secondary | ICD-10-CM | POA: Diagnosis not present

## 2020-03-29 DIAGNOSIS — M24571 Contracture, right ankle: Secondary | ICD-10-CM | POA: Diagnosis not present

## 2020-03-29 DIAGNOSIS — I69054 Hemiplegia and hemiparesis following nontraumatic subarachnoid hemorrhage affecting left non-dominant side: Secondary | ICD-10-CM | POA: Diagnosis not present

## 2020-03-30 DIAGNOSIS — M6281 Muscle weakness (generalized): Secondary | ICD-10-CM | POA: Diagnosis not present

## 2020-03-30 DIAGNOSIS — I69054 Hemiplegia and hemiparesis following nontraumatic subarachnoid hemorrhage affecting left non-dominant side: Secondary | ICD-10-CM | POA: Diagnosis not present

## 2020-03-30 DIAGNOSIS — I69352 Hemiplegia and hemiparesis following cerebral infarction affecting left dominant side: Secondary | ICD-10-CM | POA: Diagnosis not present

## 2020-03-30 DIAGNOSIS — G8111 Spastic hemiplegia affecting right dominant side: Secondary | ICD-10-CM | POA: Diagnosis not present

## 2020-03-30 DIAGNOSIS — M6259 Muscle wasting and atrophy, not elsewhere classified, multiple sites: Secondary | ICD-10-CM | POA: Diagnosis not present

## 2020-03-30 DIAGNOSIS — Z03818 Encounter for observation for suspected exposure to other biological agents ruled out: Secondary | ICD-10-CM | POA: Diagnosis not present

## 2020-03-30 DIAGNOSIS — M24571 Contracture, right ankle: Secondary | ICD-10-CM | POA: Diagnosis not present

## 2020-04-02 DIAGNOSIS — I69352 Hemiplegia and hemiparesis following cerebral infarction affecting left dominant side: Secondary | ICD-10-CM | POA: Diagnosis not present

## 2020-04-02 DIAGNOSIS — I69054 Hemiplegia and hemiparesis following nontraumatic subarachnoid hemorrhage affecting left non-dominant side: Secondary | ICD-10-CM | POA: Diagnosis not present

## 2020-04-02 DIAGNOSIS — M24571 Contracture, right ankle: Secondary | ICD-10-CM | POA: Diagnosis not present

## 2020-04-02 DIAGNOSIS — M6281 Muscle weakness (generalized): Secondary | ICD-10-CM | POA: Diagnosis not present

## 2020-04-02 DIAGNOSIS — G8111 Spastic hemiplegia affecting right dominant side: Secondary | ICD-10-CM | POA: Diagnosis not present

## 2020-04-02 DIAGNOSIS — M6259 Muscle wasting and atrophy, not elsewhere classified, multiple sites: Secondary | ICD-10-CM | POA: Diagnosis not present

## 2020-04-03 DIAGNOSIS — M6259 Muscle wasting and atrophy, not elsewhere classified, multiple sites: Secondary | ICD-10-CM | POA: Diagnosis not present

## 2020-04-03 DIAGNOSIS — M24571 Contracture, right ankle: Secondary | ICD-10-CM | POA: Diagnosis not present

## 2020-04-03 DIAGNOSIS — G8111 Spastic hemiplegia affecting right dominant side: Secondary | ICD-10-CM | POA: Diagnosis not present

## 2020-04-03 DIAGNOSIS — I69352 Hemiplegia and hemiparesis following cerebral infarction affecting left dominant side: Secondary | ICD-10-CM | POA: Diagnosis not present

## 2020-04-03 DIAGNOSIS — M6281 Muscle weakness (generalized): Secondary | ICD-10-CM | POA: Diagnosis not present

## 2020-04-03 DIAGNOSIS — I69054 Hemiplegia and hemiparesis following nontraumatic subarachnoid hemorrhage affecting left non-dominant side: Secondary | ICD-10-CM | POA: Diagnosis not present

## 2020-04-04 DIAGNOSIS — M6281 Muscle weakness (generalized): Secondary | ICD-10-CM | POA: Diagnosis not present

## 2020-04-04 DIAGNOSIS — I69054 Hemiplegia and hemiparesis following nontraumatic subarachnoid hemorrhage affecting left non-dominant side: Secondary | ICD-10-CM | POA: Diagnosis not present

## 2020-04-04 DIAGNOSIS — I69352 Hemiplegia and hemiparesis following cerebral infarction affecting left dominant side: Secondary | ICD-10-CM | POA: Diagnosis not present

## 2020-04-04 DIAGNOSIS — M6259 Muscle wasting and atrophy, not elsewhere classified, multiple sites: Secondary | ICD-10-CM | POA: Diagnosis not present

## 2020-04-04 DIAGNOSIS — G8111 Spastic hemiplegia affecting right dominant side: Secondary | ICD-10-CM | POA: Diagnosis not present

## 2020-04-04 DIAGNOSIS — M24571 Contracture, right ankle: Secondary | ICD-10-CM | POA: Diagnosis not present

## 2020-04-05 ENCOUNTER — Telehealth: Payer: Self-pay

## 2020-04-05 DIAGNOSIS — I69054 Hemiplegia and hemiparesis following nontraumatic subarachnoid hemorrhage affecting left non-dominant side: Secondary | ICD-10-CM | POA: Diagnosis not present

## 2020-04-05 DIAGNOSIS — I69352 Hemiplegia and hemiparesis following cerebral infarction affecting left dominant side: Secondary | ICD-10-CM | POA: Diagnosis not present

## 2020-04-05 DIAGNOSIS — M6281 Muscle weakness (generalized): Secondary | ICD-10-CM | POA: Diagnosis not present

## 2020-04-05 DIAGNOSIS — M24571 Contracture, right ankle: Secondary | ICD-10-CM | POA: Diagnosis not present

## 2020-04-05 DIAGNOSIS — M6259 Muscle wasting and atrophy, not elsewhere classified, multiple sites: Secondary | ICD-10-CM | POA: Diagnosis not present

## 2020-04-05 DIAGNOSIS — G8111 Spastic hemiplegia affecting right dominant side: Secondary | ICD-10-CM | POA: Diagnosis not present

## 2020-04-05 NOTE — Telephone Encounter (Signed)
Ms. Gravlin son called he's having a bit of an issue with the nursing home weaning her off her seizure medication, they're not taking his word for it,  the nursing home wants documentation.

## 2020-04-06 DIAGNOSIS — I69352 Hemiplegia and hemiparesis following cerebral infarction affecting left dominant side: Secondary | ICD-10-CM | POA: Diagnosis not present

## 2020-04-06 DIAGNOSIS — M24571 Contracture, right ankle: Secondary | ICD-10-CM | POA: Diagnosis not present

## 2020-04-06 DIAGNOSIS — M6281 Muscle weakness (generalized): Secondary | ICD-10-CM | POA: Diagnosis not present

## 2020-04-06 DIAGNOSIS — M6259 Muscle wasting and atrophy, not elsewhere classified, multiple sites: Secondary | ICD-10-CM | POA: Diagnosis not present

## 2020-04-06 DIAGNOSIS — I69054 Hemiplegia and hemiparesis following nontraumatic subarachnoid hemorrhage affecting left non-dominant side: Secondary | ICD-10-CM | POA: Diagnosis not present

## 2020-04-06 DIAGNOSIS — G8111 Spastic hemiplegia affecting right dominant side: Secondary | ICD-10-CM | POA: Diagnosis not present

## 2020-04-06 NOTE — Telephone Encounter (Signed)
I wrote a prescription on my desk that can be faxed to SNF regarding weaning off [place].

## 2020-04-06 NOTE — Telephone Encounter (Signed)
Note was faxed to [facility] to the Director Domingo Sep.

## 2020-04-09 DIAGNOSIS — M6259 Muscle wasting and atrophy, not elsewhere classified, multiple sites: Secondary | ICD-10-CM | POA: Diagnosis not present

## 2020-04-09 DIAGNOSIS — I69054 Hemiplegia and hemiparesis following nontraumatic subarachnoid hemorrhage affecting left non-dominant side: Secondary | ICD-10-CM | POA: Diagnosis not present

## 2020-04-09 DIAGNOSIS — M24571 Contracture, right ankle: Secondary | ICD-10-CM | POA: Diagnosis not present

## 2020-04-09 DIAGNOSIS — I69352 Hemiplegia and hemiparesis following cerebral infarction affecting left dominant side: Secondary | ICD-10-CM | POA: Diagnosis not present

## 2020-04-09 DIAGNOSIS — M6281 Muscle weakness (generalized): Secondary | ICD-10-CM | POA: Diagnosis not present

## 2020-04-09 DIAGNOSIS — G8111 Spastic hemiplegia affecting right dominant side: Secondary | ICD-10-CM | POA: Diagnosis not present

## 2020-04-10 DIAGNOSIS — M24571 Contracture, right ankle: Secondary | ICD-10-CM | POA: Diagnosis not present

## 2020-04-10 DIAGNOSIS — G8111 Spastic hemiplegia affecting right dominant side: Secondary | ICD-10-CM | POA: Diagnosis not present

## 2020-04-10 DIAGNOSIS — M6259 Muscle wasting and atrophy, not elsewhere classified, multiple sites: Secondary | ICD-10-CM | POA: Diagnosis not present

## 2020-04-10 DIAGNOSIS — I69054 Hemiplegia and hemiparesis following nontraumatic subarachnoid hemorrhage affecting left non-dominant side: Secondary | ICD-10-CM | POA: Diagnosis not present

## 2020-04-10 DIAGNOSIS — M6281 Muscle weakness (generalized): Secondary | ICD-10-CM | POA: Diagnosis not present

## 2020-04-10 DIAGNOSIS — I69352 Hemiplegia and hemiparesis following cerebral infarction affecting left dominant side: Secondary | ICD-10-CM | POA: Diagnosis not present

## 2020-04-11 ENCOUNTER — Encounter: Payer: Medicare Other | Attending: Physical Medicine & Rehabilitation | Admitting: Physical Medicine & Rehabilitation

## 2020-04-11 DIAGNOSIS — G8114 Spastic hemiplegia affecting left nondominant side: Secondary | ICD-10-CM | POA: Insufficient documentation

## 2020-04-11 DIAGNOSIS — I69054 Hemiplegia and hemiparesis following nontraumatic subarachnoid hemorrhage affecting left non-dominant side: Secondary | ICD-10-CM | POA: Diagnosis not present

## 2020-04-11 DIAGNOSIS — M6259 Muscle wasting and atrophy, not elsewhere classified, multiple sites: Secondary | ICD-10-CM | POA: Diagnosis not present

## 2020-04-11 DIAGNOSIS — G8111 Spastic hemiplegia affecting right dominant side: Secondary | ICD-10-CM | POA: Diagnosis not present

## 2020-04-11 DIAGNOSIS — I609 Nontraumatic subarachnoid hemorrhage, unspecified: Secondary | ICD-10-CM | POA: Insufficient documentation

## 2020-04-11 DIAGNOSIS — R278 Other lack of coordination: Secondary | ICD-10-CM | POA: Diagnosis not present

## 2020-04-11 DIAGNOSIS — M24571 Contracture, right ankle: Secondary | ICD-10-CM | POA: Diagnosis not present

## 2020-04-11 DIAGNOSIS — M24572 Contracture, left ankle: Secondary | ICD-10-CM | POA: Diagnosis not present

## 2020-04-11 DIAGNOSIS — I69352 Hemiplegia and hemiparesis following cerebral infarction affecting left dominant side: Secondary | ICD-10-CM | POA: Diagnosis not present

## 2020-04-11 DIAGNOSIS — M6281 Muscle weakness (generalized): Secondary | ICD-10-CM | POA: Diagnosis not present

## 2020-04-12 DIAGNOSIS — M6259 Muscle wasting and atrophy, not elsewhere classified, multiple sites: Secondary | ICD-10-CM | POA: Diagnosis not present

## 2020-04-12 DIAGNOSIS — I69054 Hemiplegia and hemiparesis following nontraumatic subarachnoid hemorrhage affecting left non-dominant side: Secondary | ICD-10-CM | POA: Diagnosis not present

## 2020-04-12 DIAGNOSIS — M24571 Contracture, right ankle: Secondary | ICD-10-CM | POA: Diagnosis not present

## 2020-04-12 DIAGNOSIS — G8111 Spastic hemiplegia affecting right dominant side: Secondary | ICD-10-CM | POA: Diagnosis not present

## 2020-04-12 DIAGNOSIS — M6281 Muscle weakness (generalized): Secondary | ICD-10-CM | POA: Diagnosis not present

## 2020-04-12 DIAGNOSIS — I69352 Hemiplegia and hemiparesis following cerebral infarction affecting left dominant side: Secondary | ICD-10-CM | POA: Diagnosis not present

## 2020-04-13 DIAGNOSIS — Z03818 Encounter for observation for suspected exposure to other biological agents ruled out: Secondary | ICD-10-CM | POA: Diagnosis not present

## 2020-04-14 DIAGNOSIS — M24571 Contracture, right ankle: Secondary | ICD-10-CM | POA: Diagnosis not present

## 2020-04-14 DIAGNOSIS — M6281 Muscle weakness (generalized): Secondary | ICD-10-CM | POA: Diagnosis not present

## 2020-04-14 DIAGNOSIS — I69054 Hemiplegia and hemiparesis following nontraumatic subarachnoid hemorrhage affecting left non-dominant side: Secondary | ICD-10-CM | POA: Diagnosis not present

## 2020-04-14 DIAGNOSIS — M6259 Muscle wasting and atrophy, not elsewhere classified, multiple sites: Secondary | ICD-10-CM | POA: Diagnosis not present

## 2020-04-14 DIAGNOSIS — I69352 Hemiplegia and hemiparesis following cerebral infarction affecting left dominant side: Secondary | ICD-10-CM | POA: Diagnosis not present

## 2020-04-14 DIAGNOSIS — G8111 Spastic hemiplegia affecting right dominant side: Secondary | ICD-10-CM | POA: Diagnosis not present

## 2020-04-16 DIAGNOSIS — M24571 Contracture, right ankle: Secondary | ICD-10-CM | POA: Diagnosis not present

## 2020-04-16 DIAGNOSIS — M6281 Muscle weakness (generalized): Secondary | ICD-10-CM | POA: Diagnosis not present

## 2020-04-16 DIAGNOSIS — I69352 Hemiplegia and hemiparesis following cerebral infarction affecting left dominant side: Secondary | ICD-10-CM | POA: Diagnosis not present

## 2020-04-16 DIAGNOSIS — G8111 Spastic hemiplegia affecting right dominant side: Secondary | ICD-10-CM | POA: Diagnosis not present

## 2020-04-16 DIAGNOSIS — M6259 Muscle wasting and atrophy, not elsewhere classified, multiple sites: Secondary | ICD-10-CM | POA: Diagnosis not present

## 2020-04-16 DIAGNOSIS — I69054 Hemiplegia and hemiparesis following nontraumatic subarachnoid hemorrhage affecting left non-dominant side: Secondary | ICD-10-CM | POA: Diagnosis not present

## 2020-04-17 DIAGNOSIS — M24571 Contracture, right ankle: Secondary | ICD-10-CM | POA: Diagnosis not present

## 2020-04-17 DIAGNOSIS — M6259 Muscle wasting and atrophy, not elsewhere classified, multiple sites: Secondary | ICD-10-CM | POA: Diagnosis not present

## 2020-04-17 DIAGNOSIS — I69054 Hemiplegia and hemiparesis following nontraumatic subarachnoid hemorrhage affecting left non-dominant side: Secondary | ICD-10-CM | POA: Diagnosis not present

## 2020-04-17 DIAGNOSIS — M6281 Muscle weakness (generalized): Secondary | ICD-10-CM | POA: Diagnosis not present

## 2020-04-17 DIAGNOSIS — I69352 Hemiplegia and hemiparesis following cerebral infarction affecting left dominant side: Secondary | ICD-10-CM | POA: Diagnosis not present

## 2020-04-17 DIAGNOSIS — G8111 Spastic hemiplegia affecting right dominant side: Secondary | ICD-10-CM | POA: Diagnosis not present

## 2020-04-18 DIAGNOSIS — I69054 Hemiplegia and hemiparesis following nontraumatic subarachnoid hemorrhage affecting left non-dominant side: Secondary | ICD-10-CM | POA: Diagnosis not present

## 2020-04-18 DIAGNOSIS — G8111 Spastic hemiplegia affecting right dominant side: Secondary | ICD-10-CM | POA: Diagnosis not present

## 2020-04-18 DIAGNOSIS — I69352 Hemiplegia and hemiparesis following cerebral infarction affecting left dominant side: Secondary | ICD-10-CM | POA: Diagnosis not present

## 2020-04-18 DIAGNOSIS — M6259 Muscle wasting and atrophy, not elsewhere classified, multiple sites: Secondary | ICD-10-CM | POA: Diagnosis not present

## 2020-04-18 DIAGNOSIS — M6281 Muscle weakness (generalized): Secondary | ICD-10-CM | POA: Diagnosis not present

## 2020-04-18 DIAGNOSIS — M24571 Contracture, right ankle: Secondary | ICD-10-CM | POA: Diagnosis not present

## 2020-04-19 DIAGNOSIS — M24571 Contracture, right ankle: Secondary | ICD-10-CM | POA: Diagnosis not present

## 2020-04-19 DIAGNOSIS — I69352 Hemiplegia and hemiparesis following cerebral infarction affecting left dominant side: Secondary | ICD-10-CM | POA: Diagnosis not present

## 2020-04-19 DIAGNOSIS — M6259 Muscle wasting and atrophy, not elsewhere classified, multiple sites: Secondary | ICD-10-CM | POA: Diagnosis not present

## 2020-04-19 DIAGNOSIS — G8111 Spastic hemiplegia affecting right dominant side: Secondary | ICD-10-CM | POA: Diagnosis not present

## 2020-04-19 DIAGNOSIS — M6281 Muscle weakness (generalized): Secondary | ICD-10-CM | POA: Diagnosis not present

## 2020-04-19 DIAGNOSIS — I69054 Hemiplegia and hemiparesis following nontraumatic subarachnoid hemorrhage affecting left non-dominant side: Secondary | ICD-10-CM | POA: Diagnosis not present

## 2020-04-19 NOTE — Telephone Encounter (Signed)
Error

## 2020-04-20 DIAGNOSIS — M24571 Contracture, right ankle: Secondary | ICD-10-CM | POA: Diagnosis not present

## 2020-04-20 DIAGNOSIS — G8111 Spastic hemiplegia affecting right dominant side: Secondary | ICD-10-CM | POA: Diagnosis not present

## 2020-04-20 DIAGNOSIS — M6281 Muscle weakness (generalized): Secondary | ICD-10-CM | POA: Diagnosis not present

## 2020-04-20 DIAGNOSIS — M6259 Muscle wasting and atrophy, not elsewhere classified, multiple sites: Secondary | ICD-10-CM | POA: Diagnosis not present

## 2020-04-20 DIAGNOSIS — I69054 Hemiplegia and hemiparesis following nontraumatic subarachnoid hemorrhage affecting left non-dominant side: Secondary | ICD-10-CM | POA: Diagnosis not present

## 2020-04-20 DIAGNOSIS — Z20822 Contact with and (suspected) exposure to covid-19: Secondary | ICD-10-CM | POA: Diagnosis not present

## 2020-04-20 DIAGNOSIS — I69352 Hemiplegia and hemiparesis following cerebral infarction affecting left dominant side: Secondary | ICD-10-CM | POA: Diagnosis not present

## 2020-04-23 DIAGNOSIS — M24571 Contracture, right ankle: Secondary | ICD-10-CM | POA: Diagnosis not present

## 2020-04-23 DIAGNOSIS — M6259 Muscle wasting and atrophy, not elsewhere classified, multiple sites: Secondary | ICD-10-CM | POA: Diagnosis not present

## 2020-04-23 DIAGNOSIS — G8111 Spastic hemiplegia affecting right dominant side: Secondary | ICD-10-CM | POA: Diagnosis not present

## 2020-04-23 DIAGNOSIS — I69054 Hemiplegia and hemiparesis following nontraumatic subarachnoid hemorrhage affecting left non-dominant side: Secondary | ICD-10-CM | POA: Diagnosis not present

## 2020-04-23 DIAGNOSIS — I69352 Hemiplegia and hemiparesis following cerebral infarction affecting left dominant side: Secondary | ICD-10-CM | POA: Diagnosis not present

## 2020-04-23 DIAGNOSIS — M6281 Muscle weakness (generalized): Secondary | ICD-10-CM | POA: Diagnosis not present

## 2020-04-24 DIAGNOSIS — M24571 Contracture, right ankle: Secondary | ICD-10-CM | POA: Diagnosis not present

## 2020-04-24 DIAGNOSIS — I69054 Hemiplegia and hemiparesis following nontraumatic subarachnoid hemorrhage affecting left non-dominant side: Secondary | ICD-10-CM | POA: Diagnosis not present

## 2020-04-24 DIAGNOSIS — M6281 Muscle weakness (generalized): Secondary | ICD-10-CM | POA: Diagnosis not present

## 2020-04-24 DIAGNOSIS — M6259 Muscle wasting and atrophy, not elsewhere classified, multiple sites: Secondary | ICD-10-CM | POA: Diagnosis not present

## 2020-04-24 DIAGNOSIS — I69352 Hemiplegia and hemiparesis following cerebral infarction affecting left dominant side: Secondary | ICD-10-CM | POA: Diagnosis not present

## 2020-04-24 DIAGNOSIS — G8111 Spastic hemiplegia affecting right dominant side: Secondary | ICD-10-CM | POA: Diagnosis not present

## 2020-04-26 DIAGNOSIS — M24571 Contracture, right ankle: Secondary | ICD-10-CM | POA: Diagnosis not present

## 2020-04-26 DIAGNOSIS — I69352 Hemiplegia and hemiparesis following cerebral infarction affecting left dominant side: Secondary | ICD-10-CM | POA: Diagnosis not present

## 2020-04-26 DIAGNOSIS — G8111 Spastic hemiplegia affecting right dominant side: Secondary | ICD-10-CM | POA: Diagnosis not present

## 2020-04-26 DIAGNOSIS — M6281 Muscle weakness (generalized): Secondary | ICD-10-CM | POA: Diagnosis not present

## 2020-04-26 DIAGNOSIS — M6259 Muscle wasting and atrophy, not elsewhere classified, multiple sites: Secondary | ICD-10-CM | POA: Diagnosis not present

## 2020-04-26 DIAGNOSIS — I69054 Hemiplegia and hemiparesis following nontraumatic subarachnoid hemorrhage affecting left non-dominant side: Secondary | ICD-10-CM | POA: Diagnosis not present

## 2020-04-27 DIAGNOSIS — I69054 Hemiplegia and hemiparesis following nontraumatic subarachnoid hemorrhage affecting left non-dominant side: Secondary | ICD-10-CM | POA: Diagnosis not present

## 2020-04-27 DIAGNOSIS — M24571 Contracture, right ankle: Secondary | ICD-10-CM | POA: Diagnosis not present

## 2020-04-27 DIAGNOSIS — M6259 Muscle wasting and atrophy, not elsewhere classified, multiple sites: Secondary | ICD-10-CM | POA: Diagnosis not present

## 2020-04-27 DIAGNOSIS — I69352 Hemiplegia and hemiparesis following cerebral infarction affecting left dominant side: Secondary | ICD-10-CM | POA: Diagnosis not present

## 2020-04-27 DIAGNOSIS — G8111 Spastic hemiplegia affecting right dominant side: Secondary | ICD-10-CM | POA: Diagnosis not present

## 2020-04-27 DIAGNOSIS — Z03818 Encounter for observation for suspected exposure to other biological agents ruled out: Secondary | ICD-10-CM | POA: Diagnosis not present

## 2020-04-27 DIAGNOSIS — M6281 Muscle weakness (generalized): Secondary | ICD-10-CM | POA: Diagnosis not present

## 2020-04-28 DIAGNOSIS — M24571 Contracture, right ankle: Secondary | ICD-10-CM | POA: Diagnosis not present

## 2020-04-28 DIAGNOSIS — M6281 Muscle weakness (generalized): Secondary | ICD-10-CM | POA: Diagnosis not present

## 2020-04-28 DIAGNOSIS — G8111 Spastic hemiplegia affecting right dominant side: Secondary | ICD-10-CM | POA: Diagnosis not present

## 2020-04-28 DIAGNOSIS — M6259 Muscle wasting and atrophy, not elsewhere classified, multiple sites: Secondary | ICD-10-CM | POA: Diagnosis not present

## 2020-04-28 DIAGNOSIS — I69054 Hemiplegia and hemiparesis following nontraumatic subarachnoid hemorrhage affecting left non-dominant side: Secondary | ICD-10-CM | POA: Diagnosis not present

## 2020-04-28 DIAGNOSIS — I69352 Hemiplegia and hemiparesis following cerebral infarction affecting left dominant side: Secondary | ICD-10-CM | POA: Diagnosis not present

## 2020-04-30 DIAGNOSIS — G8111 Spastic hemiplegia affecting right dominant side: Secondary | ICD-10-CM | POA: Diagnosis not present

## 2020-04-30 DIAGNOSIS — M24571 Contracture, right ankle: Secondary | ICD-10-CM | POA: Diagnosis not present

## 2020-04-30 DIAGNOSIS — M6281 Muscle weakness (generalized): Secondary | ICD-10-CM | POA: Diagnosis not present

## 2020-04-30 DIAGNOSIS — I69054 Hemiplegia and hemiparesis following nontraumatic subarachnoid hemorrhage affecting left non-dominant side: Secondary | ICD-10-CM | POA: Diagnosis not present

## 2020-04-30 DIAGNOSIS — M6259 Muscle wasting and atrophy, not elsewhere classified, multiple sites: Secondary | ICD-10-CM | POA: Diagnosis not present

## 2020-04-30 DIAGNOSIS — I69352 Hemiplegia and hemiparesis following cerebral infarction affecting left dominant side: Secondary | ICD-10-CM | POA: Diagnosis not present

## 2020-05-01 DIAGNOSIS — M6259 Muscle wasting and atrophy, not elsewhere classified, multiple sites: Secondary | ICD-10-CM | POA: Diagnosis not present

## 2020-05-01 DIAGNOSIS — M24571 Contracture, right ankle: Secondary | ICD-10-CM | POA: Diagnosis not present

## 2020-05-01 DIAGNOSIS — I69054 Hemiplegia and hemiparesis following nontraumatic subarachnoid hemorrhage affecting left non-dominant side: Secondary | ICD-10-CM | POA: Diagnosis not present

## 2020-05-01 DIAGNOSIS — M6281 Muscle weakness (generalized): Secondary | ICD-10-CM | POA: Diagnosis not present

## 2020-05-01 DIAGNOSIS — G8111 Spastic hemiplegia affecting right dominant side: Secondary | ICD-10-CM | POA: Diagnosis not present

## 2020-05-01 DIAGNOSIS — I69352 Hemiplegia and hemiparesis following cerebral infarction affecting left dominant side: Secondary | ICD-10-CM | POA: Diagnosis not present

## 2020-05-02 DIAGNOSIS — I69054 Hemiplegia and hemiparesis following nontraumatic subarachnoid hemorrhage affecting left non-dominant side: Secondary | ICD-10-CM | POA: Diagnosis not present

## 2020-05-02 DIAGNOSIS — M24571 Contracture, right ankle: Secondary | ICD-10-CM | POA: Diagnosis not present

## 2020-05-02 DIAGNOSIS — I69352 Hemiplegia and hemiparesis following cerebral infarction affecting left dominant side: Secondary | ICD-10-CM | POA: Diagnosis not present

## 2020-05-02 DIAGNOSIS — M6259 Muscle wasting and atrophy, not elsewhere classified, multiple sites: Secondary | ICD-10-CM | POA: Diagnosis not present

## 2020-05-02 DIAGNOSIS — G8111 Spastic hemiplegia affecting right dominant side: Secondary | ICD-10-CM | POA: Diagnosis not present

## 2020-05-02 DIAGNOSIS — M6281 Muscle weakness (generalized): Secondary | ICD-10-CM | POA: Diagnosis not present

## 2020-05-03 DIAGNOSIS — M24571 Contracture, right ankle: Secondary | ICD-10-CM | POA: Diagnosis not present

## 2020-05-03 DIAGNOSIS — I69054 Hemiplegia and hemiparesis following nontraumatic subarachnoid hemorrhage affecting left non-dominant side: Secondary | ICD-10-CM | POA: Diagnosis not present

## 2020-05-03 DIAGNOSIS — I69352 Hemiplegia and hemiparesis following cerebral infarction affecting left dominant side: Secondary | ICD-10-CM | POA: Diagnosis not present

## 2020-05-03 DIAGNOSIS — G8111 Spastic hemiplegia affecting right dominant side: Secondary | ICD-10-CM | POA: Diagnosis not present

## 2020-05-03 DIAGNOSIS — M6259 Muscle wasting and atrophy, not elsewhere classified, multiple sites: Secondary | ICD-10-CM | POA: Diagnosis not present

## 2020-05-03 DIAGNOSIS — M6281 Muscle weakness (generalized): Secondary | ICD-10-CM | POA: Diagnosis not present

## 2020-05-04 DIAGNOSIS — M6281 Muscle weakness (generalized): Secondary | ICD-10-CM | POA: Diagnosis not present

## 2020-05-04 DIAGNOSIS — I69054 Hemiplegia and hemiparesis following nontraumatic subarachnoid hemorrhage affecting left non-dominant side: Secondary | ICD-10-CM | POA: Diagnosis not present

## 2020-05-04 DIAGNOSIS — I69352 Hemiplegia and hemiparesis following cerebral infarction affecting left dominant side: Secondary | ICD-10-CM | POA: Diagnosis not present

## 2020-05-04 DIAGNOSIS — M6259 Muscle wasting and atrophy, not elsewhere classified, multiple sites: Secondary | ICD-10-CM | POA: Diagnosis not present

## 2020-05-04 DIAGNOSIS — I69391 Dysphagia following cerebral infarction: Secondary | ICD-10-CM | POA: Diagnosis not present

## 2020-05-04 DIAGNOSIS — M24571 Contracture, right ankle: Secondary | ICD-10-CM | POA: Diagnosis not present

## 2020-05-04 DIAGNOSIS — I69314 Frontal lobe and executive function deficit following cerebral infarction: Secondary | ICD-10-CM | POA: Diagnosis not present

## 2020-05-04 DIAGNOSIS — G8111 Spastic hemiplegia affecting right dominant side: Secondary | ICD-10-CM | POA: Diagnosis not present

## 2020-05-07 ENCOUNTER — Telehealth: Payer: Self-pay

## 2020-05-07 DIAGNOSIS — M6259 Muscle wasting and atrophy, not elsewhere classified, multiple sites: Secondary | ICD-10-CM | POA: Diagnosis not present

## 2020-05-07 DIAGNOSIS — I69352 Hemiplegia and hemiparesis following cerebral infarction affecting left dominant side: Secondary | ICD-10-CM | POA: Diagnosis not present

## 2020-05-07 DIAGNOSIS — G4700 Insomnia, unspecified: Secondary | ICD-10-CM | POA: Diagnosis not present

## 2020-05-07 DIAGNOSIS — M6281 Muscle weakness (generalized): Secondary | ICD-10-CM | POA: Diagnosis not present

## 2020-05-07 DIAGNOSIS — M24571 Contracture, right ankle: Secondary | ICD-10-CM | POA: Diagnosis not present

## 2020-05-07 DIAGNOSIS — G8111 Spastic hemiplegia affecting right dominant side: Secondary | ICD-10-CM | POA: Diagnosis not present

## 2020-05-07 DIAGNOSIS — F338 Other recurrent depressive disorders: Secondary | ICD-10-CM | POA: Diagnosis not present

## 2020-05-07 DIAGNOSIS — Z13220 Encounter for screening for lipoid disorders: Secondary | ICD-10-CM | POA: Diagnosis not present

## 2020-05-07 DIAGNOSIS — I69054 Hemiplegia and hemiparesis following nontraumatic subarachnoid hemorrhage affecting left non-dominant side: Secondary | ICD-10-CM | POA: Diagnosis not present

## 2020-05-07 DIAGNOSIS — R970 Elevated carcinoembryonic antigen [CEA]: Secondary | ICD-10-CM | POA: Diagnosis not present

## 2020-05-07 DIAGNOSIS — R7989 Other specified abnormal findings of blood chemistry: Secondary | ICD-10-CM | POA: Diagnosis not present

## 2020-05-07 NOTE — Telephone Encounter (Signed)
[name] [name] would like to speak/consultation with provider about some things that the nursing home is giving him a hassle about.

## 2020-05-08 ENCOUNTER — Encounter: Payer: Self-pay | Admitting: Family Medicine

## 2020-05-09 DIAGNOSIS — I69352 Hemiplegia and hemiparesis following cerebral infarction affecting left dominant side: Secondary | ICD-10-CM | POA: Diagnosis not present

## 2020-05-09 DIAGNOSIS — M6259 Muscle wasting and atrophy, not elsewhere classified, multiple sites: Secondary | ICD-10-CM | POA: Diagnosis not present

## 2020-05-09 DIAGNOSIS — M24571 Contracture, right ankle: Secondary | ICD-10-CM | POA: Diagnosis not present

## 2020-05-09 DIAGNOSIS — M6281 Muscle weakness (generalized): Secondary | ICD-10-CM | POA: Diagnosis not present

## 2020-05-09 DIAGNOSIS — G8111 Spastic hemiplegia affecting right dominant side: Secondary | ICD-10-CM | POA: Diagnosis not present

## 2020-05-09 DIAGNOSIS — I69054 Hemiplegia and hemiparesis following nontraumatic subarachnoid hemorrhage affecting left non-dominant side: Secondary | ICD-10-CM | POA: Diagnosis not present

## 2020-05-10 DIAGNOSIS — M6281 Muscle weakness (generalized): Secondary | ICD-10-CM | POA: Diagnosis not present

## 2020-05-10 DIAGNOSIS — M24571 Contracture, right ankle: Secondary | ICD-10-CM | POA: Diagnosis not present

## 2020-05-10 DIAGNOSIS — I69352 Hemiplegia and hemiparesis following cerebral infarction affecting left dominant side: Secondary | ICD-10-CM | POA: Diagnosis not present

## 2020-05-10 DIAGNOSIS — M6259 Muscle wasting and atrophy, not elsewhere classified, multiple sites: Secondary | ICD-10-CM | POA: Diagnosis not present

## 2020-05-10 DIAGNOSIS — I69054 Hemiplegia and hemiparesis following nontraumatic subarachnoid hemorrhage affecting left non-dominant side: Secondary | ICD-10-CM | POA: Diagnosis not present

## 2020-05-10 DIAGNOSIS — G8111 Spastic hemiplegia affecting right dominant side: Secondary | ICD-10-CM | POA: Diagnosis not present

## 2020-05-11 DIAGNOSIS — M24571 Contracture, right ankle: Secondary | ICD-10-CM | POA: Diagnosis not present

## 2020-05-11 DIAGNOSIS — I69054 Hemiplegia and hemiparesis following nontraumatic subarachnoid hemorrhage affecting left non-dominant side: Secondary | ICD-10-CM | POA: Diagnosis not present

## 2020-05-11 DIAGNOSIS — R278 Other lack of coordination: Secondary | ICD-10-CM | POA: Diagnosis not present

## 2020-05-11 DIAGNOSIS — G8111 Spastic hemiplegia affecting right dominant side: Secondary | ICD-10-CM | POA: Diagnosis not present

## 2020-05-11 DIAGNOSIS — M6281 Muscle weakness (generalized): Secondary | ICD-10-CM | POA: Diagnosis not present

## 2020-05-11 DIAGNOSIS — Z03818 Encounter for observation for suspected exposure to other biological agents ruled out: Secondary | ICD-10-CM | POA: Diagnosis not present

## 2020-05-11 DIAGNOSIS — M6259 Muscle wasting and atrophy, not elsewhere classified, multiple sites: Secondary | ICD-10-CM | POA: Diagnosis not present

## 2020-05-11 DIAGNOSIS — I69391 Dysphagia following cerebral infarction: Secondary | ICD-10-CM | POA: Diagnosis not present

## 2020-05-11 DIAGNOSIS — I69352 Hemiplegia and hemiparesis following cerebral infarction affecting left dominant side: Secondary | ICD-10-CM | POA: Diagnosis not present

## 2020-05-11 DIAGNOSIS — M24572 Contracture, left ankle: Secondary | ICD-10-CM | POA: Diagnosis not present

## 2020-05-11 DIAGNOSIS — R4701 Aphasia: Secondary | ICD-10-CM | POA: Diagnosis not present

## 2020-05-13 DIAGNOSIS — G8111 Spastic hemiplegia affecting right dominant side: Secondary | ICD-10-CM | POA: Diagnosis not present

## 2020-05-13 DIAGNOSIS — M6281 Muscle weakness (generalized): Secondary | ICD-10-CM | POA: Diagnosis not present

## 2020-05-13 DIAGNOSIS — I69352 Hemiplegia and hemiparesis following cerebral infarction affecting left dominant side: Secondary | ICD-10-CM | POA: Diagnosis not present

## 2020-05-13 DIAGNOSIS — M24571 Contracture, right ankle: Secondary | ICD-10-CM | POA: Diagnosis not present

## 2020-05-13 DIAGNOSIS — I69054 Hemiplegia and hemiparesis following nontraumatic subarachnoid hemorrhage affecting left non-dominant side: Secondary | ICD-10-CM | POA: Diagnosis not present

## 2020-05-13 DIAGNOSIS — M6259 Muscle wasting and atrophy, not elsewhere classified, multiple sites: Secondary | ICD-10-CM | POA: Diagnosis not present

## 2020-05-14 DIAGNOSIS — G8111 Spastic hemiplegia affecting right dominant side: Secondary | ICD-10-CM | POA: Diagnosis not present

## 2020-05-14 DIAGNOSIS — M6281 Muscle weakness (generalized): Secondary | ICD-10-CM | POA: Diagnosis not present

## 2020-05-14 DIAGNOSIS — M24571 Contracture, right ankle: Secondary | ICD-10-CM | POA: Diagnosis not present

## 2020-05-14 DIAGNOSIS — M6259 Muscle wasting and atrophy, not elsewhere classified, multiple sites: Secondary | ICD-10-CM | POA: Diagnosis not present

## 2020-05-14 DIAGNOSIS — I69352 Hemiplegia and hemiparesis following cerebral infarction affecting left dominant side: Secondary | ICD-10-CM | POA: Diagnosis not present

## 2020-05-14 DIAGNOSIS — I69054 Hemiplegia and hemiparesis following nontraumatic subarachnoid hemorrhage affecting left non-dominant side: Secondary | ICD-10-CM | POA: Diagnosis not present

## 2020-05-25 DIAGNOSIS — Z20822 Contact with and (suspected) exposure to covid-19: Secondary | ICD-10-CM | POA: Diagnosis not present

## 2020-05-29 ENCOUNTER — Telehealth: Payer: Self-pay | Admitting: Family Medicine

## 2020-05-29 NOTE — Telephone Encounter (Signed)
MD please advise

## 2020-05-29 NOTE — Telephone Encounter (Signed)
John call check if the Composite Form  that he drop off last week ready to pick up??

## 2020-05-30 DIAGNOSIS — M24571 Contracture, right ankle: Secondary | ICD-10-CM | POA: Diagnosis not present

## 2020-05-30 DIAGNOSIS — M6259 Muscle wasting and atrophy, not elsewhere classified, multiple sites: Secondary | ICD-10-CM | POA: Diagnosis not present

## 2020-05-30 DIAGNOSIS — I69352 Hemiplegia and hemiparesis following cerebral infarction affecting left dominant side: Secondary | ICD-10-CM | POA: Diagnosis not present

## 2020-05-30 DIAGNOSIS — M6281 Muscle weakness (generalized): Secondary | ICD-10-CM | POA: Diagnosis not present

## 2020-05-30 DIAGNOSIS — G8111 Spastic hemiplegia affecting right dominant side: Secondary | ICD-10-CM | POA: Diagnosis not present

## 2020-05-30 DIAGNOSIS — I69054 Hemiplegia and hemiparesis following nontraumatic subarachnoid hemorrhage affecting left non-dominant side: Secondary | ICD-10-CM | POA: Diagnosis not present

## 2020-05-31 DIAGNOSIS — I69352 Hemiplegia and hemiparesis following cerebral infarction affecting left dominant side: Secondary | ICD-10-CM | POA: Diagnosis not present

## 2020-05-31 DIAGNOSIS — M6281 Muscle weakness (generalized): Secondary | ICD-10-CM | POA: Diagnosis not present

## 2020-05-31 DIAGNOSIS — I69054 Hemiplegia and hemiparesis following nontraumatic subarachnoid hemorrhage affecting left non-dominant side: Secondary | ICD-10-CM | POA: Diagnosis not present

## 2020-05-31 DIAGNOSIS — M6259 Muscle wasting and atrophy, not elsewhere classified, multiple sites: Secondary | ICD-10-CM | POA: Diagnosis not present

## 2020-05-31 DIAGNOSIS — M24571 Contracture, right ankle: Secondary | ICD-10-CM | POA: Diagnosis not present

## 2020-05-31 DIAGNOSIS — G8111 Spastic hemiplegia affecting right dominant side: Secondary | ICD-10-CM | POA: Diagnosis not present

## 2020-06-01 ENCOUNTER — Telehealth: Payer: Self-pay | Admitting: Family Medicine

## 2020-06-01 DIAGNOSIS — I69352 Hemiplegia and hemiparesis following cerebral infarction affecting left dominant side: Secondary | ICD-10-CM | POA: Diagnosis not present

## 2020-06-01 DIAGNOSIS — M6281 Muscle weakness (generalized): Secondary | ICD-10-CM | POA: Diagnosis not present

## 2020-06-01 DIAGNOSIS — G8111 Spastic hemiplegia affecting right dominant side: Secondary | ICD-10-CM | POA: Diagnosis not present

## 2020-06-01 DIAGNOSIS — I69054 Hemiplegia and hemiparesis following nontraumatic subarachnoid hemorrhage affecting left non-dominant side: Secondary | ICD-10-CM | POA: Diagnosis not present

## 2020-06-01 DIAGNOSIS — M24571 Contracture, right ankle: Secondary | ICD-10-CM | POA: Diagnosis not present

## 2020-06-01 DIAGNOSIS — M6259 Muscle wasting and atrophy, not elsewhere classified, multiple sites: Secondary | ICD-10-CM | POA: Diagnosis not present

## 2020-06-01 NOTE — Telephone Encounter (Signed)
Son call his mother need a order fax to [facility] to change her diet from pureed to solid food

## 2020-06-01 NOTE — Telephone Encounter (Signed)
The son of [name] [name] stopped by office on 05-31-2020 to have letter signed by Provider.

## 2020-06-04 DIAGNOSIS — I69054 Hemiplegia and hemiparesis following nontraumatic subarachnoid hemorrhage affecting left non-dominant side: Secondary | ICD-10-CM | POA: Diagnosis not present

## 2020-06-04 DIAGNOSIS — G8111 Spastic hemiplegia affecting right dominant side: Secondary | ICD-10-CM | POA: Diagnosis not present

## 2020-06-04 DIAGNOSIS — M6259 Muscle wasting and atrophy, not elsewhere classified, multiple sites: Secondary | ICD-10-CM | POA: Diagnosis not present

## 2020-06-04 DIAGNOSIS — I69352 Hemiplegia and hemiparesis following cerebral infarction affecting left dominant side: Secondary | ICD-10-CM | POA: Diagnosis not present

## 2020-06-04 DIAGNOSIS — M6281 Muscle weakness (generalized): Secondary | ICD-10-CM | POA: Diagnosis not present

## 2020-06-04 DIAGNOSIS — M24571 Contracture, right ankle: Secondary | ICD-10-CM | POA: Diagnosis not present

## 2020-06-05 DIAGNOSIS — M6281 Muscle weakness (generalized): Secondary | ICD-10-CM | POA: Diagnosis not present

## 2020-06-05 DIAGNOSIS — M24571 Contracture, right ankle: Secondary | ICD-10-CM | POA: Diagnosis not present

## 2020-06-05 DIAGNOSIS — G8111 Spastic hemiplegia affecting right dominant side: Secondary | ICD-10-CM | POA: Diagnosis not present

## 2020-06-05 DIAGNOSIS — I69054 Hemiplegia and hemiparesis following nontraumatic subarachnoid hemorrhage affecting left non-dominant side: Secondary | ICD-10-CM | POA: Diagnosis not present

## 2020-06-05 DIAGNOSIS — I69352 Hemiplegia and hemiparesis following cerebral infarction affecting left dominant side: Secondary | ICD-10-CM | POA: Diagnosis not present

## 2020-06-05 DIAGNOSIS — M6259 Muscle wasting and atrophy, not elsewhere classified, multiple sites: Secondary | ICD-10-CM | POA: Diagnosis not present

## 2020-06-06 DIAGNOSIS — G8111 Spastic hemiplegia affecting right dominant side: Secondary | ICD-10-CM | POA: Diagnosis not present

## 2020-06-06 DIAGNOSIS — I69054 Hemiplegia and hemiparesis following nontraumatic subarachnoid hemorrhage affecting left non-dominant side: Secondary | ICD-10-CM | POA: Diagnosis not present

## 2020-06-06 DIAGNOSIS — M24571 Contracture, right ankle: Secondary | ICD-10-CM | POA: Diagnosis not present

## 2020-06-06 DIAGNOSIS — M6259 Muscle wasting and atrophy, not elsewhere classified, multiple sites: Secondary | ICD-10-CM | POA: Diagnosis not present

## 2020-06-06 DIAGNOSIS — M6281 Muscle weakness (generalized): Secondary | ICD-10-CM | POA: Diagnosis not present

## 2020-06-06 DIAGNOSIS — I69352 Hemiplegia and hemiparesis following cerebral infarction affecting left dominant side: Secondary | ICD-10-CM | POA: Diagnosis not present

## 2020-06-07 DIAGNOSIS — M24571 Contracture, right ankle: Secondary | ICD-10-CM | POA: Diagnosis not present

## 2020-06-07 DIAGNOSIS — I69352 Hemiplegia and hemiparesis following cerebral infarction affecting left dominant side: Secondary | ICD-10-CM | POA: Diagnosis not present

## 2020-06-07 DIAGNOSIS — M6281 Muscle weakness (generalized): Secondary | ICD-10-CM | POA: Diagnosis not present

## 2020-06-07 DIAGNOSIS — G8111 Spastic hemiplegia affecting right dominant side: Secondary | ICD-10-CM | POA: Diagnosis not present

## 2020-06-07 DIAGNOSIS — I69054 Hemiplegia and hemiparesis following nontraumatic subarachnoid hemorrhage affecting left non-dominant side: Secondary | ICD-10-CM | POA: Diagnosis not present

## 2020-06-07 DIAGNOSIS — M6259 Muscle wasting and atrophy, not elsewhere classified, multiple sites: Secondary | ICD-10-CM | POA: Diagnosis not present

## 2020-06-08 DIAGNOSIS — F338 Other recurrent depressive disorders: Secondary | ICD-10-CM | POA: Diagnosis not present

## 2020-06-08 DIAGNOSIS — M6259 Muscle wasting and atrophy, not elsewhere classified, multiple sites: Secondary | ICD-10-CM | POA: Diagnosis not present

## 2020-06-08 DIAGNOSIS — I69054 Hemiplegia and hemiparesis following nontraumatic subarachnoid hemorrhage affecting left non-dominant side: Secondary | ICD-10-CM | POA: Diagnosis not present

## 2020-06-08 DIAGNOSIS — M24571 Contracture, right ankle: Secondary | ICD-10-CM | POA: Diagnosis not present

## 2020-06-08 DIAGNOSIS — I69352 Hemiplegia and hemiparesis following cerebral infarction affecting left dominant side: Secondary | ICD-10-CM | POA: Diagnosis not present

## 2020-06-08 DIAGNOSIS — G4700 Insomnia, unspecified: Secondary | ICD-10-CM | POA: Diagnosis not present

## 2020-06-08 DIAGNOSIS — G8111 Spastic hemiplegia affecting right dominant side: Secondary | ICD-10-CM | POA: Diagnosis not present

## 2020-06-08 DIAGNOSIS — M6281 Muscle weakness (generalized): Secondary | ICD-10-CM | POA: Diagnosis not present

## 2020-06-11 DIAGNOSIS — I69054 Hemiplegia and hemiparesis following nontraumatic subarachnoid hemorrhage affecting left non-dominant side: Secondary | ICD-10-CM | POA: Diagnosis not present

## 2020-06-11 DIAGNOSIS — G8111 Spastic hemiplegia affecting right dominant side: Secondary | ICD-10-CM | POA: Diagnosis not present

## 2020-06-11 DIAGNOSIS — R4701 Aphasia: Secondary | ICD-10-CM | POA: Diagnosis not present

## 2020-06-11 DIAGNOSIS — M6281 Muscle weakness (generalized): Secondary | ICD-10-CM | POA: Diagnosis not present

## 2020-06-11 DIAGNOSIS — M24571 Contracture, right ankle: Secondary | ICD-10-CM | POA: Diagnosis not present

## 2020-06-11 DIAGNOSIS — I69352 Hemiplegia and hemiparesis following cerebral infarction affecting left dominant side: Secondary | ICD-10-CM | POA: Diagnosis not present

## 2020-06-11 DIAGNOSIS — M6259 Muscle wasting and atrophy, not elsewhere classified, multiple sites: Secondary | ICD-10-CM | POA: Diagnosis not present

## 2020-06-11 DIAGNOSIS — I69391 Dysphagia following cerebral infarction: Secondary | ICD-10-CM | POA: Diagnosis not present

## 2020-06-11 DIAGNOSIS — R278 Other lack of coordination: Secondary | ICD-10-CM | POA: Diagnosis not present

## 2020-06-11 DIAGNOSIS — M24572 Contracture, left ankle: Secondary | ICD-10-CM | POA: Diagnosis not present

## 2020-06-12 DIAGNOSIS — E46 Unspecified protein-calorie malnutrition: Secondary | ICD-10-CM | POA: Diagnosis not present

## 2020-06-12 DIAGNOSIS — I69314 Frontal lobe and executive function deficit following cerebral infarction: Secondary | ICD-10-CM | POA: Diagnosis not present

## 2020-06-12 DIAGNOSIS — I69054 Hemiplegia and hemiparesis following nontraumatic subarachnoid hemorrhage affecting left non-dominant side: Secondary | ICD-10-CM | POA: Diagnosis not present

## 2020-06-12 DIAGNOSIS — M6281 Muscle weakness (generalized): Secondary | ICD-10-CM | POA: Diagnosis not present

## 2020-06-12 DIAGNOSIS — G8111 Spastic hemiplegia affecting right dominant side: Secondary | ICD-10-CM | POA: Diagnosis not present

## 2020-06-12 DIAGNOSIS — M6259 Muscle wasting and atrophy, not elsewhere classified, multiple sites: Secondary | ICD-10-CM | POA: Diagnosis not present

## 2020-06-12 DIAGNOSIS — I2510 Atherosclerotic heart disease of native coronary artery without angina pectoris: Secondary | ICD-10-CM | POA: Diagnosis not present

## 2020-06-12 DIAGNOSIS — M24571 Contracture, right ankle: Secondary | ICD-10-CM | POA: Diagnosis not present

## 2020-06-12 DIAGNOSIS — I69352 Hemiplegia and hemiparesis following cerebral infarction affecting left dominant side: Secondary | ICD-10-CM | POA: Diagnosis not present

## 2020-06-13 DIAGNOSIS — I69352 Hemiplegia and hemiparesis following cerebral infarction affecting left dominant side: Secondary | ICD-10-CM | POA: Diagnosis not present

## 2020-06-13 DIAGNOSIS — M24571 Contracture, right ankle: Secondary | ICD-10-CM | POA: Diagnosis not present

## 2020-06-13 DIAGNOSIS — M6281 Muscle weakness (generalized): Secondary | ICD-10-CM | POA: Diagnosis not present

## 2020-06-13 DIAGNOSIS — I69054 Hemiplegia and hemiparesis following nontraumatic subarachnoid hemorrhage affecting left non-dominant side: Secondary | ICD-10-CM | POA: Diagnosis not present

## 2020-06-13 DIAGNOSIS — G8111 Spastic hemiplegia affecting right dominant side: Secondary | ICD-10-CM | POA: Diagnosis not present

## 2020-06-13 DIAGNOSIS — M6259 Muscle wasting and atrophy, not elsewhere classified, multiple sites: Secondary | ICD-10-CM | POA: Diagnosis not present

## 2020-06-14 DIAGNOSIS — M24571 Contracture, right ankle: Secondary | ICD-10-CM | POA: Diagnosis not present

## 2020-06-14 DIAGNOSIS — I69054 Hemiplegia and hemiparesis following nontraumatic subarachnoid hemorrhage affecting left non-dominant side: Secondary | ICD-10-CM | POA: Diagnosis not present

## 2020-06-14 DIAGNOSIS — M6281 Muscle weakness (generalized): Secondary | ICD-10-CM | POA: Diagnosis not present

## 2020-06-14 DIAGNOSIS — G8111 Spastic hemiplegia affecting right dominant side: Secondary | ICD-10-CM | POA: Diagnosis not present

## 2020-06-14 DIAGNOSIS — M6259 Muscle wasting and atrophy, not elsewhere classified, multiple sites: Secondary | ICD-10-CM | POA: Diagnosis not present

## 2020-06-14 DIAGNOSIS — I69352 Hemiplegia and hemiparesis following cerebral infarction affecting left dominant side: Secondary | ICD-10-CM | POA: Diagnosis not present

## 2020-06-15 DIAGNOSIS — M6259 Muscle wasting and atrophy, not elsewhere classified, multiple sites: Secondary | ICD-10-CM | POA: Diagnosis not present

## 2020-06-15 DIAGNOSIS — I69054 Hemiplegia and hemiparesis following nontraumatic subarachnoid hemorrhage affecting left non-dominant side: Secondary | ICD-10-CM | POA: Diagnosis not present

## 2020-06-15 DIAGNOSIS — G8111 Spastic hemiplegia affecting right dominant side: Secondary | ICD-10-CM | POA: Diagnosis not present

## 2020-06-15 DIAGNOSIS — I69352 Hemiplegia and hemiparesis following cerebral infarction affecting left dominant side: Secondary | ICD-10-CM | POA: Diagnosis not present

## 2020-06-15 DIAGNOSIS — M6281 Muscle weakness (generalized): Secondary | ICD-10-CM | POA: Diagnosis not present

## 2020-06-15 DIAGNOSIS — M24571 Contracture, right ankle: Secondary | ICD-10-CM | POA: Diagnosis not present

## 2020-06-18 DIAGNOSIS — I69054 Hemiplegia and hemiparesis following nontraumatic subarachnoid hemorrhage affecting left non-dominant side: Secondary | ICD-10-CM | POA: Diagnosis not present

## 2020-06-18 DIAGNOSIS — M6259 Muscle wasting and atrophy, not elsewhere classified, multiple sites: Secondary | ICD-10-CM | POA: Diagnosis not present

## 2020-06-18 DIAGNOSIS — M6281 Muscle weakness (generalized): Secondary | ICD-10-CM | POA: Diagnosis not present

## 2020-06-18 DIAGNOSIS — G8111 Spastic hemiplegia affecting right dominant side: Secondary | ICD-10-CM | POA: Diagnosis not present

## 2020-06-18 DIAGNOSIS — I69352 Hemiplegia and hemiparesis following cerebral infarction affecting left dominant side: Secondary | ICD-10-CM | POA: Diagnosis not present

## 2020-06-18 DIAGNOSIS — M24571 Contracture, right ankle: Secondary | ICD-10-CM | POA: Diagnosis not present

## 2020-06-19 DIAGNOSIS — M24571 Contracture, right ankle: Secondary | ICD-10-CM | POA: Diagnosis not present

## 2020-06-19 DIAGNOSIS — M6259 Muscle wasting and atrophy, not elsewhere classified, multiple sites: Secondary | ICD-10-CM | POA: Diagnosis not present

## 2020-06-19 DIAGNOSIS — G8111 Spastic hemiplegia affecting right dominant side: Secondary | ICD-10-CM | POA: Diagnosis not present

## 2020-06-19 DIAGNOSIS — I69352 Hemiplegia and hemiparesis following cerebral infarction affecting left dominant side: Secondary | ICD-10-CM | POA: Diagnosis not present

## 2020-06-19 DIAGNOSIS — I69054 Hemiplegia and hemiparesis following nontraumatic subarachnoid hemorrhage affecting left non-dominant side: Secondary | ICD-10-CM | POA: Diagnosis not present

## 2020-06-19 DIAGNOSIS — M6281 Muscle weakness (generalized): Secondary | ICD-10-CM | POA: Diagnosis not present

## 2020-06-20 DIAGNOSIS — I69054 Hemiplegia and hemiparesis following nontraumatic subarachnoid hemorrhage affecting left non-dominant side: Secondary | ICD-10-CM | POA: Diagnosis not present

## 2020-06-20 DIAGNOSIS — M24571 Contracture, right ankle: Secondary | ICD-10-CM | POA: Diagnosis not present

## 2020-06-20 DIAGNOSIS — M6259 Muscle wasting and atrophy, not elsewhere classified, multiple sites: Secondary | ICD-10-CM | POA: Diagnosis not present

## 2020-06-20 DIAGNOSIS — G8111 Spastic hemiplegia affecting right dominant side: Secondary | ICD-10-CM | POA: Diagnosis not present

## 2020-06-20 DIAGNOSIS — I69352 Hemiplegia and hemiparesis following cerebral infarction affecting left dominant side: Secondary | ICD-10-CM | POA: Diagnosis not present

## 2020-06-20 DIAGNOSIS — M6281 Muscle weakness (generalized): Secondary | ICD-10-CM | POA: Diagnosis not present

## 2020-06-21 DIAGNOSIS — M6281 Muscle weakness (generalized): Secondary | ICD-10-CM | POA: Diagnosis not present

## 2020-06-21 DIAGNOSIS — M24571 Contracture, right ankle: Secondary | ICD-10-CM | POA: Diagnosis not present

## 2020-06-21 DIAGNOSIS — I69054 Hemiplegia and hemiparesis following nontraumatic subarachnoid hemorrhage affecting left non-dominant side: Secondary | ICD-10-CM | POA: Diagnosis not present

## 2020-06-21 DIAGNOSIS — I69352 Hemiplegia and hemiparesis following cerebral infarction affecting left dominant side: Secondary | ICD-10-CM | POA: Diagnosis not present

## 2020-06-21 DIAGNOSIS — M6259 Muscle wasting and atrophy, not elsewhere classified, multiple sites: Secondary | ICD-10-CM | POA: Diagnosis not present

## 2020-06-21 DIAGNOSIS — G8111 Spastic hemiplegia affecting right dominant side: Secondary | ICD-10-CM | POA: Diagnosis not present

## 2020-06-22 DIAGNOSIS — M6259 Muscle wasting and atrophy, not elsewhere classified, multiple sites: Secondary | ICD-10-CM | POA: Diagnosis not present

## 2020-06-22 DIAGNOSIS — Z03818 Encounter for observation for suspected exposure to other biological agents ruled out: Secondary | ICD-10-CM | POA: Diagnosis not present

## 2020-06-22 DIAGNOSIS — G8111 Spastic hemiplegia affecting right dominant side: Secondary | ICD-10-CM | POA: Diagnosis not present

## 2020-06-22 DIAGNOSIS — M6281 Muscle weakness (generalized): Secondary | ICD-10-CM | POA: Diagnosis not present

## 2020-06-22 DIAGNOSIS — I69054 Hemiplegia and hemiparesis following nontraumatic subarachnoid hemorrhage affecting left non-dominant side: Secondary | ICD-10-CM | POA: Diagnosis not present

## 2020-06-22 DIAGNOSIS — I69352 Hemiplegia and hemiparesis following cerebral infarction affecting left dominant side: Secondary | ICD-10-CM | POA: Diagnosis not present

## 2020-06-22 DIAGNOSIS — M24571 Contracture, right ankle: Secondary | ICD-10-CM | POA: Diagnosis not present

## 2020-06-25 DIAGNOSIS — M24571 Contracture, right ankle: Secondary | ICD-10-CM | POA: Diagnosis not present

## 2020-06-25 DIAGNOSIS — G8111 Spastic hemiplegia affecting right dominant side: Secondary | ICD-10-CM | POA: Diagnosis not present

## 2020-06-25 DIAGNOSIS — I69352 Hemiplegia and hemiparesis following cerebral infarction affecting left dominant side: Secondary | ICD-10-CM | POA: Diagnosis not present

## 2020-06-25 DIAGNOSIS — M6281 Muscle weakness (generalized): Secondary | ICD-10-CM | POA: Diagnosis not present

## 2020-06-25 DIAGNOSIS — M6259 Muscle wasting and atrophy, not elsewhere classified, multiple sites: Secondary | ICD-10-CM | POA: Diagnosis not present

## 2020-06-25 DIAGNOSIS — I69054 Hemiplegia and hemiparesis following nontraumatic subarachnoid hemorrhage affecting left non-dominant side: Secondary | ICD-10-CM | POA: Diagnosis not present

## 2020-06-26 DIAGNOSIS — M6281 Muscle weakness (generalized): Secondary | ICD-10-CM | POA: Diagnosis not present

## 2020-06-26 DIAGNOSIS — M6259 Muscle wasting and atrophy, not elsewhere classified, multiple sites: Secondary | ICD-10-CM | POA: Diagnosis not present

## 2020-06-26 DIAGNOSIS — M24571 Contracture, right ankle: Secondary | ICD-10-CM | POA: Diagnosis not present

## 2020-06-26 DIAGNOSIS — I69054 Hemiplegia and hemiparesis following nontraumatic subarachnoid hemorrhage affecting left non-dominant side: Secondary | ICD-10-CM | POA: Diagnosis not present

## 2020-06-26 DIAGNOSIS — I69352 Hemiplegia and hemiparesis following cerebral infarction affecting left dominant side: Secondary | ICD-10-CM | POA: Diagnosis not present

## 2020-06-26 DIAGNOSIS — G8111 Spastic hemiplegia affecting right dominant side: Secondary | ICD-10-CM | POA: Diagnosis not present

## 2020-06-27 DIAGNOSIS — G8111 Spastic hemiplegia affecting right dominant side: Secondary | ICD-10-CM | POA: Diagnosis not present

## 2020-06-27 DIAGNOSIS — M24571 Contracture, right ankle: Secondary | ICD-10-CM | POA: Diagnosis not present

## 2020-06-27 DIAGNOSIS — I69352 Hemiplegia and hemiparesis following cerebral infarction affecting left dominant side: Secondary | ICD-10-CM | POA: Diagnosis not present

## 2020-06-27 DIAGNOSIS — M6259 Muscle wasting and atrophy, not elsewhere classified, multiple sites: Secondary | ICD-10-CM | POA: Diagnosis not present

## 2020-06-27 DIAGNOSIS — I69054 Hemiplegia and hemiparesis following nontraumatic subarachnoid hemorrhage affecting left non-dominant side: Secondary | ICD-10-CM | POA: Diagnosis not present

## 2020-06-27 DIAGNOSIS — M6281 Muscle weakness (generalized): Secondary | ICD-10-CM | POA: Diagnosis not present

## 2020-06-28 DIAGNOSIS — I69054 Hemiplegia and hemiparesis following nontraumatic subarachnoid hemorrhage affecting left non-dominant side: Secondary | ICD-10-CM | POA: Diagnosis not present

## 2020-06-28 DIAGNOSIS — M6259 Muscle wasting and atrophy, not elsewhere classified, multiple sites: Secondary | ICD-10-CM | POA: Diagnosis not present

## 2020-06-28 DIAGNOSIS — I69352 Hemiplegia and hemiparesis following cerebral infarction affecting left dominant side: Secondary | ICD-10-CM | POA: Diagnosis not present

## 2020-06-28 DIAGNOSIS — M6281 Muscle weakness (generalized): Secondary | ICD-10-CM | POA: Diagnosis not present

## 2020-06-28 DIAGNOSIS — G8111 Spastic hemiplegia affecting right dominant side: Secondary | ICD-10-CM | POA: Diagnosis not present

## 2020-06-28 DIAGNOSIS — M24571 Contracture, right ankle: Secondary | ICD-10-CM | POA: Diagnosis not present

## 2020-06-29 DIAGNOSIS — M24571 Contracture, right ankle: Secondary | ICD-10-CM | POA: Diagnosis not present

## 2020-06-29 DIAGNOSIS — M6281 Muscle weakness (generalized): Secondary | ICD-10-CM | POA: Diagnosis not present

## 2020-06-29 DIAGNOSIS — I69054 Hemiplegia and hemiparesis following nontraumatic subarachnoid hemorrhage affecting left non-dominant side: Secondary | ICD-10-CM | POA: Diagnosis not present

## 2020-06-29 DIAGNOSIS — Z20828 Contact with and (suspected) exposure to other viral communicable diseases: Secondary | ICD-10-CM | POA: Diagnosis not present

## 2020-06-29 DIAGNOSIS — I69352 Hemiplegia and hemiparesis following cerebral infarction affecting left dominant side: Secondary | ICD-10-CM | POA: Diagnosis not present

## 2020-06-29 DIAGNOSIS — G8111 Spastic hemiplegia affecting right dominant side: Secondary | ICD-10-CM | POA: Diagnosis not present

## 2020-06-29 DIAGNOSIS — M6259 Muscle wasting and atrophy, not elsewhere classified, multiple sites: Secondary | ICD-10-CM | POA: Diagnosis not present

## 2020-07-02 DIAGNOSIS — M6259 Muscle wasting and atrophy, not elsewhere classified, multiple sites: Secondary | ICD-10-CM | POA: Diagnosis not present

## 2020-07-02 DIAGNOSIS — I69054 Hemiplegia and hemiparesis following nontraumatic subarachnoid hemorrhage affecting left non-dominant side: Secondary | ICD-10-CM | POA: Diagnosis not present

## 2020-07-02 DIAGNOSIS — M24571 Contracture, right ankle: Secondary | ICD-10-CM | POA: Diagnosis not present

## 2020-07-02 DIAGNOSIS — G8111 Spastic hemiplegia affecting right dominant side: Secondary | ICD-10-CM | POA: Diagnosis not present

## 2020-07-02 DIAGNOSIS — I69352 Hemiplegia and hemiparesis following cerebral infarction affecting left dominant side: Secondary | ICD-10-CM | POA: Diagnosis not present

## 2020-07-02 DIAGNOSIS — M6281 Muscle weakness (generalized): Secondary | ICD-10-CM | POA: Diagnosis not present

## 2020-07-03 DIAGNOSIS — M24571 Contracture, right ankle: Secondary | ICD-10-CM | POA: Diagnosis not present

## 2020-07-03 DIAGNOSIS — I69054 Hemiplegia and hemiparesis following nontraumatic subarachnoid hemorrhage affecting left non-dominant side: Secondary | ICD-10-CM | POA: Diagnosis not present

## 2020-07-03 DIAGNOSIS — I69352 Hemiplegia and hemiparesis following cerebral infarction affecting left dominant side: Secondary | ICD-10-CM | POA: Diagnosis not present

## 2020-07-03 DIAGNOSIS — M6259 Muscle wasting and atrophy, not elsewhere classified, multiple sites: Secondary | ICD-10-CM | POA: Diagnosis not present

## 2020-07-03 DIAGNOSIS — G8111 Spastic hemiplegia affecting right dominant side: Secondary | ICD-10-CM | POA: Diagnosis not present

## 2020-07-03 DIAGNOSIS — M6281 Muscle weakness (generalized): Secondary | ICD-10-CM | POA: Diagnosis not present

## 2020-07-04 DIAGNOSIS — G8111 Spastic hemiplegia affecting right dominant side: Secondary | ICD-10-CM | POA: Diagnosis not present

## 2020-07-04 DIAGNOSIS — M6281 Muscle weakness (generalized): Secondary | ICD-10-CM | POA: Diagnosis not present

## 2020-07-04 DIAGNOSIS — I69054 Hemiplegia and hemiparesis following nontraumatic subarachnoid hemorrhage affecting left non-dominant side: Secondary | ICD-10-CM | POA: Diagnosis not present

## 2020-07-04 DIAGNOSIS — M6259 Muscle wasting and atrophy, not elsewhere classified, multiple sites: Secondary | ICD-10-CM | POA: Diagnosis not present

## 2020-07-04 DIAGNOSIS — I69352 Hemiplegia and hemiparesis following cerebral infarction affecting left dominant side: Secondary | ICD-10-CM | POA: Diagnosis not present

## 2020-07-04 DIAGNOSIS — M24571 Contracture, right ankle: Secondary | ICD-10-CM | POA: Diagnosis not present

## 2020-07-05 DIAGNOSIS — I69054 Hemiplegia and hemiparesis following nontraumatic subarachnoid hemorrhage affecting left non-dominant side: Secondary | ICD-10-CM | POA: Diagnosis not present

## 2020-07-05 DIAGNOSIS — M24571 Contracture, right ankle: Secondary | ICD-10-CM | POA: Diagnosis not present

## 2020-07-05 DIAGNOSIS — M6281 Muscle weakness (generalized): Secondary | ICD-10-CM | POA: Diagnosis not present

## 2020-07-05 DIAGNOSIS — I69352 Hemiplegia and hemiparesis following cerebral infarction affecting left dominant side: Secondary | ICD-10-CM | POA: Diagnosis not present

## 2020-07-05 DIAGNOSIS — G8111 Spastic hemiplegia affecting right dominant side: Secondary | ICD-10-CM | POA: Diagnosis not present

## 2020-07-05 DIAGNOSIS — M6259 Muscle wasting and atrophy, not elsewhere classified, multiple sites: Secondary | ICD-10-CM | POA: Diagnosis not present

## 2020-07-06 DIAGNOSIS — G8111 Spastic hemiplegia affecting right dominant side: Secondary | ICD-10-CM | POA: Diagnosis not present

## 2020-07-06 DIAGNOSIS — I69352 Hemiplegia and hemiparesis following cerebral infarction affecting left dominant side: Secondary | ICD-10-CM | POA: Diagnosis not present

## 2020-07-06 DIAGNOSIS — M6281 Muscle weakness (generalized): Secondary | ICD-10-CM | POA: Diagnosis not present

## 2020-07-06 DIAGNOSIS — M6259 Muscle wasting and atrophy, not elsewhere classified, multiple sites: Secondary | ICD-10-CM | POA: Diagnosis not present

## 2020-07-06 DIAGNOSIS — I69054 Hemiplegia and hemiparesis following nontraumatic subarachnoid hemorrhage affecting left non-dominant side: Secondary | ICD-10-CM | POA: Diagnosis not present

## 2020-07-06 DIAGNOSIS — M24571 Contracture, right ankle: Secondary | ICD-10-CM | POA: Diagnosis not present

## 2020-07-09 DIAGNOSIS — I69054 Hemiplegia and hemiparesis following nontraumatic subarachnoid hemorrhage affecting left non-dominant side: Secondary | ICD-10-CM | POA: Diagnosis not present

## 2020-07-09 DIAGNOSIS — M6281 Muscle weakness (generalized): Secondary | ICD-10-CM | POA: Diagnosis not present

## 2020-07-09 DIAGNOSIS — M6259 Muscle wasting and atrophy, not elsewhere classified, multiple sites: Secondary | ICD-10-CM | POA: Diagnosis not present

## 2020-07-09 DIAGNOSIS — M24571 Contracture, right ankle: Secondary | ICD-10-CM | POA: Diagnosis not present

## 2020-07-09 DIAGNOSIS — I69352 Hemiplegia and hemiparesis following cerebral infarction affecting left dominant side: Secondary | ICD-10-CM | POA: Diagnosis not present

## 2020-07-09 DIAGNOSIS — G8111 Spastic hemiplegia affecting right dominant side: Secondary | ICD-10-CM | POA: Diagnosis not present

## 2020-07-10 DIAGNOSIS — M6281 Muscle weakness (generalized): Secondary | ICD-10-CM | POA: Diagnosis not present

## 2020-07-10 DIAGNOSIS — G8111 Spastic hemiplegia affecting right dominant side: Secondary | ICD-10-CM | POA: Diagnosis not present

## 2020-07-10 DIAGNOSIS — M24571 Contracture, right ankle: Secondary | ICD-10-CM | POA: Diagnosis not present

## 2020-07-10 DIAGNOSIS — M6259 Muscle wasting and atrophy, not elsewhere classified, multiple sites: Secondary | ICD-10-CM | POA: Diagnosis not present

## 2020-07-10 DIAGNOSIS — I69054 Hemiplegia and hemiparesis following nontraumatic subarachnoid hemorrhage affecting left non-dominant side: Secondary | ICD-10-CM | POA: Diagnosis not present

## 2020-07-10 DIAGNOSIS — I69352 Hemiplegia and hemiparesis following cerebral infarction affecting left dominant side: Secondary | ICD-10-CM | POA: Diagnosis not present

## 2020-07-11 DIAGNOSIS — I69352 Hemiplegia and hemiparesis following cerebral infarction affecting left dominant side: Secondary | ICD-10-CM | POA: Diagnosis not present

## 2020-07-11 DIAGNOSIS — I69391 Dysphagia following cerebral infarction: Secondary | ICD-10-CM | POA: Diagnosis not present

## 2020-07-11 DIAGNOSIS — M6259 Muscle wasting and atrophy, not elsewhere classified, multiple sites: Secondary | ICD-10-CM | POA: Diagnosis not present

## 2020-07-11 DIAGNOSIS — I69054 Hemiplegia and hemiparesis following nontraumatic subarachnoid hemorrhage affecting left non-dominant side: Secondary | ICD-10-CM | POA: Diagnosis not present

## 2020-07-11 DIAGNOSIS — R4701 Aphasia: Secondary | ICD-10-CM | POA: Diagnosis not present

## 2020-07-11 DIAGNOSIS — M6281 Muscle weakness (generalized): Secondary | ICD-10-CM | POA: Diagnosis not present

## 2020-07-11 DIAGNOSIS — M24572 Contracture, left ankle: Secondary | ICD-10-CM | POA: Diagnosis not present

## 2020-07-11 DIAGNOSIS — R278 Other lack of coordination: Secondary | ICD-10-CM | POA: Diagnosis not present

## 2020-07-11 DIAGNOSIS — G8111 Spastic hemiplegia affecting right dominant side: Secondary | ICD-10-CM | POA: Diagnosis not present

## 2020-07-11 DIAGNOSIS — M24571 Contracture, right ankle: Secondary | ICD-10-CM | POA: Diagnosis not present

## 2020-07-11 DIAGNOSIS — M19012 Primary osteoarthritis, left shoulder: Secondary | ICD-10-CM | POA: Diagnosis not present

## 2020-07-12 DIAGNOSIS — M24571 Contracture, right ankle: Secondary | ICD-10-CM | POA: Diagnosis not present

## 2020-07-12 DIAGNOSIS — I69054 Hemiplegia and hemiparesis following nontraumatic subarachnoid hemorrhage affecting left non-dominant side: Secondary | ICD-10-CM | POA: Diagnosis not present

## 2020-07-12 DIAGNOSIS — I69352 Hemiplegia and hemiparesis following cerebral infarction affecting left dominant side: Secondary | ICD-10-CM | POA: Diagnosis not present

## 2020-07-12 DIAGNOSIS — M6259 Muscle wasting and atrophy, not elsewhere classified, multiple sites: Secondary | ICD-10-CM | POA: Diagnosis not present

## 2020-07-12 DIAGNOSIS — G8111 Spastic hemiplegia affecting right dominant side: Secondary | ICD-10-CM | POA: Diagnosis not present

## 2020-07-12 DIAGNOSIS — M6281 Muscle weakness (generalized): Secondary | ICD-10-CM | POA: Diagnosis not present

## 2020-07-13 DIAGNOSIS — I69054 Hemiplegia and hemiparesis following nontraumatic subarachnoid hemorrhage affecting left non-dominant side: Secondary | ICD-10-CM | POA: Diagnosis not present

## 2020-07-13 DIAGNOSIS — I69352 Hemiplegia and hemiparesis following cerebral infarction affecting left dominant side: Secondary | ICD-10-CM | POA: Diagnosis not present

## 2020-07-13 DIAGNOSIS — M6281 Muscle weakness (generalized): Secondary | ICD-10-CM | POA: Diagnosis not present

## 2020-07-13 DIAGNOSIS — G8111 Spastic hemiplegia affecting right dominant side: Secondary | ICD-10-CM | POA: Diagnosis not present

## 2020-07-13 DIAGNOSIS — M24571 Contracture, right ankle: Secondary | ICD-10-CM | POA: Diagnosis not present

## 2020-07-13 DIAGNOSIS — M6259 Muscle wasting and atrophy, not elsewhere classified, multiple sites: Secondary | ICD-10-CM | POA: Diagnosis not present

## 2020-07-16 DIAGNOSIS — F338 Other recurrent depressive disorders: Secondary | ICD-10-CM | POA: Diagnosis not present

## 2020-07-16 DIAGNOSIS — I69352 Hemiplegia and hemiparesis following cerebral infarction affecting left dominant side: Secondary | ICD-10-CM | POA: Diagnosis not present

## 2020-07-16 DIAGNOSIS — G4700 Insomnia, unspecified: Secondary | ICD-10-CM | POA: Diagnosis not present

## 2020-07-16 DIAGNOSIS — G8111 Spastic hemiplegia affecting right dominant side: Secondary | ICD-10-CM | POA: Diagnosis not present

## 2020-07-16 DIAGNOSIS — B379 Candidiasis, unspecified: Secondary | ICD-10-CM | POA: Diagnosis not present

## 2020-07-16 DIAGNOSIS — M6281 Muscle weakness (generalized): Secondary | ICD-10-CM | POA: Diagnosis not present

## 2020-07-16 DIAGNOSIS — I69054 Hemiplegia and hemiparesis following nontraumatic subarachnoid hemorrhage affecting left non-dominant side: Secondary | ICD-10-CM | POA: Diagnosis not present

## 2020-07-16 DIAGNOSIS — M6259 Muscle wasting and atrophy, not elsewhere classified, multiple sites: Secondary | ICD-10-CM | POA: Diagnosis not present

## 2020-07-16 DIAGNOSIS — M24571 Contracture, right ankle: Secondary | ICD-10-CM | POA: Diagnosis not present

## 2020-07-17 DIAGNOSIS — I69054 Hemiplegia and hemiparesis following nontraumatic subarachnoid hemorrhage affecting left non-dominant side: Secondary | ICD-10-CM | POA: Diagnosis not present

## 2020-07-17 DIAGNOSIS — M6281 Muscle weakness (generalized): Secondary | ICD-10-CM | POA: Diagnosis not present

## 2020-07-17 DIAGNOSIS — M6259 Muscle wasting and atrophy, not elsewhere classified, multiple sites: Secondary | ICD-10-CM | POA: Diagnosis not present

## 2020-07-17 DIAGNOSIS — I69352 Hemiplegia and hemiparesis following cerebral infarction affecting left dominant side: Secondary | ICD-10-CM | POA: Diagnosis not present

## 2020-07-17 DIAGNOSIS — G8111 Spastic hemiplegia affecting right dominant side: Secondary | ICD-10-CM | POA: Diagnosis not present

## 2020-07-17 DIAGNOSIS — M24571 Contracture, right ankle: Secondary | ICD-10-CM | POA: Diagnosis not present

## 2020-07-18 DIAGNOSIS — M6281 Muscle weakness (generalized): Secondary | ICD-10-CM | POA: Diagnosis not present

## 2020-07-18 DIAGNOSIS — I69054 Hemiplegia and hemiparesis following nontraumatic subarachnoid hemorrhage affecting left non-dominant side: Secondary | ICD-10-CM | POA: Diagnosis not present

## 2020-07-18 DIAGNOSIS — G8111 Spastic hemiplegia affecting right dominant side: Secondary | ICD-10-CM | POA: Diagnosis not present

## 2020-07-18 DIAGNOSIS — M6259 Muscle wasting and atrophy, not elsewhere classified, multiple sites: Secondary | ICD-10-CM | POA: Diagnosis not present

## 2020-07-18 DIAGNOSIS — I69352 Hemiplegia and hemiparesis following cerebral infarction affecting left dominant side: Secondary | ICD-10-CM | POA: Diagnosis not present

## 2020-07-18 DIAGNOSIS — M24571 Contracture, right ankle: Secondary | ICD-10-CM | POA: Diagnosis not present

## 2020-07-19 DIAGNOSIS — G8111 Spastic hemiplegia affecting right dominant side: Secondary | ICD-10-CM | POA: Diagnosis not present

## 2020-07-19 DIAGNOSIS — M6259 Muscle wasting and atrophy, not elsewhere classified, multiple sites: Secondary | ICD-10-CM | POA: Diagnosis not present

## 2020-07-19 DIAGNOSIS — I69352 Hemiplegia and hemiparesis following cerebral infarction affecting left dominant side: Secondary | ICD-10-CM | POA: Diagnosis not present

## 2020-07-19 DIAGNOSIS — M24571 Contracture, right ankle: Secondary | ICD-10-CM | POA: Diagnosis not present

## 2020-07-19 DIAGNOSIS — E46 Unspecified protein-calorie malnutrition: Secondary | ICD-10-CM | POA: Diagnosis not present

## 2020-07-19 DIAGNOSIS — I69054 Hemiplegia and hemiparesis following nontraumatic subarachnoid hemorrhage affecting left non-dominant side: Secondary | ICD-10-CM | POA: Diagnosis not present

## 2020-07-19 DIAGNOSIS — B379 Candidiasis, unspecified: Secondary | ICD-10-CM | POA: Diagnosis not present

## 2020-07-19 DIAGNOSIS — E1169 Type 2 diabetes mellitus with other specified complication: Secondary | ICD-10-CM | POA: Diagnosis not present

## 2020-07-19 DIAGNOSIS — M6281 Muscle weakness (generalized): Secondary | ICD-10-CM | POA: Diagnosis not present

## 2020-07-30 DIAGNOSIS — F338 Other recurrent depressive disorders: Secondary | ICD-10-CM | POA: Diagnosis not present

## 2020-07-30 DIAGNOSIS — G4700 Insomnia, unspecified: Secondary | ICD-10-CM | POA: Diagnosis not present

## 2020-08-01 DIAGNOSIS — R0989 Other specified symptoms and signs involving the circulatory and respiratory systems: Secondary | ICD-10-CM | POA: Diagnosis not present

## 2020-08-01 DIAGNOSIS — J208 Acute bronchitis due to other specified organisms: Secondary | ICD-10-CM | POA: Diagnosis not present

## 2020-08-02 DIAGNOSIS — J208 Acute bronchitis due to other specified organisms: Secondary | ICD-10-CM | POA: Diagnosis not present

## 2020-08-02 DIAGNOSIS — I69352 Hemiplegia and hemiparesis following cerebral infarction affecting left dominant side: Secondary | ICD-10-CM | POA: Diagnosis not present

## 2020-08-06 DIAGNOSIS — J069 Acute upper respiratory infection, unspecified: Secondary | ICD-10-CM | POA: Diagnosis not present

## 2020-08-06 DIAGNOSIS — R059 Cough, unspecified: Secondary | ICD-10-CM | POA: Diagnosis not present

## 2020-08-06 DIAGNOSIS — I712 Thoracic aortic aneurysm, without rupture: Secondary | ICD-10-CM | POA: Diagnosis not present

## 2020-08-06 DIAGNOSIS — R0989 Other specified symptoms and signs involving the circulatory and respiratory systems: Secondary | ICD-10-CM | POA: Diagnosis not present

## 2020-08-06 DIAGNOSIS — J208 Acute bronchitis due to other specified organisms: Secondary | ICD-10-CM | POA: Diagnosis not present

## 2020-08-22 DIAGNOSIS — I69352 Hemiplegia and hemiparesis following cerebral infarction affecting left dominant side: Secondary | ICD-10-CM | POA: Diagnosis not present

## 2020-08-22 DIAGNOSIS — I2699 Other pulmonary embolism without acute cor pulmonale: Secondary | ICD-10-CM | POA: Diagnosis not present

## 2020-08-22 DIAGNOSIS — E1169 Type 2 diabetes mellitus with other specified complication: Secondary | ICD-10-CM | POA: Diagnosis not present

## 2020-08-22 DIAGNOSIS — E785 Hyperlipidemia, unspecified: Secondary | ICD-10-CM | POA: Diagnosis not present

## 2020-08-28 ENCOUNTER — Telehealth: Payer: Self-pay | Admitting: Family Medicine

## 2020-08-28 NOTE — Telephone Encounter (Signed)
John son of patient was calling to check the status of paperwork he had faxed over on Friday. I looked on Dr. Antony Contras desk and I don't see it. He is going to refax and put attn: Larene Beach on it. He said it was from Medicare/Medicaid needing a signature stating patient needed to live in a nursing home.   Once I receive fax I will forward to Dr. Dennard Schaumann  CB# [phone]

## 2020-08-28 NOTE — Telephone Encounter (Signed)
I have received fax and son is aware that we are placing on Dr. Samella Parr desk.  CB# [phone]

## 2020-08-30 NOTE — Telephone Encounter (Signed)
Son called back to see if the paperwork had been filled out. I have checked and seen that it was filled out waiting to be faxed. I have faxed and advised patients son that I was faxing.

## 2020-09-01 DIAGNOSIS — M70822 Other soft tissue disorders related to use, overuse and pressure, left upper arm: Secondary | ICD-10-CM | POA: Diagnosis not present

## 2020-09-01 DIAGNOSIS — M25532 Pain in left wrist: Secondary | ICD-10-CM | POA: Diagnosis not present

## 2020-09-01 DIAGNOSIS — M70821 Other soft tissue disorders related to use, overuse and pressure, right upper arm: Secondary | ICD-10-CM | POA: Diagnosis not present

## 2020-09-01 DIAGNOSIS — M25531 Pain in right wrist: Secondary | ICD-10-CM | POA: Diagnosis not present

## 2020-09-01 DIAGNOSIS — M21751 Unequal limb length (acquired), right femur: Secondary | ICD-10-CM | POA: Diagnosis not present

## 2020-09-01 DIAGNOSIS — M25521 Pain in right elbow: Secondary | ICD-10-CM | POA: Diagnosis not present

## 2020-09-01 DIAGNOSIS — M25441 Effusion, right hand: Secondary | ICD-10-CM | POA: Diagnosis not present

## 2020-09-01 DIAGNOSIS — M79622 Pain in left upper arm: Secondary | ICD-10-CM | POA: Diagnosis not present

## 2020-09-01 DIAGNOSIS — M79621 Pain in right upper arm: Secondary | ICD-10-CM | POA: Diagnosis not present

## 2020-09-01 DIAGNOSIS — M25442 Effusion, left hand: Secondary | ICD-10-CM | POA: Diagnosis not present

## 2020-09-01 DIAGNOSIS — M25512 Pain in left shoulder: Secondary | ICD-10-CM | POA: Diagnosis not present

## 2020-09-01 DIAGNOSIS — M25522 Pain in left elbow: Secondary | ICD-10-CM | POA: Diagnosis not present

## 2020-09-03 DIAGNOSIS — G4700 Insomnia, unspecified: Secondary | ICD-10-CM | POA: Diagnosis not present

## 2020-09-03 DIAGNOSIS — W19XXXA Unspecified fall, initial encounter: Secondary | ICD-10-CM | POA: Diagnosis not present

## 2020-09-03 DIAGNOSIS — I69352 Hemiplegia and hemiparesis following cerebral infarction affecting left dominant side: Secondary | ICD-10-CM | POA: Diagnosis not present

## 2020-09-03 DIAGNOSIS — F338 Other recurrent depressive disorders: Secondary | ICD-10-CM | POA: Diagnosis not present

## 2020-09-06 DIAGNOSIS — M24571 Contracture, right ankle: Secondary | ICD-10-CM | POA: Diagnosis not present

## 2020-09-06 DIAGNOSIS — I69391 Dysphagia following cerebral infarction: Secondary | ICD-10-CM | POA: Diagnosis not present

## 2020-09-06 DIAGNOSIS — R278 Other lack of coordination: Secondary | ICD-10-CM | POA: Diagnosis not present

## 2020-09-06 DIAGNOSIS — R4701 Aphasia: Secondary | ICD-10-CM | POA: Diagnosis not present

## 2020-09-06 DIAGNOSIS — M6259 Muscle wasting and atrophy, not elsewhere classified, multiple sites: Secondary | ICD-10-CM | POA: Diagnosis not present

## 2020-09-06 DIAGNOSIS — M6281 Muscle weakness (generalized): Secondary | ICD-10-CM | POA: Diagnosis not present

## 2020-09-06 DIAGNOSIS — G8111 Spastic hemiplegia affecting right dominant side: Secondary | ICD-10-CM | POA: Diagnosis not present

## 2020-09-06 DIAGNOSIS — M24572 Contracture, left ankle: Secondary | ICD-10-CM | POA: Diagnosis not present

## 2020-09-06 DIAGNOSIS — R296 Repeated falls: Secondary | ICD-10-CM | POA: Diagnosis not present

## 2020-09-06 DIAGNOSIS — I69352 Hemiplegia and hemiparesis following cerebral infarction affecting left dominant side: Secondary | ICD-10-CM | POA: Diagnosis not present

## 2020-09-06 DIAGNOSIS — I69054 Hemiplegia and hemiparesis following nontraumatic subarachnoid hemorrhage affecting left non-dominant side: Secondary | ICD-10-CM | POA: Diagnosis not present

## 2020-09-07 DIAGNOSIS — G8111 Spastic hemiplegia affecting right dominant side: Secondary | ICD-10-CM | POA: Diagnosis not present

## 2020-09-07 DIAGNOSIS — I69352 Hemiplegia and hemiparesis following cerebral infarction affecting left dominant side: Secondary | ICD-10-CM | POA: Diagnosis not present

## 2020-09-07 DIAGNOSIS — M24571 Contracture, right ankle: Secondary | ICD-10-CM | POA: Diagnosis not present

## 2020-09-07 DIAGNOSIS — M6259 Muscle wasting and atrophy, not elsewhere classified, multiple sites: Secondary | ICD-10-CM | POA: Diagnosis not present

## 2020-09-07 DIAGNOSIS — M6281 Muscle weakness (generalized): Secondary | ICD-10-CM | POA: Diagnosis not present

## 2020-09-07 DIAGNOSIS — I69054 Hemiplegia and hemiparesis following nontraumatic subarachnoid hemorrhage affecting left non-dominant side: Secondary | ICD-10-CM | POA: Diagnosis not present

## 2020-09-10 DIAGNOSIS — G8111 Spastic hemiplegia affecting right dominant side: Secondary | ICD-10-CM | POA: Diagnosis not present

## 2020-09-10 DIAGNOSIS — I69054 Hemiplegia and hemiparesis following nontraumatic subarachnoid hemorrhage affecting left non-dominant side: Secondary | ICD-10-CM | POA: Diagnosis not present

## 2020-09-10 DIAGNOSIS — M6259 Muscle wasting and atrophy, not elsewhere classified, multiple sites: Secondary | ICD-10-CM | POA: Diagnosis not present

## 2020-09-10 DIAGNOSIS — M6281 Muscle weakness (generalized): Secondary | ICD-10-CM | POA: Diagnosis not present

## 2020-09-10 DIAGNOSIS — I69352 Hemiplegia and hemiparesis following cerebral infarction affecting left dominant side: Secondary | ICD-10-CM | POA: Diagnosis not present

## 2020-09-10 DIAGNOSIS — M24571 Contracture, right ankle: Secondary | ICD-10-CM | POA: Diagnosis not present

## 2020-09-11 DIAGNOSIS — R278 Other lack of coordination: Secondary | ICD-10-CM | POA: Diagnosis not present

## 2020-09-11 DIAGNOSIS — E860 Dehydration: Secondary | ICD-10-CM | POA: Diagnosis not present

## 2020-09-11 DIAGNOSIS — R4701 Aphasia: Secondary | ICD-10-CM | POA: Diagnosis not present

## 2020-09-11 DIAGNOSIS — G8111 Spastic hemiplegia affecting right dominant side: Secondary | ICD-10-CM | POA: Diagnosis not present

## 2020-09-11 DIAGNOSIS — I771 Stricture of artery: Secondary | ICD-10-CM | POA: Diagnosis not present

## 2020-09-11 DIAGNOSIS — M24572 Contracture, left ankle: Secondary | ICD-10-CM | POA: Diagnosis not present

## 2020-09-11 DIAGNOSIS — I69054 Hemiplegia and hemiparesis following nontraumatic subarachnoid hemorrhage affecting left non-dominant side: Secondary | ICD-10-CM | POA: Diagnosis not present

## 2020-09-11 DIAGNOSIS — R1311 Dysphagia, oral phase: Secondary | ICD-10-CM | POA: Diagnosis not present

## 2020-09-11 DIAGNOSIS — M24571 Contracture, right ankle: Secondary | ICD-10-CM | POA: Diagnosis not present

## 2020-09-11 DIAGNOSIS — M6259 Muscle wasting and atrophy, not elsewhere classified, multiple sites: Secondary | ICD-10-CM | POA: Diagnosis not present

## 2020-09-11 DIAGNOSIS — I69352 Hemiplegia and hemiparesis following cerebral infarction affecting left dominant side: Secondary | ICD-10-CM | POA: Diagnosis not present

## 2020-09-11 DIAGNOSIS — I69391 Dysphagia following cerebral infarction: Secondary | ICD-10-CM | POA: Diagnosis not present

## 2020-09-11 DIAGNOSIS — R296 Repeated falls: Secondary | ICD-10-CM | POA: Diagnosis not present

## 2020-09-11 DIAGNOSIS — M6281 Muscle weakness (generalized): Secondary | ICD-10-CM | POA: Diagnosis not present

## 2020-09-11 DIAGNOSIS — R1312 Dysphagia, oropharyngeal phase: Secondary | ICD-10-CM | POA: Diagnosis not present

## 2020-09-12 DIAGNOSIS — I69352 Hemiplegia and hemiparesis following cerebral infarction affecting left dominant side: Secondary | ICD-10-CM | POA: Diagnosis not present

## 2020-09-12 DIAGNOSIS — M6259 Muscle wasting and atrophy, not elsewhere classified, multiple sites: Secondary | ICD-10-CM | POA: Diagnosis not present

## 2020-09-12 DIAGNOSIS — M6281 Muscle weakness (generalized): Secondary | ICD-10-CM | POA: Diagnosis not present

## 2020-09-12 DIAGNOSIS — I69054 Hemiplegia and hemiparesis following nontraumatic subarachnoid hemorrhage affecting left non-dominant side: Secondary | ICD-10-CM | POA: Diagnosis not present

## 2020-09-12 DIAGNOSIS — G8111 Spastic hemiplegia affecting right dominant side: Secondary | ICD-10-CM | POA: Diagnosis not present

## 2020-09-12 DIAGNOSIS — M24571 Contracture, right ankle: Secondary | ICD-10-CM | POA: Diagnosis not present

## 2020-09-16 DIAGNOSIS — M6281 Muscle weakness (generalized): Secondary | ICD-10-CM | POA: Diagnosis not present

## 2020-09-16 DIAGNOSIS — M24571 Contracture, right ankle: Secondary | ICD-10-CM | POA: Diagnosis not present

## 2020-09-16 DIAGNOSIS — M6259 Muscle wasting and atrophy, not elsewhere classified, multiple sites: Secondary | ICD-10-CM | POA: Diagnosis not present

## 2020-09-16 DIAGNOSIS — I69054 Hemiplegia and hemiparesis following nontraumatic subarachnoid hemorrhage affecting left non-dominant side: Secondary | ICD-10-CM | POA: Diagnosis not present

## 2020-09-16 DIAGNOSIS — G8111 Spastic hemiplegia affecting right dominant side: Secondary | ICD-10-CM | POA: Diagnosis not present

## 2020-09-16 DIAGNOSIS — I69352 Hemiplegia and hemiparesis following cerebral infarction affecting left dominant side: Secondary | ICD-10-CM | POA: Diagnosis not present

## 2020-09-17 DIAGNOSIS — I69352 Hemiplegia and hemiparesis following cerebral infarction affecting left dominant side: Secondary | ICD-10-CM | POA: Diagnosis not present

## 2020-09-17 DIAGNOSIS — M6259 Muscle wasting and atrophy, not elsewhere classified, multiple sites: Secondary | ICD-10-CM | POA: Diagnosis not present

## 2020-09-17 DIAGNOSIS — G8111 Spastic hemiplegia affecting right dominant side: Secondary | ICD-10-CM | POA: Diagnosis not present

## 2020-09-17 DIAGNOSIS — M6281 Muscle weakness (generalized): Secondary | ICD-10-CM | POA: Diagnosis not present

## 2020-09-17 DIAGNOSIS — I69054 Hemiplegia and hemiparesis following nontraumatic subarachnoid hemorrhage affecting left non-dominant side: Secondary | ICD-10-CM | POA: Diagnosis not present

## 2020-09-17 DIAGNOSIS — M24571 Contracture, right ankle: Secondary | ICD-10-CM | POA: Diagnosis not present

## 2020-09-18 DIAGNOSIS — M24571 Contracture, right ankle: Secondary | ICD-10-CM | POA: Diagnosis not present

## 2020-09-18 DIAGNOSIS — I69352 Hemiplegia and hemiparesis following cerebral infarction affecting left dominant side: Secondary | ICD-10-CM | POA: Diagnosis not present

## 2020-09-18 DIAGNOSIS — I69054 Hemiplegia and hemiparesis following nontraumatic subarachnoid hemorrhage affecting left non-dominant side: Secondary | ICD-10-CM | POA: Diagnosis not present

## 2020-09-18 DIAGNOSIS — G8111 Spastic hemiplegia affecting right dominant side: Secondary | ICD-10-CM | POA: Diagnosis not present

## 2020-09-18 DIAGNOSIS — M6259 Muscle wasting and atrophy, not elsewhere classified, multiple sites: Secondary | ICD-10-CM | POA: Diagnosis not present

## 2020-09-18 DIAGNOSIS — M6281 Muscle weakness (generalized): Secondary | ICD-10-CM | POA: Diagnosis not present

## 2020-09-19 DIAGNOSIS — I69054 Hemiplegia and hemiparesis following nontraumatic subarachnoid hemorrhage affecting left non-dominant side: Secondary | ICD-10-CM | POA: Diagnosis not present

## 2020-09-19 DIAGNOSIS — M6281 Muscle weakness (generalized): Secondary | ICD-10-CM | POA: Diagnosis not present

## 2020-09-19 DIAGNOSIS — M24571 Contracture, right ankle: Secondary | ICD-10-CM | POA: Diagnosis not present

## 2020-09-19 DIAGNOSIS — G8111 Spastic hemiplegia affecting right dominant side: Secondary | ICD-10-CM | POA: Diagnosis not present

## 2020-09-19 DIAGNOSIS — I69352 Hemiplegia and hemiparesis following cerebral infarction affecting left dominant side: Secondary | ICD-10-CM | POA: Diagnosis not present

## 2020-09-19 DIAGNOSIS — M6259 Muscle wasting and atrophy, not elsewhere classified, multiple sites: Secondary | ICD-10-CM | POA: Diagnosis not present

## 2020-09-20 DIAGNOSIS — I69352 Hemiplegia and hemiparesis following cerebral infarction affecting left dominant side: Secondary | ICD-10-CM | POA: Diagnosis not present

## 2020-09-20 DIAGNOSIS — I69054 Hemiplegia and hemiparesis following nontraumatic subarachnoid hemorrhage affecting left non-dominant side: Secondary | ICD-10-CM | POA: Diagnosis not present

## 2020-09-20 DIAGNOSIS — M6259 Muscle wasting and atrophy, not elsewhere classified, multiple sites: Secondary | ICD-10-CM | POA: Diagnosis not present

## 2020-09-20 DIAGNOSIS — M6281 Muscle weakness (generalized): Secondary | ICD-10-CM | POA: Diagnosis not present

## 2020-09-20 DIAGNOSIS — G8111 Spastic hemiplegia affecting right dominant side: Secondary | ICD-10-CM | POA: Diagnosis not present

## 2020-09-20 DIAGNOSIS — M24571 Contracture, right ankle: Secondary | ICD-10-CM | POA: Diagnosis not present

## 2020-09-24 DIAGNOSIS — M6281 Muscle weakness (generalized): Secondary | ICD-10-CM | POA: Diagnosis not present

## 2020-09-24 DIAGNOSIS — M6259 Muscle wasting and atrophy, not elsewhere classified, multiple sites: Secondary | ICD-10-CM | POA: Diagnosis not present

## 2020-09-24 DIAGNOSIS — G8111 Spastic hemiplegia affecting right dominant side: Secondary | ICD-10-CM | POA: Diagnosis not present

## 2020-09-24 DIAGNOSIS — I69352 Hemiplegia and hemiparesis following cerebral infarction affecting left dominant side: Secondary | ICD-10-CM | POA: Diagnosis not present

## 2020-09-24 DIAGNOSIS — M24571 Contracture, right ankle: Secondary | ICD-10-CM | POA: Diagnosis not present

## 2020-09-24 DIAGNOSIS — I69054 Hemiplegia and hemiparesis following nontraumatic subarachnoid hemorrhage affecting left non-dominant side: Secondary | ICD-10-CM | POA: Diagnosis not present

## 2020-09-25 DIAGNOSIS — I69352 Hemiplegia and hemiparesis following cerebral infarction affecting left dominant side: Secondary | ICD-10-CM | POA: Diagnosis not present

## 2020-09-25 DIAGNOSIS — I69054 Hemiplegia and hemiparesis following nontraumatic subarachnoid hemorrhage affecting left non-dominant side: Secondary | ICD-10-CM | POA: Diagnosis not present

## 2020-09-25 DIAGNOSIS — M6281 Muscle weakness (generalized): Secondary | ICD-10-CM | POA: Diagnosis not present

## 2020-09-25 DIAGNOSIS — M6259 Muscle wasting and atrophy, not elsewhere classified, multiple sites: Secondary | ICD-10-CM | POA: Diagnosis not present

## 2020-09-25 DIAGNOSIS — G8111 Spastic hemiplegia affecting right dominant side: Secondary | ICD-10-CM | POA: Diagnosis not present

## 2020-09-25 DIAGNOSIS — M24571 Contracture, right ankle: Secondary | ICD-10-CM | POA: Diagnosis not present

## 2020-09-26 DIAGNOSIS — I69352 Hemiplegia and hemiparesis following cerebral infarction affecting left dominant side: Secondary | ICD-10-CM | POA: Diagnosis not present

## 2020-09-26 DIAGNOSIS — M6281 Muscle weakness (generalized): Secondary | ICD-10-CM | POA: Diagnosis not present

## 2020-09-26 DIAGNOSIS — G8111 Spastic hemiplegia affecting right dominant side: Secondary | ICD-10-CM | POA: Diagnosis not present

## 2020-09-26 DIAGNOSIS — M24571 Contracture, right ankle: Secondary | ICD-10-CM | POA: Diagnosis not present

## 2020-09-26 DIAGNOSIS — I69054 Hemiplegia and hemiparesis following nontraumatic subarachnoid hemorrhage affecting left non-dominant side: Secondary | ICD-10-CM | POA: Diagnosis not present

## 2020-09-26 DIAGNOSIS — M6259 Muscle wasting and atrophy, not elsewhere classified, multiple sites: Secondary | ICD-10-CM | POA: Diagnosis not present

## 2020-09-27 DIAGNOSIS — G8111 Spastic hemiplegia affecting right dominant side: Secondary | ICD-10-CM | POA: Diagnosis not present

## 2020-09-27 DIAGNOSIS — M6259 Muscle wasting and atrophy, not elsewhere classified, multiple sites: Secondary | ICD-10-CM | POA: Diagnosis not present

## 2020-09-27 DIAGNOSIS — M6281 Muscle weakness (generalized): Secondary | ICD-10-CM | POA: Diagnosis not present

## 2020-09-27 DIAGNOSIS — M24571 Contracture, right ankle: Secondary | ICD-10-CM | POA: Diagnosis not present

## 2020-09-27 DIAGNOSIS — I69054 Hemiplegia and hemiparesis following nontraumatic subarachnoid hemorrhage affecting left non-dominant side: Secondary | ICD-10-CM | POA: Diagnosis not present

## 2020-09-27 DIAGNOSIS — I69352 Hemiplegia and hemiparesis following cerebral infarction affecting left dominant side: Secondary | ICD-10-CM | POA: Diagnosis not present

## 2020-09-29 DIAGNOSIS — I69054 Hemiplegia and hemiparesis following nontraumatic subarachnoid hemorrhage affecting left non-dominant side: Secondary | ICD-10-CM | POA: Diagnosis not present

## 2020-09-29 DIAGNOSIS — G8111 Spastic hemiplegia affecting right dominant side: Secondary | ICD-10-CM | POA: Diagnosis not present

## 2020-09-29 DIAGNOSIS — I69352 Hemiplegia and hemiparesis following cerebral infarction affecting left dominant side: Secondary | ICD-10-CM | POA: Diagnosis not present

## 2020-09-29 DIAGNOSIS — M6259 Muscle wasting and atrophy, not elsewhere classified, multiple sites: Secondary | ICD-10-CM | POA: Diagnosis not present

## 2020-09-29 DIAGNOSIS — M6281 Muscle weakness (generalized): Secondary | ICD-10-CM | POA: Diagnosis not present

## 2020-09-29 DIAGNOSIS — M24571 Contracture, right ankle: Secondary | ICD-10-CM | POA: Diagnosis not present

## 2020-09-30 DIAGNOSIS — M6259 Muscle wasting and atrophy, not elsewhere classified, multiple sites: Secondary | ICD-10-CM | POA: Diagnosis not present

## 2020-09-30 DIAGNOSIS — M6281 Muscle weakness (generalized): Secondary | ICD-10-CM | POA: Diagnosis not present

## 2020-09-30 DIAGNOSIS — I69352 Hemiplegia and hemiparesis following cerebral infarction affecting left dominant side: Secondary | ICD-10-CM | POA: Diagnosis not present

## 2020-09-30 DIAGNOSIS — G8111 Spastic hemiplegia affecting right dominant side: Secondary | ICD-10-CM | POA: Diagnosis not present

## 2020-09-30 DIAGNOSIS — I69054 Hemiplegia and hemiparesis following nontraumatic subarachnoid hemorrhage affecting left non-dominant side: Secondary | ICD-10-CM | POA: Diagnosis not present

## 2020-09-30 DIAGNOSIS — M24571 Contracture, right ankle: Secondary | ICD-10-CM | POA: Diagnosis not present

## 2020-10-01 DIAGNOSIS — I69054 Hemiplegia and hemiparesis following nontraumatic subarachnoid hemorrhage affecting left non-dominant side: Secondary | ICD-10-CM | POA: Diagnosis not present

## 2020-10-01 DIAGNOSIS — M6259 Muscle wasting and atrophy, not elsewhere classified, multiple sites: Secondary | ICD-10-CM | POA: Diagnosis not present

## 2020-10-01 DIAGNOSIS — G8111 Spastic hemiplegia affecting right dominant side: Secondary | ICD-10-CM | POA: Diagnosis not present

## 2020-10-01 DIAGNOSIS — M6281 Muscle weakness (generalized): Secondary | ICD-10-CM | POA: Diagnosis not present

## 2020-10-01 DIAGNOSIS — I69352 Hemiplegia and hemiparesis following cerebral infarction affecting left dominant side: Secondary | ICD-10-CM | POA: Diagnosis not present

## 2020-10-01 DIAGNOSIS — M24571 Contracture, right ankle: Secondary | ICD-10-CM | POA: Diagnosis not present

## 2020-10-02 DIAGNOSIS — I69352 Hemiplegia and hemiparesis following cerebral infarction affecting left dominant side: Secondary | ICD-10-CM | POA: Diagnosis not present

## 2020-10-02 DIAGNOSIS — M6259 Muscle wasting and atrophy, not elsewhere classified, multiple sites: Secondary | ICD-10-CM | POA: Diagnosis not present

## 2020-10-02 DIAGNOSIS — G8111 Spastic hemiplegia affecting right dominant side: Secondary | ICD-10-CM | POA: Diagnosis not present

## 2020-10-02 DIAGNOSIS — I69054 Hemiplegia and hemiparesis following nontraumatic subarachnoid hemorrhage affecting left non-dominant side: Secondary | ICD-10-CM | POA: Diagnosis not present

## 2020-10-02 DIAGNOSIS — M6281 Muscle weakness (generalized): Secondary | ICD-10-CM | POA: Diagnosis not present

## 2020-10-02 DIAGNOSIS — M24571 Contracture, right ankle: Secondary | ICD-10-CM | POA: Diagnosis not present

## 2020-10-03 DIAGNOSIS — I69054 Hemiplegia and hemiparesis following nontraumatic subarachnoid hemorrhage affecting left non-dominant side: Secondary | ICD-10-CM | POA: Diagnosis not present

## 2020-10-03 DIAGNOSIS — G8111 Spastic hemiplegia affecting right dominant side: Secondary | ICD-10-CM | POA: Diagnosis not present

## 2020-10-03 DIAGNOSIS — M6281 Muscle weakness (generalized): Secondary | ICD-10-CM | POA: Diagnosis not present

## 2020-10-03 DIAGNOSIS — I69352 Hemiplegia and hemiparesis following cerebral infarction affecting left dominant side: Secondary | ICD-10-CM | POA: Diagnosis not present

## 2020-10-03 DIAGNOSIS — M6259 Muscle wasting and atrophy, not elsewhere classified, multiple sites: Secondary | ICD-10-CM | POA: Diagnosis not present

## 2020-10-03 DIAGNOSIS — M24571 Contracture, right ankle: Secondary | ICD-10-CM | POA: Diagnosis not present

## 2020-10-04 ENCOUNTER — Ambulatory Visit: Payer: Medicare Other | Admitting: Vascular Surgery

## 2020-10-04 DIAGNOSIS — G8111 Spastic hemiplegia affecting right dominant side: Secondary | ICD-10-CM | POA: Diagnosis not present

## 2020-10-04 DIAGNOSIS — I69054 Hemiplegia and hemiparesis following nontraumatic subarachnoid hemorrhage affecting left non-dominant side: Secondary | ICD-10-CM | POA: Diagnosis not present

## 2020-10-04 DIAGNOSIS — M6281 Muscle weakness (generalized): Secondary | ICD-10-CM | POA: Diagnosis not present

## 2020-10-04 DIAGNOSIS — M24571 Contracture, right ankle: Secondary | ICD-10-CM | POA: Diagnosis not present

## 2020-10-04 DIAGNOSIS — M6259 Muscle wasting and atrophy, not elsewhere classified, multiple sites: Secondary | ICD-10-CM | POA: Diagnosis not present

## 2020-10-04 DIAGNOSIS — I69352 Hemiplegia and hemiparesis following cerebral infarction affecting left dominant side: Secondary | ICD-10-CM | POA: Diagnosis not present

## 2020-10-05 DIAGNOSIS — I69054 Hemiplegia and hemiparesis following nontraumatic subarachnoid hemorrhage affecting left non-dominant side: Secondary | ICD-10-CM | POA: Diagnosis not present

## 2020-10-05 DIAGNOSIS — G8111 Spastic hemiplegia affecting right dominant side: Secondary | ICD-10-CM | POA: Diagnosis not present

## 2020-10-05 DIAGNOSIS — I69352 Hemiplegia and hemiparesis following cerebral infarction affecting left dominant side: Secondary | ICD-10-CM | POA: Diagnosis not present

## 2020-10-05 DIAGNOSIS — M6259 Muscle wasting and atrophy, not elsewhere classified, multiple sites: Secondary | ICD-10-CM | POA: Diagnosis not present

## 2020-10-05 DIAGNOSIS — M24571 Contracture, right ankle: Secondary | ICD-10-CM | POA: Diagnosis not present

## 2020-10-05 DIAGNOSIS — M6281 Muscle weakness (generalized): Secondary | ICD-10-CM | POA: Diagnosis not present

## 2020-10-06 DIAGNOSIS — M6259 Muscle wasting and atrophy, not elsewhere classified, multiple sites: Secondary | ICD-10-CM | POA: Diagnosis not present

## 2020-10-06 DIAGNOSIS — G8111 Spastic hemiplegia affecting right dominant side: Secondary | ICD-10-CM | POA: Diagnosis not present

## 2020-10-06 DIAGNOSIS — M24571 Contracture, right ankle: Secondary | ICD-10-CM | POA: Diagnosis not present

## 2020-10-06 DIAGNOSIS — M6281 Muscle weakness (generalized): Secondary | ICD-10-CM | POA: Diagnosis not present

## 2020-10-06 DIAGNOSIS — I69352 Hemiplegia and hemiparesis following cerebral infarction affecting left dominant side: Secondary | ICD-10-CM | POA: Diagnosis not present

## 2020-10-06 DIAGNOSIS — I69054 Hemiplegia and hemiparesis following nontraumatic subarachnoid hemorrhage affecting left non-dominant side: Secondary | ICD-10-CM | POA: Diagnosis not present

## 2020-10-07 DIAGNOSIS — I69352 Hemiplegia and hemiparesis following cerebral infarction affecting left dominant side: Secondary | ICD-10-CM | POA: Diagnosis not present

## 2020-10-07 DIAGNOSIS — G8111 Spastic hemiplegia affecting right dominant side: Secondary | ICD-10-CM | POA: Diagnosis not present

## 2020-10-07 DIAGNOSIS — I69054 Hemiplegia and hemiparesis following nontraumatic subarachnoid hemorrhage affecting left non-dominant side: Secondary | ICD-10-CM | POA: Diagnosis not present

## 2020-10-07 DIAGNOSIS — M6281 Muscle weakness (generalized): Secondary | ICD-10-CM | POA: Diagnosis not present

## 2020-10-07 DIAGNOSIS — M6259 Muscle wasting and atrophy, not elsewhere classified, multiple sites: Secondary | ICD-10-CM | POA: Diagnosis not present

## 2020-10-07 DIAGNOSIS — M24571 Contracture, right ankle: Secondary | ICD-10-CM | POA: Diagnosis not present

## 2020-10-08 DIAGNOSIS — M6259 Muscle wasting and atrophy, not elsewhere classified, multiple sites: Secondary | ICD-10-CM | POA: Diagnosis not present

## 2020-10-08 DIAGNOSIS — I69352 Hemiplegia and hemiparesis following cerebral infarction affecting left dominant side: Secondary | ICD-10-CM | POA: Diagnosis not present

## 2020-10-08 DIAGNOSIS — G8111 Spastic hemiplegia affecting right dominant side: Secondary | ICD-10-CM | POA: Diagnosis not present

## 2020-10-08 DIAGNOSIS — M6281 Muscle weakness (generalized): Secondary | ICD-10-CM | POA: Diagnosis not present

## 2020-10-08 DIAGNOSIS — M24571 Contracture, right ankle: Secondary | ICD-10-CM | POA: Diagnosis not present

## 2020-10-08 DIAGNOSIS — I69054 Hemiplegia and hemiparesis following nontraumatic subarachnoid hemorrhage affecting left non-dominant side: Secondary | ICD-10-CM | POA: Diagnosis not present

## 2020-10-09 DIAGNOSIS — G8111 Spastic hemiplegia affecting right dominant side: Secondary | ICD-10-CM | POA: Diagnosis not present

## 2020-10-09 DIAGNOSIS — M24572 Contracture, left ankle: Secondary | ICD-10-CM | POA: Diagnosis not present

## 2020-10-09 DIAGNOSIS — R296 Repeated falls: Secondary | ICD-10-CM | POA: Diagnosis not present

## 2020-10-09 DIAGNOSIS — I2699 Other pulmonary embolism without acute cor pulmonale: Secondary | ICD-10-CM | POA: Diagnosis not present

## 2020-10-09 DIAGNOSIS — M24571 Contracture, right ankle: Secondary | ICD-10-CM | POA: Diagnosis not present

## 2020-10-09 DIAGNOSIS — E46 Unspecified protein-calorie malnutrition: Secondary | ICD-10-CM | POA: Diagnosis not present

## 2020-10-09 DIAGNOSIS — R1311 Dysphagia, oral phase: Secondary | ICD-10-CM | POA: Diagnosis not present

## 2020-10-09 DIAGNOSIS — I69352 Hemiplegia and hemiparesis following cerebral infarction affecting left dominant side: Secondary | ICD-10-CM | POA: Diagnosis not present

## 2020-10-09 DIAGNOSIS — R278 Other lack of coordination: Secondary | ICD-10-CM | POA: Diagnosis not present

## 2020-10-09 DIAGNOSIS — I69391 Dysphagia following cerebral infarction: Secondary | ICD-10-CM | POA: Diagnosis not present

## 2020-10-09 DIAGNOSIS — E1169 Type 2 diabetes mellitus with other specified complication: Secondary | ICD-10-CM | POA: Diagnosis not present

## 2020-10-09 DIAGNOSIS — R4701 Aphasia: Secondary | ICD-10-CM | POA: Diagnosis not present

## 2020-10-09 DIAGNOSIS — I69054 Hemiplegia and hemiparesis following nontraumatic subarachnoid hemorrhage affecting left non-dominant side: Secondary | ICD-10-CM | POA: Diagnosis not present

## 2020-10-09 DIAGNOSIS — R1312 Dysphagia, oropharyngeal phase: Secondary | ICD-10-CM | POA: Diagnosis not present

## 2020-10-09 DIAGNOSIS — M6259 Muscle wasting and atrophy, not elsewhere classified, multiple sites: Secondary | ICD-10-CM | POA: Diagnosis not present

## 2020-10-09 DIAGNOSIS — M6281 Muscle weakness (generalized): Secondary | ICD-10-CM | POA: Diagnosis not present

## 2020-10-10 DIAGNOSIS — I69054 Hemiplegia and hemiparesis following nontraumatic subarachnoid hemorrhage affecting left non-dominant side: Secondary | ICD-10-CM | POA: Diagnosis not present

## 2020-10-10 DIAGNOSIS — I69352 Hemiplegia and hemiparesis following cerebral infarction affecting left dominant side: Secondary | ICD-10-CM | POA: Diagnosis not present

## 2020-10-10 DIAGNOSIS — M6259 Muscle wasting and atrophy, not elsewhere classified, multiple sites: Secondary | ICD-10-CM | POA: Diagnosis not present

## 2020-10-10 DIAGNOSIS — M24571 Contracture, right ankle: Secondary | ICD-10-CM | POA: Diagnosis not present

## 2020-10-10 DIAGNOSIS — G8111 Spastic hemiplegia affecting right dominant side: Secondary | ICD-10-CM | POA: Diagnosis not present

## 2020-10-10 DIAGNOSIS — M6281 Muscle weakness (generalized): Secondary | ICD-10-CM | POA: Diagnosis not present

## 2020-10-11 DIAGNOSIS — M6259 Muscle wasting and atrophy, not elsewhere classified, multiple sites: Secondary | ICD-10-CM | POA: Diagnosis not present

## 2020-10-11 DIAGNOSIS — G8111 Spastic hemiplegia affecting right dominant side: Secondary | ICD-10-CM | POA: Diagnosis not present

## 2020-10-11 DIAGNOSIS — I69054 Hemiplegia and hemiparesis following nontraumatic subarachnoid hemorrhage affecting left non-dominant side: Secondary | ICD-10-CM | POA: Diagnosis not present

## 2020-10-11 DIAGNOSIS — I69352 Hemiplegia and hemiparesis following cerebral infarction affecting left dominant side: Secondary | ICD-10-CM | POA: Diagnosis not present

## 2020-10-11 DIAGNOSIS — M6281 Muscle weakness (generalized): Secondary | ICD-10-CM | POA: Diagnosis not present

## 2020-10-11 DIAGNOSIS — M24571 Contracture, right ankle: Secondary | ICD-10-CM | POA: Diagnosis not present

## 2020-10-15 DIAGNOSIS — F338 Other recurrent depressive disorders: Secondary | ICD-10-CM | POA: Diagnosis not present

## 2020-10-15 DIAGNOSIS — M6259 Muscle wasting and atrophy, not elsewhere classified, multiple sites: Secondary | ICD-10-CM | POA: Diagnosis not present

## 2020-10-15 DIAGNOSIS — G4700 Insomnia, unspecified: Secondary | ICD-10-CM | POA: Diagnosis not present

## 2020-10-15 DIAGNOSIS — G8111 Spastic hemiplegia affecting right dominant side: Secondary | ICD-10-CM | POA: Diagnosis not present

## 2020-10-15 DIAGNOSIS — I69054 Hemiplegia and hemiparesis following nontraumatic subarachnoid hemorrhage affecting left non-dominant side: Secondary | ICD-10-CM | POA: Diagnosis not present

## 2020-10-15 DIAGNOSIS — M6281 Muscle weakness (generalized): Secondary | ICD-10-CM | POA: Diagnosis not present

## 2020-10-15 DIAGNOSIS — I69352 Hemiplegia and hemiparesis following cerebral infarction affecting left dominant side: Secondary | ICD-10-CM | POA: Diagnosis not present

## 2020-10-15 DIAGNOSIS — M24571 Contracture, right ankle: Secondary | ICD-10-CM | POA: Diagnosis not present

## 2020-10-16 DIAGNOSIS — M6281 Muscle weakness (generalized): Secondary | ICD-10-CM | POA: Diagnosis not present

## 2020-10-16 DIAGNOSIS — M24571 Contracture, right ankle: Secondary | ICD-10-CM | POA: Diagnosis not present

## 2020-10-16 DIAGNOSIS — I69054 Hemiplegia and hemiparesis following nontraumatic subarachnoid hemorrhage affecting left non-dominant side: Secondary | ICD-10-CM | POA: Diagnosis not present

## 2020-10-16 DIAGNOSIS — M6259 Muscle wasting and atrophy, not elsewhere classified, multiple sites: Secondary | ICD-10-CM | POA: Diagnosis not present

## 2020-10-16 DIAGNOSIS — G8111 Spastic hemiplegia affecting right dominant side: Secondary | ICD-10-CM | POA: Diagnosis not present

## 2020-10-16 DIAGNOSIS — I69352 Hemiplegia and hemiparesis following cerebral infarction affecting left dominant side: Secondary | ICD-10-CM | POA: Diagnosis not present

## 2020-10-18 DIAGNOSIS — I69352 Hemiplegia and hemiparesis following cerebral infarction affecting left dominant side: Secondary | ICD-10-CM | POA: Diagnosis not present

## 2020-10-18 DIAGNOSIS — M24571 Contracture, right ankle: Secondary | ICD-10-CM | POA: Diagnosis not present

## 2020-10-18 DIAGNOSIS — G8111 Spastic hemiplegia affecting right dominant side: Secondary | ICD-10-CM | POA: Diagnosis not present

## 2020-10-18 DIAGNOSIS — M6281 Muscle weakness (generalized): Secondary | ICD-10-CM | POA: Diagnosis not present

## 2020-10-18 DIAGNOSIS — M6259 Muscle wasting and atrophy, not elsewhere classified, multiple sites: Secondary | ICD-10-CM | POA: Diagnosis not present

## 2020-10-18 DIAGNOSIS — I69054 Hemiplegia and hemiparesis following nontraumatic subarachnoid hemorrhage affecting left non-dominant side: Secondary | ICD-10-CM | POA: Diagnosis not present

## 2020-10-19 DIAGNOSIS — G8111 Spastic hemiplegia affecting right dominant side: Secondary | ICD-10-CM | POA: Diagnosis not present

## 2020-10-19 DIAGNOSIS — I69054 Hemiplegia and hemiparesis following nontraumatic subarachnoid hemorrhage affecting left non-dominant side: Secondary | ICD-10-CM | POA: Diagnosis not present

## 2020-10-19 DIAGNOSIS — M6259 Muscle wasting and atrophy, not elsewhere classified, multiple sites: Secondary | ICD-10-CM | POA: Diagnosis not present

## 2020-10-19 DIAGNOSIS — M6281 Muscle weakness (generalized): Secondary | ICD-10-CM | POA: Diagnosis not present

## 2020-10-19 DIAGNOSIS — I69352 Hemiplegia and hemiparesis following cerebral infarction affecting left dominant side: Secondary | ICD-10-CM | POA: Diagnosis not present

## 2020-10-19 DIAGNOSIS — M24571 Contracture, right ankle: Secondary | ICD-10-CM | POA: Diagnosis not present

## 2020-10-22 DIAGNOSIS — M24571 Contracture, right ankle: Secondary | ICD-10-CM | POA: Diagnosis not present

## 2020-10-22 DIAGNOSIS — I69054 Hemiplegia and hemiparesis following nontraumatic subarachnoid hemorrhage affecting left non-dominant side: Secondary | ICD-10-CM | POA: Diagnosis not present

## 2020-10-22 DIAGNOSIS — M6281 Muscle weakness (generalized): Secondary | ICD-10-CM | POA: Diagnosis not present

## 2020-10-22 DIAGNOSIS — G8111 Spastic hemiplegia affecting right dominant side: Secondary | ICD-10-CM | POA: Diagnosis not present

## 2020-10-22 DIAGNOSIS — I69352 Hemiplegia and hemiparesis following cerebral infarction affecting left dominant side: Secondary | ICD-10-CM | POA: Diagnosis not present

## 2020-10-22 DIAGNOSIS — M6259 Muscle wasting and atrophy, not elsewhere classified, multiple sites: Secondary | ICD-10-CM | POA: Diagnosis not present

## 2020-10-23 DIAGNOSIS — M6259 Muscle wasting and atrophy, not elsewhere classified, multiple sites: Secondary | ICD-10-CM | POA: Diagnosis not present

## 2020-10-23 DIAGNOSIS — M6281 Muscle weakness (generalized): Secondary | ICD-10-CM | POA: Diagnosis not present

## 2020-10-23 DIAGNOSIS — I69054 Hemiplegia and hemiparesis following nontraumatic subarachnoid hemorrhage affecting left non-dominant side: Secondary | ICD-10-CM | POA: Diagnosis not present

## 2020-10-23 DIAGNOSIS — I69352 Hemiplegia and hemiparesis following cerebral infarction affecting left dominant side: Secondary | ICD-10-CM | POA: Diagnosis not present

## 2020-10-23 DIAGNOSIS — G8111 Spastic hemiplegia affecting right dominant side: Secondary | ICD-10-CM | POA: Diagnosis not present

## 2020-10-23 DIAGNOSIS — M24571 Contracture, right ankle: Secondary | ICD-10-CM | POA: Diagnosis not present

## 2020-10-26 DIAGNOSIS — I69352 Hemiplegia and hemiparesis following cerebral infarction affecting left dominant side: Secondary | ICD-10-CM | POA: Diagnosis not present

## 2020-10-26 DIAGNOSIS — I69054 Hemiplegia and hemiparesis following nontraumatic subarachnoid hemorrhage affecting left non-dominant side: Secondary | ICD-10-CM | POA: Diagnosis not present

## 2020-10-26 DIAGNOSIS — G8111 Spastic hemiplegia affecting right dominant side: Secondary | ICD-10-CM | POA: Diagnosis not present

## 2020-10-26 DIAGNOSIS — M6259 Muscle wasting and atrophy, not elsewhere classified, multiple sites: Secondary | ICD-10-CM | POA: Diagnosis not present

## 2020-10-26 DIAGNOSIS — M6281 Muscle weakness (generalized): Secondary | ICD-10-CM | POA: Diagnosis not present

## 2020-10-26 DIAGNOSIS — M24571 Contracture, right ankle: Secondary | ICD-10-CM | POA: Diagnosis not present

## 2020-10-29 DIAGNOSIS — I69352 Hemiplegia and hemiparesis following cerebral infarction affecting left dominant side: Secondary | ICD-10-CM | POA: Diagnosis not present

## 2020-10-29 DIAGNOSIS — G8111 Spastic hemiplegia affecting right dominant side: Secondary | ICD-10-CM | POA: Diagnosis not present

## 2020-10-29 DIAGNOSIS — M24571 Contracture, right ankle: Secondary | ICD-10-CM | POA: Diagnosis not present

## 2020-10-29 DIAGNOSIS — M6281 Muscle weakness (generalized): Secondary | ICD-10-CM | POA: Diagnosis not present

## 2020-10-29 DIAGNOSIS — M6259 Muscle wasting and atrophy, not elsewhere classified, multiple sites: Secondary | ICD-10-CM | POA: Diagnosis not present

## 2020-10-29 DIAGNOSIS — I69054 Hemiplegia and hemiparesis following nontraumatic subarachnoid hemorrhage affecting left non-dominant side: Secondary | ICD-10-CM | POA: Diagnosis not present

## 2020-10-30 DIAGNOSIS — M24571 Contracture, right ankle: Secondary | ICD-10-CM | POA: Diagnosis not present

## 2020-10-30 DIAGNOSIS — I69352 Hemiplegia and hemiparesis following cerebral infarction affecting left dominant side: Secondary | ICD-10-CM | POA: Diagnosis not present

## 2020-10-30 DIAGNOSIS — M6281 Muscle weakness (generalized): Secondary | ICD-10-CM | POA: Diagnosis not present

## 2020-10-30 DIAGNOSIS — M6259 Muscle wasting and atrophy, not elsewhere classified, multiple sites: Secondary | ICD-10-CM | POA: Diagnosis not present

## 2020-10-30 DIAGNOSIS — I69054 Hemiplegia and hemiparesis following nontraumatic subarachnoid hemorrhage affecting left non-dominant side: Secondary | ICD-10-CM | POA: Diagnosis not present

## 2020-10-30 DIAGNOSIS — G8111 Spastic hemiplegia affecting right dominant side: Secondary | ICD-10-CM | POA: Diagnosis not present

## 2020-11-02 DIAGNOSIS — M6259 Muscle wasting and atrophy, not elsewhere classified, multiple sites: Secondary | ICD-10-CM | POA: Diagnosis not present

## 2020-11-02 DIAGNOSIS — I69054 Hemiplegia and hemiparesis following nontraumatic subarachnoid hemorrhage affecting left non-dominant side: Secondary | ICD-10-CM | POA: Diagnosis not present

## 2020-11-02 DIAGNOSIS — M24571 Contracture, right ankle: Secondary | ICD-10-CM | POA: Diagnosis not present

## 2020-11-02 DIAGNOSIS — M6281 Muscle weakness (generalized): Secondary | ICD-10-CM | POA: Diagnosis not present

## 2020-11-02 DIAGNOSIS — I69352 Hemiplegia and hemiparesis following cerebral infarction affecting left dominant side: Secondary | ICD-10-CM | POA: Diagnosis not present

## 2020-11-02 DIAGNOSIS — G8111 Spastic hemiplegia affecting right dominant side: Secondary | ICD-10-CM | POA: Diagnosis not present

## 2020-11-03 DIAGNOSIS — I69352 Hemiplegia and hemiparesis following cerebral infarction affecting left dominant side: Secondary | ICD-10-CM | POA: Diagnosis not present

## 2020-11-03 DIAGNOSIS — M24571 Contracture, right ankle: Secondary | ICD-10-CM | POA: Diagnosis not present

## 2020-11-03 DIAGNOSIS — I69054 Hemiplegia and hemiparesis following nontraumatic subarachnoid hemorrhage affecting left non-dominant side: Secondary | ICD-10-CM | POA: Diagnosis not present

## 2020-11-03 DIAGNOSIS — M6259 Muscle wasting and atrophy, not elsewhere classified, multiple sites: Secondary | ICD-10-CM | POA: Diagnosis not present

## 2020-11-03 DIAGNOSIS — M6281 Muscle weakness (generalized): Secondary | ICD-10-CM | POA: Diagnosis not present

## 2020-11-03 DIAGNOSIS — G8111 Spastic hemiplegia affecting right dominant side: Secondary | ICD-10-CM | POA: Diagnosis not present

## 2020-11-06 DIAGNOSIS — G8111 Spastic hemiplegia affecting right dominant side: Secondary | ICD-10-CM | POA: Diagnosis not present

## 2020-11-06 DIAGNOSIS — I69054 Hemiplegia and hemiparesis following nontraumatic subarachnoid hemorrhage affecting left non-dominant side: Secondary | ICD-10-CM | POA: Diagnosis not present

## 2020-11-06 DIAGNOSIS — M24571 Contracture, right ankle: Secondary | ICD-10-CM | POA: Diagnosis not present

## 2020-11-06 DIAGNOSIS — I69352 Hemiplegia and hemiparesis following cerebral infarction affecting left dominant side: Secondary | ICD-10-CM | POA: Diagnosis not present

## 2020-11-06 DIAGNOSIS — M6259 Muscle wasting and atrophy, not elsewhere classified, multiple sites: Secondary | ICD-10-CM | POA: Diagnosis not present

## 2020-11-06 DIAGNOSIS — M6281 Muscle weakness (generalized): Secondary | ICD-10-CM | POA: Diagnosis not present

## 2020-11-07 DIAGNOSIS — I69352 Hemiplegia and hemiparesis following cerebral infarction affecting left dominant side: Secondary | ICD-10-CM | POA: Diagnosis not present

## 2020-11-07 DIAGNOSIS — G4700 Insomnia, unspecified: Secondary | ICD-10-CM | POA: Diagnosis not present

## 2020-11-07 DIAGNOSIS — M24571 Contracture, right ankle: Secondary | ICD-10-CM | POA: Diagnosis not present

## 2020-11-07 DIAGNOSIS — I69054 Hemiplegia and hemiparesis following nontraumatic subarachnoid hemorrhage affecting left non-dominant side: Secondary | ICD-10-CM | POA: Diagnosis not present

## 2020-11-07 DIAGNOSIS — M6259 Muscle wasting and atrophy, not elsewhere classified, multiple sites: Secondary | ICD-10-CM | POA: Diagnosis not present

## 2020-11-07 DIAGNOSIS — G8111 Spastic hemiplegia affecting right dominant side: Secondary | ICD-10-CM | POA: Diagnosis not present

## 2020-11-07 DIAGNOSIS — M6281 Muscle weakness (generalized): Secondary | ICD-10-CM | POA: Diagnosis not present

## 2020-11-08 DIAGNOSIS — M6259 Muscle wasting and atrophy, not elsewhere classified, multiple sites: Secondary | ICD-10-CM | POA: Diagnosis not present

## 2020-11-08 DIAGNOSIS — M24571 Contracture, right ankle: Secondary | ICD-10-CM | POA: Diagnosis not present

## 2020-11-08 DIAGNOSIS — I69054 Hemiplegia and hemiparesis following nontraumatic subarachnoid hemorrhage affecting left non-dominant side: Secondary | ICD-10-CM | POA: Diagnosis not present

## 2020-11-08 DIAGNOSIS — I69352 Hemiplegia and hemiparesis following cerebral infarction affecting left dominant side: Secondary | ICD-10-CM | POA: Diagnosis not present

## 2020-11-08 DIAGNOSIS — M6281 Muscle weakness (generalized): Secondary | ICD-10-CM | POA: Diagnosis not present

## 2020-11-08 DIAGNOSIS — G8111 Spastic hemiplegia affecting right dominant side: Secondary | ICD-10-CM | POA: Diagnosis not present

## 2020-11-12 DIAGNOSIS — I69054 Hemiplegia and hemiparesis following nontraumatic subarachnoid hemorrhage affecting left non-dominant side: Secondary | ICD-10-CM | POA: Diagnosis not present

## 2020-11-12 DIAGNOSIS — M24572 Contracture, left ankle: Secondary | ICD-10-CM | POA: Diagnosis not present

## 2020-11-12 DIAGNOSIS — R1311 Dysphagia, oral phase: Secondary | ICD-10-CM | POA: Diagnosis not present

## 2020-11-12 DIAGNOSIS — R4701 Aphasia: Secondary | ICD-10-CM | POA: Diagnosis not present

## 2020-11-12 DIAGNOSIS — M24571 Contracture, right ankle: Secondary | ICD-10-CM | POA: Diagnosis not present

## 2020-11-12 DIAGNOSIS — R296 Repeated falls: Secondary | ICD-10-CM | POA: Diagnosis not present

## 2020-11-12 DIAGNOSIS — R1312 Dysphagia, oropharyngeal phase: Secondary | ICD-10-CM | POA: Diagnosis not present

## 2020-11-12 DIAGNOSIS — M6259 Muscle wasting and atrophy, not elsewhere classified, multiple sites: Secondary | ICD-10-CM | POA: Diagnosis not present

## 2020-11-12 DIAGNOSIS — M6281 Muscle weakness (generalized): Secondary | ICD-10-CM | POA: Diagnosis not present

## 2020-11-12 DIAGNOSIS — I69391 Dysphagia following cerebral infarction: Secondary | ICD-10-CM | POA: Diagnosis not present

## 2020-11-12 DIAGNOSIS — G8111 Spastic hemiplegia affecting right dominant side: Secondary | ICD-10-CM | POA: Diagnosis not present

## 2020-11-12 DIAGNOSIS — I69352 Hemiplegia and hemiparesis following cerebral infarction affecting left dominant side: Secondary | ICD-10-CM | POA: Diagnosis not present

## 2020-11-12 DIAGNOSIS — R278 Other lack of coordination: Secondary | ICD-10-CM | POA: Diagnosis not present

## 2020-11-13 DIAGNOSIS — M6259 Muscle wasting and atrophy, not elsewhere classified, multiple sites: Secondary | ICD-10-CM | POA: Diagnosis not present

## 2020-11-13 DIAGNOSIS — I69054 Hemiplegia and hemiparesis following nontraumatic subarachnoid hemorrhage affecting left non-dominant side: Secondary | ICD-10-CM | POA: Diagnosis not present

## 2020-11-13 DIAGNOSIS — G4700 Insomnia, unspecified: Secondary | ICD-10-CM | POA: Diagnosis not present

## 2020-11-13 DIAGNOSIS — I69352 Hemiplegia and hemiparesis following cerebral infarction affecting left dominant side: Secondary | ICD-10-CM | POA: Diagnosis not present

## 2020-11-13 DIAGNOSIS — M24571 Contracture, right ankle: Secondary | ICD-10-CM | POA: Diagnosis not present

## 2020-11-13 DIAGNOSIS — M6281 Muscle weakness (generalized): Secondary | ICD-10-CM | POA: Diagnosis not present

## 2020-11-13 DIAGNOSIS — G8111 Spastic hemiplegia affecting right dominant side: Secondary | ICD-10-CM | POA: Diagnosis not present

## 2020-11-13 DIAGNOSIS — I771 Stricture of artery: Secondary | ICD-10-CM | POA: Diagnosis not present

## 2020-11-13 DIAGNOSIS — I712 Thoracic aortic aneurysm, without rupture: Secondary | ICD-10-CM | POA: Diagnosis not present

## 2020-11-15 DIAGNOSIS — M6259 Muscle wasting and atrophy, not elsewhere classified, multiple sites: Secondary | ICD-10-CM | POA: Diagnosis not present

## 2020-11-15 DIAGNOSIS — I69054 Hemiplegia and hemiparesis following nontraumatic subarachnoid hemorrhage affecting left non-dominant side: Secondary | ICD-10-CM | POA: Diagnosis not present

## 2020-11-15 DIAGNOSIS — G8111 Spastic hemiplegia affecting right dominant side: Secondary | ICD-10-CM | POA: Diagnosis not present

## 2020-11-15 DIAGNOSIS — M6281 Muscle weakness (generalized): Secondary | ICD-10-CM | POA: Diagnosis not present

## 2020-11-15 DIAGNOSIS — I69352 Hemiplegia and hemiparesis following cerebral infarction affecting left dominant side: Secondary | ICD-10-CM | POA: Diagnosis not present

## 2020-11-15 DIAGNOSIS — M24571 Contracture, right ankle: Secondary | ICD-10-CM | POA: Diagnosis not present

## 2020-11-16 DIAGNOSIS — M6281 Muscle weakness (generalized): Secondary | ICD-10-CM | POA: Diagnosis not present

## 2020-11-16 DIAGNOSIS — G8111 Spastic hemiplegia affecting right dominant side: Secondary | ICD-10-CM | POA: Diagnosis not present

## 2020-11-16 DIAGNOSIS — M6259 Muscle wasting and atrophy, not elsewhere classified, multiple sites: Secondary | ICD-10-CM | POA: Diagnosis not present

## 2020-11-16 DIAGNOSIS — M24571 Contracture, right ankle: Secondary | ICD-10-CM | POA: Diagnosis not present

## 2020-11-16 DIAGNOSIS — I69352 Hemiplegia and hemiparesis following cerebral infarction affecting left dominant side: Secondary | ICD-10-CM | POA: Diagnosis not present

## 2020-11-16 DIAGNOSIS — I69054 Hemiplegia and hemiparesis following nontraumatic subarachnoid hemorrhage affecting left non-dominant side: Secondary | ICD-10-CM | POA: Diagnosis not present

## 2020-11-19 DIAGNOSIS — I69352 Hemiplegia and hemiparesis following cerebral infarction affecting left dominant side: Secondary | ICD-10-CM | POA: Diagnosis not present

## 2020-11-19 DIAGNOSIS — M6259 Muscle wasting and atrophy, not elsewhere classified, multiple sites: Secondary | ICD-10-CM | POA: Diagnosis not present

## 2020-11-19 DIAGNOSIS — M6281 Muscle weakness (generalized): Secondary | ICD-10-CM | POA: Diagnosis not present

## 2020-11-19 DIAGNOSIS — G4700 Insomnia, unspecified: Secondary | ICD-10-CM | POA: Diagnosis not present

## 2020-11-19 DIAGNOSIS — M24571 Contracture, right ankle: Secondary | ICD-10-CM | POA: Diagnosis not present

## 2020-11-19 DIAGNOSIS — G8111 Spastic hemiplegia affecting right dominant side: Secondary | ICD-10-CM | POA: Diagnosis not present

## 2020-11-19 DIAGNOSIS — F338 Other recurrent depressive disorders: Secondary | ICD-10-CM | POA: Diagnosis not present

## 2020-11-19 DIAGNOSIS — I69054 Hemiplegia and hemiparesis following nontraumatic subarachnoid hemorrhage affecting left non-dominant side: Secondary | ICD-10-CM | POA: Diagnosis not present

## 2020-11-21 DIAGNOSIS — M24571 Contracture, right ankle: Secondary | ICD-10-CM | POA: Diagnosis not present

## 2020-11-21 DIAGNOSIS — I69352 Hemiplegia and hemiparesis following cerebral infarction affecting left dominant side: Secondary | ICD-10-CM | POA: Diagnosis not present

## 2020-11-21 DIAGNOSIS — I69054 Hemiplegia and hemiparesis following nontraumatic subarachnoid hemorrhage affecting left non-dominant side: Secondary | ICD-10-CM | POA: Diagnosis not present

## 2020-11-21 DIAGNOSIS — M6259 Muscle wasting and atrophy, not elsewhere classified, multiple sites: Secondary | ICD-10-CM | POA: Diagnosis not present

## 2020-11-21 DIAGNOSIS — G8111 Spastic hemiplegia affecting right dominant side: Secondary | ICD-10-CM | POA: Diagnosis not present

## 2020-11-21 DIAGNOSIS — M6281 Muscle weakness (generalized): Secondary | ICD-10-CM | POA: Diagnosis not present

## 2020-11-22 DIAGNOSIS — I69352 Hemiplegia and hemiparesis following cerebral infarction affecting left dominant side: Secondary | ICD-10-CM | POA: Diagnosis not present

## 2020-11-22 DIAGNOSIS — M6259 Muscle wasting and atrophy, not elsewhere classified, multiple sites: Secondary | ICD-10-CM | POA: Diagnosis not present

## 2020-11-22 DIAGNOSIS — M24571 Contracture, right ankle: Secondary | ICD-10-CM | POA: Diagnosis not present

## 2020-11-22 DIAGNOSIS — G8111 Spastic hemiplegia affecting right dominant side: Secondary | ICD-10-CM | POA: Diagnosis not present

## 2020-11-22 DIAGNOSIS — M6281 Muscle weakness (generalized): Secondary | ICD-10-CM | POA: Diagnosis not present

## 2020-11-22 DIAGNOSIS — I69054 Hemiplegia and hemiparesis following nontraumatic subarachnoid hemorrhage affecting left non-dominant side: Secondary | ICD-10-CM | POA: Diagnosis not present

## 2020-11-23 DIAGNOSIS — I69352 Hemiplegia and hemiparesis following cerebral infarction affecting left dominant side: Secondary | ICD-10-CM | POA: Diagnosis not present

## 2020-11-23 DIAGNOSIS — G8111 Spastic hemiplegia affecting right dominant side: Secondary | ICD-10-CM | POA: Diagnosis not present

## 2020-11-23 DIAGNOSIS — M24571 Contracture, right ankle: Secondary | ICD-10-CM | POA: Diagnosis not present

## 2020-11-23 DIAGNOSIS — I69054 Hemiplegia and hemiparesis following nontraumatic subarachnoid hemorrhage affecting left non-dominant side: Secondary | ICD-10-CM | POA: Diagnosis not present

## 2020-11-23 DIAGNOSIS — M6281 Muscle weakness (generalized): Secondary | ICD-10-CM | POA: Diagnosis not present

## 2020-11-23 DIAGNOSIS — M6259 Muscle wasting and atrophy, not elsewhere classified, multiple sites: Secondary | ICD-10-CM | POA: Diagnosis not present

## 2020-11-26 DIAGNOSIS — M24571 Contracture, right ankle: Secondary | ICD-10-CM | POA: Diagnosis not present

## 2020-11-26 DIAGNOSIS — G8111 Spastic hemiplegia affecting right dominant side: Secondary | ICD-10-CM | POA: Diagnosis not present

## 2020-11-26 DIAGNOSIS — I69054 Hemiplegia and hemiparesis following nontraumatic subarachnoid hemorrhage affecting left non-dominant side: Secondary | ICD-10-CM | POA: Diagnosis not present

## 2020-11-26 DIAGNOSIS — M6281 Muscle weakness (generalized): Secondary | ICD-10-CM | POA: Diagnosis not present

## 2020-11-26 DIAGNOSIS — M6259 Muscle wasting and atrophy, not elsewhere classified, multiple sites: Secondary | ICD-10-CM | POA: Diagnosis not present

## 2020-11-26 DIAGNOSIS — I69352 Hemiplegia and hemiparesis following cerebral infarction affecting left dominant side: Secondary | ICD-10-CM | POA: Diagnosis not present

## 2020-11-28 DIAGNOSIS — E559 Vitamin D deficiency, unspecified: Secondary | ICD-10-CM | POA: Diagnosis not present

## 2020-11-28 DIAGNOSIS — M6281 Muscle weakness (generalized): Secondary | ICD-10-CM | POA: Diagnosis not present

## 2020-11-28 DIAGNOSIS — M24571 Contracture, right ankle: Secondary | ICD-10-CM | POA: Diagnosis not present

## 2020-11-28 DIAGNOSIS — M6259 Muscle wasting and atrophy, not elsewhere classified, multiple sites: Secondary | ICD-10-CM | POA: Diagnosis not present

## 2020-11-28 DIAGNOSIS — I69352 Hemiplegia and hemiparesis following cerebral infarction affecting left dominant side: Secondary | ICD-10-CM | POA: Diagnosis not present

## 2020-11-28 DIAGNOSIS — I69054 Hemiplegia and hemiparesis following nontraumatic subarachnoid hemorrhage affecting left non-dominant side: Secondary | ICD-10-CM | POA: Diagnosis not present

## 2020-11-28 DIAGNOSIS — G8111 Spastic hemiplegia affecting right dominant side: Secondary | ICD-10-CM | POA: Diagnosis not present

## 2020-11-29 DIAGNOSIS — G8111 Spastic hemiplegia affecting right dominant side: Secondary | ICD-10-CM | POA: Diagnosis not present

## 2020-11-29 DIAGNOSIS — M6281 Muscle weakness (generalized): Secondary | ICD-10-CM | POA: Diagnosis not present

## 2020-11-29 DIAGNOSIS — I69054 Hemiplegia and hemiparesis following nontraumatic subarachnoid hemorrhage affecting left non-dominant side: Secondary | ICD-10-CM | POA: Diagnosis not present

## 2020-11-29 DIAGNOSIS — M6259 Muscle wasting and atrophy, not elsewhere classified, multiple sites: Secondary | ICD-10-CM | POA: Diagnosis not present

## 2020-11-29 DIAGNOSIS — M24571 Contracture, right ankle: Secondary | ICD-10-CM | POA: Diagnosis not present

## 2020-11-29 DIAGNOSIS — I69352 Hemiplegia and hemiparesis following cerebral infarction affecting left dominant side: Secondary | ICD-10-CM | POA: Diagnosis not present

## 2020-11-30 DIAGNOSIS — M6259 Muscle wasting and atrophy, not elsewhere classified, multiple sites: Secondary | ICD-10-CM | POA: Diagnosis not present

## 2020-11-30 DIAGNOSIS — G8111 Spastic hemiplegia affecting right dominant side: Secondary | ICD-10-CM | POA: Diagnosis not present

## 2020-11-30 DIAGNOSIS — I69352 Hemiplegia and hemiparesis following cerebral infarction affecting left dominant side: Secondary | ICD-10-CM | POA: Diagnosis not present

## 2020-11-30 DIAGNOSIS — M6281 Muscle weakness (generalized): Secondary | ICD-10-CM | POA: Diagnosis not present

## 2020-11-30 DIAGNOSIS — I69054 Hemiplegia and hemiparesis following nontraumatic subarachnoid hemorrhage affecting left non-dominant side: Secondary | ICD-10-CM | POA: Diagnosis not present

## 2020-11-30 DIAGNOSIS — M24571 Contracture, right ankle: Secondary | ICD-10-CM | POA: Diagnosis not present

## 2020-12-03 DIAGNOSIS — M6281 Muscle weakness (generalized): Secondary | ICD-10-CM | POA: Diagnosis not present

## 2020-12-03 DIAGNOSIS — M6259 Muscle wasting and atrophy, not elsewhere classified, multiple sites: Secondary | ICD-10-CM | POA: Diagnosis not present

## 2020-12-03 DIAGNOSIS — I69352 Hemiplegia and hemiparesis following cerebral infarction affecting left dominant side: Secondary | ICD-10-CM | POA: Diagnosis not present

## 2020-12-03 DIAGNOSIS — G8111 Spastic hemiplegia affecting right dominant side: Secondary | ICD-10-CM | POA: Diagnosis not present

## 2020-12-03 DIAGNOSIS — I69054 Hemiplegia and hemiparesis following nontraumatic subarachnoid hemorrhage affecting left non-dominant side: Secondary | ICD-10-CM | POA: Diagnosis not present

## 2020-12-03 DIAGNOSIS — M24571 Contracture, right ankle: Secondary | ICD-10-CM | POA: Diagnosis not present

## 2020-12-05 DIAGNOSIS — M24571 Contracture, right ankle: Secondary | ICD-10-CM | POA: Diagnosis not present

## 2020-12-05 DIAGNOSIS — I69352 Hemiplegia and hemiparesis following cerebral infarction affecting left dominant side: Secondary | ICD-10-CM | POA: Diagnosis not present

## 2020-12-05 DIAGNOSIS — M6259 Muscle wasting and atrophy, not elsewhere classified, multiple sites: Secondary | ICD-10-CM | POA: Diagnosis not present

## 2020-12-05 DIAGNOSIS — G8111 Spastic hemiplegia affecting right dominant side: Secondary | ICD-10-CM | POA: Diagnosis not present

## 2020-12-05 DIAGNOSIS — I69054 Hemiplegia and hemiparesis following nontraumatic subarachnoid hemorrhage affecting left non-dominant side: Secondary | ICD-10-CM | POA: Diagnosis not present

## 2020-12-05 DIAGNOSIS — M6281 Muscle weakness (generalized): Secondary | ICD-10-CM | POA: Diagnosis not present

## 2020-12-06 DIAGNOSIS — M6259 Muscle wasting and atrophy, not elsewhere classified, multiple sites: Secondary | ICD-10-CM | POA: Diagnosis not present

## 2020-12-06 DIAGNOSIS — I69054 Hemiplegia and hemiparesis following nontraumatic subarachnoid hemorrhage affecting left non-dominant side: Secondary | ICD-10-CM | POA: Diagnosis not present

## 2020-12-06 DIAGNOSIS — G8111 Spastic hemiplegia affecting right dominant side: Secondary | ICD-10-CM | POA: Diagnosis not present

## 2020-12-06 DIAGNOSIS — M24571 Contracture, right ankle: Secondary | ICD-10-CM | POA: Diagnosis not present

## 2020-12-06 DIAGNOSIS — M6281 Muscle weakness (generalized): Secondary | ICD-10-CM | POA: Diagnosis not present

## 2020-12-06 DIAGNOSIS — I69352 Hemiplegia and hemiparesis following cerebral infarction affecting left dominant side: Secondary | ICD-10-CM | POA: Diagnosis not present

## 2020-12-07 DIAGNOSIS — M6259 Muscle wasting and atrophy, not elsewhere classified, multiple sites: Secondary | ICD-10-CM | POA: Diagnosis not present

## 2020-12-07 DIAGNOSIS — I69352 Hemiplegia and hemiparesis following cerebral infarction affecting left dominant side: Secondary | ICD-10-CM | POA: Diagnosis not present

## 2020-12-07 DIAGNOSIS — I69054 Hemiplegia and hemiparesis following nontraumatic subarachnoid hemorrhage affecting left non-dominant side: Secondary | ICD-10-CM | POA: Diagnosis not present

## 2020-12-07 DIAGNOSIS — M24571 Contracture, right ankle: Secondary | ICD-10-CM | POA: Diagnosis not present

## 2020-12-07 DIAGNOSIS — G8111 Spastic hemiplegia affecting right dominant side: Secondary | ICD-10-CM | POA: Diagnosis not present

## 2020-12-07 DIAGNOSIS — M6281 Muscle weakness (generalized): Secondary | ICD-10-CM | POA: Diagnosis not present

## 2020-12-10 DIAGNOSIS — G8111 Spastic hemiplegia affecting right dominant side: Secondary | ICD-10-CM | POA: Diagnosis not present

## 2020-12-10 DIAGNOSIS — R1312 Dysphagia, oropharyngeal phase: Secondary | ICD-10-CM | POA: Diagnosis not present

## 2020-12-10 DIAGNOSIS — R4701 Aphasia: Secondary | ICD-10-CM | POA: Diagnosis not present

## 2020-12-10 DIAGNOSIS — M6281 Muscle weakness (generalized): Secondary | ICD-10-CM | POA: Diagnosis not present

## 2020-12-10 DIAGNOSIS — I69054 Hemiplegia and hemiparesis following nontraumatic subarachnoid hemorrhage affecting left non-dominant side: Secondary | ICD-10-CM | POA: Diagnosis not present

## 2020-12-10 DIAGNOSIS — M6259 Muscle wasting and atrophy, not elsewhere classified, multiple sites: Secondary | ICD-10-CM | POA: Diagnosis not present

## 2020-12-10 DIAGNOSIS — M24571 Contracture, right ankle: Secondary | ICD-10-CM | POA: Diagnosis not present

## 2020-12-10 DIAGNOSIS — R1311 Dysphagia, oral phase: Secondary | ICD-10-CM | POA: Diagnosis not present

## 2020-12-10 DIAGNOSIS — R278 Other lack of coordination: Secondary | ICD-10-CM | POA: Diagnosis not present

## 2020-12-10 DIAGNOSIS — I69391 Dysphagia following cerebral infarction: Secondary | ICD-10-CM | POA: Diagnosis not present

## 2020-12-10 DIAGNOSIS — M24572 Contracture, left ankle: Secondary | ICD-10-CM | POA: Diagnosis not present

## 2020-12-10 DIAGNOSIS — I69352 Hemiplegia and hemiparesis following cerebral infarction affecting left dominant side: Secondary | ICD-10-CM | POA: Diagnosis not present

## 2020-12-10 DIAGNOSIS — R296 Repeated falls: Secondary | ICD-10-CM | POA: Diagnosis not present

## 2020-12-11 DIAGNOSIS — G8111 Spastic hemiplegia affecting right dominant side: Secondary | ICD-10-CM | POA: Diagnosis not present

## 2020-12-11 DIAGNOSIS — M24571 Contracture, right ankle: Secondary | ICD-10-CM | POA: Diagnosis not present

## 2020-12-11 DIAGNOSIS — M6281 Muscle weakness (generalized): Secondary | ICD-10-CM | POA: Diagnosis not present

## 2020-12-11 DIAGNOSIS — I69352 Hemiplegia and hemiparesis following cerebral infarction affecting left dominant side: Secondary | ICD-10-CM | POA: Diagnosis not present

## 2020-12-11 DIAGNOSIS — M6259 Muscle wasting and atrophy, not elsewhere classified, multiple sites: Secondary | ICD-10-CM | POA: Diagnosis not present

## 2020-12-11 DIAGNOSIS — I69054 Hemiplegia and hemiparesis following nontraumatic subarachnoid hemorrhage affecting left non-dominant side: Secondary | ICD-10-CM | POA: Diagnosis not present

## 2020-12-13 DIAGNOSIS — M6259 Muscle wasting and atrophy, not elsewhere classified, multiple sites: Secondary | ICD-10-CM | POA: Diagnosis not present

## 2020-12-13 DIAGNOSIS — I69352 Hemiplegia and hemiparesis following cerebral infarction affecting left dominant side: Secondary | ICD-10-CM | POA: Diagnosis not present

## 2020-12-13 DIAGNOSIS — I69054 Hemiplegia and hemiparesis following nontraumatic subarachnoid hemorrhage affecting left non-dominant side: Secondary | ICD-10-CM | POA: Diagnosis not present

## 2020-12-13 DIAGNOSIS — M24571 Contracture, right ankle: Secondary | ICD-10-CM | POA: Diagnosis not present

## 2020-12-13 DIAGNOSIS — M6281 Muscle weakness (generalized): Secondary | ICD-10-CM | POA: Diagnosis not present

## 2020-12-13 DIAGNOSIS — G8111 Spastic hemiplegia affecting right dominant side: Secondary | ICD-10-CM | POA: Diagnosis not present

## 2020-12-14 DIAGNOSIS — I771 Stricture of artery: Secondary | ICD-10-CM | POA: Diagnosis not present

## 2020-12-14 DIAGNOSIS — I712 Thoracic aortic aneurysm, without rupture: Secondary | ICD-10-CM | POA: Diagnosis not present

## 2020-12-14 DIAGNOSIS — M24571 Contracture, right ankle: Secondary | ICD-10-CM | POA: Diagnosis not present

## 2020-12-14 DIAGNOSIS — M6259 Muscle wasting and atrophy, not elsewhere classified, multiple sites: Secondary | ICD-10-CM | POA: Diagnosis not present

## 2020-12-14 DIAGNOSIS — M6281 Muscle weakness (generalized): Secondary | ICD-10-CM | POA: Diagnosis not present

## 2020-12-14 DIAGNOSIS — I69054 Hemiplegia and hemiparesis following nontraumatic subarachnoid hemorrhage affecting left non-dominant side: Secondary | ICD-10-CM | POA: Diagnosis not present

## 2020-12-14 DIAGNOSIS — I69352 Hemiplegia and hemiparesis following cerebral infarction affecting left dominant side: Secondary | ICD-10-CM | POA: Diagnosis not present

## 2020-12-14 DIAGNOSIS — G8111 Spastic hemiplegia affecting right dominant side: Secondary | ICD-10-CM | POA: Diagnosis not present

## 2020-12-15 DIAGNOSIS — I69352 Hemiplegia and hemiparesis following cerebral infarction affecting left dominant side: Secondary | ICD-10-CM | POA: Diagnosis not present

## 2020-12-15 DIAGNOSIS — G8111 Spastic hemiplegia affecting right dominant side: Secondary | ICD-10-CM | POA: Diagnosis not present

## 2020-12-15 DIAGNOSIS — M24571 Contracture, right ankle: Secondary | ICD-10-CM | POA: Diagnosis not present

## 2020-12-15 DIAGNOSIS — M6281 Muscle weakness (generalized): Secondary | ICD-10-CM | POA: Diagnosis not present

## 2020-12-15 DIAGNOSIS — M6259 Muscle wasting and atrophy, not elsewhere classified, multiple sites: Secondary | ICD-10-CM | POA: Diagnosis not present

## 2020-12-15 DIAGNOSIS — I69054 Hemiplegia and hemiparesis following nontraumatic subarachnoid hemorrhage affecting left non-dominant side: Secondary | ICD-10-CM | POA: Diagnosis not present

## 2020-12-17 DIAGNOSIS — I69054 Hemiplegia and hemiparesis following nontraumatic subarachnoid hemorrhage affecting left non-dominant side: Secondary | ICD-10-CM | POA: Diagnosis not present

## 2020-12-17 DIAGNOSIS — M24571 Contracture, right ankle: Secondary | ICD-10-CM | POA: Diagnosis not present

## 2020-12-17 DIAGNOSIS — I69352 Hemiplegia and hemiparesis following cerebral infarction affecting left dominant side: Secondary | ICD-10-CM | POA: Diagnosis not present

## 2020-12-17 DIAGNOSIS — M6281 Muscle weakness (generalized): Secondary | ICD-10-CM | POA: Diagnosis not present

## 2020-12-17 DIAGNOSIS — G8111 Spastic hemiplegia affecting right dominant side: Secondary | ICD-10-CM | POA: Diagnosis not present

## 2020-12-17 DIAGNOSIS — M6259 Muscle wasting and atrophy, not elsewhere classified, multiple sites: Secondary | ICD-10-CM | POA: Diagnosis not present

## 2020-12-19 DIAGNOSIS — M6259 Muscle wasting and atrophy, not elsewhere classified, multiple sites: Secondary | ICD-10-CM | POA: Diagnosis not present

## 2020-12-19 DIAGNOSIS — M24571 Contracture, right ankle: Secondary | ICD-10-CM | POA: Diagnosis not present

## 2020-12-19 DIAGNOSIS — M6281 Muscle weakness (generalized): Secondary | ICD-10-CM | POA: Diagnosis not present

## 2020-12-19 DIAGNOSIS — I69352 Hemiplegia and hemiparesis following cerebral infarction affecting left dominant side: Secondary | ICD-10-CM | POA: Diagnosis not present

## 2020-12-19 DIAGNOSIS — I69054 Hemiplegia and hemiparesis following nontraumatic subarachnoid hemorrhage affecting left non-dominant side: Secondary | ICD-10-CM | POA: Diagnosis not present

## 2020-12-19 DIAGNOSIS — G8111 Spastic hemiplegia affecting right dominant side: Secondary | ICD-10-CM | POA: Diagnosis not present

## 2020-12-20 DIAGNOSIS — M6259 Muscle wasting and atrophy, not elsewhere classified, multiple sites: Secondary | ICD-10-CM | POA: Diagnosis not present

## 2020-12-20 DIAGNOSIS — M6281 Muscle weakness (generalized): Secondary | ICD-10-CM | POA: Diagnosis not present

## 2020-12-20 DIAGNOSIS — G8111 Spastic hemiplegia affecting right dominant side: Secondary | ICD-10-CM | POA: Diagnosis not present

## 2020-12-20 DIAGNOSIS — M24571 Contracture, right ankle: Secondary | ICD-10-CM | POA: Diagnosis not present

## 2020-12-20 DIAGNOSIS — I69352 Hemiplegia and hemiparesis following cerebral infarction affecting left dominant side: Secondary | ICD-10-CM | POA: Diagnosis not present

## 2020-12-20 DIAGNOSIS — I69054 Hemiplegia and hemiparesis following nontraumatic subarachnoid hemorrhage affecting left non-dominant side: Secondary | ICD-10-CM | POA: Diagnosis not present

## 2020-12-24 DIAGNOSIS — I69352 Hemiplegia and hemiparesis following cerebral infarction affecting left dominant side: Secondary | ICD-10-CM | POA: Diagnosis not present

## 2020-12-24 DIAGNOSIS — I69054 Hemiplegia and hemiparesis following nontraumatic subarachnoid hemorrhage affecting left non-dominant side: Secondary | ICD-10-CM | POA: Diagnosis not present

## 2020-12-24 DIAGNOSIS — M24571 Contracture, right ankle: Secondary | ICD-10-CM | POA: Diagnosis not present

## 2020-12-24 DIAGNOSIS — F5101 Primary insomnia: Secondary | ICD-10-CM | POA: Diagnosis not present

## 2020-12-24 DIAGNOSIS — F338 Other recurrent depressive disorders: Secondary | ICD-10-CM | POA: Diagnosis not present

## 2020-12-24 DIAGNOSIS — M6281 Muscle weakness (generalized): Secondary | ICD-10-CM | POA: Diagnosis not present

## 2020-12-24 DIAGNOSIS — M6259 Muscle wasting and atrophy, not elsewhere classified, multiple sites: Secondary | ICD-10-CM | POA: Diagnosis not present

## 2020-12-24 DIAGNOSIS — G8111 Spastic hemiplegia affecting right dominant side: Secondary | ICD-10-CM | POA: Diagnosis not present

## 2020-12-25 DIAGNOSIS — I69054 Hemiplegia and hemiparesis following nontraumatic subarachnoid hemorrhage affecting left non-dominant side: Secondary | ICD-10-CM | POA: Diagnosis not present

## 2020-12-25 DIAGNOSIS — G8111 Spastic hemiplegia affecting right dominant side: Secondary | ICD-10-CM | POA: Diagnosis not present

## 2020-12-25 DIAGNOSIS — M6281 Muscle weakness (generalized): Secondary | ICD-10-CM | POA: Diagnosis not present

## 2020-12-25 DIAGNOSIS — I69352 Hemiplegia and hemiparesis following cerebral infarction affecting left dominant side: Secondary | ICD-10-CM | POA: Diagnosis not present

## 2020-12-25 DIAGNOSIS — M24571 Contracture, right ankle: Secondary | ICD-10-CM | POA: Diagnosis not present

## 2020-12-25 DIAGNOSIS — M6259 Muscle wasting and atrophy, not elsewhere classified, multiple sites: Secondary | ICD-10-CM | POA: Diagnosis not present

## 2020-12-26 DIAGNOSIS — M24571 Contracture, right ankle: Secondary | ICD-10-CM | POA: Diagnosis not present

## 2020-12-26 DIAGNOSIS — I69352 Hemiplegia and hemiparesis following cerebral infarction affecting left dominant side: Secondary | ICD-10-CM | POA: Diagnosis not present

## 2020-12-26 DIAGNOSIS — I69054 Hemiplegia and hemiparesis following nontraumatic subarachnoid hemorrhage affecting left non-dominant side: Secondary | ICD-10-CM | POA: Diagnosis not present

## 2020-12-26 DIAGNOSIS — M6281 Muscle weakness (generalized): Secondary | ICD-10-CM | POA: Diagnosis not present

## 2020-12-26 DIAGNOSIS — M6259 Muscle wasting and atrophy, not elsewhere classified, multiple sites: Secondary | ICD-10-CM | POA: Diagnosis not present

## 2020-12-26 DIAGNOSIS — G8111 Spastic hemiplegia affecting right dominant side: Secondary | ICD-10-CM | POA: Diagnosis not present

## 2020-12-27 DIAGNOSIS — I69054 Hemiplegia and hemiparesis following nontraumatic subarachnoid hemorrhage affecting left non-dominant side: Secondary | ICD-10-CM | POA: Diagnosis not present

## 2020-12-27 DIAGNOSIS — I69352 Hemiplegia and hemiparesis following cerebral infarction affecting left dominant side: Secondary | ICD-10-CM | POA: Diagnosis not present

## 2020-12-27 DIAGNOSIS — M6281 Muscle weakness (generalized): Secondary | ICD-10-CM | POA: Diagnosis not present

## 2020-12-27 DIAGNOSIS — G8111 Spastic hemiplegia affecting right dominant side: Secondary | ICD-10-CM | POA: Diagnosis not present

## 2020-12-27 DIAGNOSIS — M6259 Muscle wasting and atrophy, not elsewhere classified, multiple sites: Secondary | ICD-10-CM | POA: Diagnosis not present

## 2020-12-27 DIAGNOSIS — M24571 Contracture, right ankle: Secondary | ICD-10-CM | POA: Diagnosis not present

## 2020-12-28 DIAGNOSIS — G8111 Spastic hemiplegia affecting right dominant side: Secondary | ICD-10-CM | POA: Diagnosis not present

## 2020-12-28 DIAGNOSIS — M24571 Contracture, right ankle: Secondary | ICD-10-CM | POA: Diagnosis not present

## 2020-12-28 DIAGNOSIS — M6281 Muscle weakness (generalized): Secondary | ICD-10-CM | POA: Diagnosis not present

## 2020-12-28 DIAGNOSIS — M6259 Muscle wasting and atrophy, not elsewhere classified, multiple sites: Secondary | ICD-10-CM | POA: Diagnosis not present

## 2020-12-28 DIAGNOSIS — I69054 Hemiplegia and hemiparesis following nontraumatic subarachnoid hemorrhage affecting left non-dominant side: Secondary | ICD-10-CM | POA: Diagnosis not present

## 2020-12-28 DIAGNOSIS — I69352 Hemiplegia and hemiparesis following cerebral infarction affecting left dominant side: Secondary | ICD-10-CM | POA: Diagnosis not present

## 2020-12-30 DIAGNOSIS — I69054 Hemiplegia and hemiparesis following nontraumatic subarachnoid hemorrhage affecting left non-dominant side: Secondary | ICD-10-CM | POA: Diagnosis not present

## 2020-12-30 DIAGNOSIS — I69352 Hemiplegia and hemiparesis following cerebral infarction affecting left dominant side: Secondary | ICD-10-CM | POA: Diagnosis not present

## 2020-12-30 DIAGNOSIS — M6259 Muscle wasting and atrophy, not elsewhere classified, multiple sites: Secondary | ICD-10-CM | POA: Diagnosis not present

## 2020-12-30 DIAGNOSIS — M24571 Contracture, right ankle: Secondary | ICD-10-CM | POA: Diagnosis not present

## 2020-12-30 DIAGNOSIS — M6281 Muscle weakness (generalized): Secondary | ICD-10-CM | POA: Diagnosis not present

## 2020-12-30 DIAGNOSIS — G8111 Spastic hemiplegia affecting right dominant side: Secondary | ICD-10-CM | POA: Diagnosis not present

## 2020-12-31 DIAGNOSIS — I69054 Hemiplegia and hemiparesis following nontraumatic subarachnoid hemorrhage affecting left non-dominant side: Secondary | ICD-10-CM | POA: Diagnosis not present

## 2020-12-31 DIAGNOSIS — M24571 Contracture, right ankle: Secondary | ICD-10-CM | POA: Diagnosis not present

## 2020-12-31 DIAGNOSIS — G8111 Spastic hemiplegia affecting right dominant side: Secondary | ICD-10-CM | POA: Diagnosis not present

## 2020-12-31 DIAGNOSIS — I69352 Hemiplegia and hemiparesis following cerebral infarction affecting left dominant side: Secondary | ICD-10-CM | POA: Diagnosis not present

## 2020-12-31 DIAGNOSIS — M6259 Muscle wasting and atrophy, not elsewhere classified, multiple sites: Secondary | ICD-10-CM | POA: Diagnosis not present

## 2020-12-31 DIAGNOSIS — M6281 Muscle weakness (generalized): Secondary | ICD-10-CM | POA: Diagnosis not present

## 2021-01-01 DIAGNOSIS — M6281 Muscle weakness (generalized): Secondary | ICD-10-CM | POA: Diagnosis not present

## 2021-01-01 DIAGNOSIS — M6259 Muscle wasting and atrophy, not elsewhere classified, multiple sites: Secondary | ICD-10-CM | POA: Diagnosis not present

## 2021-01-01 DIAGNOSIS — I69054 Hemiplegia and hemiparesis following nontraumatic subarachnoid hemorrhage affecting left non-dominant side: Secondary | ICD-10-CM | POA: Diagnosis not present

## 2021-01-01 DIAGNOSIS — M24571 Contracture, right ankle: Secondary | ICD-10-CM | POA: Diagnosis not present

## 2021-01-01 DIAGNOSIS — G8111 Spastic hemiplegia affecting right dominant side: Secondary | ICD-10-CM | POA: Diagnosis not present

## 2021-01-01 DIAGNOSIS — I69352 Hemiplegia and hemiparesis following cerebral infarction affecting left dominant side: Secondary | ICD-10-CM | POA: Diagnosis not present

## 2021-01-02 DIAGNOSIS — G8111 Spastic hemiplegia affecting right dominant side: Secondary | ICD-10-CM | POA: Diagnosis not present

## 2021-01-02 DIAGNOSIS — M24571 Contracture, right ankle: Secondary | ICD-10-CM | POA: Diagnosis not present

## 2021-01-02 DIAGNOSIS — M6281 Muscle weakness (generalized): Secondary | ICD-10-CM | POA: Diagnosis not present

## 2021-01-02 DIAGNOSIS — M6259 Muscle wasting and atrophy, not elsewhere classified, multiple sites: Secondary | ICD-10-CM | POA: Diagnosis not present

## 2021-01-02 DIAGNOSIS — I69352 Hemiplegia and hemiparesis following cerebral infarction affecting left dominant side: Secondary | ICD-10-CM | POA: Diagnosis not present

## 2021-01-02 DIAGNOSIS — I69054 Hemiplegia and hemiparesis following nontraumatic subarachnoid hemorrhage affecting left non-dominant side: Secondary | ICD-10-CM | POA: Diagnosis not present

## 2021-01-03 DIAGNOSIS — I69054 Hemiplegia and hemiparesis following nontraumatic subarachnoid hemorrhage affecting left non-dominant side: Secondary | ICD-10-CM | POA: Diagnosis not present

## 2021-01-03 DIAGNOSIS — I69352 Hemiplegia and hemiparesis following cerebral infarction affecting left dominant side: Secondary | ICD-10-CM | POA: Diagnosis not present

## 2021-01-03 DIAGNOSIS — M6259 Muscle wasting and atrophy, not elsewhere classified, multiple sites: Secondary | ICD-10-CM | POA: Diagnosis not present

## 2021-01-03 DIAGNOSIS — M24571 Contracture, right ankle: Secondary | ICD-10-CM | POA: Diagnosis not present

## 2021-01-03 DIAGNOSIS — M6281 Muscle weakness (generalized): Secondary | ICD-10-CM | POA: Diagnosis not present

## 2021-01-03 DIAGNOSIS — G8111 Spastic hemiplegia affecting right dominant side: Secondary | ICD-10-CM | POA: Diagnosis not present

## 2021-01-04 DIAGNOSIS — G8111 Spastic hemiplegia affecting right dominant side: Secondary | ICD-10-CM | POA: Diagnosis not present

## 2021-01-04 DIAGNOSIS — M24571 Contracture, right ankle: Secondary | ICD-10-CM | POA: Diagnosis not present

## 2021-01-04 DIAGNOSIS — M6281 Muscle weakness (generalized): Secondary | ICD-10-CM | POA: Diagnosis not present

## 2021-01-04 DIAGNOSIS — I69054 Hemiplegia and hemiparesis following nontraumatic subarachnoid hemorrhage affecting left non-dominant side: Secondary | ICD-10-CM | POA: Diagnosis not present

## 2021-01-04 DIAGNOSIS — M6259 Muscle wasting and atrophy, not elsewhere classified, multiple sites: Secondary | ICD-10-CM | POA: Diagnosis not present

## 2021-01-04 DIAGNOSIS — I69352 Hemiplegia and hemiparesis following cerebral infarction affecting left dominant side: Secondary | ICD-10-CM | POA: Diagnosis not present

## 2021-01-06 DIAGNOSIS — M6281 Muscle weakness (generalized): Secondary | ICD-10-CM | POA: Diagnosis not present

## 2021-01-06 DIAGNOSIS — M6259 Muscle wasting and atrophy, not elsewhere classified, multiple sites: Secondary | ICD-10-CM | POA: Diagnosis not present

## 2021-01-06 DIAGNOSIS — I69054 Hemiplegia and hemiparesis following nontraumatic subarachnoid hemorrhage affecting left non-dominant side: Secondary | ICD-10-CM | POA: Diagnosis not present

## 2021-01-06 DIAGNOSIS — I69352 Hemiplegia and hemiparesis following cerebral infarction affecting left dominant side: Secondary | ICD-10-CM | POA: Diagnosis not present

## 2021-01-06 DIAGNOSIS — M24571 Contracture, right ankle: Secondary | ICD-10-CM | POA: Diagnosis not present

## 2021-01-06 DIAGNOSIS — G8111 Spastic hemiplegia affecting right dominant side: Secondary | ICD-10-CM | POA: Diagnosis not present

## 2021-01-07 DIAGNOSIS — M6281 Muscle weakness (generalized): Secondary | ICD-10-CM | POA: Diagnosis not present

## 2021-01-07 DIAGNOSIS — M6259 Muscle wasting and atrophy, not elsewhere classified, multiple sites: Secondary | ICD-10-CM | POA: Diagnosis not present

## 2021-01-07 DIAGNOSIS — G8111 Spastic hemiplegia affecting right dominant side: Secondary | ICD-10-CM | POA: Diagnosis not present

## 2021-01-07 DIAGNOSIS — I69352 Hemiplegia and hemiparesis following cerebral infarction affecting left dominant side: Secondary | ICD-10-CM | POA: Diagnosis not present

## 2021-01-07 DIAGNOSIS — M24571 Contracture, right ankle: Secondary | ICD-10-CM | POA: Diagnosis not present

## 2021-01-07 DIAGNOSIS — I69054 Hemiplegia and hemiparesis following nontraumatic subarachnoid hemorrhage affecting left non-dominant side: Secondary | ICD-10-CM | POA: Diagnosis not present

## 2021-01-08 DIAGNOSIS — M6259 Muscle wasting and atrophy, not elsewhere classified, multiple sites: Secondary | ICD-10-CM | POA: Diagnosis not present

## 2021-01-08 DIAGNOSIS — M24571 Contracture, right ankle: Secondary | ICD-10-CM | POA: Diagnosis not present

## 2021-01-08 DIAGNOSIS — M6281 Muscle weakness (generalized): Secondary | ICD-10-CM | POA: Diagnosis not present

## 2021-01-08 DIAGNOSIS — I69352 Hemiplegia and hemiparesis following cerebral infarction affecting left dominant side: Secondary | ICD-10-CM | POA: Diagnosis not present

## 2021-01-08 DIAGNOSIS — I69054 Hemiplegia and hemiparesis following nontraumatic subarachnoid hemorrhage affecting left non-dominant side: Secondary | ICD-10-CM | POA: Diagnosis not present

## 2021-01-08 DIAGNOSIS — G8111 Spastic hemiplegia affecting right dominant side: Secondary | ICD-10-CM | POA: Diagnosis not present

## 2021-01-09 DIAGNOSIS — M6281 Muscle weakness (generalized): Secondary | ICD-10-CM | POA: Diagnosis not present

## 2021-01-09 DIAGNOSIS — I69352 Hemiplegia and hemiparesis following cerebral infarction affecting left dominant side: Secondary | ICD-10-CM | POA: Diagnosis not present

## 2021-01-09 DIAGNOSIS — M24572 Contracture, left ankle: Secondary | ICD-10-CM | POA: Diagnosis not present

## 2021-01-09 DIAGNOSIS — R278 Other lack of coordination: Secondary | ICD-10-CM | POA: Diagnosis not present

## 2021-01-09 DIAGNOSIS — I69391 Dysphagia following cerebral infarction: Secondary | ICD-10-CM | POA: Diagnosis not present

## 2021-01-09 DIAGNOSIS — M24571 Contracture, right ankle: Secondary | ICD-10-CM | POA: Diagnosis not present

## 2021-01-09 DIAGNOSIS — R296 Repeated falls: Secondary | ICD-10-CM | POA: Diagnosis not present

## 2021-01-09 DIAGNOSIS — M6259 Muscle wasting and atrophy, not elsewhere classified, multiple sites: Secondary | ICD-10-CM | POA: Diagnosis not present

## 2021-01-09 DIAGNOSIS — R4701 Aphasia: Secondary | ICD-10-CM | POA: Diagnosis not present

## 2021-01-09 DIAGNOSIS — R1311 Dysphagia, oral phase: Secondary | ICD-10-CM | POA: Diagnosis not present

## 2021-01-09 DIAGNOSIS — G8111 Spastic hemiplegia affecting right dominant side: Secondary | ICD-10-CM | POA: Diagnosis not present

## 2021-01-09 DIAGNOSIS — R1312 Dysphagia, oropharyngeal phase: Secondary | ICD-10-CM | POA: Diagnosis not present

## 2021-01-09 DIAGNOSIS — I69054 Hemiplegia and hemiparesis following nontraumatic subarachnoid hemorrhage affecting left non-dominant side: Secondary | ICD-10-CM | POA: Diagnosis not present

## 2021-01-10 DIAGNOSIS — M6281 Muscle weakness (generalized): Secondary | ICD-10-CM | POA: Diagnosis not present

## 2021-01-10 DIAGNOSIS — M6259 Muscle wasting and atrophy, not elsewhere classified, multiple sites: Secondary | ICD-10-CM | POA: Diagnosis not present

## 2021-01-10 DIAGNOSIS — I69352 Hemiplegia and hemiparesis following cerebral infarction affecting left dominant side: Secondary | ICD-10-CM | POA: Diagnosis not present

## 2021-01-10 DIAGNOSIS — M24571 Contracture, right ankle: Secondary | ICD-10-CM | POA: Diagnosis not present

## 2021-01-10 DIAGNOSIS — I69054 Hemiplegia and hemiparesis following nontraumatic subarachnoid hemorrhage affecting left non-dominant side: Secondary | ICD-10-CM | POA: Diagnosis not present

## 2021-01-10 DIAGNOSIS — G8111 Spastic hemiplegia affecting right dominant side: Secondary | ICD-10-CM | POA: Diagnosis not present

## 2021-01-11 DIAGNOSIS — I69352 Hemiplegia and hemiparesis following cerebral infarction affecting left dominant side: Secondary | ICD-10-CM | POA: Diagnosis not present

## 2021-01-11 DIAGNOSIS — G8111 Spastic hemiplegia affecting right dominant side: Secondary | ICD-10-CM | POA: Diagnosis not present

## 2021-01-11 DIAGNOSIS — M6281 Muscle weakness (generalized): Secondary | ICD-10-CM | POA: Diagnosis not present

## 2021-01-11 DIAGNOSIS — M6259 Muscle wasting and atrophy, not elsewhere classified, multiple sites: Secondary | ICD-10-CM | POA: Diagnosis not present

## 2021-01-11 DIAGNOSIS — M24571 Contracture, right ankle: Secondary | ICD-10-CM | POA: Diagnosis not present

## 2021-01-11 DIAGNOSIS — I69054 Hemiplegia and hemiparesis following nontraumatic subarachnoid hemorrhage affecting left non-dominant side: Secondary | ICD-10-CM | POA: Diagnosis not present

## 2021-01-12 DIAGNOSIS — M6259 Muscle wasting and atrophy, not elsewhere classified, multiple sites: Secondary | ICD-10-CM | POA: Diagnosis not present

## 2021-01-12 DIAGNOSIS — G8111 Spastic hemiplegia affecting right dominant side: Secondary | ICD-10-CM | POA: Diagnosis not present

## 2021-01-12 DIAGNOSIS — M6281 Muscle weakness (generalized): Secondary | ICD-10-CM | POA: Diagnosis not present

## 2021-01-12 DIAGNOSIS — M24571 Contracture, right ankle: Secondary | ICD-10-CM | POA: Diagnosis not present

## 2021-01-12 DIAGNOSIS — I69352 Hemiplegia and hemiparesis following cerebral infarction affecting left dominant side: Secondary | ICD-10-CM | POA: Diagnosis not present

## 2021-01-12 DIAGNOSIS — I69054 Hemiplegia and hemiparesis following nontraumatic subarachnoid hemorrhage affecting left non-dominant side: Secondary | ICD-10-CM | POA: Diagnosis not present

## 2021-01-13 DIAGNOSIS — M6259 Muscle wasting and atrophy, not elsewhere classified, multiple sites: Secondary | ICD-10-CM | POA: Diagnosis not present

## 2021-01-13 DIAGNOSIS — I69054 Hemiplegia and hemiparesis following nontraumatic subarachnoid hemorrhage affecting left non-dominant side: Secondary | ICD-10-CM | POA: Diagnosis not present

## 2021-01-13 DIAGNOSIS — M24571 Contracture, right ankle: Secondary | ICD-10-CM | POA: Diagnosis not present

## 2021-01-13 DIAGNOSIS — G8111 Spastic hemiplegia affecting right dominant side: Secondary | ICD-10-CM | POA: Diagnosis not present

## 2021-01-13 DIAGNOSIS — M6281 Muscle weakness (generalized): Secondary | ICD-10-CM | POA: Diagnosis not present

## 2021-01-13 DIAGNOSIS — I69352 Hemiplegia and hemiparesis following cerebral infarction affecting left dominant side: Secondary | ICD-10-CM | POA: Diagnosis not present

## 2021-01-14 DIAGNOSIS — M24571 Contracture, right ankle: Secondary | ICD-10-CM | POA: Diagnosis not present

## 2021-01-14 DIAGNOSIS — I69352 Hemiplegia and hemiparesis following cerebral infarction affecting left dominant side: Secondary | ICD-10-CM | POA: Diagnosis not present

## 2021-01-14 DIAGNOSIS — M6281 Muscle weakness (generalized): Secondary | ICD-10-CM | POA: Diagnosis not present

## 2021-01-14 DIAGNOSIS — I69054 Hemiplegia and hemiparesis following nontraumatic subarachnoid hemorrhage affecting left non-dominant side: Secondary | ICD-10-CM | POA: Diagnosis not present

## 2021-01-14 DIAGNOSIS — G8111 Spastic hemiplegia affecting right dominant side: Secondary | ICD-10-CM | POA: Diagnosis not present

## 2021-01-14 DIAGNOSIS — M6259 Muscle wasting and atrophy, not elsewhere classified, multiple sites: Secondary | ICD-10-CM | POA: Diagnosis not present

## 2021-01-16 DIAGNOSIS — M24571 Contracture, right ankle: Secondary | ICD-10-CM | POA: Diagnosis not present

## 2021-01-16 DIAGNOSIS — G8111 Spastic hemiplegia affecting right dominant side: Secondary | ICD-10-CM | POA: Diagnosis not present

## 2021-01-16 DIAGNOSIS — M6259 Muscle wasting and atrophy, not elsewhere classified, multiple sites: Secondary | ICD-10-CM | POA: Diagnosis not present

## 2021-01-16 DIAGNOSIS — I69054 Hemiplegia and hemiparesis following nontraumatic subarachnoid hemorrhage affecting left non-dominant side: Secondary | ICD-10-CM | POA: Diagnosis not present

## 2021-01-16 DIAGNOSIS — M6281 Muscle weakness (generalized): Secondary | ICD-10-CM | POA: Diagnosis not present

## 2021-01-16 DIAGNOSIS — I69352 Hemiplegia and hemiparesis following cerebral infarction affecting left dominant side: Secondary | ICD-10-CM | POA: Diagnosis not present

## 2021-01-17 DIAGNOSIS — G8111 Spastic hemiplegia affecting right dominant side: Secondary | ICD-10-CM | POA: Diagnosis not present

## 2021-01-17 DIAGNOSIS — I69352 Hemiplegia and hemiparesis following cerebral infarction affecting left dominant side: Secondary | ICD-10-CM | POA: Diagnosis not present

## 2021-01-17 DIAGNOSIS — M6281 Muscle weakness (generalized): Secondary | ICD-10-CM | POA: Diagnosis not present

## 2021-01-17 DIAGNOSIS — M24571 Contracture, right ankle: Secondary | ICD-10-CM | POA: Diagnosis not present

## 2021-01-17 DIAGNOSIS — M6259 Muscle wasting and atrophy, not elsewhere classified, multiple sites: Secondary | ICD-10-CM | POA: Diagnosis not present

## 2021-01-17 DIAGNOSIS — I69054 Hemiplegia and hemiparesis following nontraumatic subarachnoid hemorrhage affecting left non-dominant side: Secondary | ICD-10-CM | POA: Diagnosis not present

## 2021-01-20 DIAGNOSIS — I69054 Hemiplegia and hemiparesis following nontraumatic subarachnoid hemorrhage affecting left non-dominant side: Secondary | ICD-10-CM | POA: Diagnosis not present

## 2021-01-20 DIAGNOSIS — G8111 Spastic hemiplegia affecting right dominant side: Secondary | ICD-10-CM | POA: Diagnosis not present

## 2021-01-20 DIAGNOSIS — I69352 Hemiplegia and hemiparesis following cerebral infarction affecting left dominant side: Secondary | ICD-10-CM | POA: Diagnosis not present

## 2021-01-20 DIAGNOSIS — M6281 Muscle weakness (generalized): Secondary | ICD-10-CM | POA: Diagnosis not present

## 2021-01-20 DIAGNOSIS — M24571 Contracture, right ankle: Secondary | ICD-10-CM | POA: Diagnosis not present

## 2021-01-20 DIAGNOSIS — M6259 Muscle wasting and atrophy, not elsewhere classified, multiple sites: Secondary | ICD-10-CM | POA: Diagnosis not present

## 2021-01-21 DIAGNOSIS — F338 Other recurrent depressive disorders: Secondary | ICD-10-CM | POA: Diagnosis not present

## 2021-01-21 DIAGNOSIS — M6259 Muscle wasting and atrophy, not elsewhere classified, multiple sites: Secondary | ICD-10-CM | POA: Diagnosis not present

## 2021-01-21 DIAGNOSIS — F5101 Primary insomnia: Secondary | ICD-10-CM | POA: Diagnosis not present

## 2021-01-21 DIAGNOSIS — G8111 Spastic hemiplegia affecting right dominant side: Secondary | ICD-10-CM | POA: Diagnosis not present

## 2021-01-21 DIAGNOSIS — I69054 Hemiplegia and hemiparesis following nontraumatic subarachnoid hemorrhage affecting left non-dominant side: Secondary | ICD-10-CM | POA: Diagnosis not present

## 2021-01-21 DIAGNOSIS — M6281 Muscle weakness (generalized): Secondary | ICD-10-CM | POA: Diagnosis not present

## 2021-01-21 DIAGNOSIS — M24571 Contracture, right ankle: Secondary | ICD-10-CM | POA: Diagnosis not present

## 2021-01-21 DIAGNOSIS — I69352 Hemiplegia and hemiparesis following cerebral infarction affecting left dominant side: Secondary | ICD-10-CM | POA: Diagnosis not present

## 2021-01-22 DIAGNOSIS — M24571 Contracture, right ankle: Secondary | ICD-10-CM | POA: Diagnosis not present

## 2021-01-22 DIAGNOSIS — M6259 Muscle wasting and atrophy, not elsewhere classified, multiple sites: Secondary | ICD-10-CM | POA: Diagnosis not present

## 2021-01-22 DIAGNOSIS — M6281 Muscle weakness (generalized): Secondary | ICD-10-CM | POA: Diagnosis not present

## 2021-01-22 DIAGNOSIS — G8111 Spastic hemiplegia affecting right dominant side: Secondary | ICD-10-CM | POA: Diagnosis not present

## 2021-01-22 DIAGNOSIS — I69352 Hemiplegia and hemiparesis following cerebral infarction affecting left dominant side: Secondary | ICD-10-CM | POA: Diagnosis not present

## 2021-01-22 DIAGNOSIS — I69054 Hemiplegia and hemiparesis following nontraumatic subarachnoid hemorrhage affecting left non-dominant side: Secondary | ICD-10-CM | POA: Diagnosis not present

## 2021-01-23 DIAGNOSIS — I69352 Hemiplegia and hemiparesis following cerebral infarction affecting left dominant side: Secondary | ICD-10-CM | POA: Diagnosis not present

## 2021-01-23 DIAGNOSIS — I69054 Hemiplegia and hemiparesis following nontraumatic subarachnoid hemorrhage affecting left non-dominant side: Secondary | ICD-10-CM | POA: Diagnosis not present

## 2021-01-23 DIAGNOSIS — M6281 Muscle weakness (generalized): Secondary | ICD-10-CM | POA: Diagnosis not present

## 2021-01-23 DIAGNOSIS — G8111 Spastic hemiplegia affecting right dominant side: Secondary | ICD-10-CM | POA: Diagnosis not present

## 2021-01-23 DIAGNOSIS — M24571 Contracture, right ankle: Secondary | ICD-10-CM | POA: Diagnosis not present

## 2021-01-23 DIAGNOSIS — M6259 Muscle wasting and atrophy, not elsewhere classified, multiple sites: Secondary | ICD-10-CM | POA: Diagnosis not present

## 2021-01-24 DIAGNOSIS — I69352 Hemiplegia and hemiparesis following cerebral infarction affecting left dominant side: Secondary | ICD-10-CM | POA: Diagnosis not present

## 2021-01-24 DIAGNOSIS — M6259 Muscle wasting and atrophy, not elsewhere classified, multiple sites: Secondary | ICD-10-CM | POA: Diagnosis not present

## 2021-01-24 DIAGNOSIS — G8111 Spastic hemiplegia affecting right dominant side: Secondary | ICD-10-CM | POA: Diagnosis not present

## 2021-01-24 DIAGNOSIS — M6281 Muscle weakness (generalized): Secondary | ICD-10-CM | POA: Diagnosis not present

## 2021-01-24 DIAGNOSIS — I69054 Hemiplegia and hemiparesis following nontraumatic subarachnoid hemorrhage affecting left non-dominant side: Secondary | ICD-10-CM | POA: Diagnosis not present

## 2021-01-24 DIAGNOSIS — M24571 Contracture, right ankle: Secondary | ICD-10-CM | POA: Diagnosis not present

## 2021-01-25 DIAGNOSIS — M6259 Muscle wasting and atrophy, not elsewhere classified, multiple sites: Secondary | ICD-10-CM | POA: Diagnosis not present

## 2021-01-25 DIAGNOSIS — M6281 Muscle weakness (generalized): Secondary | ICD-10-CM | POA: Diagnosis not present

## 2021-01-25 DIAGNOSIS — I69054 Hemiplegia and hemiparesis following nontraumatic subarachnoid hemorrhage affecting left non-dominant side: Secondary | ICD-10-CM | POA: Diagnosis not present

## 2021-01-25 DIAGNOSIS — G8111 Spastic hemiplegia affecting right dominant side: Secondary | ICD-10-CM | POA: Diagnosis not present

## 2021-01-25 DIAGNOSIS — M24571 Contracture, right ankle: Secondary | ICD-10-CM | POA: Diagnosis not present

## 2021-01-25 DIAGNOSIS — I69352 Hemiplegia and hemiparesis following cerebral infarction affecting left dominant side: Secondary | ICD-10-CM | POA: Diagnosis not present

## 2021-01-28 DIAGNOSIS — M6281 Muscle weakness (generalized): Secondary | ICD-10-CM | POA: Diagnosis not present

## 2021-01-28 DIAGNOSIS — G8111 Spastic hemiplegia affecting right dominant side: Secondary | ICD-10-CM | POA: Diagnosis not present

## 2021-01-28 DIAGNOSIS — M6259 Muscle wasting and atrophy, not elsewhere classified, multiple sites: Secondary | ICD-10-CM | POA: Diagnosis not present

## 2021-01-28 DIAGNOSIS — I69352 Hemiplegia and hemiparesis following cerebral infarction affecting left dominant side: Secondary | ICD-10-CM | POA: Diagnosis not present

## 2021-01-28 DIAGNOSIS — I69054 Hemiplegia and hemiparesis following nontraumatic subarachnoid hemorrhage affecting left non-dominant side: Secondary | ICD-10-CM | POA: Diagnosis not present

## 2021-01-28 DIAGNOSIS — M24571 Contracture, right ankle: Secondary | ICD-10-CM | POA: Diagnosis not present

## 2021-01-29 DIAGNOSIS — I69054 Hemiplegia and hemiparesis following nontraumatic subarachnoid hemorrhage affecting left non-dominant side: Secondary | ICD-10-CM | POA: Diagnosis not present

## 2021-01-29 DIAGNOSIS — M24571 Contracture, right ankle: Secondary | ICD-10-CM | POA: Diagnosis not present

## 2021-01-29 DIAGNOSIS — G8111 Spastic hemiplegia affecting right dominant side: Secondary | ICD-10-CM | POA: Diagnosis not present

## 2021-01-29 DIAGNOSIS — I69352 Hemiplegia and hemiparesis following cerebral infarction affecting left dominant side: Secondary | ICD-10-CM | POA: Diagnosis not present

## 2021-01-29 DIAGNOSIS — M6281 Muscle weakness (generalized): Secondary | ICD-10-CM | POA: Diagnosis not present

## 2021-01-29 DIAGNOSIS — M6259 Muscle wasting and atrophy, not elsewhere classified, multiple sites: Secondary | ICD-10-CM | POA: Diagnosis not present

## 2021-01-30 DIAGNOSIS — I69054 Hemiplegia and hemiparesis following nontraumatic subarachnoid hemorrhage affecting left non-dominant side: Secondary | ICD-10-CM | POA: Diagnosis not present

## 2021-01-30 DIAGNOSIS — M24571 Contracture, right ankle: Secondary | ICD-10-CM | POA: Diagnosis not present

## 2021-01-30 DIAGNOSIS — I69352 Hemiplegia and hemiparesis following cerebral infarction affecting left dominant side: Secondary | ICD-10-CM | POA: Diagnosis not present

## 2021-01-30 DIAGNOSIS — G8111 Spastic hemiplegia affecting right dominant side: Secondary | ICD-10-CM | POA: Diagnosis not present

## 2021-01-30 DIAGNOSIS — M6259 Muscle wasting and atrophy, not elsewhere classified, multiple sites: Secondary | ICD-10-CM | POA: Diagnosis not present

## 2021-01-30 DIAGNOSIS — M6281 Muscle weakness (generalized): Secondary | ICD-10-CM | POA: Diagnosis not present

## 2021-01-31 DIAGNOSIS — M6281 Muscle weakness (generalized): Secondary | ICD-10-CM | POA: Diagnosis not present

## 2021-01-31 DIAGNOSIS — I69054 Hemiplegia and hemiparesis following nontraumatic subarachnoid hemorrhage affecting left non-dominant side: Secondary | ICD-10-CM | POA: Diagnosis not present

## 2021-01-31 DIAGNOSIS — G8111 Spastic hemiplegia affecting right dominant side: Secondary | ICD-10-CM | POA: Diagnosis not present

## 2021-01-31 DIAGNOSIS — M6259 Muscle wasting and atrophy, not elsewhere classified, multiple sites: Secondary | ICD-10-CM | POA: Diagnosis not present

## 2021-01-31 DIAGNOSIS — M24571 Contracture, right ankle: Secondary | ICD-10-CM | POA: Diagnosis not present

## 2021-01-31 DIAGNOSIS — I69352 Hemiplegia and hemiparesis following cerebral infarction affecting left dominant side: Secondary | ICD-10-CM | POA: Diagnosis not present

## 2021-02-01 DIAGNOSIS — I10 Essential (primary) hypertension: Secondary | ICD-10-CM | POA: Diagnosis not present

## 2021-02-01 DIAGNOSIS — E782 Mixed hyperlipidemia: Secondary | ICD-10-CM | POA: Diagnosis not present

## 2021-02-01 DIAGNOSIS — K219 Gastro-esophageal reflux disease without esophagitis: Secondary | ICD-10-CM | POA: Diagnosis not present

## 2021-02-01 DIAGNOSIS — I2510 Atherosclerotic heart disease of native coronary artery without angina pectoris: Secondary | ICD-10-CM | POA: Diagnosis not present

## 2021-02-01 DIAGNOSIS — J449 Chronic obstructive pulmonary disease, unspecified: Secondary | ICD-10-CM | POA: Diagnosis not present

## 2021-02-01 DIAGNOSIS — I69854 Hemiplegia and hemiparesis following other cerebrovascular disease affecting left non-dominant side: Secondary | ICD-10-CM | POA: Diagnosis not present

## 2021-02-01 DIAGNOSIS — E1122 Type 2 diabetes mellitus with diabetic chronic kidney disease: Secondary | ICD-10-CM | POA: Diagnosis not present

## 2021-02-01 DIAGNOSIS — I2782 Chronic pulmonary embolism: Secondary | ICD-10-CM | POA: Diagnosis not present

## 2021-02-01 DIAGNOSIS — Z7984 Long term (current) use of oral hypoglycemic drugs: Secondary | ICD-10-CM | POA: Diagnosis not present

## 2021-02-04 DIAGNOSIS — M24571 Contracture, right ankle: Secondary | ICD-10-CM | POA: Diagnosis not present

## 2021-02-04 DIAGNOSIS — M6281 Muscle weakness (generalized): Secondary | ICD-10-CM | POA: Diagnosis not present

## 2021-02-04 DIAGNOSIS — I69054 Hemiplegia and hemiparesis following nontraumatic subarachnoid hemorrhage affecting left non-dominant side: Secondary | ICD-10-CM | POA: Diagnosis not present

## 2021-02-04 DIAGNOSIS — I69352 Hemiplegia and hemiparesis following cerebral infarction affecting left dominant side: Secondary | ICD-10-CM | POA: Diagnosis not present

## 2021-02-04 DIAGNOSIS — G8111 Spastic hemiplegia affecting right dominant side: Secondary | ICD-10-CM | POA: Diagnosis not present

## 2021-02-04 DIAGNOSIS — M6259 Muscle wasting and atrophy, not elsewhere classified, multiple sites: Secondary | ICD-10-CM | POA: Diagnosis not present

## 2021-02-05 DIAGNOSIS — I69054 Hemiplegia and hemiparesis following nontraumatic subarachnoid hemorrhage affecting left non-dominant side: Secondary | ICD-10-CM | POA: Diagnosis not present

## 2021-02-05 DIAGNOSIS — M6281 Muscle weakness (generalized): Secondary | ICD-10-CM | POA: Diagnosis not present

## 2021-02-05 DIAGNOSIS — I69352 Hemiplegia and hemiparesis following cerebral infarction affecting left dominant side: Secondary | ICD-10-CM | POA: Diagnosis not present

## 2021-02-05 DIAGNOSIS — G8111 Spastic hemiplegia affecting right dominant side: Secondary | ICD-10-CM | POA: Diagnosis not present

## 2021-02-05 DIAGNOSIS — M24571 Contracture, right ankle: Secondary | ICD-10-CM | POA: Diagnosis not present

## 2021-02-05 DIAGNOSIS — M6259 Muscle wasting and atrophy, not elsewhere classified, multiple sites: Secondary | ICD-10-CM | POA: Diagnosis not present

## 2021-02-09 DIAGNOSIS — I69054 Hemiplegia and hemiparesis following nontraumatic subarachnoid hemorrhage affecting left non-dominant side: Secondary | ICD-10-CM | POA: Diagnosis not present

## 2021-02-09 DIAGNOSIS — M6281 Muscle weakness (generalized): Secondary | ICD-10-CM | POA: Diagnosis not present

## 2021-02-09 DIAGNOSIS — M24571 Contracture, right ankle: Secondary | ICD-10-CM | POA: Diagnosis not present

## 2021-02-09 DIAGNOSIS — R296 Repeated falls: Secondary | ICD-10-CM | POA: Diagnosis not present

## 2021-02-09 DIAGNOSIS — R4701 Aphasia: Secondary | ICD-10-CM | POA: Diagnosis not present

## 2021-02-09 DIAGNOSIS — M6259 Muscle wasting and atrophy, not elsewhere classified, multiple sites: Secondary | ICD-10-CM | POA: Diagnosis not present

## 2021-02-09 DIAGNOSIS — M24572 Contracture, left ankle: Secondary | ICD-10-CM | POA: Diagnosis not present

## 2021-02-09 DIAGNOSIS — G8111 Spastic hemiplegia affecting right dominant side: Secondary | ICD-10-CM | POA: Diagnosis not present

## 2021-02-09 DIAGNOSIS — I69391 Dysphagia following cerebral infarction: Secondary | ICD-10-CM | POA: Diagnosis not present

## 2021-02-09 DIAGNOSIS — R1312 Dysphagia, oropharyngeal phase: Secondary | ICD-10-CM | POA: Diagnosis not present

## 2021-02-09 DIAGNOSIS — R1311 Dysphagia, oral phase: Secondary | ICD-10-CM | POA: Diagnosis not present

## 2021-02-09 DIAGNOSIS — I69352 Hemiplegia and hemiparesis following cerebral infarction affecting left dominant side: Secondary | ICD-10-CM | POA: Diagnosis not present

## 2021-02-09 DIAGNOSIS — R278 Other lack of coordination: Secondary | ICD-10-CM | POA: Diagnosis not present

## 2021-02-11 DIAGNOSIS — I69352 Hemiplegia and hemiparesis following cerebral infarction affecting left dominant side: Secondary | ICD-10-CM | POA: Diagnosis not present

## 2021-02-11 DIAGNOSIS — M6259 Muscle wasting and atrophy, not elsewhere classified, multiple sites: Secondary | ICD-10-CM | POA: Diagnosis not present

## 2021-02-11 DIAGNOSIS — M6281 Muscle weakness (generalized): Secondary | ICD-10-CM | POA: Diagnosis not present

## 2021-02-11 DIAGNOSIS — I69054 Hemiplegia and hemiparesis following nontraumatic subarachnoid hemorrhage affecting left non-dominant side: Secondary | ICD-10-CM | POA: Diagnosis not present

## 2021-02-11 DIAGNOSIS — R296 Repeated falls: Secondary | ICD-10-CM | POA: Diagnosis not present

## 2021-02-11 DIAGNOSIS — G8111 Spastic hemiplegia affecting right dominant side: Secondary | ICD-10-CM | POA: Diagnosis not present

## 2021-02-12 DIAGNOSIS — M6259 Muscle wasting and atrophy, not elsewhere classified, multiple sites: Secondary | ICD-10-CM | POA: Diagnosis not present

## 2021-02-12 DIAGNOSIS — R296 Repeated falls: Secondary | ICD-10-CM | POA: Diagnosis not present

## 2021-02-12 DIAGNOSIS — G8111 Spastic hemiplegia affecting right dominant side: Secondary | ICD-10-CM | POA: Diagnosis not present

## 2021-02-12 DIAGNOSIS — I69352 Hemiplegia and hemiparesis following cerebral infarction affecting left dominant side: Secondary | ICD-10-CM | POA: Diagnosis not present

## 2021-02-12 DIAGNOSIS — M6281 Muscle weakness (generalized): Secondary | ICD-10-CM | POA: Diagnosis not present

## 2021-02-12 DIAGNOSIS — I69054 Hemiplegia and hemiparesis following nontraumatic subarachnoid hemorrhage affecting left non-dominant side: Secondary | ICD-10-CM | POA: Diagnosis not present

## 2021-02-13 DIAGNOSIS — G8111 Spastic hemiplegia affecting right dominant side: Secondary | ICD-10-CM | POA: Diagnosis not present

## 2021-02-13 DIAGNOSIS — I69352 Hemiplegia and hemiparesis following cerebral infarction affecting left dominant side: Secondary | ICD-10-CM | POA: Diagnosis not present

## 2021-02-13 DIAGNOSIS — I69054 Hemiplegia and hemiparesis following nontraumatic subarachnoid hemorrhage affecting left non-dominant side: Secondary | ICD-10-CM | POA: Diagnosis not present

## 2021-02-13 DIAGNOSIS — M6281 Muscle weakness (generalized): Secondary | ICD-10-CM | POA: Diagnosis not present

## 2021-02-13 DIAGNOSIS — M6259 Muscle wasting and atrophy, not elsewhere classified, multiple sites: Secondary | ICD-10-CM | POA: Diagnosis not present

## 2021-02-13 DIAGNOSIS — R296 Repeated falls: Secondary | ICD-10-CM | POA: Diagnosis not present

## 2021-02-14 DIAGNOSIS — E782 Mixed hyperlipidemia: Secondary | ICD-10-CM | POA: Diagnosis not present

## 2021-02-14 DIAGNOSIS — I69352 Hemiplegia and hemiparesis following cerebral infarction affecting left dominant side: Secondary | ICD-10-CM | POA: Diagnosis not present

## 2021-02-14 DIAGNOSIS — I69854 Hemiplegia and hemiparesis following other cerebrovascular disease affecting left non-dominant side: Secondary | ICD-10-CM | POA: Diagnosis not present

## 2021-02-14 DIAGNOSIS — I10 Essential (primary) hypertension: Secondary | ICD-10-CM | POA: Diagnosis not present

## 2021-02-14 DIAGNOSIS — M6281 Muscle weakness (generalized): Secondary | ICD-10-CM | POA: Diagnosis not present

## 2021-02-14 DIAGNOSIS — I2510 Atherosclerotic heart disease of native coronary artery without angina pectoris: Secondary | ICD-10-CM | POA: Diagnosis not present

## 2021-02-14 DIAGNOSIS — G8111 Spastic hemiplegia affecting right dominant side: Secondary | ICD-10-CM | POA: Diagnosis not present

## 2021-02-14 DIAGNOSIS — I2782 Chronic pulmonary embolism: Secondary | ICD-10-CM | POA: Diagnosis not present

## 2021-02-14 DIAGNOSIS — G4709 Other insomnia: Secondary | ICD-10-CM | POA: Diagnosis not present

## 2021-02-14 DIAGNOSIS — J449 Chronic obstructive pulmonary disease, unspecified: Secondary | ICD-10-CM | POA: Diagnosis not present

## 2021-02-14 DIAGNOSIS — K219 Gastro-esophageal reflux disease without esophagitis: Secondary | ICD-10-CM | POA: Diagnosis not present

## 2021-02-14 DIAGNOSIS — R296 Repeated falls: Secondary | ICD-10-CM | POA: Diagnosis not present

## 2021-02-14 DIAGNOSIS — Z7984 Long term (current) use of oral hypoglycemic drugs: Secondary | ICD-10-CM | POA: Diagnosis not present

## 2021-02-14 DIAGNOSIS — I69054 Hemiplegia and hemiparesis following nontraumatic subarachnoid hemorrhage affecting left non-dominant side: Secondary | ICD-10-CM | POA: Diagnosis not present

## 2021-02-14 DIAGNOSIS — E1122 Type 2 diabetes mellitus with diabetic chronic kidney disease: Secondary | ICD-10-CM | POA: Diagnosis not present

## 2021-02-14 DIAGNOSIS — M6259 Muscle wasting and atrophy, not elsewhere classified, multiple sites: Secondary | ICD-10-CM | POA: Diagnosis not present

## 2021-02-18 DIAGNOSIS — I69352 Hemiplegia and hemiparesis following cerebral infarction affecting left dominant side: Secondary | ICD-10-CM | POA: Diagnosis not present

## 2021-02-18 DIAGNOSIS — R296 Repeated falls: Secondary | ICD-10-CM | POA: Diagnosis not present

## 2021-02-18 DIAGNOSIS — G8111 Spastic hemiplegia affecting right dominant side: Secondary | ICD-10-CM | POA: Diagnosis not present

## 2021-02-18 DIAGNOSIS — I69054 Hemiplegia and hemiparesis following nontraumatic subarachnoid hemorrhage affecting left non-dominant side: Secondary | ICD-10-CM | POA: Diagnosis not present

## 2021-02-18 DIAGNOSIS — M6259 Muscle wasting and atrophy, not elsewhere classified, multiple sites: Secondary | ICD-10-CM | POA: Diagnosis not present

## 2021-02-18 DIAGNOSIS — M6281 Muscle weakness (generalized): Secondary | ICD-10-CM | POA: Diagnosis not present

## 2021-02-20 DIAGNOSIS — M6281 Muscle weakness (generalized): Secondary | ICD-10-CM | POA: Diagnosis not present

## 2021-02-20 DIAGNOSIS — G8111 Spastic hemiplegia affecting right dominant side: Secondary | ICD-10-CM | POA: Diagnosis not present

## 2021-02-20 DIAGNOSIS — I69352 Hemiplegia and hemiparesis following cerebral infarction affecting left dominant side: Secondary | ICD-10-CM | POA: Diagnosis not present

## 2021-02-20 DIAGNOSIS — I69054 Hemiplegia and hemiparesis following nontraumatic subarachnoid hemorrhage affecting left non-dominant side: Secondary | ICD-10-CM | POA: Diagnosis not present

## 2021-02-20 DIAGNOSIS — M6259 Muscle wasting and atrophy, not elsewhere classified, multiple sites: Secondary | ICD-10-CM | POA: Diagnosis not present

## 2021-02-20 DIAGNOSIS — R296 Repeated falls: Secondary | ICD-10-CM | POA: Diagnosis not present

## 2021-02-21 DIAGNOSIS — I69054 Hemiplegia and hemiparesis following nontraumatic subarachnoid hemorrhage affecting left non-dominant side: Secondary | ICD-10-CM | POA: Diagnosis not present

## 2021-02-21 DIAGNOSIS — R296 Repeated falls: Secondary | ICD-10-CM | POA: Diagnosis not present

## 2021-02-21 DIAGNOSIS — G8111 Spastic hemiplegia affecting right dominant side: Secondary | ICD-10-CM | POA: Diagnosis not present

## 2021-02-21 DIAGNOSIS — I69352 Hemiplegia and hemiparesis following cerebral infarction affecting left dominant side: Secondary | ICD-10-CM | POA: Diagnosis not present

## 2021-02-21 DIAGNOSIS — M6259 Muscle wasting and atrophy, not elsewhere classified, multiple sites: Secondary | ICD-10-CM | POA: Diagnosis not present

## 2021-02-21 DIAGNOSIS — M6281 Muscle weakness (generalized): Secondary | ICD-10-CM | POA: Diagnosis not present

## 2021-02-25 DIAGNOSIS — I69352 Hemiplegia and hemiparesis following cerebral infarction affecting left dominant side: Secondary | ICD-10-CM | POA: Diagnosis not present

## 2021-02-25 DIAGNOSIS — M6259 Muscle wasting and atrophy, not elsewhere classified, multiple sites: Secondary | ICD-10-CM | POA: Diagnosis not present

## 2021-02-25 DIAGNOSIS — R296 Repeated falls: Secondary | ICD-10-CM | POA: Diagnosis not present

## 2021-02-25 DIAGNOSIS — I69054 Hemiplegia and hemiparesis following nontraumatic subarachnoid hemorrhage affecting left non-dominant side: Secondary | ICD-10-CM | POA: Diagnosis not present

## 2021-02-25 DIAGNOSIS — G8111 Spastic hemiplegia affecting right dominant side: Secondary | ICD-10-CM | POA: Diagnosis not present

## 2021-02-25 DIAGNOSIS — M6281 Muscle weakness (generalized): Secondary | ICD-10-CM | POA: Diagnosis not present

## 2021-02-26 DIAGNOSIS — M6259 Muscle wasting and atrophy, not elsewhere classified, multiple sites: Secondary | ICD-10-CM | POA: Diagnosis not present

## 2021-02-26 DIAGNOSIS — I69054 Hemiplegia and hemiparesis following nontraumatic subarachnoid hemorrhage affecting left non-dominant side: Secondary | ICD-10-CM | POA: Diagnosis not present

## 2021-02-26 DIAGNOSIS — G8111 Spastic hemiplegia affecting right dominant side: Secondary | ICD-10-CM | POA: Diagnosis not present

## 2021-02-26 DIAGNOSIS — M6281 Muscle weakness (generalized): Secondary | ICD-10-CM | POA: Diagnosis not present

## 2021-02-26 DIAGNOSIS — R296 Repeated falls: Secondary | ICD-10-CM | POA: Diagnosis not present

## 2021-02-26 DIAGNOSIS — I69352 Hemiplegia and hemiparesis following cerebral infarction affecting left dominant side: Secondary | ICD-10-CM | POA: Diagnosis not present

## 2021-02-27 DIAGNOSIS — I69352 Hemiplegia and hemiparesis following cerebral infarction affecting left dominant side: Secondary | ICD-10-CM | POA: Diagnosis not present

## 2021-02-27 DIAGNOSIS — R296 Repeated falls: Secondary | ICD-10-CM | POA: Diagnosis not present

## 2021-02-27 DIAGNOSIS — G8111 Spastic hemiplegia affecting right dominant side: Secondary | ICD-10-CM | POA: Diagnosis not present

## 2021-02-27 DIAGNOSIS — I69054 Hemiplegia and hemiparesis following nontraumatic subarachnoid hemorrhage affecting left non-dominant side: Secondary | ICD-10-CM | POA: Diagnosis not present

## 2021-02-27 DIAGNOSIS — M6259 Muscle wasting and atrophy, not elsewhere classified, multiple sites: Secondary | ICD-10-CM | POA: Diagnosis not present

## 2021-02-27 DIAGNOSIS — M6281 Muscle weakness (generalized): Secondary | ICD-10-CM | POA: Diagnosis not present

## 2021-02-28 DIAGNOSIS — I69054 Hemiplegia and hemiparesis following nontraumatic subarachnoid hemorrhage affecting left non-dominant side: Secondary | ICD-10-CM | POA: Diagnosis not present

## 2021-02-28 DIAGNOSIS — M6281 Muscle weakness (generalized): Secondary | ICD-10-CM | POA: Diagnosis not present

## 2021-02-28 DIAGNOSIS — R296 Repeated falls: Secondary | ICD-10-CM | POA: Diagnosis not present

## 2021-02-28 DIAGNOSIS — G8111 Spastic hemiplegia affecting right dominant side: Secondary | ICD-10-CM | POA: Diagnosis not present

## 2021-02-28 DIAGNOSIS — I69352 Hemiplegia and hemiparesis following cerebral infarction affecting left dominant side: Secondary | ICD-10-CM | POA: Diagnosis not present

## 2021-02-28 DIAGNOSIS — M6259 Muscle wasting and atrophy, not elsewhere classified, multiple sites: Secondary | ICD-10-CM | POA: Diagnosis not present

## 2021-03-01 DIAGNOSIS — R296 Repeated falls: Secondary | ICD-10-CM | POA: Diagnosis not present

## 2021-03-01 DIAGNOSIS — I69054 Hemiplegia and hemiparesis following nontraumatic subarachnoid hemorrhage affecting left non-dominant side: Secondary | ICD-10-CM | POA: Diagnosis not present

## 2021-03-01 DIAGNOSIS — M6259 Muscle wasting and atrophy, not elsewhere classified, multiple sites: Secondary | ICD-10-CM | POA: Diagnosis not present

## 2021-03-01 DIAGNOSIS — M6281 Muscle weakness (generalized): Secondary | ICD-10-CM | POA: Diagnosis not present

## 2021-03-01 DIAGNOSIS — G8111 Spastic hemiplegia affecting right dominant side: Secondary | ICD-10-CM | POA: Diagnosis not present

## 2021-03-01 DIAGNOSIS — I69352 Hemiplegia and hemiparesis following cerebral infarction affecting left dominant side: Secondary | ICD-10-CM | POA: Diagnosis not present

## 2021-03-22 DIAGNOSIS — G4709 Other insomnia: Secondary | ICD-10-CM | POA: Diagnosis not present

## 2021-03-22 DIAGNOSIS — I2510 Atherosclerotic heart disease of native coronary artery without angina pectoris: Secondary | ICD-10-CM | POA: Diagnosis not present

## 2021-03-22 DIAGNOSIS — I10 Essential (primary) hypertension: Secondary | ICD-10-CM | POA: Diagnosis not present

## 2021-03-22 DIAGNOSIS — I2782 Chronic pulmonary embolism: Secondary | ICD-10-CM | POA: Diagnosis not present

## 2021-03-22 DIAGNOSIS — Z7984 Long term (current) use of oral hypoglycemic drugs: Secondary | ICD-10-CM | POA: Diagnosis not present

## 2021-03-22 DIAGNOSIS — E1122 Type 2 diabetes mellitus with diabetic chronic kidney disease: Secondary | ICD-10-CM | POA: Diagnosis not present

## 2021-03-22 DIAGNOSIS — J449 Chronic obstructive pulmonary disease, unspecified: Secondary | ICD-10-CM | POA: Diagnosis not present

## 2021-03-22 DIAGNOSIS — E782 Mixed hyperlipidemia: Secondary | ICD-10-CM | POA: Diagnosis not present

## 2021-03-22 DIAGNOSIS — I69854 Hemiplegia and hemiparesis following other cerebrovascular disease affecting left non-dominant side: Secondary | ICD-10-CM | POA: Diagnosis not present

## 2021-03-22 DIAGNOSIS — K219 Gastro-esophageal reflux disease without esophagitis: Secondary | ICD-10-CM | POA: Diagnosis not present

## 2021-03-25 DIAGNOSIS — E785 Hyperlipidemia, unspecified: Secondary | ICD-10-CM | POA: Diagnosis not present

## 2021-03-25 DIAGNOSIS — F5101 Primary insomnia: Secondary | ICD-10-CM | POA: Diagnosis not present

## 2021-03-25 DIAGNOSIS — G8111 Spastic hemiplegia affecting right dominant side: Secondary | ICD-10-CM | POA: Diagnosis not present

## 2021-03-25 DIAGNOSIS — F338 Other recurrent depressive disorders: Secondary | ICD-10-CM | POA: Diagnosis not present

## 2021-03-25 DIAGNOSIS — E1165 Type 2 diabetes mellitus with hyperglycemia: Secondary | ICD-10-CM | POA: Diagnosis not present

## 2021-03-25 DIAGNOSIS — E559 Vitamin D deficiency, unspecified: Secondary | ICD-10-CM | POA: Diagnosis not present

## 2021-04-04 DIAGNOSIS — E782 Mixed hyperlipidemia: Secondary | ICD-10-CM | POA: Diagnosis not present

## 2021-04-04 DIAGNOSIS — G4709 Other insomnia: Secondary | ICD-10-CM | POA: Diagnosis not present

## 2021-04-04 DIAGNOSIS — K219 Gastro-esophageal reflux disease without esophagitis: Secondary | ICD-10-CM | POA: Diagnosis not present

## 2021-04-04 DIAGNOSIS — I10 Essential (primary) hypertension: Secondary | ICD-10-CM | POA: Diagnosis not present

## 2021-04-04 DIAGNOSIS — E1122 Type 2 diabetes mellitus with diabetic chronic kidney disease: Secondary | ICD-10-CM | POA: Diagnosis not present

## 2021-04-04 DIAGNOSIS — I2782 Chronic pulmonary embolism: Secondary | ICD-10-CM | POA: Diagnosis not present

## 2021-04-04 DIAGNOSIS — J449 Chronic obstructive pulmonary disease, unspecified: Secondary | ICD-10-CM | POA: Diagnosis not present

## 2021-04-04 DIAGNOSIS — I2510 Atherosclerotic heart disease of native coronary artery without angina pectoris: Secondary | ICD-10-CM | POA: Diagnosis not present

## 2021-04-04 DIAGNOSIS — I69854 Hemiplegia and hemiparesis following other cerebrovascular disease affecting left non-dominant side: Secondary | ICD-10-CM | POA: Diagnosis not present

## 2021-04-04 DIAGNOSIS — Z7901 Long term (current) use of anticoagulants: Secondary | ICD-10-CM | POA: Diagnosis not present

## 2021-04-04 DIAGNOSIS — Z7984 Long term (current) use of oral hypoglycemic drugs: Secondary | ICD-10-CM | POA: Diagnosis not present

## 2021-04-04 DIAGNOSIS — K5790 Diverticulosis of intestine, part unspecified, without perforation or abscess without bleeding: Secondary | ICD-10-CM | POA: Diagnosis not present

## 2021-04-23 DIAGNOSIS — I2782 Chronic pulmonary embolism: Secondary | ICD-10-CM | POA: Diagnosis not present

## 2021-04-23 DIAGNOSIS — I2510 Atherosclerotic heart disease of native coronary artery without angina pectoris: Secondary | ICD-10-CM | POA: Diagnosis not present

## 2021-04-23 DIAGNOSIS — E782 Mixed hyperlipidemia: Secondary | ICD-10-CM | POA: Diagnosis not present

## 2021-04-23 DIAGNOSIS — J449 Chronic obstructive pulmonary disease, unspecified: Secondary | ICD-10-CM | POA: Diagnosis not present

## 2021-04-23 DIAGNOSIS — Z7901 Long term (current) use of anticoagulants: Secondary | ICD-10-CM | POA: Diagnosis not present

## 2021-04-23 DIAGNOSIS — I69854 Hemiplegia and hemiparesis following other cerebrovascular disease affecting left non-dominant side: Secondary | ICD-10-CM | POA: Diagnosis not present

## 2021-04-23 DIAGNOSIS — Z7984 Long term (current) use of oral hypoglycemic drugs: Secondary | ICD-10-CM | POA: Diagnosis not present

## 2021-04-23 DIAGNOSIS — I10 Essential (primary) hypertension: Secondary | ICD-10-CM | POA: Diagnosis not present

## 2021-04-23 DIAGNOSIS — K5790 Diverticulosis of intestine, part unspecified, without perforation or abscess without bleeding: Secondary | ICD-10-CM | POA: Diagnosis not present

## 2021-04-23 DIAGNOSIS — K219 Gastro-esophageal reflux disease without esophagitis: Secondary | ICD-10-CM | POA: Diagnosis not present

## 2021-04-23 DIAGNOSIS — E1122 Type 2 diabetes mellitus with diabetic chronic kidney disease: Secondary | ICD-10-CM | POA: Diagnosis not present

## 2021-04-23 DIAGNOSIS — G4709 Other insomnia: Secondary | ICD-10-CM | POA: Diagnosis not present

## 2021-04-24 DIAGNOSIS — I2699 Other pulmonary embolism without acute cor pulmonale: Secondary | ICD-10-CM | POA: Diagnosis not present

## 2021-04-24 DIAGNOSIS — E785 Hyperlipidemia, unspecified: Secondary | ICD-10-CM | POA: Diagnosis not present

## 2021-05-06 DIAGNOSIS — F5101 Primary insomnia: Secondary | ICD-10-CM | POA: Diagnosis not present

## 2021-05-06 DIAGNOSIS — F338 Other recurrent depressive disorders: Secondary | ICD-10-CM | POA: Diagnosis not present

## 2021-05-14 DIAGNOSIS — I69054 Hemiplegia and hemiparesis following nontraumatic subarachnoid hemorrhage affecting left non-dominant side: Secondary | ICD-10-CM | POA: Diagnosis not present

## 2021-05-14 DIAGNOSIS — M24571 Contracture, right ankle: Secondary | ICD-10-CM | POA: Diagnosis not present

## 2021-05-14 DIAGNOSIS — R1312 Dysphagia, oropharyngeal phase: Secondary | ICD-10-CM | POA: Diagnosis not present

## 2021-05-14 DIAGNOSIS — M6281 Muscle weakness (generalized): Secondary | ICD-10-CM | POA: Diagnosis not present

## 2021-05-14 DIAGNOSIS — R296 Repeated falls: Secondary | ICD-10-CM | POA: Diagnosis not present

## 2021-05-14 DIAGNOSIS — R1311 Dysphagia, oral phase: Secondary | ICD-10-CM | POA: Diagnosis not present

## 2021-05-14 DIAGNOSIS — R278 Other lack of coordination: Secondary | ICD-10-CM | POA: Diagnosis not present

## 2021-05-14 DIAGNOSIS — I69352 Hemiplegia and hemiparesis following cerebral infarction affecting left dominant side: Secondary | ICD-10-CM | POA: Diagnosis not present

## 2021-05-14 DIAGNOSIS — M24572 Contracture, left ankle: Secondary | ICD-10-CM | POA: Diagnosis not present

## 2021-05-14 DIAGNOSIS — G8111 Spastic hemiplegia affecting right dominant side: Secondary | ICD-10-CM | POA: Diagnosis not present

## 2021-05-14 DIAGNOSIS — R41841 Cognitive communication deficit: Secondary | ICD-10-CM | POA: Diagnosis not present

## 2021-05-14 DIAGNOSIS — I6340 Cerebral infarction due to embolism of unspecified cerebral artery: Secondary | ICD-10-CM | POA: Diagnosis not present

## 2021-05-14 DIAGNOSIS — M6259 Muscle wasting and atrophy, not elsewhere classified, multiple sites: Secondary | ICD-10-CM | POA: Diagnosis not present

## 2021-05-14 DIAGNOSIS — I69391 Dysphagia following cerebral infarction: Secondary | ICD-10-CM | POA: Diagnosis not present

## 2021-05-14 DIAGNOSIS — R4701 Aphasia: Secondary | ICD-10-CM | POA: Diagnosis not present

## 2021-05-15 DIAGNOSIS — M6281 Muscle weakness (generalized): Secondary | ICD-10-CM | POA: Diagnosis not present

## 2021-05-15 DIAGNOSIS — I69054 Hemiplegia and hemiparesis following nontraumatic subarachnoid hemorrhage affecting left non-dominant side: Secondary | ICD-10-CM | POA: Diagnosis not present

## 2021-05-15 DIAGNOSIS — I69352 Hemiplegia and hemiparesis following cerebral infarction affecting left dominant side: Secondary | ICD-10-CM | POA: Diagnosis not present

## 2021-05-15 DIAGNOSIS — R296 Repeated falls: Secondary | ICD-10-CM | POA: Diagnosis not present

## 2021-05-15 DIAGNOSIS — M6259 Muscle wasting and atrophy, not elsewhere classified, multiple sites: Secondary | ICD-10-CM | POA: Diagnosis not present

## 2021-05-15 DIAGNOSIS — G8111 Spastic hemiplegia affecting right dominant side: Secondary | ICD-10-CM | POA: Diagnosis not present

## 2021-05-16 DIAGNOSIS — M6259 Muscle wasting and atrophy, not elsewhere classified, multiple sites: Secondary | ICD-10-CM | POA: Diagnosis not present

## 2021-05-16 DIAGNOSIS — I69352 Hemiplegia and hemiparesis following cerebral infarction affecting left dominant side: Secondary | ICD-10-CM | POA: Diagnosis not present

## 2021-05-16 DIAGNOSIS — M6281 Muscle weakness (generalized): Secondary | ICD-10-CM | POA: Diagnosis not present

## 2021-05-16 DIAGNOSIS — I69054 Hemiplegia and hemiparesis following nontraumatic subarachnoid hemorrhage affecting left non-dominant side: Secondary | ICD-10-CM | POA: Diagnosis not present

## 2021-05-16 DIAGNOSIS — G8111 Spastic hemiplegia affecting right dominant side: Secondary | ICD-10-CM | POA: Diagnosis not present

## 2021-05-16 DIAGNOSIS — R296 Repeated falls: Secondary | ICD-10-CM | POA: Diagnosis not present

## 2021-05-19 DIAGNOSIS — M6259 Muscle wasting and atrophy, not elsewhere classified, multiple sites: Secondary | ICD-10-CM | POA: Diagnosis not present

## 2021-05-19 DIAGNOSIS — G8111 Spastic hemiplegia affecting right dominant side: Secondary | ICD-10-CM | POA: Diagnosis not present

## 2021-05-19 DIAGNOSIS — R296 Repeated falls: Secondary | ICD-10-CM | POA: Diagnosis not present

## 2021-05-19 DIAGNOSIS — I69054 Hemiplegia and hemiparesis following nontraumatic subarachnoid hemorrhage affecting left non-dominant side: Secondary | ICD-10-CM | POA: Diagnosis not present

## 2021-05-19 DIAGNOSIS — M6281 Muscle weakness (generalized): Secondary | ICD-10-CM | POA: Diagnosis not present

## 2021-05-19 DIAGNOSIS — I69352 Hemiplegia and hemiparesis following cerebral infarction affecting left dominant side: Secondary | ICD-10-CM | POA: Diagnosis not present

## 2021-05-20 DIAGNOSIS — I69054 Hemiplegia and hemiparesis following nontraumatic subarachnoid hemorrhage affecting left non-dominant side: Secondary | ICD-10-CM | POA: Diagnosis not present

## 2021-05-20 DIAGNOSIS — M6281 Muscle weakness (generalized): Secondary | ICD-10-CM | POA: Diagnosis not present

## 2021-05-20 DIAGNOSIS — M6259 Muscle wasting and atrophy, not elsewhere classified, multiple sites: Secondary | ICD-10-CM | POA: Diagnosis not present

## 2021-05-20 DIAGNOSIS — I69352 Hemiplegia and hemiparesis following cerebral infarction affecting left dominant side: Secondary | ICD-10-CM | POA: Diagnosis not present

## 2021-05-20 DIAGNOSIS — G8111 Spastic hemiplegia affecting right dominant side: Secondary | ICD-10-CM | POA: Diagnosis not present

## 2021-05-20 DIAGNOSIS — R296 Repeated falls: Secondary | ICD-10-CM | POA: Diagnosis not present

## 2021-06-03 DIAGNOSIS — F338 Other recurrent depressive disorders: Secondary | ICD-10-CM | POA: Diagnosis not present

## 2021-06-03 DIAGNOSIS — F5101 Primary insomnia: Secondary | ICD-10-CM | POA: Diagnosis not present

## 2021-06-04 DIAGNOSIS — E1140 Type 2 diabetes mellitus with diabetic neuropathy, unspecified: Secondary | ICD-10-CM | POA: Diagnosis not present

## 2021-06-04 DIAGNOSIS — Z7984 Long term (current) use of oral hypoglycemic drugs: Secondary | ICD-10-CM | POA: Diagnosis not present

## 2021-06-04 DIAGNOSIS — B351 Tinea unguium: Secondary | ICD-10-CM | POA: Diagnosis not present

## 2021-06-04 DIAGNOSIS — L603 Nail dystrophy: Secondary | ICD-10-CM | POA: Diagnosis not present

## 2021-06-05 DIAGNOSIS — I69854 Hemiplegia and hemiparesis following other cerebrovascular disease affecting left non-dominant side: Secondary | ICD-10-CM | POA: Diagnosis not present

## 2021-06-05 DIAGNOSIS — K5790 Diverticulosis of intestine, part unspecified, without perforation or abscess without bleeding: Secondary | ICD-10-CM | POA: Diagnosis not present

## 2021-06-05 DIAGNOSIS — E1122 Type 2 diabetes mellitus with diabetic chronic kidney disease: Secondary | ICD-10-CM | POA: Diagnosis not present

## 2021-06-05 DIAGNOSIS — E782 Mixed hyperlipidemia: Secondary | ICD-10-CM | POA: Diagnosis not present

## 2021-06-05 DIAGNOSIS — G4709 Other insomnia: Secondary | ICD-10-CM | POA: Diagnosis not present

## 2021-06-05 DIAGNOSIS — I2510 Atherosclerotic heart disease of native coronary artery without angina pectoris: Secondary | ICD-10-CM | POA: Diagnosis not present

## 2021-06-05 DIAGNOSIS — J449 Chronic obstructive pulmonary disease, unspecified: Secondary | ICD-10-CM | POA: Diagnosis not present

## 2021-06-05 DIAGNOSIS — I10 Essential (primary) hypertension: Secondary | ICD-10-CM | POA: Diagnosis not present

## 2021-06-05 DIAGNOSIS — I2782 Chronic pulmonary embolism: Secondary | ICD-10-CM | POA: Diagnosis not present

## 2021-06-05 DIAGNOSIS — K219 Gastro-esophageal reflux disease without esophagitis: Secondary | ICD-10-CM | POA: Diagnosis not present

## 2021-06-06 DIAGNOSIS — I10 Essential (primary) hypertension: Secondary | ICD-10-CM | POA: Diagnosis not present

## 2021-06-06 DIAGNOSIS — Z7901 Long term (current) use of anticoagulants: Secondary | ICD-10-CM | POA: Diagnosis not present

## 2021-06-06 DIAGNOSIS — I2782 Chronic pulmonary embolism: Secondary | ICD-10-CM | POA: Diagnosis not present

## 2021-06-06 DIAGNOSIS — I2510 Atherosclerotic heart disease of native coronary artery without angina pectoris: Secondary | ICD-10-CM | POA: Diagnosis not present

## 2021-06-06 DIAGNOSIS — J449 Chronic obstructive pulmonary disease, unspecified: Secondary | ICD-10-CM | POA: Diagnosis not present

## 2021-06-06 DIAGNOSIS — I69854 Hemiplegia and hemiparesis following other cerebrovascular disease affecting left non-dominant side: Secondary | ICD-10-CM | POA: Diagnosis not present

## 2021-06-06 DIAGNOSIS — E1122 Type 2 diabetes mellitus with diabetic chronic kidney disease: Secondary | ICD-10-CM | POA: Diagnosis not present

## 2021-06-06 DIAGNOSIS — K219 Gastro-esophageal reflux disease without esophagitis: Secondary | ICD-10-CM | POA: Diagnosis not present

## 2021-06-06 DIAGNOSIS — Z7984 Long term (current) use of oral hypoglycemic drugs: Secondary | ICD-10-CM | POA: Diagnosis not present

## 2021-07-09 DIAGNOSIS — K219 Gastro-esophageal reflux disease without esophagitis: Secondary | ICD-10-CM | POA: Diagnosis not present

## 2021-07-09 DIAGNOSIS — I2782 Chronic pulmonary embolism: Secondary | ICD-10-CM | POA: Diagnosis not present

## 2021-07-09 DIAGNOSIS — I2510 Atherosclerotic heart disease of native coronary artery without angina pectoris: Secondary | ICD-10-CM | POA: Diagnosis not present

## 2021-07-09 DIAGNOSIS — K5790 Diverticulosis of intestine, part unspecified, without perforation or abscess without bleeding: Secondary | ICD-10-CM | POA: Diagnosis not present

## 2021-07-09 DIAGNOSIS — F445 Conversion disorder with seizures or convulsions: Secondary | ICD-10-CM | POA: Diagnosis not present

## 2021-07-09 DIAGNOSIS — J449 Chronic obstructive pulmonary disease, unspecified: Secondary | ICD-10-CM | POA: Diagnosis not present

## 2021-07-09 DIAGNOSIS — E1122 Type 2 diabetes mellitus with diabetic chronic kidney disease: Secondary | ICD-10-CM | POA: Diagnosis not present

## 2021-07-09 DIAGNOSIS — E782 Mixed hyperlipidemia: Secondary | ICD-10-CM | POA: Diagnosis not present

## 2021-07-09 DIAGNOSIS — F32A Depression, unspecified: Secondary | ICD-10-CM | POA: Diagnosis not present

## 2021-07-09 DIAGNOSIS — I69854 Hemiplegia and hemiparesis following other cerebrovascular disease affecting left non-dominant side: Secondary | ICD-10-CM | POA: Diagnosis not present

## 2021-07-09 DIAGNOSIS — I10 Essential (primary) hypertension: Secondary | ICD-10-CM | POA: Diagnosis not present

## 2021-07-15 DIAGNOSIS — F338 Other recurrent depressive disorders: Secondary | ICD-10-CM | POA: Diagnosis not present

## 2021-07-15 DIAGNOSIS — F5101 Primary insomnia: Secondary | ICD-10-CM | POA: Diagnosis not present

## 2021-08-08 DIAGNOSIS — I69391 Dysphagia following cerebral infarction: Secondary | ICD-10-CM | POA: Diagnosis not present

## 2021-08-08 DIAGNOSIS — M6259 Muscle wasting and atrophy, not elsewhere classified, multiple sites: Secondary | ICD-10-CM | POA: Diagnosis not present

## 2021-08-08 DIAGNOSIS — M24542 Contracture, left hand: Secondary | ICD-10-CM | POA: Diagnosis not present

## 2021-08-08 DIAGNOSIS — R278 Other lack of coordination: Secondary | ICD-10-CM | POA: Diagnosis not present

## 2021-08-08 DIAGNOSIS — I6340 Cerebral infarction due to embolism of unspecified cerebral artery: Secondary | ICD-10-CM | POA: Diagnosis not present

## 2021-08-08 DIAGNOSIS — M24571 Contracture, right ankle: Secondary | ICD-10-CM | POA: Diagnosis not present

## 2021-08-08 DIAGNOSIS — M24572 Contracture, left ankle: Secondary | ICD-10-CM | POA: Diagnosis not present

## 2021-08-08 DIAGNOSIS — G8111 Spastic hemiplegia affecting right dominant side: Secondary | ICD-10-CM | POA: Diagnosis not present

## 2021-08-08 DIAGNOSIS — I69352 Hemiplegia and hemiparesis following cerebral infarction affecting left dominant side: Secondary | ICD-10-CM | POA: Diagnosis not present

## 2021-08-08 DIAGNOSIS — I69054 Hemiplegia and hemiparesis following nontraumatic subarachnoid hemorrhage affecting left non-dominant side: Secondary | ICD-10-CM | POA: Diagnosis not present

## 2021-08-08 DIAGNOSIS — R1311 Dysphagia, oral phase: Secondary | ICD-10-CM | POA: Diagnosis not present

## 2021-08-08 DIAGNOSIS — M6281 Muscle weakness (generalized): Secondary | ICD-10-CM | POA: Diagnosis not present

## 2021-08-08 DIAGNOSIS — R4701 Aphasia: Secondary | ICD-10-CM | POA: Diagnosis not present

## 2021-08-08 DIAGNOSIS — R41841 Cognitive communication deficit: Secondary | ICD-10-CM | POA: Diagnosis not present

## 2021-08-08 DIAGNOSIS — R1312 Dysphagia, oropharyngeal phase: Secondary | ICD-10-CM | POA: Diagnosis not present

## 2021-08-08 DIAGNOSIS — R296 Repeated falls: Secondary | ICD-10-CM | POA: Diagnosis not present

## 2021-08-09 DIAGNOSIS — E1122 Type 2 diabetes mellitus with diabetic chronic kidney disease: Secondary | ICD-10-CM | POA: Diagnosis not present

## 2021-08-09 DIAGNOSIS — I10 Essential (primary) hypertension: Secondary | ICD-10-CM | POA: Diagnosis not present

## 2021-08-09 DIAGNOSIS — G8111 Spastic hemiplegia affecting right dominant side: Secondary | ICD-10-CM | POA: Diagnosis not present

## 2021-08-09 DIAGNOSIS — J449 Chronic obstructive pulmonary disease, unspecified: Secondary | ICD-10-CM | POA: Diagnosis not present

## 2021-08-09 DIAGNOSIS — I69054 Hemiplegia and hemiparesis following nontraumatic subarachnoid hemorrhage affecting left non-dominant side: Secondary | ICD-10-CM | POA: Diagnosis not present

## 2021-08-09 DIAGNOSIS — M24542 Contracture, left hand: Secondary | ICD-10-CM | POA: Diagnosis not present

## 2021-08-09 DIAGNOSIS — F445 Conversion disorder with seizures or convulsions: Secondary | ICD-10-CM | POA: Diagnosis not present

## 2021-08-09 DIAGNOSIS — I6340 Cerebral infarction due to embolism of unspecified cerebral artery: Secondary | ICD-10-CM | POA: Diagnosis not present

## 2021-08-09 DIAGNOSIS — R296 Repeated falls: Secondary | ICD-10-CM | POA: Diagnosis not present

## 2021-08-09 DIAGNOSIS — I69352 Hemiplegia and hemiparesis following cerebral infarction affecting left dominant side: Secondary | ICD-10-CM | POA: Diagnosis not present

## 2021-08-09 DIAGNOSIS — N182 Chronic kidney disease, stage 2 (mild): Secondary | ICD-10-CM | POA: Diagnosis not present

## 2022-01-23 IMAGING — MR MR HEAD W/O CM
7 of 8 series · 38 of 48 positions shown · non-contrast
Comparison: Brain MRI 09/11/2019 and earlier.

CLINICAL DATA: 68-year-old female status post widespread scattered
and watershed infarcts in both cerebral hemispheres last month. New
onset left side neglect and right side gaze while in therapy.

EXAM:
MRI HEAD WITHOUT CONTRAST
TECHNIQUE: Multiplanar, multiecho pulse sequences of the brain and surrounding
structures were obtained without intravenous contrast.

[Series 3: DWI · axial · 3.0mm · 1.09mm/px · z∈[+15,+155]mm · 8 of 96 slices shown (1 of 4)]
[im 1/96]
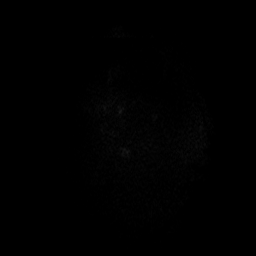
[im 15/96]
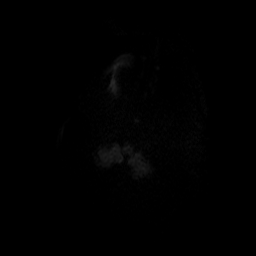
[im 30/96]
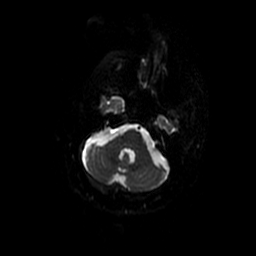
[im 44/96]
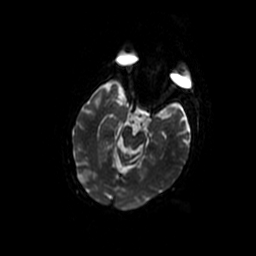
[im 52/96]
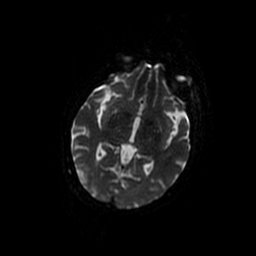
[im 66/96]
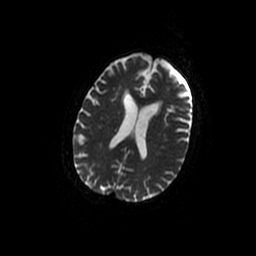
[im 81/96]
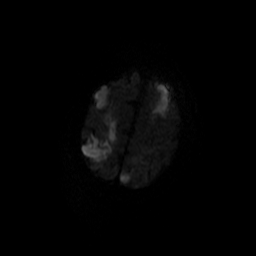
[im 96/96]
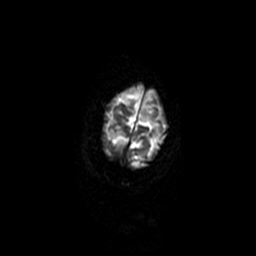

[Series 5: T2 · axial · 5.0mm · 0.43mm/px · z∈[+12,+149]mm · 3 of 24 slices shown (1 of 2)]
[im 1/24]
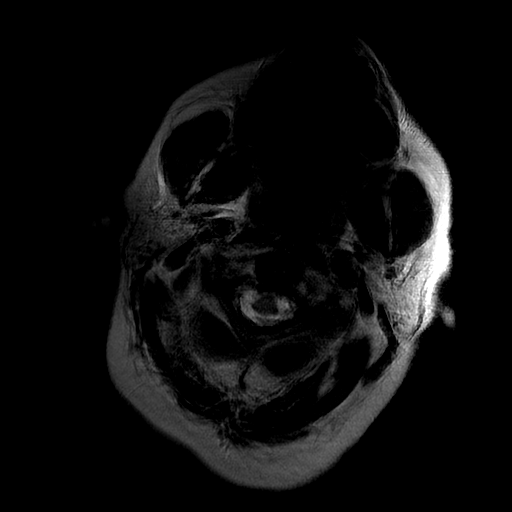
[im 12/24]
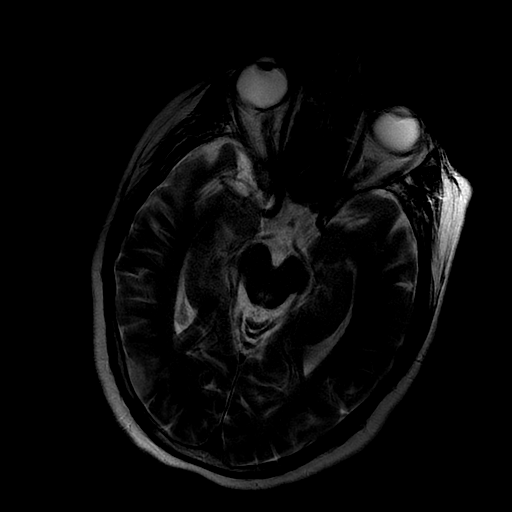
[im 24/24]
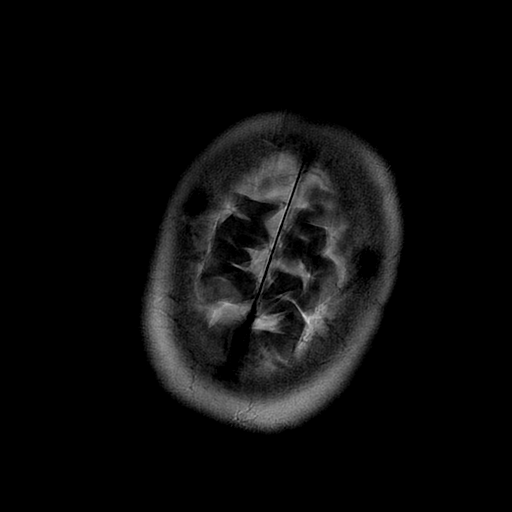

[Series 9: FLAIR · axial · 5.0mm · 0.43mm/px · z∈[+12,+149]mm · 3 of 24 slices shown]
[im 1/24]
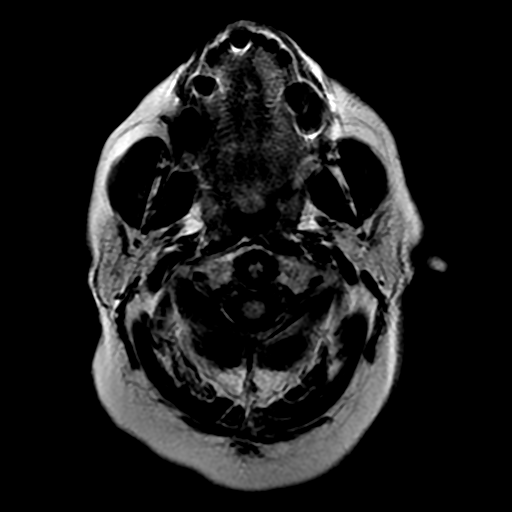
[im 12/24]
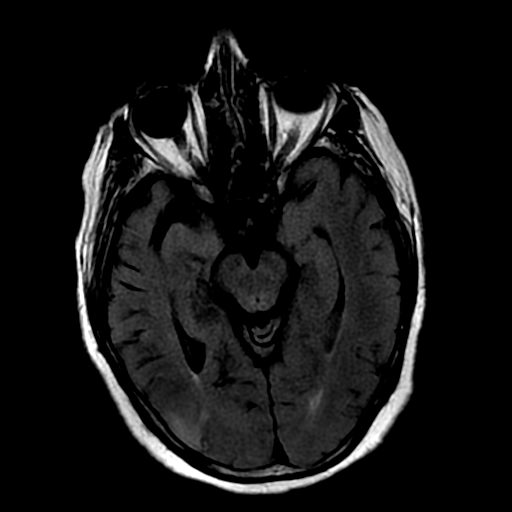
[im 24/24]
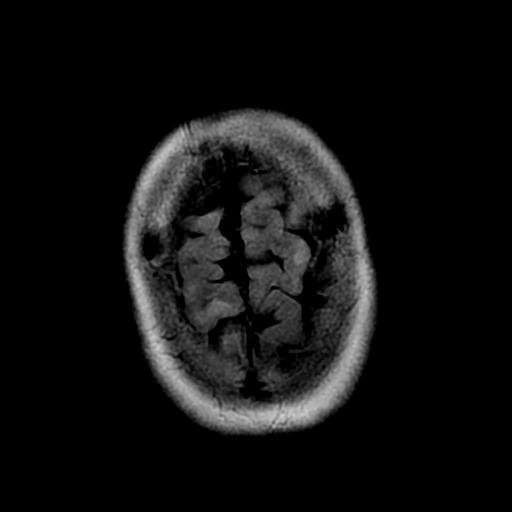

[Series 10: DWI · coronal · 5.0mm · 1.09mm/px · 9 of 68 slices shown (2 of 4)]
[im 1/68]
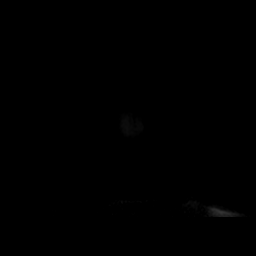
[im 9/68]
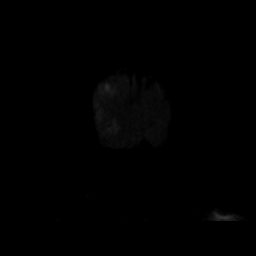
[im 17/68]
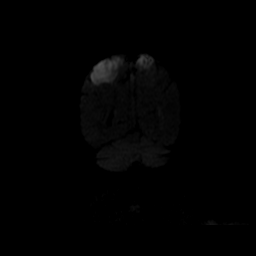
[im 26/68]
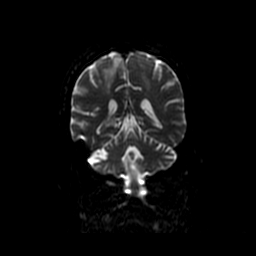
[im 34/68]
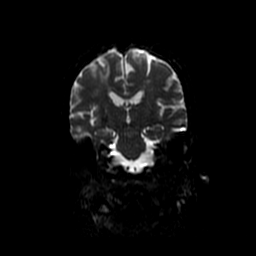
[im 42/68]
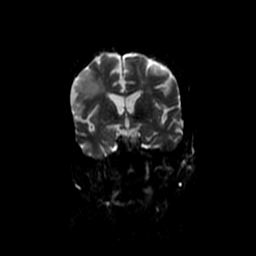
[im 51/68]
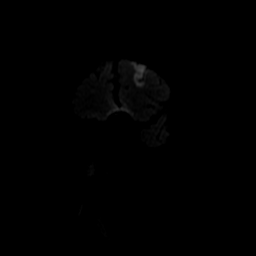
[im 59/68]
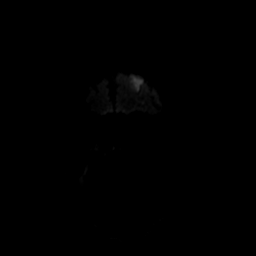
[im 68/68]
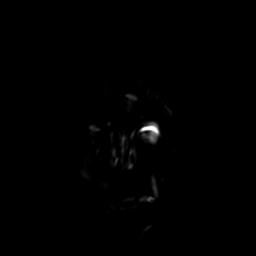

[Series 11: T2 · coronal · 5.0mm · 0.43mm/px · 3 of 27 slices shown (2 of 2)]
[im 1/27]
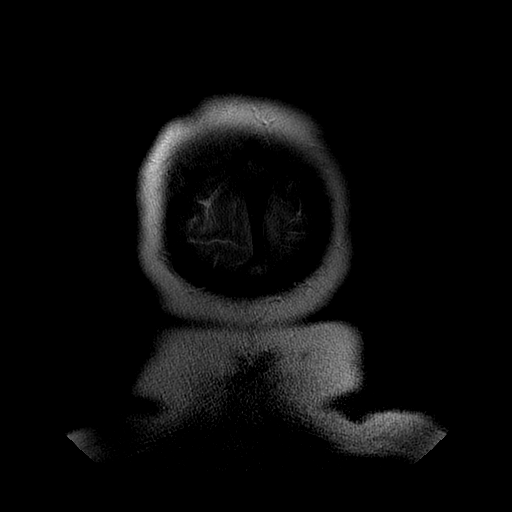
[im 9/27]
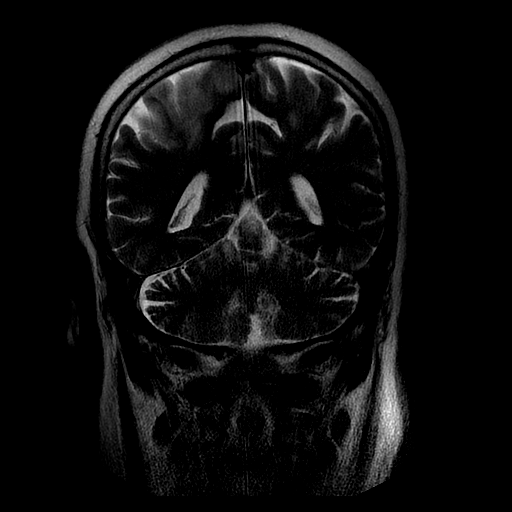
[im 18/27]
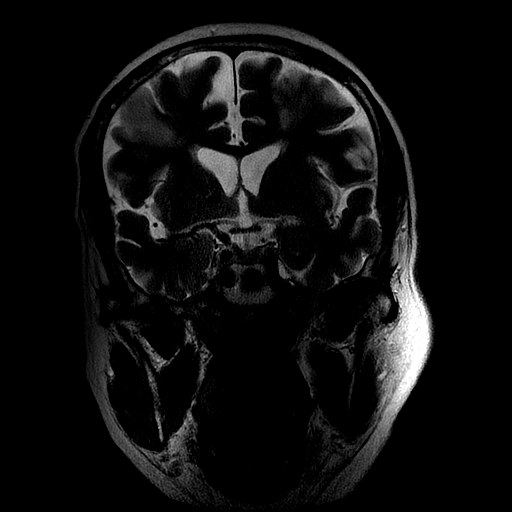

[Series 300: DWI · axial · 3.0mm · 1.09mm/px · z∈[+15,+155]mm · 7 of 48 slices shown (3 of 4)]
[im 1/48]
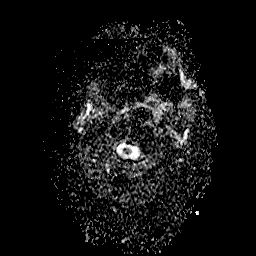
[im 8/48]
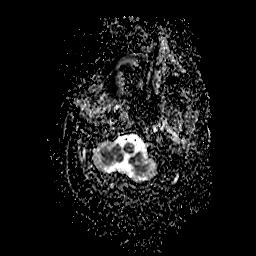
[im 16/48]
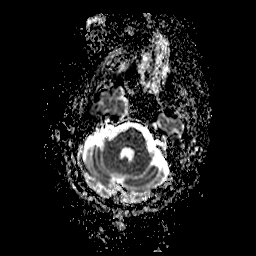
[im 24/48]
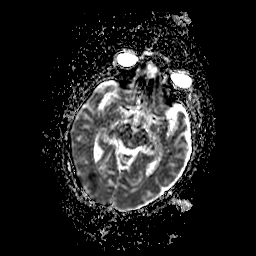
[im 32/48]
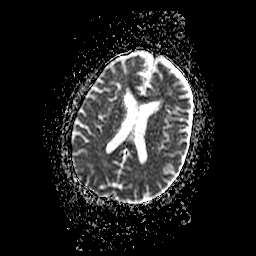
[im 40/48]
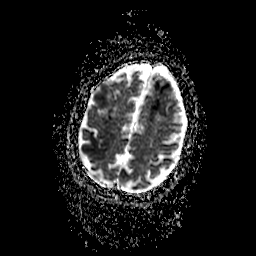
[im 48/48]
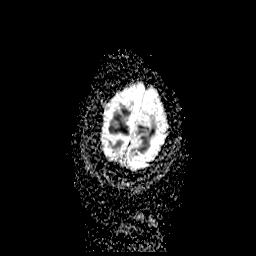

[Series 1000: DWI · coronal · 5.0mm · 1.09mm/px · 5 of 34 slices shown (4 of 4)]
[im 1/34]
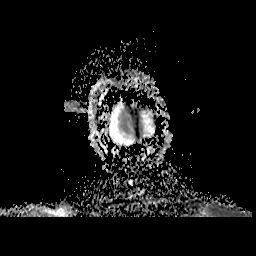
[im 9/34]
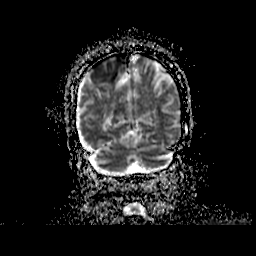
[im 17/34]
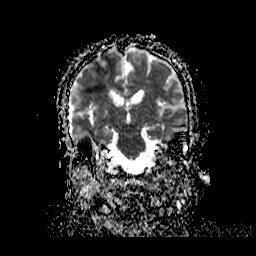
[im 25/34]
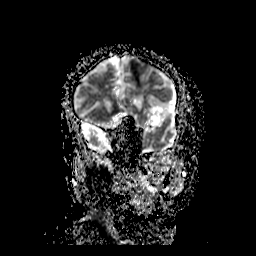
[im 34/34]
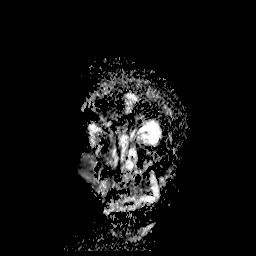

[38 of 48 positions shown; findings below may reference images not displayed]

FINDINGS: The examination had to be discontinued prior to completion due to
patient altered mental status. Diagnostic diffusion-weighted imaging
plus axial T2, FLAIR, sagittal T1 and coronal T2 weighted imaging
was obtained.

Brain: Confluent new areas of restricted diffusion in the right
middle and bilateral superior frontal gyri when compared to
09/11/2019 (series 3, image 40). Confluent superior right
perirolandic involvement.

Much smaller new or increased area of right lateral occipital lobe
diffusion restriction on series 3, image 26.

No deep gray nuclei, brainstem or cerebellar involvement.

Confluent cytotoxic edema in the new areas of ischemia. No midline
shift or significant intracranial mass effect. Basilar cisterns
remain patent. No ventriculomegaly. Expected evolution of the
Ricoy ischemic disease. No new signal abnormality in the deep gray
nuclei, brainstem or cerebellum. Pituitary and cervicomedullary
junction appear to remain normal.

Vascular: Major intracranial vascular flow voids are preserved and
stable.

Skull and upper cervical spine: Grossly negative visible cervical
spine. Normal bone marrow signal.

Sinuses/Orbits: Negative orbits. Paranasal sinuses and mastoids are
stable and well pneumatized.

Other: Scalp and face soft tissues appear negative.
IMPRESSION: 1. Confluent new areas of acute/subacute infarction in the bilateral
superior frontal gyri, more extensive on the right. Much smaller new
area of right lateral occipital lobe infarction.
2. Cytotoxic edema with no evidence of hemorrhage hemorrhage, and no
significant intracranial mass effect.

3. The examination had to be discontinued prior to completion due to
patient altered mental status.

## 2022-02-03 ENCOUNTER — Encounter: Payer: Self-pay | Admitting: Gastroenterology

## 2022-09-22 ENCOUNTER — Other Ambulatory Visit: Payer: Self-pay | Admitting: Family Medicine

## 2022-09-22 DIAGNOSIS — Z1231 Encounter for screening mammogram for malignant neoplasm of breast: Secondary | ICD-10-CM
